# Patient Record
Sex: Male | Born: 2000 | Race: Black or African American | Hispanic: No | Marital: Single | Smoking: Never smoker
Health system: Southern US, Community
[De-identification: ages and names within clinical notes are randomized; demographics above are authoritative.]

## PROBLEM LIST (undated history)

## (undated) DIAGNOSIS — R519 Headache, unspecified: Secondary | ICD-10-CM

## (undated) DIAGNOSIS — T8859XA Other complications of anesthesia, initial encounter: Secondary | ICD-10-CM

## (undated) DIAGNOSIS — S022XXA Fracture of nasal bones, initial encounter for closed fracture: Secondary | ICD-10-CM

## (undated) DIAGNOSIS — Z8489 Family history of other specified conditions: Secondary | ICD-10-CM

## (undated) DIAGNOSIS — T7840XA Allergy, unspecified, initial encounter: Secondary | ICD-10-CM

## (undated) DIAGNOSIS — K200 Eosinophilic esophagitis: Secondary | ICD-10-CM

## (undated) DIAGNOSIS — F329 Major depressive disorder, single episode, unspecified: Secondary | ICD-10-CM

## (undated) DIAGNOSIS — R569 Unspecified convulsions: Secondary | ICD-10-CM

## (undated) DIAGNOSIS — J189 Pneumonia, unspecified organism: Secondary | ICD-10-CM

## (undated) DIAGNOSIS — J45909 Unspecified asthma, uncomplicated: Secondary | ICD-10-CM

## (undated) HISTORY — PX: TONSILLECTOMY: SUR1361

## (undated) HISTORY — PX: ADENOIDECTOMY: SUR15

---
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
## 2019-01-16 ENCOUNTER — Encounter (HOSPITAL_COMMUNITY): Payer: Self-pay | Admitting: Family Medicine

## 2019-01-16 ENCOUNTER — Other Ambulatory Visit: Payer: Self-pay

## 2019-01-16 DIAGNOSIS — Z79899 Other long term (current) drug therapy: Secondary | ICD-10-CM | POA: Insufficient documentation

## 2019-01-16 DIAGNOSIS — X503XXA Overexertion from repetitive movements, initial encounter: Secondary | ICD-10-CM | POA: Diagnosis not present

## 2019-01-16 DIAGNOSIS — S93401A Sprain of unspecified ligament of right ankle, initial encounter: Secondary | ICD-10-CM | POA: Insufficient documentation

## 2019-01-16 DIAGNOSIS — Y999 Unspecified external cause status: Secondary | ICD-10-CM | POA: Insufficient documentation

## 2019-01-16 DIAGNOSIS — J45909 Unspecified asthma, uncomplicated: Secondary | ICD-10-CM | POA: Diagnosis not present

## 2019-01-16 DIAGNOSIS — Y92310 Basketball court as the place of occurrence of the external cause: Secondary | ICD-10-CM | POA: Insufficient documentation

## 2019-01-16 DIAGNOSIS — Y9367 Activity, basketball: Secondary | ICD-10-CM | POA: Insufficient documentation

## 2019-01-16 DIAGNOSIS — S99911A Unspecified injury of right ankle, initial encounter: Secondary | ICD-10-CM | POA: Diagnosis present

## 2019-01-16 NOTE — ED Triage Notes (Signed)
Patient states he was playing basketball when he injured his right ankle. This occurred today. Reports numbness and tingling in the foot. Patient states he is ambulatory but it hurts.

## 2019-01-17 ENCOUNTER — Emergency Department (HOSPITAL_COMMUNITY)
Admission: EM | Admit: 2019-01-17 | Discharge: 2019-01-17 | Disposition: A | Discharge status: Home / Self Care | Payer: Commercial Managed Care - PPO | Attending: Emergency Medicine | Admitting: Emergency Medicine

## 2019-01-17 ENCOUNTER — Emergency Department (HOSPITAL_COMMUNITY): Payer: Commercial Managed Care - PPO

## 2019-01-17 DIAGNOSIS — S93401A Sprain of unspecified ligament of right ankle, initial encounter: Secondary | ICD-10-CM | POA: Diagnosis not present

## 2019-01-17 IMAGING — CR RIGHT ANKLE - COMPLETE 3+ VIEW
3 series · 3 of 3 positions shown · non-contrast
Comparison: None.

CLINICAL DATA: Right ankle pain and swelling after injury playing
basketball.

EXAM:
RIGHT ANKLE - COMPLETE 3+ VIEW

[x ankle ap right]
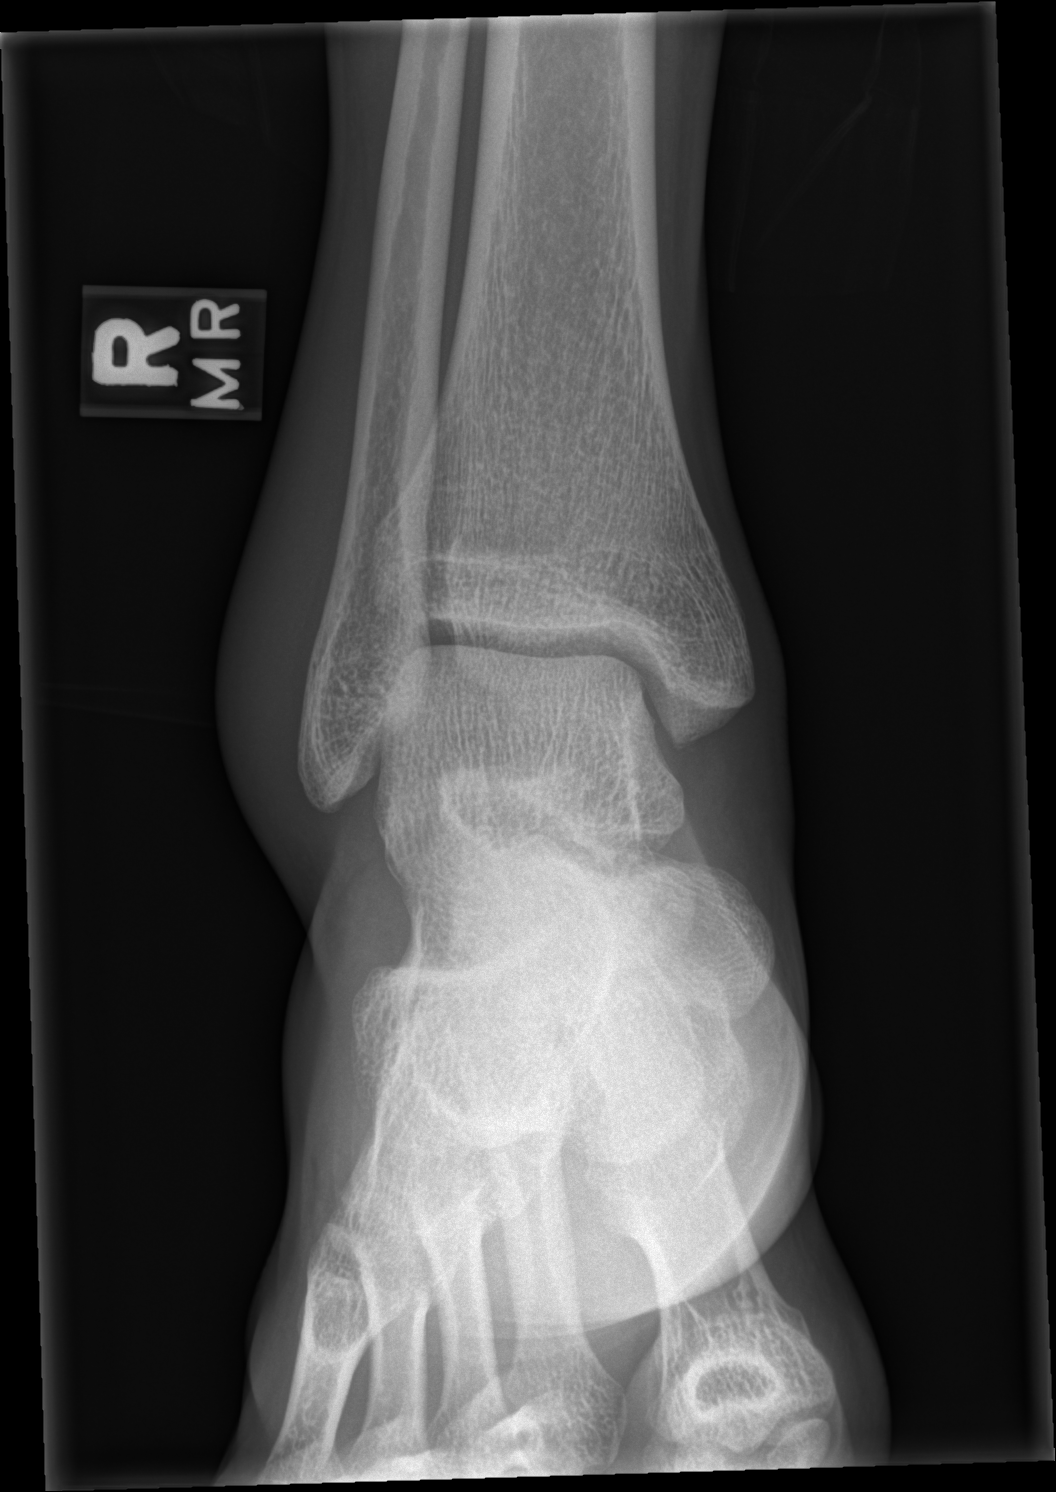

[x ankle obl right]
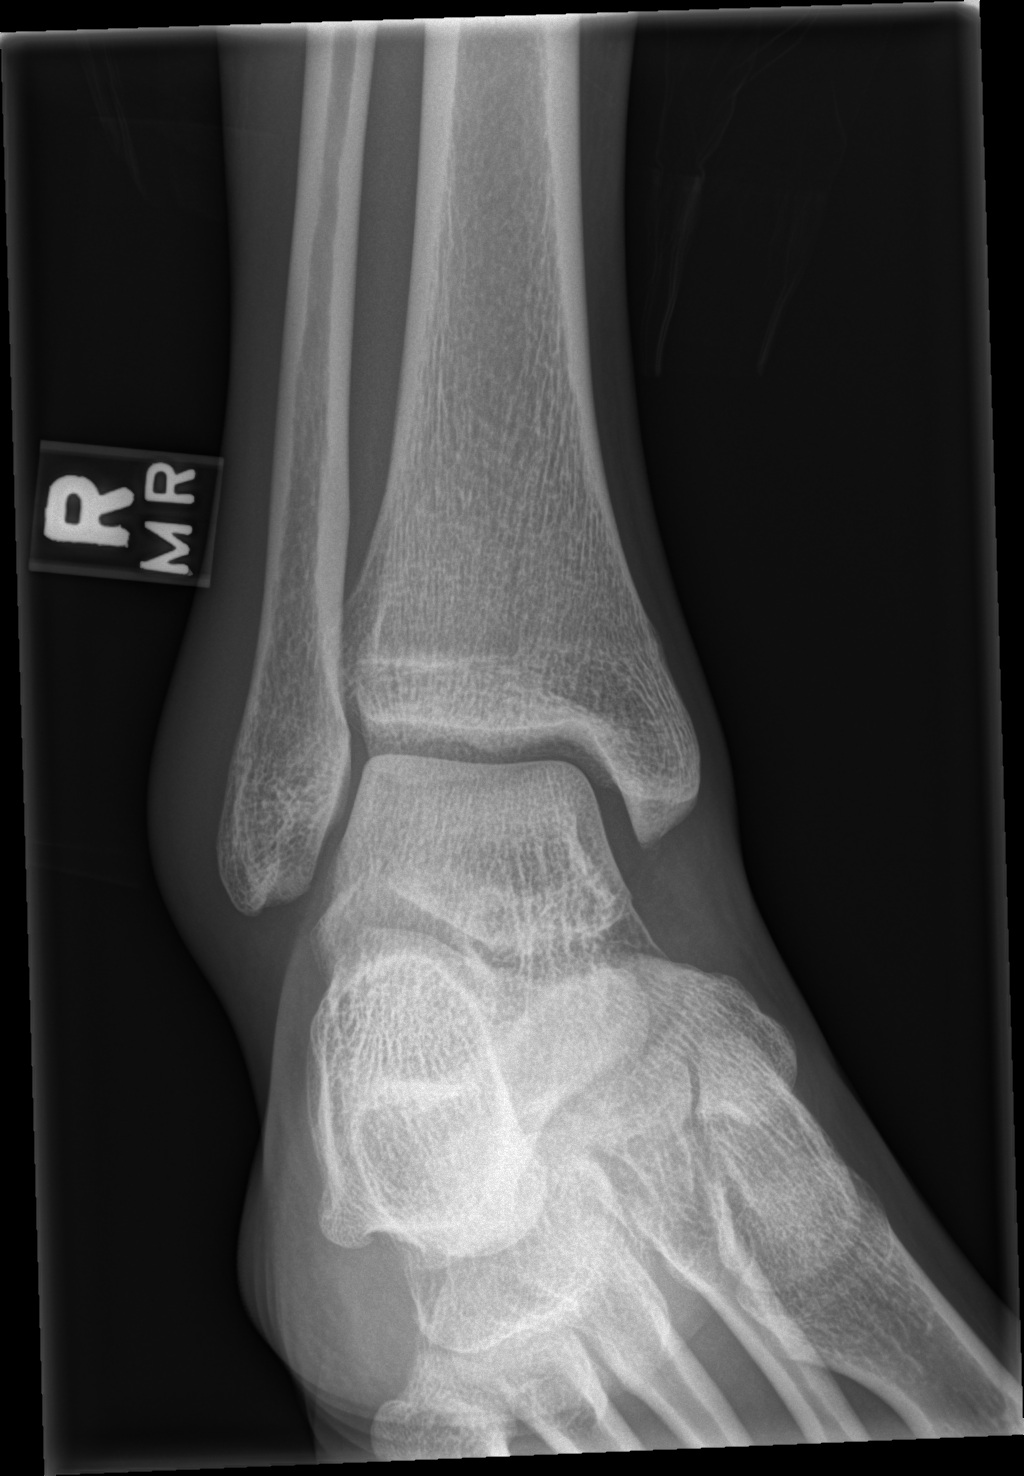

[x ankle lat right]
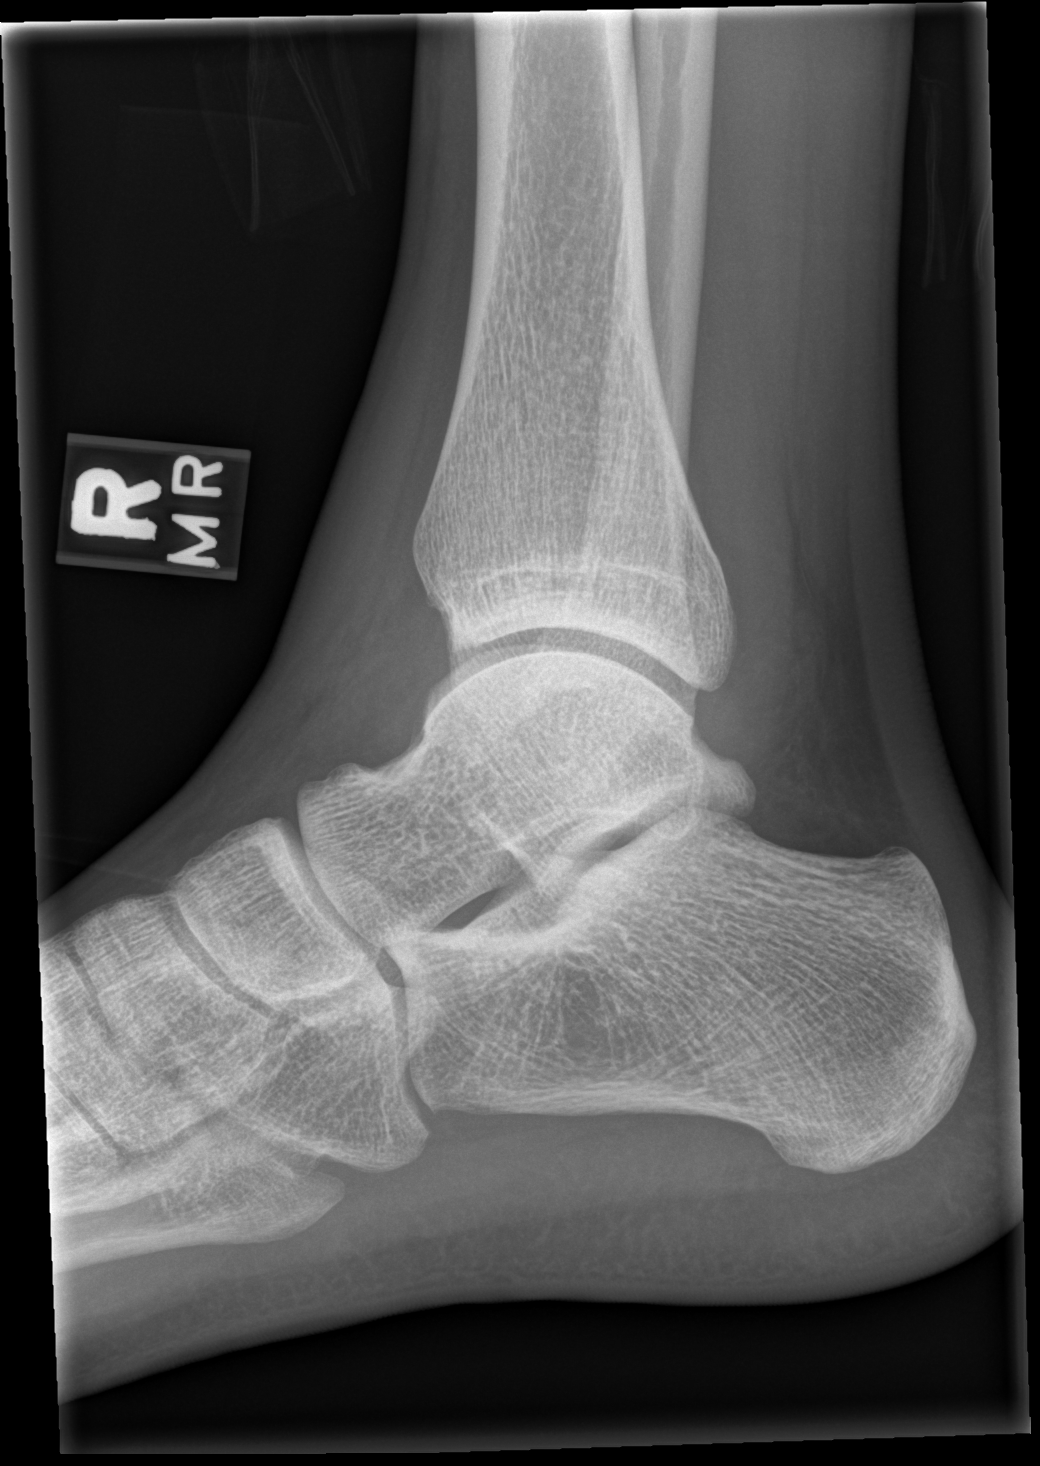

[3 of 3 positions shown; findings below may reference images not displayed]

FINDINGS: There is no evidence of fracture or dislocation. The ankle mortise
is preserved. There is no evidence of arthropathy or other focal
bone abnormality. No definite ankle joint effusion, vague soft
tissue fullness. Lateral soft tissue edema.
IMPRESSION: Lateral soft tissue edema. No fracture or subluxation.

## 2019-01-17 MED ORDER — IBUPROFEN 600 MG PO TABS: 600.0000 mg | ORAL_TABLET | Freq: Four times a day (QID) | ORAL | 0 refills | Status: DC | PRN

## 2019-01-17 NOTE — Discharge Instructions (Signed)
Take ibuprofen every 8 hours for pain and inflammation. Remain weight bearing as tolerated using crutches. Ice and elevate to reduce swelling.   If no better in one week, follow up with orthopedics.

## 2019-01-17 NOTE — ED Provider Notes (Signed)
[facility] DEPT Provider Note   CSN: [id number] Arrival date & time: 01/16/19  2230     History   Chief Complaint Chief Complaint  Patient presents with  . Ankle Injury    HPI Terrence Riley is a 18 y.o. male.     Patient to ED with c/o right ankle injury occurring last evening while playing basketball. He describes his ankle inverting while coming down from a jump. No other injury. Difficulty weight bearing since.   The history is provided by the patient. No language interpreter was used.  Ankle Injury    Past Medical History:  Diagnosis Date  . Allergy    rhinitis  . Asthma    as a child  . Eosinophilic esophagitis    heartburn and reflux  . Family history of adverse reaction to anesthesia    mother respiratory failure  . Nasal fracture    deviated septum  . Premature baby    2 months early  . Seizures ([place])    well controlled on meds/ one 2 months ago when meds were late    There are no active problems to display for this patient.   Past Surgical History:  Procedure Laterality Date  . ADENOIDECTOMY    . CLOSED REDUCTION NASAL FRACTURE N/A 08/31/2017   Procedure: CLOSED REDUCTION NASAL FRACTURE;  Surgeon: Clyde Canterbury, MD;  Location: [facility];  Service: ENT;  Laterality: N/A;  . SEPTOPLASTY N/A 08/31/2017   Procedure: SEPTOPLASTY;  Surgeon: Clyde Canterbury, MD;  Location: [facility];  Service: ENT;  Laterality: N/A;  . TONSILLECTOMY     and addenoids        Home Medications    Prior to Admission medications   Medication Sig Start Date End Date Taking? Authorizing Provider  albuterol (PROVENTIL HFA;VENTOLIN HFA) 108 (90 Base) MCG/ACT inhaler Inhale 2 puffs into the lungs every 4 (four) hours as needed for wheezing.  11/08/17   [provider]  albuterol (PROVENTIL) (2.5 MG/3ML) 0.083% nebulizer solution Take 3 mLs (2.5 mg total) by nebulization every 4 (four) hours as needed for wheezing or  shortness of breath. 54/00/86   Delora Fuel, MD  budesonide (PULMICORT) 0.25 MG/2ML nebulizer solution Take 0.25 mg by nebulization 2 (two) times daily. Am and pm    [provider]  cetirizine (ZYRTEC) 10 MG tablet Take 10 mg by mouth daily. am    [provider]  Cholecalciferol (VITAMIN D-1000 MAX ST) 25 MCG (1000 UT) tablet Take 1,000 Units by mouth daily.  06/13/18   [provider]  clonazePAM (KLONOPIN) 0.5 MG tablet Take 0.5 mg by mouth daily. bedtime    [provider]  diazepam (DIASAT) 20 MG GEL Place 20 mg rectally once as needed. seizures 09/14/16   [provider]  EPINEPHrine 0.3 mg/0.3 mL IJ SOAJ injection Inject 0.3 mg into the muscle once as needed. Allergic reaction    [provider]  levETIRAcetam (KEPPRA) 1000 MG tablet Take 1,000 mg by mouth 2 (two) times daily. Am and pm    [provider]  predniSONE (DELTASONE) 50 MG tablet Take 1 tablet (50 mg total) by mouth daily. 76/19/50   Delora Fuel, MD  topiramate (TOPAMAX) 200 MG tablet Take 200 mg by mouth 2 (two) times daily. Am and pm    [provider]  Vitamin D, Ergocalciferol, (DRISDOL) 50000 units CAPS capsule Take 50,000 Units by mouth every 7 (seven) days.    [provider]  Family History History reviewed. No pertinent family history.  Social History Social History   Tobacco Use  . Smoking status: Never Smoker  . Smokeless tobacco: Never Used  Substance Use Topics  . Alcohol use: No  . Drug use: No     Allergies   Lortab [hydrocodone-acetaminophen], Other, Zofran [ondansetron hcl], Citrus, Dairy aid [lactase], Flu virus vaccine, Soy allergy, Tylenol [acetaminophen], Eggs or egg-derived products, and Hydrocodone-acetaminophen   Review of Systems Review of Systems  Musculoskeletal:       See HPI.  Skin: Negative for wound.  Neurological: Negative for numbness.     Physical Exam Updated Vital Signs BP 113/68 (BP  Location: Left Arm)   Pulse 65   Temp 98.5 F (36.9 C) (Oral)   Resp 16   Ht 5\' 11"  (1.803 m)   Wt 61.2 kg   SpO2 98%   BMI 18.83 kg/m   Physical Exam Constitutional:      Appearance: He is well-developed.  Neck:     Musculoskeletal: Normal range of motion.  Pulmonary:     Effort: Pulmonary effort is normal.  Musculoskeletal: Normal range of motion.     Comments: Right ankle swollen laterally. Joint stable. Achilles intact. Distal pulses intact. FROM all digits of right foot. No calf, shin or knee tenderness.   Skin:    General: Skin is warm and dry.     Findings: No bruising.  Neurological:     Mental Status: He is alert and oriented to person, place, and time.     Sensory: No sensory deficit.      ED Treatments / Results  Labs (all labs ordered are listed, but only abnormal results are displayed) Labs Reviewed - No data to display  EKG    Radiology Dg Ankle Complete Right  Result Date: 01/17/2019 CLINICAL DATA:  Right ankle pain and swelling after injury playing basketball. EXAM: RIGHT ANKLE - COMPLETE 3+ VIEW COMPARISON:  None. FINDINGS: There is no evidence of fracture or dislocation. The ankle mortise is preserved. There is no evidence of arthropathy or other focal bone abnormality. No definite ankle joint effusion, vague soft tissue fullness. Lateral soft tissue edema. IMPRESSION: Lateral soft tissue edema. No fracture or subluxation. Electronically Signed   By: Keith Rake M.D.   On: 01/17/2019 01:14    Procedures Procedures (including critical care time)  Medications Ordered in ED Medications - No data to display   Initial Impression / Assessment and Plan / ED Course  I have reviewed the triage vital signs and the nursing notes.  Pertinent labs & imaging results that were available during my care of the patient were reviewed by me and considered in my medical decision making (see chart for details).        Patient to ED after inversion injury  to right ankle.   Imaging is negative for fracture supporting diagnosis of ankle sprain. ASO and crutches provided.   Final Clinical Impressions(s) / ED Diagnoses   Final diagnoses:  None   1. Right ankle sprain  ED Discharge Orders    None       Charlann Lange, PA-C 01/17/19 [place], Delice Bison, DO 01/17/19 [phone]

## 2019-02-18 ENCOUNTER — Emergency Department (HOSPITAL_COMMUNITY): Payer: Commercial Managed Care - PPO

## 2019-02-18 ENCOUNTER — Other Ambulatory Visit: Payer: Self-pay

## 2019-02-18 ENCOUNTER — Emergency Department (HOSPITAL_COMMUNITY)
Admission: EM | Admit: 2019-02-18 | Discharge: 2019-02-18 | Disposition: A | Discharge status: Home / Self Care | Payer: Commercial Managed Care - PPO | Attending: Emergency Medicine | Admitting: Emergency Medicine

## 2019-02-18 DIAGNOSIS — Y999 Unspecified external cause status: Secondary | ICD-10-CM | POA: Diagnosis not present

## 2019-02-18 DIAGNOSIS — Y92008 Other place in unspecified non-institutional (private) residence as the place of occurrence of the external cause: Secondary | ICD-10-CM | POA: Diagnosis not present

## 2019-02-18 DIAGNOSIS — Y93K9 Activity, other involving animal care: Secondary | ICD-10-CM | POA: Diagnosis not present

## 2019-02-18 DIAGNOSIS — S060X1A Concussion with loss of consciousness of 30 minutes or less, initial encounter: Secondary | ICD-10-CM | POA: Insufficient documentation

## 2019-02-18 DIAGNOSIS — Z79899 Other long term (current) drug therapy: Secondary | ICD-10-CM | POA: Diagnosis not present

## 2019-02-18 DIAGNOSIS — J45909 Unspecified asthma, uncomplicated: Secondary | ICD-10-CM | POA: Diagnosis not present

## 2019-02-18 DIAGNOSIS — S0990XA Unspecified injury of head, initial encounter: Secondary | ICD-10-CM | POA: Diagnosis present

## 2019-02-18 DIAGNOSIS — W01198A Fall on same level from slipping, tripping and stumbling with subsequent striking against other object, initial encounter: Secondary | ICD-10-CM | POA: Diagnosis not present

## 2019-02-18 LAB — CBG MONITORING, ED: Glucose-Capillary: 98 mg/dL (ref 70–99)

## 2019-02-18 IMAGING — CT CT HEAD WITHOUT CONTRAST
3 series · 15 of 47 positions shown, 18 images · non-contrast
Comparison: None.

CLINICAL DATA: Head trauma

EXAM:
CT HEAD WITHOUT CONTRAST
TECHNIQUE: Contiguous axial images were obtained from the base of the skull
through the vertex without intravenous contrast.

[Series 2: head wo · axial · 0.42mm/px · z∈[-138,-13]mm · 9 of 30 slices shown, 12 images]
[im 3/30  brain]
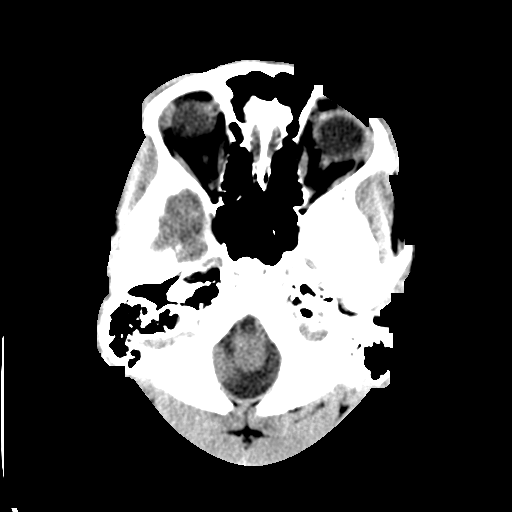
[im 3/30  bone]
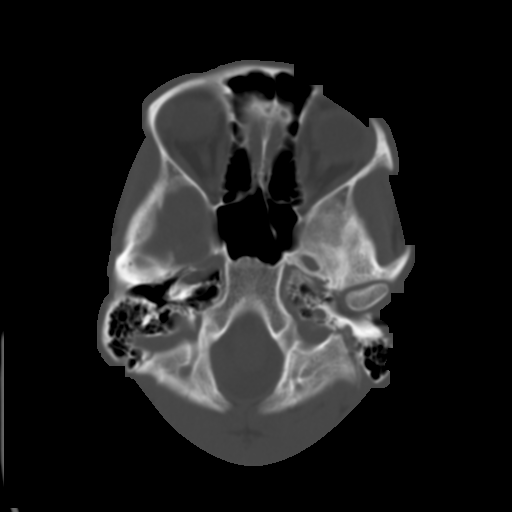
[im 6/30  brain]
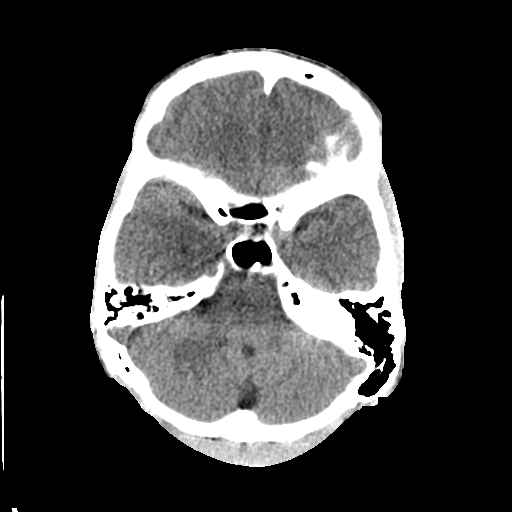
[im 9/30  brain]
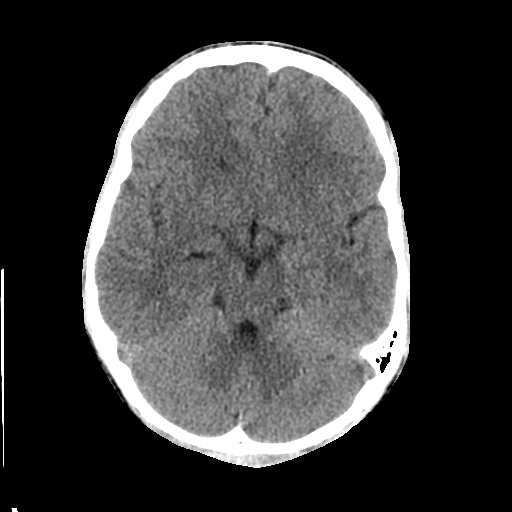
[im 12/30  brain]
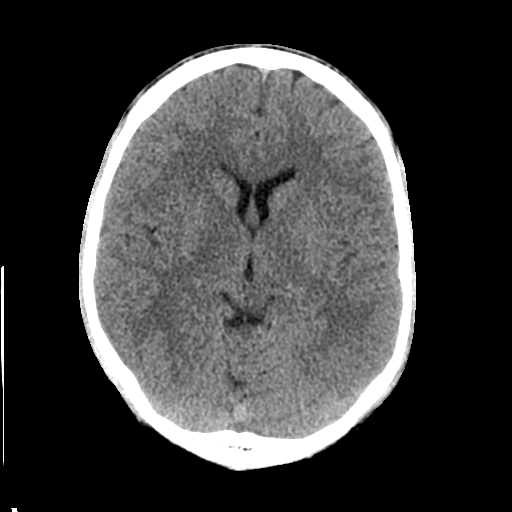
[im 16/30  brain]
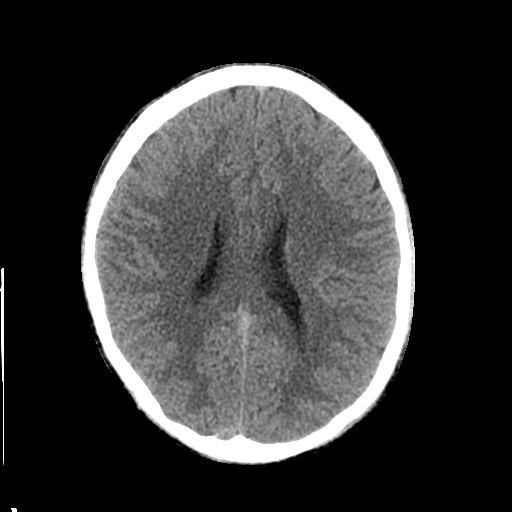
[im 16/30  bone]
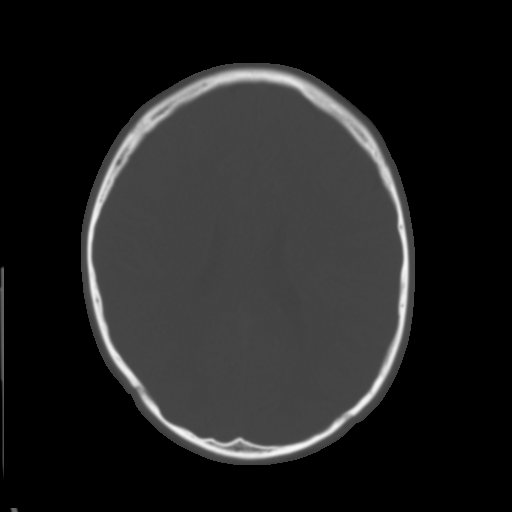
[im 19/30  brain]
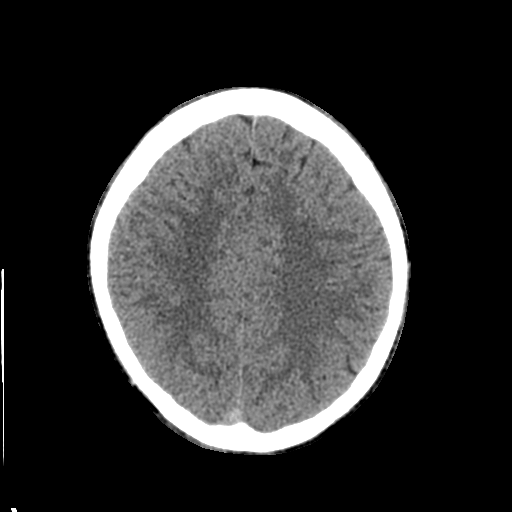
[im 22/30  brain]
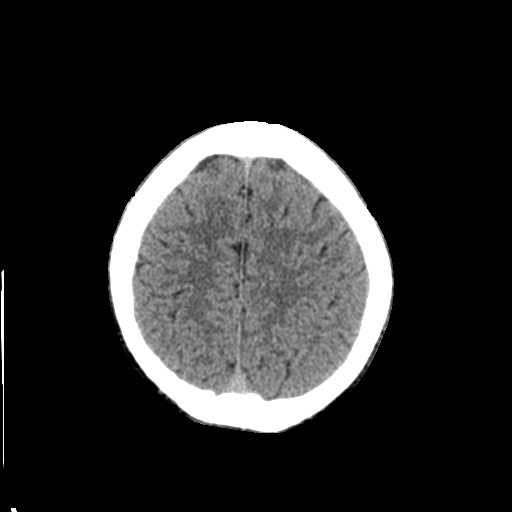
[im 25/30  brain]
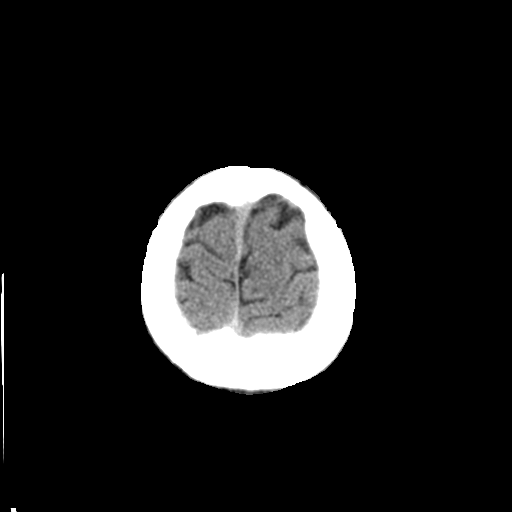
[im 28/30  brain]
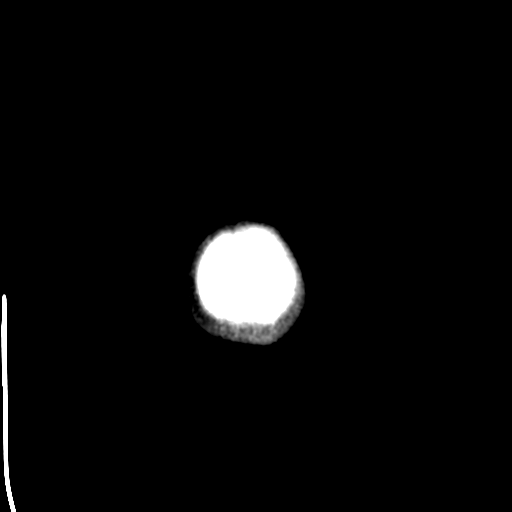
[im 28/30  bone]
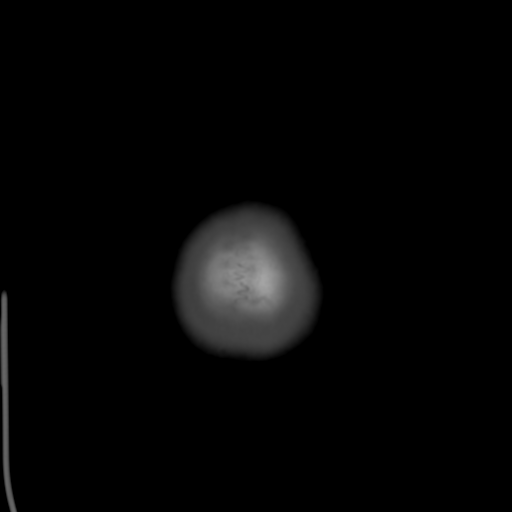

[Series 5: coronal soft tissue · coronal · 0.30mm/px · 3 of 69 slices shown]
[im 23/69  brain]
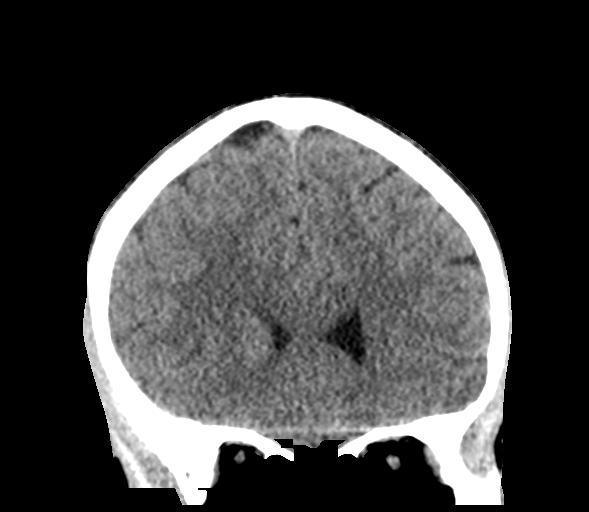
[im 31/69  brain]
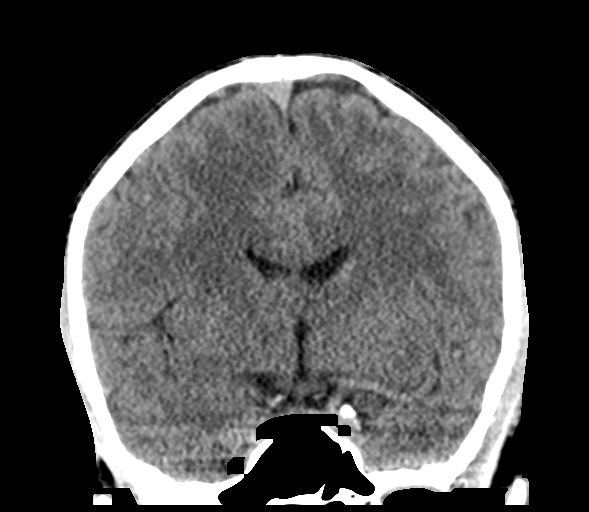
[im 38/69  brain]
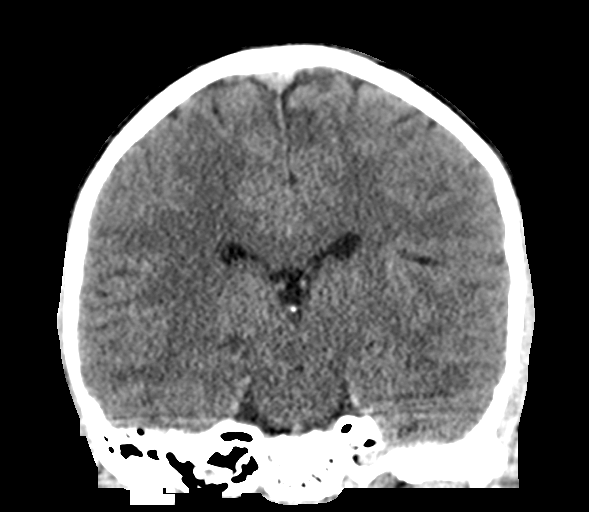

[Series 6: sagittal soft tissue · sagittal · 0.30mm/px · 3 of 59 slices shown]
[im 20/59  brain]
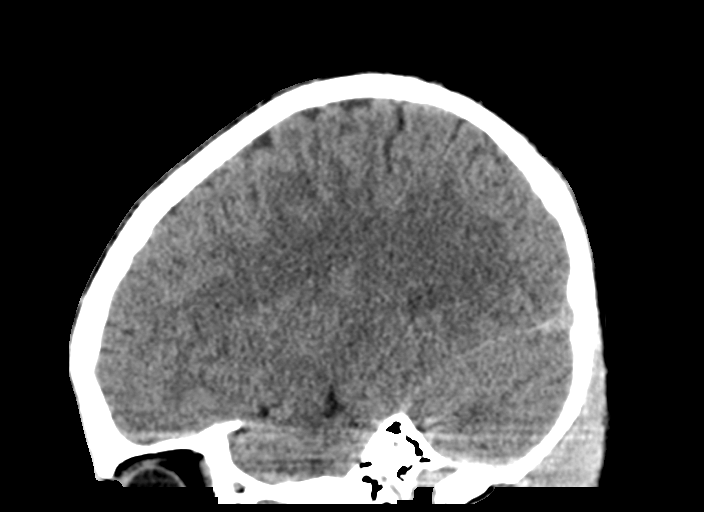
[im 30/59  brain]
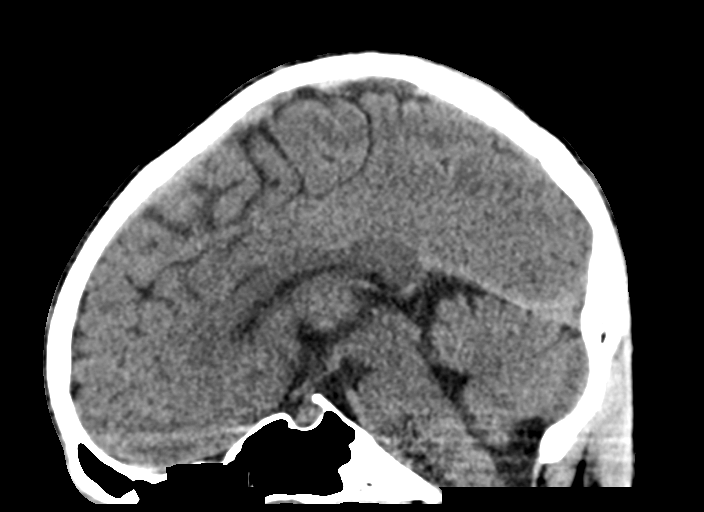
[im 39/59  brain]
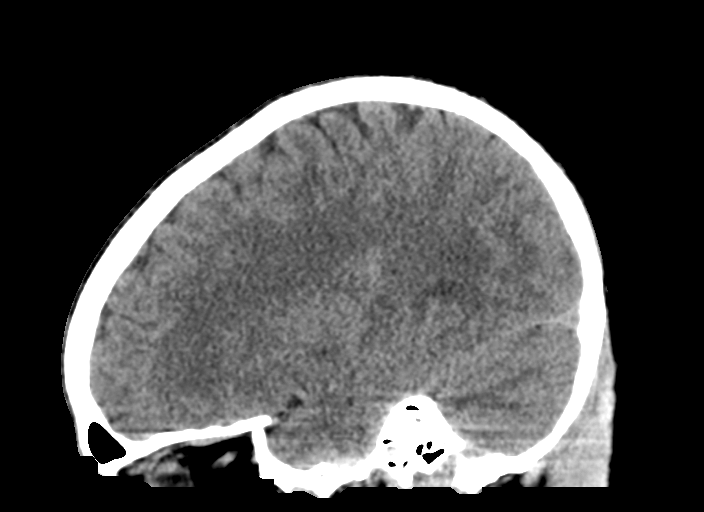

[15 of 47 positions shown; findings below may reference images not displayed]

FINDINGS: Brain: There is no mass, hemorrhage or extra-axial collection. The
size and configuration of the ventricles and extra-axial CSF spaces
are normal. The brain parenchyma is normal, without acute or chronic
infarction.

Vascular: No abnormal hyperdensity of the major intracranial
arteries or dural venous sinuses. No intracranial atherosclerosis.

Skull: The visualized skull base, calvarium and extracranial soft
tissues are normal.

Sinuses/Orbits: No fluid levels or advanced mucosal thickening of
the visualized paranasal sinuses. No mastoid or middle ear effusion.
The orbits are normal.
IMPRESSION: Normal brain.

## 2019-02-18 MED ORDER — ACETAMINOPHEN 500 MG PO TABS: 1000.0000 mg | ORAL_TABLET | Freq: Once | ORAL | Status: DC

## 2019-02-18 MED ORDER — SODIUM CHLORIDE 0.9 % IV BOLUS
1000.0000 mL | Freq: Once | INTRAVENOUS | Status: AC
  Administered 2019-02-18: 1000 mL via INTRAVENOUS

## 2019-02-18 MED ORDER — METOCLOPRAMIDE HCL 5 MG/ML IJ SOLN
10.0000 mg | Freq: Once | INTRAMUSCULAR | Status: AC
  Administered 2019-02-18: 10 mg via INTRAVENOUS
  Filled 2019-02-18: qty 2

## 2019-02-18 MED ORDER — IBUPROFEN 200 MG PO TABS
400.0000 mg | ORAL_TABLET | Freq: Once | ORAL | Status: AC
  Administered 2019-02-18: 400 mg via ORAL
  Filled 2019-02-18: qty 2

## 2019-02-18 NOTE — Discharge Instructions (Signed)
You had a normal neurologic exam while in the emergency department.  This is reassuring.  It is possible that you may have a mild concussion.  A concussion is a diagnosis that is made clinically and does not show any abnormal results on imaging such as a CT scan or MRI.  Your CT today was normal.  A concussion may cause a persistent headache over the next few days. This can be brought on or worsened by loud sounds or bright lights. Try to avoid excessive use of cell phones, television, video games as this may worsening headaches.  Avoid strenuous activity and heavy lifting over the next few days.  If you develop severe worsening of your headache, vision changes or loss, uncontrolled vomiting, numbness or tingling to one side of your body, difficulty walking or lifting your arms or legs, return promptly to the emergency department for repeat evaluation.

## 2019-02-18 NOTE — ED Triage Notes (Signed)
Patient fell in house and hit his head on the door frame. Patient is not at his normal GCS. Mom states she had to arouse him. Mom states that patient has been acting weird. Patient has been vomiting out in the lobby.

## 2019-02-18 NOTE — ED Provider Notes (Signed)
[facility] DEPT Provider Note   CSN: [id number] Arrival date & time: 02/18/19  0032    History   Chief Complaint Chief Complaint  Patient presents with   Fall    HPI Terrence Riley is a 18 y.o. male.     18 year old male presents to the emergency department for evaluation following a fall at home.  Reported to mother that he had been playing with his dogs when he tripped, falling into a door frame.  Mother found the patient unconscious on the ground on his right side.  No reported seizure like activity.  She was able to arouse him after a few minutes.  States that the patient did not appear to be himself.  Patient has now been acting increasingly giddy, which mother finds to be inappropriate for him.  He did not receive any medications prior to arrival.  Did develop nausea with one episode of vomiting in the parking lot.  Patient currently complaining of a headache with some bilateral blurry vision.  No extremity numbness or paresthesias.  The history is provided by the patient and a parent. No language interpreter was used.  Fall    Past Medical History:  Diagnosis Date   Allergy    rhinitis   Asthma    as a child   Eosinophilic esophagitis    heartburn and reflux   Family history of adverse reaction to anesthesia    mother respiratory failure   Nasal fracture    deviated septum   Premature baby    2 months early   Seizures ([place])    well controlled on meds/ one 2 months ago when meds were late    There are no active problems to display for this patient.   Past Surgical History:  Procedure Laterality Date   ADENOIDECTOMY     CLOSED REDUCTION NASAL FRACTURE N/A 08/31/2017   Procedure: CLOSED REDUCTION NASAL FRACTURE;  Surgeon: Clyde Canterbury, MD;  Location: [facility];  Service: ENT;  Laterality: N/A;   SEPTOPLASTY N/A 08/31/2017   Procedure: SEPTOPLASTY;  Surgeon: Clyde Canterbury, MD;  Location: [facility];   Service: ENT;  Laterality: N/A;   TONSILLECTOMY     and addenoids        Home Medications    Prior to Admission medications   Medication Sig Start Date End Date Taking? Authorizing Provider  budesonide (PULMICORT) 0.25 MG/2ML nebulizer solution Take 0.25 mg by nebulization 2 (two) times daily as needed. Wheezing   Yes [provider]  cetirizine (ZYRTEC) 10 MG tablet Take 10 mg by mouth daily.    Yes [provider]  Cholecalciferol (VITAMIN D-1000 MAX ST) 25 MCG (1000 UT) tablet Take 1,000 Units by mouth daily.  06/13/18  Yes [provider]  clonazePAM (KLONOPIN) 0.5 MG tablet Take 0.5 mg by mouth daily. bedtime   Yes [provider]  levETIRAcetam (KEPPRA) 1000 MG tablet Take 1,000 mg by mouth 2 (two) times daily. Am and pm   Yes [provider]  midazolam (VERSED) 5 MG/ML injection Place 2 mLs into the nose every 5 (five) minutes as needed (seizures). Draw up prescribed dose (ml) in syringe, remove blue vial access device, then attach syringe to nasal atomizer for intranasal administration.    Yes [provider]  PRESCRIPTION MEDICATION Apply 1 application topically 3 (three) times daily as needed. Nausea - Phenergan Cream-   Yes [provider]  topiramate (TOPAMAX) 200 MG tablet Take 200 mg by  mouth 2 (two) times daily.    Yes [provider]  albuterol (PROVENTIL HFA;VENTOLIN HFA) 108 (90 Base) MCG/ACT inhaler Inhale 2 puffs into the lungs every 4 (four) hours as needed for wheezing.  11/08/17   [provider]  albuterol (PROVENTIL) (2.5 MG/3ML) 0.083% nebulizer solution Take 3 mLs (2.5 mg total) by nebulization every 4 (four) hours as needed for wheezing or shortness of breath. 51/76/16   Delora Fuel, MD  diazepam (DIASAT) 20 MG GEL Place 20 mg rectally once as needed. seizures 09/14/16   [provider]  EPINEPHrine 0.3 mg/0.3 mL IJ SOAJ injection Inject 0.3 mg into the muscle once as needed  for anaphylaxis. Allergic reaction    [provider]  ibuprofen (ADVIL) 600 MG tablet Take 1 tablet (600 mg total) by mouth every 6 (six) hours as needed. Patient not taking: Reported on 02/18/2019 01/17/19   Charlann Lange, PA-C  predniSONE (DELTASONE) 50 MG tablet Take 1 tablet (50 mg total) by mouth daily. Patient not taking: Reported on 0/73/7106 26/94/85   Delora Fuel, MD    Family History No family history on file.  Social History Social History   Tobacco Use   Smoking status: Never Smoker   Smokeless tobacco: Never Used  Substance Use Topics   Alcohol use: No   Drug use: No     Allergies   Lortab [hydrocodone-acetaminophen], Other, Zofran [ondansetron hcl], Citrus, Dairy aid [lactase], Flu virus vaccine, Soy allergy, Tylenol [acetaminophen], Eggs or egg-derived products, and Hydrocodone-acetaminophen   Review of Systems Review of Systems Ten systems reviewed and are negative for acute change, except as noted in the HPI.    Physical Exam Updated Vital Signs BP 121/71 (BP Location: Right Arm)    Pulse 63    Temp 98.6 F (37 C) (Oral)    Resp 15    Ht 6' (1.829 m)    Wt 59 kg    SpO2 100%    BMI 17.63 kg/m   Physical Exam Vitals signs and nursing note reviewed.  Constitutional:      General: He is not in acute distress.    Appearance: He is well-developed. He is not diaphoretic.     Comments: Nontoxic appearing and in NAD  HENT:     Head: Normocephalic and atraumatic.     Comments: Mild tenderness to the right parietal scalp without frank hematoma or contusion.  No battle sign or raccoon's eyes.    Right Ear: External ear normal.     Left Ear: External ear normal.     Mouth/Throat:     Mouth: Mucous membranes are moist.  Eyes:     General: No scleral icterus.    Extraocular Movements: Extraocular movements intact.     Conjunctiva/sclera: Conjunctivae normal.     Pupils: Pupils are equal, round, and reactive to light.     Comments: No nystagmus    Neck:     Musculoskeletal: Normal range of motion.  Pulmonary:     Effort: Pulmonary effort is normal. No respiratory distress.     Comments: Respirations even and unlabored Musculoskeletal: Normal range of motion.  Skin:    General: Skin is warm and dry.     Coloration: Skin is not pale.     Findings: No erythema or rash.  Neurological:     General: No focal deficit present.     Mental Status: He is alert and oriented to person, place, and time.     Cranial Nerves: No cranial nerve  deficit.     Coordination: Coordination normal.     Comments: GCS 15. Speech is goal oriented. No cranial nerve deficits appreciated; symmetric eyebrow raise, no facial drooping, tongue midline. Patient has equal grip strength bilaterally with 5/5 strength against resistance in all major muscle groups bilaterally. Sensation to light touch intact. Patient moves extremities without ataxia. Patient ambulatory with steady gait.  Psychiatric:        Behavior: Behavior normal.      ED Treatments / Results  Labs (all labs ordered are listed, but only abnormal results are displayed) Labs Reviewed  CBG MONITORING, ED    EKG None  Radiology Ct Head Wo Contrast  Result Date: 02/18/2019 CLINICAL DATA:  Head trauma EXAM: CT HEAD WITHOUT CONTRAST TECHNIQUE: Contiguous axial images were obtained from the base of the skull through the vertex without intravenous contrast. COMPARISON:  None. FINDINGS: Brain: There is no mass, hemorrhage or extra-axial collection. The size and configuration of the ventricles and extra-axial CSF spaces are normal. The brain parenchyma is normal, without acute or chronic infarction. Vascular: No abnormal hyperdensity of the major intracranial arteries or dural venous sinuses. No intracranial atherosclerosis. Skull: The visualized skull base, calvarium and extracranial soft tissues are normal. Sinuses/Orbits: No fluid levels or advanced mucosal thickening of the visualized paranasal  sinuses. No mastoid or middle ear effusion. The orbits are normal. IMPRESSION: Normal brain. Electronically Signed   By: Ulyses Jarred M.D.   On: 02/18/2019 02:16    Procedures Procedures (including critical care time)  Medications Ordered in ED Medications  metoCLOPramide (REGLAN) injection 10 mg (10 mg Intravenous Given 02/18/19 0213)  sodium chloride 0.9 % bolus 1,000 mL (0 mLs Intravenous Stopped 02/18/19 0330)  ibuprofen (ADVIL) tablet 400 mg (400 mg Oral Given 02/18/19 0330)     Initial Impression / Assessment and Plan / ED Course  I have reviewed the triage vital signs and the nursing notes.  Pertinent labs & imaging results that were available during my care of the patient were reviewed by me and considered in my medical decision making (see chart for details).        18 year old male presents to the emergency department for evaluation of head injury.  Was playing with his dogs when he tripped and fell in the doorway.  Had loss of consciousness which lasted a few minutes before he was able to be awoken by his mother.  Presents to the ED with GCS of 15.  Neurologic exam nonfocal.  No evidence of severe head injury on exam.  Given history, he did undergo CT head which is negative for acute intracranial process.  Suspect degree of concussion which can be managed supportively and reassessed by the patient's pediatrician/primary doctor.  Return precautions discussed and provided.  Patient discharged in stable condition.  Patient and mother with no unaddressed concerns.   Final Clinical Impressions(s) / ED Diagnoses   Final diagnoses:  Concussion with loss of consciousness of 30 minutes or less, initial encounter    ED Discharge Orders    None       Antonietta Breach, PA-C 02/18/19 0353    Fatima Blank, MD 02/19/19 2010

## 2019-04-06 ENCOUNTER — Other Ambulatory Visit: Payer: Self-pay

## 2019-04-06 ENCOUNTER — Emergency Department (HOSPITAL_COMMUNITY)
Admission: EM | Admit: 2019-04-06 | Discharge: 2019-04-06 | Disposition: A | Discharge status: Home / Self Care | Payer: Commercial Managed Care - PPO | Attending: Emergency Medicine | Admitting: Emergency Medicine

## 2019-04-06 ENCOUNTER — Encounter (HOSPITAL_COMMUNITY): Payer: Self-pay | Admitting: Emergency Medicine

## 2019-04-06 ENCOUNTER — Emergency Department (HOSPITAL_COMMUNITY): Payer: Commercial Managed Care - PPO

## 2019-04-06 DIAGNOSIS — X58XXXA Exposure to other specified factors, initial encounter: Secondary | ICD-10-CM | POA: Diagnosis not present

## 2019-04-06 DIAGNOSIS — J45909 Unspecified asthma, uncomplicated: Secondary | ICD-10-CM | POA: Insufficient documentation

## 2019-04-06 DIAGNOSIS — Z79899 Other long term (current) drug therapy: Secondary | ICD-10-CM | POA: Diagnosis not present

## 2019-04-06 DIAGNOSIS — M25512 Pain in left shoulder: Secondary | ICD-10-CM | POA: Insufficient documentation

## 2019-04-06 DIAGNOSIS — Y9389 Activity, other specified: Secondary | ICD-10-CM | POA: Insufficient documentation

## 2019-04-06 IMAGING — CR DG SHOULDER 2+V*L*
3 series · 3 of 3 positions shown · non-contrast
Comparison: None.

CLINICAL DATA: Fall, pain

EXAM:
LEFT SHOULDER - 2+ VIEW

[w shoulder external left]
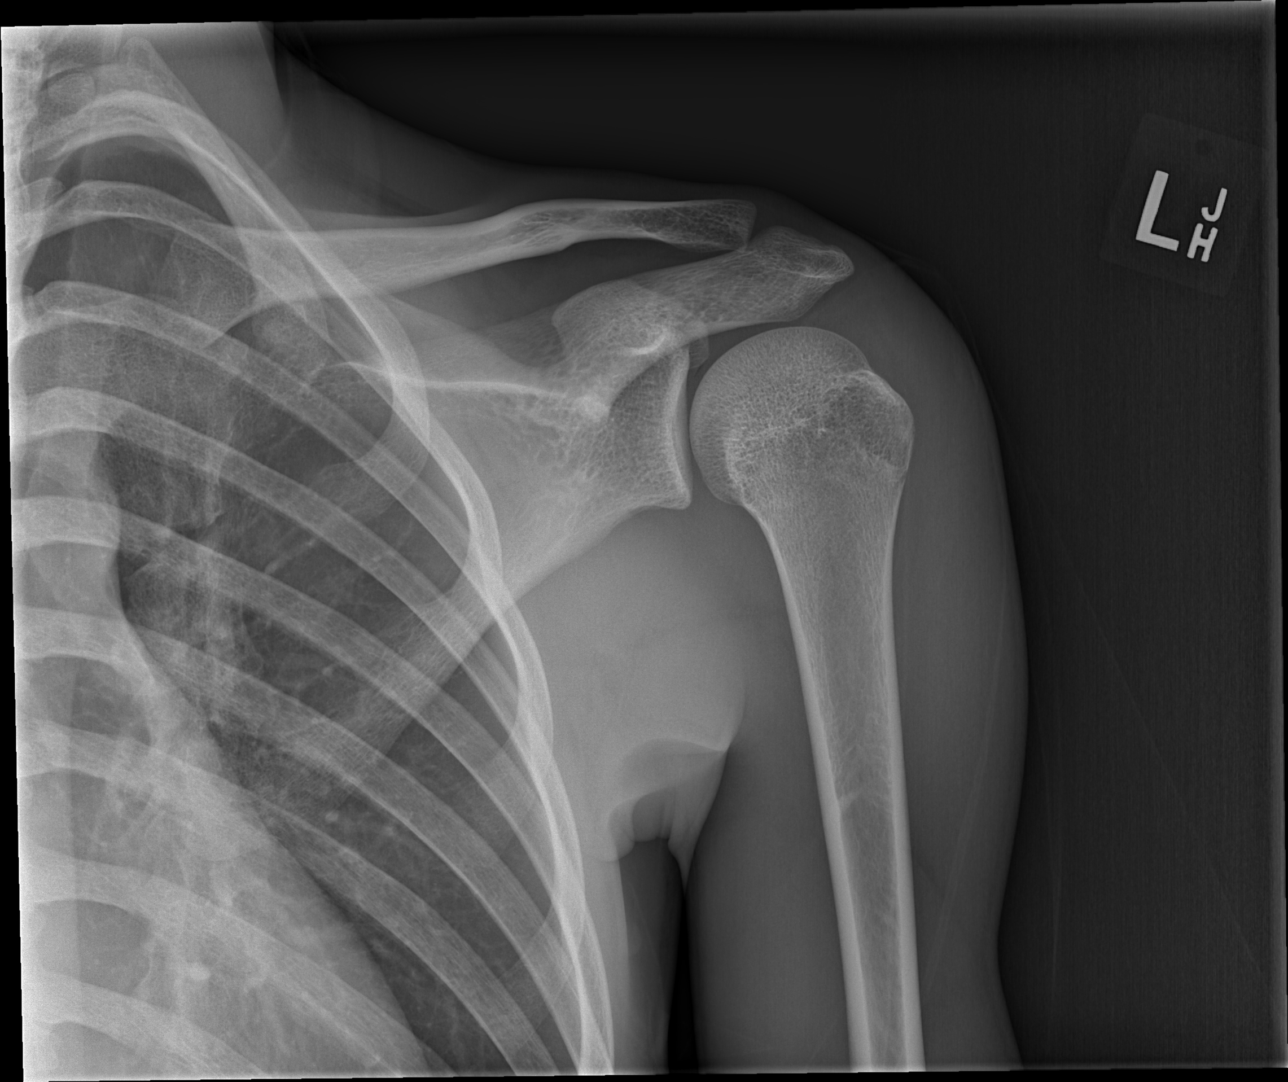

[w shoulder y-view left]
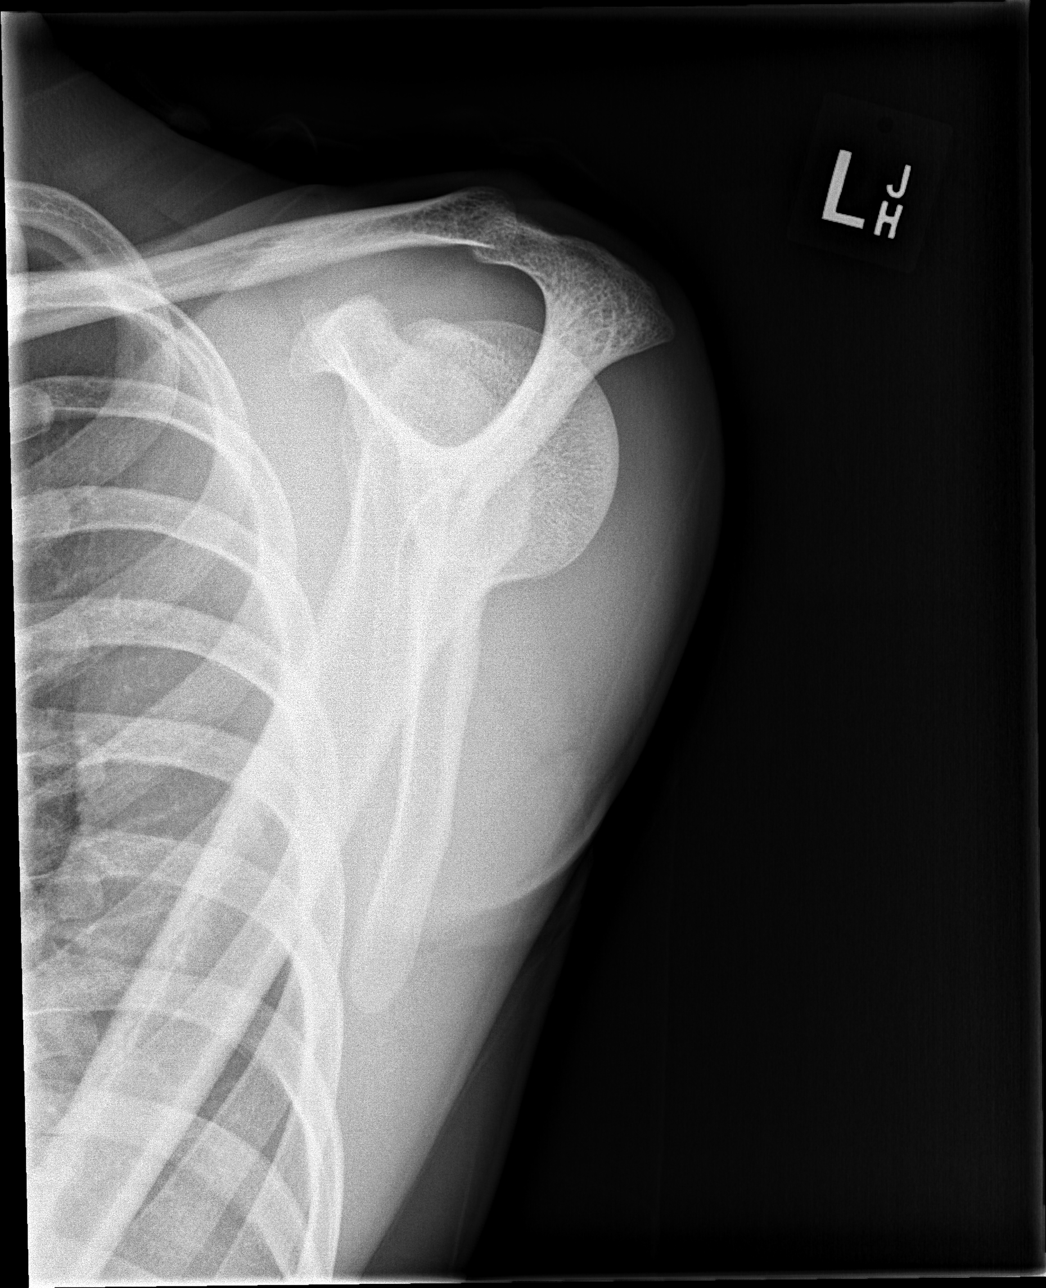

[x shoulder axillary left]
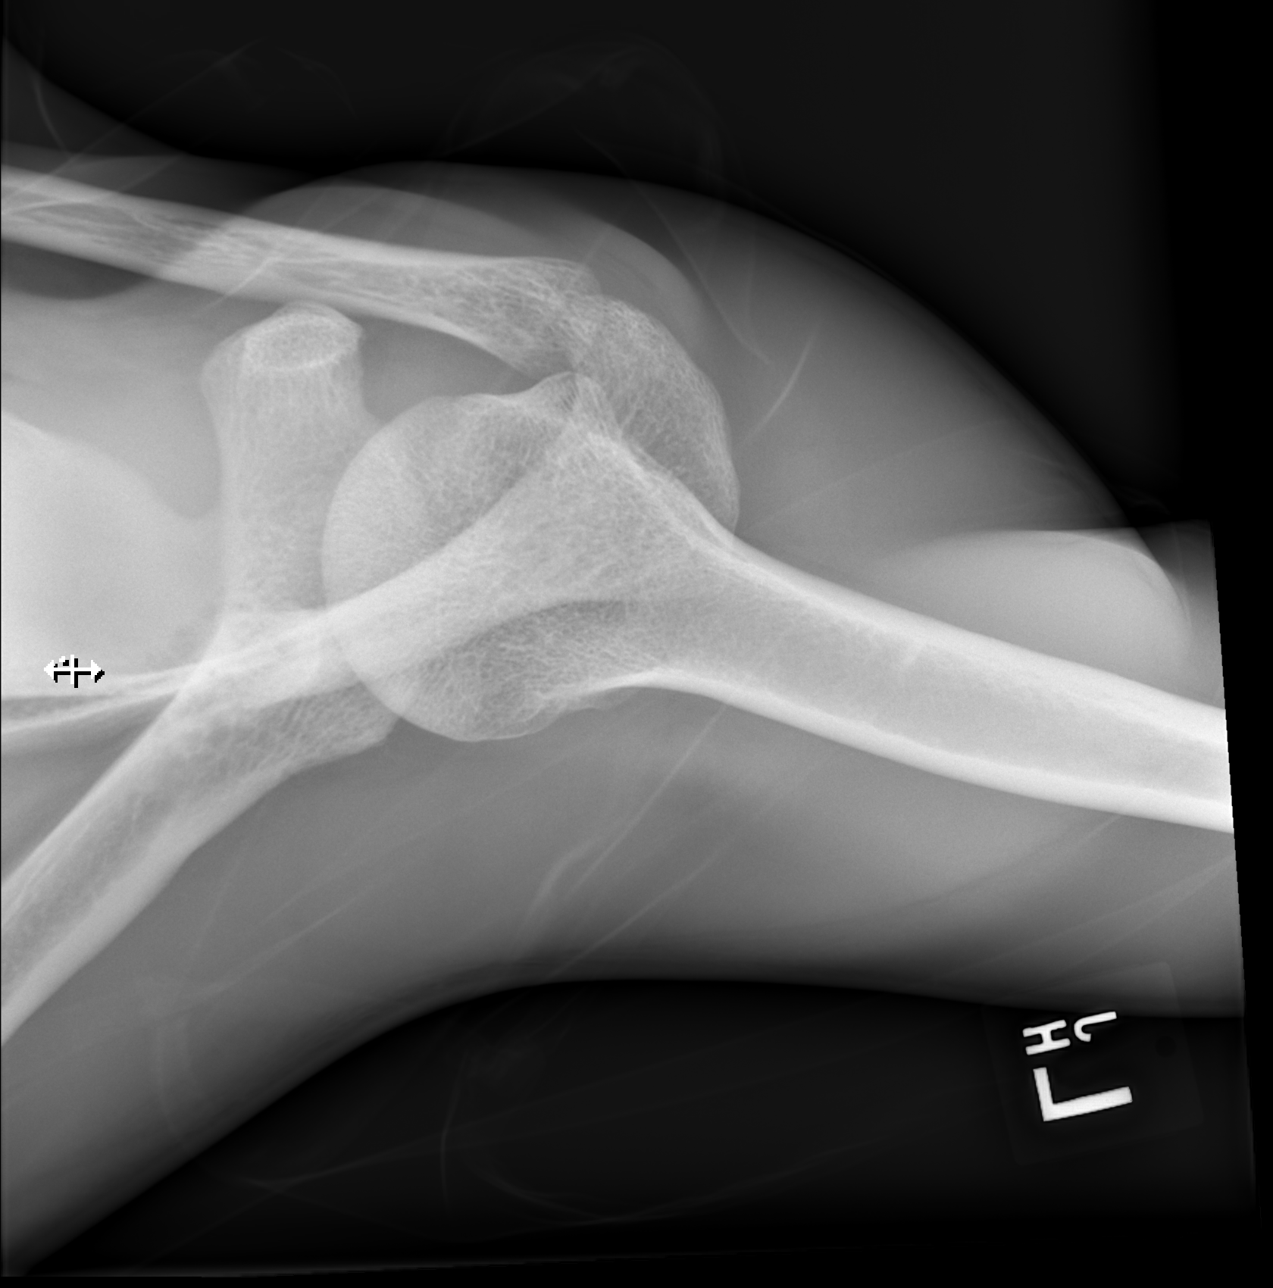

[3 of 3 positions shown; findings below may reference images not displayed]

FINDINGS: No fracture or dislocation of the left shoulder. The joint spaces
are well preserved. There may be a small Hill-Sachs deformity of the
left humeral head. No obvious bony Bankart fracture of the glenoid.
The partially imaged left chest is unremarkable.
IMPRESSION: No fracture or dislocation of the left shoulder. The joint spaces
are well preserved. There may be a small Hill-Sachs deformity of the
left humeral head. No obvious bony Bankart fracture of the glenoid.

## 2019-04-06 MED ORDER — NAPROXEN 500 MG PO TABS: 500.0000 mg | ORAL_TABLET | Freq: Two times a day (BID) | ORAL | 0 refills | Status: DC

## 2019-04-06 NOTE — Discharge Instructions (Signed)
You were seen in the emergency department today for left shoulder pain.  Your x-ray showed a small bony abnormality, but did not appear obviously fractured or dislocated.  We have placed you in a sling immobilizer, please wear this at all times for comfort, please try to move the shoulder somewhat to avoid frozen shoulder.  We are sending you home with naproxen to help with pain.  - Naproxen is a nonsteroidal anti-inflammatory medication that will help with pain and swelling. Be sure to take this medication as prescribed with food, 1 pill every 12 hours,  It should be taken with food, as it can cause stomach upset, and more seriously, stomach bleeding. Do not take other nonsteroidal anti-inflammatory medications with this such as Advil, Motrin, Aleve, Mobic, Goodie Powder, or Motrin.    You make take Tylenol per over the counter dosing with these medications.   We have prescribed you new medication(s) today. Discuss the medications prescribed today with your pharmacist as they can have adverse effects and interactions with your other medicines including over the counter and prescribed medications. Seek medical evaluation if you start to experience new or abnormal symptoms after taking one of these medicines, seek care immediately if you start to experience difficulty breathing, feeling of your throat closing, facial swelling, or rash as these could be indications of a more serious allergic reaction   Please call the orthopedic surgeons office for an appointment within the next 3 to 5 days.  Return to the ER for new or worsening symptoms or any other concerns.

## 2019-04-06 NOTE — ED Provider Notes (Signed)
[facility] DEPT Provider Note   CSN: [id number] Arrival date & time: 04/06/19  1925     History   Chief Complaint Chief Complaint  Patient presents with  . Shoulder Pain    HPI Terrence Riley is a 18 y.o. male with a hx of asthma & seizures who presents to the ED w/ complaints of L shoulder pain s/p seizure 4 weeks ago. Patient has hx of seizures, had one & believes he injured the shoulder 4 weeks ago- he called his neurologist who is aware of most recent seizure.  He states he has had fairly constant pain to the left shoulder, worse with movement, no alleviating factors.  Tried Motrin without relief.  He occasionally gets a popping sensation.  Denies numbness, tingling, weakness, fever, chills, or redness to the joint.  Patient is right-hand dominant.     HPI  Past Medical History:  Diagnosis Date  . Allergy    rhinitis  . Asthma    as a child  . Eosinophilic esophagitis    heartburn and reflux  . Family history of adverse reaction to anesthesia    mother respiratory failure  . Nasal fracture    deviated septum  . Premature baby    2 months early  . Seizures ([place])    well controlled on meds/ one 2 months ago when meds were late    There are no active problems to display for this patient.   Past Surgical History:  Procedure Laterality Date  . ADENOIDECTOMY    . CLOSED REDUCTION NASAL FRACTURE N/A 08/31/2017   Procedure: CLOSED REDUCTION NASAL FRACTURE;  Surgeon: Clyde Canterbury, MD;  Location: [facility];  Service: ENT;  Laterality: N/A;  . SEPTOPLASTY N/A 08/31/2017   Procedure: SEPTOPLASTY;  Surgeon: Clyde Canterbury, MD;  Location: [facility];  Service: ENT;  Laterality: N/A;  . TONSILLECTOMY     and addenoids        Home Medications    Prior to Admission medications   Medication Sig Start Date End Date Taking? Authorizing Provider  albuterol (PROVENTIL HFA;VENTOLIN HFA) 108 (90 Base) MCG/ACT inhaler Inhale 2  puffs into the lungs every 4 (four) hours as needed for wheezing.  11/08/17   [provider]  albuterol (PROVENTIL) (2.5 MG/3ML) 0.083% nebulizer solution Take 3 mLs (2.5 mg total) by nebulization every 4 (four) hours as needed for wheezing or shortness of breath. [id number]   Delora Fuel, MD  budesonide (PULMICORT) 0.25 MG/2ML nebulizer solution Take 0.25 mg by nebulization 2 (two) times daily as needed. Wheezing    [provider]  cetirizine (ZYRTEC) 10 MG tablet Take 10 mg by mouth daily.     [provider]  Cholecalciferol (VITAMIN D-1000 MAX ST) 25 MCG (1000 UT) tablet Take 1,000 Units by mouth daily.  06/13/18   [provider]  clonazePAM (KLONOPIN) 0.5 MG tablet Take 0.5 mg by mouth daily. bedtime    [provider]  diazepam (DIASAT) 20 MG GEL Place 20 mg rectally once as needed. seizures 09/14/16   [provider]  EPINEPHrine 0.3 mg/0.3 mL IJ SOAJ injection Inject 0.3 mg into the muscle once as needed for anaphylaxis. Allergic reaction    [provider]  ibuprofen (ADVIL) 600 MG tablet Take 1 tablet (600 mg total) by mouth every 6 (six) hours as needed. Patient not taking: Reported on 02/18/2019 01/17/19   Charlann Lange, PA-C  levETIRAcetam (KEPPRA) 1000 MG tablet Take 1,000 mg by mouth  2 (two) times daily. Am and pm    [provider]  midazolam (VERSED) 5 MG/ML injection Place 2 mLs into the nose every 5 (five) minutes as needed (seizures). Draw up prescribed dose (ml) in syringe, remove blue vial access device, then attach syringe to nasal atomizer for intranasal administration.     [provider]  predniSONE (DELTASONE) 50 MG tablet Take 1 tablet (50 mg total) by mouth daily. Patient not taking: Reported on 0000000 [id number]   Delora Fuel, MD  PRESCRIPTION MEDICATION Apply 1 application topically 3 (three) times daily as needed. Nausea - Phenergan Cream-    [provider]  topiramate (TOPAMAX)  200 MG tablet Take 200 mg by mouth 2 (two) times daily.     [provider]    Family History No family history on file.  Social History Social History   Tobacco Use  . Smoking status: Never Smoker  . Smokeless tobacco: Never Used  Substance Use Topics  . Alcohol use: No  . Drug use: No     Allergies   Lortab [hydrocodone-acetaminophen], Other, Zofran [ondansetron hcl], Citrus, Dairy aid [lactase], Flu virus vaccine, Soy allergy, Tylenol [acetaminophen], Eggs or egg-derived products, and Hydrocodone-acetaminophen   Review of Systems Review of Systems  Constitutional: Negative for chills and fever.  Respiratory: Negative for shortness of breath.   Cardiovascular: Negative for chest pain.  Gastrointestinal: Negative for nausea and vomiting.  Musculoskeletal: Positive for arthralgias.  Neurological: Negative for weakness and numbness.     Physical Exam Updated Vital Signs BP 110/85 (BP Location: Right Arm)   Pulse 70   Temp 98.2 F (36.8 C) (Oral)   Resp 16   Ht 6' (1.829 m)   Wt 61.2 kg   SpO2 100%   BMI 18.31 kg/m   Physical Exam Vitals signs and nursing note reviewed.  Constitutional:      General: He is not in acute distress.    Appearance: Normal appearance. He is not ill-appearing or toxic-appearing.  HENT:     Head: Normocephalic and atraumatic.  Neck:     Musculoskeletal: Normal range of motion and neck supple.     Comments: No midline tenderness.  Cardiovascular:     Rate and Rhythm: Normal rate and regular rhythm.     Pulses:          Radial pulses are 2+ on the right side and 2+ on the left side.  Pulmonary:     Effort: Pulmonary effort is normal. No respiratory distress.     Breath sounds: Normal breath sounds.  Abdominal:     General: There is no distension.     Palpations: Abdomen is soft.     Tenderness: There is no abdominal tenderness.  Musculoskeletal:     Comments: Upper extremities: No obvious deformity, appreciable  swelling, edema, erythema, ecchymosis, warmth, or open wounds. Patient has intact AROM throughout-with exception of limitation in left shoulder flexion/abduction, able to actively range slightly past 90 degrees with each of these motions.  Tender to palpation to the anterior glenohumeral joint.  Upper extremities are otherwise nontender..   Skin:    General: Skin is warm and dry.     Capillary Refill: Capillary refill takes less than 2 seconds.  Neurological:     Mental Status: He is alert.     Comments: Alert. Clear speech. Sensation grossly intact to bilateral upper extremities. 5/5 symmetric grip strength. Ambulatory.   Psychiatric:        Mood and Affect:  Mood normal.        Behavior: Behavior normal.      ED Treatments / Results  Labs (all labs ordered are listed, but only abnormal results are displayed) Labs Reviewed - No data to display  EKG None  Radiology Dg Shoulder Left  Result Date: 04/06/2019 CLINICAL DATA:  Fall, pain EXAM: LEFT SHOULDER - 2+ VIEW COMPARISON:  None. FINDINGS: No fracture or dislocation of the left shoulder. The joint spaces are well preserved. There may be a small Hill-Sachs deformity of the left humeral head. No obvious bony Bankart fracture of the glenoid. The partially imaged left chest is unremarkable. IMPRESSION: No fracture or dislocation of the left shoulder. The joint spaces are well preserved. There may be a small Hill-Sachs deformity of the left humeral head. No obvious bony Bankart fracture of the glenoid. Electronically Signed   By: Eddie Candle M.D.   On: 04/06/2019 19:57    Procedures Procedures (including critical care time)  Medications Ordered in ED Medications - No data to display   Initial Impression / Assessment and Plan / ED Course  I have reviewed the triage vital signs and the nursing notes.  Pertinent labs & imaging results that were available during my care of the patient were reviewed by me and considered in my medical  decision making (see chart for details).   Patient presents to the emergency department with complaints of left shoulder pain status post seizure 4 weeks ago which his neurologist has been made aware of.  Patient nontoxic-appearing, no apparent distress, vitals WNL.  No fever, erythema, or warmth to suggest septic joint.  No wounds.  No obvious deformity.  Patient has mild limitation in active range of motion and tenderness to the anterior glenohumeral joint.  X-ray reveals no fracture or dislocation of the left shoulder, there may be a small Hill-Sachs deformity of the left humeral head without obvious bony Bankart fracture of the glenoid.  Will place in sling/immobilizer, provide prescription for naproxen, and have him follow-up with orthopedics. I discussed results, treatment plan, need for follow-up, and return precautions with the patient. Provided opportunity for questions, patient confirmed understanding and is in agreement with plan.   Final Clinical Impressions(s) / ED Diagnoses   Final diagnoses:  Acute pain of left shoulder    ED Discharge Orders         Ordered    naproxen (NAPROSYN) 500 MG tablet  2 times daily     04/06/19 2253           Amaryllis Dyke, PA-C 04/06/19 2254    Virgel Manifold, MD 04/09/19 1359

## 2019-04-06 NOTE — ED Triage Notes (Signed)
Pt states hx of epilepsy.  States that 4 weeks ago, he had a seizure and fell onto his left shoulder.  States that ever since then, it has been bothering him and sometimes "popping out of place".

## 2019-04-11 ENCOUNTER — Ambulatory Visit (INDEPENDENT_AMBULATORY_CARE_PROVIDER_SITE_OTHER): Payer: Commercial Managed Care - PPO | Admitting: Orthopaedic Surgery

## 2019-04-11 ENCOUNTER — Encounter: Payer: Self-pay | Admitting: Orthopaedic Surgery

## 2019-04-11 VITALS — Ht 72.0 in | Wt 135.0 lb

## 2019-04-11 DIAGNOSIS — M25512 Pain in left shoulder: Secondary | ICD-10-CM

## 2019-04-11 DIAGNOSIS — G8929 Other chronic pain: Secondary | ICD-10-CM | POA: Diagnosis not present

## 2019-04-11 NOTE — Progress Notes (Signed)
Office Visit Note   Patient: Terrence Riley           Date of Birth: 04/26/2001           MRN: [id number] Visit Date: 04/11/2019              Requested by: Ander Flippen, MD [address] [place],  [place] [address] PCP: Ander Daughtry, MD   Assessment & Plan: Visit Diagnoses:  1. Chronic left shoulder pain     Plan: Impression is chronic left shoulder pain questionable subluxation.  I do recommend physical therapy for strengthening.  Work restriction was given for 6 weeks.  I would like to recheck him at that time.  Patient encouraged and answered.  Follow-Up Instructions: Return in about 6 weeks (around 05/23/2019).   Orders:  No orders of the defined types were placed in this encounter.  No orders of the defined types were placed in this encounter.     Procedures: No procedures performed   Clinical Data: No additional findings.   Subjective: Chief Complaint  Patient presents with  . Left Shoulder - Injury    DOI 03/27/2019    Mr. Terrence Riley is a 18 year old right-hand-dominant gentleman who comes in for evaluation of chronic left shoulder pain that started acutely as a result of the fall back in July.  He had a seizure at that time which caused him to lose consciousness and he was subsequently taken to the ER and evaluated.  That work-up was negative.  X-rays of his left shoulder were unremarkable.  He states that since the injury he is felt his shoulder pop in and out several times.  He states that the shoulder has been able to go back in place on its own without requiring any maneuvers.  He denies any numbness and tingling.  He has been wearing a sling.  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   Review of Systems  Constitutional: Negative.   All other systems reviewed and are negative.    Objective: Vital Signs: Ht 6' (1.829 m)   Wt 135 lb (61.2 kg)   BMI 18.31 kg/m   Physical Exam Vitals signs and nursing note reviewed.   Constitutional:      Appearance: He is well-developed.  HENT:     Head: Normocephalic and atraumatic.  Eyes:     Pupils: Pupils are equal, round, and reactive to light.  Neck:     Musculoskeletal: Neck supple.  Pulmonary:     Effort: Pulmonary effort is normal.  Abdominal:     Palpations: Abdomen is soft.  Musculoskeletal: Normal range of motion.  Skin:    General: Skin is warm.  Neurological:     Mental Status: He is alert and oriented to person, place, and time.  Psychiatric:        Behavior: Behavior normal.        Thought Content: Thought content normal.        Judgment: Judgment normal.     Ortho Exam Left shoulder exam shows full active and passive range of motion without any guarding or obvious signs of pain.  Rotator cuff testing is normal to manual muscle testing.  He mainly localizes pain to the suprascapular and trapezius region.  Negative sulcus sign.  Negative apprehension. Specialty Comments:  No specialty comments available.  Imaging: No results found.   PMFS History: There are no active problems to display for this patient.  Past Medical History:  Diagnosis  Date  . Allergy    rhinitis  . Asthma    as a child  . Eosinophilic esophagitis    heartburn and reflux  . Family history of adverse reaction to anesthesia    mother respiratory failure  . Nasal fracture    deviated septum  . Premature baby    2 months early  . Seizures ([place])    well controlled on meds/ one 2 months ago when meds were late    History reviewed. No pertinent family history.  Past Surgical History:  Procedure Laterality Date  . ADENOIDECTOMY    . CLOSED REDUCTION NASAL FRACTURE N/A 08/31/2017   Procedure: CLOSED REDUCTION NASAL FRACTURE;  Surgeon: Clyde Canterbury, MD;  Location: [place];  Service: ENT;  Laterality: N/A;  . SEPTOPLASTY N/A 08/31/2017   Procedure: SEPTOPLASTY;  Surgeon: Clyde Canterbury, MD;  Location: [facility];  Service: ENT;  Laterality: N/A;   . TONSILLECTOMY     and addenoids   Social History   Occupational History  . Not on file  Tobacco Use  . Smoking status: Never Smoker  . Smokeless tobacco: Never Used  Substance and Sexual Activity  . Alcohol use: No  . Drug use: No  . Sexual activity: Not on file

## 2019-05-09 ENCOUNTER — Encounter: Payer: Self-pay | Admitting: Emergency Medicine

## 2019-05-09 ENCOUNTER — Emergency Department
Admission: EM | Admit: 2019-05-09 | Discharge: 2019-05-09 | Disposition: A | Discharge status: Home / Self Care | Payer: Commercial Managed Care - PPO | Attending: Emergency Medicine | Admitting: Emergency Medicine

## 2019-05-09 ENCOUNTER — Other Ambulatory Visit: Payer: Self-pay

## 2019-05-09 DIAGNOSIS — J45909 Unspecified asthma, uncomplicated: Secondary | ICD-10-CM | POA: Diagnosis not present

## 2019-05-09 DIAGNOSIS — R569 Unspecified convulsions: Secondary | ICD-10-CM | POA: Insufficient documentation

## 2019-05-09 DIAGNOSIS — Z91010 Allergy to peanuts: Secondary | ICD-10-CM | POA: Insufficient documentation

## 2019-05-09 DIAGNOSIS — Z79899 Other long term (current) drug therapy: Secondary | ICD-10-CM | POA: Insufficient documentation

## 2019-05-09 LAB — BASIC METABOLIC PANEL
Anion gap: 10 (ref 5–15)
BUN: 13 mg/dL (ref 6–20)
CO2: 23 mmol/L (ref 22–32)
Calcium: 9.5 mg/dL (ref 8.9–10.3)
Chloride: 104 mmol/L (ref 98–111)
Creatinine, Ser: 1.05 mg/dL (ref 0.61–1.24)
GFR calc Af Amer: >60 mL/min (ref >60)
GFR calc non Af Amer: >60 mL/min (ref >60)
Glucose, Bld: 109 mg/dL — ABNORMAL HIGH (ref 70–99)
Potassium: 4 mmol/L (ref 3.5–5.1)
Sodium: 137 mmol/L (ref 135–145)

## 2019-05-09 LAB — URINALYSIS, COMPLETE (UACMP) WITH MICROSCOPIC
Bacteria, UA: NONE SEEN
Bilirubin Urine: NEGATIVE
Glucose, UA: NEGATIVE mg/dL
Hgb urine dipstick: NEGATIVE
Ketones, ur: NEGATIVE mg/dL
Leukocytes,Ua: NEGATIVE
Nitrite: NEGATIVE
Protein, ur: NEGATIVE mg/dL
Specific Gravity, Urine: 1.019 (ref 1.005–1.030)
pH: 6 (ref 5.0–8.0)

## 2019-05-09 LAB — CBC
HCT: 43.7 % (ref 39.0–52.0)
Hemoglobin: 15.1 g/dL (ref 13.0–17.0)
MCH: 28.9 pg (ref 26.0–34.0)
MCHC: 34.6 g/dL (ref 30.0–36.0)
MCV: 83.7 fL (ref 80.0–100.0)
Platelets: 176 10*3/uL (ref 150–400)
RBC: 5.22 MIL/uL (ref 4.22–5.81)
RDW: 11.9 % (ref 11.5–15.5)
WBC: 4.6 10*3/uL (ref 4.0–10.5)
nRBC: 0 % (ref 0.0–0.2)

## 2019-05-09 LAB — URINE DRUG SCREEN, QUALITATIVE (ARMC ONLY)
Amphetamines, Ur Screen: NOT DETECTED
Barbiturates, Ur Screen: NOT DETECTED
Benzodiazepine, Ur Scrn: NOT DETECTED
Cannabinoid 50 Ng, Ur ~~LOC~~: NOT DETECTED
Cocaine Metabolite,Ur ~~LOC~~: NOT DETECTED
MDMA (Ecstasy)Ur Screen: NOT DETECTED
Methadone Scn, Ur: NOT DETECTED
Opiate, Ur Screen: NOT DETECTED
Phencyclidine (PCP) Ur S: NOT DETECTED
Tricyclic, Ur Screen: NOT DETECTED

## 2019-05-09 NOTE — ED Triage Notes (Signed)
Pt presents to ED via [facility] from friend's house c/o seizure. Friend reportedly told EMS that pt started seizing while asleep and fell out of bed onto floor. Hx of seizure, on keppra and topamax. Reports compliance with meds. C\o L-sided headache, bruising/swelling noted to L side of head. Pt denies incontinence, no tongue injury noted.   EMS report pt initially post-ictal on their arrival to house, confusion has improved but pt still states year is 2001.

## 2019-05-09 NOTE — ED Notes (Signed)
Pt alert and oriented X 4, stable for discharge. RR even and unlabored, color WNL. Discussed discharge instructions and follow up when appropriate. Instructed to follow up with ER for any life threatening symptoms or concerns that patient or family of patient may have  

## 2019-05-09 NOTE — ED Provider Notes (Signed)
[facility] Emergency Department Provider Note  Time seen: 9:41 AM  I have reviewed the triage vital signs and the nursing notes.   HISTORY  Chief Complaint Seizures   HPI Terrence Riley is a 18 y.o. male with a past medical history of epilepsy, asthma, presents to the emergency department after a seizure.  According to EMS report  patient was at a friend's house when he had a seizure.  Fell off the bed onto the floor during the seizure.  Patient states he has a history of seizures takes Keppra and Topamax.  Last seizure prior to today was approximately 1 month ago per patient.  Patient states he has not missed any doses of his medications.  EMS state patient initially postictal upon arrival however mental status has improved considerably.  Past Medical History:  Diagnosis Date  . Allergy    rhinitis  . Asthma    as a child  . Eosinophilic esophagitis    heartburn and reflux  . Family history of adverse reaction to anesthesia    mother respiratory failure  . Nasal fracture    deviated septum  . Premature baby    2 months early  . Seizures ([place])    well controlled on meds/ one 2 months ago when meds were late    There are no active problems to display for this patient.   Past Surgical History:  Procedure Laterality Date  . ADENOIDECTOMY    . CLOSED REDUCTION NASAL FRACTURE N/A 08/31/2017   Procedure: CLOSED REDUCTION NASAL FRACTURE;  Surgeon: Clyde Canterbury, MD;  Location: [place];  Service: ENT;  Laterality: N/A;  . SEPTOPLASTY N/A 08/31/2017   Procedure: SEPTOPLASTY;  Surgeon: Clyde Canterbury, MD;  Location: [place];  Service: ENT;  Laterality: N/A;  . TONSILLECTOMY     and addenoids    Prior to Admission medications   Medication Sig Start Date End Date Taking? Authorizing Provider  albuterol (PROVENTIL HFA;VENTOLIN HFA) 108 (90 Base) MCG/ACT inhaler Inhale 2 puffs into the lungs every 4 (four) hours as needed for wheezing.   11/08/17   [provider]  albuterol (PROVENTIL) (2.5 MG/3ML) 0.083% nebulizer solution Take 3 mLs (2.5 mg total) by nebulization every 4 (four) hours as needed for wheezing or shortness of breath. [id number]   Delora Fuel, MD  budesonide (PULMICORT) 0.25 MG/2ML nebulizer solution Take 0.25 mg by nebulization 2 (two) times daily as needed. Wheezing    [provider]  cetirizine (ZYRTEC) 10 MG tablet Take 10 mg by mouth daily.     [provider]  Cholecalciferol (VITAMIN D-1000 MAX ST) 25 MCG (1000 UT) tablet Take 1,000 Units by mouth daily.  06/13/18   [provider]  clonazePAM (KLONOPIN) 0.5 MG tablet Take 0.5 mg by mouth daily. bedtime    [provider]  diazepam (DIASAT) 20 MG GEL Place 20 mg rectally once as needed. seizures 09/14/16   [provider]  EPINEPHrine 0.3 mg/0.3 mL IJ SOAJ injection Inject 0.3 mg into the muscle once as needed for anaphylaxis. Allergic reaction    [provider]  ibuprofen (ADVIL) 600 MG tablet Take 1 tablet (600 mg total) by mouth every 6 (six) hours as needed. 01/17/19   Charlann Lange, PA-C  levETIRAcetam (KEPPRA) 1000 MG tablet Take 1,000 mg by mouth 2 (two) times daily. Am and pm    [provider]  midazolam (VERSED) 5 MG/ML injection Place 2 mLs into the nose every 5 (five)  minutes as needed (seizures). Draw up prescribed dose (ml) in syringe, remove blue vial access device, then attach syringe to nasal atomizer for intranasal administration.     [provider]  naproxen (NAPROSYN) 500 MG tablet Take 1 tablet (500 mg total) by mouth 2 (two) times daily. 04/06/19   Petrucelli, Samantha R, PA-C  predniSONE (DELTASONE) 50 MG tablet Take 1 tablet (50 mg total) by mouth daily. [id number]   Delora Fuel, MD  PRESCRIPTION MEDICATION Apply 1 application topically 3 (three) times daily as needed. Nausea - Phenergan Cream-    [provider]  topiramate (TOPAMAX) 200 MG tablet Take  200 mg by mouth 2 (two) times daily.     [provider]    Allergies  Allergen Reactions  . Lortab [Hydrocodone-Acetaminophen] Hives  . Other Anaphylaxis    Peanuts  . Zofran [Ondansetron Hcl] Nausea And Vomiting  . Citrus Other (See Comments)    Sores in mouth  . Dairy Aid [Lactase] Nausea And Vomiting  . Flu Virus Vaccine Other (See Comments)  . Soy Allergy Other (See Comments)    Seizures   . Tylenol [Acetaminophen]     Can cause a fever and seizure activity   . Eggs Or Egg-Derived Products Rash    Pt mother reports pt only avoids eggs alone but can tolerate as an ingredient. [facility] 07/27/13 Pt mother reports pt only avoids eggs alone but can tolerate as an ingredient. [facility] 07/27/13 Pt mother reports pt only avoids eggs alone but can tolerate as an ingredient. [facility] 07/27/13   . Hydrocodone-Acetaminophen Rash    History reviewed. No pertinent family history.  Social History Social History   Tobacco Use  . Smoking status: Never Smoker  . Smokeless tobacco: Never Used  Substance Use Topics  . Alcohol use: No  . Drug use: No    Review of Systems Constitutional: Negative for fever. Cardiovascular: Negative for chest pain. Respiratory: Negative for shortness of breath. Gastrointestinal: Negative for abdominal pain Musculoskeletal: Negative for musculoskeletal complaints Skin: Negative for skin complaints  Neurological: Left head/face pain All other ROS negative  ____________________________________________   PHYSICAL EXAM:  VITAL SIGNS: ED Triage Vitals  Enc Vitals Group     BP 05/09/19 0926 135/81     Pulse Rate 05/09/19 0926 85     Resp 05/09/19 0926 18     Temp 05/09/19 0926 98.8 F (37.1 C)     Temp Source 05/09/19 0926 Oral     SpO2 05/09/19 0926 100 %     Weight 05/09/19 0929 135 lb (61.2 kg)     Height 05/09/19 0929 6' (1.829 m)     Head Circumference --      Peak Flow --      Pain Score 05/09/19 0927 5     Pain Loc --      Pain Edu? --       Excl. in [place]? --    Constitutional: Alert and oriented. Well appearing and in no distress. Eyes: Normal exam ENT      Head: Mild abrasions over her left eyebrow/left face      Mouth/Throat: Mucous membranes are moist. Cardiovascular: Normal rate, regular rhythm. Respiratory: Normal respiratory effort without tachypnea nor retractions. Breath sounds are clear Gastrointestinal: Soft and nontender. No distention.   Musculoskeletal: Nontender with normal range of motion in all extremities.  Neurologic:  Normal speech and language. No gross focal neurologic deficits.  Moves all extremities well. Skin:  Skin is warm, dry and  intact.  Psychiatric: Mood and affect are normal. Speech and behavior are normal.   ____________________________________________    EKG  EKG viewed and interpreted by myself shows a normal sinus rhythm 82 bpm with a narrow QRS, normal axis, normal intervals, no concerning ST changes.  ____________________________________________    INITIAL IMPRESSION / ASSESSMENT AND PLAN / ED COURSE  Pertinent labs & imaging results that were available during my care of the patient were reviewed by me and considered in my medical decision making (see chart for details).   Patient presents to the emergency department after a likely seizure.  History of seizure disorder takes Keppra and Topamax denies missing any doses recently.  Patient is currently awake alert and oriented.  No distress.  We will check basic labs as well as a urine sample and continue to closely monitor.  Patient agreeable to plan of care.  Patient's labs are largely within normal limits.  We will discharge the patient home with PCP follow-up.  Terrence Riley was evaluated in Emergency Department on 05/09/2019 for the symptoms described in the history of present illness. He was evaluated in the context of the global COVID-19 pandemic, which necessitated consideration that the patient might be at risk for infection  with the SARS-CoV-2 virus that causes COVID-19. Institutional protocols and algorithms that pertain to the evaluation of patients at risk for COVID-19 are in a state of rapid change based on information released by regulatory bodies including the CDC and federal and state organizations. These policies and algorithms were followed during the patient's care in the ED.  ____________________________________________   FINAL CLINICAL IMPRESSION(S) / ED DIAGNOSES  Seizure   Harvest Dark, MD 05/09/19 1231

## 2019-05-14 ENCOUNTER — Encounter (HOSPITAL_COMMUNITY): Payer: Self-pay | Admitting: Emergency Medicine

## 2019-05-14 ENCOUNTER — Emergency Department (HOSPITAL_COMMUNITY)
Admission: EM | Admit: 2019-05-14 | Discharge: 2019-05-15 | Disposition: A | Discharge status: Home / Self Care | Payer: Commercial Managed Care - PPO | Attending: Emergency Medicine | Admitting: Emergency Medicine

## 2019-05-14 ENCOUNTER — Emergency Department (HOSPITAL_COMMUNITY): Payer: Commercial Managed Care - PPO

## 2019-05-14 ENCOUNTER — Other Ambulatory Visit: Payer: Self-pay

## 2019-05-14 DIAGNOSIS — M25512 Pain in left shoulder: Secondary | ICD-10-CM | POA: Diagnosis not present

## 2019-05-14 DIAGNOSIS — Z79899 Other long term (current) drug therapy: Secondary | ICD-10-CM | POA: Diagnosis not present

## 2019-05-14 DIAGNOSIS — J45909 Unspecified asthma, uncomplicated: Secondary | ICD-10-CM | POA: Diagnosis not present

## 2019-05-14 IMAGING — CR DG SHOULDER 2+V*L*
3 series · 3 of 3 positions shown · non-contrast
Comparison: [DATE]

CLINICAL DATA: Acute LEFT shoulder pain following seizure several
days ago. Initial encounter.

EXAM:
LEFT SHOULDER - 2+ VIEW

[w shoulder external left]
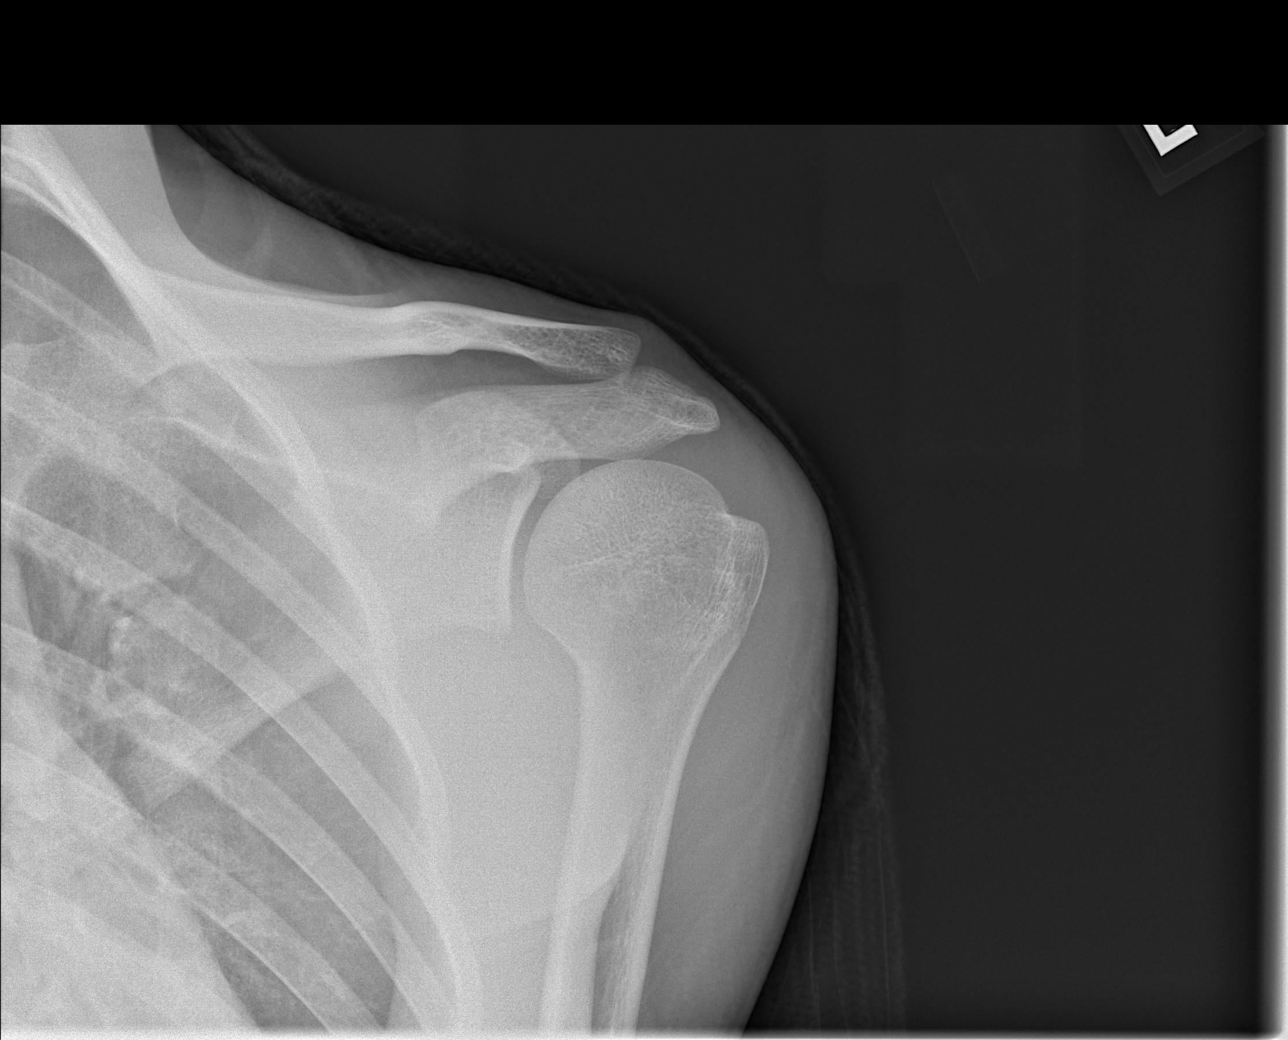

[w shoulder y-view left]
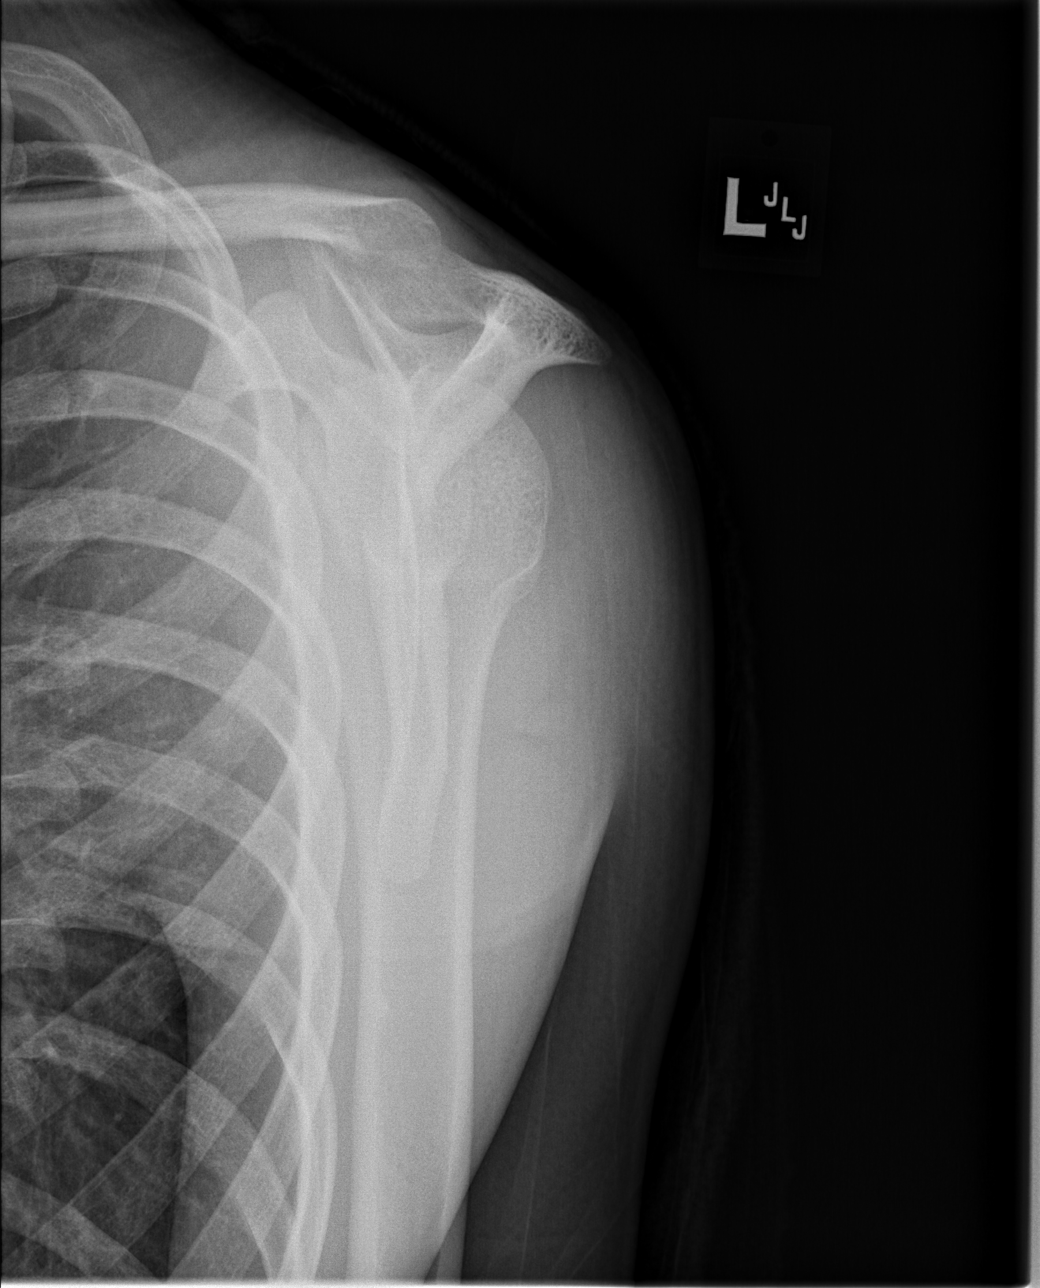

[x shoulder axillary left]
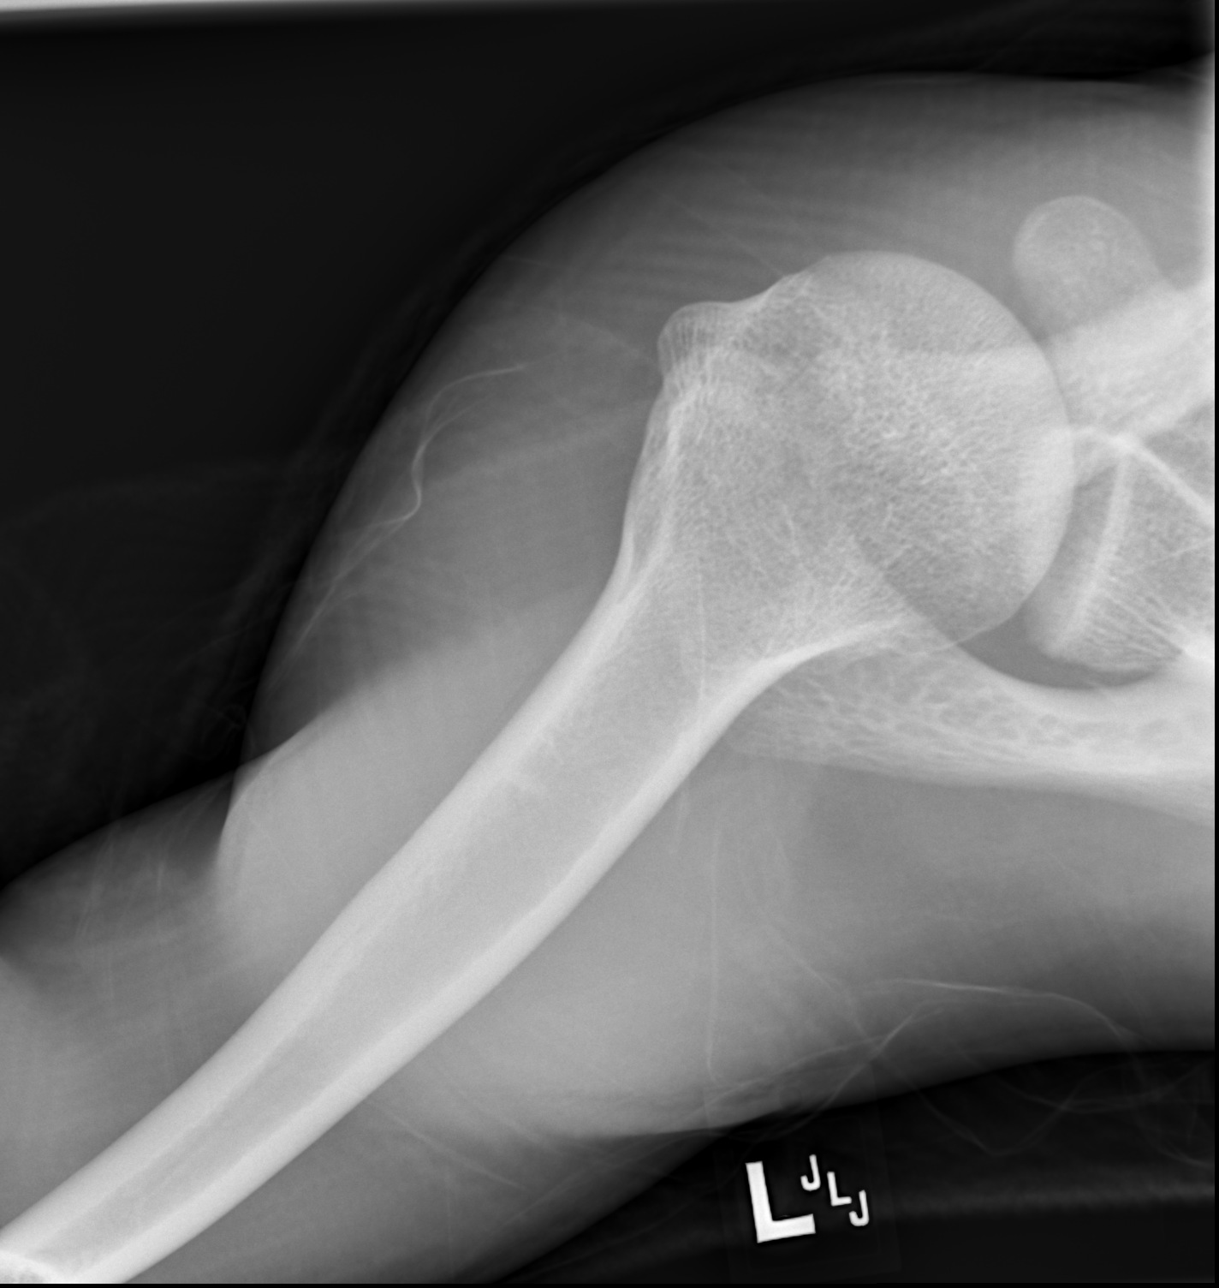

[3 of 3 positions shown; findings below may reference images not displayed]

FINDINGS: There is no evidence of fracture or dislocation. There is no
evidence of arthropathy or other focal bone abnormality. Soft
tissues are unremarkable.
IMPRESSION: Negative.

## 2019-05-14 NOTE — ED Provider Notes (Signed)
[facility] DEPT Provider Note   CSN: [id number] Arrival date & time: 05/14/19  2037     History   Chief Complaint Chief Complaint  Patient presents with   Shoulder Injury    HPI Terrence Riley is a 18 y.o. male.     The history is provided by the patient and medical records.  Shoulder Injury     18 year old male with history of seasonal allergies, asthma, seizure disorder, presenting to the ED with left shoulder pain.  States last week he had a seizure and was seen at outside hospital.  States he has been having ongoing left shoulder pain since that time.  During his seizure he apparently rolled off the bed and landed on the left shoulder.  He denies any numbness or weakness of his left arm.  He is unable to raise his arm above his head he states it feels like it is "pinching".  He is right-hand dominant.  He has tried taking Aleve at home without relief.  Past Medical History:  Diagnosis Date   Allergy    rhinitis   Asthma    as a child   Eosinophilic esophagitis    heartburn and reflux   Family history of adverse reaction to anesthesia    mother respiratory failure   Nasal fracture    deviated septum   Premature baby    2 months early   Seizures ([place])    well controlled on meds/ one 2 months ago when meds were late    There are no active problems to display for this patient.   Past Surgical History:  Procedure Laterality Date   ADENOIDECTOMY     CLOSED REDUCTION NASAL FRACTURE N/A 08/31/2017   Procedure: CLOSED REDUCTION NASAL FRACTURE;  Surgeon: Clyde Canterbury, MD;  Location: [place];  Service: ENT;  Laterality: N/A;   SEPTOPLASTY N/A 08/31/2017   Procedure: SEPTOPLASTY;  Surgeon: Clyde Canterbury, MD;  Location: [place];  Service: ENT;  Laterality: N/A;   TONSILLECTOMY     and addenoids        Home Medications    Prior to Admission medications   Medication Sig Start Date End Date Taking?  Authorizing Provider  albuterol (PROVENTIL HFA;VENTOLIN HFA) 108 (90 Base) MCG/ACT inhaler Inhale 2 puffs into the lungs every 4 (four) hours as needed for wheezing.  11/08/17   [provider]  albuterol (PROVENTIL) (2.5 MG/3ML) 0.083% nebulizer solution Take 3 mLs (2.5 mg total) by nebulization every 4 (four) hours as needed for wheezing or shortness of breath. [id number]   Delora Fuel, MD  budesonide (PULMICORT) 0.25 MG/2ML nebulizer solution Take 0.25 mg by nebulization 2 (two) times daily as needed. Wheezing    [provider]  cetirizine (ZYRTEC) 10 MG tablet Take 10 mg by mouth daily.     [provider]  Cholecalciferol (VITAMIN D-1000 MAX ST) 25 MCG (1000 UT) tablet Take 1,000 Units by mouth daily.  06/13/18   [provider]  clonazePAM (KLONOPIN) 0.5 MG tablet Take 0.5 mg by mouth daily. bedtime    [provider]  diazepam (DIASAT) 20 MG GEL Place 20 mg rectally once as needed. seizures 09/14/16   [provider]  EPINEPHrine 0.3 mg/0.3 mL IJ SOAJ injection Inject 0.3 mg into the muscle once as needed for anaphylaxis. Allergic reaction    [provider]  ibuprofen (ADVIL) 600 MG tablet Take 1 tablet (600 mg total) by mouth every 6 (six) hours as  needed. 01/17/19   Charlann Lange, PA-C  levETIRAcetam (KEPPRA) 1000 MG tablet Take 1,000 mg by mouth 2 (two) times daily. Am and pm    [provider]  midazolam (VERSED) 5 MG/ML injection Place 2 mLs into the nose every 5 (five) minutes as needed (seizures). Draw up prescribed dose (ml) in syringe, remove blue vial access device, then attach syringe to nasal atomizer for intranasal administration.     [provider]  naproxen (NAPROSYN) 500 MG tablet Take 1 tablet (500 mg total) by mouth 2 (two) times daily. 04/06/19   Petrucelli, Samantha R, PA-C  PRESCRIPTION MEDICATION Apply 1 application topically 3 (three) times daily as needed. Nausea - Phenergan Cream-    [provider]  topiramate (TOPAMAX) 200 MG tablet Take 200 mg by mouth 2 (two) times daily.     [provider]    Family History History reviewed. No pertinent family history.  Social History Social History   Tobacco Use   Smoking status: Never Smoker   Smokeless tobacco: Never Used  Substance Use Topics   Alcohol use: No   Drug use: No     Allergies   Lortab [hydrocodone-acetaminophen], Other, Zofran [ondansetron hcl], Citrus, Dairy aid [lactase], Flu virus vaccine, Soy allergy, Tylenol [acetaminophen], Eggs or egg-derived products, and Hydrocodone-acetaminophen   Review of Systems Review of Systems  Musculoskeletal: Positive for arthralgias.  All other systems reviewed and are negative.    Physical Exam Updated Vital Signs BP 127/88 (BP Location: Right Arm)    Pulse (!) 56    Temp 98 F (36.7 C) (Oral)    Resp 16    SpO2 99%   Physical Exam Vitals signs and nursing note reviewed.  Constitutional:      Appearance: He is well-developed.  HENT:     Head: Normocephalic and atraumatic.  Eyes:     Conjunctiva/sclera: Conjunctivae normal.     Pupils: Pupils are equal, round, and reactive to light.  Neck:     Musculoskeletal: Normal range of motion.  Cardiovascular:     Rate and Rhythm: Normal rate and regular rhythm.     Heart sounds: Normal heart sounds.  Pulmonary:     Effort: Pulmonary effort is normal.     Breath sounds: Normal breath sounds.  Abdominal:     General: Bowel sounds are normal.     Palpations: Abdomen is soft.  Musculoskeletal: Normal range of motion.     Comments: Left shoulder normal in appearance without swelling or deformity noted, poor effort with ROM testing, patient unwilling to lift arm above head but full ROM of elbow, wrist, all fingers, etc  Skin:    General: Skin is warm and dry.  Neurological:     Mental Status: He is alert and oriented to person, place, and time.      ED Treatments / Results  Labs (all labs  ordered are listed, but only abnormal results are displayed) Labs Reviewed - No data to display  EKG None  Radiology Dg Shoulder Left  Result Date: 05/14/2019 CLINICAL DATA:  Acute LEFT shoulder pain following seizure several days ago. Initial encounter. EXAM: LEFT SHOULDER - 2+ VIEW COMPARISON:  04/06/2019 FINDINGS: There is no evidence of fracture or dislocation. There is no evidence of arthropathy or other focal bone abnormality. Soft tissues are unremarkable. IMPRESSION: Negative. Electronically Signed   By: Margarette Canada M.D.   On: 05/14/2019 21:59    Procedures Procedures (including critical care time)  Medications Ordered in  ED Medications - No data to display   Initial Impression / Assessment and Plan / ED Course  I have reviewed the triage vital signs and the nursing notes.  Pertinent labs & imaging results that were available during my care of the patient were reviewed by me and considered in my medical decision making (see chart for details).  18 year old male here with several days of left shoulder pain following seizure.  He does not have any acute deformities on exam.  He is somewhat uncooperative with range of motion testing, unwilling to raise his arm.  He does have full range of motion of the elbow, wrist, and all fingers.  His left arm is neurovascularly intact with normal strength.  X-ray is negative.  He was given shoulder sling and will discharge home on anti-inflammatories.  He will need to follow-up with his primary care doctor.  He may return here for any new or acute changes.  Final Clinical Impressions(s) / ED Diagnoses   Final diagnoses:  Acute pain of left shoulder    ED Discharge Orders         Ordered    naproxen (NAPROSYN) 500 MG tablet  2 times daily with meals     05/15/19 0011           Larene Pickett, PA-C 05/15/19 0024    Merrily Pew, MD 05/15/19 0126

## 2019-05-14 NOTE — ED Triage Notes (Signed)
Pt reports having a seizure several days ago and fell landing on face and now having pain in left shoulder. Pt was seen at [facility] for seizure on 05/09/2019.

## 2019-05-15 DIAGNOSIS — M25512 Pain in left shoulder: Secondary | ICD-10-CM | POA: Diagnosis not present

## 2019-05-15 MED ORDER — NAPROXEN 500 MG PO TABS: 500.0000 mg | ORAL_TABLET | Freq: Two times a day (BID) | ORAL | 0 refills | Status: DC

## 2019-05-15 NOTE — Discharge Instructions (Signed)
Shoulder films were normal.  No dislocation or fracture. Take the prescribed medication as directed.  Wear sling for now until more comfortable.  Can use ice if you like as well. Follow-up with your primary care doctor. Return to the ED for new or worsening symptoms.

## 2019-06-29 DIAGNOSIS — G43009 Migraine without aura, not intractable, without status migrainosus: Secondary | ICD-10-CM | POA: Insufficient documentation

## 2019-09-11 ENCOUNTER — Emergency Department (HOSPITAL_COMMUNITY)
Admission: EM | Admit: 2019-09-11 | Discharge: 2019-09-12 | Disposition: A | Discharge status: Home / Self Care | Payer: Commercial Managed Care - PPO | Attending: Emergency Medicine | Admitting: Emergency Medicine

## 2019-09-11 ENCOUNTER — Encounter (HOSPITAL_COMMUNITY): Payer: Self-pay | Admitting: Emergency Medicine

## 2019-09-11 ENCOUNTER — Other Ambulatory Visit: Payer: Self-pay

## 2019-09-11 ENCOUNTER — Emergency Department (HOSPITAL_COMMUNITY): Payer: Commercial Managed Care - PPO

## 2019-09-11 DIAGNOSIS — R0602 Shortness of breath: Secondary | ICD-10-CM | POA: Insufficient documentation

## 2019-09-11 DIAGNOSIS — J45909 Unspecified asthma, uncomplicated: Secondary | ICD-10-CM | POA: Diagnosis not present

## 2019-09-11 DIAGNOSIS — R079 Chest pain, unspecified: Secondary | ICD-10-CM | POA: Diagnosis present

## 2019-09-11 DIAGNOSIS — Z79899 Other long term (current) drug therapy: Secondary | ICD-10-CM | POA: Diagnosis not present

## 2019-09-11 LAB — I-STAT CHEM 8, ED
BUN: 4 mg/dL — ABNORMAL LOW (ref 6–20)
Calcium, Ion: 1.26 mmol/L (ref 1.15–1.40)
Chloride: 107 mmol/L (ref 98–111)
Creatinine, Ser: 1.2 mg/dL (ref 0.61–1.24)
Glucose, Bld: 64 mg/dL — ABNORMAL LOW (ref 70–99)
HCT: 45 % (ref 39.0–52.0)
Hemoglobin: 15.3 g/dL (ref 13.0–17.0)
Potassium: 3.9 mmol/L (ref 3.5–5.1)
Sodium: 142 mmol/L (ref 135–145)
TCO2: 22 mmol/L (ref 22–32)

## 2019-09-11 IMAGING — CR DG CHEST 2V
2 series · 2 of 2 positions shown · non-contrast
Comparison: [DATE].

CLINICAL DATA: Pleuritic chest pain.

EXAM:
CHEST - 2 VIEW

[w chest pa]
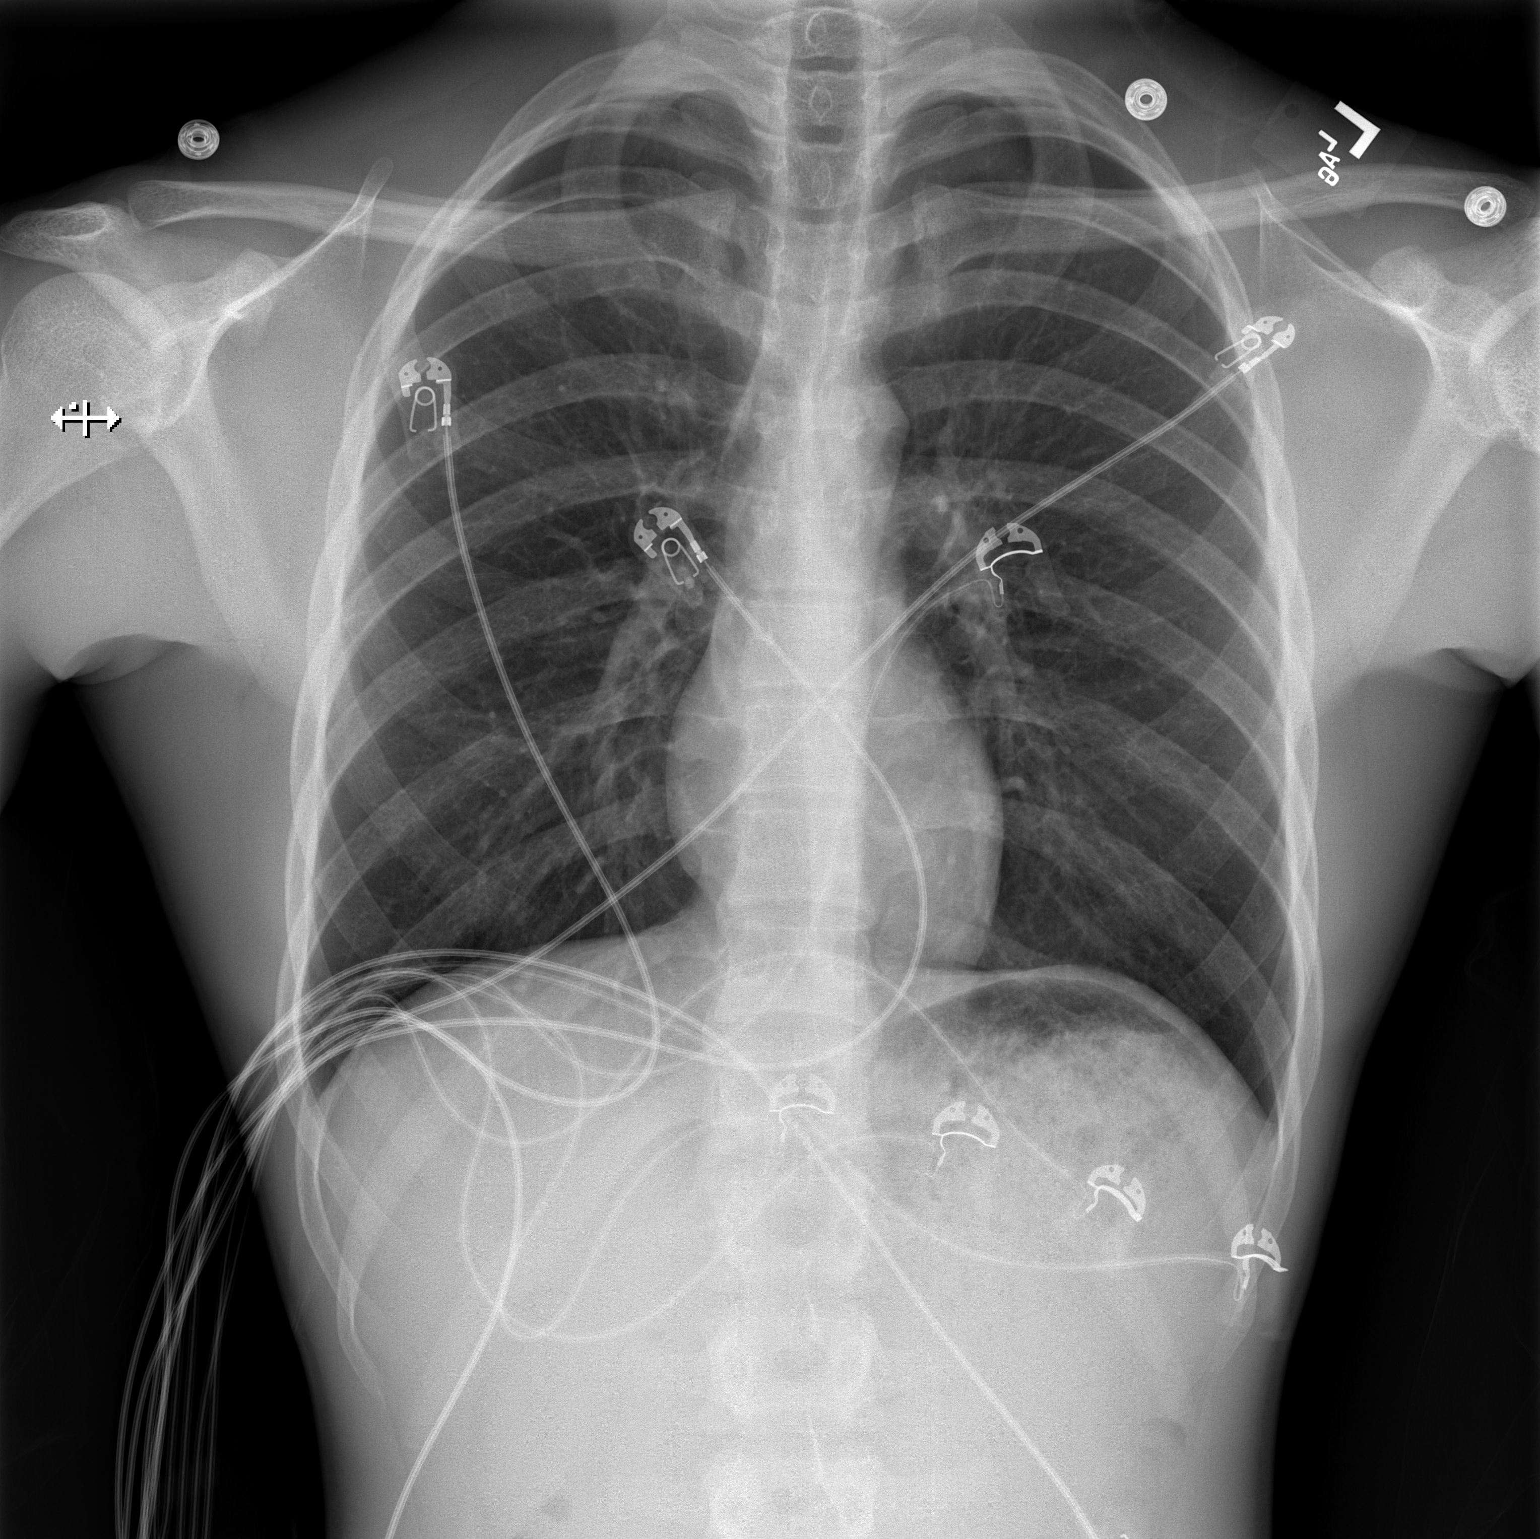

[w chest lat]
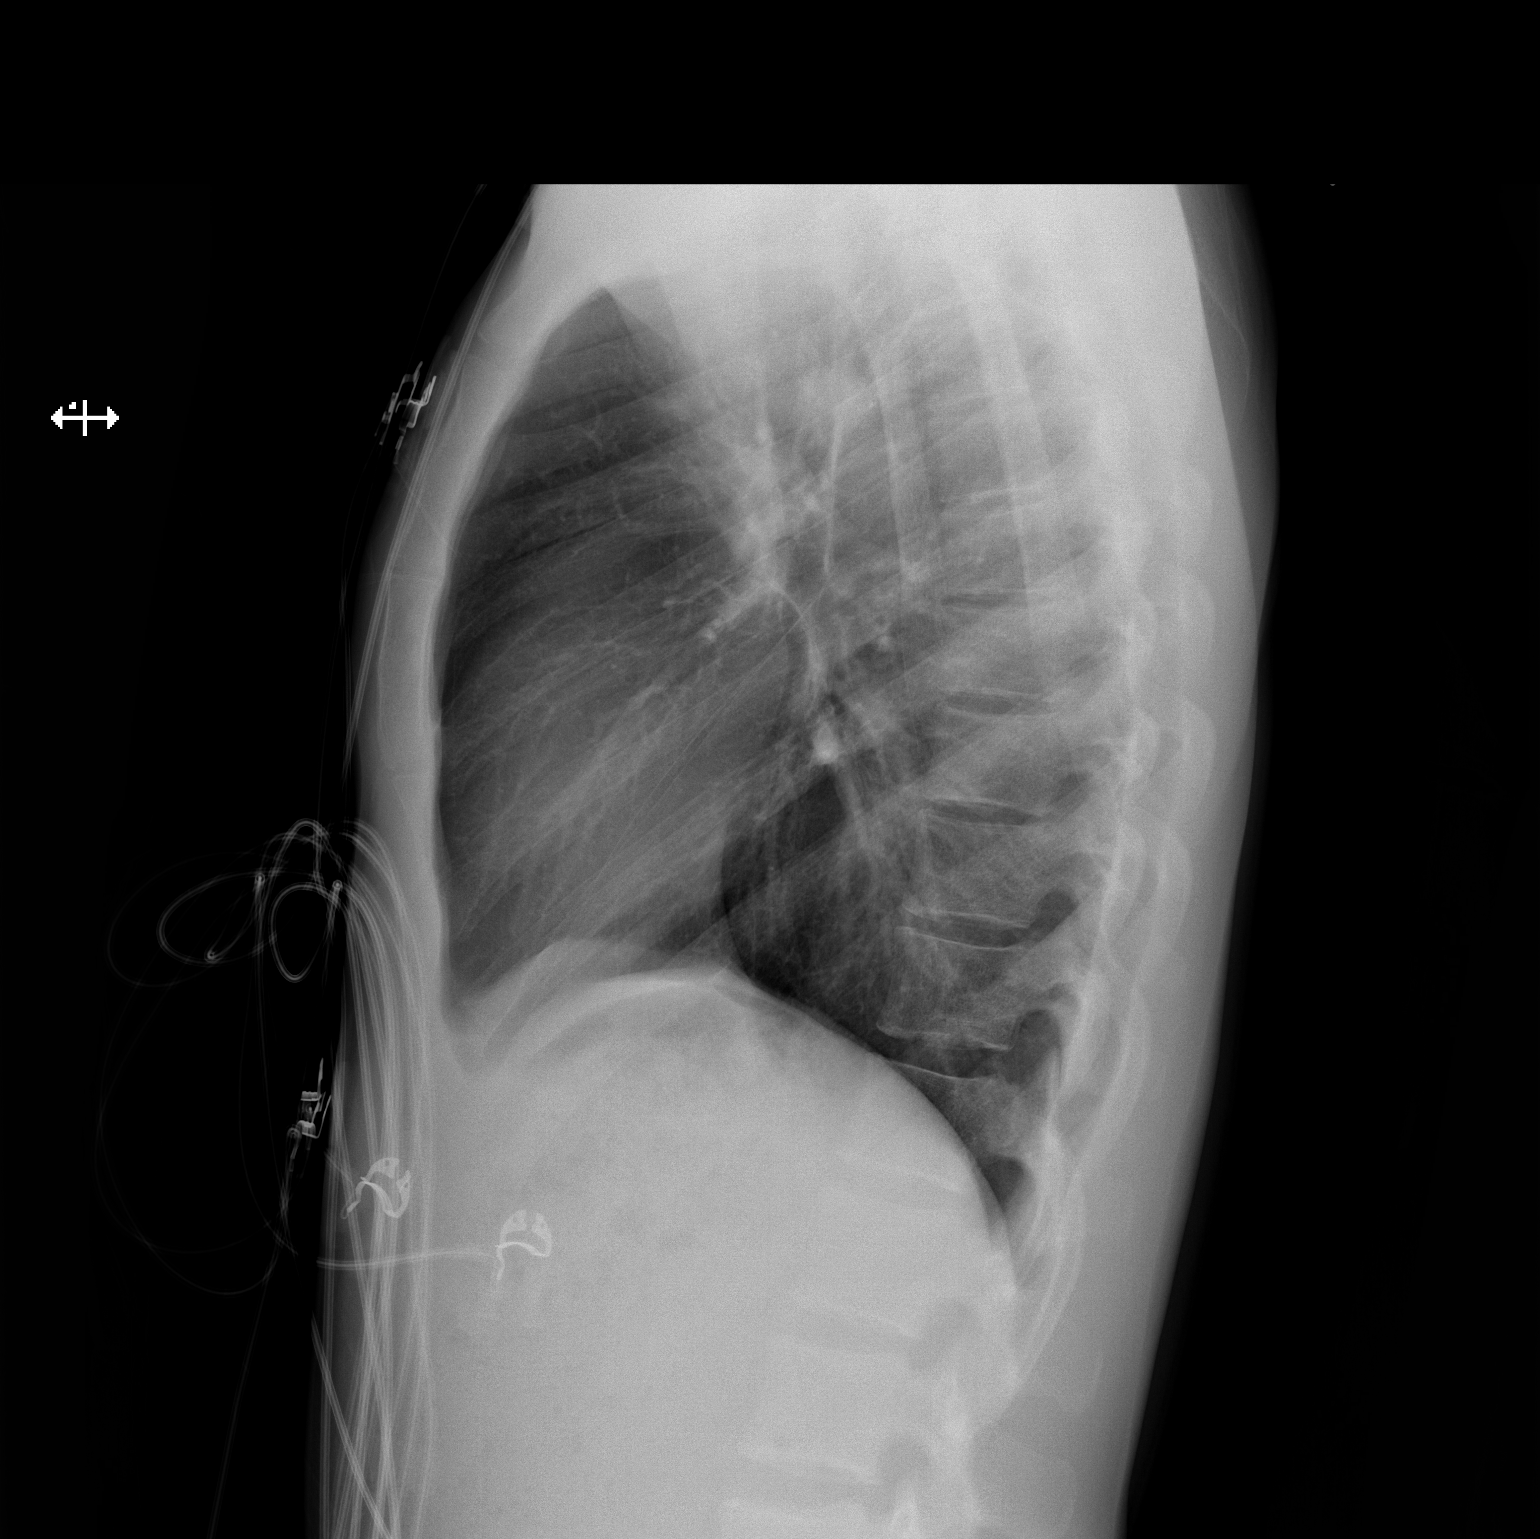

[2 of 2 positions shown; findings below may reference images not displayed]

FINDINGS: The heart size and mediastinal contours are within normal limits.
Both lungs are clear. The visualized skeletal structures are
unremarkable.
IMPRESSION: No active cardiopulmonary disease.

## 2019-09-11 NOTE — ED Triage Notes (Signed)
Patient complains of central chest pain when taking a deep breath. He also reports chest pain radiated to the left while walking to his room.

## 2019-09-11 NOTE — ED Provider Notes (Signed)
[facility] DEPT Provider Note   CSN: [id number] Arrival date & time: 09/11/19  2229     History Chief Complaint  Patient presents with  . Chest Pain  . Shortness of Breath    Terrence Riley is a 19 y.o. male.   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  Patient reports a pressure-like chest pain in his central chest which has been constant x2 days.  It will occasionally feel as though it is radiating towards his diaphragm when he is taking a deep breath.  Breathing aggravates his pain, but he has not had associated shortness of breath.  Also notes some symptomatic worsening when leaning back or lying flat. Has mild improvement when sitting upright. Took ibuprofen and Tums for symptoms without relief. Denies trauma/injury, fevers, jaw or back pain, N/V/D, hemoptysis, cough, leg swelling, recent surgeries or hospitalizations, illicit drug use. No FHx of sudden cardiac death at a young age.   Chest Pain Associated symptoms: shortness of breath   Shortness of Breath Associated symptoms: chest pain        Past Medical History:  Diagnosis Date  . Allergy    rhinitis  . Asthma    as a child  . Eosinophilic esophagitis    heartburn and reflux  . Family history of adverse reaction to anesthesia    mother respiratory failure  . Nasal fracture    deviated septum  . Premature baby    2 months early  . Seizures ([place])    well controlled on meds/ one 2 months ago when meds were late    There are no problems to display for this patient.   Past Surgical History:  Procedure Laterality Date  . ADENOIDECTOMY    . CLOSED REDUCTION NASAL FRACTURE N/A 08/31/2017   Procedure: CLOSED REDUCTION NASAL FRACTURE;  Surgeon: Clyde Canterbury, MD;  Location: [facility];  Service: ENT;  Laterality: N/A;  . SEPTOPLASTY N/A 08/31/2017   Procedure: SEPTOPLASTY;   Surgeon: Clyde Canterbury, MD;  Location: [facility] [place];  Service: ENT;  Laterality: N/A;  . TONSILLECTOMY     and addenoids       History reviewed. No pertinent family history.  Social History   Tobacco Use  . Smoking status: Never Smoker  . Smokeless tobacco: Never Used  Substance Use Topics  . Alcohol use: No  . Drug use: No    Home Medications Prior to Admission medications   Medication Sig Start Date End Date Taking? Authorizing Provider  albuterol (PROVENTIL HFA;VENTOLIN HFA) 108 (90 Base) MCG/ACT inhaler Inhale 2 puffs into the lungs every 4 (four) hours as needed for wheezing.  11/08/17  Yes [provider]  albuterol (PROVENTIL) (2.5 MG/3ML) 0.083% nebulizer solution Take 3 mLs (2.5 mg total) by nebulization every 4 (four) hours as needed for wheezing or shortness of breath. [id number]  Yes Delora Fuel, MD  budesonide (PULMICORT) 0.25 MG/2ML nebulizer solution Take 0.25 mg by nebulization 2 (two) times daily as needed. Wheezing   Yes [provider]  cetirizine (ZYRTEC) 10 MG tablet Take 10 mg by mouth daily.    Yes [provider]  Cholecalciferol (VITAMIN D-1000 MAX ST) 25 MCG (1000 UT) tablet Take 1,000 Units by mouth daily.  06/13/18  Yes [provider]  clonazePAM (KLONOPIN) 0.5 MG tablet Take 0.5 mg by mouth daily. bedtime   Yes [provider]  diazepam (DIASAT) 20 MG GEL Place 20 mg rectally once as needed. seizures 09/14/16  Yes [provider]  EPINEPHrine 0.3 mg/0.3 mL IJ SOAJ injection Inject 0.3 mg into the muscle once as needed for anaphylaxis. Allergic reaction   Yes [provider]  levETIRAcetam (KEPPRA) 1000 MG tablet Take 1,000 mg by mouth 2 (two) times daily. Am and pm   Yes [provider]  midazolam (VERSED) 5 MG/ML injection Place 2 mLs into the nose every 5 (five) minutes as needed (seizures). Draw up prescribed dose (ml) in syringe, remove blue vial access device, then attach  syringe to nasal atomizer for intranasal administration.    Yes [provider]  PRESCRIPTION MEDICATION Apply 1 application topically 3 (three) times daily as needed. Nausea - Phenergan Cream-   Yes [provider]  topiramate (TOPAMAX) 200 MG tablet Take 200 mg by mouth 2 (two) times daily.    Yes [provider]  ibuprofen (ADVIL) 800 MG tablet Take 1 tablet (800 mg total) by mouth 3 (three) times daily for 14 days. 09/12/19 09/26/19  Antonietta Breach, PA-C    Allergies    Lortab [hydrocodone-acetaminophen], Other, Zofran [name] hcl], Citrus, Dairy aid [lactase], Flu virus vaccine, Soy allergy, Tylenol [acetaminophen], Eggs or egg-derived products, and Hydrocodone-acetaminophen  Review of Systems   Review of Systems  Respiratory: Positive for shortness of breath.   Cardiovascular: Positive for chest pain.  Ten systems reviewed and are negative for acute change, except as noted in the HPI.    Physical Exam Updated Vital Signs BP 134/88 (BP Location: Right Arm)   Pulse 65   Temp 97.6 F (36.4 C) (Oral)   Resp 15   Ht 6' (1.829 m)   Wt 61.2 kg   SpO2 100%   BMI 18.31 kg/m   Physical Exam Vitals and nursing note reviewed.  Constitutional:      General: He is not in acute distress.    Appearance: He is well-developed. He is not diaphoretic.     Comments: Nontoxic appearing and in NAD  HENT:     Head: Normocephalic and atraumatic.  Eyes:     General: No scleral icterus.    Conjunctiva/sclera: Conjunctivae normal.  Cardiovascular:     Rate and Rhythm: Normal rate and regular rhythm.     Pulses: Normal pulses.  Pulmonary:     Effort: Pulmonary effort is normal. No respiratory distress.     Breath sounds: No stridor. No wheezing, rhonchi or rales.     Comments: Lungs CTAB. Respirations even and unlabored. Musculoskeletal:        General: Normal range of motion.     Cervical back: Normal range of motion.  Skin:    General: Skin is warm and dry.      Coloration: Skin is not pale.     Findings: No erythema or rash.  Neurological:     General: No focal deficit present.     Mental Status: He is alert and oriented to person, place, and time.     Coordination: Coordination normal.     Comments: GCS 15. Patient moving all extremities spontaneously.  Psychiatric:        Behavior: Behavior normal.     ED Results / Procedures / Treatments   Labs (all labs ordered are listed, but only abnormal results are displayed) Labs Reviewed  I-STAT CHEM 8, ED - Abnormal; Notable for the following components:      Result Value   BUN 4 (*)  Glucose, Bld 64 (*)    All other components within normal limits  TROPONIN I (HIGH SENSITIVITY)    EKG EKG Interpretation  Date/Time:  Monday September 11 2019 22:57:01 EST Ventricular Rate:  66 PR Interval:    QRS Duration: 81 QT Interval:  392 QTC Calculation: 411 R Axis:   58 Text Interpretation: Sinus rhythm Probable left atrial enlargement ST elev, probable normal early repol pattern No significant change since last tracing Confirmed by Deno Etienne [id number]) on 09/11/2019 11:05:48 PM   Radiology DG Chest 2 View  Result Date: 09/11/2019 CLINICAL DATA:  Pleuritic chest pain. EXAM: CHEST - 2 VIEW COMPARISON:  July 13, 2018. FINDINGS: The heart size and mediastinal contours are within normal limits. Both lungs are clear. The visualized skeletal structures are unremarkable. IMPRESSION: No active cardiopulmonary disease. Electronically Signed   By: Constance Holster M.D.   On: 09/11/2019 23:07    Procedures Procedures (including critical care time)  Medications Ordered in ED Medications - No data to display  ED Course  I have reviewed the triage vital signs and the nursing notes.  Pertinent labs & imaging results that were available during my care of the patient were reviewed by me and considered in my medical decision making (see chart for details).    MDM Rules/Calculators/A&P                       Patient presents to the emergency department for evaluation of chest pain.  EKG is nonischemic and troponin negative.  Chest x-ray without evidence of pneumothorax, pneumonia, pleural effusion.  Pulmonary embolus further considered; however, patient without tachycardia, tachypnea, dyspnea, hypoxia.  Patient is PERC negative.  Question whether patient may be experiencing mild pericarditis given that his pain is aggravated when leaning back or lying supine.  Doubt emergent cause of symptoms today.  VSS.  Plan for discharge on course of high-dose NSAIDs with instruction for primary care follow-up.  Return precautions discussed and provided. Patient discharged in stable condition with no unaddressed concerns.  Vitals:   09/11/19 2342 09/12/19 0000 09/12/19 0030 09/12/19 0057  BP: 123/84 (!) 116/98 134/88 134/88  Pulse: 62 74 62 65  Resp: 15 20 17 15   Temp:      TempSrc:      SpO2: 100% 99% 100% 100%  Weight:      Height:        Final Clinical Impression(s) / ED Diagnoses Final diagnoses:  Chest pain, unspecified type    Rx / DC Orders ED Discharge Orders         Ordered    ibuprofen (ADVIL) 800 MG tablet  3 times daily     09/12/19 0051           Antonietta Breach, PA-C 09/12/19 [place], [place], DO 09/12/19 1503

## 2019-09-11 NOTE — ED Notes (Signed)
Patient transported to X-ray 

## 2019-09-12 LAB — TROPONIN I (HIGH SENSITIVITY): Troponin I (High Sensitivity): <2 ng/L (ref <18)

## 2019-09-12 MED ORDER — IBUPROFEN 800 MG PO TABS: 800.0000 mg | ORAL_TABLET | Freq: Three times a day (TID) | ORAL | 0 refills | Status: AC

## 2019-09-12 NOTE — ED Notes (Signed)
Gave pt cranberry juice and [place] sandwich.

## 2019-09-12 NOTE — Discharge Instructions (Signed)
Your work-up in the emergency department this evening has been reassuring.  It is possible that your symptoms may be due to very mild pericarditis which is treated with consistent use of ibuprofen.  Take the ibuprofen prescribed to you and follow-up with a primary care doctor to ensure that symptoms resolve.  You may return to the ED for any new or concerning symptoms.

## 2019-10-02 ENCOUNTER — Encounter (HOSPITAL_COMMUNITY): Payer: Self-pay | Admitting: Obstetrics and Gynecology

## 2019-10-02 ENCOUNTER — Other Ambulatory Visit: Payer: Self-pay

## 2019-10-02 ENCOUNTER — Emergency Department (HOSPITAL_COMMUNITY)
Admission: EM | Admit: 2019-10-02 | Discharge: 2019-10-02 | Disposition: A | Discharge status: Home / Self Care | Payer: Commercial Managed Care - PPO | Attending: Emergency Medicine | Admitting: Emergency Medicine

## 2019-10-02 DIAGNOSIS — Y939 Activity, unspecified: Secondary | ICD-10-CM | POA: Diagnosis not present

## 2019-10-02 DIAGNOSIS — J45909 Unspecified asthma, uncomplicated: Secondary | ICD-10-CM | POA: Insufficient documentation

## 2019-10-02 DIAGNOSIS — Z79899 Other long term (current) drug therapy: Secondary | ICD-10-CM | POA: Insufficient documentation

## 2019-10-02 DIAGNOSIS — Y929 Unspecified place or not applicable: Secondary | ICD-10-CM | POA: Insufficient documentation

## 2019-10-02 DIAGNOSIS — Y999 Unspecified external cause status: Secondary | ICD-10-CM | POA: Insufficient documentation

## 2019-10-02 DIAGNOSIS — G4459 Other complicated headache syndrome: Secondary | ICD-10-CM

## 2019-10-02 DIAGNOSIS — S0990XA Unspecified injury of head, initial encounter: Secondary | ICD-10-CM

## 2019-10-02 NOTE — Discharge Instructions (Addendum)
Return if any problems.
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
## 2019-10-20 ENCOUNTER — Emergency Department (HOSPITAL_COMMUNITY)
Admission: EM | Admit: 2019-10-20 | Discharge: 2019-10-20 | Disposition: A | Discharge status: Home / Self Care | Payer: Commercial Managed Care - PPO | Attending: Emergency Medicine | Admitting: Emergency Medicine

## 2019-10-20 ENCOUNTER — Other Ambulatory Visit: Payer: Self-pay

## 2019-10-20 ENCOUNTER — Encounter (HOSPITAL_COMMUNITY): Payer: Self-pay | Admitting: Emergency Medicine

## 2019-10-20 ENCOUNTER — Emergency Department (HOSPITAL_COMMUNITY): Payer: Commercial Managed Care - PPO

## 2019-10-20 DIAGNOSIS — Y929 Unspecified place or not applicable: Secondary | ICD-10-CM | POA: Insufficient documentation

## 2019-10-20 DIAGNOSIS — Y9367 Activity, basketball: Secondary | ICD-10-CM | POA: Diagnosis not present

## 2019-10-20 DIAGNOSIS — J45909 Unspecified asthma, uncomplicated: Secondary | ICD-10-CM | POA: Diagnosis not present

## 2019-10-20 DIAGNOSIS — Z79899 Other long term (current) drug therapy: Secondary | ICD-10-CM | POA: Diagnosis not present

## 2019-10-20 DIAGNOSIS — Y999 Unspecified external cause status: Secondary | ICD-10-CM | POA: Insufficient documentation

## 2019-10-20 DIAGNOSIS — X58XXXA Exposure to other specified factors, initial encounter: Secondary | ICD-10-CM | POA: Diagnosis not present

## 2019-10-20 DIAGNOSIS — S93402A Sprain of unspecified ligament of left ankle, initial encounter: Secondary | ICD-10-CM | POA: Diagnosis not present

## 2019-10-20 DIAGNOSIS — S99912A Unspecified injury of left ankle, initial encounter: Secondary | ICD-10-CM | POA: Diagnosis present

## 2019-10-20 IMAGING — CR DG ANKLE COMPLETE 3+V*L*
3 series · 3 of 3 positions shown · non-contrast
Comparison: None.

CLINICAL DATA: Ankle injury.

EXAM:
LEFT ANKLE COMPLETE - 3+ VIEW

[x ankle ap left]
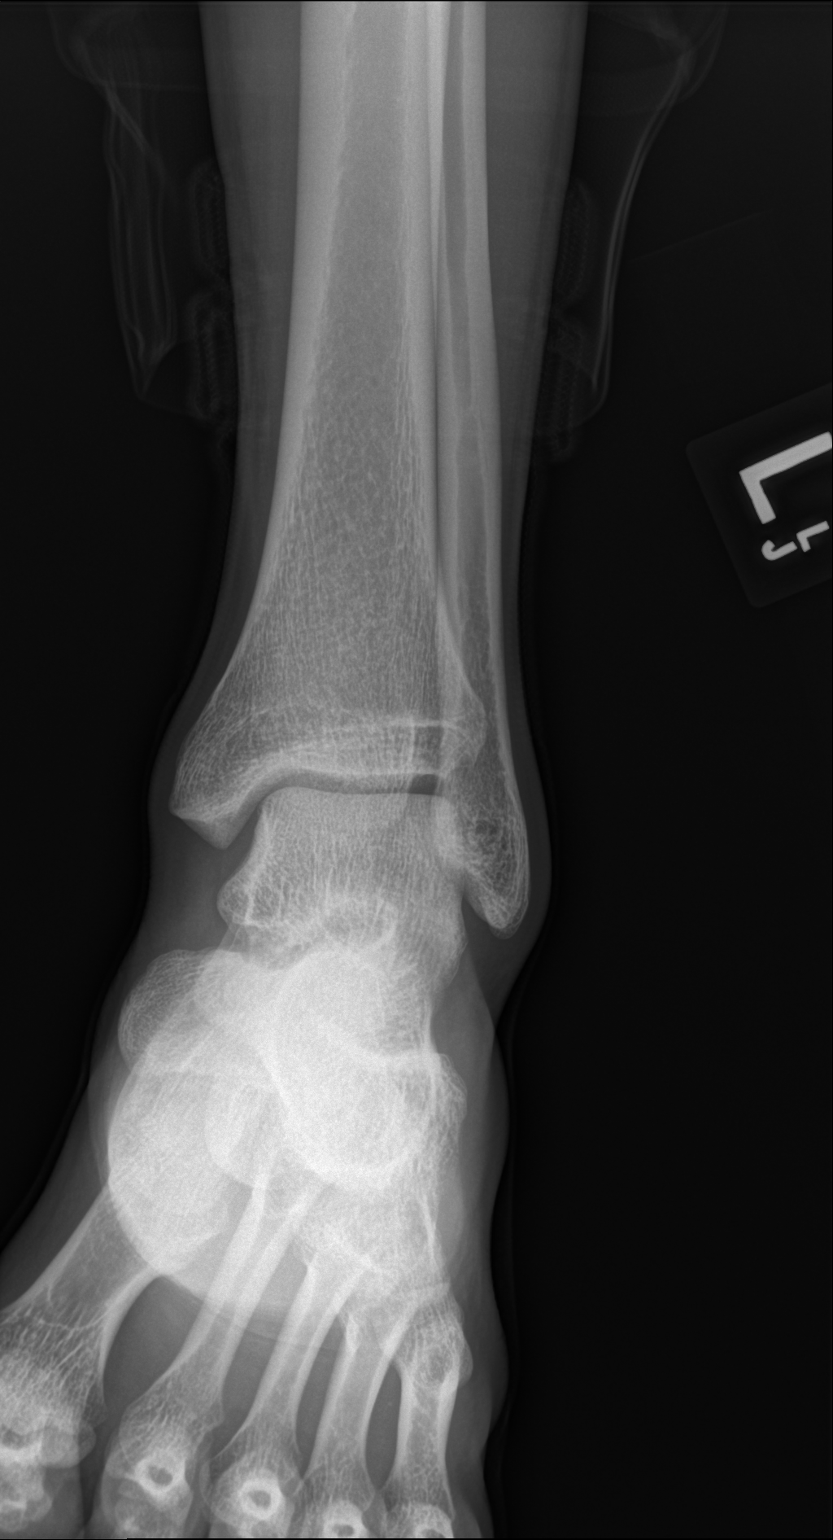

[x ankle obl left]
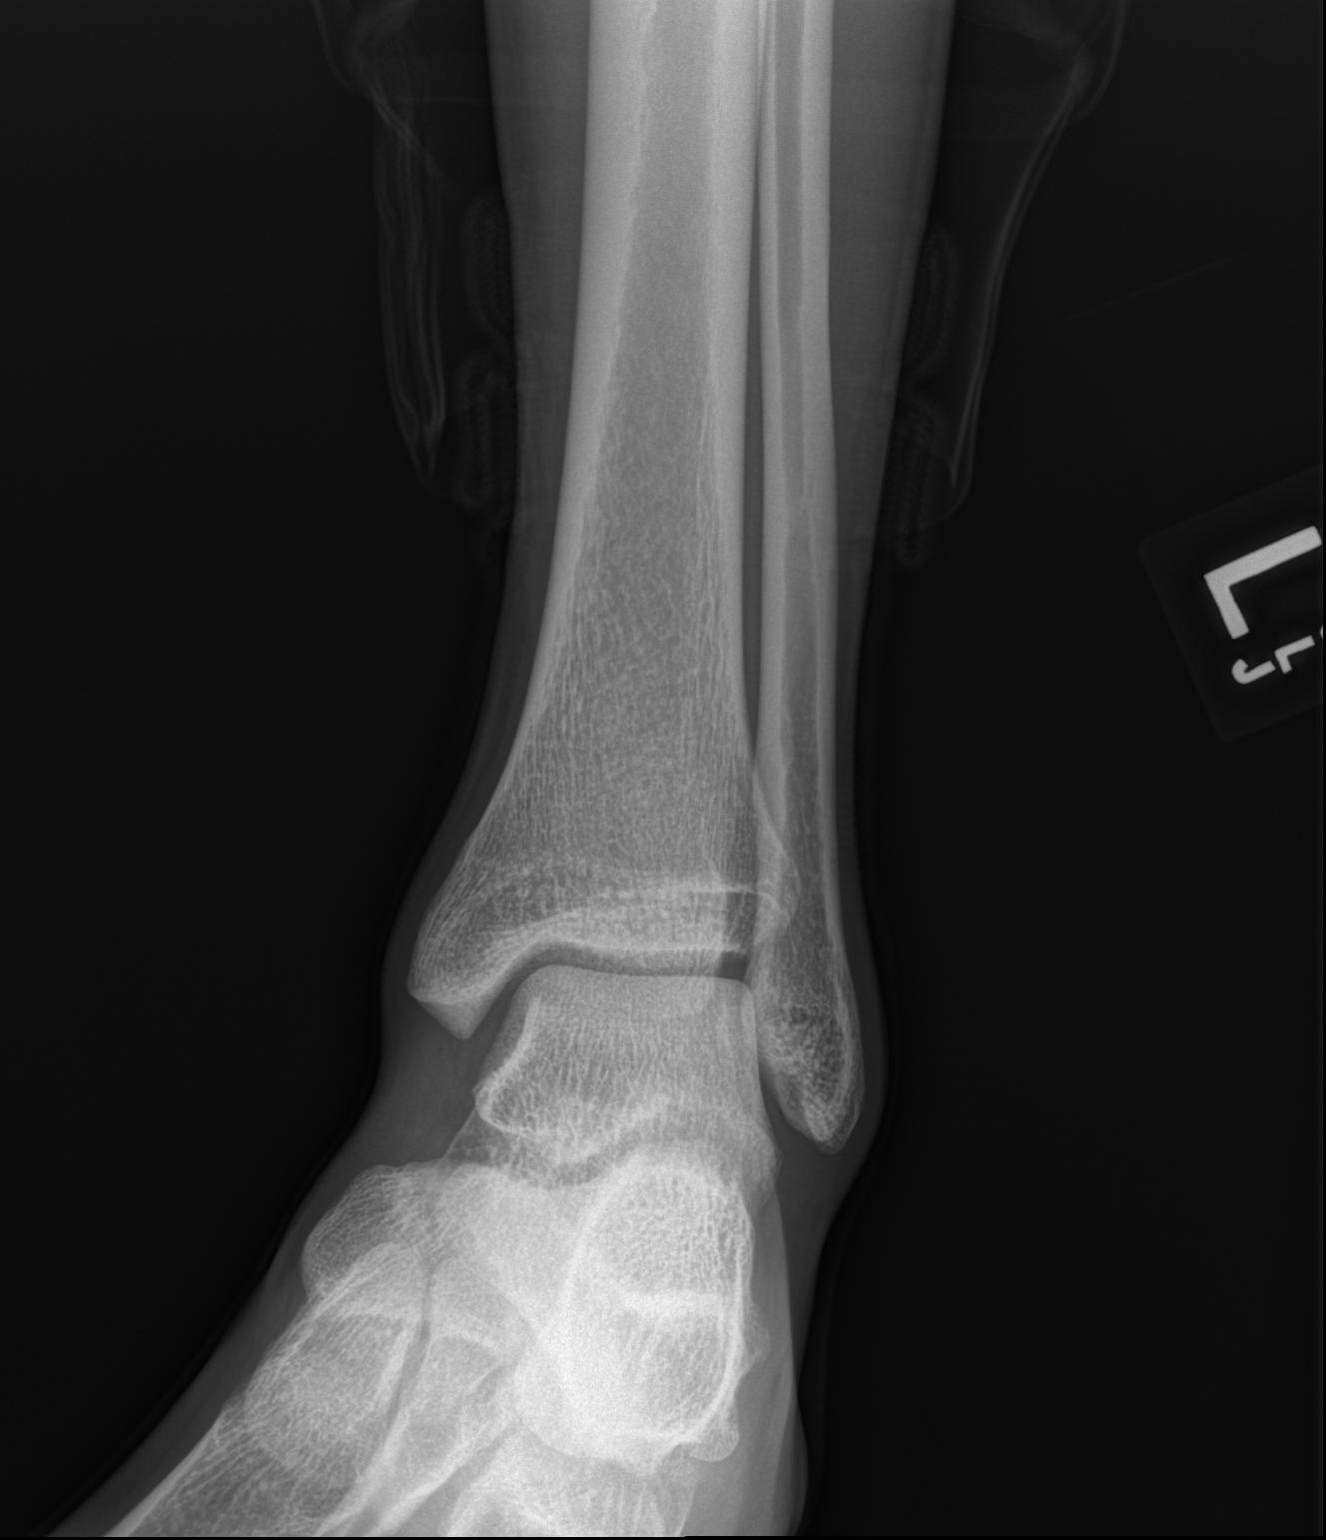

[x ankle lat left]
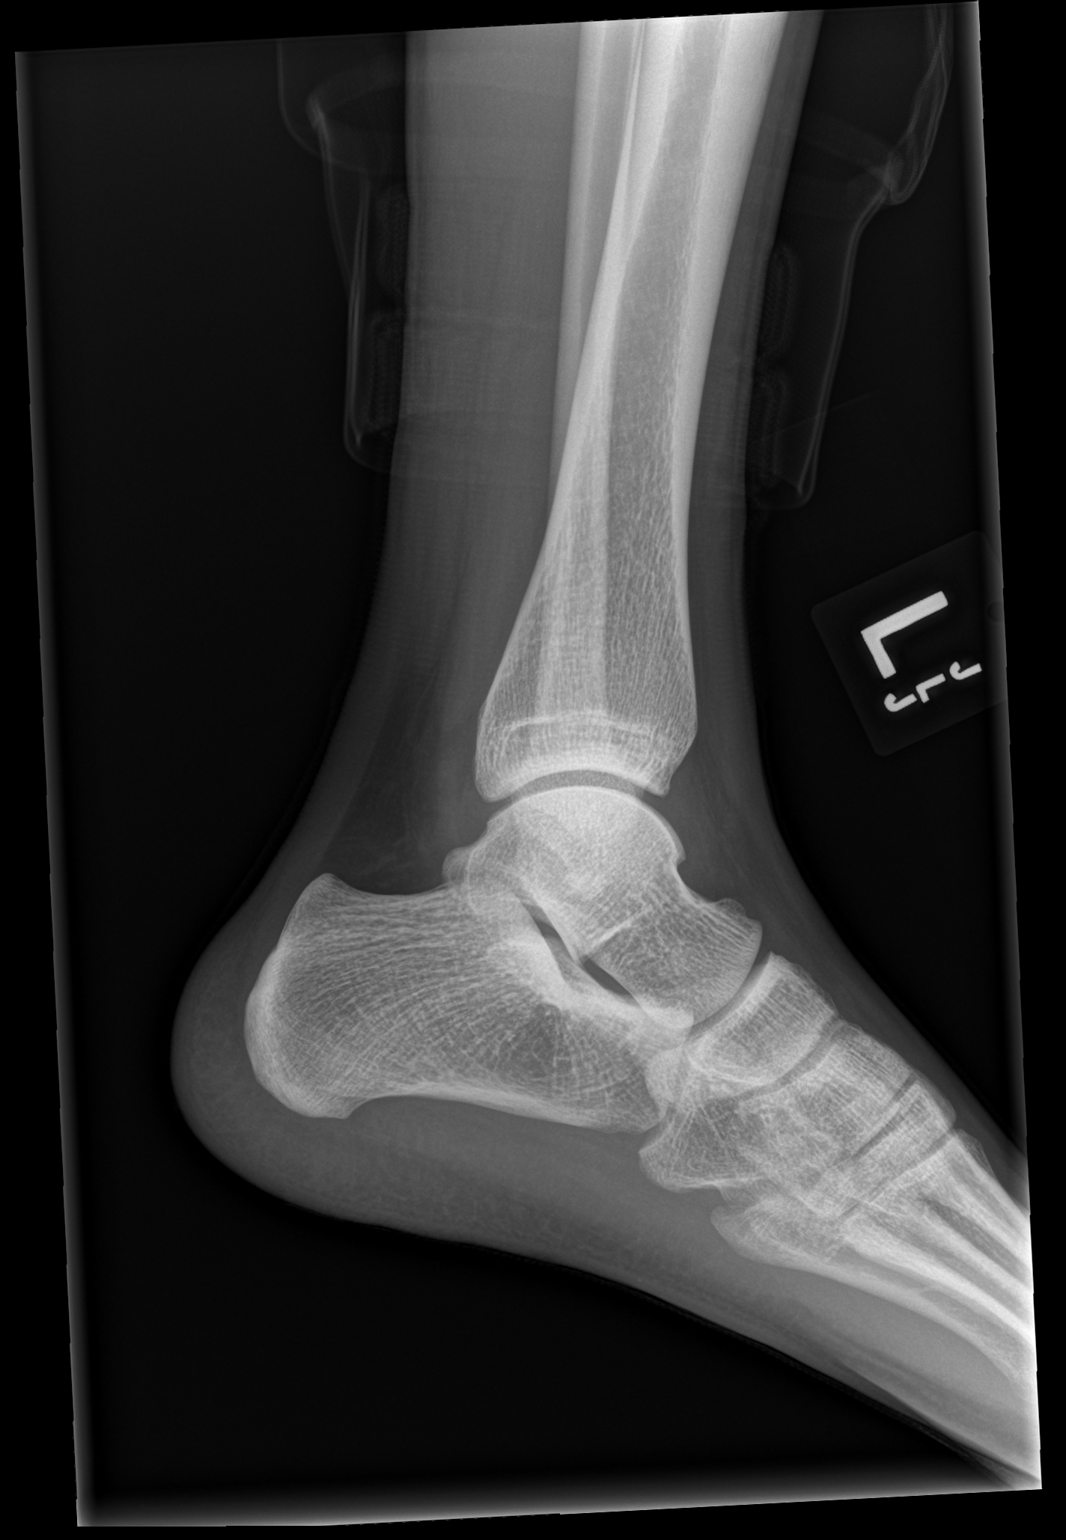

[3 of 3 positions shown; findings below may reference images not displayed]

FINDINGS: There is no evidence of fracture, dislocation, or joint effusion.
There is no evidence of arthropathy or other focal bone abnormality.
Soft tissues are unremarkable.
IMPRESSION: Negative.

## 2019-10-20 NOTE — ED Triage Notes (Addendum)
Patient is complaining of left ankle pain. Patient states it happened playing basketball today. Patient walked to triage room.

## 2019-10-21 NOTE — ED Provider Notes (Signed)
[facility] DEPT Provider Note   CSN: [id number] Arrival date & time: 10/20/19  2248     History Chief Complaint  Patient presents with  . Ankle Injury    Terrence Riley is a 19 y.o. male.   19 year old male presents for left ankle pain which began while playing basketball.  Pain has been constant, unchanged.  No medications taken prior to arrival.  Denies numbness or tingling in the foot.  No known prior L ankle injury or fx.  The history is provided by the patient. No language interpreter was used.  Ankle Injury This is a new problem. The current episode started 3 to 5 hours ago. The problem has not changed since onset.The symptoms are aggravated by walking. Nothing relieves the symptoms. He has tried rest for the symptoms. The treatment provided no relief.       Past Medical History:  Diagnosis Date  . Allergy    rhinitis  . Asthma    as a child  . Eosinophilic esophagitis    heartburn and reflux  . Family history of adverse reaction to anesthesia    mother respiratory failure  . Nasal fracture    deviated septum  . Premature baby    2 months early  . Seizures ([place])    well controlled on meds/ one 2 months ago when meds were late    There are no problems to display for this patient.   Past Surgical History:  Procedure Laterality Date  . ADENOIDECTOMY    . CLOSED REDUCTION NASAL FRACTURE N/A 08/31/2017   Procedure: CLOSED REDUCTION NASAL FRACTURE;  Surgeon: Clyde Canterbury, MD;  Location: [facility];  Service: ENT;  Laterality: N/A;  . SEPTOPLASTY N/A 08/31/2017   Procedure: SEPTOPLASTY;  Surgeon: Clyde Canterbury, MD;  Location: [facility];  Service: ENT;  Laterality: N/A;  . TONSILLECTOMY     and addenoids       History reviewed. No pertinent family history.  Social History   Tobacco Use  . Smoking status: Never Smoker  . Smokeless tobacco: Never Used  Substance Use Topics  . Alcohol use: No  . Drug use:  No    Home Medications Prior to Admission medications   Medication Sig Start Date End Date Taking? Authorizing Provider  albuterol (PROVENTIL HFA;VENTOLIN HFA) 108 (90 Base) MCG/ACT inhaler Inhale 2 puffs into the lungs every 4 (four) hours as needed for wheezing.  11/08/17   [provider]  albuterol (PROVENTIL) (2.5 MG/3ML) 0.083% nebulizer solution Take 3 mLs (2.5 mg total) by nebulization every 4 (four) hours as needed for wheezing or shortness of breath. [id number]   Delora Fuel, MD  budesonide (PULMICORT) 0.25 MG/2ML nebulizer solution Take 0.25 mg by nebulization 2 (two) times daily as needed. Wheezing    [provider]  cetirizine (ZYRTEC) 10 MG tablet Take 10 mg by mouth daily.     [provider]  Cholecalciferol (VITAMIN D-1000 MAX ST) 25 MCG (1000 UT) tablet Take 1,000 Units by mouth daily.  06/13/18   [provider]  clonazePAM (KLONOPIN) 0.5 MG tablet Take 0.5 mg by mouth daily. bedtime    [provider]  diazepam (DIASAT) 20 MG GEL Place 20 mg rectally once as needed. seizures 09/14/16   [provider]  EPINEPHrine 0.3 mg/0.3 mL IJ SOAJ injection Inject 0.3 mg into the muscle once as needed for anaphylaxis. Allergic reaction    [provider]  levETIRAcetam (KEPPRA) 1000 MG tablet Take  1,000 mg by mouth 2 (two) times daily. Am and pm    [provider]  midazolam (VERSED) 5 MG/ML injection Place 2 mLs into the nose every 5 (five) minutes as needed (seizures). Draw up prescribed dose (ml) in syringe, remove blue vial access device, then attach syringe to nasal atomizer for intranasal administration.     [provider]  PRESCRIPTION MEDICATION Apply 1 application topically 3 (three) times daily as needed. Nausea - Phenergan Cream-    [provider]  topiramate (TOPAMAX) 200 MG tablet Take 200 mg by mouth 2 (two) times daily.     [provider]    Allergies    Lortab  [hydrocodone-acetaminophen], Other, Zofran [ondansetron hcl], Citrus, Dairy aid [lactase], Flu virus vaccine, Soy allergy, Tylenol [acetaminophen], Eggs or egg-derived products, and Hydrocodone-acetaminophen  Review of Systems   Review of Systems  Ten systems reviewed and are negative for acute change, except as noted in the HPI.    Physical Exam Updated Vital Signs BP 103/74 (BP Location: Left Arm)   Pulse 90   Temp 98.1 F (36.7 C) (Oral)   Resp 18   Ht 6' (1.829 m)   Wt 61.2 kg   SpO2 96%   BMI 18.31 kg/m   Physical Exam Vitals and nursing note reviewed.  Constitutional:      General: He is not in acute distress.    Appearance: He is well-developed. He is not diaphoretic.     Comments: Nontoxic appearing  HENT:     Head: Normocephalic and atraumatic.  Eyes:     General: No scleral icterus.    Conjunctiva/sclera: Conjunctivae normal.  Cardiovascular:     Rate and Rhythm: Regular rhythm. Bradycardia present.     Comments: DP pulse 2+ in the LLE Pulmonary:     Effort: Pulmonary effort is normal. No respiratory distress.  Musculoskeletal:        General: Normal range of motion.     Cervical back: Normal range of motion.     Left ankle: No swelling or deformity. Tenderness present over the lateral malleolus (mild). Normal pulse.     Left Achilles Tendon: Normal. Thompson's test negative.  Skin:    General: Skin is warm and dry.     Coloration: Skin is not pale.     Findings: No erythema or rash.  Neurological:     Mental Status: He is alert and oriented to person, place, and time.     Comments: Patient able to wiggle all toes of L foot. Ambulatory without difficulty.  Psychiatric:        Behavior: Behavior normal.     ED Results / Procedures / Treatments   Labs (all labs ordered are listed, but only abnormal results are displayed) Labs Reviewed - No data to display  EKG None  Radiology DG Ankle Complete Left  Result Date: 10/20/2019 CLINICAL DATA:  Ankle  injury. EXAM: LEFT ANKLE COMPLETE - 3+ VIEW COMPARISON:  None. FINDINGS: There is no evidence of fracture, dislocation, or joint effusion. There is no evidence of arthropathy or other focal bone abnormality. Soft tissues are unremarkable. IMPRESSION: Negative. Electronically Signed   By: Constance Holster M.D.   On: 10/20/2019 23:29    Procedures Procedures (including critical care time)  Medications Ordered in ED Medications - No data to display  ED Course  I have reviewed the triage vital signs and the nursing notes.  Pertinent labs & imaging results that were available during my care of the  patient were reviewed by me and considered in my medical decision making (see chart for details).    MDM Rules/Calculators/A&P                      Patient presents to the emergency department for evaluation of L ankle pain. Patient neurovascularly intact on exam. Imaging negative for fracture, dislocation, bony deformity. No swelling, erythema, heat to touch to the affected area; no concern for septic joint. Compartments in the affected extremity are soft. Plan for supportive management including RICE and NSAIDs; primary care follow up as needed. Return precautions discussed and provided. Patient discharged in stable condition with no unaddressed concerns.   Final Clinical Impression(s) / ED Diagnoses Final diagnoses:  Sprain of left ankle, unspecified ligament, initial encounter    Rx / DC Orders ED Discharge Orders    None       Antonietta Breach, PA-C 10/21/19 0007    Ripley Fraise, MD 10/21/19 [phone]

## 2019-12-13 ENCOUNTER — Encounter (HOSPITAL_COMMUNITY): Payer: Self-pay

## 2019-12-13 ENCOUNTER — Emergency Department (HOSPITAL_COMMUNITY): Payer: Commercial Managed Care - PPO

## 2019-12-13 ENCOUNTER — Emergency Department (HOSPITAL_COMMUNITY)
Admission: EM | Admit: 2019-12-13 | Discharge: 2019-12-13 | Disposition: A | Discharge status: Home / Self Care | Payer: Commercial Managed Care - PPO | Attending: Emergency Medicine | Admitting: Emergency Medicine

## 2019-12-13 ENCOUNTER — Other Ambulatory Visit: Payer: Self-pay

## 2019-12-13 DIAGNOSIS — J45909 Unspecified asthma, uncomplicated: Secondary | ICD-10-CM | POA: Diagnosis not present

## 2019-12-13 DIAGNOSIS — Y9389 Activity, other specified: Secondary | ICD-10-CM | POA: Diagnosis not present

## 2019-12-13 DIAGNOSIS — Y999 Unspecified external cause status: Secondary | ICD-10-CM | POA: Insufficient documentation

## 2019-12-13 DIAGNOSIS — Y92003 Bedroom of unspecified non-institutional (private) residence as the place of occurrence of the external cause: Secondary | ICD-10-CM | POA: Diagnosis not present

## 2019-12-13 DIAGNOSIS — S4992XA Unspecified injury of left shoulder and upper arm, initial encounter: Secondary | ICD-10-CM | POA: Diagnosis present

## 2019-12-13 DIAGNOSIS — Z79899 Other long term (current) drug therapy: Secondary | ICD-10-CM | POA: Insufficient documentation

## 2019-12-13 DIAGNOSIS — X509XXA Other and unspecified overexertion or strenuous movements or postures, initial encounter: Secondary | ICD-10-CM | POA: Insufficient documentation

## 2019-12-13 DIAGNOSIS — S43005A Unspecified dislocation of left shoulder joint, initial encounter: Secondary | ICD-10-CM | POA: Diagnosis not present

## 2019-12-13 IMAGING — CR DG SHOULDER 2+V*L*
3 series · 3 of 3 positions shown · non-contrast
Comparison: None.

CLINICAL DATA: Left shoulder pain

EXAM:
LEFT SHOULDER - 2+ VIEW

[w shoulder external left]
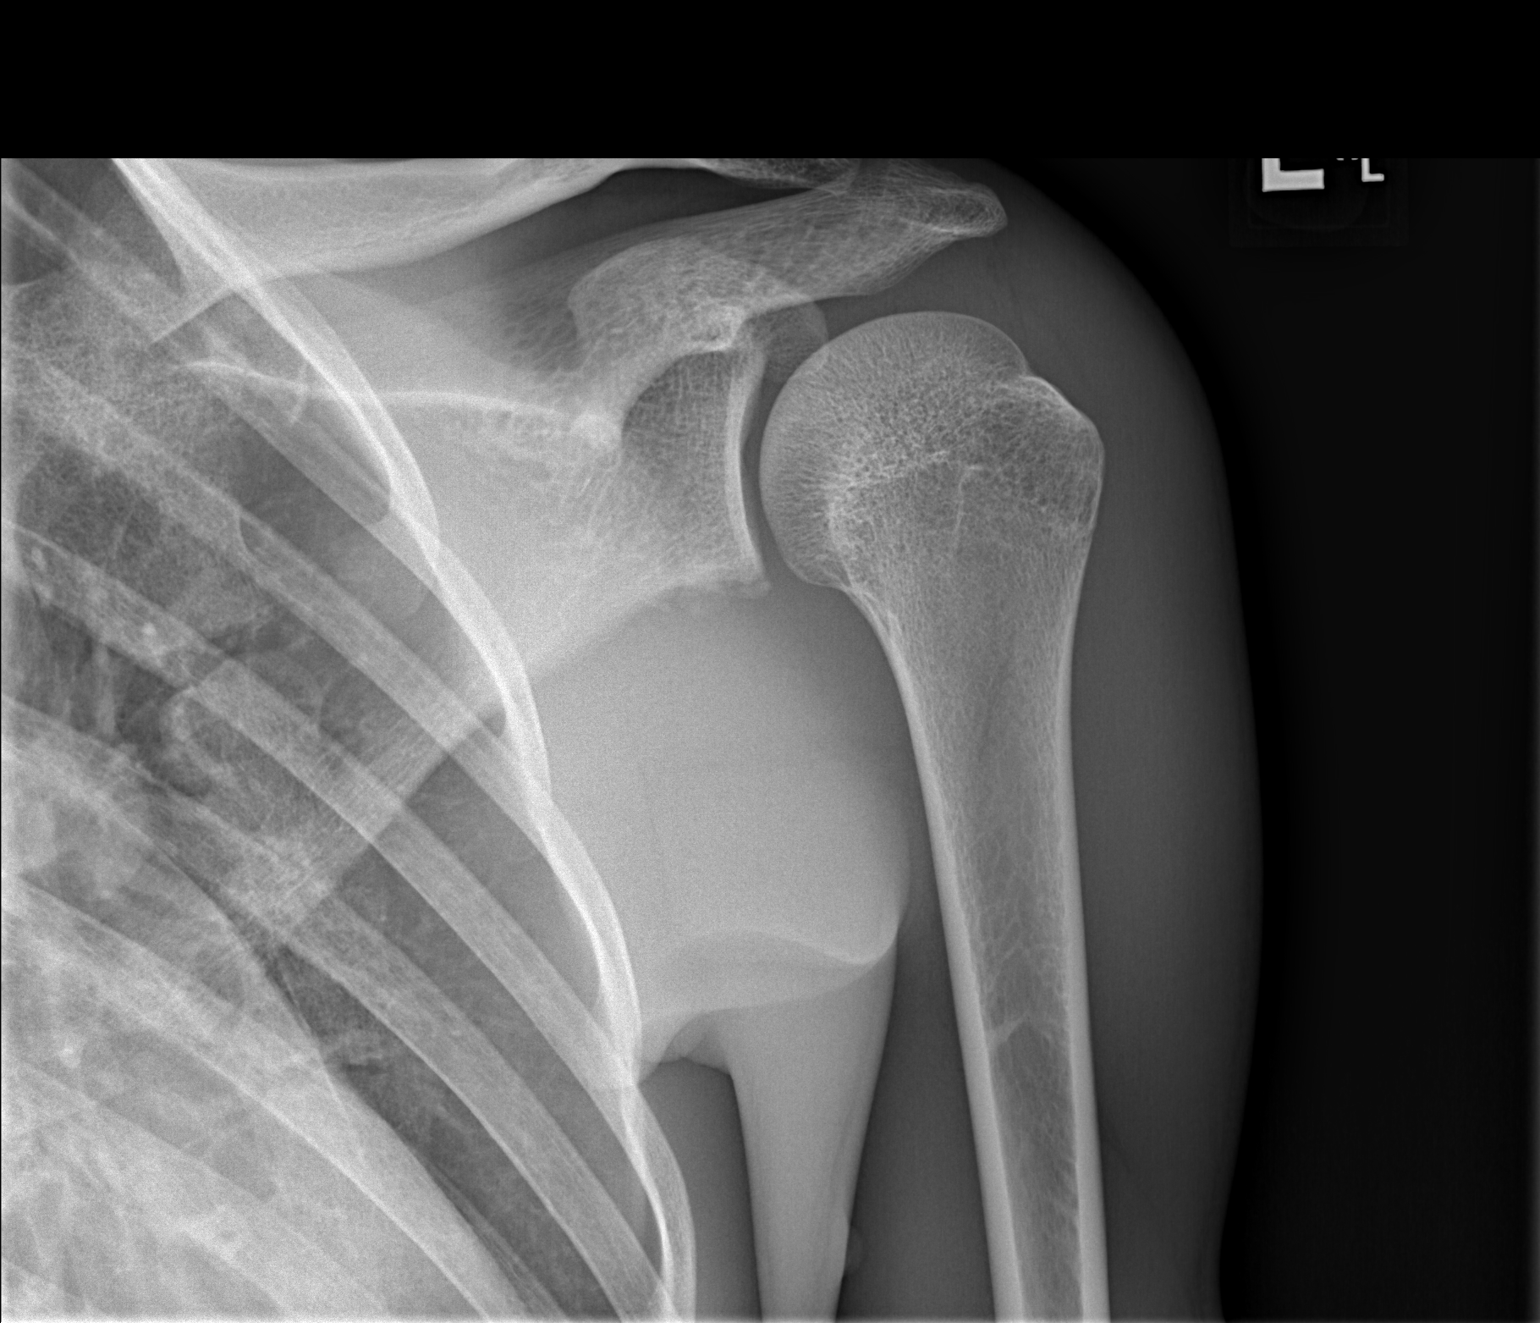

[w shoulder y-view left]
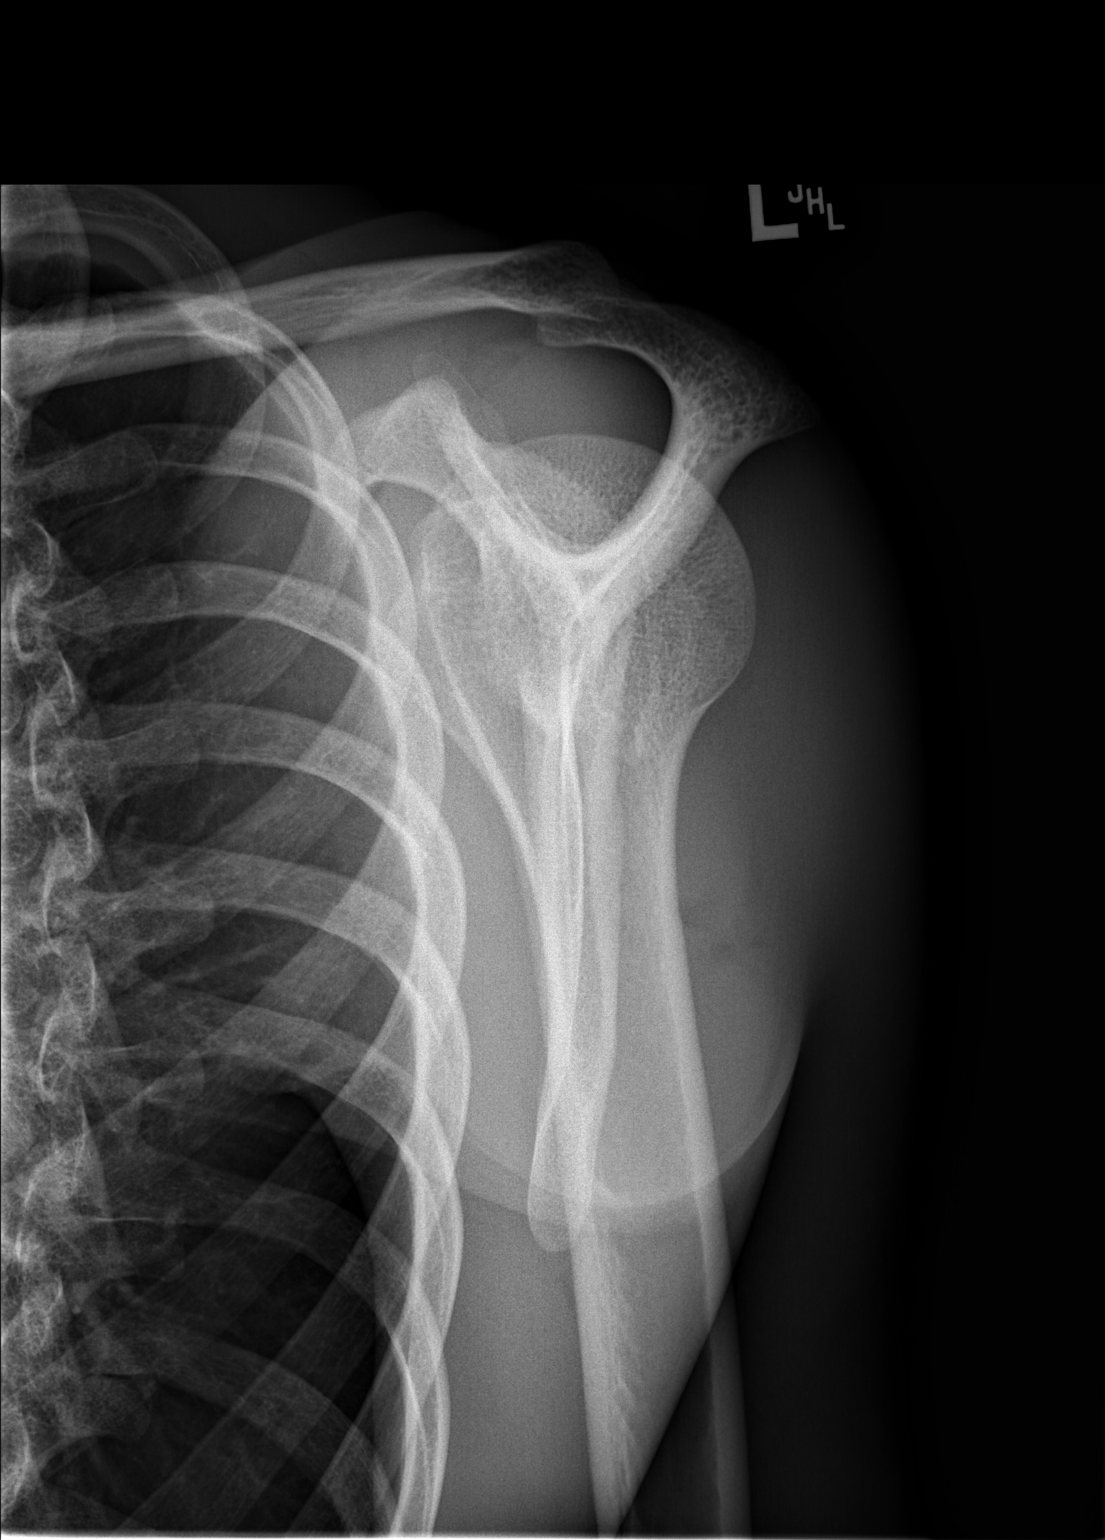

[x shoulder axillary left]
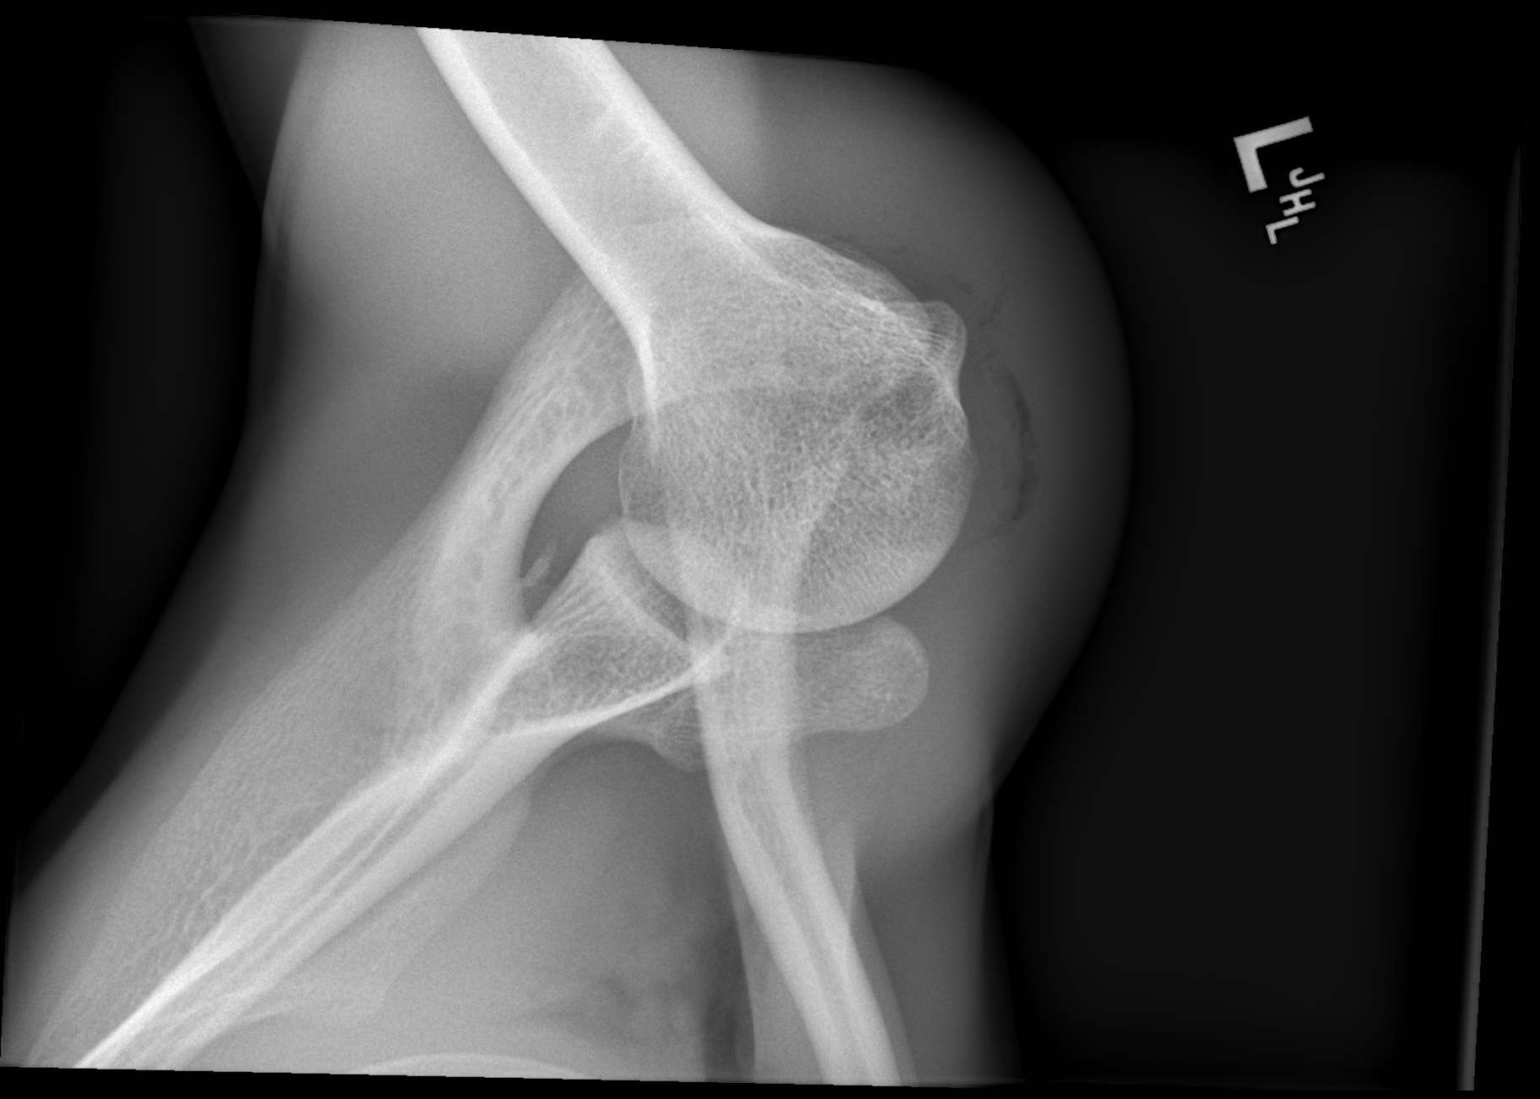

[3 of 3 positions shown; findings below may reference images not displayed]

FINDINGS: No active dislocation. Small bony fragment along the medial anterior
inferior aspect of the glenoid likely reflecting a bony Bankart
lesion. No other acute fracture.
IMPRESSION: Small bony fragment along the medial anterior inferior aspect of the
glenoid likely reflecting a bony Bankart lesion.

## 2019-12-13 IMAGING — DX DG SHOULDER 1V*L*
3 series · 3 of 3 positions shown · non-contrast
Comparison: [DATE]

CLINICAL DATA: Shoulder dislocation

EXAM:
LEFT SHOULDER

[shoulder ap (1 of 3)]
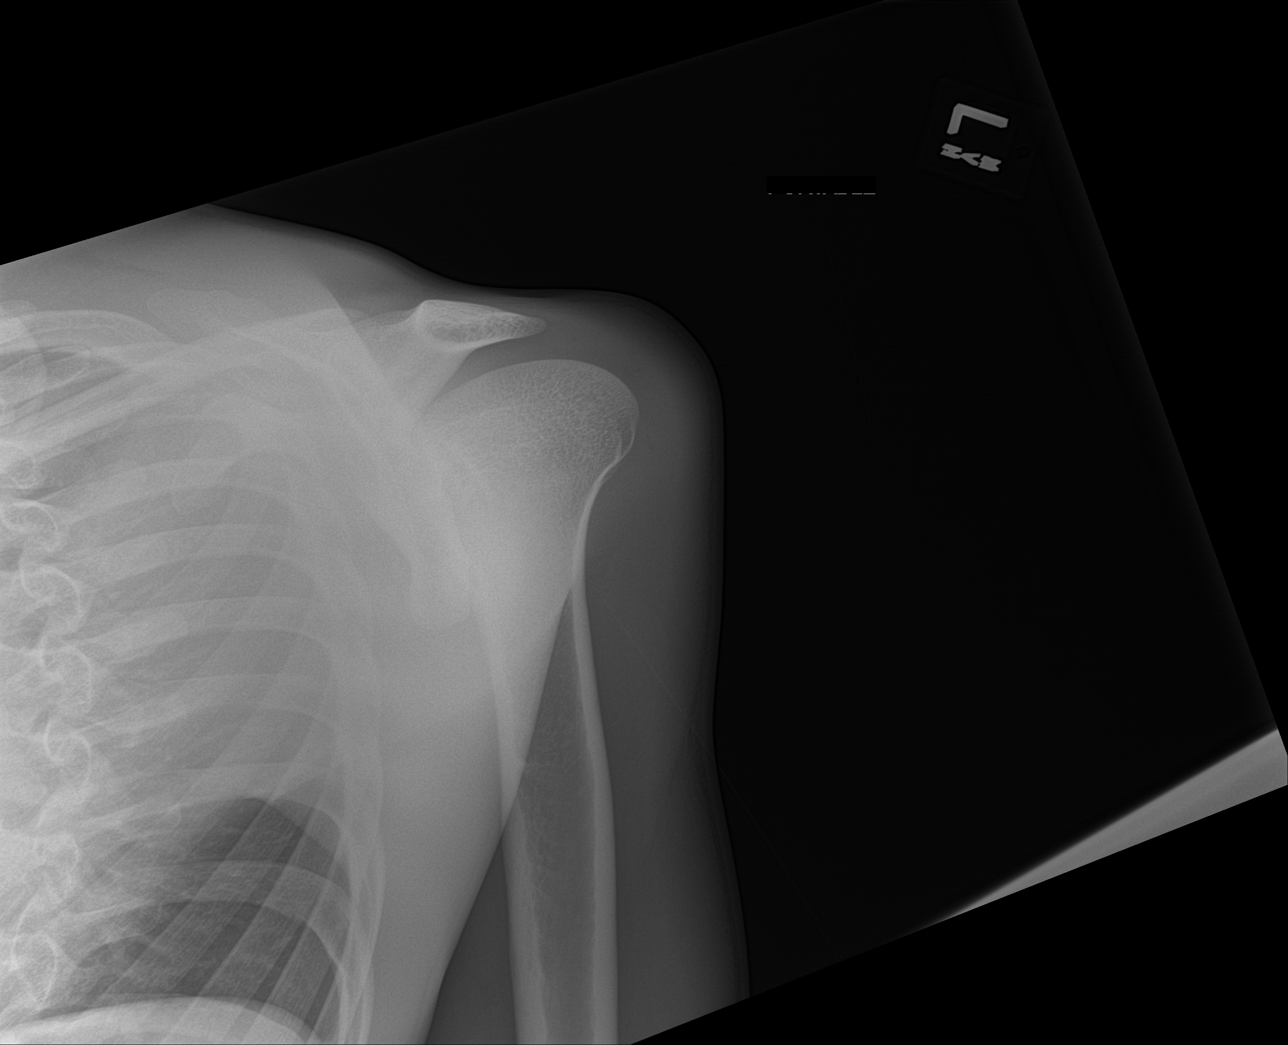

[shoulder ap (2 of 3)]
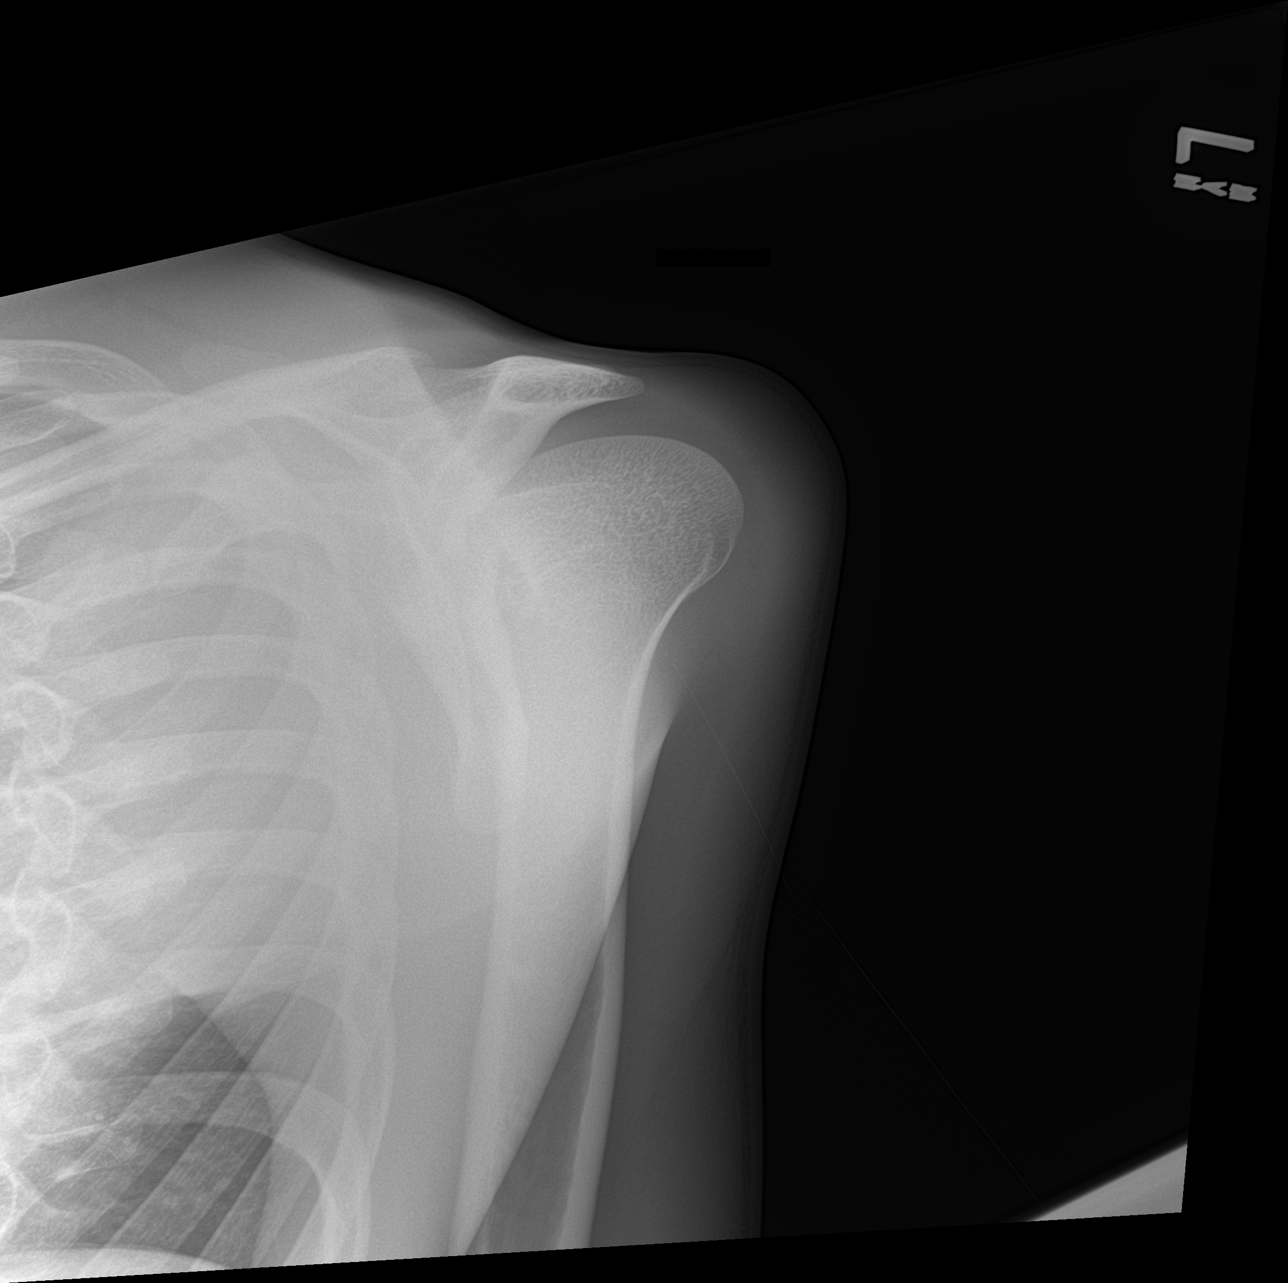

[shoulder ap (3 of 3)]
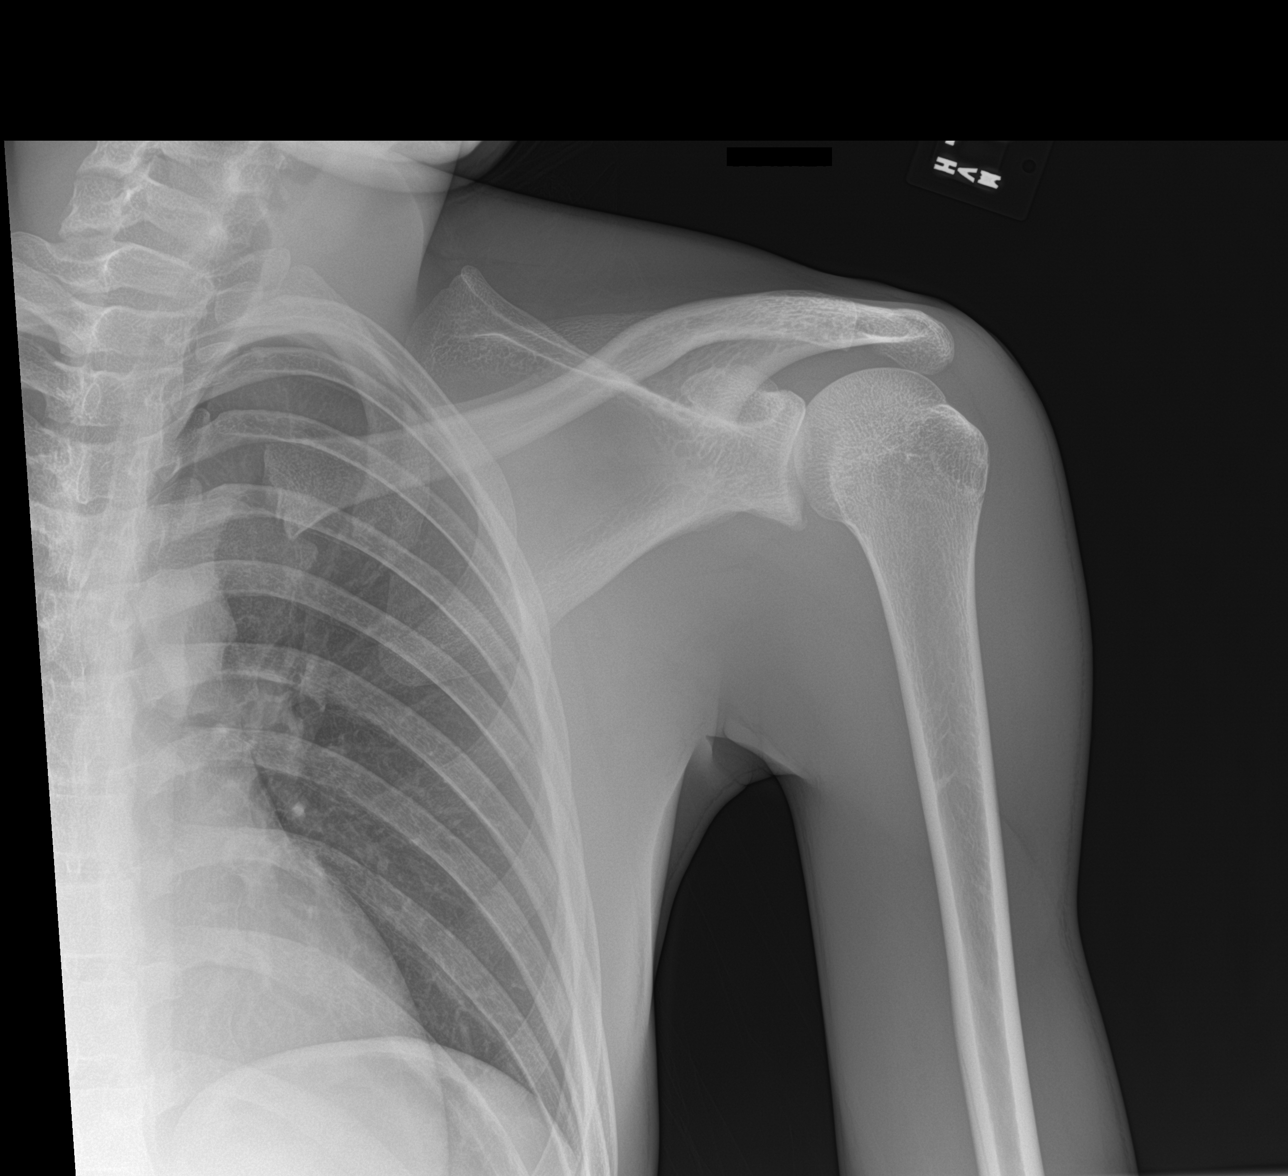

[3 of 3 positions shown; findings below may reference images not displayed]

FINDINGS: Frontal and transscapular views of the left shoulder are obtained.
The transscapular view is suboptimal due to positioning and portable
technique. There is either posterior dislocation or severe posterior
subluxation of the glenohumeral joint. No acute displaced fracture.
Left chest is clear.
IMPRESSION: 1. Either severe posterior subluxation or posterior dislocation of
the glenohumeral joint based on transscapular view.
2. No acute fracture.

## 2019-12-13 MED ORDER — LORAZEPAM 2 MG/ML IJ SOLN
1.0000 mg | Freq: Once | INTRAMUSCULAR | Status: AC
  Administered 2019-12-13: 1 mg via INTRAVENOUS
  Filled 2019-12-13: qty 1

## 2019-12-13 MED ORDER — LIDOCAINE HCL 2 % IJ SOLN
10.0000 mL | Freq: Once | INTRAMUSCULAR | Status: AC
  Administered 2019-12-13: 200 mg
  Filled 2019-12-13: qty 20

## 2019-12-13 MED ORDER — FENTANYL CITRATE (PF) 100 MCG/2ML IJ SOLN
25.0000 ug | Freq: Once | INTRAMUSCULAR | Status: AC
  Administered 2019-12-13: 25 ug via INTRAVENOUS
  Filled 2019-12-13: qty 2

## 2019-12-13 NOTE — ED Triage Notes (Signed)
Pt arrived via [facility] from [facility]  Left shoulder pain r/t "dislocation". Pt denies fall or serious injury., " he was making his bed and it popped out".  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   Hx Epilepsy

## 2019-12-13 NOTE — ED Provider Notes (Signed)
[facility] DEPT Provider Note   CSN: [id number] Arrival date & time: 12/13/19  1445     History Chief Complaint  Patient presents with  . Shoulder Pain    Left     Terrence Riley is a 19 y.o. male with a past medical history of seizures presenting to the ED with a chief complaint of possible shoulder dislocation.  This afternoon approximately 1 hour ago was trying to fix his bed when he felt like his left shoulder popped out of place.  States that this is happened to him in the past several times this year and he is usually able to reduce it on his own.  This initially happened several months ago when he was playing basketball and someone pulled on his arm.  He denies any injuries or falls, numbness in arms or legs, prior surgeries.  He has not seen orthopedist regarding this recurrent dislocation in the past.  HPI     Past Medical History:  Diagnosis Date  . Allergy    rhinitis  . Asthma    as a child  . Eosinophilic esophagitis    heartburn and reflux  . Family history of adverse reaction to anesthesia    mother respiratory failure  . Nasal fracture    deviated septum  . Premature baby    2 months early  . Seizures ([place])    well controlled on meds/ one 2 months ago when meds were late    There are no problems to display for this patient.   Past Surgical History:  Procedure Laterality Date  . ADENOIDECTOMY    . CLOSED REDUCTION NASAL FRACTURE N/A 08/31/2017   Procedure: CLOSED REDUCTION NASAL FRACTURE;  Surgeon: Clyde Canterbury, MD;  Location: [place];  Service: ENT;  Laterality: N/A;  . SEPTOPLASTY N/A 08/31/2017   Procedure: SEPTOPLASTY;  Surgeon: Clyde Canterbury, MD;  Location: [place];  Service: ENT;  Laterality: N/A;  . TONSILLECTOMY     and addenoids       History reviewed. No pertinent family history.  Social History   Tobacco Use  . Smoking status: Never Smoker  . Smokeless tobacco: Never Used    Substance Use Topics  . Alcohol use: No  . Drug use: No    Home Medications Prior to Admission medications   Medication Sig Start Date End Date Taking? Authorizing Provider  albuterol (PROVENTIL HFA;VENTOLIN HFA) 108 (90 Base) MCG/ACT inhaler Inhale 2 puffs into the lungs every 4 (four) hours as needed for wheezing.  11/08/17  Yes [provider]  albuterol (PROVENTIL) (2.5 MG/3ML) 0.083% nebulizer solution Take 3 mLs (2.5 mg total) by nebulization every 4 (four) hours as needed for wheezing or shortness of breath. [id number]  Yes Delora Fuel, MD  budesonide (PULMICORT) 0.25 MG/2ML nebulizer solution Take 0.25 mg by nebulization 2 (two) times daily as needed (sob/wheezing). Wheezing   Yes [provider]  cetirizine (ZYRTEC) 10 MG tablet Take 10 mg by mouth daily as needed for allergies.    Yes [provider]  Cholecalciferol (VITAMIN D-1000 MAX ST) 25 MCG (1000 UT) tablet Take 1,000 Units by mouth daily.  06/13/18  Yes [provider]  clonazePAM (KLONOPIN) 0.5 MG tablet Take 0.5 mg by mouth daily. bedtime   Yes [provider]  EPINEPHrine 0.3 mg/0.3 mL IJ SOAJ injection Inject 0.3 mg into the muscle once as needed for anaphylaxis. Allergic reaction   Yes [provider]  levETIRAcetam (KEPPRA) 1000  MG tablet Take 1,000 mg by mouth 2 (two) times daily. Am and pm   Yes [provider]  midazolam (VERSED) 5 MG/ML injection Place 2 mLs into the nose every 5 (five) minutes as needed (seizures). Draw up prescribed dose (ml) in syringe, remove blue vial access device, then attach syringe to nasal atomizer for intranasal administration.    Yes [provider]  PRESCRIPTION MEDICATION Apply 1 application topically 3 (three) times daily as needed. Nausea - Phenergan Cream-   Yes [provider]  topiramate (TOPAMAX) 200 MG tablet Take 200 mg by mouth 2 (two) times daily.    Yes [provider]    Allergies     Lortab [hydrocodone-acetaminophen], Other, Zofran [ondansetron hcl], Citrus, Dairy aid [lactase], Flu virus vaccine, Soy allergy, Tylenol [acetaminophen], Eggs or egg-derived products, and Hydrocodone-acetaminophen  Review of Systems   Review of Systems  Constitutional: Negative for appetite change, chills and fever.  HENT: Negative for ear pain, rhinorrhea, sneezing and sore throat.   Eyes: Negative for photophobia and visual disturbance.  Respiratory: Negative for cough, chest tightness, shortness of breath and wheezing.   Cardiovascular: Negative for chest pain and palpitations.  Gastrointestinal: Negative for abdominal pain, blood in stool, constipation, diarrhea, nausea and vomiting.  Genitourinary: Negative for dysuria, hematuria and urgency.  Musculoskeletal: Positive for arthralgias. Negative for myalgias.  Skin: Negative for rash.  Neurological: Negative for dizziness, weakness and light-headedness.    Physical Exam Updated Vital Signs BP (!) 138/98   Pulse 67   Temp 98 F (36.7 C) (Oral)   Resp 19   SpO2 100%   Physical Exam Vitals and nursing note reviewed.  Constitutional:      General: He is not in acute distress.    Appearance: He is well-developed.  HENT:     Head: Normocephalic and atraumatic.     Nose: Nose normal.  Eyes:     General: No scleral icterus.       Left eye: No discharge.     Conjunctiva/sclera: Conjunctivae normal.  Cardiovascular:     Rate and Rhythm: Normal rate and regular rhythm.     Heart sounds: Normal heart sounds. No murmur. No friction rub. No gallop.   Pulmonary:     Effort: Pulmonary effort is normal. No respiratory distress.     Breath sounds: Normal breath sounds.  Abdominal:     General: Bowel sounds are normal. There is no distension.     Palpations: Abdomen is soft.     Tenderness: There is no abdominal tenderness. There is no guarding.  Musculoskeletal:        General: Deformity (Left shoulder) present. Normal range of  motion.     Cervical back: Normal range of motion and neck supple.     Comments: 2+ radial pulse noted bilaterally. Sling in place.  Skin:    General: Skin is warm and dry.     Findings: No rash.  Neurological:     Mental Status: He is alert.     Motor: No abnormal muscle tone.     Coordination: Coordination normal.     ED Results / Procedures / Treatments   Labs (all labs ordered are listed, but only abnormal results are displayed) Labs Reviewed - No data to display  EKG None  Radiology DG Shoulder Left  Result Date: 12/13/2019 CLINICAL DATA:  Left shoulder pain EXAM: LEFT SHOULDER - 2+ VIEW COMPARISON:  None. FINDINGS: No active dislocation. 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 No other acute fracture. IMPRESSION: Small bony fragment along the medial anterior inferior aspect of the glenoid likely reflecting a bony Bankart lesion. Electronically Signed   By: Kathreen Devoid   On: 12/13/2019 17:50   DG Shoulder Left Portable  Result Date: 12/13/2019 CLINICAL DATA:  Shoulder dislocation EXAM: LEFT SHOULDER COMPARISON:  05/14/2019 FINDINGS: Frontal and transscapular views of the left shoulder are obtained. The transscapular view is suboptimal due to positioning and portable technique. There is either posterior dislocation or severe posterior subluxation of the glenohumeral joint. No acute displaced fracture. Left chest is clear. IMPRESSION: 1. Either severe posterior subluxation or posterior dislocation of the glenohumeral joint based on transscapular view. 2. No acute fracture. Electronically Signed   By: Randa Ngo M.D.   On: 12/13/2019 16:23    Procedures Procedures (including critical care time)  Medications Ordered in ED Medications  lidocaine (XYLOCAINE) 2 % (with pres) injection 200 mg (has no administration in time range)  fentaNYL (SUBLIMAZE) injection 25 mcg (25 mcg Intravenous Given 12/13/19  1638)  LORazepam (ATIVAN) injection 1 mg (1 mg Intravenous Given 12/13/19 1638)    ED Course  I have reviewed the triage vital signs and the nursing notes.  Pertinent labs & imaging results that were available during my care of the patient were reviewed by me and considered in my medical decision making (see chart for details).    MDM Rules/Calculators/A&P                      19 year old male presenting to the ED with a chief complaint of left shoulder dislocation.  History of dislocations in the past which she is able to self reduce.  He has not seen orthopedist for this.  This happened today while he was trying to make his bed with moving the sheets over.  Deformity noted on exam.  Limited range of motion.  X-ray showing possible posterior dislocation versus subluxation.  Hematoma block and reduction was performed by Dr. Darl Householder.  Post reduction film showing no dislocation.  Stressed importance of following up with orthopedist.  Patient placed in shoulder immobilizer.  Advised to return for any worsening symptoms.  All imaging, if done today, including plain films, CT scans, and ultrasounds, independently reviewed by me, and interpretations confirmed via formal radiology reads.  Patient is hemodynamically stable, in NAD, and able to ambulate in the ED. Evaluation does not show pathology that would require ongoing emergent intervention or inpatient treatment. I explained the diagnosis to the patient. Pain has been managed and has no complaints prior to discharge. Patient is comfortable with above plan and is stable for discharge at this time. All questions were answered prior to disposition. Strict return precautions for returning to the ED were discussed. Encouraged follow up with PCP.   An After Visit Summary was printed and given to the patient.   Portions of this note were generated with [employer]. Dictation errors may occur despite best attempts at proofreading.  Final  Clinical Impression(s) / ED Diagnoses Final diagnoses:  Dislocation of left shoulder joint, initial encounter    Rx / DC Orders ED Discharge Orders    None       Delia Heady, PA-C 12/13/19 1809    Drenda Freeze, MD 12/15/19 [phone]

## 2019-12-13 NOTE — Discharge Instructions (Addendum)
You will need to follow-up with the orthopedist listed below for further evaluation and management. Return to the ER for any additional dislocations, injuries or falls, numbness.

## 2019-12-14 ENCOUNTER — Other Ambulatory Visit: Payer: Self-pay

## 2019-12-15 ENCOUNTER — Ambulatory Visit: Payer: Commercial Managed Care - PPO | Admitting: Family Medicine

## 2019-12-15 NOTE — ED Provider Notes (Signed)
  Physical Exam  BP 133/89   Pulse 72   Temp 98 F (36.7 C) (Oral)   Resp 16   SpO2 100%   Physical Exam  ED Course/Procedures     Reduction of dislocation  Date/Time: 12/15/2019 3:19 PM Performed by: Drenda Freeze, MD Authorized by: Drenda Freeze, MD  Consent: Verbal consent obtained. Risks and benefits: risks, benefits and alternatives were discussed Consent given by: patient Patient understanding: patient states understanding of the procedure being performed Patient consent: the patient's understanding of the procedure matches consent given Procedure consent: procedure consent matches procedure scheduled Relevant documents: relevant documents present and verified Required items: required blood products, implants, devices, and special equipment available Patient identity confirmed: verbally with patient Preparation: Patient was prepped and draped in the usual sterile fashion. Local anesthesia used: yes Anesthesia: hematoma block  Anesthesia: Local anesthesia used: yes Local Anesthetic: lidocaine 2% without epinephrine Anesthetic total: 10 mL  Sedation: Patient sedated: no  Patient tolerance: patient tolerated the procedure well with no immediate complications     MDM  Attestation: Medical screening examination/treatment/procedure(s) were conducted as a shared visit with non-physician practitioner(s) and myself.  I personally evaluated the patient during the encounter.  EKG:    Terrence Riley is a 19 y.o. male hx of seizure, here with shoulder dislocation. Patient has hx of shoulder dislocation and was playing basketball and someone pulled on his arm and it dislocated. On exam, it appears to be posteriorly dislocated, confirmed on xray. I performed hematoma block and able to reduce the shoulder. Post reduction xray showed bankart lesion. Placed shoulder immobilizer. Will have him follow up with ortho.       Drenda Freeze, MD 12/15/19 1520

## 2019-12-18 ENCOUNTER — Other Ambulatory Visit: Payer: Self-pay

## 2019-12-18 ENCOUNTER — Ambulatory Visit (INDEPENDENT_AMBULATORY_CARE_PROVIDER_SITE_OTHER): Payer: Commercial Managed Care - PPO | Admitting: Physician Assistant

## 2019-12-18 ENCOUNTER — Encounter: Payer: Self-pay | Admitting: Physician Assistant

## 2019-12-18 DIAGNOSIS — S43005D Unspecified dislocation of left shoulder joint, subsequent encounter: Secondary | ICD-10-CM

## 2019-12-18 NOTE — Progress Notes (Signed)
Office Visit Note   Patient: Terrence Riley           Date of Birth: Feb 27, 2001           MRN: [id number] Visit Date: 12/18/2019              Requested by: Langley Gauss, MD [address] [place],  [place] [address] PCP: Langley Gauss, MD   Assessment & Plan: Visit Diagnoses:  1. Shoulder dislocation, left, subsequent encounter     Plan: We will set him up for an MRI arthrogram of the left shoulder rule out labral tear.  Have him follow-up after the MRI to go over the results and discuss further treatment.  Questions were encouraged and answered.  Follow-Up Instructions: Return after MRI.   Orders:  No orders of the defined types were placed in this encounter.  No orders of the defined types were placed in this encounter.     Procedures: No procedures performed   Clinical Data: No additional findings.   Subjective: Chief Complaint  Patient presents with  . Left Shoulder - Dislocation    HPI Terrence Riley is a 19 year old male who comes in today as a new patient for left shoulder pain.  He is seen in the [facility], ER on 12/13/2019 due to his shoulder dislocation subluxation.  He reports that approximately 3 months ago he was playing basketball and somehow hit him from underneath his shoulder.  He points to the axillary region of the shoulder.  He had had no prior subluxation/dislocations of the shoulder before this event.  He reports now his shoulder can come out with a simple time such as pulling the covers up whenever he is lying in bed.  He comes in today without any type of sling on.  He states he just stopped wearing it.  He has been taking ibuprofen for the pain.  He does reduce port slight numbness tingling around the left shoulder and feeling of something is not right with his pinky finger. Radiographs dated 12/13/2019 3 views of the left shoulder are reviewed showed posterior dislocation of the humeral head.   Postreduction films show the shoulder to be well located.  Inferior aspect of the glenoid small bony fragment consistent with a Bankart lesion.  No other fractures identified. Review of Systems See HPI  Objective: Vital Signs: There were no vitals taken for this visit.  Physical Exam Constitutional:      Appearance: He is not ill-appearing or diaphoretic.  Pulmonary:     Effort: Pulmonary effort is normal.  Neurological:     Mental Status: He is alert and oriented to person, place, and time.  Psychiatric:        Mood and Affect: Mood normal.     Ortho Exam Bilateral shoulders good range of motion.  Negative apprehension test on the left.  5 out of 5 strength with external and internal rotation against resistance.  Full sensation bilateral hands.  Full motor bilateral hands.  He is  hypermobile and able to touch Riley thumbs to his forearms. Specialty Comments:  No specialty comments available.  Imaging: No results found.   PMFS History: There are no problems to display for this patient.  Past Medical History:  Diagnosis Date  . Allergy    rhinitis  . Asthma    as a child  . Eosinophilic esophagitis    heartburn and reflux  . Family history of adverse reaction to  anesthesia    mother respiratory failure  . Nasal fracture    deviated septum  . Premature baby    2 months early  . Seizures ([place])    well controlled on meds/ one 2 months ago when meds were late    History reviewed. No pertinent family history.  Past Surgical History:  Procedure Laterality Date  . ADENOIDECTOMY    . CLOSED REDUCTION NASAL FRACTURE N/A 08/31/2017   Procedure: CLOSED REDUCTION NASAL FRACTURE;  Surgeon: Clyde Canterbury, MD;  Location: [facility];  Service: ENT;  Laterality: N/A;  . SEPTOPLASTY N/A 08/31/2017   Procedure: SEPTOPLASTY;  Surgeon: Clyde Canterbury, MD;  Location: [place];  Service: ENT;  Laterality: N/A;  . TONSILLECTOMY     and addenoids   Social History    Occupational History  . Not on file  Tobacco Use  . Smoking status: Never Smoker  . Smokeless tobacco: Never Used  Substance and Sexual Activity  . Alcohol use: No  . Drug use: No  . Sexual activity: Not on file

## 2019-12-18 NOTE — Addendum Note (Signed)
Addended by: Michae Kava B on: 12/18/2019 04:04 PM   Modules accepted: Orders

## 2019-12-22 ENCOUNTER — Encounter: Payer: Self-pay | Admitting: Family Medicine

## 2019-12-22 ENCOUNTER — Other Ambulatory Visit: Payer: Self-pay

## 2019-12-22 ENCOUNTER — Ambulatory Visit (INDEPENDENT_AMBULATORY_CARE_PROVIDER_SITE_OTHER): Payer: Commercial Managed Care - PPO | Admitting: Family Medicine

## 2019-12-22 VITALS — BP 90/62 | HR 63 | Temp 98.0°F | Ht 70.0 in | Wt 128.0 lb

## 2019-12-22 DIAGNOSIS — Z91010 Allergy to peanuts: Secondary | ICD-10-CM | POA: Diagnosis not present

## 2019-12-22 DIAGNOSIS — G40909 Epilepsy, unspecified, not intractable, without status epilepticus: Secondary | ICD-10-CM

## 2019-12-22 DIAGNOSIS — G43909 Migraine, unspecified, not intractable, without status migrainosus: Secondary | ICD-10-CM | POA: Diagnosis not present

## 2019-12-22 DIAGNOSIS — J4520 Mild intermittent asthma, uncomplicated: Secondary | ICD-10-CM | POA: Diagnosis not present

## 2019-12-22 NOTE — Progress Notes (Signed)
Subjective:     Patient ID: Terrence Riley, male   DOB: 07-Mar-2001, 19 y.o.   MRN: [id number]  HPI Seen to establish care.  He has history of seizure disorder, mild intermittent asthma, seasonal allergies, and reported history of migraine headaches.  He states he has not had a migraine headache in several years.  He is on Keppra and Topamax as well as Klonopin and followed by [facility] neurology.  He states he is not had a seizure in several years.  No wheezing in at least a few years.  No prior surgeries.  Medications as above.  Lives with mom.  He is looking at trying to get into school at either [employer] this fall or possibly [employer].  No alcohol use.  Non-smoker.  Mother's had breast cancer.  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  His biologic father's history is unknown.  He has history of allergy to peanuts, dairy, and citrus.  Past Medical History:  Diagnosis Date  . Allergy    rhinitis  . Asthma    as a child  . Depression   . Eosinophilic esophagitis    heartburn and reflux  . Family history of adverse reaction to anesthesia    mother respiratory failure  . Nasal fracture    deviated septum  . Premature baby    2 months early  . Seizures ([place])    well controlled on meds/ one 2 months ago when meds were late   Past Surgical History:  Procedure Laterality Date  . ADENOIDECTOMY    . CLOSED REDUCTION NASAL FRACTURE N/A 08/31/2017   Procedure: CLOSED REDUCTION NASAL FRACTURE;  Surgeon: Clyde Canterbury, MD;  Location: [place];  Service: ENT;  Laterality: N/A;  . SEPTOPLASTY N/A 08/31/2017   Procedure: SEPTOPLASTY;  Surgeon: Clyde Canterbury, MD;  Location: [facility];  Service: ENT;  Laterality: N/A;  . TONSILLECTOMY     and addenoids    reports that he has never smoked. He has never used smokeless tobacco. He reports that he does not drink alcohol or use drugs. family history includes Cancer in his mother; Protein S deficiency in his  mother. Allergies  Allergen Reactions  . Lortab [Hydrocodone-Acetaminophen] Hives  . Other Anaphylaxis    Peanuts  . Zofran [Ondansetron Hcl] Nausea And Vomiting  . Citrus Other (See Comments)    Sores in mouth  . Dairy Aid [Lactase] Nausea And Vomiting  . Flu Virus Vaccine Other (See Comments)  . Soy Allergy Other (See Comments)    Seizures   . Tylenol [Acetaminophen]     Can cause a fever and seizure activity   . Eggs Or Egg-Derived Products Rash    Pt mother reports pt only avoids eggs alone but can tolerate as an ingredient. [facility] 07/27/13 Pt mother reports pt only avoids eggs alone but can tolerate as an ingredient. [facility] 07/27/13 Pt mother reports pt only avoids eggs alone but can tolerate as an ingredient. [facility] 07/27/13   . Hydrocodone-Acetaminophen Rash     Review of Systems  Constitutional: Negative for chills, fatigue, fever and unexpected weight change.  Eyes: Negative for visual disturbance.  Respiratory: Negative for cough, chest tightness and shortness of breath.   Cardiovascular: Negative for chest pain, palpitations and leg swelling.  Genitourinary: Negative for dysuria.  Neurological: Negative for dizziness, syncope, weakness, light-headedness and headaches.       Objective:   Physical Exam Vitals reviewed.  Constitutional:      Appearance: Normal appearance.  Cardiovascular:     Rate and Rhythm: Normal rate and regular rhythm.  Pulmonary:     Effort: Pulmonary effort is normal.     Breath sounds: Normal breath sounds.  Abdominal:     Palpations: Abdomen is soft.     Tenderness: There is no abdominal tenderness.  Musculoskeletal:     Cervical back: Neck supple.     Right lower leg: No edema.     Left lower leg: No edema.  Lymphadenopathy:     Cervical: No cervical adenopathy.  Neurological:     Mental Status: He is alert.        Assessment:     #1 history of seizure disorder.  Patient followed at [facility] neurology.  Maintained on Keppra and  Topamax with no recent seizures  #2+ family history of protein S deficiency.  He is not sure but thinks his mom may have had a DVT previously  #3 history of migraine headaches which are very infrequent  #4 history of mild intermittent asthma    Plan:     -Patient had questions regarding Covid vaccine and we tried to answer those as best we could -we asked he confirm he has epi-pen that is not out of date -continue albuterol prn for infrequent wheezing. -we spent 30 minutes reviewing his hx and education regarding his medical conditions.    Eulas Post MD [facility]

## 2019-12-31 ENCOUNTER — Emergency Department (HOSPITAL_COMMUNITY)
Admission: EM | Admit: 2019-12-31 | Discharge: 2019-12-31 | Disposition: A | Discharge status: Home / Self Care | Payer: Commercial Managed Care - PPO | Attending: Emergency Medicine | Admitting: Emergency Medicine

## 2019-12-31 ENCOUNTER — Encounter (HOSPITAL_COMMUNITY): Payer: Self-pay

## 2019-12-31 ENCOUNTER — Other Ambulatory Visit: Payer: Self-pay

## 2019-12-31 DIAGNOSIS — Y9367 Activity, basketball: Secondary | ICD-10-CM | POA: Diagnosis not present

## 2019-12-31 DIAGNOSIS — S0181XA Laceration without foreign body of other part of head, initial encounter: Secondary | ICD-10-CM

## 2019-12-31 DIAGNOSIS — J45909 Unspecified asthma, uncomplicated: Secondary | ICD-10-CM | POA: Diagnosis not present

## 2019-12-31 DIAGNOSIS — Z79899 Other long term (current) drug therapy: Secondary | ICD-10-CM | POA: Insufficient documentation

## 2019-12-31 DIAGNOSIS — W500XXA Accidental hit or strike by another person, initial encounter: Secondary | ICD-10-CM | POA: Diagnosis not present

## 2019-12-31 DIAGNOSIS — Y999 Unspecified external cause status: Secondary | ICD-10-CM | POA: Diagnosis not present

## 2019-12-31 DIAGNOSIS — Y929 Unspecified place or not applicable: Secondary | ICD-10-CM | POA: Insufficient documentation

## 2019-12-31 NOTE — ED Triage Notes (Signed)
Pt presents with a laceration above his L eye from playing basketball. Denies LOC or vision changes. States that it happened about an hour and half ago. Bleeding controlled.

## 2019-12-31 NOTE — ED Provider Notes (Signed)
[facility] DEPT Provider Note   CSN: [id number] Arrival date & time: 12/31/19  1910     History Chief Complaint  Patient presents with  . Laceration    Terrence Riley is a 19 y.o. male.  He is here for evaluation of a laceration to his forehead after being struck with an elbow playing basketball.  Occurred about an hour and a half ago.  No real pain.  No visual symptoms.  No loss of consciousness.  The history is provided by the patient.  Laceration Location:  Face Facial laceration location:  Forehead Length:  2 Depth:  Cutaneous Quality: straight   Bleeding: controlled   Time since incident:  90 minutes Laceration mechanism:  Blunt object Pain details:    Quality:  Dull   Severity:  No pain Foreign body present:  No foreign bodies Relieved by:  None tried Worsened by:  Nothing Ineffective treatments:  None tried Tetanus status:  Up to date Associated symptoms: no fever        Past Medical History:  Diagnosis Date  . Allergy    rhinitis  . Asthma    as a child  . Depression   . Eosinophilic esophagitis    heartburn and reflux  . Family history of adverse reaction to anesthesia    mother respiratory failure  . Nasal fracture    deviated septum  . Premature baby    2 months early  . Seizures ([place])    well controlled on meds/ one 2 months ago when meds were late    Patient Active Problem List   Diagnosis Date Noted  . Seizure disorder ([place]) 12/22/2019  . Migraines 12/22/2019  . Mild intermittent asthma 12/22/2019    Past Surgical History:  Procedure Laterality Date  . ADENOIDECTOMY    . CLOSED REDUCTION NASAL FRACTURE N/A 08/31/2017   Procedure: CLOSED REDUCTION NASAL FRACTURE;  Surgeon: Clyde Canterbury, MD;  Location: [facility];  Service: ENT;  Laterality: N/A;  . SEPTOPLASTY N/A 08/31/2017   Procedure: SEPTOPLASTY;  Surgeon: Clyde Canterbury, MD;  Location: [facility];  Service: ENT;  Laterality:  N/A;  . TONSILLECTOMY     and addenoids       Family History  Problem Relation Age of Onset  . Cancer Mother        breat  . Protein S deficiency Mother     Social History   Tobacco Use  . Smoking status: Never Smoker  . Smokeless tobacco: Never Used  Substance Use Topics  . Alcohol use: No  . Drug use: No    Home Medications Prior to Admission medications   Medication Sig Start Date End Date Taking? Authorizing Provider  albuterol (PROVENTIL HFA;VENTOLIN HFA) 108 (90 Base) MCG/ACT inhaler Inhale 2 puffs into the lungs every 4 (four) hours as needed for wheezing.  11/08/17   [provider]  albuterol (PROVENTIL) (2.5 MG/3ML) 0.083% nebulizer solution Take 3 mLs (2.5 mg total) by nebulization every 4 (four) hours as needed for wheezing or shortness of breath. Patient not taking: Reported on 12/31/3014 08/04/30   Delora Fuel, MD  cetirizine (ZYRTEC) 10 MG tablet Take 10 mg by mouth daily as needed for allergies.     [provider]  Cholecalciferol (VITAMIN D-1000 MAX ST) 25 MCG (1000 UT) tablet Take 1,000 Units by mouth daily.  06/13/18   [provider]  clonazePAM (KLONOPIN) 0.5 MG tablet Take 0.5 mg by mouth daily. bedtime  [provider]  EPINEPHrine 0.3 mg/0.3 mL IJ SOAJ injection Inject 0.3 mg into the muscle once as needed for anaphylaxis. Allergic reaction    [provider]  levETIRAcetam (KEPPRA) 1000 MG tablet Take 1,000 mg by mouth 2 (two) times daily. Am and pm    [provider]  midazolam (VERSED) 5 MG/ML injection Place 2 mLs into the nose every 5 (five) minutes as needed (seizures). Draw up prescribed dose (ml) in syringe, remove blue vial access device, then attach syringe to nasal atomizer for intranasal administration.     [provider]  PRESCRIPTION MEDICATION Apply 1 application topically 3 (three) times daily as needed. Nausea - Phenergan Cream-    [provider]  topiramate  (TOPAMAX) 200 MG tablet Take 200 mg by mouth 2 (two) times daily.     [provider]    Allergies    Lortab [hydrocodone-acetaminophen], Other, Zofran [ondansetron hcl], Citrus, Dairy aid [lactase], Flu virus vaccine, Soy allergy, Tylenol [acetaminophen], Eggs or egg-derived products, and Hydrocodone-acetaminophen  Review of Systems   Review of Systems  Constitutional: Negative for fever.  HENT: Positive for facial swelling.   Eyes: Negative for visual disturbance.  Skin: Positive for wound.  Neurological: Negative for headaches.    Physical Exam Updated Vital Signs BP 126/74 (BP Location: Left Arm)   Pulse 67   Temp 98.3 F (36.8 C) (Oral)   Resp 16   SpO2 100%   Physical Exam Vitals and nursing note reviewed.  Constitutional:      Appearance: He is well-developed.  HENT:     Head: Normocephalic.     Comments:  2 cm laceration above left eyebrow. Eyes:     Conjunctiva/sclera: Conjunctivae normal.  Pulmonary:     Effort: Pulmonary effort is normal.  Musculoskeletal:     Cervical back: Neck supple.  Skin:    General: Skin is warm and dry.  Neurological:     General: No focal deficit present.     Mental Status: He is alert.     GCS: GCS eye subscore is 4. GCS verbal subscore is 5. GCS motor subscore is 6.     ED Results / Procedures / Treatments   Labs (all labs ordered are listed, but only abnormal results are displayed) Labs Reviewed - No data to display  EKG None  Radiology No results found.  Procedures .Marland KitchenLaceration Repair  Date/Time: 12/31/2019 8:04 PM Performed by: [name], MD Authorized by: [name], MD   Consent:    Consent obtained:  Verbal   Consent given by:  Patient   Risks discussed:  Infection, pain, poor cosmetic result, poor wound healing and retained foreign body   Alternatives discussed:  No treatment and delayed treatment Anesthesia (see MAR for exact dosages):    Anesthesia method:  None Laceration  details:    Location:  Face   Face location:  Forehead   Length (cm):  2 Repair type:    Repair type:  Simple Skin repair:    Repair method:  Tissue adhesive Approximation:    Approximation:  Close Post-procedure details:    Dressing:  Open (no dressing)   Patient tolerance of procedure:  Tolerated well, no immediate complications   (including critical care time)  Medications Ordered in ED Medications - No data to display  ED Course  I have reviewed the triage vital signs and the nursing notes.  Pertinent labs & imaging results that were available during my care of the  patient were reviewed by me and considered in my medical decision making (see chart for details).    MDM Rules/Calculators/A&P                       Final Clinical Impression(s) / ED Diagnoses Final diagnoses:  Laceration of forehead, initial encounter    Rx / DC Orders ED Discharge Orders    None       [name], MD 01/01/20 1103

## 2020-01-12 ENCOUNTER — Ambulatory Visit
Admission: RE | Admit: 2020-01-12 | Discharge: 2020-01-12 | Disposition: A | Discharge status: Home / Self Care | Payer: Commercial Managed Care - PPO | Source: Ambulatory Visit | Attending: Physician Assistant | Admitting: Physician Assistant

## 2020-01-12 ENCOUNTER — Other Ambulatory Visit: Payer: Self-pay

## 2020-01-12 DIAGNOSIS — S43005D Unspecified dislocation of left shoulder joint, subsequent encounter: Secondary | ICD-10-CM

## 2020-01-12 IMAGING — XA DG FLUORO GUIDE NDL PLC/BX
1 series · 1 of 1 positions shown · non-contrast
Comparison: none

CLINICAL DATA: Basketball injury. Pain with motion. Question labral
tear.

[Series 1: ortho adipose · 1 of 1 slices shown]
[im 1/1]
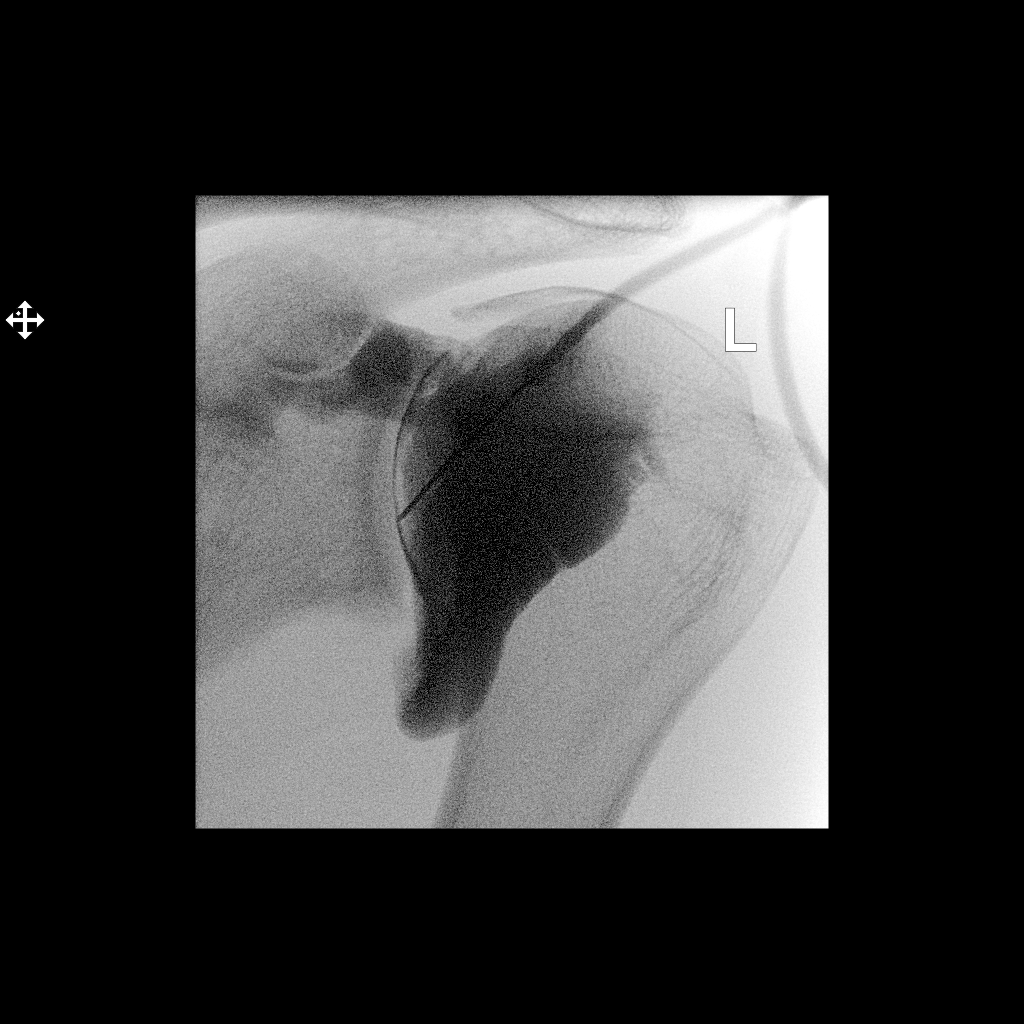

[1 of 1 positions shown; findings below may reference images not displayed]

FLUOROSCOPY TIME:  0 minutes 14 seconds. 1.74 micro gray meter
squared

PROCEDURE:
Left SHOULDER INJECTION UNDER FLUOROSCOPY

The skin overlying the shoulder was scrubbed with Betadine and
draped in sterile fashion. Skin and subcutaneous anesthesia was
carried out using a 25 gauge needle and 1% lidocaine. A 22 gauge
spinal needle was directed under fluoroscopic guidance on one pass
into the glenohumeral joint. 20 cc of a mixture of 0.1 cc MultiHance
and dilute Isovue 200 was then used to fill the glenohumeral joint.
IMPRESSION: Technically successful left shoulder injection for MRI.

## 2020-01-12 IMAGING — MR MR SHOULDER*L* W/ CM
5 series · 40 of 40 positions shown · IV contrast (agent unspecified)
Comparison: Radiographs dated [DATE]

CLINICAL DATA: Left shoulder pain. Frequent shoulder dislocations
over the last 3 months. History of seizure.

EXAM:
MR ARTHROGRAM OF THE LEFT SHOULDER
TECHNIQUE: Multiplanar, multisequence MR imaging of the left shoulder was
performed following the administration of intra-articular contrast.
CONTRAST:  See injection procedure dictation.

[Series 3: T1 fat-sat · axial · 4.0mm · 0.27mm/px · z∈[-65,+30]mm · 10 of 21 slices shown (1 of 3)]
[im 1/21]
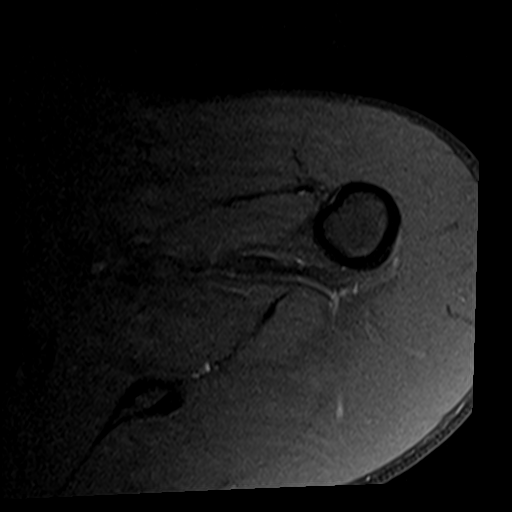
[im 3/21]
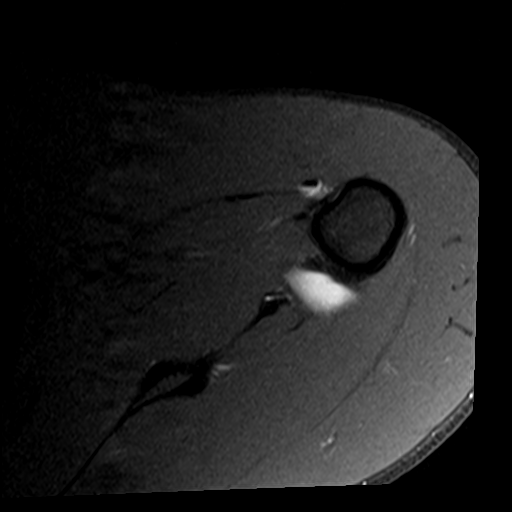
[im 5/21]
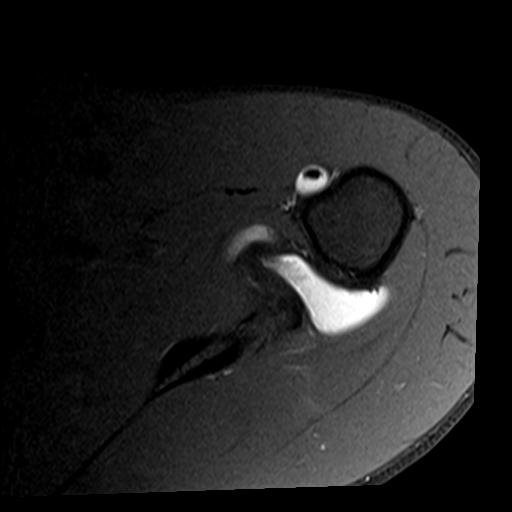
[im 7/21]
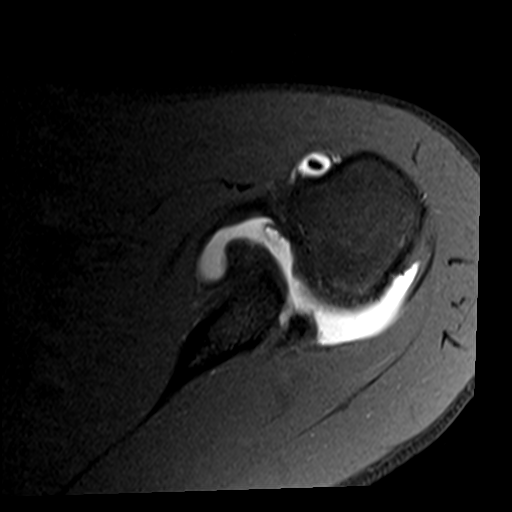
[im 9/21]
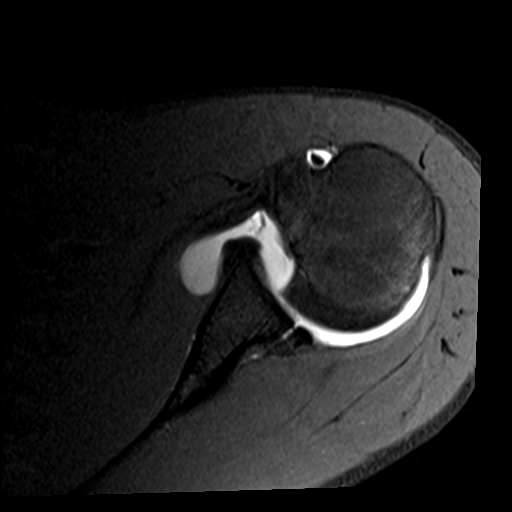
[im 12/21]
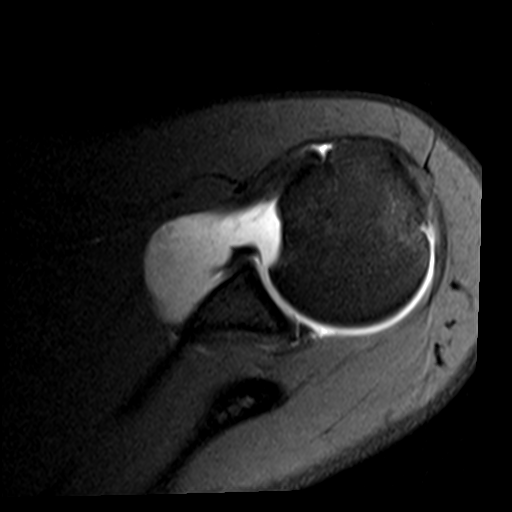
[im 14/21]
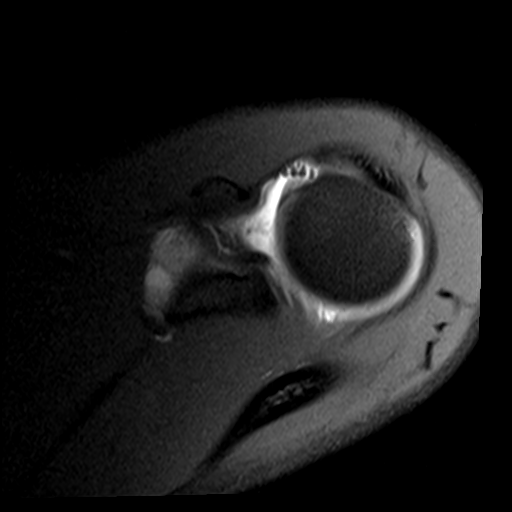
[im 16/21]
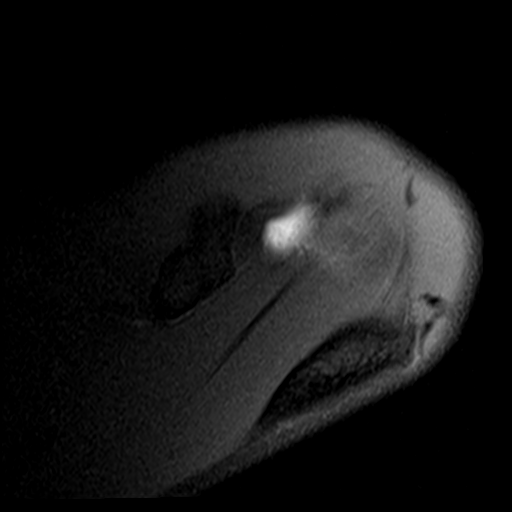
[im 18/21]
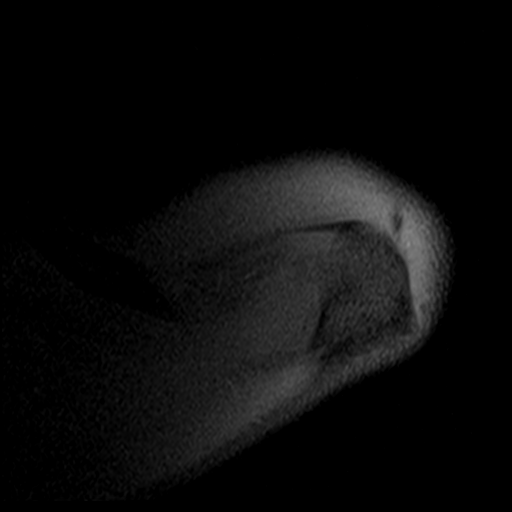
[im 21/21]
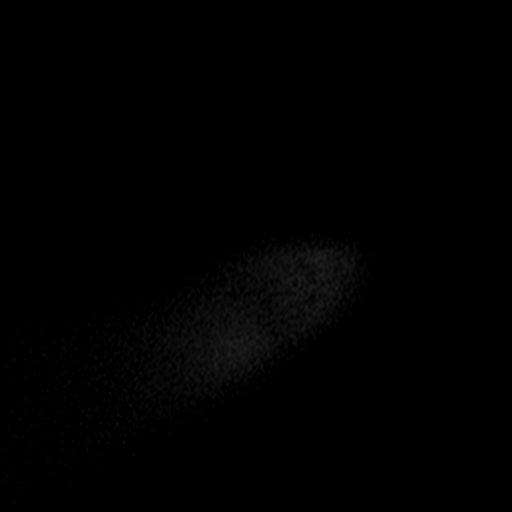

[Series 4: T2 fat-sat · oblique · 4.0mm · 0.55mm/px · 9 of 21 slices shown (1 of 2)]
[im 1/21]
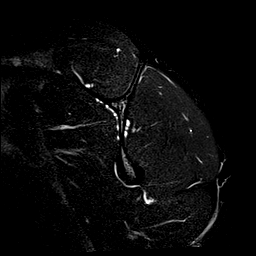
[im 3/21]
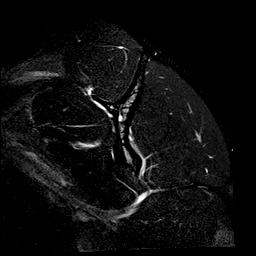
[im 6/21]
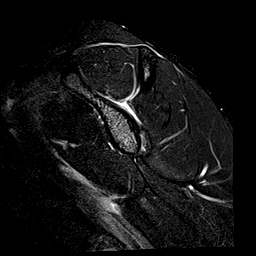
[im 8/21]
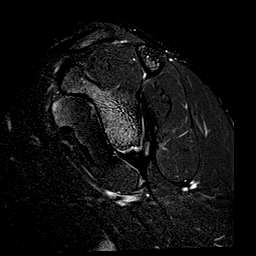
[im 11/21]
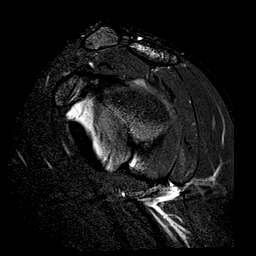
[im 13/21]
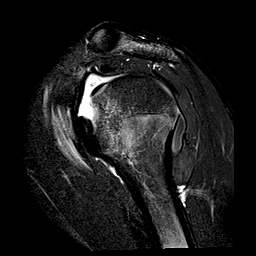
[im 16/21]
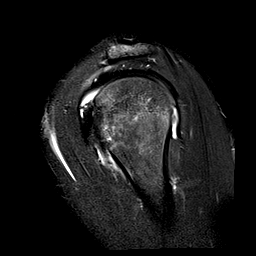
[im 18/21]
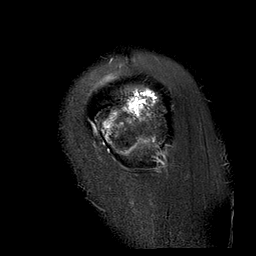
[im 21/21]
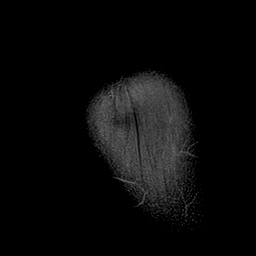

[Series 5: T1 fat-sat · oblique · 4.0mm · 0.55mm/px · 7 of 16 slices shown (2 of 3)]
[im 1/16]
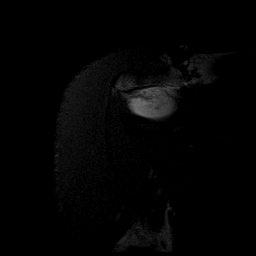
[im 3/16]
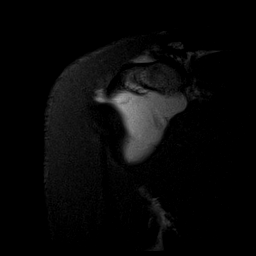
[im 6/16]
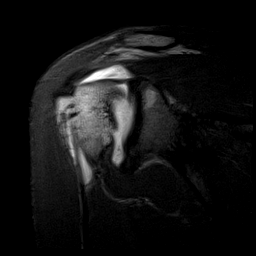
[im 8/16]
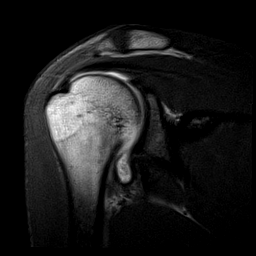
[im 11/16]
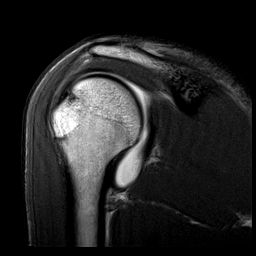
[im 13/16]
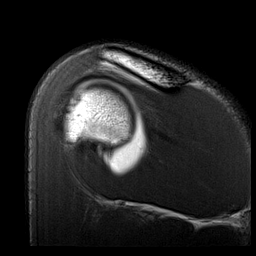
[im 16/16]
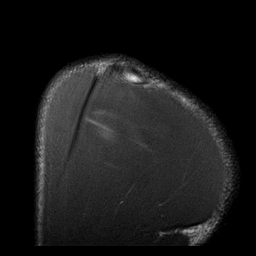

[Series 6: T1 fat-sat · oblique · 4.0mm · 0.55mm/px · 7 of 16 slices shown (3 of 3)]
[im 1/16]
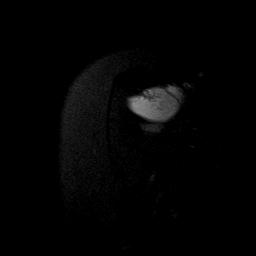
[im 3/16]
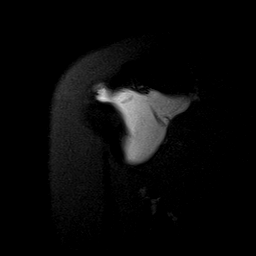
[im 6/16]
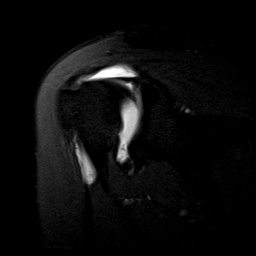
[im 8/16]
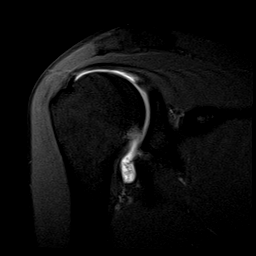
[im 11/16]
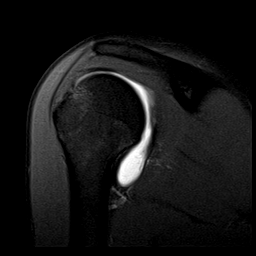
[im 13/16]
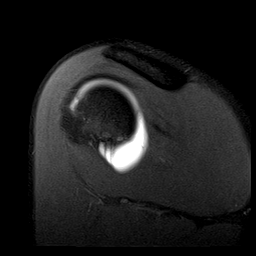
[im 16/16]
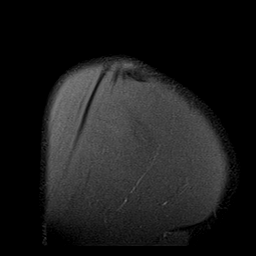

[Series 7: T2 fat-sat · oblique · 4.0mm · 0.55mm/px · 7 of 16 slices shown (2 of 2)]
[im 1/16]
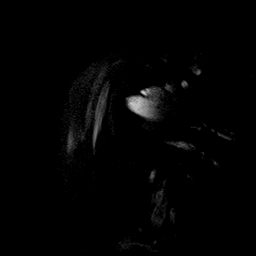
[im 3/16]
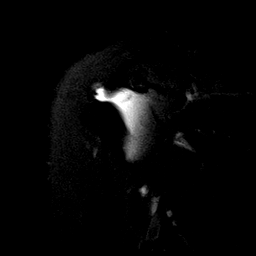
[im 6/16]
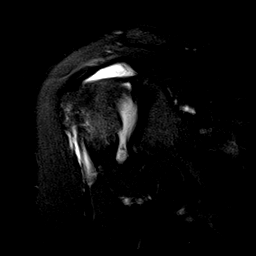
[im 8/16]
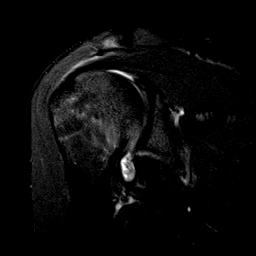
[im 11/16]
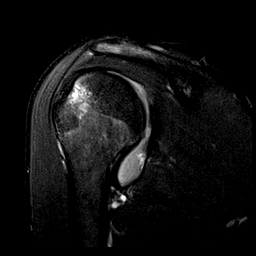
[im 13/16]
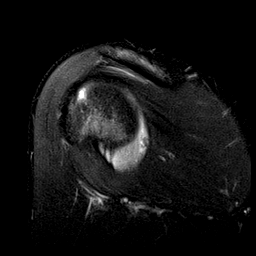
[im 16/16]
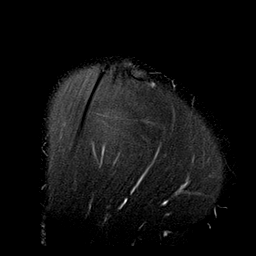

[40 of 40 positions shown; findings below may reference images not displayed]

FINDINGS: Rotator cuff: Intact.

Muscles: No atrophy or edema.

Biceps long head: Properly located and intact.

Acromioclavicular Joint: Normal.  Type 2 acromion.  No bursitis.

Glenohumeral Joint: Prominent reverse Hill-Sachs impaction fracture
of the anteromedial aspect of the humeral head.

Labrum: There is extensive posterior labral tear that extends from
the 6 o'clock position to the 12 o'clock position. There is a
reverse bony Bankart lesion in the 5 o'clock position.

Bones: Reverse Hill-Sachs lesion. Reverse bony Bankart lesion.
Nonspecific edema in the posterior aspect of the greater tuberosity.
IMPRESSION: 1. Prominent reverse Hill-Sachs impaction fracture of the
anteromedial aspect of the humeral head.
2. Extensive posterior labral tear from the 6 o'clock position to
the 12 o'clock position.
3. Reverse bony Bankart lesion in the 5 o'clock position.

## 2020-01-12 MED ORDER — IOPAMIDOL (ISOVUE-M 200) INJECTION 41%
13.0000 mL | Freq: Once | INTRAMUSCULAR | Status: AC
  Administered 2020-01-12: 13 mL via INTRA_ARTICULAR

## 2020-01-31 ENCOUNTER — Ambulatory Visit: Payer: Commercial Managed Care - PPO | Admitting: Orthopaedic Surgery

## 2020-02-13 ENCOUNTER — Ambulatory Visit (INDEPENDENT_AMBULATORY_CARE_PROVIDER_SITE_OTHER): Payer: Commercial Managed Care - PPO | Admitting: Orthopaedic Surgery

## 2020-02-13 ENCOUNTER — Other Ambulatory Visit: Payer: Self-pay

## 2020-02-13 ENCOUNTER — Encounter: Payer: Self-pay | Admitting: Orthopaedic Surgery

## 2020-02-13 VITALS — Ht 70.0 in | Wt 128.0 lb

## 2020-02-13 DIAGNOSIS — S43005D Unspecified dislocation of left shoulder joint, subsequent encounter: Secondary | ICD-10-CM | POA: Diagnosis not present

## 2020-02-13 DIAGNOSIS — M25512 Pain in left shoulder: Secondary | ICD-10-CM | POA: Diagnosis not present

## 2020-02-13 DIAGNOSIS — G8929 Other chronic pain: Secondary | ICD-10-CM | POA: Diagnosis not present

## 2020-02-13 DIAGNOSIS — M24111 Other articular cartilage disorders, right shoulder: Secondary | ICD-10-CM | POA: Insufficient documentation

## 2020-02-13 DIAGNOSIS — S43432D Superior glenoid labrum lesion of left shoulder, subsequent encounter: Secondary | ICD-10-CM

## 2020-02-13 NOTE — Progress Notes (Signed)
The patient is seen for follow-up after having an MRI arthrogram of his left shoulder. He was sent to [place] from the emergency room. He is only 19 years old very thin individual. He has a history of at least 15 dislocations of that left shoulder per his account. He does have a history of seizure disorder. We sent him for this MRI to better evaluate the labrum and the shoulder in general. Currently he says his shoulder is feeling okay and its in place and he tries to modify his activities to keep it from sliding out.  On exam the shoulder is lax on the left side. It is clinically well located.  The MRI of his left shoulder does show a large posterior labral tear with a reverse Bankart lesion and a reverse Hill-Sachs lesion.  I explained to him the complicated nature of the shoulder. I would like to send him to my partner Dr. Alphonzo Severance for further ration and treatment of this shoulder due to the complex labral tear that he has. The patient is agreeable to this referral. All question concerns were answered and addressed

## 2020-02-21 ENCOUNTER — Ambulatory Visit (INDEPENDENT_AMBULATORY_CARE_PROVIDER_SITE_OTHER): Payer: Commercial Managed Care - PPO | Admitting: Orthopedic Surgery

## 2020-02-21 ENCOUNTER — Other Ambulatory Visit: Payer: Self-pay

## 2020-02-21 DIAGNOSIS — M25512 Pain in left shoulder: Secondary | ICD-10-CM

## 2020-02-21 DIAGNOSIS — G8929 Other chronic pain: Secondary | ICD-10-CM | POA: Diagnosis not present

## 2020-02-21 DIAGNOSIS — S43005D Unspecified dislocation of left shoulder joint, subsequent encounter: Secondary | ICD-10-CM

## 2020-02-22 ENCOUNTER — Encounter: Payer: Self-pay | Admitting: Orthopedic Surgery

## 2020-02-22 NOTE — Progress Notes (Signed)
Office Visit Note   Patient: Terrence Riley           Date of Birth: 2001/01/15           MRN: [id number] Visit Date: 02/21/2020 Requested by: Langley Gauss, MD [address],  [place] [id number] PCP: Langley Gauss, MD  Subjective: Chief Complaint  Patient presents with  . discuss surgery    HPI: Patient presents for evaluation of left shoulder.  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  He is right-hand dominant.  The dislocation episodes are becoming more frequent.  He has never had a particularly violent dislocation episode.  He has had an MRI scan which is reviewed which shows reverse Hill-Sachs lesion as well as bony Bankart posteriorly.  There is also posterior labral tear from 6-12 o'clock.  He is planning to start school in the near future.              ROS: All systems reviewed are negative as they relate to the chief complaint within the history of present illness.  Patient denies  fevers or chills.   Assessment & Plan: Visit Diagnoses:  1. Shoulder dislocation, left, subsequent encounter   2. Chronic left shoulder pain     Plan: Impression is left shoulder posterior instability with engaging reverse Hill-Sachs lesion and reverse bony Bankart lesion present.  This is a difficult situation.  Plan at this time is for open humeral head allograft placement for reverse Hill-Sachs lesion with headless screw fixation.  Patient will also need to have his posterior reverse bony Bankart lesion addressed.  We will do the initial bone grafting through open approach in the front.  Regarding the back we may be able to achieve screw fixation front to back with subsequent labral repair done either open or arthroscopically.  Risk and benefits of surgery are discussed include not limited to shoulder stiffness loss of motion potential nerve vessel  damage.  Need for NCAT CT scan with reconstruction to really get a better handle on the bone loss as well as the exact dimensions of the glenoid in case we do screws back to front as opposed to front to back.  Follow-Up Instructions: No follow-ups on file.   Orders:  Orders Placed This Encounter  Procedures  . CT SHOULDER LEFT WO CONTRAST   No orders of the defined types were placed in this encounter.     Procedures: No procedures performed   Clinical Data: No additional findings.  Objective: Vital Signs: There were no vitals taken for this visit.  Physical Exam:   Constitutional: Patient appears well-developed HEENT:  Head: Normocephalic Eyes:EOM are normal Neck: Normal range of motion Cardiovascular: Normal rate Pulmonary/chest: Effort normal Neurologic: Patient is alert Skin: Skin is warm Psychiatric: Patient has normal mood and affect    Ortho Exam: Ortho exam demonstrates functional deltoid with no scapular dyskinesia on the left-hand side.  Motor sensory function hand is intact.  Rotator cuff strength good infraspinatus extremity subscap muscle testing.  Does have more posterior instability than anterior.  Less than a centimeter sulcus sign.  Specialty Comments:  No specialty comments available.  Imaging: No results found.   PMFS History: Patient Active Problem List   Diagnosis Date Noted  . Labral tear of shoulder, left, subsequent encounter 02/13/2020  . Shoulder dislocation, left, subsequent encounter 02/13/2020  . Seizure disorder (  [place]) 12/22/2019  . Migraines 12/22/2019  . Mild intermittent asthma 12/22/2019   Past Medical History:  Diagnosis Date  . Allergy    rhinitis  . Asthma    as a child  . Depression   . Eosinophilic esophagitis    heartburn and reflux  . Family history of adverse reaction to anesthesia    mother respiratory failure  . Nasal fracture    deviated septum  . Premature baby    2 months early  . Seizures ([place])     well controlled on meds/ one 2 months ago when meds were late    Family History  Problem Relation Age of Onset  . Cancer Mother        breat  . Protein S deficiency Mother     Past Surgical History:  Procedure Laterality Date  . ADENOIDECTOMY    . CLOSED REDUCTION NASAL FRACTURE N/A 08/31/2017   Procedure: CLOSED REDUCTION NASAL FRACTURE;  Surgeon: Clyde Canterbury, MD;  Location: [facility];  Service: ENT;  Laterality: N/A;  . SEPTOPLASTY N/A 08/31/2017   Procedure: SEPTOPLASTY;  Surgeon: Clyde Canterbury, MD;  Location: [facility];  Service: ENT;  Laterality: N/A;  . TONSILLECTOMY     and addenoids   Social History   Occupational History  . Not on file  Tobacco Use  . Smoking status: Never Smoker  . Smokeless tobacco: Never Used  Vaping Use  . Vaping Use: Never used  Substance and Sexual Activity  . Alcohol use: No  . Drug use: No  . Sexual activity: Not on file

## 2020-02-25 ENCOUNTER — Other Ambulatory Visit: Payer: Self-pay

## 2020-02-25 ENCOUNTER — Emergency Department (HOSPITAL_COMMUNITY)
Admission: EM | Admit: 2020-02-25 | Discharge: 2020-02-25 | Disposition: A | Discharge status: Home / Self Care | Payer: Commercial Managed Care - PPO | Attending: Emergency Medicine | Admitting: Emergency Medicine

## 2020-02-25 ENCOUNTER — Encounter (HOSPITAL_COMMUNITY): Payer: Self-pay | Admitting: Emergency Medicine

## 2020-02-25 DIAGNOSIS — M545 Low back pain, unspecified: Secondary | ICD-10-CM

## 2020-02-25 DIAGNOSIS — M62838 Other muscle spasm: Secondary | ICD-10-CM | POA: Diagnosis not present

## 2020-02-25 DIAGNOSIS — J4520 Mild intermittent asthma, uncomplicated: Secondary | ICD-10-CM | POA: Diagnosis not present

## 2020-02-25 DIAGNOSIS — E876 Hypokalemia: Secondary | ICD-10-CM

## 2020-02-25 DIAGNOSIS — Z79899 Other long term (current) drug therapy: Secondary | ICD-10-CM | POA: Diagnosis not present

## 2020-02-25 LAB — CBC
HCT: 38.4 % — ABNORMAL LOW (ref 39.0–52.0)
Hemoglobin: 13.3 g/dL (ref 13.0–17.0)
MCH: 28.9 pg (ref 26.0–34.0)
MCHC: 34.6 g/dL (ref 30.0–36.0)
MCV: 83.3 fL (ref 80.0–100.0)
Platelets: 155 10*3/uL (ref 150–400)
RBC: 4.61 MIL/uL (ref 4.22–5.81)
RDW: 12.3 % (ref 11.5–15.5)
WBC: 4 10*3/uL (ref 4.0–10.5)
nRBC: 0 % (ref 0.0–0.2)

## 2020-02-25 LAB — URINALYSIS, ROUTINE W REFLEX MICROSCOPIC
Bilirubin Urine: NEGATIVE
Glucose, UA: NEGATIVE mg/dL
Hgb urine dipstick: NEGATIVE
Ketones, ur: NEGATIVE mg/dL
Leukocytes,Ua: NEGATIVE
Nitrite: NEGATIVE
Protein, ur: NEGATIVE mg/dL
Specific Gravity, Urine: 1.009 (ref 1.005–1.030)
pH: 8 (ref 5.0–8.0)

## 2020-02-25 LAB — BASIC METABOLIC PANEL
Anion gap: 9 (ref 5–15)
BUN: 12 mg/dL (ref 6–20)
CO2: 22 mmol/L (ref 22–32)
Calcium: 9 mg/dL (ref 8.9–10.3)
Chloride: 107 mmol/L (ref 98–111)
Creatinine, Ser: 1.01 mg/dL (ref 0.61–1.24)
GFR calc Af Amer: >60 mL/min (ref >60)
GFR calc non Af Amer: >60 mL/min (ref >60)
Glucose, Bld: 101 mg/dL — ABNORMAL HIGH (ref 70–99)
Potassium: 3.1 mmol/L — ABNORMAL LOW (ref 3.5–5.1)
Sodium: 138 mmol/L (ref 135–145)

## 2020-02-25 MED ORDER — LIDOCAINE 5 % EX PTCH
1.0000 | MEDICATED_PATCH | CUTANEOUS | Status: DC
  Administered 2020-02-25: 1 via TRANSDERMAL
  Filled 2020-02-25: qty 1

## 2020-02-25 MED ORDER — POTASSIUM CHLORIDE CRYS ER 20 MEQ PO TBCR
40.0000 meq | EXTENDED_RELEASE_TABLET | Freq: Once | ORAL | Status: AC
  Administered 2020-02-25: 40 meq via ORAL

## 2020-02-25 MED ORDER — LIDOCAINE 5 % EX PTCH: 1.0000 | MEDICATED_PATCH | CUTANEOUS | 0 refills | Status: DC

## 2020-02-25 MED ORDER — NAPROXEN 500 MG PO TABS
500.0000 mg | ORAL_TABLET | Freq: Two times a day (BID) | ORAL | 0 refills | Status: DC
Start: 2020-02-25 — End: 2020-09-11

## 2020-02-25 MED ORDER — IBUPROFEN 800 MG PO TABS
800.0000 mg | ORAL_TABLET | Freq: Once | ORAL | Status: AC
  Administered 2020-02-25: 800 mg via ORAL
  Filled 2020-02-25: qty 1

## 2020-02-25 MED ORDER — METHOCARBAMOL 500 MG PO TABS
500.0000 mg | ORAL_TABLET | Freq: Two times a day (BID) | ORAL | 0 refills | Status: DC
Start: 2020-02-25 — End: 2020-09-11

## 2020-02-25 NOTE — ED Provider Notes (Signed)
[facility] DEPT Provider Note   CSN: [id number] Arrival date & time: 02/25/20  0247     History Chief Complaint  Patient presents with  . Neck Pain  . Back Pain    Terrence Riley is a 19 y.o. male with past medical history significant for recurrent shoulder dislocations, seizures who presents for evaluation of back and neck pain.  Patient states he has recurrent lower back pain x1 year.  Normally occurs after he plays basketball.  Typically self resolves after the last 2 days.  Patient feels like his muscles will "follow-up" and then relax.  He has not taken anything for his symptoms.  No recent trauma, MVC's or injuries.  He does not have any pain that extends into his legs, no history of IV drug use, bowel or bladder incontinence, saddle paresthesia or malignancy.  No numbness or tingling, unilateral leg swelling, redness, warmth, midline back pain, fevers, chills.  Rates his pain an 8/10.  Worse with movement.  Resolved when he lays flat.  Patient also with left-sided trapezius neck pain.  Patient states his left shoulder dislocates with little movement.  He feels like he has to continually walk with his left shoulder up to make sure this does not dislocate.  His last dislocation was less than 1 week ago.  Patient feels like he has spasms to his posterior left shoulder and into his upper back and neck which he feels is worse after his shoulder dislocations.  He has no midline neck pain.  He has full range of motion without difficulty.  No headache, lightness, dizziness, sore throat, congestion, rhinorrhea, chest pain, shortness of breath, hemoptysis, abdominal pain.  No urinary complaints or diarrhea.  He is not take anything for his neck pain.  This has been occurring x3 months.  He is followed by Dr. Marlou Sa and Dr. Ninfa Linden with orthopedics.  States he supposed to have shoulder surgery as his last MRI "showed on my tendons and ligaments were gone."  States he  had tried using a sling for his left shoulder which helps his neck pain is is relaxed it however states "I cannot do anything when I have the sling on."  Denies additional aggravating or relieving factors. Ambulatory without difficulty. Patient states he did recently move yesterday and had been picking up multiple boxes. Lower back pain to bilateral paraspinal muscles, no midline pain.  History obtained from patient and past medical history. No interpretor was used.  HPI     Past Medical History:  Diagnosis Date  . Allergy    rhinitis  . Asthma    as a child  . Depression   . Eosinophilic esophagitis    heartburn and reflux  . Family history of adverse reaction to anesthesia    mother respiratory failure  . Nasal fracture    deviated septum  . Premature baby    2 months early  . Seizures ([place])    well controlled on meds/ one 2 months ago when meds were late    Patient Active Problem List   Diagnosis Date Noted  . Labral tear of shoulder, left, subsequent encounter 02/13/2020  . Shoulder dislocation, left, subsequent encounter 02/13/2020  . Seizure disorder ([place]) 12/22/2019  . Migraines 12/22/2019  . Mild intermittent asthma 12/22/2019    Past Surgical History:  Procedure Laterality Date  . ADENOIDECTOMY    . CLOSED REDUCTION NASAL FRACTURE N/A 08/31/2017   Procedure: CLOSED REDUCTION NASAL FRACTURE;  Surgeon: Clyde Canterbury, MD;  Location: [facility];  Service: ENT;  Laterality: N/A;  . SEPTOPLASTY N/A 08/31/2017   Procedure: SEPTOPLASTY;  Surgeon: Clyde Canterbury, MD;  Location: [facility];  Service: ENT;  Laterality: N/A;  . TONSILLECTOMY     and addenoids       Family History  Problem Relation Age of Onset  . Cancer Mother        breat  . Protein S deficiency Mother     Social History   Tobacco Use  . Smoking status: Never Smoker  . Smokeless tobacco: Never Used  Vaping Use  . Vaping Use: Never used  Substance Use Topics  . Alcohol use: No   . Drug use: No    Home Medications Prior to Admission medications   Medication Sig Start Date End Date Taking? Authorizing Provider  albuterol (PROVENTIL HFA;VENTOLIN HFA) 108 (90 Base) MCG/ACT inhaler Inhale 2 puffs into the lungs every 4 (four) hours as needed for wheezing.  11/08/17   [provider]  albuterol (PROVENTIL) (2.5 MG/3ML) 0.083% nebulizer solution Take 3 mLs (2.5 mg total) by nebulization every 4 (four) hours as needed for wheezing or shortness of breath. 16/96/78   Delora Fuel, MD  cetirizine (ZYRTEC) 10 MG tablet Take 10 mg by mouth daily as needed for allergies.     [provider]  Cholecalciferol (VITAMIN D-1000 MAX ST) 25 MCG (1000 UT) tablet Take 1,000 Units by mouth daily.  06/13/18   [provider]  clonazePAM (KLONOPIN) 0.5 MG tablet Take 0.5 mg by mouth daily. bedtime    [provider]  EPINEPHrine 0.3 mg/0.3 mL IJ SOAJ injection Inject 0.3 mg into the muscle once as needed for anaphylaxis. Allergic reaction    [provider]  levETIRAcetam (KEPPRA) 1000 MG tablet Take 1,000 mg by mouth 2 (two) times daily. Am and pm    [provider]  lidocaine (LIDODERM) 5 % Place 1 patch onto the skin daily. Remove & Discard patch within 12 hours or as directed by MD 02/25/20   Jericho Cieslik A, PA-C  methocarbamol (ROBAXIN) 500 MG tablet Take 1 tablet (500 mg total) by mouth 2 (two) times daily. 02/25/20   Taelar Gronewold A, PA-C  midazolam (VERSED) 5 MG/ML injection Place 2 mLs into the nose every 5 (five) minutes as needed (seizures). Draw up prescribed dose (ml) in syringe, remove blue vial access device, then attach syringe to nasal atomizer for intranasal administration.     [provider]  naproxen (NAPROSYN) 500 MG tablet Take 1 tablet (500 mg total) by mouth 2 (two) times daily. 02/25/20   Demarea Lorey A, PA-C  PRESCRIPTION MEDICATION Apply 1 application topically 3 (three) times daily as needed. Nausea -  Phenergan Cream-    [provider]  topiramate (TOPAMAX) 200 MG tablet Take 200 mg by mouth 2 (two) times daily.     [provider]    Allergies    Lortab [hydrocodone-acetaminophen], Other, Zofran [ondansetron hcl], Citrus, Dairy aid [lactase], Flu virus vaccine, Soy allergy, Tylenol [acetaminophen], Eggs or egg-derived products, and Hydrocodone-acetaminophen  Review of Systems   Review of Systems  Constitutional: Negative.   HENT: Negative.   Respiratory: Negative.   Cardiovascular: Negative.   Gastrointestinal: Negative.   Genitourinary: Negative.   Musculoskeletal: Positive for back pain and neck pain. Negative for arthralgias, gait problem, joint swelling, myalgias and neck stiffness.  Skin: Negative.   Neurological: Negative.   All other systems reviewed and are negative.   Physical  Exam Updated Vital Signs BP (!) 137/70 (BP Location: Left Arm)   Pulse 66   Temp 99.3 F (37.4 C) (Oral)   Resp 15   Ht 6' (1.829 m)   Wt 61.2 kg   SpO2 100%   BMI 18.31 kg/m   Physical Exam Neck:   Musculoskeletal:       Back:    Physical Exam  Constitutional: Pt appears well-developed and well-nourished. No distress.  HENT:  Head: Normocephalic and atraumatic.  Mouth/Throat: Oropharynx is clear and moist. No oropharyngeal exudate.  Eyes: Conjunctivae are normal.  Neck: Normal range of motion. Neck supple.  Tenderness with palpable spasm to left trapezius. Full ROM without pain  Cardiovascular: Normal rate, regular rhythm and intact distal pulses.   Pulmonary/Chest: Effort normal and breath sounds normal. No respiratory distress. Pt has no wheezes.  Abdominal: Soft. Pt exhibits no distension. There is no tenderness, rebound or guarding. No abd bruit or pulsatile mass Musculoskeletal:  Full range of motion of the T-spine and L-spine with flexion, hyperextension, and lateral flexion. No midline tenderness or stepoffs. No tenderness to palpation of the spinous  processes of the T-spine or L-spine. Mild tenderness to palpation of the paraspinous muscles of the L-spine bilaterally. No midline spinal tenderness. Negative straight leg raise. Full range of motion to bilateral shoulders however tenderness extending from his posterior left shoulder into his trapezius.  He has negative Hawkins, empty can.  No squared off shoulder.  Low suspicion for dislocation Lymphadenopathy:    Pt has no cervical adenopathy.  Neurological: Pt is alert. Pt has normal reflexes.  Reflex Scores:      Bicep reflexes are 2+ on the right side and 2+ on the left side.      Brachioradialis reflexes are 2+ on the right side and 2+ on the left side.      Patellar reflexes are 2+ on the right side and 2+ on the left side.      Achilles reflexes are 2+ on the right side and 2+ on the left side. Speech is clear and goal oriented, follows commands Normal 5/5 strength in upper and lower extremities bilaterally including dorsiflexion and plantar flexion, strong and equal grip strength Sensation normal to light and sharp touch Moves extremities without ataxia, coordination intact Normal gait Normal balance No Clonus Skin: Skin is warm and dry. No rash noted or lesions noted. Pt is not diaphoretic. No erythema, ecchymosis,edema or warmth.  Psychiatric: Pt has a normal mood and affect. Behavior is normal.  Nursing note and vitals reviewed. ED Results / Procedures / Treatments   Labs (all labs ordered are listed, but only abnormal results are displayed) Labs Reviewed  URINALYSIS, ROUTINE W REFLEX MICROSCOPIC - Abnormal; Notable for the following components:      Result Value   Color, Urine STRAW (*)    All other components within normal limits  BASIC METABOLIC PANEL - Abnormal; Notable for the following components:   Potassium 3.1 (*)    Glucose, Bld 101 (*)    All other components within normal limits  CBC - Abnormal; Notable for the following components:   HCT 38.4 (*)    All  other components within normal limits    EKG None  Radiology No results found.  Procedures Procedures (including critical care time)  Medications Ordered in ED Medications  lidocaine (LIDODERM) 5 % 1 patch (1 patch Transdermal Patch Applied 02/25/20 0935)  ibuprofen (ADVIL) tablet 800 mg (800 mg Oral Given 02/25/20 0936)  potassium chloride SA (KLOR-CON) CR tablet 40 mEq (40 mEq Oral Given 02/25/20 0936)   ED Course  I have reviewed the triage vital signs and the nursing notes.  Pertinent labs & imaging results that were available during my care of the patient were reviewed by me and considered in my medical decision making (see chart for details).  19 year old male presents for evaluation of left trapezius and lower back pain.  Afebrile, nonseptic, non-ill-appearing.  Both issues seem chronic in nature as lower back pain has been present x1 year, left trapezius pain x3 months.  He did recently move yesterday and was picking up boxes.  He has no radicular symptoms.  I am able to reproduce his pain to palpation.  He has no midline spinal tenderness, crepitus or step-offs.  No history IV drug use, bowel or bladder incontinence, saddle paresthesias.  No urinary complaints.  Low suspicion for acute neurosurgical emergency such as cauda equina, discitis, osteomyelitis, transverse myelitis, psoas abscess, course fracture, bacterial infectious process.   Patient without any midline cervical tenderness, crepitus or step-offs.  He has full range of motion without difficulty.  He has no meningismus.  Have low suspicion for meningitis, dissection.  His tenderness is actually more to his trapezius area which is likely due to his frequent dislocations to his left shoulder.  He is followed by orthopedics and is supposed to have surgical correction of this.  Patient states his trapezius pain is typically worsened after he dislocates his shoulder.  States he normally is able to reduce this himself.  This loss  occurred proximately 1 week ago.  He has full range of motion to his shoulder today.  He is neurovascularly intact.  His shoulder does not appear dislocated at this time.  Able to reproduce his trapezius pain palpable spasm to his left trapezius.  Symptoms seem likely musculoskeletal in nature.  Discussed anti-inflammatories, muscle relaxers and lidocaine patches.  He is allergy to Tylenol.  Do not feel he needs opiates at this time.  I did thorough discussion with patient follow-up with orthopedic surgery.  I do not feel he needs additional imaging at this time as patient states his symptoms are chronic in nature and have not changed.  He has no recent traumatic injuries.  He did have labs and UA obtained from triage which I personally reviewed and interpreted CBC without leukocytosis Metabolic panel with hypokalemia at 3.1, I discussed potassium rich foods with him and he will get this really checked at his PCP office. Received supplementation here in ED. Does not want medications for potassium replacement currently. He will have recheck and discuss with PCP for medical supplementation id still low after trial of diet changes. UA negative for infection, he does not have any urinary complaints.  Patient appears overall well, ambulating in ED without difficulty.  He is neurovascularly intact with a normal musculoskeletal exam aside for some palpable spasms.  Low suspicion for acute surgical emergency, septic joint, hemarthrosis, myositis, VTE, bacterial infectious process.  He does not have any upper respiratory complaints, he is PERC negative and low suspicion for atypical ACS, PE, dissection, pneumothorax as cause of his trapezius pain.  The patient has been appropriately medically screened and/or stabilized in the ED. I have low suspicion for any other emergent medical condition which would require further screening, evaluation or treatment in the ED or require inpatient management.  Patient is  hemodynamically stable and in no acute distress.  Patient able to ambulate in department prior to  ED.  Evaluation does not show acute pathology that would require ongoing or additional emergent interventions while in the emergency department or further inpatient treatment.  I have discussed the diagnosis with the patient and answered all questions.  Pain is been managed while in the emergency department and patient has no further complaints prior to discharge.  Patient is comfortable with plan discussed in room and is stable for discharge at this time.  I have discussed strict return precautions for returning to the emergency department.  Patient was encouraged to follow-up with PCP/specialist refer to at discharge.    MDM Rules/Calculators/A&P                           Final Clinical Impression(s) / ED Diagnoses Final diagnoses:  Acute bilateral low back pain without sciatica  Trapezius muscle spasm  Hypokalemia    Rx / DC Orders ED Discharge Orders         Ordered    naproxen (NAPROSYN) 500 MG tablet  2 times daily     Discontinue  Reprint     02/25/20 0939    methocarbamol (ROBAXIN) 500 MG tablet  2 times daily     Discontinue  Reprint     02/25/20 0939    lidocaine (LIDODERM) 5 %  Every 24 hours     Discontinue  Reprint     02/25/20 0939           Shardea Cwynar A, PA-C 02/25/20 0940    Maudie Flakes, MD 02/25/20 [phone]

## 2020-02-25 NOTE — ED Triage Notes (Signed)
Patient complaining of neck and back pain. Back has been hurting for a couple or years and neck has been hurting for three months.

## 2020-02-25 NOTE — Discharge Instructions (Addendum)
Your potassium level was a little low here.  You received supplementation.  I suggest increasing your foods at home that have increased potassium.  There is a handout attached to discharge paperwork with food recommendations.  I suggest you have follow-up with your primary care provider for recheck of your labs.  I have started you on anti-inflammatory, muscle relaxers and lidocaine patches.  With the lidocaine patches you placed for 12 hours and then you remove for 12 hours.  He mostly Patri for 12 hours before you can place additional patch.  Please continue to take the naproxen over the next few days consistently.  Take with food as this may upset your stomach.  The muscle relaxers may make you sleepy.  I recommend trialing them at night to see how they affect you.  Follow-up with orthopedics for your shoulder pain.  Return for any worsening symptoms.

## 2020-03-08 ENCOUNTER — Other Ambulatory Visit: Payer: Self-pay | Admitting: Orthopedic Surgery

## 2020-03-08 DIAGNOSIS — S43005D Unspecified dislocation of left shoulder joint, subsequent encounter: Secondary | ICD-10-CM

## 2020-03-08 DIAGNOSIS — G8929 Other chronic pain: Secondary | ICD-10-CM

## 2020-03-11 ENCOUNTER — Ambulatory Visit
Admission: RE | Admit: 2020-03-11 | Discharge: 2020-03-11 | Disposition: A | Discharge status: Home / Self Care | Payer: Commercial Managed Care - PPO | Source: Ambulatory Visit | Attending: Orthopedic Surgery | Admitting: Orthopedic Surgery

## 2020-03-11 ENCOUNTER — Encounter: Payer: Commercial Managed Care - PPO | Admitting: Family Medicine

## 2020-03-11 DIAGNOSIS — M25512 Pain in left shoulder: Secondary | ICD-10-CM

## 2020-03-11 DIAGNOSIS — G8929 Other chronic pain: Secondary | ICD-10-CM

## 2020-03-11 DIAGNOSIS — S43005D Unspecified dislocation of left shoulder joint, subsequent encounter: Secondary | ICD-10-CM

## 2020-03-11 IMAGING — CT CT SHOULDER*L* W/O CM
2 of 3 series · 12 of 20 positions shown, 15 images · non-contrast
Comparison: MRI left shoulder arthrogram dated [DATE]. Left
shoulder x-rays dated [DATE].

CLINICAL DATA: Left shoulder instability with recurrent
dislocations. No prior surgery.

EXAM:
CT OF THE UPPER LEFT EXTREMITY WITHOUT CONTRAST
TECHNIQUE: Multidetector CT imaging of the left shoulder was performed
according to the standard protocol. 3-dimensional CT images were
rendered by post-processing of the original CT data on an
acquisition workstation. The 3-dimensional CT images were
interpreted and findings were reported in the accompanying complete
CT report for this study.

[Series 3: shoulder 0.60 br40 s3 thins soft · axial · 0.46mm/px · z∈[-1094,-937]mm · 9 of 328 slices shown, 12 images]
[im 33/328  soft-tissue]
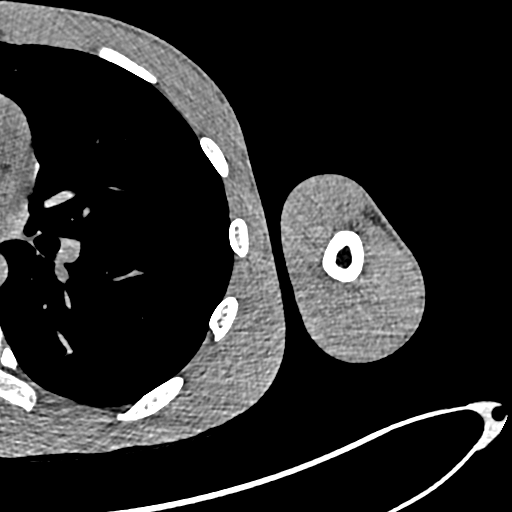
[im 33/328  bone]
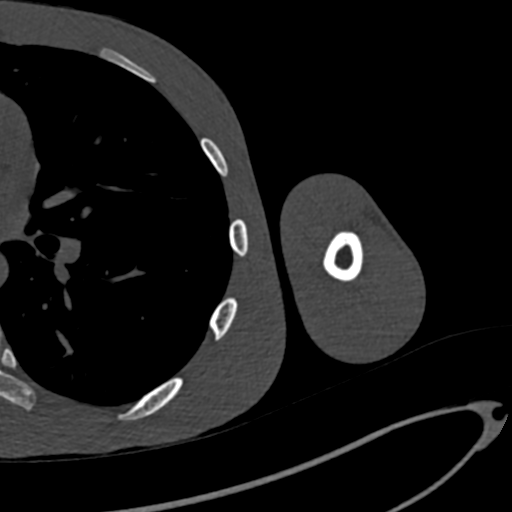
[im 66/328  bone]
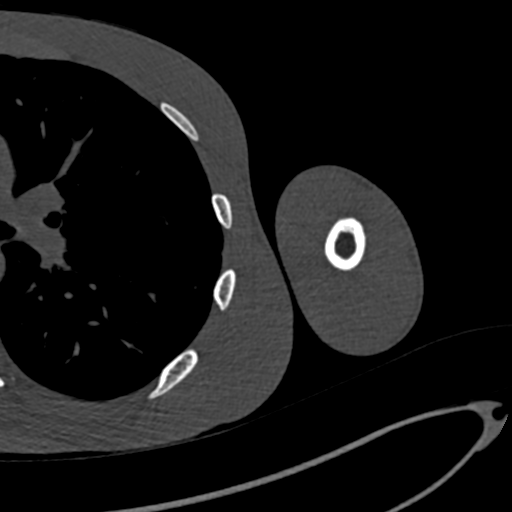
[im 99/328  bone]
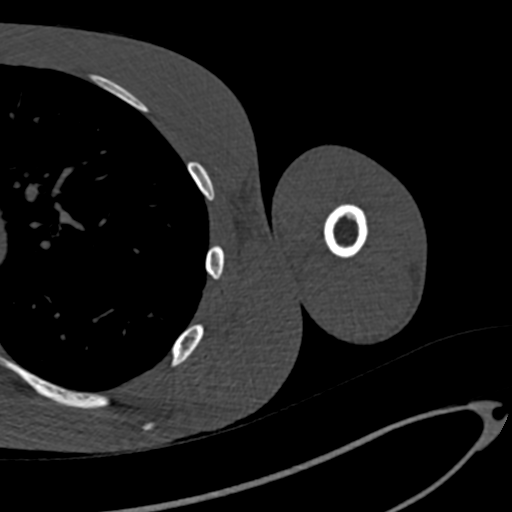
[im 131/328  bone]
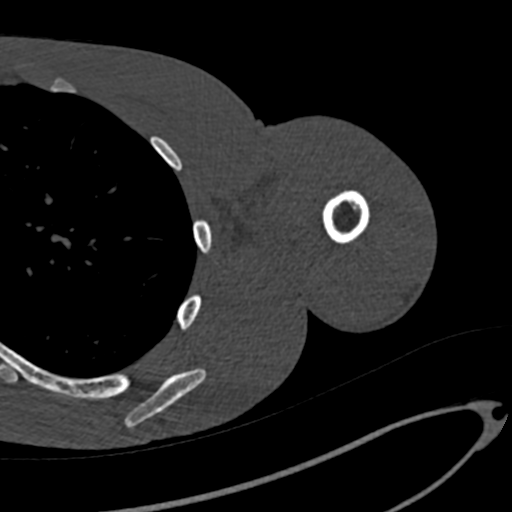
[im 164/328  soft-tissue]
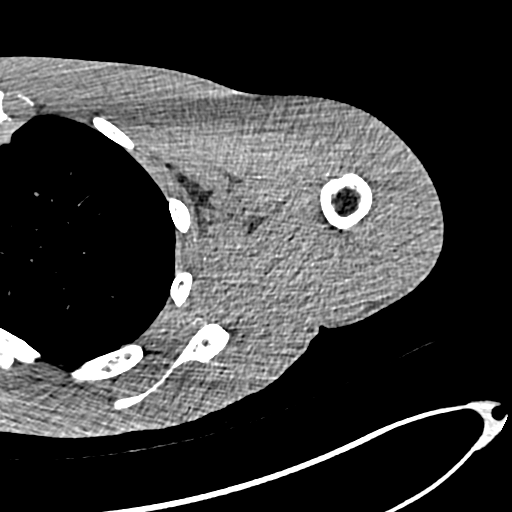
[im 164/328  bone]
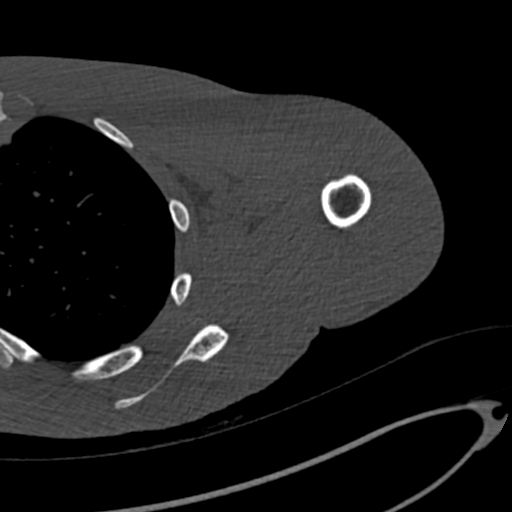
[im 197/328  bone]
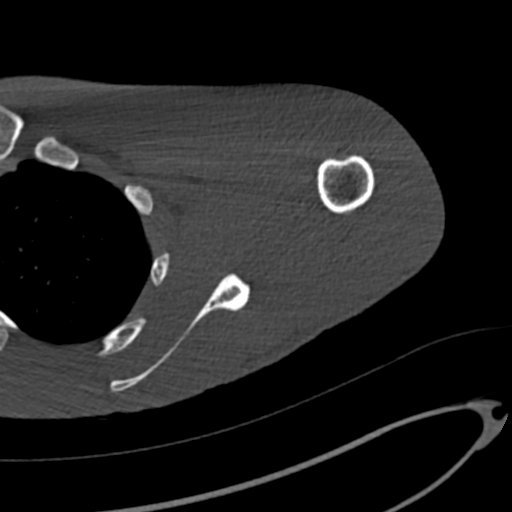
[im 229/328  bone]
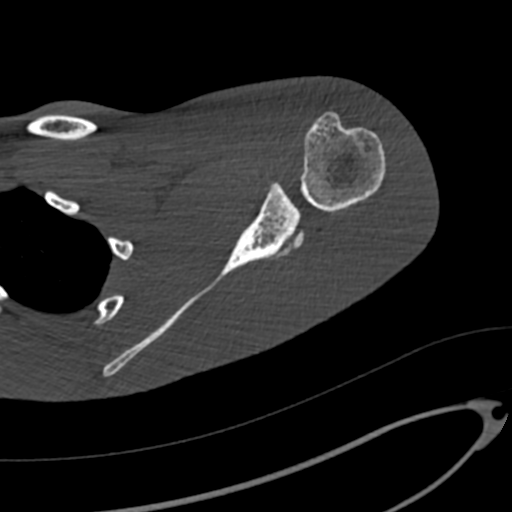
[im 262/328  bone]
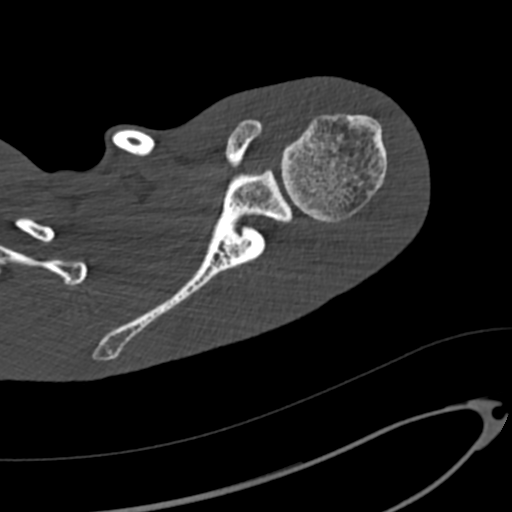
[im 295/328  soft-tissue]
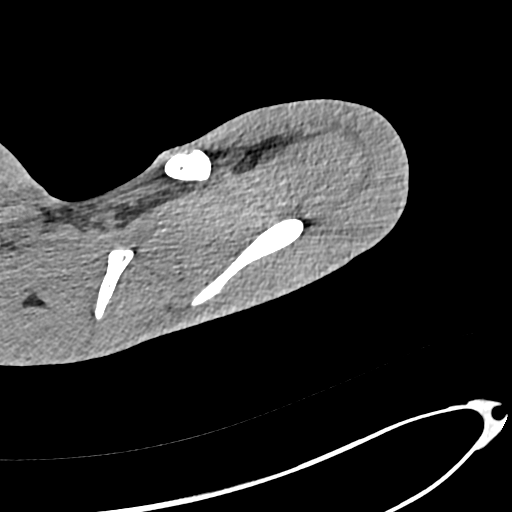
[im 295/328  bone]
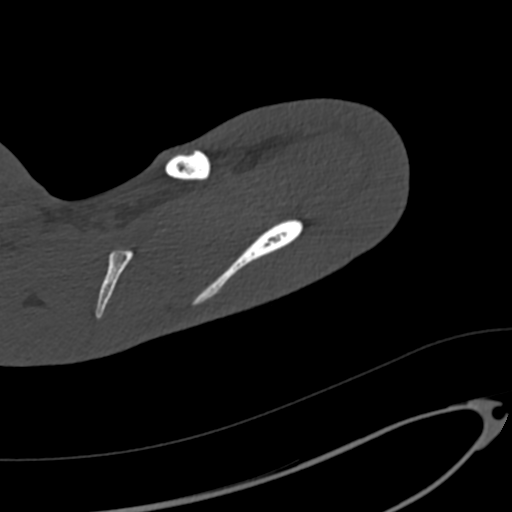

[Series 9: shoulder 2.00 br40 s3 cor soft · coronal · 0.39mm/px · 3 of 107 slices shown]
[im 22/107  bone]
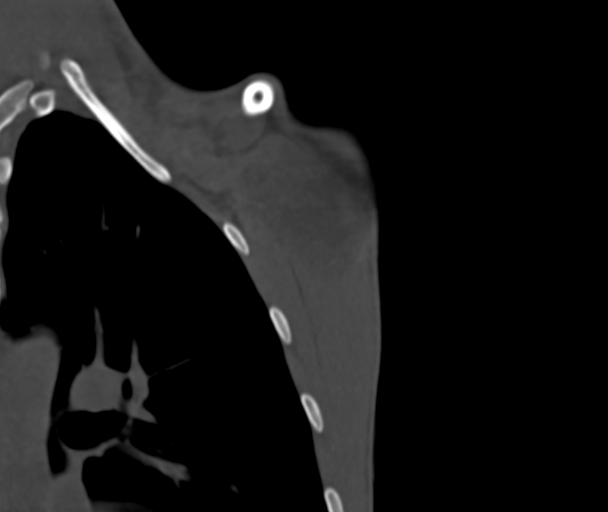
[im 43/107  bone]
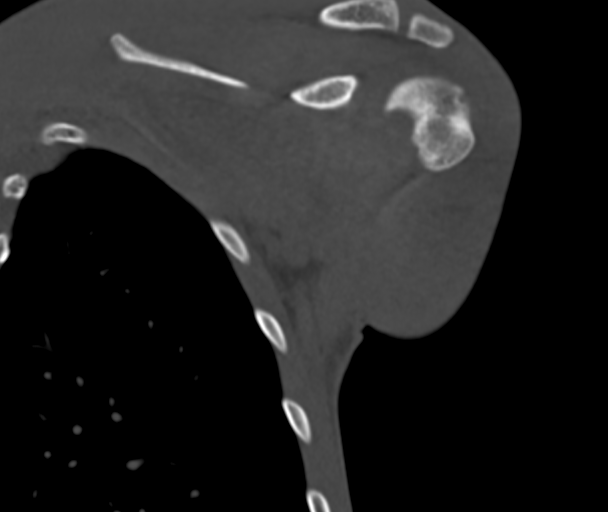
[im 64/107  bone]
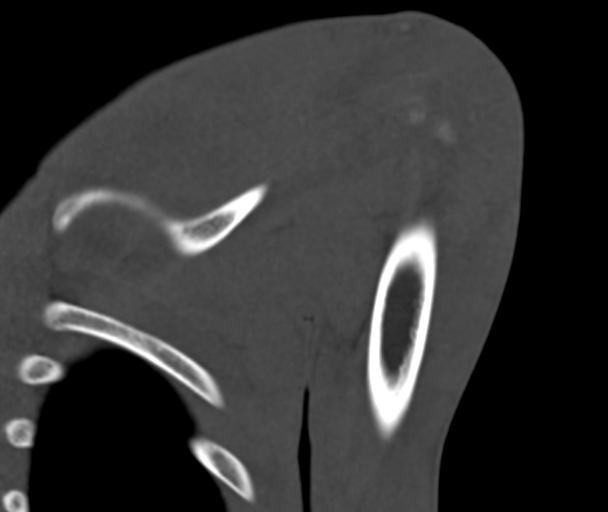

[12 of 20 positions shown; findings below may reference images not displayed]

FINDINGS: Bones/Joint/Cartilage

Chronic impaction fracture of the anteromedial humeral head. Chronic
mildly displaced and fragmented curvilinear posterior glenoid
fracture involving approximately 15% of the glenoid surface (series
15, image 80). No acute fracture or dislocation. Normal
acromioclavicular joint. No joint effusion.

Ligaments

Ligaments are suboptimally evaluated by CT.

Muscles and Tendons
Grossly intact.  No muscle atrophy.

Soft tissue
No fluid collection or hematoma.  No soft tissue mass.
IMPRESSION: 1. Sequelae of prior posterior shoulder dislocation with chronic
reverse Hill-Sachs deformity of the humeral head and chronic reverse
bony Bankart lesion of the posterior glenoid involving approximately
15% of the glenoid surface.

## 2020-03-12 ENCOUNTER — Encounter: Payer: Self-pay | Admitting: Family Medicine

## 2020-03-12 ENCOUNTER — Other Ambulatory Visit (HOSPITAL_COMMUNITY)
Admission: RE | Admit: 2020-03-12 | Discharge: 2020-03-12 | Disposition: A | Discharge status: Home / Self Care | Payer: Commercial Managed Care - PPO | Source: Ambulatory Visit | Attending: Family Medicine | Admitting: Family Medicine

## 2020-03-12 ENCOUNTER — Ambulatory Visit (INDEPENDENT_AMBULATORY_CARE_PROVIDER_SITE_OTHER): Payer: Commercial Managed Care - PPO | Admitting: Family Medicine

## 2020-03-12 ENCOUNTER — Other Ambulatory Visit: Payer: Self-pay

## 2020-03-12 ENCOUNTER — Other Ambulatory Visit: Payer: Commercial Managed Care - PPO

## 2020-03-12 VITALS — BP 94/64 | HR 63 | Temp 98.3°F | Ht 72.0 in | Wt 121.0 lb

## 2020-03-12 DIAGNOSIS — Z0000 Encounter for general adult medical examination without abnormal findings: Secondary | ICD-10-CM | POA: Insufficient documentation

## 2020-03-12 MED ORDER — ALBUTEROL SULFATE HFA 108 (90 BASE) MCG/ACT IN AERS: 2.0000 | INHALATION_SPRAY | RESPIRATORY_TRACT | 1 refills | Status: DC | PRN

## 2020-03-12 NOTE — Progress Notes (Signed)
Established Patient Office Visit  Subjective:  Patient ID: Terrence Riley, male    DOB: [birth year]  Age: 19 y.o. MRN: [id number]  CC: No chief complaint on file.   HPI Terrence Riley presents for physical exam.  He is getting ready to start college at [employer] college in [place].  He hopes to play basketball there.  He has history of seizure disorder which is treated with Topamax and Keppra.  No recent seizures.  No reported seizure in several years.    He has had some problems with recurrent left shoulder dislocation and is followed by orthopedics.  He reportedly has labrum tear and they are looking at possible surgery at some point.  He was seen in the recently in the ER with some low back pain.  Potassium came back 3.1.  Back pain improved at this time.  He has been playing quite a bit of basketball recently and staying very active with moving from home to school.  Of note, his weight is down from 128 pounds 121 currently but he states he has excellent appetite and "is eating all the time".  He states he had tendency toward loose stools in the past but not consistently.  No abdominal pain.  No headaches.  No adenopathy.  Immunizations reviewed and he has basically completely up-to-date.  Will need Tdap by next year.  He has had both Menimune B an Menactra vaccines  He has been sexually active and usually uses barrier protection but not consistently.  No recent genitourinary symptoms.  No recent STD screening.  Past Medical History:  Diagnosis Date  . Allergy    rhinitis  . Asthma    as a child  . Depression   . Eosinophilic esophagitis    heartburn and reflux  . Family history of adverse reaction to anesthesia    mother respiratory failure  . Nasal fracture    deviated septum  . Premature baby    2 months early  . Seizures ([place])    well controlled on meds/ one 2 months ago when meds were late    Past Surgical History:  Procedure Laterality Date  .  ADENOIDECTOMY    . CLOSED REDUCTION NASAL FRACTURE N/A 08/31/2017   Procedure: CLOSED REDUCTION NASAL FRACTURE;  Surgeon: Clyde Canterbury, MD;  Location: [place];  Service: ENT;  Laterality: N/A;  . SEPTOPLASTY N/A 08/31/2017   Procedure: SEPTOPLASTY;  Surgeon: Clyde Canterbury, MD;  Location: [place];  Service: ENT;  Laterality: N/A;  . TONSILLECTOMY     and addenoids    Family History  Problem Relation Age of Onset  . Cancer Mother        breat  . Protein S deficiency Mother     Social History   Socioeconomic History  . Marital status: Single    Spouse name: Not on file  . Number of children: Not on file  . Years of education: Not on file  . Highest education level: Not on file  Occupational History  . Not on file  Tobacco Use  . Smoking status: Never Smoker  . Smokeless tobacco: Never Used  Vaping Use  . Vaping Use: Never used  Substance and Sexual Activity  . Alcohol use: No  . Drug use: No  . Sexual activity: Not on file  Other Topics Concern  . Not on file  Social History Narrative  . Not on file   Social Determinants of Health   Financial Resource Strain:   .  Difficulty of Paying Living Expenses:   Food Insecurity:   . Worried About Charity fundraiser in the Last Year:   . Arboriculturist in the Last Year:   Transportation Needs:   . Film/video editor (Medical):   Marland Kitchen Lack of Transportation (Non-Medical):   Physical Activity:   . Days of Exercise per Week:   . Minutes of Exercise per Session:   Stress:   . Feeling of Stress :   Social Connections:   . Frequency of Communication with Friends and Family:   . Frequency of Social Gatherings with Friends and Family:   . Attends Religious Services:   . Active Member of Clubs or Organizations:   . Attends Archivist Meetings:   Marland Kitchen Marital Status:   Intimate Partner Violence:   . Fear of Current or Ex-Partner:   . Emotionally Abused:   Marland Kitchen Physically Abused:   . Sexually  Abused:     Outpatient Medications Prior to Visit  Medication Sig Dispense Refill  . albuterol (PROVENTIL) (2.5 MG/3ML) 0.083% nebulizer solution Take 3 mLs (2.5 mg total) by nebulization every 4 (four) hours as needed for wheezing or shortness of breath. 30 vial 0  . cetirizine (ZYRTEC) 10 MG tablet Take 10 mg by mouth daily as needed for allergies.     . Cholecalciferol (VITAMIN D-1000 MAX ST) 25 MCG (1000 UT) tablet Take 1,000 Units by mouth daily.     . clonazePAM (KLONOPIN) 0.5 MG tablet Take 0.5 mg by mouth daily. bedtime    . EPINEPHrine 0.3 mg/0.3 mL IJ SOAJ injection Inject 0.3 mg into the muscle once as needed for anaphylaxis. Allergic reaction    . levETIRAcetam (KEPPRA) 1000 MG tablet Take 1,000 mg by mouth 2 (two) times daily. Am and pm    . lidocaine (LIDODERM) 5 % Place 1 patch onto the skin daily. Remove & Discard patch within 12 hours or as directed by MD 30 patch 0  . methocarbamol (ROBAXIN) 500 MG tablet Take 1 tablet (500 mg total) by mouth 2 (two) times daily. 20 tablet 0  . midazolam (VERSED) 5 MG/ML injection Place 2 mLs into the nose every 5 (five) minutes as needed (seizures). Draw up prescribed dose (ml) in syringe, remove blue vial access device, then attach syringe to nasal atomizer for intranasal administration.     . naproxen (NAPROSYN) 500 MG tablet Take 1 tablet (500 mg total) by mouth 2 (two) times daily. 30 tablet 0  . PRESCRIPTION MEDICATION Apply 1 application topically 3 (three) times daily as needed. Nausea - Phenergan Cream-    . topiramate (TOPAMAX) 200 MG tablet Take 200 mg by mouth 2 (two) times daily.     Marland Kitchen albuterol (PROVENTIL HFA;VENTOLIN HFA) 108 (90 Base) MCG/ACT inhaler Inhale 2 puffs into the lungs every 4 (four) hours as needed for wheezing.      No facility-administered medications prior to visit.    Allergies  Allergen Reactions  . Lortab [Hydrocodone-Acetaminophen] Hives  . Other Anaphylaxis    Peanuts  . Zofran [Ondansetron Hcl] Nausea  And Vomiting  . Citrus Other (See Comments)    Sores in mouth  . Dairy Aid [Lactase] Nausea And Vomiting  . Flu Virus Vaccine Other (See Comments)  . Soy Allergy Other (See Comments)    Seizures   . Tylenol [Acetaminophen]     Can cause a fever and seizure activity   . Eggs Or Egg-Derived Products Rash    Pt mother reports  pt only avoids eggs alone but can tolerate as an ingredient. [facility] 07/27/13 Pt mother reports pt only avoids eggs alone but can tolerate as an ingredient. [facility] 07/27/13 Pt mother reports pt only avoids eggs alone but can tolerate as an ingredient. [facility] 07/27/13   . Hydrocodone-Acetaminophen Rash    ROS Review of Systems  Constitutional: Negative for activity change, appetite change, fatigue and fever.  HENT: Negative for congestion, ear pain and trouble swallowing.   Eyes: Negative for pain and visual disturbance.  Respiratory: Negative for cough, shortness of breath and wheezing.   Cardiovascular: Negative for chest pain and palpitations.  Gastrointestinal: Negative for abdominal distention, abdominal pain, blood in stool, constipation, diarrhea, nausea, rectal pain and vomiting.  Endocrine: Negative for polydipsia and polyuria.  Genitourinary: Negative for dysuria, hematuria and testicular pain.  Musculoskeletal: Negative for arthralgias and joint swelling.  Skin: Negative for rash.  Neurological: Negative for dizziness, syncope, weakness and headaches.  Hematological: Negative for adenopathy. Does not bruise/bleed easily.  Psychiatric/Behavioral: Negative for confusion and dysphoric mood.      Objective:    Physical Exam Vitals reviewed.  Constitutional:      General: He is not in acute distress.    Appearance: He is not ill-appearing or toxic-appearing.     Comments: Alert thin 19 year old male  HENT:     Head: Normocephalic and atraumatic.  Eyes:     Pupils: Pupils are equal, round, and reactive to light.  Cardiovascular:     Rate and Rhythm: Normal  rate and regular rhythm.     Heart sounds: No murmur heard.   Pulmonary:     Effort: Pulmonary effort is normal.     Breath sounds: Normal breath sounds.  Abdominal:     Palpations: Abdomen is soft. There is no mass.     Tenderness: There is no abdominal tenderness. There is no guarding or rebound.  Musculoskeletal:     Cervical back: Neck supple.     Right lower leg: No edema.     Left lower leg: No edema.  Lymphadenopathy:     Cervical: No cervical adenopathy.  Skin:    Findings: No rash.  Neurological:     General: No focal deficit present.     Mental Status: He is alert.     Cranial Nerves: No cranial nerve deficit.  Psychiatric:        Mood and Affect: Mood normal.        Thought Content: Thought content normal.     BP 94/64 (BP Location: Left Arm, Patient Position: Sitting, Cuff Size: Normal)   Pulse 63   Temp 98.3 F (36.8 C) (Oral)   Ht 6' (1.829 m)   Wt 121 lb (54.9 kg)   SpO2 99%   BMI 16.41 kg/m  Wt Readings from Last 3 Encounters:  03/12/20 121 lb (54.9 kg) (4 %, Z= -1.73)*  02/25/20 135 lb (61.2 kg) (18 %, Z= -0.91)*  02/13/20 128 lb (58.1 kg) (10 %, Z= -1.29)*   * Growth percentiles are based on CDC (Boys, 2-20 Years) data.     Health Maintenance Due  Topic Date Due  . Hepatitis C Screening  Never done  . HIV Screening  Never done  . INFLUENZA VACCINE  02/25/2020    There are no preventive care reminders to display for this patient.  No results found for: TSH Lab Results  Component Value Date   WBC 4.0 02/25/2020   HGB 13.3 02/25/2020   HCT 38.4 (L)  02/25/2020   MCV 83.3 02/25/2020   PLT 155 02/25/2020   Lab Results  Component Value Date   NA 138 02/25/2020   K 3.1 (L) 02/25/2020   CO2 22 02/25/2020   GLUCOSE 101 (H) 02/25/2020   BUN 12 02/25/2020   CREATININE 1.01 02/25/2020   BILITOT 1.0 12/26/2016   ALKPHOS 120 12/26/2016   AST 34 12/26/2016   ALT 32 12/26/2016   PROT 7.6 12/26/2016   ALBUMIN 4.0 12/26/2016   CALCIUM 9.0  02/25/2020   ANIONGAP 9 02/25/2020   No results found for: CHOL No results found for: HDL No results found for: LDLCALC No results found for: TRIG No results found for: CHOLHDL No results found for: HGBA1C    Assessment & Plan:   Problem List Items Addressed This Visit    None    Visit Diagnoses    Physical exam    -  Primary   Relevant Orders   TSH   CMP   Lipid panel   HIV antibody (with reflex)   Hep C Antibody   RPR   Urine cytology ancillary only   Sickle Cell Screen    Generally healthy 19 year old male.  He is underweight for height with BMI less than 20.  He has had some recent mild weight loss but attributes this to increased activity.  He states he has excellent appetite and is eating several times per day.  He did have recent mild hyperkalemia by recent labs in the ER.  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  We also discussed STD screening since he has had some occasional unprotected intercourse  -Obtain basic follow-up labs including comprehensive metabolic panel, TSH, hepatitis C antibody, HIV, RPR, GC, chlamydia, sickle cell screen.  Did not obtain CBC as he had this very recently in the ER and unremarkable  -Discussed importance of potassium replacement especially with his frequent sports activity with either sports drink or foods high in potassium such as banana, white beans, other fruit, avocado, potatoes, etc.  -Immunizations are up-to-date  -Will need approval from his orthopedist for sports participation from the perspective of his recurrent left shoulder dislocations  -Discussed STD prevention and recommend barrier protection at all times  -He has history of mild intermittent asthma we refilled his albuterol to have at school.  He also reports history of anaphylaxis type symptoms with peanuts and he states he currently has EpiPen which is up-to-date.  He is encouraged to make sure this  is not out of date and to keep with him at all times  Meds ordered this encounter  Medications  . albuterol (VENTOLIN HFA) 108 (90 Base) MCG/ACT inhaler    Sig: Inhale 2 puffs into the lungs every 4 (four) hours as needed for wheezing.    Dispense:  18 g    Refill:  1    Follow-up: No follow-ups on file.    Carolann Littler, MD

## 2020-03-12 NOTE — Patient Instructions (Signed)
Preventive Care 23-20 Years Old, Male Preventive care refers to lifestyle choices and visits with your health care provider that can promote health and wellness. At this stage in your life, you may start seeing a primary care physician instead of a pediatrician. Your health care is now your responsibility. Preventive care for young adults includes:  A yearly physical exam. This is also called an annual wellness visit.  Regular dental and eye exams.  Immunizations.  Screening for certain conditions.  Healthy lifestyle choices, such as diet and exercise. What can I expect for my preventive care visit? Physical exam Your health care provider may check:  Height and weight. These may be used to calculate body mass index (BMI), which is a measurement that tells if you are at a healthy weight.  Heart rate and blood pressure.  Body temperature. Counseling Your health care provider may ask you questions about:  Past medical problems and family medical history.  Alcohol, tobacco, and drug use.  Home and relationship well-being.  Access to firearms.  Emotional well-being.  Diet, exercise, and sleep habits.  Sexual activity and sexual health. What immunizations do I need?  Influenza (flu) vaccine  This is recommended every year. Tetanus, diphtheria, and pertussis (Tdap) vaccine  You may need a Td booster every 10 years. Varicella (chickenpox) vaccine  You may need this vaccine if you have not already been vaccinated. Human papillomavirus (HPV) vaccine  If recommended by your health care provider, you may need three doses over 6 months. Measles, mumps, and rubella (MMR) vaccine  You may need at least one dose of MMR. You may also need a second dose. Meningococcal conjugate (MenACWY) vaccine  One dose is recommended if you are 68-54 years old and a Market researcher living in a residence hall, or if you have one of several medical conditions. You may also need  additional booster doses. Pneumococcal conjugate (PCV13) vaccine  You may need this if you have certain conditions and were not previously vaccinated. Pneumococcal polysaccharide (PPSV23) vaccine  You may need one or two doses if you smoke cigarettes or if you have certain conditions. Hepatitis A vaccine  You may need this if you have certain conditions or if you travel or work in places where you may be exposed to hepatitis A. Hepatitis B vaccine  You may need this if you have certain conditions or if you travel or work in places where you may be exposed to hepatitis B. Haemophilus influenzae type b (Hib) vaccine  You may need this if you have certain risk factors. You may receive vaccines as individual doses or as more than one vaccine together in one shot (combination vaccines). Talk with your health care provider about the risks and benefits of combination vaccines. What tests do I need? Blood tests  Lipid and cholesterol levels. These may be checked every 5 years starting at age 24.  Hepatitis C test.  Hepatitis B test. Screening  Genital exam to check for testicular cancer or hernias.  Sexually transmitted disease (STD) testing, if you are at risk. Other tests  Tuberculosis skin test.  Vision and hearing tests.  Skin exam. Follow these instructions at home: Eating and drinking   Eat a diet that includes fresh fruits and vegetables, whole grains, lean protein, and low-fat dairy products.  Drink enough fluid to keep your urine pale yellow.  Do not drink alcohol if: ? Your health care provider tells you not to drink. ? You are under the legal drinking  age. In the [place] the legal drinking age is 7.  If you drink alcohol: ? Limit how much you have to 0-2 drinks a day. ? Be aware of how much alcohol is in your drink. In the [place] one drink equals one 12 oz bottle of beer (355 mL), one 5 oz glass of wine (148 mL), or one 1 oz glass of hard liquor (44  mL). Lifestyle  Take daily care of your teeth and gums.  Stay active. Exercise at least 30 minutes 5 or more days of the week.  Do not use any products that contain nicotine or tobacco, such as cigarettes, e-cigarettes, and chewing tobacco. If you need help quitting, ask your health care provider.  Do not use drugs.  If you are sexually active, practice safe sex. Use a condom or other form of protection to prevent STIs (sexually transmitted infections).  Find healthy ways to cope with stress, such as: ? Meditation, yoga, or listening to music. ? Journaling. ? Talking to a trusted person. ? Spending time with friends and family. Safety  Always wear your seat belt while driving or riding in a vehicle.  Do not drive if you have been drinking alcohol.  Do not ride with someone who has been drinking.  Do not drive when you are tired or distracted.  Do not text while driving.  Wear a helmet and other protective equipment during sports activities.  If you have firearms in your house, make sure you follow all gun safety procedures.  Seek help if you have been bullied, physically abused, or sexually abused.  Use the Internet responsibly to avoid dangers such as online bullying and online sex predators. What's next?  Go to your health care provider once a year for a well check visit.  Ask your health care provider how often you should have your eyes and teeth checked.  Stay up to date on all vaccines. This information is not intended to replace advice given to you by your health care provider. Make sure you discuss any questions you have with your health care provider. Document Revised: 07/07/2018 Document Reviewed: 07/07/2018 Elsevier Patient Education  2020 [employer].

## 2020-03-13 LAB — COMPREHENSIVE METABOLIC PANEL
AG Ratio: 2.1 (calc) (ref 1.0–2.5)
ALT: 22 U/L (ref 8–46)
AST: 21 U/L (ref 12–32)
Albumin: 4.5 g/dL (ref 3.6–5.1)
Alkaline phosphatase (APISO): 80 U/L (ref 46–169)
BUN: 9 mg/dL (ref 7–20)
CO2: 21 mmol/L (ref 20–32)
Calcium: 9.6 mg/dL (ref 8.9–10.4)
Chloride: 108 mmol/L (ref 98–110)
Creat: 1.08 mg/dL (ref 0.60–1.26)
Globulin: 2.1 g/dL (calc) (ref 2.1–3.5)
Glucose, Bld: 81 mg/dL (ref 65–99)
Potassium: 3.8 mmol/L (ref 3.8–5.1)
Sodium: 141 mmol/L (ref 135–146)
Total Bilirubin: 0.9 mg/dL (ref 0.2–1.1)
Total Protein: 6.6 g/dL (ref 6.3–8.2)

## 2020-03-13 LAB — URINE CYTOLOGY ANCILLARY ONLY
Chlamydia: NEGATIVE
Comment: NEGATIVE
Comment: NORMAL
Neisseria Gonorrhea: NEGATIVE

## 2020-03-13 LAB — RPR: RPR Ser Ql: NONREACTIVE

## 2020-03-13 LAB — LIPID PANEL
Cholesterol: 125 mg/dL (ref <170)
HDL: 49 mg/dL (ref >45)
LDL Cholesterol (Calc): 63 mg/dL (calc) (ref <110)
Non-HDL Cholesterol (Calc): 76 mg/dL (calc) (ref <120)
Total CHOL/HDL Ratio: 2.6 (calc) (ref <5.0)
Triglycerides: 51 mg/dL (ref <90)

## 2020-03-13 LAB — TSH: TSH: 1.08 mIU/L (ref 0.50–4.30)

## 2020-03-13 LAB — HEPATITIS C ANTIBODY
Hepatitis C Ab: NONREACTIVE
SIGNAL TO CUT-OFF: 0 (ref <1.00)

## 2020-03-13 LAB — HIV ANTIBODY (ROUTINE TESTING W REFLEX): HIV 1&2 Ab, 4th Generation: NONREACTIVE

## 2020-03-13 LAB — SICKLE CELL SCREEN: Sickle Solubility Test - HGBRFX: NEGATIVE

## 2020-03-14 ENCOUNTER — Telehealth: Payer: Self-pay

## 2020-03-14 NOTE — Telephone Encounter (Signed)
Per Dr Marlou Sa, tried calling patients mom-no answer. Unable to LM.  Dr Marlou Sa wanted me  to advise that he had reviewed scan and he has a surgical plan for patient--the surgical plan is a very involved procedure that would require him to have incisions anterior and posterior with bone graft. He will see patient and mother at his scheduled post op appt.

## 2020-03-19 ENCOUNTER — Other Ambulatory Visit: Payer: Self-pay

## 2020-03-20 ENCOUNTER — Ambulatory Visit (INDEPENDENT_AMBULATORY_CARE_PROVIDER_SITE_OTHER): Payer: Commercial Managed Care - PPO | Admitting: Family Medicine

## 2020-03-20 ENCOUNTER — Encounter: Payer: Self-pay | Admitting: Family Medicine

## 2020-03-20 VITALS — BP 102/64 | HR 74 | Temp 98.1°F | Wt 123.8 lb

## 2020-03-20 DIAGNOSIS — H1012 Acute atopic conjunctivitis, left eye: Secondary | ICD-10-CM

## 2020-03-20 NOTE — Progress Notes (Signed)
Established Patient Office Visit  Subjective:  Patient ID: Terrence Riley, male    DOB: 02/08/2001  Age: 19 y.o. MRN: [id number]  CC:  Chief Complaint  Patient presents with  . Eye Problem    left - red and itching     HPI Terrence Riley presents for pruritic left eye.  Started couple days ago.  Denies any injury or foreign body.  He used some type of over-the-counter drops which seem to help.  He has had no itching or discharge today.  He had some mild matting yesterday morning but this has resolved.  No blurred vision.  No eye pain.  No contact use.  He had requested refill of Klonopin.  He has longstanding seizure disorder and is actually followed through [facility] for his seizure medications.  We have requested that he get this through neurology since this is a controlled medication and should come consistently through one prescriber.  Past Medical History:  Diagnosis Date  . Allergy    rhinitis  . Asthma    as a child  . Depression   . Eosinophilic esophagitis    heartburn and reflux  . Family history of adverse reaction to anesthesia    mother respiratory failure  . Nasal fracture    deviated septum  . Premature baby    2 months early  . Seizures ([place])    well controlled on meds/ one 2 months ago when meds were late    Past Surgical History:  Procedure Laterality Date  . ADENOIDECTOMY    . CLOSED REDUCTION NASAL FRACTURE N/A 08/31/2017   Procedure: CLOSED REDUCTION NASAL FRACTURE;  Surgeon: Clyde Canterbury, MD;  Location: [place];  Service: ENT;  Laterality: N/A;  . SEPTOPLASTY N/A 08/31/2017   Procedure: SEPTOPLASTY;  Surgeon: Clyde Canterbury, MD;  Location: [place];  Service: ENT;  Laterality: N/A;  . TONSILLECTOMY     and addenoids    Family History  Problem Relation Age of Onset  . Cancer Mother        breat  . Protein S deficiency Mother     Social History   Socioeconomic History  . Marital status: Single     Spouse name: Not on file  . Number of children: Not on file  . Years of education: Not on file  . Highest education level: Not on file  Occupational History  . Not on file  Tobacco Use  . Smoking status: Never Smoker  . Smokeless tobacco: Never Used  Vaping Use  . Vaping Use: Never used  Substance and Sexual Activity  . Alcohol use: No  . Drug use: No  . Sexual activity: Not on file  Other Topics Concern  . Not on file  Social History Narrative  . Not on file   Social Determinants of Health   Financial Resource Strain:   . Difficulty of Paying Living Expenses: Not on file  Food Insecurity:   . Worried About Charity fundraiser in the Last Year: Not on file  . Ran Out of Food in the Last Year: Not on file  Transportation Needs:   . Lack of Transportation (Medical): Not on file  . Lack of Transportation (Non-Medical): Not on file  Physical Activity:   . Days of Exercise per Week: Not on file  . Minutes of Exercise per Session: Not on file  Stress:   . Feeling of Stress : Not on file  Social Connections:   .  Frequency of Communication with Friends and Family: Not on file  . Frequency of Social Gatherings with Friends and Family: Not on file  . Attends Religious Services: Not on file  . Active Member of Clubs or Organizations: Not on file  . Attends Archivist Meetings: Not on file  . Marital Status: Not on file  Intimate Partner Violence:   . Fear of Current or Ex-Partner: Not on file  . Emotionally Abused: Not on file  . Physically Abused: Not on file  . Sexually Abused: Not on file    Outpatient Medications Prior to Visit  Medication Sig Dispense Refill  . albuterol (PROVENTIL) (2.5 MG/3ML) 0.083% nebulizer solution Take 3 mLs (2.5 mg total) by nebulization every 4 (four) hours as needed for wheezing or shortness of breath. 30 vial 0  . albuterol (VENTOLIN HFA) 108 (90 Base) MCG/ACT inhaler Inhale 2 puffs into the lungs every 4 (four) hours as needed for  wheezing. 18 g 1  . cetirizine (ZYRTEC) 10 MG tablet Take 10 mg by mouth daily as needed for allergies.     . Cholecalciferol (VITAMIN D-1000 MAX ST) 25 MCG (1000 UT) tablet Take 1,000 Units by mouth daily.     . clonazePAM (KLONOPIN) 0.5 MG tablet Take 0.5 mg by mouth daily. bedtime    . EPINEPHrine 0.3 mg/0.3 mL IJ SOAJ injection Inject 0.3 mg into the muscle once as needed for anaphylaxis. Allergic reaction    . levETIRAcetam (KEPPRA) 1000 MG tablet Take 1,000 mg by mouth 2 (two) times daily. Am and pm    . lidocaine (LIDODERM) 5 % Place 1 patch onto the skin daily. Remove & Discard patch within 12 hours or as directed by MD 30 patch 0  . methocarbamol (ROBAXIN) 500 MG tablet Take 1 tablet (500 mg total) by mouth 2 (two) times daily. 20 tablet 0  . midazolam (VERSED) 5 MG/ML injection Place 2 mLs into the nose every 5 (five) minutes as needed (seizures). Draw up prescribed dose (ml) in syringe, remove blue vial access device, then attach syringe to nasal atomizer for intranasal administration.     . naproxen (NAPROSYN) 500 MG tablet Take 1 tablet (500 mg total) by mouth 2 (two) times daily. 30 tablet 0  . PRESCRIPTION MEDICATION Apply 1 application topically 3 (three) times daily as needed. Nausea - Phenergan Cream-    . topiramate (TOPAMAX) 200 MG tablet Take 200 mg by mouth 2 (two) times daily.      No facility-administered medications prior to visit.    Allergies  Allergen Reactions  . Lortab [Hydrocodone-Acetaminophen] Hives  . Other Anaphylaxis    Peanuts  . Zofran [Ondansetron Hcl] Nausea And Vomiting  . Citrus Other (See Comments)    Sores in mouth  . Dairy Aid [Lactase] Nausea And Vomiting  . Flu Virus Vaccine Other (See Comments)  . Soy Allergy Other (See Comments)    Seizures   . Tylenol [Acetaminophen]     Can cause a fever and seizure activity   . Eggs Or Egg-Derived Products Rash    Pt mother reports pt only avoids eggs alone but can tolerate as an ingredient. [facility]  07/27/13 Pt mother reports pt only avoids eggs alone but can tolerate as an ingredient. [facility] 07/27/13 Pt mother reports pt only avoids eggs alone but can tolerate as an ingredient. [facility] 07/27/13   . Hydrocodone-Acetaminophen Rash    ROS Review of Systems  Constitutional: Negative for chills and fever.  Eyes: Positive for itching.  Negative for photophobia, pain, redness and visual disturbance.  Neurological: Negative for headaches.      Objective:    Physical Exam Vitals reviewed.  Constitutional:      Appearance: Normal appearance.  Eyes:     General:        Right eye: No discharge.        Left eye: No discharge.     Extraocular Movements: Extraocular movements intact.     Conjunctiva/sclera: Conjunctivae normal.     Pupils: Pupils are equal, round, and reactive to light.  Cardiovascular:     Rate and Rhythm: Normal rate and regular rhythm.  Pulmonary:     Effort: Pulmonary effort is normal.     Breath sounds: Normal breath sounds.  Neurological:     Mental Status: He is alert.     BP 102/64 (BP Location: Right Arm, Patient Position: Sitting, Cuff Size: Normal)   Pulse 74   Temp 98.1 F (36.7 C) (Oral)   Wt 123 lb 12.8 oz (56.2 kg)   SpO2 97%   BMI 16.79 kg/m  Wt Readings from Last 3 Encounters:  03/20/20 123 lb 12.8 oz (56.2 kg) (6 %, Z= -1.56)*  03/12/20 121 lb (54.9 kg) (4 %, Z= -1.73)*  02/25/20 135 lb (61.2 kg) (18 %, Z= -0.91)*   * Growth percentiles are based on CDC (Boys, 2-20 Years) data.     There are no preventive care reminders to display for this patient.  There are no preventive care reminders to display for this patient.  Lab Results  Component Value Date   TSH 1.08 03/12/2020   Lab Results  Component Value Date   WBC 4.0 02/25/2020   HGB 13.3 02/25/2020   HCT 38.4 (L) 02/25/2020   MCV 83.3 02/25/2020   PLT 155 02/25/2020   Lab Results  Component Value Date   NA 141 03/12/2020   K 3.8 03/12/2020   CO2 21 03/12/2020   GLUCOSE 81  03/12/2020   BUN 9 03/12/2020   CREATININE 1.08 03/12/2020   BILITOT 0.9 03/12/2020   ALKPHOS 120 12/26/2016   AST 21 03/12/2020   ALT 22 03/12/2020   PROT 6.6 03/12/2020   ALBUMIN 4.0 12/26/2016   CALCIUM 9.6 03/12/2020   ANIONGAP 9 02/25/2020   Lab Results  Component Value Date   CHOL 125 03/12/2020   Lab Results  Component Value Date   HDL 49 03/12/2020   Lab Results  Component Value Date   LDLCALC 63 03/12/2020   Lab Results  Component Value Date   TRIG 51 03/12/2020   Lab Results  Component Value Date   CHOLHDL 2.6 03/12/2020   No results found for: HGBA1C    Assessment & Plan:   #1 allergic conjunctivitis left eye.  Resolved with over-the-counter drops.  He has normal eye exam at this time.  Doubt recent bacterial conjunctivitis.  -Reassurance and observation for now.  Follow-up for any recurrent symptoms  -We have recommended he get his Klonopin through [facility] neurology since they have prescribed in the past  No orders of the defined types were placed in this encounter.   Follow-up: No follow-ups on file.    Carolann Littler, MD

## 2020-03-20 NOTE — Patient Instructions (Signed)
Allergic Conjunctivitis, Adult  Allergic conjunctivitis is inflammation of the clear membrane that covers the white part of your eye and the inner surface of your eyelid (conjunctiva). The inflammation is caused by allergies. The blood vessels in the conjunctiva become inflamed and this causes the eyes to become red or pink. The eyes often feel itchy. Allergic conjunctivitis cannot be spread from one person to another person (is not contagious). What are the causes? This condition is caused by an allergic reaction. Common causes of an allergic reaction (allergens) include:  Outdoor allergens, such as: ? Pollen. ? Grass and weeds. ? Mold spores.  Indoor allergens, such as: ? Dust. ? Smoke. ? Mold. ? Pet dander. ? Animal hair. What increases the risk? You may be more likely to develop this condition if you have a family history of allergies, such as:  Allergic rhinitis.  Bronchial asthma.  Atopic dermatitis. What are the signs or symptoms? Symptoms of this condition include eyes that are:  Itchy.  Red.  Watery.  Puffy. Your eyes may also:  Sting or burn.  Have clear drainage coming from them. How is this diagnosed? This condition may be diagnosed by medical history and physical exam. If you have drainage from your eyes, it may be tested to rule out other causes of conjunctivitis. You may also need to see a health care provider who specializes in treating allergies (allergist) or eye conditions (ophthalmologist) for tests to confirm the diagnosis. You may have:  Skin tests to see which allergens are causing your symptoms. These tests involve pricking the skin with a tiny needle and exposing the skin to small amounts of potential allergens to see if your skin reacts.  Blood tests.  Tissue scrapings from your eyelid. These will be examined under a microscope. How is this treated? Treatments for this condition may include:  Cold cloths (compresses) to soothe itching and  swelling.  Washing the face to remove allergens.  Eye drops. These may be prescription or over-the-counter. There are several different types. You may need to try different types to see which one works best for you. Your may need: ? Eye drops that block the allergic reaction (antihistamine). ? Eye drops that reduce swelling and irritation (anti-inflammatory). ? Steroid eye drops to lessen a severe reaction (vernal conjunctivitis).  Oral antihistamine medicines to reduce your allergic reaction. You may need these if eye drops do not help or are difficult to use. Follow these instructions at home:  Avoid known allergens whenever possible.  Take or apply over-the-counter and prescription medicines only as told by your health care provider. These include any eye drops.  Apply a cool, clean washcloth to your eye for 10-20 minutes, 3-4 times a day.  Do not touch or rub your eyes.  Do not wear contact lenses until the inflammation is gone. Wear glasses instead.  Do not wear eye makeup until the inflammation is gone.  Keep all follow-up visits as told by your health care provider. This is important. Contact a health care provider if:  Your symptoms get worse or do not improve with treatment.  You have mild eye pain.  You have sensitivity to light.  You have spots or blisters on your eyes.  You have pus draining from your eye.  You have a fever. Get help right away if:  You have redness, swelling, or other symptoms in only one eye.  Your vision is blurred or you have vision changes.  You have severe eye pain. This information   is not intended to replace advice given to you by your health care provider. Make sure you discuss any questions you have with your health care provider. Document Revised: 06/25/2017 Document Reviewed: 01/24/2016 Elsevier Patient Education  2020 Elsevier Inc.  

## 2020-03-29 ENCOUNTER — Ambulatory Visit: Payer: Commercial Managed Care - PPO | Admitting: Orthopedic Surgery

## 2020-04-15 ENCOUNTER — Ambulatory Visit (INDEPENDENT_AMBULATORY_CARE_PROVIDER_SITE_OTHER): Payer: Commercial Managed Care - PPO | Admitting: Orthopedic Surgery

## 2020-04-15 DIAGNOSIS — S43005D Unspecified dislocation of left shoulder joint, subsequent encounter: Secondary | ICD-10-CM | POA: Diagnosis not present

## 2020-04-18 ENCOUNTER — Encounter: Payer: Self-pay | Admitting: Orthopedic Surgery

## 2020-04-18 NOTE — Progress Notes (Signed)
Office Visit Note   Patient: Terrence Riley           Date of Birth: 2001-03-14           MRN: [id number] Visit Date: 04/15/2020 Requested by: Eulas Post, MD [place],  [place] [address] PCP: Eulas Post, MD  Subjective: Chief Complaint  Patient presents with  . scan review    HPI: Patient presents for evaluation of CT scan of the left shoulder.  Patient reports continued dislocation episodes from the shoulder.  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  CT scan also shows this glenoid deficiency as well as significant reverse Hill-Sachs lesion on the anterior aspect of the humeral head.  Patient reports daily instability which interferes with any activity as well as any type of sporting endeavors.              ROS: All systems reviewed are negative as they relate to the chief complaint within the history of present illness.  Patient denies  fevers or chills.   Assessment & Plan: Visit Diagnoses:  1. 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  Plan is arthroscopic reverse Bankart repair in the lateral position followed by allograft placement into the humeral head defect with headless compression screws with biceps tenodesis and subscap takedown.  The risk and benefits are discussed with the patient include not limited to infection nerve vessel damage stiffness avascular necrosis and potential need for further surgery.  Extensive nature of the rehabilitative process also discussed.  Patient will be in a gunslinger brace after surgery.  All questions answered mother is here with him today for explanation.  Follow-Up Instructions: Return for Return 1  week after surgery.   Orders:  No orders of the defined types were placed in this encounter.  No orders of the defined types were placed in this encounter.     Procedures: No procedures performed   Clinical Data: No additional findings.  Objective: Vital Signs: There were no vitals taken for this visit.  Physical Exam:   Constitutional: Patient appears well-developed HEENT:  Head: Normocephalic Eyes:EOM are normal Neck: Normal range of motion Cardiovascular: Normal rate Pulmonary/chest: Effort normal Neurologic: Patient is alert Skin: Skin is warm Psychiatric: Patient has normal mood and affect    Ortho Exam: Ortho exam demonstrates functional rotator cuff strength with infraspinatus of stress and subscap muscle testing on the left shoulder.  Deltoid is functional.  Motor or sensory function of the hand is intact.  Does have some posterior apprehension.  Less than a centimeter sulcus sign and no anterior apprehension.  Specialty Comments:  No specialty comments available.  Imaging: No results found.   PMFS History: Patient Active Problem List   Diagnosis Date Noted  . Labral tear of shoulder, left, subsequent encounter 02/13/2020  . Shoulder dislocation, left, subsequent encounter 02/13/2020  . Seizure disorder ([place]) 12/22/2019  . Migraines 12/22/2019  . Mild intermittent asthma 12/22/2019   Past Medical History:  Diagnosis Date  . Allergy    rhinitis  . Asthma    as a child  . Depression   . Eosinophilic esophagitis    heartburn and reflux  . Family  history of adverse reaction to anesthesia    mother respiratory failure  . Nasal fracture    deviated septum  . Premature baby    2 months early  . Seizures ([place])    well controlled on meds/ one 2 months ago when meds were late    Family History  Problem Relation Age of Onset  . Cancer Mother        breat  . Protein S deficiency Mother     Past Surgical History:  Procedure Laterality Date  .  ADENOIDECTOMY    . CLOSED REDUCTION NASAL FRACTURE N/A 08/31/2017   Procedure: CLOSED REDUCTION NASAL FRACTURE;  Surgeon: Clyde Canterbury, MD;  Location: [place];  Service: ENT;  Laterality: N/A;  . SEPTOPLASTY N/A 08/31/2017   Procedure: SEPTOPLASTY;  Surgeon: Clyde Canterbury, MD;  Location: [place];  Service: ENT;  Laterality: N/A;  . TONSILLECTOMY     and addenoids   Social History   Occupational History  . Not on file  Tobacco Use  . Smoking status: Never Smoker  . Smokeless tobacco: Never Used  Vaping Use  . Vaping Use: Never used  Substance and Sexual Activity  . Alcohol use: No  . Drug use: No  . Sexual activity: Not on file

## 2020-04-23 ENCOUNTER — Other Ambulatory Visit: Payer: Self-pay

## 2020-04-25 NOTE — Pre-Procedure Instructions (Signed)
Your procedure is scheduled on Tuesday, October 5th at 7:30a.m.  Report to [facility] Main Entrance "A" at 5:30 A.M., and check in at the Admitting office.  Call this number if you have problems the morning of surgery:  [phone]  Call [phone] if you have any questions prior to your surgery date Monday-Friday 8am-4pm    Remember:  Do not eat after midnight the night before your surgery  You may drink clear liquids until 4:30a.m. the morning of your surgery.   Clear liquids allowed are: Water, Non-Citrus Juices (without pulp), Carbonated Beverages, Clear Tea, Black Coffee Only, and Gatorade,   Enhanced Recovery after Surgery for Orthopedics Enhanced Recovery after Surgery is a protocol used to improve the stress on your body and your recovery after surgery.  Patient Instructions  . The night before surgery:  o No food after midnight. ONLY clear liquids after midnight  .  Marland Kitchen The day of surgery (if you do NOT have diabetes):  o Drink ONE (1) Pre-Surgery Clear Ensure by 4:30 am the morning of surgery   o This drink was given to you during your hospital  pre-op appointment visit. o Nothing else to drink after completing the  Pre-Surgery Clear Ensure.         If you have questions, please contact your surgeon's office.     Take these medicines the morning of surgery with A SIP OF WATER   levETIRAcetam (KEPPRA)  methocarbamol (ROBAXIN)  topiramate (TOPAMAX)   AS needed: Albuterol Inhaler-please bring with you on the day of surgery Albuterol Nebulizer cetirizine (ZYRTEC)  clonazePAM (KLONOPIN)  As of today, STOP taking any Aspirin (unless otherwise instructed by your surgeon) Aleve, Naproxen, Ibuprofen, Motrin, Advil, Goody's, BC's, all herbal medications, fish oil, and all vitamins.             Do not wear jewelry            Do not wear lotions, powders, colognes, or deodorant.            Men may shave face and neck.            Do not bring valuables to the  hospital.            [facility] is not responsible for any belongings or valuables.  Do NOT Smoke (Tobacco/Vaping) or drink Alcohol 24 hours prior to your procedure If you use a CPAP at night, you may bring all equipment for your overnight stay.   Contacts, glasses, dentures or bridgework may not be worn into surgery.      For patients admitted to the hospital, discharge time will be determined by your treatment team.   Patients discharged the day of surgery will not be allowed to drive home, and someone needs to stay with them for 24 hours.    Special instructions:   [facility]- Preparing For Surgery  Before surgery, you can play an important role. Because skin is not sterile, your skin needs to be as free of germs as possible. You can reduce the number of germs on your skin by washing with CHG (chlorahexidine gluconate) Soap before surgery.  CHG is an antiseptic cleaner which kills germs and bonds with the skin to continue killing germs even after washing.    Oral Hygiene is also important to reduce your risk of infection.  Remember - BRUSH YOUR TEETH THE MORNING OF SURGERY WITH YOUR REGULAR TOOTHPASTE  Please do not use if you have an allergy to CHG  or antibacterial soaps. If your skin becomes reddened/irritated stop using the CHG.  Do not shave (including legs and underarms) for at least 48 hours prior to first CHG shower. It is OK to shave your face.  Please follow these instructions carefully.   1. Shower the NIGHT BEFORE SURGERY and the MORNING OF SURGERY with CHG Soap.   2. If you chose to wash your hair, wash your hair first as usual with your normal shampoo.  3. After you shampoo, rinse your hair and body thoroughly to remove the shampoo.  4. Use CHG as you would any other liquid soap. You can apply CHG directly to the skin and wash gently with a scrungie or a clean washcloth.   5. Apply the CHG Soap to your body ONLY FROM THE NECK DOWN.  Do not use on open wounds or  open sores. Avoid contact with your eyes, ears, mouth and genitals (private parts). Wash Face and genitals (private parts)  with your normal soap.   6. Wash thoroughly, paying special attention to the area where your surgery will be performed.  7. Thoroughly rinse your body with warm water from the neck down.  8. DO NOT shower/wash with your normal soap after using and rinsing off the CHG Soap.  9. Pat yourself dry with a CLEAN TOWEL.  10. Wear CLEAN PAJAMAS to bed the night before surgery  11. Place CLEAN SHEETS on your bed the night of your first shower and DO NOT SLEEP WITH PETS.   Day of Surgery: Wear Clean/Comfortable clothing the morning of surgery Do not apply any deodorants/lotions.   Remember to brush your teeth WITH YOUR REGULAR TOOTHPASTE.   Please read over the following fact sheets that you were given.

## 2020-04-26 ENCOUNTER — Inpatient Hospital Stay (HOSPITAL_COMMUNITY)
Admission: RE | Admit: 2020-04-26 | Discharge: 2020-04-26 | Disposition: A | Discharge status: Home / Self Care | Payer: Commercial Managed Care - PPO | Source: Ambulatory Visit

## 2020-04-26 ENCOUNTER — Other Ambulatory Visit (HOSPITAL_COMMUNITY): Payer: Commercial Managed Care - PPO

## 2020-04-29 ENCOUNTER — Other Ambulatory Visit: Payer: Self-pay

## 2020-04-29 ENCOUNTER — Encounter (HOSPITAL_COMMUNITY)
Admission: RE | Admit: 2020-04-29 | Discharge: 2020-04-29 | Disposition: A | Discharge status: Home / Self Care | Payer: Commercial Managed Care - PPO | Source: Ambulatory Visit | Attending: Orthopedic Surgery | Admitting: Orthopedic Surgery

## 2020-04-29 ENCOUNTER — Other Ambulatory Visit (HOSPITAL_COMMUNITY)
Admission: RE | Admit: 2020-04-29 | Discharge: 2020-04-29 | Disposition: A | Discharge status: Home / Self Care | Payer: Commercial Managed Care - PPO | Source: Ambulatory Visit | Attending: Orthopedic Surgery | Admitting: Orthopedic Surgery

## 2020-04-29 ENCOUNTER — Encounter (HOSPITAL_COMMUNITY): Payer: Self-pay

## 2020-04-29 DIAGNOSIS — Z01812 Encounter for preprocedural laboratory examination: Secondary | ICD-10-CM | POA: Insufficient documentation

## 2020-04-29 DIAGNOSIS — Z20822 Contact with and (suspected) exposure to covid-19: Secondary | ICD-10-CM | POA: Insufficient documentation

## 2020-04-29 HISTORY — DX: Pneumonia, unspecified organism: J18.9

## 2020-04-29 HISTORY — DX: Other complications of anesthesia, initial encounter: T88.59XA

## 2020-04-29 LAB — BASIC METABOLIC PANEL
Anion gap: 9 (ref 5–15)
BUN: 9 mg/dL (ref 6–20)
CO2: 22 mmol/L (ref 22–32)
Calcium: 9.4 mg/dL (ref 8.9–10.3)
Chloride: 106 mmol/L (ref 98–111)
Creatinine, Ser: 1.11 mg/dL (ref 0.61–1.24)
GFR calc Af Amer: >60 mL/min (ref >60)
GFR calc non Af Amer: >60 mL/min (ref >60)
Glucose, Bld: 94 mg/dL (ref 70–99)
Potassium: 3.7 mmol/L (ref 3.5–5.1)
Sodium: 137 mmol/L (ref 135–145)

## 2020-04-29 LAB — CBC
HCT: 43.2 % (ref 39.0–52.0)
Hemoglobin: 14.1 g/dL (ref 13.0–17.0)
MCH: 28.4 pg (ref 26.0–34.0)
MCHC: 32.6 g/dL (ref 30.0–36.0)
MCV: 86.9 fL (ref 80.0–100.0)
Platelets: 158 10*3/uL (ref 150–400)
RBC: 4.97 MIL/uL (ref 4.22–5.81)
RDW: 12.4 % (ref 11.5–15.5)
WBC: 4.3 10*3/uL (ref 4.0–10.5)
nRBC: 0 % (ref 0.0–0.2)

## 2020-04-29 LAB — SARS CORONAVIRUS 2 (TAT 6-24 HRS): SARS Coronavirus 2: NEGATIVE

## 2020-04-29 NOTE — Progress Notes (Addendum)
PCP - [place] Cardiologist - na Neurologist: alexander @ [facility]   Chest x-ray - na EKG - 09/11/19 Stress Test - na ECHO - na Cardiac Cath - na  Sleep Study - na  Last seizure > 1 yr. ago   Fasting Blood Sugar - na   Blood Thinner Instructions: na Aspirin Instructions: na  ERAS Protcol -yes PRE-SURGERY water given, pt. Stated he's lactose intolerant. Call pharmacy , they stated not to give ensure to pt.   COVID TEST- 04/29/20   Anesthesia review:   Patient denies shortness of breath, fever, cough and chest pain at PAT appointment   All instructions explained to the patient, with a verbal understanding of the material. Patient agrees to go over the instructions while at home for a better understanding. Patient also instructed to self quarantine after being tested for COVID-19. The opportunity to ask questions was provided.

## 2020-04-29 NOTE — Anesthesia Preprocedure Evaluation (Addendum)
Anesthesia Evaluation  Patient identified by MRN, date of birth, ID band Patient awake    Reviewed: Allergy & Precautions, NPO status , Patient's Chart, lab work & pertinent test results  Airway Mallampati: III  TM Distance: >3 FB Neck ROM: Full    Dental  (+) Chipped,    Pulmonary asthma ,    Pulmonary exam normal breath sounds clear to auscultation       Cardiovascular negative cardio ROS Normal cardiovascular exam Rhythm:Regular Rate:Normal     Neuro/Psych  Headaches, Seizures -, Well Controlled,  negative psych ROS   GI/Hepatic negative GI ROS, Neg liver ROS,   Endo/Other  negative endocrine ROS  Renal/GU negative Renal ROS     Musculoskeletal negative musculoskeletal ROS (+)   Abdominal   Peds  (+) premature delivery Hematology negative hematology ROS (+)   Anesthesia Other Findings left shoulder posterior instability  Reproductive/Obstetrics                            Anesthesia Physical Anesthesia Plan  ASA: II  Anesthesia Plan: General and Regional   Post-op Pain Management: GA combined w/ Regional for post-op pain   Induction: Intravenous  PONV Risk Score and Plan: 2 and Ondansetron, Dexamethasone, Midazolam and Treatment may vary due to age or medical condition  Airway Management Planned: Oral ETT  Additional Equipment:   Intra-op Plan:   Post-operative Plan: Extubation in OR  Informed Consent: I have reviewed the patients History and Physical, chart, labs and discussed the procedure including the risks, benefits and alternatives for the proposed anesthesia with the patient or authorized representative who has indicated his/her understanding and acceptance.     Dental advisory given  Plan Discussed with: CRNA  Anesthesia Plan Comments:        Anesthesia Quick Evaluation

## 2020-04-30 ENCOUNTER — Encounter (HOSPITAL_COMMUNITY)
Admission: RE | Disposition: A | Discharge status: Home / Self Care | Payer: Self-pay | Source: Home / Self Care | Attending: Orthopedic Surgery

## 2020-04-30 ENCOUNTER — Encounter (HOSPITAL_COMMUNITY): Payer: Self-pay | Admitting: Orthopedic Surgery

## 2020-04-30 ENCOUNTER — Ambulatory Visit (HOSPITAL_COMMUNITY): Payer: Commercial Managed Care - PPO | Admitting: Certified Registered"

## 2020-04-30 ENCOUNTER — Ambulatory Visit (HOSPITAL_COMMUNITY)
Admission: RE | Admit: 2020-04-30 | Discharge: 2020-04-30 | Disposition: A | Discharge status: Home / Self Care | Payer: Commercial Managed Care - PPO | Attending: Orthopedic Surgery | Admitting: Orthopedic Surgery

## 2020-04-30 DIAGNOSIS — Z885 Allergy status to narcotic agent status: Secondary | ICD-10-CM | POA: Diagnosis not present

## 2020-04-30 DIAGNOSIS — Z9109 Other allergy status, other than to drugs and biological substances: Secondary | ICD-10-CM | POA: Diagnosis not present

## 2020-04-30 DIAGNOSIS — Z886 Allergy status to analgesic agent status: Secondary | ICD-10-CM | POA: Insufficient documentation

## 2020-04-30 DIAGNOSIS — Z91018 Allergy to other foods: Secondary | ICD-10-CM | POA: Insufficient documentation

## 2020-04-30 DIAGNOSIS — J45909 Unspecified asthma, uncomplicated: Secondary | ICD-10-CM | POA: Insufficient documentation

## 2020-04-30 DIAGNOSIS — Z888 Allergy status to other drugs, medicaments and biological substances status: Secondary | ICD-10-CM | POA: Insufficient documentation

## 2020-04-30 DIAGNOSIS — S43022S Posterior subluxation of left humerus, sequela: Secondary | ICD-10-CM | POA: Diagnosis not present

## 2020-04-30 DIAGNOSIS — S43432A Superior glenoid labrum lesion of left shoulder, initial encounter: Secondary | ICD-10-CM | POA: Diagnosis not present

## 2020-04-30 DIAGNOSIS — M25312 Other instability, left shoulder: Secondary | ICD-10-CM | POA: Insufficient documentation

## 2020-04-30 DIAGNOSIS — Z91012 Allergy to eggs: Secondary | ICD-10-CM | POA: Diagnosis not present

## 2020-04-30 DIAGNOSIS — S43022A Posterior subluxation of left humerus, initial encounter: Secondary | ICD-10-CM

## 2020-04-30 DIAGNOSIS — R519 Headache, unspecified: Secondary | ICD-10-CM | POA: Diagnosis not present

## 2020-04-30 DIAGNOSIS — Z791 Long term (current) use of non-steroidal anti-inflammatories (NSAID): Secondary | ICD-10-CM | POA: Insufficient documentation

## 2020-04-30 DIAGNOSIS — K219 Gastro-esophageal reflux disease without esophagitis: Secondary | ICD-10-CM | POA: Insufficient documentation

## 2020-04-30 DIAGNOSIS — R569 Unspecified convulsions: Secondary | ICD-10-CM | POA: Insufficient documentation

## 2020-04-30 DIAGNOSIS — S42252A Displaced fracture of greater tuberosity of left humerus, initial encounter for closed fracture: Secondary | ICD-10-CM | POA: Diagnosis not present

## 2020-04-30 DIAGNOSIS — Z887 Allergy status to serum and vaccine status: Secondary | ICD-10-CM | POA: Diagnosis not present

## 2020-04-30 DIAGNOSIS — Z79899 Other long term (current) drug therapy: Secondary | ICD-10-CM | POA: Insufficient documentation

## 2020-04-30 HISTORY — PX: SHOULDER ARTHROSCOPY WITH LABRAL REPAIR: SHX5691

## 2020-04-30 SURGERY — ARTHROSCOPY, SHOULDER, WITH GLENOID LABRUM REPAIR
Anesthesia: Regional | Site: Shoulder | Laterality: Left

## 2020-04-30 MED ORDER — EPHEDRINE 5 MG/ML INJ
INTRAVENOUS | Status: AC
  Filled 2020-04-30: qty 10

## 2020-04-30 MED ORDER — MIDAZOLAM HCL 2 MG/2ML IJ SOLN
INTRAMUSCULAR | Status: AC
  Filled 2020-04-30: qty 2

## 2020-04-30 MED ORDER — POVIDONE-IODINE 7.5 % EX SOLN
Freq: Once | CUTANEOUS | Status: DC
  Filled 2020-04-30: qty 118

## 2020-04-30 MED ORDER — ROCURONIUM BROMIDE 100 MG/10ML IV SOLN
INTRAVENOUS | Status: DC | PRN
  Administered 2020-04-30: 60 mg via INTRAVENOUS
  Administered 2020-04-30 (×2): 20 mg via INTRAVENOUS

## 2020-04-30 MED ORDER — CLONIDINE HCL (ANALGESIA) 100 MCG/ML EP SOLN
EPIDURAL | Status: AC
  Filled 2020-04-30: qty 10

## 2020-04-30 MED ORDER — SODIUM CHLORIDE 0.9 % IR SOLN
Status: DC | PRN
  Administered 2020-04-30 (×5): 3000 mL

## 2020-04-30 MED ORDER — EPINEPHRINE PF 1 MG/ML IJ SOLN
INTRAMUSCULAR | Status: AC
  Filled 2020-04-30: qty 5

## 2020-04-30 MED ORDER — PROPOFOL 10 MG/ML IV BOLUS
INTRAVENOUS | Status: AC
  Filled 2020-04-30: qty 20

## 2020-04-30 MED ORDER — SUGAMMADEX SODIUM 200 MG/2ML IV SOLN
INTRAVENOUS | Status: DC | PRN
  Administered 2020-04-30: 125 mg via INTRAVENOUS

## 2020-04-30 MED ORDER — PHENYLEPHRINE 40 MCG/ML (10ML) SYRINGE FOR IV PUSH (FOR BLOOD PRESSURE SUPPORT)
PREFILLED_SYRINGE | INTRAVENOUS | Status: AC
  Filled 2020-04-30: qty 10

## 2020-04-30 MED ORDER — ONDANSETRON HCL 4 MG/2ML IJ SOLN
INTRAMUSCULAR | Status: AC
  Filled 2020-04-30: qty 2

## 2020-04-30 MED ORDER — FENTANYL CITRATE (PF) 100 MCG/2ML IJ SOLN: 25.0000 ug | INTRAMUSCULAR | Status: DC | PRN

## 2020-04-30 MED ORDER — EPHEDRINE SULFATE-NACL 50-0.9 MG/10ML-% IV SOSY
PREFILLED_SYRINGE | INTRAVENOUS | Status: DC | PRN
  Administered 2020-04-30: 5 mg via INTRAVENOUS
  Administered 2020-04-30: 10 mg via INTRAVENOUS
  Administered 2020-04-30 (×3): 5 mg via INTRAVENOUS

## 2020-04-30 MED ORDER — CEFAZOLIN SODIUM-DEXTROSE 2-4 GM/100ML-% IV SOLN
2.0000 g | INTRAVENOUS | Status: AC
  Administered 2020-04-30 (×2): 2 g via INTRAVENOUS
  Filled 2020-04-30: qty 100

## 2020-04-30 MED ORDER — MIDAZOLAM HCL 5 MG/5ML IJ SOLN
INTRAMUSCULAR | Status: DC | PRN
  Administered 2020-04-30: 2 mg via INTRAVENOUS

## 2020-04-30 MED ORDER — ORAL CARE MOUTH RINSE: 15.0000 mL | Freq: Once | OROMUCOSAL | Status: AC

## 2020-04-30 MED ORDER — KETOROLAC TROMETHAMINE 30 MG/ML IJ SOLN: 30.0000 mg | Freq: Once | INTRAMUSCULAR | Status: DC

## 2020-04-30 MED ORDER — PROPOFOL 10 MG/ML IV BOLUS
INTRAVENOUS | Status: DC | PRN
  Administered 2020-04-30 (×2): 100 mg via INTRAVENOUS

## 2020-04-30 MED ORDER — POVIDONE-IODINE 10 % EX SWAB
2.0000 "application " | Freq: Once | CUTANEOUS | Status: AC
  Administered 2020-04-30: 2 via TOPICAL

## 2020-04-30 MED ORDER — CHLORHEXIDINE GLUCONATE 0.12 % MT SOLN
15.0000 mL | Freq: Once | OROMUCOSAL | Status: AC
  Administered 2020-04-30: 15 mL via OROMUCOSAL
  Filled 2020-04-30: qty 15

## 2020-04-30 MED ORDER — CEFAZOLIN SODIUM 1 G IJ SOLR
INTRAMUSCULAR | Status: AC
  Filled 2020-04-30: qty 20

## 2020-04-30 MED ORDER — BUPIVACAINE LIPOSOME 1.3 % IJ SUSP
INTRAMUSCULAR | Status: DC | PRN
  Administered 2020-04-30: 10 mL via PERINEURAL

## 2020-04-30 MED ORDER — VANCOMYCIN HCL 1000 MG IV SOLR
INTRAVENOUS | Status: DC | PRN
  Administered 2020-04-30: 1000 mg

## 2020-04-30 MED ORDER — OXYCODONE HCL 5 MG PO TABS: 5.0000 mg | ORAL_TABLET | Freq: Four times a day (QID) | ORAL | 0 refills | Status: DC | PRN

## 2020-04-30 MED ORDER — DEXAMETHASONE SODIUM PHOSPHATE 10 MG/ML IJ SOLN
INTRAMUSCULAR | Status: AC
  Filled 2020-04-30: qty 1

## 2020-04-30 MED ORDER — MORPHINE SULFATE (PF) 4 MG/ML IV SOLN
INTRAVENOUS | Status: AC
  Filled 2020-04-30: qty 2

## 2020-04-30 MED ORDER — DIPHENHYDRAMINE HCL 50 MG/ML IJ SOLN
INTRAMUSCULAR | Status: AC
  Filled 2020-04-30: qty 1

## 2020-04-30 MED ORDER — PHENYLEPHRINE 40 MCG/ML (10ML) SYRINGE FOR IV PUSH (FOR BLOOD PRESSURE SUPPORT)
PREFILLED_SYRINGE | INTRAVENOUS | Status: DC | PRN
  Administered 2020-04-30 (×6): 40 ug via INTRAVENOUS
  Administered 2020-04-30: 80 ug via INTRAVENOUS
  Administered 2020-04-30 (×4): 40 ug via INTRAVENOUS

## 2020-04-30 MED ORDER — BUPIVACAINE HCL (PF) 0.25 % IJ SOLN
INTRAMUSCULAR | Status: AC
  Filled 2020-04-30: qty 30

## 2020-04-30 MED ORDER — VANCOMYCIN HCL 1000 MG IV SOLR
INTRAVENOUS | Status: AC
  Filled 2020-04-30: qty 1000

## 2020-04-30 MED ORDER — ACETAMINOPHEN 10 MG/ML IV SOLN: 1000.0000 mg | Freq: Once | INTRAVENOUS | Status: DC | PRN

## 2020-04-30 MED ORDER — BUPIVACAINE HCL (PF) 0.5 % IJ SOLN
INTRAMUSCULAR | Status: DC | PRN
  Administered 2020-04-30: 15 mL via PERINEURAL

## 2020-04-30 MED ORDER — EPINEPHRINE PF 1 MG/ML IJ SOLN
INTRAMUSCULAR | Status: DC | PRN
  Administered 2020-04-30 (×3): 1 mg via SUBCUTANEOUS

## 2020-04-30 MED ORDER — FENTANYL CITRATE (PF) 250 MCG/5ML IJ SOLN
INTRAMUSCULAR | Status: AC
  Filled 2020-04-30: qty 5

## 2020-04-30 MED ORDER — LIDOCAINE 2% (20 MG/ML) 5 ML SYRINGE
INTRAMUSCULAR | Status: DC | PRN
  Administered 2020-04-30: 40 mg via INTRAVENOUS

## 2020-04-30 MED ORDER — FENTANYL CITRATE (PF) 100 MCG/2ML IJ SOLN
INTRAMUSCULAR | Status: DC | PRN
Start: 2020-04-30 — End: 2020-04-30
  Administered 2020-04-30 (×2): 50 ug via INTRAVENOUS
  Administered 2020-04-30: 25 ug via INTRAVENOUS

## 2020-04-30 MED ORDER — DEXAMETHASONE SODIUM PHOSPHATE 10 MG/ML IJ SOLN
INTRAMUSCULAR | Status: DC | PRN
  Administered 2020-04-30: 10 mg via INTRAVENOUS

## 2020-04-30 MED ORDER — LACTATED RINGERS IV SOLN: INTRAVENOUS | Status: DC

## 2020-04-30 MED ORDER — DIPHENHYDRAMINE HCL 50 MG/ML IJ SOLN
INTRAMUSCULAR | Status: DC | PRN
  Administered 2020-04-30: 12.5 mg via INTRAVENOUS

## 2020-04-30 MED ORDER — PROMETHAZINE HCL 25 MG/ML IJ SOLN: 6.2500 mg | INTRAMUSCULAR | Status: DC | PRN

## 2020-04-30 MED ORDER — PROPOFOL 500 MG/50ML IV EMUL
INTRAVENOUS | Status: DC | PRN
  Administered 2020-04-30: 25 ug/kg/min via INTRAVENOUS

## 2020-04-30 SURGICAL SUPPLY — 94 items
ANCH SUT 2 SWLK 19.1 CLS EYLT (Anchor) ×2 IMPLANT
ANCH SUT FBRTK 1.3 2 TPE (Anchor) ×2 IMPLANT
ANCHOR FBRTK 2.6 SUTURETAP 1.3 (Anchor) ×2 IMPLANT
ANCHOR SUT 1.8 FBRTK KNTLS 2SU (Anchor) ×7 IMPLANT
ANCHOR SWIVELOCK BIO 4.75X19.1 (Anchor) ×2 IMPLANT
BIT DRILL 3 CANN STRGHT (BIT) ×2
BIT DRILL 3.2 CANN STRGHT (BIT) ×2 IMPLANT
BIT DRILL CANN STRT 4.0 (BIT) ×1 IMPLANT
BLADE CUTTER GATOR 3.5 (BLADE) IMPLANT
BLADE EXCALIBUR 4.0X13 (MISCELLANEOUS) IMPLANT
BLADE LONG MED 31X9 (MISCELLANEOUS) ×1 IMPLANT
BLADE SHAVER TORPEDO 4X13 (MISCELLANEOUS) ×1 IMPLANT
BLADE SURG 11 STRL SS (BLADE) IMPLANT
BUR SABER RD CUTTING 3.0 (BURR) ×1 IMPLANT
CANNULA 5.75X71 LONG (CANNULA) ×2 IMPLANT
CANNULA TWIST IN 8.25X7CM (CANNULA) ×3 IMPLANT
CONNECTOR 5 IN 1 STRAIGHT STRL (MISCELLANEOUS) ×1 IMPLANT
COVER SURGICAL LIGHT HANDLE (MISCELLANEOUS) ×2 IMPLANT
DRAPE HALF SHEET 40X57 (DRAPES) ×1 IMPLANT
DRAPE INCISE IOBAN 66X45 STRL (DRAPES) ×2 IMPLANT
DRAPE STERI 35X30 U-POUCH (DRAPES) ×4 IMPLANT
DRILL COUNTERSINK CANN 3 (BIT) IMPLANT
DRIVER SHAFT CANN MICRO (MISCELLANEOUS) ×1 IMPLANT
DRSG AQUACEL AG ADV 3.5X 6 (GAUZE/BANDAGES/DRESSINGS) ×2 IMPLANT
DRSG AQUACEL AG ADV 3.5X10 (GAUZE/BANDAGES/DRESSINGS) ×1 IMPLANT
DRSG PAD ABDOMINAL 8X10 ST (GAUZE/BANDAGES/DRESSINGS) ×1 IMPLANT
DRSG TEGADERM 4X4.75 (GAUZE/BANDAGES/DRESSINGS) IMPLANT
DRSG XEROFORM 1X8 (GAUZE/BANDAGES/DRESSINGS) ×1 IMPLANT
DURAPREP 26ML APPLICATOR (WOUND CARE) ×2 IMPLANT
DW OUTFLOW CASSETTE/TUBE SET (MISCELLANEOUS) ×1 IMPLANT
ELECT REM PT RETURN 9FT ADLT (ELECTROSURGICAL) ×2
ELECTRODE REM PT RTRN 9FT ADLT (ELECTROSURGICAL) ×1 IMPLANT
FIBERSTICK 2 (SUTURE) IMPLANT
GAUZE SPONGE 4X4 12PLY STRL (GAUZE/BANDAGES/DRESSINGS) ×2 IMPLANT
GAUZE XEROFORM 1X8 LF (GAUZE/BANDAGES/DRESSINGS) ×2 IMPLANT
GLOVE BIOGEL PI IND STRL 8 (GLOVE) ×1 IMPLANT
GLOVE BIOGEL PI INDICATOR 8 (GLOVE) ×1
GLOVE ECLIPSE 8.0 STRL XLNG CF (GLOVE) ×2 IMPLANT
GOWN STRL REUS W/ TWL LRG LVL3 (GOWN DISPOSABLE) ×3 IMPLANT
GOWN STRL REUS W/TWL LRG LVL3 (GOWN DISPOSABLE) ×6
GRAFT BNE HEAD HUM >43 (Tissue) IMPLANT
GUIDEWIRE .045XTROC TIP LSR LN (WIRE) IMPLANT
GUIDEWIRE 0.86MM (WIRE) ×1 IMPLANT
K-WIRE 1.1 (WIRE) ×6
KIT BASIN OR (CUSTOM PROCEDURE TRAY) ×2 IMPLANT
KIT PERC INSERT 3.0 KNTLS (KITS) ×1 IMPLANT
KIT STR SPEAR 1.8 FBRTK DISP (KITS) ×2 IMPLANT
KIT TURNOVER KIT B (KITS) ×2 IMPLANT
LASSO 90 CVE QUICKPAS (DISPOSABLE) ×2 IMPLANT
LASSO CRESCENT QUICKPASS (SUTURE) ×1 IMPLANT
MANIFOLD NEPTUNE II (INSTRUMENTS) ×2 IMPLANT
MARKER SKIN DUAL TIP RULER LAB (MISCELLANEOUS) ×1 IMPLANT
NDL 18GX1X1/2 (RX/OR ONLY) (NEEDLE) IMPLANT
NDL SPNL 18GX3.5 QUINCKE PK (NEEDLE) ×1 IMPLANT
NDL TAPERED W/ NITINOL LOOP (MISCELLANEOUS) IMPLANT
NEEDLE 18GX1X1/2 (RX/OR ONLY) (NEEDLE) ×2 IMPLANT
NEEDLE FILTER BLUNT 18X 1/2SAF (NEEDLE) ×1
NEEDLE FILTER BLUNT 18X1 1/2 (NEEDLE) ×1 IMPLANT
NEEDLE SPNL 18GX3.5 QUINCKE PK (NEEDLE) ×4 IMPLANT
NEEDLE SUT 2-0 SCORPION KNEE (NEEDLE) ×2 IMPLANT
NEEDLE TAPERED W/ NITINOL LOOP (MISCELLANEOUS) ×2 IMPLANT
NS IRRIG 1000ML POUR BTL (IV SOLUTION) ×8 IMPLANT
PACK SHOULDER (CUSTOM PROCEDURE TRAY) ×3 IMPLANT
PAD ARMBOARD 7.5X6 YLW CONV (MISCELLANEOUS) ×4 IMPLANT
PORT APPOLLO RF 90DEGREE MULTI (SURGICAL WAND) ×2 IMPLANT
SCREW COMP 4.0X30 (Screw) ×1 IMPLANT
SCREW COMPRESSION 2.5X28 (Screw) ×1 IMPLANT
SLEEVE ARM SUSPENSION SYSTEM (MISCELLANEOUS) ×1 IMPLANT
SLING S3 LATERAL DISP (MISCELLANEOUS) ×1 IMPLANT
SPONGE LAP 4X18 RFD (DISPOSABLE) ×2 IMPLANT
STRIP CLOSURE SKIN 1/2X4 (GAUZE/BANDAGES/DRESSINGS) ×1 IMPLANT
SUT ETHILON 3 0 PS 1 (SUTURE) ×4 IMPLANT
SUT FIBERWIRE 2-0 18 17.9 3/8 (SUTURE)
SUT MNCRL+ AB 3-0 CT1 36 (SUTURE) IMPLANT
SUT MONOCRYL AB 3-0 CT1 36IN (SUTURE) ×2
SUT VIC AB 0 CT1 27 (SUTURE) ×4
SUT VIC AB 0 CT1 27XBRD ANBCTR (SUTURE) IMPLANT
SUT VIC AB 2-0 CT1 27 (SUTURE) ×4
SUT VIC AB 2-0 CT1 TAPERPNT 27 (SUTURE) ×2 IMPLANT
SUT VIC AB 2-0 UR6 27 (SUTURE) ×3 IMPLANT
SUT VIC AB 3-0 X1 27 (SUTURE) ×2 IMPLANT
SUT VICRYL 0 UR6 27IN ABS (SUTURE) ×2 IMPLANT
SUTURE FIBERWR 2-0 18 17.9 3/8 (SUTURE) IMPLANT
SYR 20ML LL LF (SYRINGE) ×2 IMPLANT
SYR 30ML LL (SYRINGE) ×2 IMPLANT
SYR BULB IRRIG 60ML STRL (SYRINGE) ×1 IMPLANT
TAPE LABRALWHITE 1.5X36 (TAPE) IMPLANT
TAPE SUT LABRALTAP WHT/BLK (SUTURE) IMPLANT
TISSUE GRFT HUMERAL HD TO 43MM (Tissue) ×2 IMPLANT
TOWEL GREEN STERILE (TOWEL DISPOSABLE) ×2 IMPLANT
TOWEL GREEN STERILE FF (TOWEL DISPOSABLE) ×2 IMPLANT
TUBING ARTHROSCOPY IRRIG 16FT (MISCELLANEOUS) ×2 IMPLANT
WAND STAR VAC 90 (SURGICAL WAND) ×1 IMPLANT
WATER STERILE IRR 1000ML POUR (IV SOLUTION) ×2 IMPLANT

## 2020-04-30 NOTE — Anesthesia Procedure Notes (Signed)
Procedure Name: Intubation Date/Time: 04/30/2020 7:53 AM Performed by: Gwyndolyn Saxon, CRNA Pre-anesthesia Checklist: Patient identified, Emergency Drugs available, Suction available and Patient being monitored Patient Re-evaluated:Patient Re-evaluated prior to induction Oxygen Delivery Method: Circle system utilized Preoxygenation: Pre-oxygenation with 100% oxygen Induction Type: IV induction Ventilation: Mask ventilation without difficulty Laryngoscope Size: Miller and 2 Grade View: Grade I Tube type: Oral Tube size: 7.5 mm Number of attempts: 1 Airway Equipment and Method: Stylet Placement Confirmation: ETT inserted through vocal cords under direct vision,  positive ETCO2 and breath sounds checked- equal and bilateral Secured at: 22 cm Tube secured with: Tape Dental Injury: Teeth and Oropharynx as per pre-operative assessment

## 2020-04-30 NOTE — H&P (Signed)
Terrence Riley is an 19 y.o. male.   Chief Complaint: Left shoulder instability HPI: Patient presents with several month history of left posterior shoulder instability.  Had initial injury several months ago.  Has had daily symptomatic instability since that time.  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  He also has reverse Hill-Sachs lesion which is likely creating symptomatic posterior instability.  Presents now for operative management.  Patient does report multiple dislocation episodes on a daily basis.  Past Medical History:  Diagnosis Date  . Allergy    rhinitis  . Asthma    as a child  . Complication of anesthesia    pt. reports he need more anesthesia with the septoplasty surgery  . Eosinophilic esophagitis    heartburn and reflux  . Family history of adverse reaction to anesthesia    mother respiratory failure  . Nasal fracture    deviated septum  . Pneumonia    1x history of  . Premature baby    2 months early  . Seizures ([place])    well controlled on meds/ one 2 months ago when meds were late    Past Surgical History:  Procedure Laterality Date  . ADENOIDECTOMY    . CLOSED REDUCTION NASAL FRACTURE N/A 08/31/2017   Procedure: CLOSED REDUCTION NASAL FRACTURE;  Surgeon: Clyde Canterbury, MD;  Location: [place];  Service: ENT;  Laterality: N/A;  . SEPTOPLASTY N/A 08/31/2017   Procedure: SEPTOPLASTY;  Surgeon: Clyde Canterbury, MD;  Location: [place];  Service: ENT;  Laterality: N/A;  . TONSILLECTOMY     and addenoids    Family History  Problem Relation Age of Onset  . Cancer Mother        breat  . Protein S deficiency Mother    Social History:  reports that he has never smoked. He has never used smokeless tobacco. He reports that he does not drink alcohol and does not use drugs.  Allergies:  Allergies  Allergen Reactions  . Lortab [Hydrocodone-Acetaminophen] Hives   . Other Anaphylaxis    Peanuts  . Zofran [Ondansetron Hcl] Nausea And Vomiting  . Citrus Other (See Comments)    Sores in mouth  . Dairy Aid [Lactase] Nausea And Vomiting  . Flu Virus Vaccine Other (See Comments)  . Soy Allergy Other (See Comments)    Seizures   . Tylenol [Acetaminophen]     Can cause a fever and seizure activity   . Eggs Or Egg-Derived Products Rash    Pt mother reports pt only avoids eggs alone but can tolerate as an ingredient. [facility] 07/27/13  . Hydrocodone-Acetaminophen Rash    Medications Prior to Admission  Medication Sig Dispense Refill  . cetirizine (ZYRTEC) 10 MG tablet Take 10 mg by mouth daily as needed for allergies.     . clonazePAM (KLONOPIN) 1 MG tablet Take 0.5 mg by mouth 2 (two) times daily as needed for anxiety.     Marland Kitchen EPINEPHrine 0.3 mg/0.3 mL IJ SOAJ injection Inject 0.3 mg into the muscle once as needed for anaphylaxis.     Marland Kitchen levETIRAcetam (KEPPRA) 1000 MG tablet Take 2,000 mg by mouth 2 (two) times daily.     Marland Kitchen lidocaine (LIDODERM) 5 % Place 1 patch onto the skin daily. Remove & Discard patch within 12 hours or as directed by MD (Patient taking differently: Place 1 patch onto the skin daily as needed (pain). Remove &  Discard patch within 12 hours or as directed by MD) 30 patch 0  . topiramate (TOPAMAX) 200 MG tablet Take 200 mg by mouth 2 (two) times daily.     Marland Kitchen albuterol (PROVENTIL) (2.5 MG/3ML) 0.083% nebulizer solution Take 3 mLs (2.5 mg total) by nebulization every 4 (four) hours as needed for wheezing or shortness of breath. 30 vial 0  . albuterol (VENTOLIN HFA) 108 (90 Base) MCG/ACT inhaler Inhale 2 puffs into the lungs every 4 (four) hours as needed for wheezing. 18 g 1  . Cholecalciferol (VITAMIN D-1000 MAX ST) 25 MCG (1000 UT) tablet Take 1,000 Units by mouth daily.     . methocarbamol (ROBAXIN) 500 MG tablet Take 1 tablet (500 mg total) by mouth 2 (two) times daily. 20 tablet 0  . midazolam (VERSED) 5 MG/ML injection Place 2 mLs into the  nose every 5 (five) minutes as needed (seizures). Draw up prescribed dose (ml) in syringe, remove blue vial access device, then attach syringe to nasal atomizer for intranasal administration.     . naproxen (NAPROSYN) 500 MG tablet Take 1 tablet (500 mg total) by mouth 2 (two) times daily. 30 tablet 0  . PRESCRIPTION MEDICATION Apply 1 application topically 3 (three) times daily as needed. Nausea - Phenergan Cream- (Patient not taking: Reported on 04/22/2020)      Results for orders placed or performed during the hospital encounter of 04/29/20 (from the past 48 hour(s))  SARS CORONAVIRUS 2 (TAT 6-24 HRS) Nasopharyngeal Nasopharyngeal Swab     Status: None   Collection Time: 04/29/20 10:38 AM   Specimen: Nasopharyngeal Swab  Result Value Ref Range   SARS Coronavirus 2 NEGATIVE NEGATIVE    Comment: (NOTE) SARS-CoV-2 target nucleic acids are NOT DETECTED.  The SARS-CoV-2 RNA is generally detectable in upper and lower respiratory specimens during the acute phase of infection. Negative results do not preclude SARS-CoV-2 infection, do not rule out co-infections with other pathogens, and should not be used as the sole basis for treatment or other patient management decisions. Negative results must be combined with clinical observations, patient history, and epidemiological information. The expected result is Negative.  Fact Sheet for Patients: SugarRoll.be  Fact Sheet for Healthcare Providers: https://www.woods-mathews.com/  This test is not yet approved or cleared by the [place] FDA and  has been authorized for detection and/or diagnosis of SARS-CoV-2 by FDA under an Emergency Use Authorization (EUA). This EUA will remain  in effect (meaning this test can be used) for the duration of the COVID-19 declaration under Se ction 564(b)(1) of the Act, 21 U.S.C. section 360bbb-3(b)(1), unless the authorization is terminated or revoked  sooner.  Performed at [facility], [place] [address], [place], [place] [address]    No results found.  Review of Systems  Musculoskeletal: Positive for arthralgias.  All other systems reviewed and are negative.   Blood pressure 130/83, pulse (!) 56, temperature 97.8 F (36.6 C), temperature source Oral, resp. rate 17, SpO2 100 %. Physical Exam Vitals reviewed.  HENT:     Head: Normocephalic.     Nose: Nose normal.     Mouth/Throat:     Mouth: Mucous membranes are moist.  Cardiovascular:     Rate and Rhythm: Normal rate.     Pulses: Normal pulses.  Pulmonary:     Effort: Pulmonary effort is normal.  Abdominal:     General: Abdomen is flat.  Musculoskeletal:     Cervical back: Normal range of motion.  Skin:  General: Skin is warm.     Capillary Refill: Capillary refill takes less than 2 seconds.  Neurological:     General: No focal deficit present.     Mental Status: He is alert.  Psychiatric:        Mood and Affect: Mood normal.     Examination of the left shoulder demonstrates functional deltoid with good rotator cuff strength.  Does have posterior laxity but no anterior laxity.  Less than a centimeter sulcus sign. 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  Plan at this time is arthroscopy in the lateral decubitus position for capsular repair followed by open humeral head allografting for the reverse Hill-Sachs lesion in the beachchair position.  Risk benefits are discussed include not limited to infection nerve vessel damage shoulder stiffness as well as potential for continued instability.  Patient understands risk benefits.  All questions answered.  Anderson Malta, MD 04/30/2020, 7:38 AM

## 2020-04-30 NOTE — Brief Op Note (Signed)
   04/30/2020  2:01 PM  PATIENT:  Terrence Riley  19 y.o. male  PRE-OPERATIVE DIAGNOSIS:  left shoulder posterior instability  POST-OPERATIVE DIAGNOSIS:  left shoulder posterior instability  PROCEDURE:  Procedure(s): LEFT SHOULDER POSTERIOR LABRAL REPAIR WITH ARTHROSCOPY, BICEPS TENDON RELEASE AND TENODESIS, debridement  OPEN ALLOGRAFT FOR REVERSE hill sachs LESION  SURGEON:  Surgeon(s): [name], MD  ASSISTANT: magnant pa  ANESTHESIA:   general  EBL: 25 ml    Total I/O In: 1800 [I.V.:1700; IV Piggyback:100] Out: 880 [Urine:805; Blood:75]  BLOOD ADMINISTERED: none  DRAINS: none   LOCAL MEDICATIONS USED:  vanco  SPECIMEN:  No Specimen  COUNTS:  YES  TOURNIQUET:  * No tourniquets in log *  DICTATION: .Other Dictation: Dictation Number [id number]  PLAN OF CARE: Discharge to home after PACU  PATIENT DISPOSITION:  PACU - hemodynamically stable

## 2020-04-30 NOTE — Op Note (Signed)
NAME: Terrence Riley, Terrence Riley. MEDICAL RECORD PX:[id number] ACCOUNT [id number] DATE OF BIRTH:August 30, 2000 FACILITY: [facility] LOCATION: [facility] PHYSICIAN:Lilliona Blakeney Randel Pigg, MD  OPERATIVE REPORT  DATE OF PROCEDURE:  04/30/2020  PREOPERATIVE DIAGNOSIS:  Left shoulder instability with posterior Bankart lesion capsular disruption and reverse Hill-Sachs lesion engaging.  POSTOPERATIVE DIAGNOSIS:  Left shoulder instability with posterior Bankart lesion capsular disruption and reverse Hill-Sachs lesion engaging.  PROCEDURE:   1.  Left shoulder arthroscopy with posterior labral repair.  2.  Limited debridement, biceps tendon release.  3.  Open biceps tenodesis. 4.  Open allograft for reverse Hill-Sachs humeral head fracture.  SURGEON:  [name], MD  ASSISTANT:  Annie Main, PA.  INDICATIONS:  The patient is a 19 year old patient with recurrent left shoulder instability who presents for operative management after explanation of risks and benefits.  DESCRIPTION OF PROCEDURE:  The patient was brought to the operating room where general endotracheal anesthesia was induced.  Preoperative IV antibiotics were administered.  The patient's left shoulder was examined under anesthesia was found to have 3+  posterior instability with engaging Hill-Sachs lesion with 2+ anterior instability with less than a centimeter sulcus sign.  The patient was then placed in lateral decubitus position with the right axilla and right peroneal nerve well padded.  The left  arm was suspended under 10 pounds of traction at 10 degrees of forward flexion and 45 degrees of abduction.  The left arm was then pre-scrubbed with alcohol and Betadine, allowed to air dry, prepped with DuraPrep solution and draped in a sterile manner.   Ioban used to seal the operative field and cover the axilla.  Timeout was called.  Posterior portal was created to create about a centimeter medial and inferior to the posterolateral margin of the  acromion.  Diagnostic arthroscopy was performed.   Anterior portal created under direct visualization.  The patient did have a labral detachment from the 12 o'clock to 6 o'clock position.  Anterior labrum was intact.  Next, portal reversal was performed.  A larger cannula was then placed in the posterior  portal.  Percutaneous portal was then placed.  Next, the periosteal elevator was used to help to elevate the labral tissue off of the glenoid.  Using a rasp, the glenoid was prepared.  After good tissue mobilization was performed, the first bite was  performed around the 5 o'clock position.  It did not take more than 5 mm of tissue from the glenoid rim.  This was then placed at this 5:30 position, was then advanced up to the 5 o'clock position and kept in place with a knotless SutureTak from [employer].   In a similar manner, 5 more sutures were placed at the 4:30, 3:30, 2:30, 1:30 and 12:30 positions.  A total of 6 anchors were utilized equidistant on that posterior half of the glenoid in order to restore the capsule labral tissue to the glenoid.  All  in all, a very stable repair was achieved.  Next, the biceps tendon was released and the superior labrum debrided.  Thorough irrigation was performed of the joint.  Instruments were removed and the portals were closed using 2-0 Vicryl, 3-0 nylon.  Next,  the patient was noted to have improved posterior instability with about 1+ posterior instability with firm endpoint.  The patient was placed from the lateral position into the beach chair position.  The head was in neutral position.  Left arm was then  reprepped, scrubbed and draped.  Charlie Pitter was then used to cover the  entire operative field.  The anterior portal was utilized and extended distally.  Skin and subcutaneous tissue were sharply divided.  Cephalic vein mobilized laterally.  Deltopectoral  interval was entered and a self-retaining retractor was placed.  The biceps tendon was retrieved and a tagging  suture was placed.  Rotator interval was then opened.  Next, the  subscapularis was taken down about three-quarters of the way off the lesser  tuberosity using a knife.  The subscapularis was tagged with 4 Vicryl sutures.  A reverse retractor was placed.  The axillary nerve was palpated and protected at all times during the case.  The reverse retractor placed in the reverse Hill-Sachs lesion  was visualized.  The reverse Hill-Sachs lesion was curetted with an osteotome in order to create a bleeding bony bed surface.  The humeral head allograft was then utilized and placed into position with a seamless transition between articular surface and  the humeral head allograft surface.  Two screws were then placed, each measuring about 30 mm.  This gave very good fixation of the allograft into the reverse Hill-Sachs lesion.  Next, thorough irrigation was performed.  The subscapularis was then  repaired using 2 FiberTak suture tapes which had 4 limbs per suture.  The subscap plus capsulotomy was then reapproximated to that articular margin edge of the medial aspect of the lesser tuberosity.  These 8 suture limbs were tied into 4 knots and then  crossed and then placed into 2 SwiveLocks within the bicipital groove.  The 4 traction sutures of 0 Vicryl were also incorporated.  The biceps tenodesis was then performed using the knotless SutureTak on the inferior SwiveLock, followed by four 0 Vicryl  sutures to reapproximate the tendon in the groove.  Rotator interval was then closed with the arm in external rotation 30 degrees.  Thorough irrigation of the joint was performed and vancomycin powder was placed both inside and outside of the joint.   Good stability was achieved and no over-tightening of the subscapularis was present.  Next, thorough irrigation was performed.  The deltopectoral interval was closed using #1 Vicryl suture, followed by interrupted inverted 0 Vicryl suture, 2-0 Vicryl  suture and a 3-0 Monocryl.   Aquacel dressing placed anteriorly and Tegaderm was placed posteriorly.  The patient was then placed in a gunslinger type splint with the arm facing forward.  The patient tolerated the procedure well without immediate  complications, transferred to the recovery room in stable condition.  Luke's assistance was required at all times during the case for retraction, opening and closing, mobilization of tissues.  His assistance was a medical necessity.  VN/NUANCE  D:04/30/2020 Riley:04/30/2020 JOB:[id number]

## 2020-04-30 NOTE — Transfer of Care (Signed)
Immediate Anesthesia Transfer of Care Note  Patient: Terrence Riley  Procedure(s) Performed: LEFT SHOULDER POSTERIOR LABRAL REPAIR WITH ARTHROSCOPY, BICEPS TENDON RELEASE AND TENODESIS, OPEN ALLOGRAFT FOR REVERSE BANKART LESION (Left Shoulder)  Patient Location: PACU  Anesthesia Type:General and Regional  Level of Consciousness: drowsy and patient cooperative  Airway & Oxygen Therapy: Patient Spontanous Breathing and Patient connected to face mask oxygen  Post-op Assessment: Report given to RN and Post -op Vital signs reviewed and stable  Post vital signs: Reviewed and stable  Last Vitals:  Vitals Value Taken Time  BP 127/78 04/30/20 1414  Temp    Pulse 75 04/30/20 1418  Resp 17 04/30/20 1418  SpO2 100 % 04/30/20 1418  Vitals shown include unvalidated device data.  Last Pain:  Vitals:   04/30/20 0621  TempSrc:   PainSc: 0-No pain         Complications: No complications documented.

## 2020-04-30 NOTE — Anesthesia Procedure Notes (Signed)
Anesthesia Regional Block: Interscalene brachial plexus block   Pre-Anesthetic Checklist: ,, timeout performed, Correct Patient, Correct Site, Correct Laterality, Correct Procedure, Correct Position, site marked, Risks and benefits discussed,  Surgical consent,  Pre-op evaluation,  At surgeon's request and post-op pain management  Laterality: Left  Prep: chloraprep       Needles:  Injection technique: Single-shot  Needle Type: Echogenic Stimulator Needle     Needle Length: 10cm  Needle Gauge: 20     Additional Needles:   Procedures:,,,, ultrasound used (permanent image in chart),,,,  Narrative:  Start time: 04/30/2020 7:00 AM End time: 04/30/2020 7:15 AM Injection made incrementally with aspirations every 5 mL.  Performed by: Personally  Anesthesiologist: Murvin Natal, MD  Additional Notes: Functioning IV was confirmed and monitors were applied.  A timeout was performed. Sterile prep, hand hygiene and sterile gloves were used. A 156mm 20ga BBraun echogenic stimulator needle was used. Negative aspiration and negative test dose prior to incremental administration of local anesthetic. The patient tolerated the procedure well.  Ultrasound guidance: relevent anatomy identified, needle position confirmed, local anesthetic spread visualized around nerve(s), vascular puncture avoided.  Image printed for medical record.

## 2020-05-01 ENCOUNTER — Encounter (HOSPITAL_COMMUNITY): Payer: Self-pay | Admitting: Orthopedic Surgery

## 2020-05-02 NOTE — Anesthesia Postprocedure Evaluation (Signed)
Anesthesia Post Note  Patient: Terrence Riley  Procedure(s) Performed: LEFT SHOULDER POSTERIOR LABRAL REPAIR WITH ARTHROSCOPY, BICEPS TENDON RELEASE AND TENODESIS, OPEN ALLOGRAFT FOR REVERSE BANKART LESION (Left Shoulder)     Patient location during evaluation: PACU Anesthesia Type: Regional Level of consciousness: awake and alert Pain management: pain level controlled Vital Signs Assessment: post-procedure vital signs reviewed and stable Respiratory status: spontaneous breathing, nonlabored ventilation and respiratory function stable Cardiovascular status: blood pressure returned to baseline and stable Postop Assessment: no apparent nausea or vomiting Anesthetic complications: no   No complications documented.  Last Vitals:  Vitals:   04/30/20 1515 04/30/20 1530  BP: (!) 107/59 (!) 109/56  Pulse: (!) 59 63  Resp: 11 16  Temp:  36.5 C  SpO2: 99% 99%    Last Pain:  Vitals:   04/30/20 1530  TempSrc:   PainSc: Asleep                 Lynda Rainwater

## 2020-05-08 ENCOUNTER — Ambulatory Visit: Payer: Self-pay

## 2020-05-08 ENCOUNTER — Ambulatory Visit (INDEPENDENT_AMBULATORY_CARE_PROVIDER_SITE_OTHER): Payer: Commercial Managed Care - PPO | Admitting: Orthopedic Surgery

## 2020-05-08 DIAGNOSIS — S43005D Unspecified dislocation of left shoulder joint, subsequent encounter: Secondary | ICD-10-CM | POA: Diagnosis not present

## 2020-05-08 DIAGNOSIS — M79671 Pain in right foot: Secondary | ICD-10-CM

## 2020-05-08 DIAGNOSIS — M25512 Pain in left shoulder: Secondary | ICD-10-CM | POA: Diagnosis not present

## 2020-05-11 ENCOUNTER — Encounter: Payer: Self-pay | Admitting: Orthopedic Surgery

## 2020-05-11 NOTE — Progress Notes (Signed)
Post-Op Visit Note   Patient: Terrence Riley           Date of Birth: Dec 12, 2000           MRN: [id number] Visit Date: 05/08/2020 PCP: Eulas Post, MD   Assessment & Plan:  Chief Complaint:  Chief Complaint  Patient presents with  . Left Shoulder - Routine Post Op   Visit Diagnoses:  1. Pain in right foot   2. Acute pain of left shoulder   3. Shoulder dislocation, left, subsequent encounter     Plan: Patient is a 19 year old male who presents s/p left shoulder posterior labral repair with biceps tendon release and tenodesis and humeral head allograft on 04/30/2020.  Patient notes that he is doing well.  His pain is well controlled.  He has been sling and is only taking pain medication as he needs to a couple times per day.  Sutures are intact and incisions are healing well.  Sutures were removed and replaced with Steri-Strips.  On exam he is 20 degrees external rotation, 50 degrees abduction, 80 degrees forward flexion.  No significant laxity with posterior force applied to the shoulder.  Axillary nerve is functioning.  Plan for patient to remain in sling over the next 2 weeks.  Follow-up in 2 weeks for clinical recheck and discontinuation of the sling.  No overhead lifting or lifting more than a couple pounds with the left arm.  Patient and mother agree with plan.  Radiographs of the left shoulder show screws in good position with no backing out.  Additionally, patient raises concern about heel pain that he has had since an injury on 03/04/2020.  He dropped onto his right heel from a 10 to 15 foot drop.  He notes that he was able to walk after this incident but was not able to walk the very next day and had significant amount of swelling and tenderness.  Denies any pain in the knee or elsewhere in the foot/ankle.  He has continued right heel pain that seems to have plateaued.  He is able to weight-bear now and does not walk with a limp.  Only positive physical exam finding is some  tenderness with compression of the calcaneus.  Radiographs of the right foot are negative for any obvious displaced calcaneus fracture.  Impression is bone bruise of the right calcaneus versus healed fracture of the calcaneus.  Regardless, with how much time there has been out from the injury, advanced imaging would not provide any additional options as far as treatment goes.  Pain should improve over the next several months to a year and we will continue to monitor this.  Follow-Up Instructions: No follow-ups on file.   Orders:  Orders Placed This Encounter  Procedures  . XR Foot Complete Right  . XR Shoulder Left   No orders of the defined types were placed in this encounter.   Imaging: No results found.  PMFS History: Patient Active Problem List   Diagnosis Date Noted  . Labral tear of shoulder, left, subsequent encounter 02/13/2020  . Shoulder dislocation, left, subsequent encounter 02/13/2020  . Seizure disorder ([place]) 12/22/2019  . Migraines 12/22/2019  . Mild intermittent asthma 12/22/2019   Past Medical History:  Diagnosis Date  . Allergy    rhinitis  . Asthma    as a child  . Complication of anesthesia    pt. reports he need more anesthesia with the septoplasty surgery  . Eosinophilic esophagitis  heartburn and reflux  . Family history of adverse reaction to anesthesia    mother respiratory failure  . Nasal fracture    deviated septum  . Pneumonia    1x history of  . Premature baby    2 months early  . Seizures ([place])    well controlled on meds/ one 2 months ago when meds were late    Family History  Problem Relation Age of Onset  . Cancer Mother        breat  . Protein S deficiency Mother     Past Surgical History:  Procedure Laterality Date  . ADENOIDECTOMY    . CLOSED REDUCTION NASAL FRACTURE N/A 08/31/2017   Procedure: CLOSED REDUCTION NASAL FRACTURE;  Surgeon: Clyde Canterbury, MD;  Location: [place];  Service: ENT;  Laterality: N/A;  .  SEPTOPLASTY N/A 08/31/2017   Procedure: SEPTOPLASTY;  Surgeon: Clyde Canterbury, MD;  Location: [place];  Service: ENT;  Laterality: N/A;  . SHOULDER ARTHROSCOPY WITH LABRAL REPAIR Left 04/30/2020   Procedure: LEFT SHOULDER POSTERIOR LABRAL REPAIR WITH ARTHROSCOPY, BICEPS TENDON RELEASE AND TENODESIS, OPEN ALLOGRAFT FOR REVERSE BANKART LESION;  Surgeon: [name], MD;  Location: [place] [facility];  Service: Orthopedics;  Laterality: Left;  . TONSILLECTOMY     and addenoids   Social History   Occupational History  . Not on file  Tobacco Use  . Smoking status: Never Smoker  . Smokeless tobacco: Never Used  Vaping Use  . Vaping Use: Never used  Substance and Sexual Activity  . Alcohol use: No  . Drug use: No  . Sexual activity: Not on file

## 2020-05-22 ENCOUNTER — Other Ambulatory Visit: Payer: Self-pay

## 2020-05-22 ENCOUNTER — Ambulatory Visit (INDEPENDENT_AMBULATORY_CARE_PROVIDER_SITE_OTHER): Payer: Commercial Managed Care - PPO | Admitting: Orthopedic Surgery

## 2020-05-22 DIAGNOSIS — S43432D Superior glenoid labrum lesion of left shoulder, subsequent encounter: Secondary | ICD-10-CM

## 2020-05-25 ENCOUNTER — Encounter: Payer: Self-pay | Admitting: Orthopedic Surgery

## 2020-05-25 NOTE — Progress Notes (Signed)
Post-Op Visit Note   Patient: Terrence Riley           Date of Birth: 2001/05/11           MRN: [id number] Visit Date: 05/22/2020 PCP: Eulas Post, MD   Assessment & Plan:  Chief Complaint:  Chief Complaint  Patient presents with  . Left Shoulder - Routine Post Op   Visit Diagnoses:  1. Labral tear of shoulder, left, subsequent encounter     Plan: Patient is a 19 year old male presents s/p left shoulder posterior labral repair with humeral head allograft placement.  He notes that his pain is well controlled.  He has been compliant with restrictions and remaining in the sling.  Incisions are healing well.  On exam he is 30 degrees external rotation, 80 degrees abduction, 95 degrees forward flexion.  Excellent stability posteriorly.  Plan to start physical therapy upstairs.  Physical therapy will work on passive range of motion with external rotation to 30 degrees maximum and with no overhead range of motion or cross body adduction of the arm.  Okay for isolated deltoid strengthening exercises and isolated rotator cuff strengthening exercises.  He does have some weakness of subscapularis on exam but it is expected at this point in his recovery, should continue to improve.  Strongly encourage patient to avoid crossing his arm across his body.  Patient understands.  Plan to follow-up in 3 weeks for clinical recheck.  We will discontinue the sling entirely in 3 weeks and allow him to return to work in a limited capacity, in order to help support his family.  Patient agreed with this plan.  Additionally, he notes that his heel is improving.  Follow-Up Instructions: No follow-ups on file.   Orders:  No orders of the defined types were placed in this encounter.  No orders of the defined types were placed in this encounter.   Imaging: No results found.  PMFS History: Patient Active Problem List   Diagnosis Date Noted  . Labral tear of shoulder, left, subsequent encounter  02/13/2020  . Shoulder dislocation, left, subsequent encounter 02/13/2020  . Seizure disorder ([place]) 12/22/2019  . Migraines 12/22/2019  . Mild intermittent asthma 12/22/2019   Past Medical History:  Diagnosis Date  . Allergy    rhinitis  . Asthma    as a child  . Complication of anesthesia    pt. reports he need more anesthesia with the septoplasty surgery  . Eosinophilic esophagitis    heartburn and reflux  . Family history of adverse reaction to anesthesia    mother respiratory failure  . Nasal fracture    deviated septum  . Pneumonia    1x history of  . Premature baby    2 months early  . Seizures ([place])    well controlled on meds/ one 2 months ago when meds were late    Family History  Problem Relation Age of Onset  . Cancer Mother        breat  . Protein S deficiency Mother     Past Surgical History:  Procedure Laterality Date  . ADENOIDECTOMY    . CLOSED REDUCTION NASAL FRACTURE N/A 08/31/2017   Procedure: CLOSED REDUCTION NASAL FRACTURE;  Surgeon: Clyde Canterbury, MD;  Location: [place];  Service: ENT;  Laterality: N/A;  . SEPTOPLASTY N/A 08/31/2017   Procedure: SEPTOPLASTY;  Surgeon: Clyde Canterbury, MD;  Location: [place];  Service: ENT;  Laterality: N/A;  . SHOULDER ARTHROSCOPY WITH LABRAL  REPAIR Left 04/30/2020   Procedure: LEFT SHOULDER POSTERIOR LABRAL REPAIR WITH ARTHROSCOPY, BICEPS TENDON RELEASE AND TENODESIS, OPEN ALLOGRAFT FOR REVERSE BANKART LESION;  Surgeon: [name], MD;  Location: [place];  Service: Orthopedics;  Laterality: Left;  . TONSILLECTOMY     and addenoids   Social History   Occupational History  . Not on file  Tobacco Use  . Smoking status: Never Smoker  . Smokeless tobacco: Never Used  Vaping Use  . Vaping Use: Never used  Substance and Sexual Activity  . Alcohol use: No  . Drug use: No  . Sexual activity: Not on file

## 2020-05-28 ENCOUNTER — Telehealth: Payer: Self-pay | Admitting: Orthopedic Surgery

## 2020-05-28 DIAGNOSIS — S43432D Superior glenoid labrum lesion of left shoulder, subsequent encounter: Secondary | ICD-10-CM

## 2020-05-28 NOTE — Telephone Encounter (Signed)
Patient called. Says he was suppose to start PT. I informed him that there is not a referral for PT in the system. He would like a call back letting him know if he need to have PT. His call back number is [phone]

## 2020-05-29 NOTE — Telephone Encounter (Signed)
I spoke with Dr Marlou Sa. An order has now been put in for this.

## 2020-05-29 NOTE — Addendum Note (Signed)
Addended byLaurann Montana on: 05/29/2020 02:02 PM   Modules accepted: Orders

## 2020-05-30 ENCOUNTER — Other Ambulatory Visit: Payer: Self-pay

## 2020-05-30 ENCOUNTER — Telehealth: Payer: Self-pay | Admitting: Orthopedic Surgery

## 2020-05-30 DIAGNOSIS — S43432D Superior glenoid labrum lesion of left shoulder, subsequent encounter: Secondary | ICD-10-CM

## 2020-05-31 ENCOUNTER — Other Ambulatory Visit: Payer: Self-pay

## 2020-05-31 ENCOUNTER — Encounter: Payer: Self-pay | Admitting: Surgical

## 2020-05-31 ENCOUNTER — Ambulatory Visit (INDEPENDENT_AMBULATORY_CARE_PROVIDER_SITE_OTHER): Payer: Commercial Managed Care - PPO | Admitting: Surgical

## 2020-05-31 ENCOUNTER — Ambulatory Visit (INDEPENDENT_AMBULATORY_CARE_PROVIDER_SITE_OTHER): Payer: Commercial Managed Care - PPO

## 2020-05-31 DIAGNOSIS — S43432D Superior glenoid labrum lesion of left shoulder, subsequent encounter: Secondary | ICD-10-CM

## 2020-05-31 NOTE — Progress Notes (Signed)
Post-Op Visit Note   Patient: Terrence Riley           Date of Birth: 2001-04-21           MRN: [id number] Visit Date: 05/31/2020 PCP: Eulas Post, MD   Assessment & Plan:  Chief Complaint:  Chief Complaint  Patient presents with   Left Shoulder - Pain, Routine Post Op   Visit Diagnoses:  1. Labral tear of shoulder, left, subsequent encounter     Plan: Patient is a 19 year old male who presents following left shoulder surgery on 04/30/2020.  He notes that he was running up his stairs at home to clean his room when he felt his shoulder sublux posteriorly.  This is the first instability events since his procedure.  He states that he has had no significant increase in pain or any sort of disability since the event yesterday.  He does note that he tried bowling on Wednesday and has been at basketball practice dribbling using his right arm and not using his left arm for any of these activities.  On exam he has 45 degrees external rotation, 85 degrees abduction, 110 degrees forward flexion.  Incisions of healed well.  Axillary nerve functioning well.  No significant crepitus felt with passive range of motion of the shoulder.  Radiographs are negative for any shoulder dislocation or significant change in position of the hardware since previous radiographs.  Plan for patient to continue using his sling full-time until the next appointment.  Avoid any significant physical activities.  Postpone physical therapy until the next appointment.  He may do pendulums multiple times per day but must return to the sling upon finishing his pendulum exercises.  Not necessary to sleep in the sling.  Plan for patient to follow-up as scheduled on 06/12/2020.  Will likely discontinue sling fully at that time and transition to physical therapy at that point.  Follow-Up Instructions: No follow-ups on file.   Orders:  Orders Placed This Encounter  Procedures   XR Shoulder Left   No orders of the defined  types were placed in this encounter.   Imaging: No results found.  PMFS History: Patient Active Problem List   Diagnosis Date Noted   Labral tear of shoulder, left, subsequent encounter 02/13/2020   Shoulder dislocation, left, subsequent encounter 02/13/2020   Seizure disorder ([place]) 12/22/2019   Migraines 12/22/2019   Mild intermittent asthma 12/22/2019   Past Medical History:  Diagnosis Date   Allergy    rhinitis   Asthma    as a child   Complication of anesthesia    pt. reports he need more anesthesia with the septoplasty surgery   Eosinophilic esophagitis    heartburn and reflux   Family history of adverse reaction to anesthesia    mother respiratory failure   Nasal fracture    deviated septum   Pneumonia    1x history of   Premature baby    2 months early   Seizures ([place])    well controlled on meds/ one 2 months ago when meds were late    Family History  Problem Relation Age of Onset   Cancer Mother        breat   Protein S deficiency Mother     Past Surgical History:  Procedure Laterality Date   ADENOIDECTOMY     CLOSED REDUCTION NASAL FRACTURE N/A 08/31/2017   Procedure: CLOSED REDUCTION NASAL FRACTURE;  Surgeon: Clyde Canterbury, MD;  Location: [place];  Service: ENT;  Laterality: N/A;   SEPTOPLASTY N/A 08/31/2017   Procedure: SEPTOPLASTY;  Surgeon: Clyde Canterbury, MD;  Location: [facility];  Service: ENT;  Laterality: N/A;   SHOULDER ARTHROSCOPY WITH LABRAL REPAIR Left 04/30/2020   Procedure: LEFT SHOULDER POSTERIOR LABRAL REPAIR WITH ARTHROSCOPY, BICEPS TENDON RELEASE AND TENODESIS, OPEN ALLOGRAFT FOR REVERSE BANKART LESION;  Surgeon: [name], MD;  Location: [facility];  Service: Orthopedics;  Laterality: Left;   TONSILLECTOMY     and addenoids   Social History   Occupational History   Not on file  Tobacco Use   Smoking status: Never Smoker   Smokeless tobacco: Never Used  Vaping Use   Vaping Use: Never  used  Substance and Sexual Activity   Alcohol use: No   Drug use: No   Sexual activity: Not on file

## 2020-06-03 ENCOUNTER — Telehealth: Payer: Self-pay | Admitting: Orthopedic Surgery

## 2020-06-03 ENCOUNTER — Ambulatory Visit: Payer: Commercial Managed Care - PPO | Admitting: Physical Therapy

## 2020-06-03 NOTE — Telephone Encounter (Signed)
I tried calling patient to discuss. He stated he would like to speak with you or Dr Marlou Sa directly. He said that he was certain the concerns he had were nothing that I could help him with. Please call patient. Thanks.

## 2020-06-03 NOTE — Telephone Encounter (Signed)
Patient called asked for a call back concerning a personal issue that has developed after surgery. Patient would not give any other information. Patient wanted to talk with Dr. Marlou Sa. The number to contact patient is [phone]

## 2020-06-03 NOTE — Telephone Encounter (Signed)
Called to discuss with patient, Doing okay, struggling with surgical recovery from a mental standpoint but doing well to remain immobilized as directed

## 2020-06-12 ENCOUNTER — Ambulatory Visit (INDEPENDENT_AMBULATORY_CARE_PROVIDER_SITE_OTHER): Payer: Commercial Managed Care - PPO | Admitting: Orthopedic Surgery

## 2020-06-12 DIAGNOSIS — S43432D Superior glenoid labrum lesion of left shoulder, subsequent encounter: Secondary | ICD-10-CM

## 2020-06-15 ENCOUNTER — Encounter: Payer: Self-pay | Admitting: Orthopedic Surgery

## 2020-06-15 NOTE — Progress Notes (Signed)
   Post-Op Visit Note   Patient: Terrence Riley           Date of Birth: [birth year]           MRN: [id number] Visit Date: 06/12/2020 PCP: Eulas Post, MD   Assessment & Plan:  Chief Complaint:  Chief Complaint  Patient presents with  . Left Shoulder - Routine Post Op   Visit Diagnoses:  1. Labral tear of shoulder, left, subsequent encounter     Plan: Patient presents for follow-up of left shoulder surgery.  He is 3 weeks out.  On exam there is excellent posterior stability.  No coarse grinding with passive range of motion.  Plan is to continue the sling through the weekend.  No physical therapy to start until 1129.  4-week return.  I would favor no crossing the midline sagittal plane with the left arm until return clinic visit.  No overhead motion.  Follow-up at that time.  Follow-Up Instructions: Return in about 4 weeks (around 07/10/2020).   Orders:  No orders of the defined types were placed in this encounter.  No orders of the defined types were placed in this encounter.   Imaging: No results found.  PMFS History: Patient Active Problem List   Diagnosis Date Noted  . Labral tear of shoulder, left, subsequent encounter 02/13/2020  . Shoulder dislocation, left, subsequent encounter 02/13/2020  . Seizure disorder ([place]) 12/22/2019  . Migraines 12/22/2019  . Mild intermittent asthma 12/22/2019   Past Medical History:  Diagnosis Date  . Allergy    rhinitis  . Asthma    as a child  . Complication of anesthesia    pt. reports he need more anesthesia with the septoplasty surgery  . Eosinophilic esophagitis    heartburn and reflux  . Family history of adverse reaction to anesthesia    mother respiratory failure  . Nasal fracture    deviated septum  . Pneumonia    1x history of  . Premature baby    2 months early  . Seizures ([place])    well controlled on meds/ one 2 months ago when meds were late    Family History  Problem Relation Age of Onset  .  Cancer Mother        breat  . Protein S deficiency Mother     Past Surgical History:  Procedure Laterality Date  . ADENOIDECTOMY    . CLOSED REDUCTION NASAL FRACTURE N/A 08/31/2017   Procedure: CLOSED REDUCTION NASAL FRACTURE;  Surgeon: Clyde Canterbury, MD;  Location: [place] [facility];  Service: ENT;  Laterality: N/A;  . SEPTOPLASTY N/A 08/31/2017   Procedure: SEPTOPLASTY;  Surgeon: Clyde Canterbury, MD;  Location: [facility];  Service: ENT;  Laterality: N/A;  . SHOULDER ARTHROSCOPY WITH LABRAL REPAIR Left 04/30/2020   Procedure: LEFT SHOULDER POSTERIOR LABRAL REPAIR WITH ARTHROSCOPY, BICEPS TENDON RELEASE AND TENODESIS, OPEN ALLOGRAFT FOR REVERSE BANKART LESION;  Surgeon: [name], MD;  Location: [facility];  Service: Orthopedics;  Laterality: Left;  . TONSILLECTOMY     and addenoids   Social History   Occupational History  . Not on file  Tobacco Use  . Smoking status: Never Smoker  . Smokeless tobacco: Never Used  Vaping Use  . Vaping Use: Never used  Substance and Sexual Activity  . Alcohol use: No  . Drug use: No  . Sexual activity: Not on file

## 2020-06-24 ENCOUNTER — Encounter: Payer: Self-pay | Admitting: Physical Therapy

## 2020-06-24 ENCOUNTER — Ambulatory Visit (INDEPENDENT_AMBULATORY_CARE_PROVIDER_SITE_OTHER): Payer: Commercial Managed Care - PPO | Admitting: Physical Therapy

## 2020-06-24 ENCOUNTER — Other Ambulatory Visit: Payer: Self-pay

## 2020-06-24 DIAGNOSIS — R293 Abnormal posture: Secondary | ICD-10-CM | POA: Diagnosis not present

## 2020-06-24 DIAGNOSIS — M6281 Muscle weakness (generalized): Secondary | ICD-10-CM

## 2020-06-24 DIAGNOSIS — M25612 Stiffness of left shoulder, not elsewhere classified: Secondary | ICD-10-CM | POA: Diagnosis not present

## 2020-06-24 NOTE — Therapy (Signed)
[facility] Physical Therapy [address] [place], [place], [address] Phone: [phone]   Fax:  [phone]  Physical Therapy Evaluation  Patient Details  Name: Terrence Riley MRN: [id number] Date of Birth: [birth year] Referring Provider (PT): [name], MD   Encounter Date: 06/24/2020   PT End of Session - 06/24/20 1327    Visit Number 1    Number of Visits 10    Date for PT Re-Evaluation 08/19/20    PT Start Time 1300    PT Stop Time 1325    PT Time Calculation (min) 25 min    Activity Tolerance Patient tolerated treatment well    Behavior During Therapy [facility] for tasks assessed/performed           Past Medical History:  Diagnosis Date  . Allergy    rhinitis  . Asthma    as a child  . Complication of anesthesia    pt. reports he need more anesthesia with the septoplasty surgery  . Eosinophilic esophagitis    heartburn and reflux  . Family history of adverse reaction to anesthesia    mother respiratory failure  . Nasal fracture    deviated septum  . Pneumonia    1x history of  . Premature baby    2 months early  . Seizures ([place])    well controlled on meds/ one 2 months ago when meds were late    Past Surgical History:  Procedure Laterality Date  . ADENOIDECTOMY    . CLOSED REDUCTION NASAL FRACTURE N/A 08/31/2017   Procedure: CLOSED REDUCTION NASAL FRACTURE;  Surgeon: Clyde Canterbury, MD;  Location: [place];  Service: ENT;  Laterality: N/A;  . SEPTOPLASTY N/A 08/31/2017   Procedure: SEPTOPLASTY;  Surgeon: Clyde Canterbury, MD;  Location: [place];  Service: ENT;  Laterality: N/A;  . SHOULDER ARTHROSCOPY WITH LABRAL REPAIR Left 04/30/2020   Procedure: LEFT SHOULDER POSTERIOR LABRAL REPAIR WITH ARTHROSCOPY, BICEPS TENDON RELEASE AND TENODESIS, OPEN ALLOGRAFT FOR REVERSE BANKART LESION;  Surgeon: [name], MD;  Location: [place];  Service: Orthopedics;  Laterality: Left;  . TONSILLECTOMY     and addenoids     There were no vitals filed for this visit.    Subjective Assessment - 06/24/20 1302    Subjective Pt is a 19 y/o male who presents to [facility] s/p Lt shoulder labral repair on 04/30/20.  Pt presents today with resolved pain, and overall feels shoulder is doing well.    Limitations Lifting    Patient Stated Goals basketball showcase on 12/4 - wants to play    Currently in Pain? No/denies              [facility] PT Assessment - 06/24/20 1305      Assessment   Medical Diagnosis S43.432D (ICD-10-CM) - Labral tear of shoulder, left, subsequent encounter    Referring Provider (PT) [name], MD    Onset Date/Surgical Date 04/30/20    Hand Dominance Right    Next MD Visit 07/12/20    Prior Therapy none      Precautions   Precaution Comments no overhead motion, no horizontal adduction, external rotation 30 deg max      Restrictions   Weight Bearing Restrictions No      Balance Screen   Has the patient fallen in the past 6 months No    Has the patient had a decrease in activity level because of a fear of falling?  No  Is the patient reluctant to leave their home because of a fear of falling?  No      Home Ecologist residence    Living Arrangements Parent   mother   Type of Home Other(Comment)    Home Access --   condo   Additional Comments mother currently needing some assistance with home       Prior Function   Level of Independence Independent    Vocation Unemployed;Student    Forensic scientist at [employer]; was going to work at [employer] basketball, bowling, playing pool; was going to gym 5x/wk      Observation/Other Assessments   Focus on Therapeutic Outcomes (FOTO)  74 (predicted 82)      Posture/Postural Control   Posture/Postural Control Postural limitations    Postural Limitations Rounded Shoulders;Forward head      ROM / Strength   AROM / PROM / Strength PROM;Strength;AROM       AROM   Overall AROM Comments not formally measured due to precautions - pt actively moving arm throughout eval and AROM at least 90 for flexion/abduction      PROM   Overall PROM Comments Lt shoulder likely could go further-PROM kept within restrictions    PROM Assessment Site Shoulder    Right/Left Shoulder Left    Left Shoulder Flexion 90 Degrees    Left Shoulder ABduction 90 Degrees    Left Shoulder Internal Rotation 30 Degrees      Strength   Overall Strength Comments deferred due to post-op precautions                      Objective measurements completed on examination: See above findings.       [facility] Adult PT Treatment/Exercise - 06/24/20 1305      Exercises   Exercises Other Exercises    Other Exercises  shoulder isometrics - see pt instructions, performed 3-5 reps each direction                  PT Education - 06/24/20 1325    Education Details HEP, MD precautions, no basketball until cleared by MD    Person(s) Educated Patient    Methods Explanation;Demonstration;Handout    Comprehension Verbalized understanding;Returned demonstration;Need further instruction            PT Short Term Goals - 06/24/20 1331      PT SHORT TERM GOAL #1   Title independent with initial HEP    Status New    Target Date 07/22/20             PT Long Term Goals - 06/24/20 1331      PT LONG TERM GOAL #1   Title Independent with HEP    Status New    Target Date 08/19/20      PT LONG TERM GOAL #2   Title Improve Lt shoulder AROM to WNL for improved function    Status New    Target Date 08/19/20      PT LONG TERM GOAL #3   Title FOTO score improved to 82    Status New    Target Date 08/19/20      PT LONG TERM GOAL #4   Title Demonstrate 5/5 Lt shoulder strength for improved function    Status New    Target Date 08/19/20  Plan - 06/24/20 1328    Clinical Impression Statement Pt is a 19 y/o male who presents to [facility]  s/p Lt shoulder labral repair on 04/30/20.  Pt demonstrates decreased ROM and strength, as well as postural abnormalities affecting functional mobility.  Pt at times not following MD precautions, and reports he wants to play basketball this Saturday 12/4.  Advised no basketball until cleared by MD. Pt will benefit from PT to address deficits listed.    Personal Factors and Comorbidities Age;Behavior Pattern;Comorbidity 1    Comorbidities seizures, reports not following MD precautions/recommendations    Examination-Activity Limitations Lift;Reach Overhead    Examination-Participation Restrictions Community Activity;School;Other   sports   Stability/Clinical Decision Making Evolving/Moderate complexity    Clinical Decision Making Moderate    Rehab Potential Good    PT Frequency 2x / week   1-2x/wk x 8 weeks; no follow up until after MD appt and can progress activity   PT Duration 8 weeks    PT Treatment/Interventions ADLs/Self Care Home Management;Cryotherapy;Electrical Stimulation;Moist Heat;Therapeutic exercise;Therapeutic activities;Ultrasound;Patient/family education;Manual techniques;Taping;Dry needling;Passive range of motion    PT Next Visit Plan see what MD says for progression of PT, begin AROM/strengthening as able    PT Home Exercise Plan Access Code: [email or web address]    Consulted and Agree with Plan of Care Patient           Patient will benefit from skilled therapeutic intervention in order to improve the following deficits and impairments:  Decreased strength, Postural dysfunction, Decreased range of motion, Decreased knowledge of precautions  Visit Diagnosis: Stiffness of left shoulder, not elsewhere classified - Plan: PT plan of care cert/re-cert  Muscle weakness (generalized) - Plan: PT plan of care cert/re-cert  Abnormal posture - Plan: PT plan of care cert/re-cert     Problem List Patient Active Problem List   Diagnosis Date Noted  . Labral tear of shoulder, left, subsequent  encounter 02/13/2020  . Shoulder dislocation, left, subsequent encounter 02/13/2020  . Seizure disorder ([place]) 12/22/2019  . Migraines 12/22/2019  . Mild intermittent asthma 12/22/2019      Laureen Abrahams, PT, DPT 06/24/20 1:35 PM    [facility] Physical Therapy [address] [place], [place], [address] Phone: [phone]   Fax:  [phone]  Name: Jayd Cadieux MRN: [id number] Date of Birth: [birth year]

## 2020-06-24 NOTE — Patient Instructions (Signed)
Access Code: [email or web address] URL: [email or web address] Date: 06/24/2020 Prepared by: Faustino Congress  Exercises Standing Isometric Shoulder External Rotation with Doorway - 1 x daily - 7 x weekly - 3 sets - 10 reps Standing Isometric Shoulder Internal Rotation with Towel Roll at Doorway - 1 x daily - 7 x weekly - 3 sets - 10 reps Isometric Shoulder Extension at Wall - 1 x daily - 7 x weekly - 3 sets - 10 reps Standing Isometric Shoulder Flexion with Doorway - Arm Bent - 1 x daily - 7 x weekly - 3 sets - 10 reps Standing Isometric Shoulder Abduction with Doorway - Arm Bent - 1 x daily - 7 x weekly - 3 sets - 10 reps

## 2020-07-01 ENCOUNTER — Telehealth: Payer: Self-pay

## 2020-07-01 NOTE — Telephone Encounter (Signed)
Patient called he stated he went to pt and got sent home he would like a call back to discuss next steps. CB: [phone]

## 2020-07-01 NOTE — Telephone Encounter (Signed)
I called, no answer.  ?

## 2020-07-02 NOTE — Telephone Encounter (Signed)
I called, no answer.  ?

## 2020-07-02 NOTE — Telephone Encounter (Signed)
IC no answer

## 2020-07-03 NOTE — Telephone Encounter (Signed)
IC again. No answer.

## 2020-07-09 ENCOUNTER — Encounter (HOSPITAL_COMMUNITY): Payer: Self-pay

## 2020-07-09 ENCOUNTER — Emergency Department (HOSPITAL_COMMUNITY)
Admission: EM | Admit: 2020-07-09 | Discharge: 2020-07-10 | Disposition: A | Discharge status: Home / Self Care | Payer: Commercial Managed Care - PPO | Attending: Emergency Medicine | Admitting: Emergency Medicine

## 2020-07-09 ENCOUNTER — Other Ambulatory Visit: Payer: Self-pay

## 2020-07-09 DIAGNOSIS — Z91010 Allergy to peanuts: Secondary | ICD-10-CM | POA: Insufficient documentation

## 2020-07-09 DIAGNOSIS — Z79899 Other long term (current) drug therapy: Secondary | ICD-10-CM | POA: Insufficient documentation

## 2020-07-09 DIAGNOSIS — J4520 Mild intermittent asthma, uncomplicated: Secondary | ICD-10-CM | POA: Insufficient documentation

## 2020-07-09 DIAGNOSIS — T782XXA Anaphylactic shock, unspecified, initial encounter: Secondary | ICD-10-CM

## 2020-07-09 DIAGNOSIS — T7801XA Anaphylactic reaction due to peanuts, initial encounter: Secondary | ICD-10-CM | POA: Insufficient documentation

## 2020-07-09 NOTE — ED Notes (Signed)
EMS also reports 4mg  zofran for nausea

## 2020-07-09 NOTE — ED Triage Notes (Signed)
Pt is allergic to peanuts and ate banana pudding that had nuts in it Pt was given solumedrol, 2 doses of epinephrine, 2 duo-neb treatments and 50 mg benedryl po prior to EMS arrival

## 2020-07-09 NOTE — ED Triage Notes (Signed)
Pt denies any signs of hives or any skin reaction, just respiratory

## 2020-07-10 ENCOUNTER — Ambulatory Visit (INDEPENDENT_AMBULATORY_CARE_PROVIDER_SITE_OTHER): Payer: Commercial Managed Care - PPO | Admitting: Orthopedic Surgery

## 2020-07-10 ENCOUNTER — Emergency Department (HOSPITAL_COMMUNITY): Payer: Commercial Managed Care - PPO

## 2020-07-10 DIAGNOSIS — S43432D Superior glenoid labrum lesion of left shoulder, subsequent encounter: Secondary | ICD-10-CM

## 2020-07-10 IMAGING — CR DG NECK SOFT TISSUE
3 series · 3 of 3 positions shown · non-contrast
Comparison: None.

CLINICAL DATA: Globus sensation

EXAM:
NECK SOFT TISSUES - 1+ VIEW

[w soft tissue neck lat (1 of 3)]
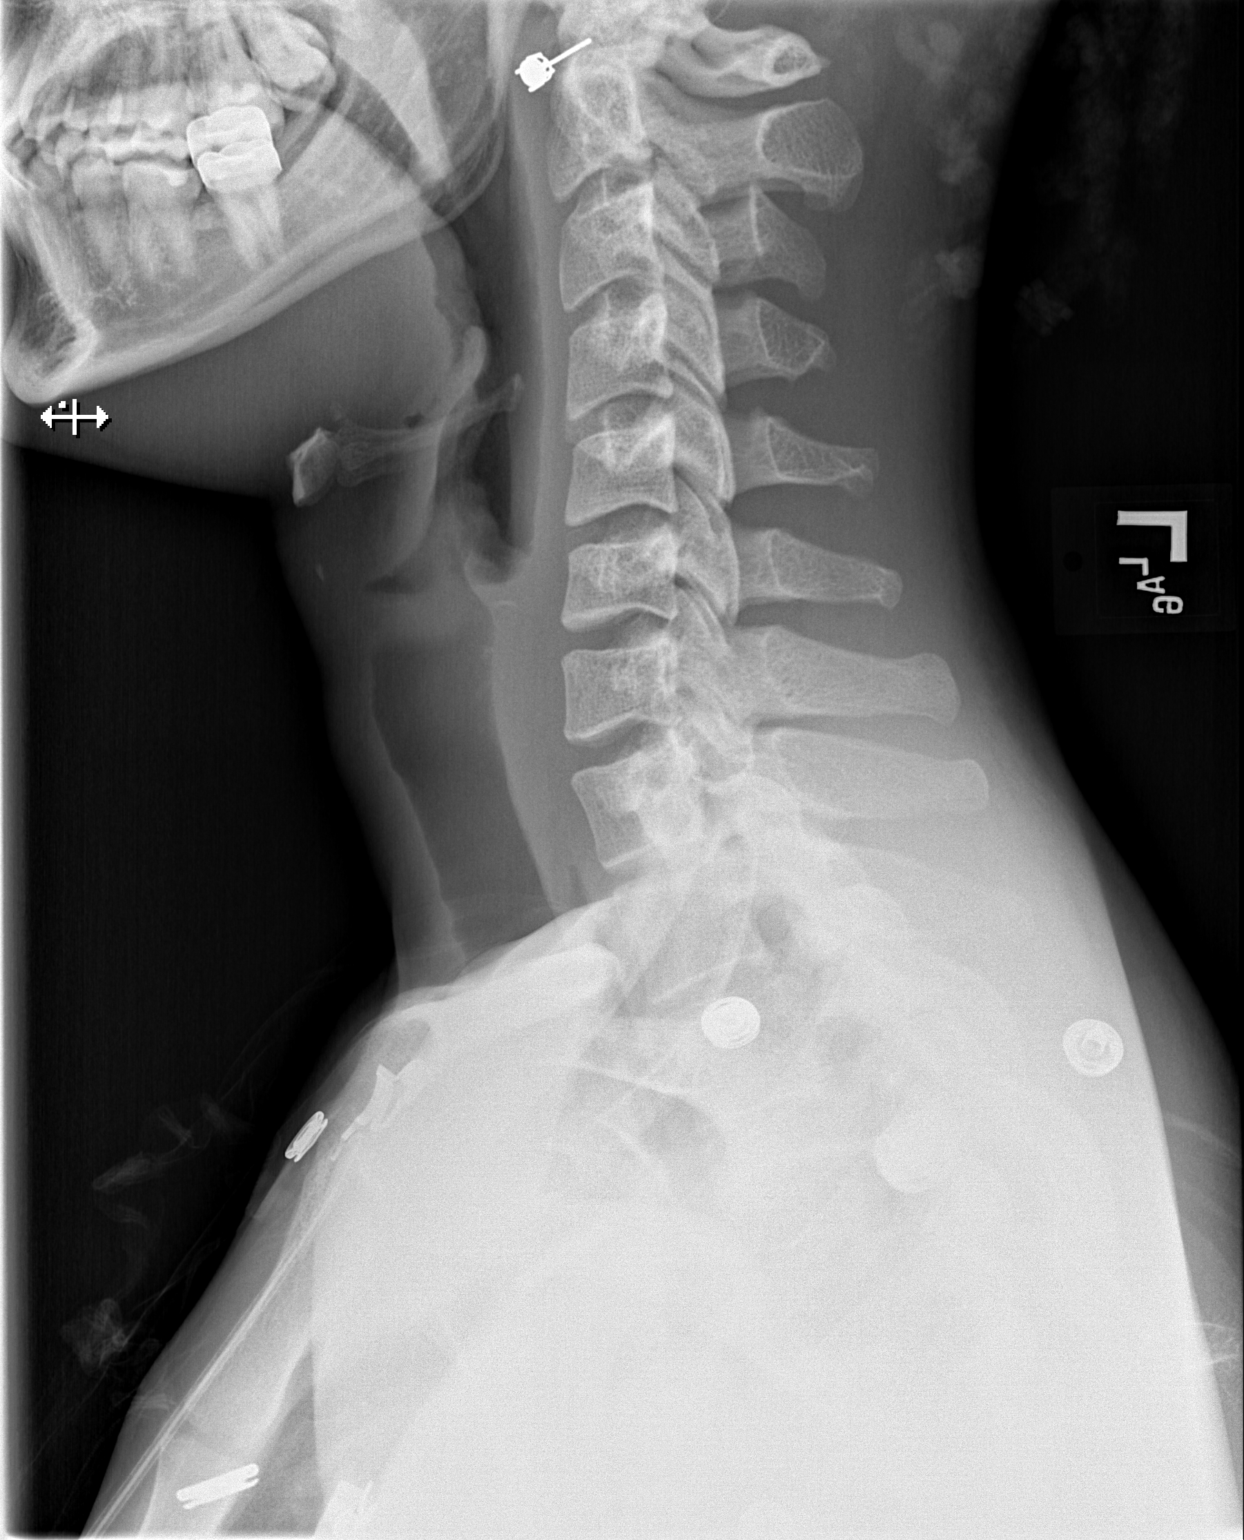

[w soft tissue neck lat (2 of 3)]
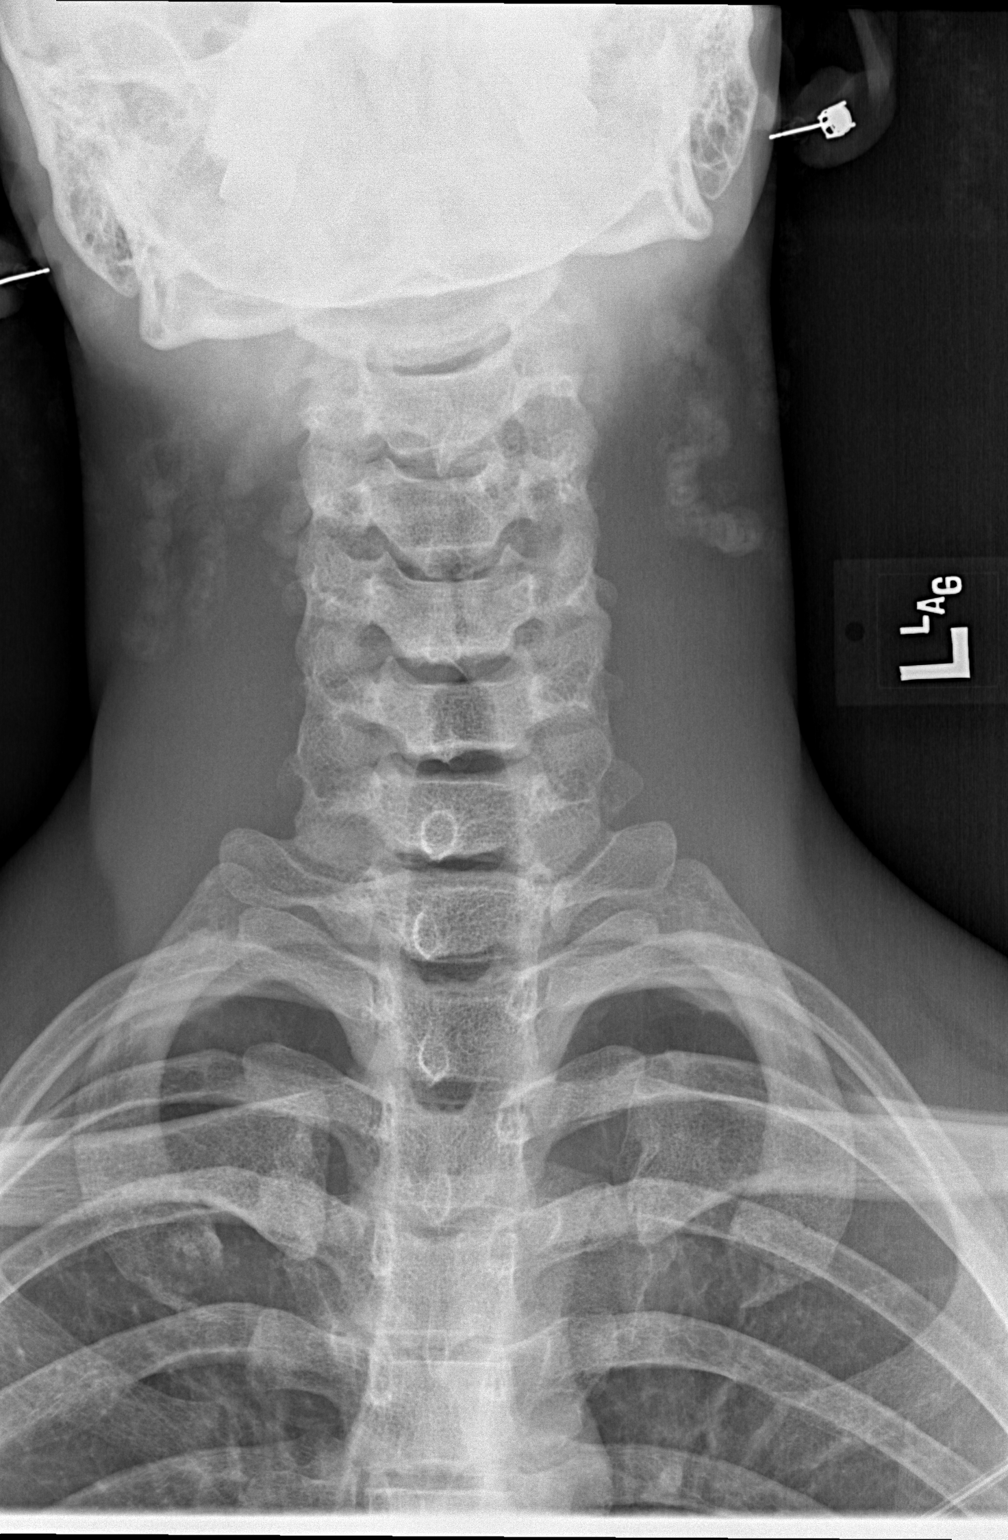

[w soft tissue neck lat (3 of 3)]
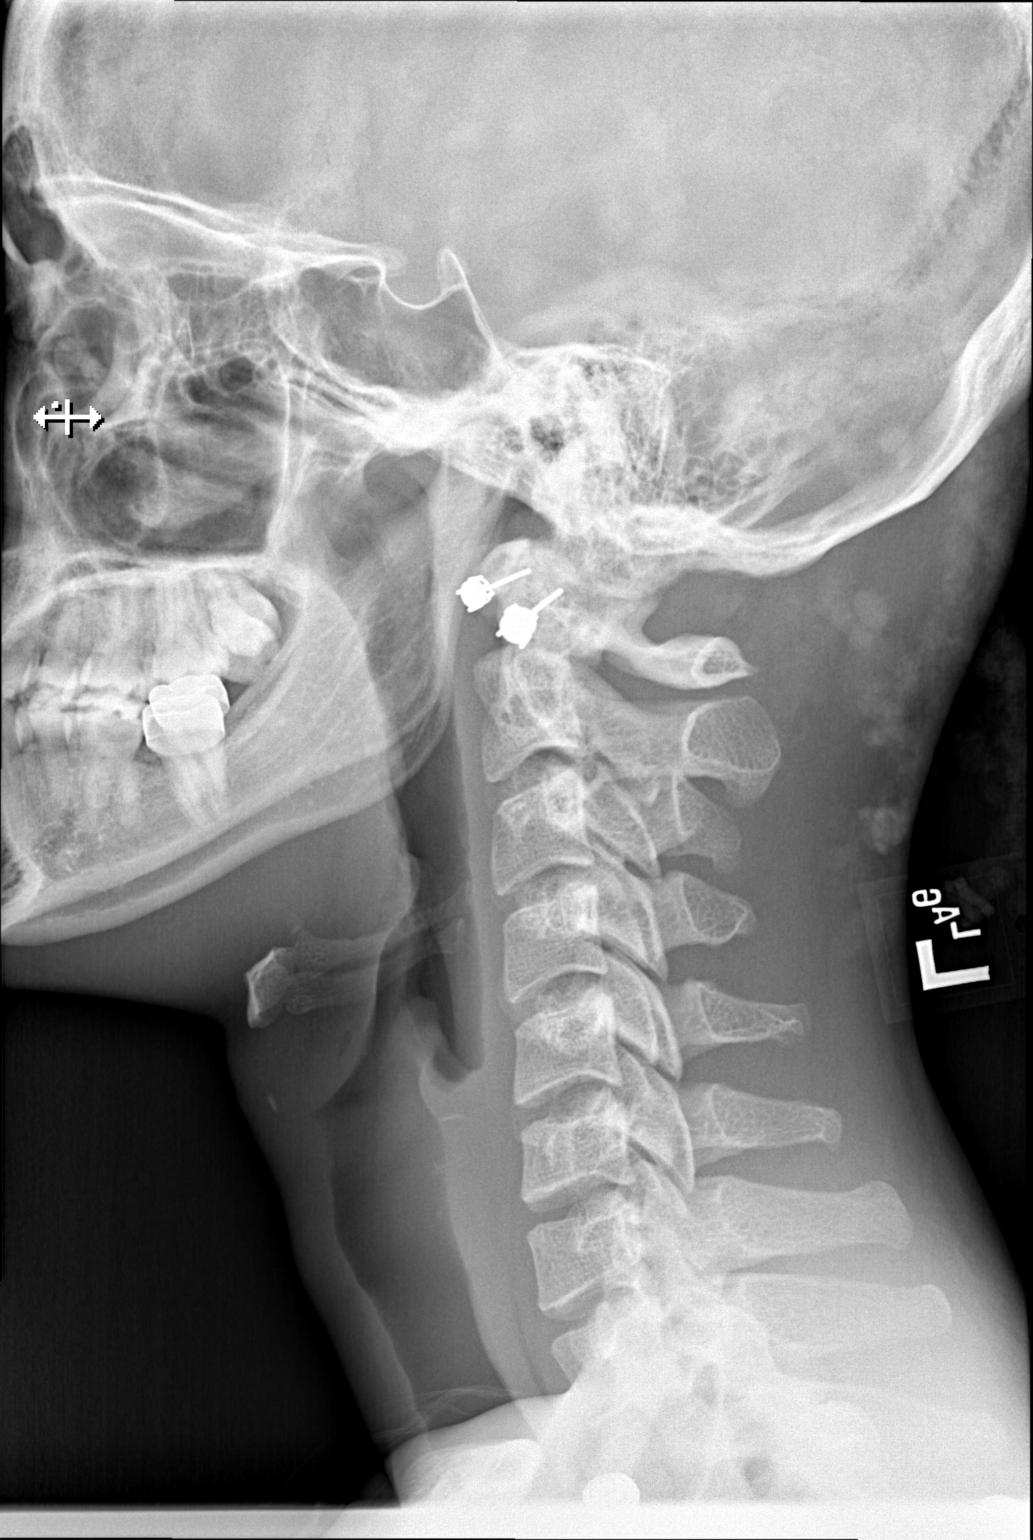

[3 of 3 positions shown; findings below may reference images not displayed]

FINDINGS: There is no evidence of retropharyngeal soft tissue swelling or
epiglottic enlargement. The cervical airway is unremarkable and no
radio-opaque foreign body identified.
IMPRESSION: Negative.

## 2020-07-10 IMAGING — CR DG CHEST 2V
2 series · 2 of 2 positions shown · non-contrast
Comparison: [DATE]

CLINICAL DATA: Shortness of breath.  Concern for aspiration

EXAM:
CHEST - 2 VIEW

[w chest pa]
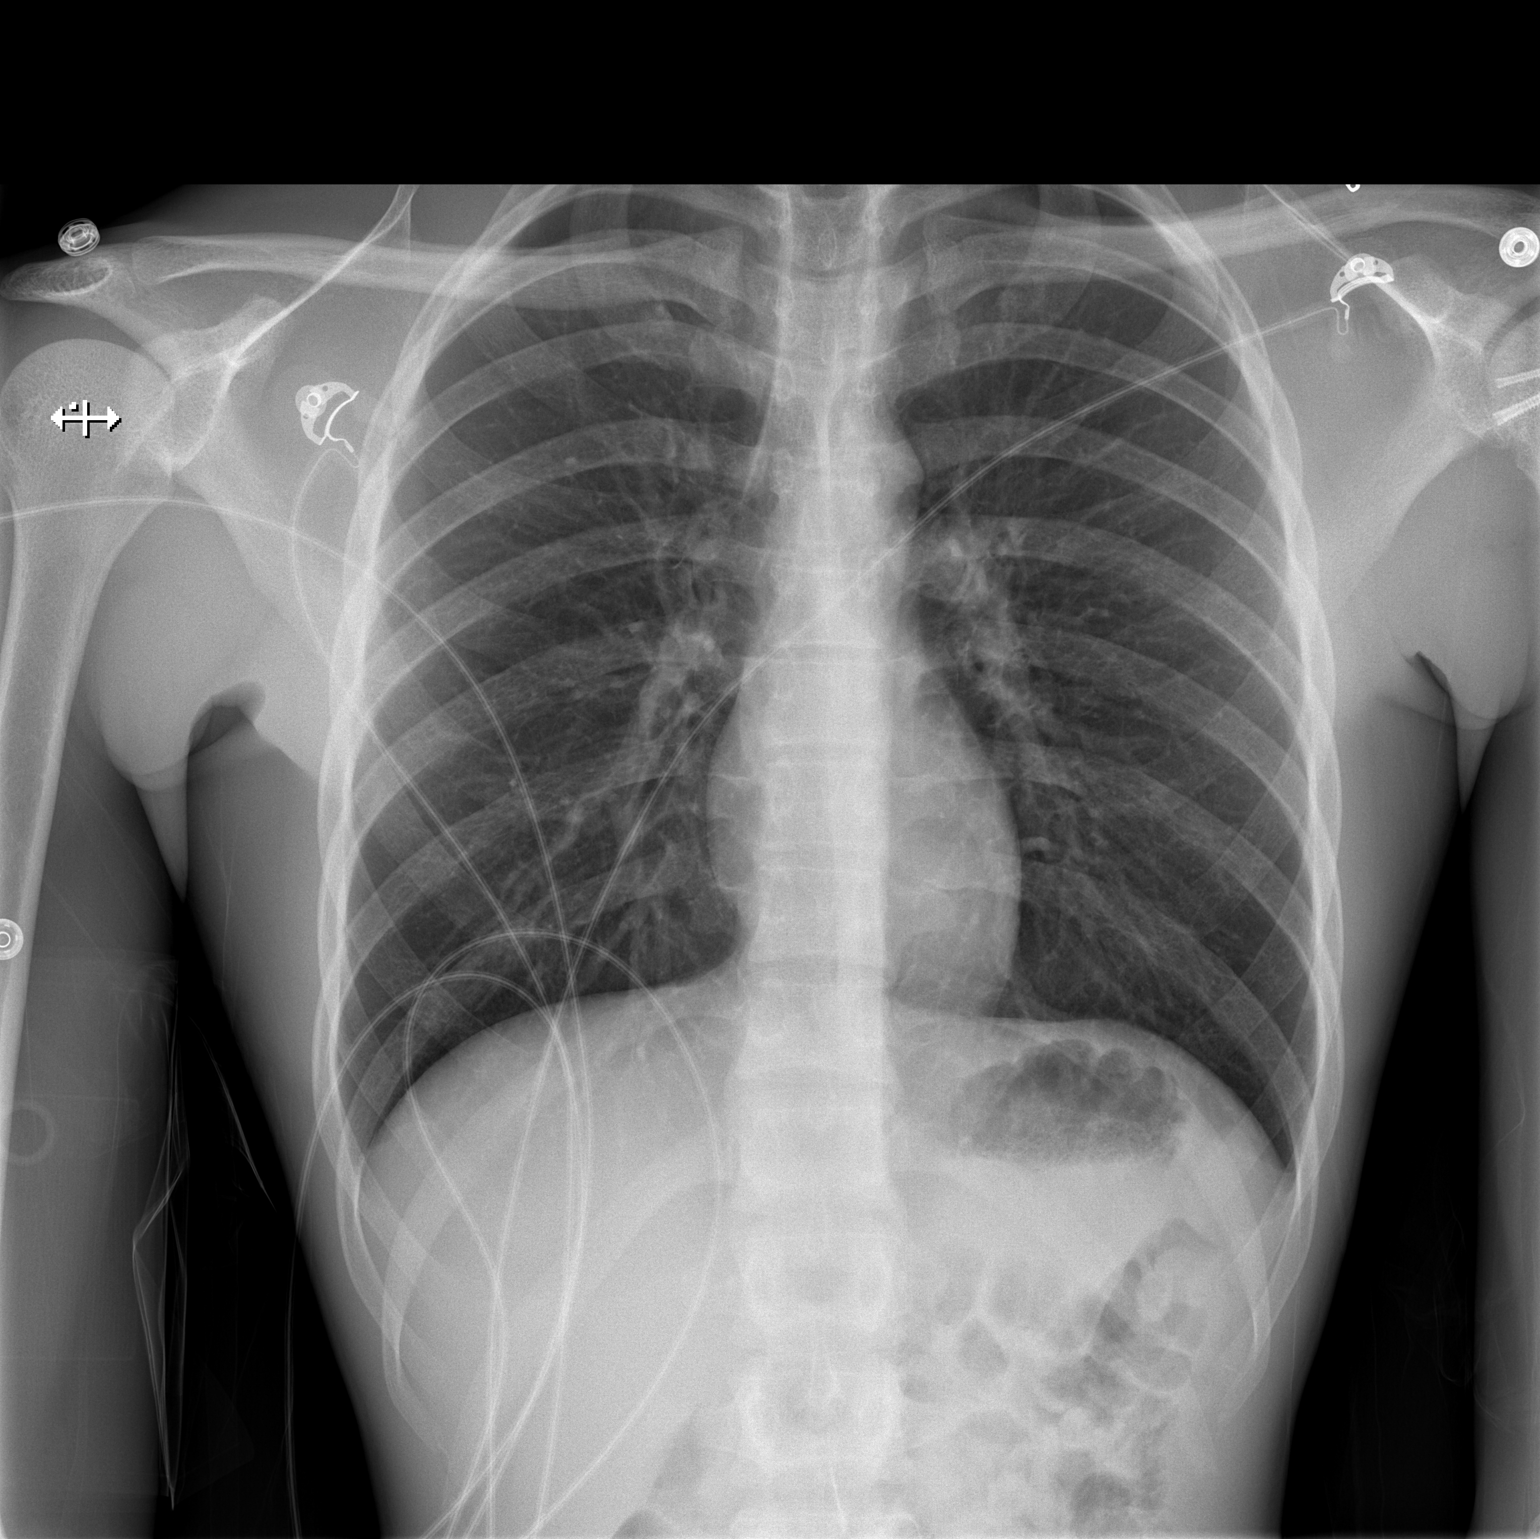

[w chest lat]
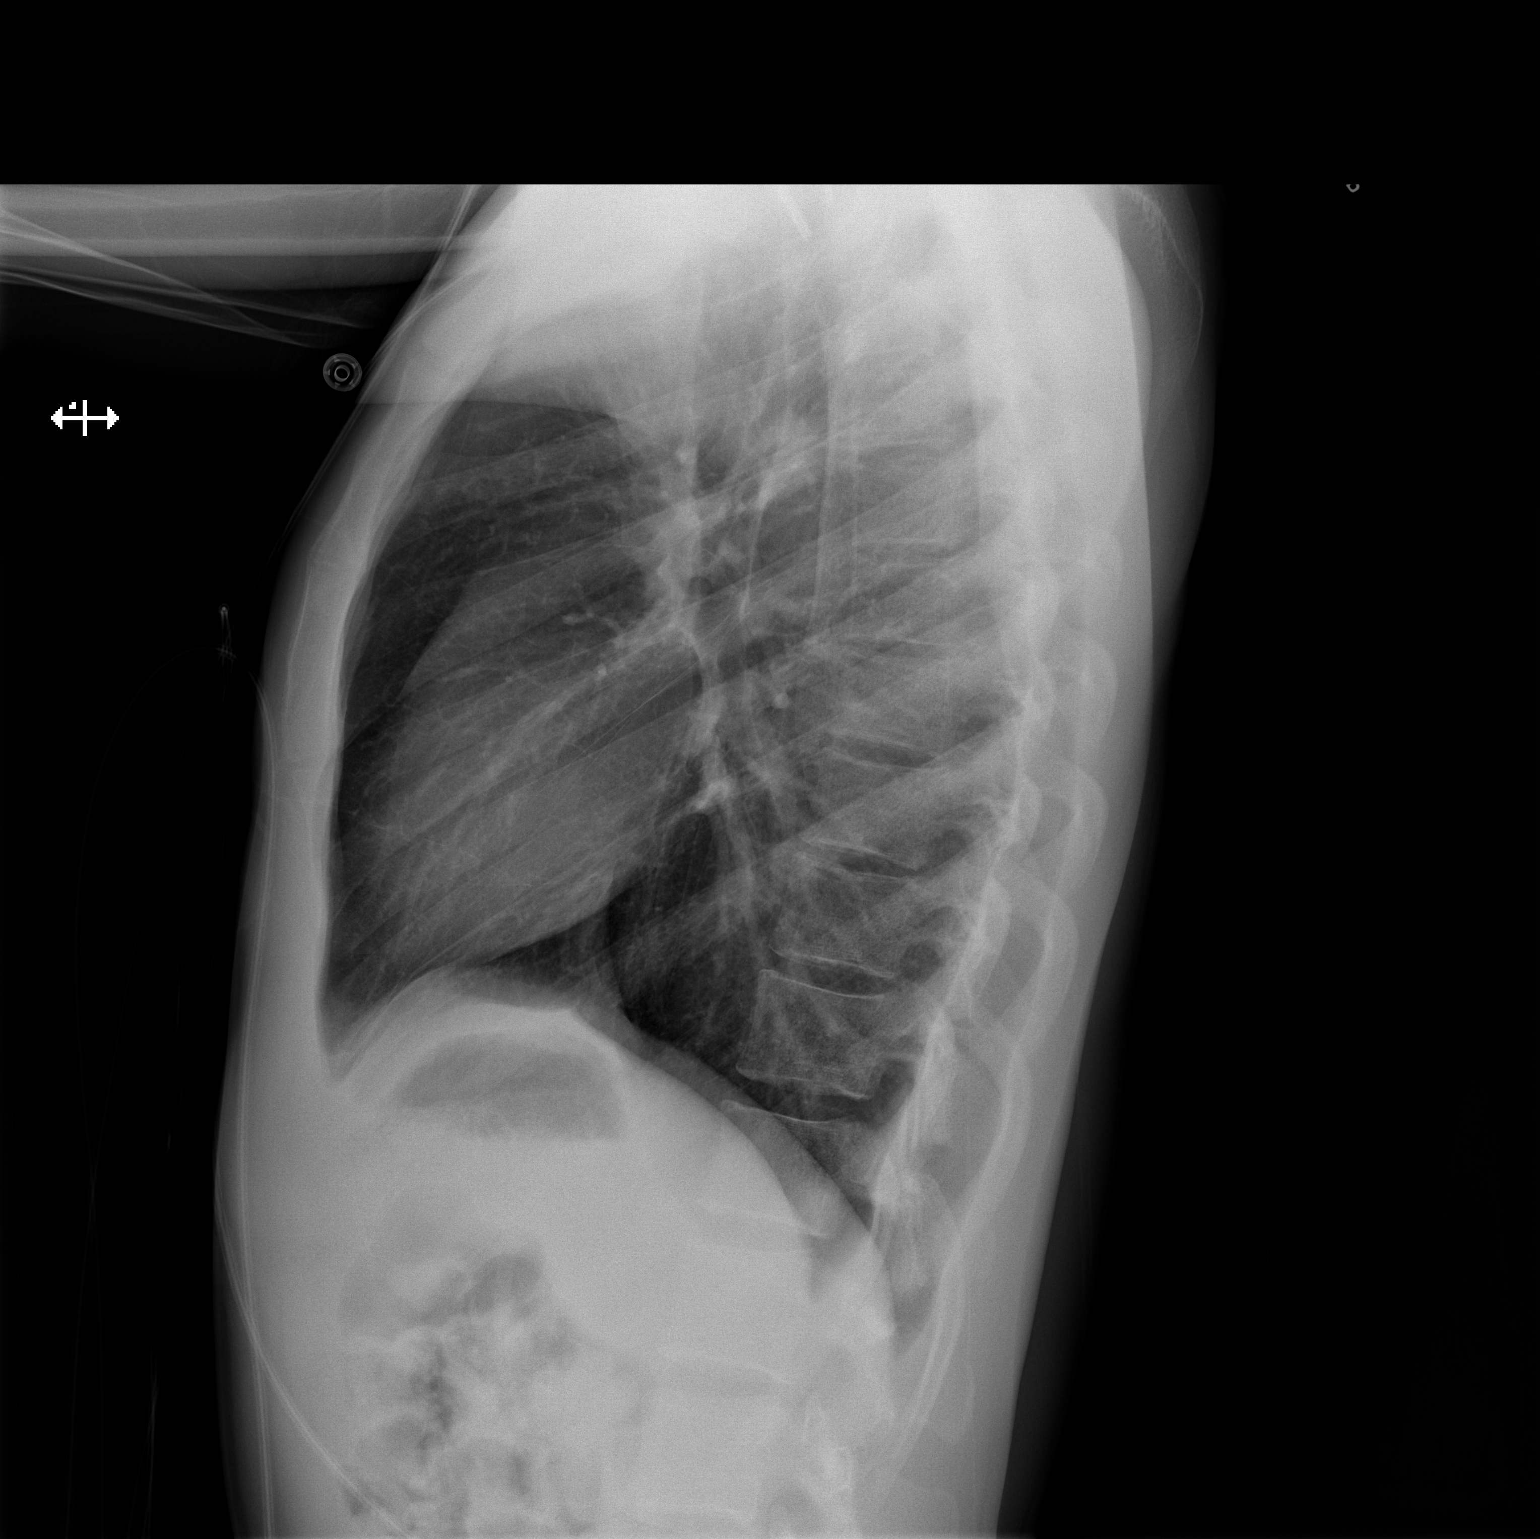

[2 of 2 positions shown; findings below may reference images not displayed]

FINDINGS: The heart size and mediastinal contours are within normal limits.
Both lungs are clear. The visualized skeletal structures are
unremarkable.
IMPRESSION: Negative

## 2020-07-10 MED ORDER — EPINEPHRINE 0.3 MG/0.3ML IJ SOAJ: 0.3000 mg | INTRAMUSCULAR | 0 refills | Status: DC | PRN

## 2020-07-10 MED ORDER — FAMOTIDINE IN NACL 20-0.9 MG/50ML-% IV SOLN
20.0000 mg | Freq: Once | INTRAVENOUS | Status: AC
  Administered 2020-07-10: 20 mg via INTRAVENOUS
  Filled 2020-07-10: qty 50

## 2020-07-10 NOTE — ED Provider Notes (Signed)
[facility] DEPT Provider Note   CSN: [id number] Arrival date & time: 07/09/20  2341     History No chief complaint on file.   Terrence Riley is a 19 y.o. male with history of asthma, seizures, eosinophilic esophagitis, and allergic rhinitis who presents to the emergency department with a chief complaint of anaphylaxis.  The patient reports that he ordered a banana putting milkshake from cookout tonight and began eating it while he was driving home. States that he then felt that his throat was starting to close and he was having difficulty breathing. He was able to drive himself back to his house, but his symptoms continue to worsen and he started crying and became unable to talk due to worsening swelling. He reports associated cough, shortness of breath, and wheezing then developed lightheadedness, nausea, and vomiting x2.  He denies rash, itching, abdominal pain, chest pain, fever, chills, lip or tongue swelling, drooling, confusion, headache, or neck pain.  He reports that he took 50 mg of Benadryl at home with no improvement in his symptoms. He was given Solu-Medrol, 2 doses of epinephrine, 2 DuoNeb treatments by EMS and was transported on a nonrebreather.  He states that he was still unable to talk when he initially arrived to the ER due to feeling as if his throat was closing. He reports that all of his symptoms have now resolved except endorsing globus sensation and is concerned that something from the milkshake is stuck in his throat.  The history is provided by the patient, medical records and the EMS personnel. No language interpreter was used.       Past Medical History:  Diagnosis Date  . Allergy    rhinitis  . Asthma    as a child  . Complication of anesthesia    pt. reports he need more anesthesia with the septoplasty surgery  . Eosinophilic esophagitis    heartburn and reflux  . Family history of adverse reaction to anesthesia     mother respiratory failure  . Nasal fracture    deviated septum  . Pneumonia    1x history of  . Premature baby    2 months early  . Seizures ([place])    well controlled on meds/ one 2 months ago when meds were late    Patient Active Problem List   Diagnosis Date Noted  . Labral tear of shoulder, left, subsequent encounter 02/13/2020  . Shoulder dislocation, left, subsequent encounter 02/13/2020  . Seizure disorder ([place]) 12/22/2019  . Migraines 12/22/2019  . Mild intermittent asthma 12/22/2019    Past Surgical History:  Procedure Laterality Date  . ADENOIDECTOMY    . CLOSED REDUCTION NASAL FRACTURE N/A 08/31/2017   Procedure: CLOSED REDUCTION NASAL FRACTURE;  Surgeon: Clyde Canterbury, MD;  Location: [facility];  Service: ENT;  Laterality: N/A;  . SEPTOPLASTY N/A 08/31/2017   Procedure: SEPTOPLASTY;  Surgeon: Clyde Canterbury, MD;  Location: [facility];  Service: ENT;  Laterality: N/A;  . SHOULDER ARTHROSCOPY WITH LABRAL REPAIR Left 04/30/2020   Procedure: LEFT SHOULDER POSTERIOR LABRAL REPAIR WITH ARTHROSCOPY, BICEPS TENDON RELEASE AND TENODESIS, OPEN ALLOGRAFT FOR REVERSE BANKART LESION;  Surgeon: [name], MD;  Location: [facility];  Service: Orthopedics;  Laterality: Left;  . TONSILLECTOMY     and addenoids       Family History  Problem Relation Age of Onset  . Cancer Mother        breat  . Protein S deficiency Mother  Social History   Tobacco Use  . Smoking status: Never Smoker  . Smokeless tobacco: Never Used  Vaping Use  . Vaping Use: Never used  Substance Use Topics  . Alcohol use: No  . Drug use: No    Home Medications Prior to Admission medications   Medication Sig Start Date End Date Taking? Authorizing Provider  albuterol (PROVENTIL) (2.5 MG/3ML) 0.083% nebulizer solution Take 3 mLs (2.5 mg total) by nebulization every 4 (four) hours as needed for wheezing or shortness of breath. 18/29/93   Delora Fuel, MD  albuterol (VENTOLIN HFA)  108 (90 Base) MCG/ACT inhaler Inhale 2 puffs into the lungs every 4 (four) hours as needed for wheezing. 03/12/20   Burchette, Alinda Sierras, MD  cetirizine (ZYRTEC) 10 MG tablet Take 10 mg by mouth daily as needed for allergies.     [provider]  Cholecalciferol (VITAMIN D-1000 MAX ST) 25 MCG (1000 UT) tablet Take 1,000 Units by mouth daily.  06/13/18   [provider]  clonazePAM (KLONOPIN) 1 MG tablet Take 0.5 mg by mouth 2 (two) times daily as needed for anxiety.     [provider]  EPINEPHrine 0.3 mg/0.3 mL IJ SOAJ injection Inject 0.3 mg into the muscle as needed for anaphylaxis. 07/10/20   Tripp Goins A, PA-C  levETIRAcetam (KEPPRA) 1000 MG tablet Take 2,000 mg by mouth 2 (two) times daily.     [provider]  lidocaine (LIDODERM) 5 % Place 1 patch onto the skin daily. Remove & Discard patch within 12 hours or as directed by MD Patient taking differently: Place 1 patch onto the skin daily as needed (pain). Remove & Discard patch within 12 hours or as directed by MD 02/25/20   Henderly, Britni A, PA-C  methocarbamol (ROBAXIN) 500 MG tablet Take 1 tablet (500 mg total) by mouth 2 (two) times daily. 02/25/20   Henderly, Britni A, PA-C  midazolam (VERSED) 5 MG/ML injection Place 2 mLs into the nose every 5 (five) minutes as needed (seizures). Draw up prescribed dose (ml) in syringe, remove blue vial access device, then attach syringe to nasal atomizer for intranasal administration.     [provider]  naproxen (NAPROSYN) 500 MG tablet Take 1 tablet (500 mg total) by mouth 2 (two) times daily. 02/25/20   Henderly, Britni A, PA-C  oxyCODONE (ROXICODONE) 5 MG immediate release tablet Take 1 tablet (5 mg total) by mouth every 6 (six) hours as needed. Patient not taking: Reported on 06/24/2020 04/30/20 04/30/21  Magnant, Gerrianne Scale, PA-C  PRESCRIPTION MEDICATION Apply 1 application topically 3 (three) times daily as needed. Nausea - Phenergan Cream- Patient not  taking: Reported on 04/22/2020    [provider]  topiramate (TOPAMAX) 200 MG tablet Take 200 mg by mouth 2 (two) times daily.     [provider]    Allergies    Lortab [hydrocodone-acetaminophen], Other, Zofran [ondansetron hcl], Citrus, Dairy aid [lactase], Flu virus vaccine, Soy allergy, Tylenol [acetaminophen], Eggs or egg-derived products, and Hydrocodone-acetaminophen  Review of Systems   Review of Systems  Constitutional: Negative for appetite change, chills, diaphoresis and fever.  HENT: Positive for sore throat and trouble swallowing. Negative for congestion, dental problem, drooling, mouth sores, postnasal drip, sinus pressure and sinus pain.   Eyes: Negative for visual disturbance.  Respiratory: Positive for cough, shortness of breath and wheezing.   Cardiovascular: Negative for chest pain, palpitations and leg swelling.  Gastrointestinal: Positive for nausea and vomiting. Negative for abdominal pain.  Genitourinary:  Negative for dysuria.  Musculoskeletal: Negative for back pain, myalgias, neck pain and neck stiffness.  Skin: Negative for rash and wound.  Allergic/Immunologic: Negative for immunocompromised state.  Neurological: Positive for light-headedness. Negative for weakness and headaches.  Psychiatric/Behavioral: Negative for confusion.    Physical Exam Updated Vital Signs BP 133/77 (BP Location: Right Arm)   Pulse 70   Temp 98.2 F (36.8 C) (Oral)   Resp 18   Ht 6' (1.829 m)   Wt 59 kg   SpO2 100%   BMI 17.63 kg/m   Physical Exam Vitals and nursing note reviewed.  Constitutional:      General: He is not in acute distress.    Appearance: He is well-developed. He is not ill-appearing, toxic-appearing or diaphoretic.  HENT:     Head: Normocephalic.     Nose: Nose normal. No congestion or rhinorrhea.     Mouth/Throat:     Mouth: Mucous membranes are moist.     Pharynx: No oropharyngeal exudate or posterior oropharyngeal erythema.      Comments: Phonation is normal. No sublingual edema. No edema of the lips. Uvula is midline. Posterior oropharynx is patent. Eyes:     General: No scleral icterus.    Extraocular Movements: Extraocular movements intact.     Conjunctiva/sclera: Conjunctivae normal.     Pupils: Pupils are equal, round, and reactive to light.  Cardiovascular:     Rate and Rhythm: Normal rate and regular rhythm.     Pulses: Normal pulses.     Heart sounds: Normal heart sounds. No murmur heard. No friction rub. No gallop.   Pulmonary:     Effort: Pulmonary effort is normal. No respiratory distress.     Breath sounds: No stridor. No wheezing, rhonchi or rales.     Comments: Lungs are clear to auscultation bilaterally. Patient is able to speak in complete, fluent sentences without increased work of breathing. Chest:     Chest wall: No tenderness.  Abdominal:     General: There is no distension.     Palpations: Abdomen is soft. There is no mass.     Tenderness: There is no abdominal tenderness. There is no right CVA tenderness, left CVA tenderness, guarding or rebound.     Hernia: No hernia is present.  Musculoskeletal:     Cervical back: Normal range of motion and neck supple.  Skin:    General: Skin is warm and dry.     Coloration: Skin is not pale.     Findings: No rash.     Comments: No rashes  Neurological:     Mental Status: He is alert.  Psychiatric:        Behavior: Behavior normal.     ED Results / Procedures / Treatments   Labs (all labs ordered are listed, but only abnormal results are displayed) Labs Reviewed - No data to display  EKG None  Radiology DG Neck Soft Tissue  Result Date: 07/10/2020 CLINICAL DATA:  Globus sensation EXAM: NECK SOFT TISSUES - 1+ VIEW COMPARISON:  None. FINDINGS: There is no evidence of retropharyngeal soft tissue swelling or epiglottic enlargement. The cervical airway is unremarkable and no radio-opaque foreign body identified. IMPRESSION: Negative.  Electronically Signed   By: Rolm Baptise M.D.   On: 07/10/2020 01:07   DG Chest 2 View  Result Date: 07/10/2020 CLINICAL DATA:  Shortness of breath.  Concern for aspiration EXAM: CHEST - 2 VIEW COMPARISON:  09/11/2019 FINDINGS: The heart size and mediastinal contours are within normal  limits. Both lungs are clear. The visualized skeletal structures are unremarkable. IMPRESSION: Negative Electronically Signed   By: Rolm Baptise M.D.   On: 07/10/2020 01:07    Procedures .Critical Care Performed by: Joanne Gavel, PA-C Authorized by: Joanne Gavel, PA-C   Critical care provider statement:    Critical care time (minutes):  40   Critical care time was exclusive of:  Separately billable procedures and treating other patients and teaching time   Critical care was necessary to treat or prevent imminent or life-threatening deterioration of the following conditions: Anaphylaxis.   Critical care was time spent personally by me on the following activities:  Review of old charts, re-evaluation of patient's condition, pulse oximetry, ordering and review of radiographic studies, ordering and performing treatments and interventions, obtaining history from patient or surrogate, examination of patient, evaluation of patient's response to treatment and development of treatment plan with patient or surrogate   I assumed direction of critical care for this patient from another provider in my specialty: no     (including critical care time)  Medications Ordered in ED Medications  famotidine (PEPCID) IVPB 20 mg premix (0 mg Intravenous Stopped 07/10/20 0200)    ED Course  I have reviewed the triage vital signs and the nursing notes.  Pertinent labs & imaging results that were available during my care of the patient were reviewed by me and considered in my medical decision making (see chart for details).    MDM Rules/Calculators/A&P                          19 year old male with history of asthma,  seizures, eosinophilic esophagitis, and allergic rhinitis who presents the emergency department by EMS with shortness of breath, wheezing, lightheadedness, throat closing, nausea, and vomiting after eating a milkshake that contained peanuts.  He was given Solu-Medrol, epinephrine x2, DuoNeb x2 with EMS intact Benadryl p.o. prior to arrival. Differential diagnosis includes allergic reaction, esophageal impaction, asthma exacerbation, PE, intra-abdominal infection, or ACS.  On my evaluation, he is able to speak in complete, fluent sentences without increased work of breathing. Lungs are clear to auscultation bilaterally. He has no urticaria. He was transported on a nonrebreather, but now is on room air. He has no drooling. Phonation is normal.  Strongly suspect anaphylaxis. He did express concern for globus sensation and given his symptoms with vomiting, there is concern for aspiration from vomiting versus esophageal impaction. However, the symptoms could be residual from anaphylaxis.  Images were reviewed and independently interpreted by me. X-rays are unremarkable.  The patient was observed for 4 hours and has had no refractory symptoms. He reports that he is feeling much improved and would like to be discharged home. We will provide the patient with a new prescription for an EpiPen as his home prescription is expired. He was counseled on EpiPen use. He was strongly advised to avoid products containing peanuts. He can follow-up with primary care for reevaluation and may need a referral to an immunologist. ER return precautions given. He is hemodynamically stable no acute distress. Safe for discharge home with outpatient follow-up as indicated.  Final Clinical Impression(s) / ED Diagnoses Final diagnoses:  Anaphylaxis, initial encounter    Rx / DC Orders ED Discharge Orders         Ordered    EPINEPHrine 0.3 mg/0.3 mL IJ SOAJ injection  As needed        07/10/20 0334  Joanne Gavel, PA-C [birth year] 6701    Delora Fuel, MD 41/03/01 [phone]

## 2020-07-10 NOTE — Discharge Instructions (Addendum)
Thank you for allowing me to care for you today in the Emergency Department.   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  Please be diligent about avoiding food products that may contain peanuts.  I have refilled your EpiPen and sent it to your pharmacy.  If you have to administer your EpiPen at home, you need to come to the ER for further evaluation.  If you develop itching or rash at home, you can take Benadryl or Pepcid, both of which are available over-the-counter.  Return to the emergency department if you develop difficulty breathing, throat closing, lip or tongue swelling, if you pass out, or develop other new, concerning symptoms.

## 2020-07-11 ENCOUNTER — Other Ambulatory Visit: Payer: Self-pay

## 2020-07-11 ENCOUNTER — Encounter: Payer: Self-pay | Admitting: Physical Therapy

## 2020-07-11 ENCOUNTER — Ambulatory Visit (INDEPENDENT_AMBULATORY_CARE_PROVIDER_SITE_OTHER): Payer: Commercial Managed Care - PPO | Admitting: Physical Therapy

## 2020-07-11 DIAGNOSIS — M25612 Stiffness of left shoulder, not elsewhere classified: Secondary | ICD-10-CM

## 2020-07-11 DIAGNOSIS — R293 Abnormal posture: Secondary | ICD-10-CM

## 2020-07-11 DIAGNOSIS — M6281 Muscle weakness (generalized): Secondary | ICD-10-CM | POA: Diagnosis not present

## 2020-07-11 NOTE — Patient Instructions (Signed)
Access Code: [email or web address] URL: [email or web address] Date: 07/11/2020 Prepared by: Faustino Congress  Exercises Supine Shoulder Flexion with Dowel - 2-3 x daily - 7 x weekly - 1 sets - 20 reps - 5-10 hold Supine Chest Stretch with Elbows Bent - 2-3 x daily - 7 x weekly - 1 sets - 10 reps - 10 sec hold Standing Shoulder Abduction ROM with Dowel - 2-3 x daily - 7 x weekly - 1 sets - 20 reps - 5-10 sec hold Standing Shoulder Internal Rotation Stretch with Dowel - 2-3 x daily - 7 x weekly - 1 sets - 5 reps - 20 sec hold Standing Shoulder Flexion to 90 Degrees with Dumbbells - 1 x daily - 7 x weekly - 3 sets - 10 reps Standing Single Arm Shoulder Abduction with Dumbbell - Thumb Up - 1 x daily - 7 x weekly - 3 sets - 10 reps Shoulder External Rotation with Anchored Resistance - 1 x daily - 7 x weekly - 3 sets - 10 reps Shoulder Internal Rotation with Resistance - 1 x daily - 7 x weekly - 10 reps - 1 sets

## 2020-07-11 NOTE — Therapy (Signed)
[facility] [address] [place], [place], [address] Phone: [phone]   Fax:  [phone]  Physical Therapy Treatment/Recertification  Patient Details  Name: Terrence Riley MRN: [id number] Date of Birth: [birth year] Referring Provider (PT): [name], MD   Encounter Date: 07/11/2020   PT End of Session - 07/11/20 1426    Visit Number 2    Number of Visits 18    Date for PT Re-Evaluation 09/05/20    PT Start Time 2297    PT Stop Time 1425    PT Time Calculation (min) 40 min    Activity Tolerance Patient tolerated treatment well    Behavior During Therapy [facility] for tasks assessed/performed           Past Medical History:  Diagnosis Date   Allergy    rhinitis   Asthma    as a child   Complication of anesthesia    pt. reports he need more anesthesia with the septoplasty surgery   Eosinophilic esophagitis    heartburn and reflux   Family history of adverse reaction to anesthesia    mother respiratory failure   Nasal fracture    deviated septum   Pneumonia    1x history of   Premature baby    2 months early   Seizures ([place])    well controlled on meds/ one 2 months ago when meds were late    Past Surgical History:  Procedure Laterality Date   ADENOIDECTOMY     CLOSED REDUCTION NASAL FRACTURE N/A 08/31/2017   Procedure: CLOSED REDUCTION NASAL FRACTURE;  Surgeon: Clyde Canterbury, MD;  Location: [place];  Service: ENT;  Laterality: N/A;   SEPTOPLASTY N/A 08/31/2017   Procedure: SEPTOPLASTY;  Surgeon: Clyde Canterbury, MD;  Location: [place];  Service: ENT;  Laterality: N/A;   SHOULDER ARTHROSCOPY WITH LABRAL REPAIR Left 04/30/2020   Procedure: LEFT SHOULDER POSTERIOR LABRAL REPAIR WITH ARTHROSCOPY, BICEPS TENDON RELEASE AND TENODESIS, OPEN ALLOGRAFT FOR REVERSE BANKART LESION;  Surgeon: [name], MD;  Location: [place];  Service: Orthopedics;  Laterality: Left;   TONSILLECTOMY      and addenoids    There were no vitals filed for this visit.   Subjective Assessment - 07/11/20 1347    Subjective MD pleased with progress, able to begin strengthening now    Patient Stated Goals basketball showcase on 12/4 - wants to play    Currently in Pain? No/denies              [facility] PT Assessment - 07/11/20 1348      Assessment   Medical Diagnosis S43.432D (ICD-10-CM) - Labral tear of shoulder, left, subsequent encounter    Referring Provider (PT) [name], MD    Onset Date/Surgical Date 04/30/20    Next MD Visit 08/22/19    Prior Therapy none      AROM   AROM Assessment Site Shoulder    Right/Left Shoulder Left    Left Shoulder Flexion 122 Degrees    Left Shoulder ABduction 99 Degrees    Left Shoulder Internal Rotation --   T12/L1                        [facility] Adult PT Treatment/Exercise - 07/11/20 1359      Exercises   Exercises Shoulder      Shoulder Exercises: Supine   External Rotation PROM;10 reps;Both   10 sec hold   Flexion AAROM;20 reps  3# bar; 10 sec hold     Shoulder Exercises: Standing   External Rotation Left;Theraband   3x10   Theraband Level (Shoulder External Rotation) Level 3 (Green)    Internal Rotation Left;Theraband   3x10   Theraband Level (Shoulder Internal Rotation) Level 3 (Green)    ABduction AAROM;Left;20 reps   3# bar; 10 sec hold   Other Standing Exercises standing flexion (2#, 3#)/abduction (2#) on Lt 3x10      Shoulder Exercises: Stretch   Internal Rotation Stretch Limitations 5x20 sec; Left                  PT Education - 07/11/20 1426    Education Details HEP    Person(s) Educated Patient    Methods Explanation;Demonstration;Handout    Comprehension Verbalized understanding;Returned demonstration;Need further instruction            PT Short Term Goals - 07/11/20 1426      PT SHORT TERM GOAL #1   Title independent with initial HEP    Baseline 12/16: updated today to progress  AROM/strengthening    Status On-going    Target Date 07/22/20             PT Long Term Goals - 07/11/20 1427      PT LONG TERM GOAL #1   Title Independent with HEP    Status On-going    Target Date 09/06/19      PT LONG TERM GOAL #2   Title Improve Lt shoulder AROM to WNL for improved function    Status On-going      PT LONG TERM GOAL #3   Title FOTO score improved to 82    Status On-going      PT LONG TERM GOAL #4   Title Demonstrate 5/5 Lt shoulder strength for improved function    Status On-going                 Plan - 07/11/20 1427    Clinical Impression Statement Pt returns to PT now able to progress AROM and strengthening.  AROM limited all planes and demonstrating decreased strength in all Lt shoulder motions.  Will continue to benefit from PT to maximize function.    Personal Factors and Comorbidities Age;Behavior Pattern;Comorbidity 1    Comorbidities seizures, reports not following MD precautions/recommendations    Examination-Activity Limitations Lift;Reach Overhead    Examination-Participation Restrictions Community Activity;School;Other   sports   Stability/Clinical Decision Making Evolving/Moderate complexity    Rehab Potential Good    PT Frequency 2x / week   1-2x/wk x 8 weeks; no follow up until after MD appt and can progress activity   PT Duration 8 weeks    PT Treatment/Interventions ADLs/Self Care Home Management;Cryotherapy;Electrical Stimulation;Moist Heat;Therapeutic exercise;Therapeutic activities;Ultrasound;Patient/family education;Manual techniques;Taping;Dry needling;Passive range of motion    PT Next Visit Plan review new HEP and progress strength, focus on end range, BFR    PT Home Exercise Plan Access Code: [email or web address]    Consulted and Agree with Plan of Care Patient           Patient will benefit from skilled therapeutic intervention in order to improve the following deficits and impairments:  Decreased strength,Postural  dysfunction,Decreased range of motion,Decreased knowledge of precautions  Visit Diagnosis: Stiffness of left shoulder, not elsewhere classified - Plan: PT plan of care cert/re-cert  Muscle weakness (generalized) - Plan: PT plan of care cert/re-cert  Abnormal posture - Plan: PT plan of care cert/re-cert     Problem List  Patient Active Problem List   Diagnosis Date Noted   Labral tear of shoulder, left, subsequent encounter 02/13/2020   Shoulder dislocation, left, subsequent encounter 02/13/2020   Seizure disorder ([place]) 12/22/2019   Migraines 12/22/2019   Mild intermittent asthma 12/22/2019      Laureen Abrahams, PT, DPT 07/11/20 2:31 PM    [facility] [address] [place], [place], [address] Phone: [phone]   Fax:  [phone]  Name: Terrence Riley MRN: [id number] Date of Birth: Jan 01, 2001

## 2020-07-13 ENCOUNTER — Encounter: Payer: Self-pay | Admitting: Orthopedic Surgery

## 2020-07-13 NOTE — Progress Notes (Signed)
Post-Op Visit Note   Patient: Terrence Riley           Date of Birth: March 02, 2001           MRN: [id number] Visit Date: 07/10/2020 PCP: Eulas Post, MD   Assessment & Plan:  Chief Complaint: No chief complaint on file.  Visit Diagnoses:  1. Labral tear of shoulder, left, subsequent encounter     Plan: Patient is a 19 year old male who presents s/p left shoulder posterior labral repair on 04/30/2020.  He continues to progress well.  He is going to physical therapy upstairs and return to physical therapy tomorrow.  He is doing a home exercise program on days when he does not go to therapy.  On exam he is 40 degrees external rotation, 70 degrees abduction, 120 degrees forward flexion.  Incisions are healing well.  Excellent stability anterior/posterior of the left shoulder.  Provided note for physical therapy stating patient is okay to return for active range of motion and rotator cuff strengthening with home exercise program.  Work note provided so that patient may return to work at [employer] and [employer].  Cautioned patient against returning to any significant heavy lifting or any sport exercises.  Patient is disappointed that basketball season starts in 3 weeks and he will have to miss this but definitely better in the long run for him and his ability to play basketball long-term if he takes it slow and gives the shoulder a full chance at healing.  He understands.  Follow-up in 6 weeks.  Follow-Up Instructions: No follow-ups on file.   Orders:  Orders Placed This Encounter  Procedures  . Ambulatory referral to Physical Therapy   No orders of the defined types were placed in this encounter.   Imaging: No results found.  PMFS History: Patient Active Problem List   Diagnosis Date Noted  . Labral tear of shoulder, left, subsequent encounter 02/13/2020  . Shoulder dislocation, left, subsequent encounter 02/13/2020  . Seizure disorder ([place]) 12/22/2019  . Migraines  12/22/2019  . Mild intermittent asthma 12/22/2019   Past Medical History:  Diagnosis Date  . Allergy    rhinitis  . Asthma    as a child  . Complication of anesthesia    pt. reports he need more anesthesia with the septoplasty surgery  . Eosinophilic esophagitis    heartburn and reflux  . Family history of adverse reaction to anesthesia    mother respiratory failure  . Nasal fracture    deviated septum  . Pneumonia    1x history of  . Premature baby    2 months early  . Seizures ([place])    well controlled on meds/ one 2 months ago when meds were late    Family History  Problem Relation Age of Onset  . Cancer Mother        breat  . Protein S deficiency Mother     Past Surgical History:  Procedure Laterality Date  . ADENOIDECTOMY    . CLOSED REDUCTION NASAL FRACTURE N/A 08/31/2017   Procedure: CLOSED REDUCTION NASAL FRACTURE;  Surgeon: Clyde Canterbury, MD;  Location: [place];  Service: ENT;  Laterality: N/A;  . SEPTOPLASTY N/A 08/31/2017   Procedure: SEPTOPLASTY;  Surgeon: Clyde Canterbury, MD;  Location: [place];  Service: ENT;  Laterality: N/A;  . SHOULDER ARTHROSCOPY WITH LABRAL REPAIR Left 04/30/2020   Procedure: LEFT SHOULDER POSTERIOR LABRAL REPAIR WITH ARTHROSCOPY, BICEPS TENDON RELEASE AND TENODESIS, OPEN ALLOGRAFT FOR REVERSE  BANKART LESION;  Surgeon: [name], MD;  Location: [place];  Service: Orthopedics;  Laterality: Left;  . TONSILLECTOMY     and addenoids   Social History   Occupational History  . Not on file  Tobacco Use  . Smoking status: Never Smoker  . Smokeless tobacco: Never Used  Vaping Use  . Vaping Use: Never used  Substance and Sexual Activity  . Alcohol use: No  . Drug use: No  . Sexual activity: Not on file

## 2020-07-18 ENCOUNTER — Other Ambulatory Visit: Payer: Self-pay

## 2020-07-18 ENCOUNTER — Encounter: Payer: Self-pay | Admitting: Rehabilitative and Restorative Service Providers"

## 2020-07-18 ENCOUNTER — Ambulatory Visit (INDEPENDENT_AMBULATORY_CARE_PROVIDER_SITE_OTHER): Payer: Commercial Managed Care - PPO | Admitting: Rehabilitative and Restorative Service Providers"

## 2020-07-18 DIAGNOSIS — R293 Abnormal posture: Secondary | ICD-10-CM

## 2020-07-18 DIAGNOSIS — M6281 Muscle weakness (generalized): Secondary | ICD-10-CM | POA: Diagnosis not present

## 2020-07-18 DIAGNOSIS — M25612 Stiffness of left shoulder, not elsewhere classified: Secondary | ICD-10-CM

## 2020-07-18 NOTE — Therapy (Signed)
[facility] [place], [place], [address] Phone: [phone]   Fax:  [phone]  Physical Therapy Treatment  Patient Details  Name: Terrence Riley MRN: [id number] Date of Birth: 2001/07/14 Referring Provider (PT): [name], MD   Encounter Date: 07/18/2020   PT End of Session - 07/18/20 1340    Visit Number 3    Number of Visits 18    Date for PT Re-Evaluation 09/05/20    Progress Note Due on Visit 10    PT Start Time 1340    PT Stop Time 1420    PT Time Calculation (min) 40 min    Activity Tolerance Patient tolerated treatment well    Behavior During Therapy [facility] for tasks assessed/performed           Past Medical History:  Diagnosis Date  . Allergy    rhinitis  . Asthma    as a child  . Complication of anesthesia    pt. reports he need more anesthesia with the septoplasty surgery  . Eosinophilic esophagitis    heartburn and reflux  . Family history of adverse reaction to anesthesia    mother respiratory failure  . Nasal fracture    deviated septum  . Pneumonia    1x history of  . Premature baby    2 months early  . Seizures ([place])    well controlled on meds/ one 2 months ago when meds were late    Past Surgical History:  Procedure Laterality Date  . ADENOIDECTOMY    . CLOSED REDUCTION NASAL FRACTURE N/A 08/31/2017   Procedure: CLOSED REDUCTION NASAL FRACTURE;  Surgeon: Clyde Canterbury, MD;  Location: [place];  Service: ENT;  Laterality: N/A;  . SEPTOPLASTY N/A 08/31/2017   Procedure: SEPTOPLASTY;  Surgeon: Clyde Canterbury, MD;  Location: [place];  Service: ENT;  Laterality: N/A;  . SHOULDER ARTHROSCOPY WITH LABRAL REPAIR Left 04/30/2020   Procedure: LEFT SHOULDER POSTERIOR LABRAL REPAIR WITH ARTHROSCOPY, BICEPS TENDON RELEASE AND TENODESIS, OPEN ALLOGRAFT FOR REVERSE BANKART LESION;  Surgeon: [name], MD;  Location: [place];  Service: Orthopedics;  Laterality: Left;  .  TONSILLECTOMY     and addenoids    There were no vitals filed for this visit.   Subjective Assessment - 07/18/20 1344    Subjective Pt. stated no pain complaints to report or sorneess increase after last visit.    Patient Stated Goals basketball showcase on 12/4 - wants to play    Currently in Pain? No/denies                             [facility] Adult PT Treatment/Exercise - 07/18/20 0001      Shoulder Exercises: Prone   Other Prone Exercises prone scaption over ball, horizontal abd over ball bilateral 2 x 15 each, 2 lbs    Other Prone Exercises qruped SA press Single arm 5 sec hold x 15 bilateral      Shoulder Exercises: Sidelying   Other Sidelying Exercises ER reactive ball catches 2 lbs 30 sec x 5      Shoulder Exercises: Standing   External Rotation Left   3 x 10   Theraband Level (Shoulder External Rotation) Level 4 (Blue)    Internal Rotation Left   3 x 10   Theraband Level (Shoulder Internal Rotation) Level 4 (Blue)    Other Standing Exercises standing wall slide scaption c lift off  x 15      Shoulder Exercises: ROM/Strengthening   UBE (Upper Arm Bike) Lvl 3.5 3 mins fwd/back each way c 10 intervals each min at quicker pace                    PT Short Term Goals - 07/11/20 1426      PT SHORT TERM GOAL #1   Title independent with initial HEP    Baseline 12/16: updated today to progress AROM/strengthening    Status On-going    Target Date 07/22/20             PT Long Term Goals - 07/11/20 1427      PT LONG TERM GOAL #1   Title Independent with HEP    Status On-going    Target Date 09/06/19      PT LONG TERM GOAL #2   Title Improve Lt shoulder AROM to WNL for improved function    Status On-going      PT LONG TERM GOAL #3   Title FOTO score improved to 82    Status On-going      PT LONG TERM GOAL #4   Title Demonstrate 5/5 Lt shoulder strength for improved function    Status On-going                 Plan - 07/18/20  1410    Clinical Impression Statement Scapular musculature control and endurance lacking on Lt compared to Rt.  May continue to benefit from skilled PT services for improved strength, control and overall mobility to facilitate return to full PLOF.    Personal Factors and Comorbidities Age;Behavior Pattern;Comorbidity 1    Comorbidities seizures, reports not following MD precautions/recommendations    Examination-Activity Limitations Lift;Reach Overhead    Examination-Participation Restrictions Community Activity;School;Other   sports   Stability/Clinical Decision Making Evolving/Moderate complexity    Rehab Potential Good    PT Frequency 2x / week   1-2x/wk x 8 weeks; no follow up until after MD appt and can progress activity   PT Duration 8 weeks    PT Treatment/Interventions ADLs/Self Care Home Management;Cryotherapy;Electrical Stimulation;Moist Heat;Therapeutic exercise;Therapeutic activities;Ultrasound;Patient/family education;Manual techniques;Taping;Dry needling;Passive range of motion    PT Next Visit Plan progress strength, focus on end range, BFR intro next visit    PT Home Exercise Plan Access Code: [email or web address]    Consulted and Agree with Plan of Care Patient           Patient will benefit from skilled therapeutic intervention in order to improve the following deficits and impairments:  Decreased strength,Postural dysfunction,Decreased range of motion,Decreased knowledge of precautions  Visit Diagnosis: Stiffness of left shoulder, not elsewhere classified  Muscle weakness (generalized)  Abnormal posture     Problem List Patient Active Problem List   Diagnosis Date Noted  . Labral tear of shoulder, left, subsequent encounter 02/13/2020  . Shoulder dislocation, left, subsequent encounter 02/13/2020  . Seizure disorder ([place]) 12/22/2019  . Migraines 12/22/2019  . Mild intermittent asthma 12/22/2019    Scot Jun, PT, DPT, OCS, ATC 07/18/20  2:12 PM    [facility] [address] [place], [place], [address] Phone: [phone]   Fax:  [phone]  Name: Laroy Pinta MRN: [id number] Date of Birth: Mar 31, 2001

## 2020-07-19 ENCOUNTER — Encounter (HOSPITAL_COMMUNITY): Payer: Self-pay | Admitting: Emergency Medicine

## 2020-07-19 ENCOUNTER — Emergency Department (HOSPITAL_COMMUNITY)
Admission: EM | Admit: 2020-07-19 | Discharge: 2020-07-19 | Disposition: A | Discharge status: Home / Self Care | Payer: Commercial Managed Care - PPO | Attending: Emergency Medicine | Admitting: Emergency Medicine

## 2020-07-19 ENCOUNTER — Other Ambulatory Visit: Payer: Self-pay

## 2020-07-19 DIAGNOSIS — Z20822 Contact with and (suspected) exposure to covid-19: Secondary | ICD-10-CM | POA: Insufficient documentation

## 2020-07-19 DIAGNOSIS — K602 Anal fissure, unspecified: Secondary | ICD-10-CM | POA: Diagnosis not present

## 2020-07-19 DIAGNOSIS — J4520 Mild intermittent asthma, uncomplicated: Secondary | ICD-10-CM | POA: Diagnosis not present

## 2020-07-19 DIAGNOSIS — K5900 Constipation, unspecified: Secondary | ICD-10-CM | POA: Diagnosis present

## 2020-07-19 DIAGNOSIS — R519 Headache, unspecified: Secondary | ICD-10-CM | POA: Insufficient documentation

## 2020-07-19 LAB — RESP PANEL BY RT-PCR (FLU A&B, COVID) ARPGX2
Influenza A by PCR: NEGATIVE
Influenza B by PCR: NEGATIVE
SARS Coronavirus 2 by RT PCR: POSITIVE — AB

## 2020-07-19 MED ORDER — POLYETHYLENE GLYCOL 3350 17 GM/SCOOP PO POWD: 1.0000 | Freq: Once | ORAL | 0 refills | Status: AC

## 2020-07-19 NOTE — ED Triage Notes (Signed)
Patient presents complaining of constipation for a week. Patient states last bowel movement last week and he states he was passing blood with each bowel movement. Patient denies nausea and abdominal pain, endorses back pain.

## 2020-07-19 NOTE — ED Provider Notes (Signed)
[facility] DEPT Provider Note   CSN: [id number] Arrival date & time: 07/19/20  1902     History Chief Complaint  Patient presents with  . Constipation    W/ blood in stool last week  . Headache    [name] is a 19 y.o. male.  Patient is a 19 year old male with a history of seizure disorder, eosinophilic esophagitis, asthma and chronic constipation where he reports he usually has a bowel movement 2-3 times per week who is presenting today with blood in his stool.  He reports that he has had intermittent bright red blood in his stool for months and it is painful when he has a bowel movement.  Patient reports his last bowel movement was approximately 1 week ago.  When he strains to have a bowel movement he notices bright red blood in its on the toilet paper when he wipes.  He denies any abdominal pain, nausea or vomiting.  He is still taking all of his seizure medication but denies taking any stool softeners or laxatives in the past.  Also he reports today he has had a headache and low-grade temperature of 99 wishes to have a Covid test.  He denies any cough or shortness of breath.  Unclear if he has had positive Covid contacts.  The history is provided by the patient.  Constipation Headache      Past Medical History:  Diagnosis Date  . Allergy    rhinitis  . Asthma    as a child  . Complication of anesthesia    pt. reports he need more anesthesia with the septoplasty surgery  . Eosinophilic esophagitis    heartburn and reflux  . Family history of adverse reaction to anesthesia    mother respiratory failure  . Nasal fracture    deviated septum  . Pneumonia    1x history of  . Premature baby    2 months early  . Seizures ([place])    well controlled on meds/ one 2 months ago when meds were late    Patient Active Problem List   Diagnosis Date Noted  . Labral tear of shoulder, left, subsequent encounter 02/13/2020  . Shoulder  dislocation, left, subsequent encounter 02/13/2020  . Seizure disorder ([place]) 12/22/2019  . Migraines 12/22/2019  . Mild intermittent asthma 12/22/2019    Past Surgical History:  Procedure Laterality Date  . ADENOIDECTOMY    . CLOSED REDUCTION NASAL FRACTURE N/A 08/31/2017   Procedure: CLOSED REDUCTION NASAL FRACTURE;  Surgeon: Clyde Canterbury, MD;  Location: [facility];  Service: ENT;  Laterality: N/A;  . SEPTOPLASTY N/A 08/31/2017   Procedure: SEPTOPLASTY;  Surgeon: Clyde Canterbury, MD;  Location: [facility];  Service: ENT;  Laterality: N/A;  . SHOULDER ARTHROSCOPY WITH LABRAL REPAIR Left 04/30/2020   Procedure: LEFT SHOULDER POSTERIOR LABRAL REPAIR WITH ARTHROSCOPY, BICEPS TENDON RELEASE AND TENODESIS, OPEN ALLOGRAFT FOR REVERSE BANKART LESION;  Surgeon: [name], MD;  Location: [facility];  Service: Orthopedics;  Laterality: Left;  . TONSILLECTOMY     and addenoids       Family History  Problem Relation Age of Onset  . Cancer Mother        breat  . Protein S deficiency Mother     Social History   Tobacco Use  . Smoking status: Never Smoker  . Smokeless tobacco: Never Used  Vaping Use  . Vaping Use: Never used  Substance Use Topics  . Alcohol use: No  .  Drug use: No    Home Medications Prior to Admission medications   Medication Sig Start Date End Date Taking? Authorizing Provider  albuterol (PROVENTIL) (2.5 MG/3ML) 0.083% nebulizer solution Take 3 mLs (2.5 mg total) by nebulization every 4 (four) hours as needed for wheezing or shortness of breath. [id number]   Delora Fuel, MD  albuterol (VENTOLIN HFA) 108 (90 Base) MCG/ACT inhaler Inhale 2 puffs into the lungs every 4 (four) hours as needed for wheezing. 03/12/20   Burchette, Alinda Sierras, MD  cetirizine (ZYRTEC) 10 MG tablet Take 10 mg by mouth daily as needed for allergies.     [provider]  Cholecalciferol (VITAMIN D-1000 MAX ST) 25 MCG (1000 UT) tablet Take 1,000 Units by mouth daily.   06/13/18   [provider]  clonazePAM (KLONOPIN) 1 MG tablet Take 0.5 mg by mouth 2 (two) times daily as needed for anxiety.     [provider]  EPINEPHrine 0.3 mg/0.3 mL IJ SOAJ injection Inject 0.3 mg into the muscle as needed for anaphylaxis. 07/10/20   McDonald, Mia A, PA-C  levETIRAcetam (KEPPRA) 1000 MG tablet Take 2,000 mg by mouth 2 (two) times daily.     [provider]  lidocaine (LIDODERM) 5 % Place 1 patch onto the skin daily. Remove & Discard patch within 12 hours or as directed by MD Patient taking differently: Place 1 patch onto the skin daily as needed (pain). Remove & Discard patch within 12 hours or as directed by MD 02/25/20   Henderly, Britni A, PA-C  methocarbamol (ROBAXIN) 500 MG tablet Take 1 tablet (500 mg total) by mouth 2 (two) times daily. 02/25/20   Henderly, Britni A, PA-C  midazolam (VERSED) 5 MG/ML injection Place 2 mLs into the nose every 5 (five) minutes as needed (seizures). Draw up prescribed dose (ml) in syringe, remove blue vial access device, then attach syringe to nasal atomizer for intranasal administration.     [provider]  naproxen (NAPROSYN) 500 MG tablet Take 1 tablet (500 mg total) by mouth 2 (two) times daily. 02/25/20   Henderly, Britni A, PA-C  oxyCODONE (ROXICODONE) 5 MG immediate release tablet Take 1 tablet (5 mg total) by mouth every 6 (six) hours as needed. Patient not taking: Reported on 06/24/2020 04/30/20 04/30/21  Magnant, Gerrianne Scale, PA-C  polyethylene glycol powder (MIRALAX) 17 GM/SCOOP powder Take 255 g by mouth once for 1 dose. Take 1-2 scoops in 8oz of liquid daily until you start having regular soft bowel movements 07/19/20 07/19/20  Blanchie Dessert, MD  PRESCRIPTION MEDICATION Apply 1 application topically 3 (three) times daily as needed. Nausea - Phenergan Cream- Patient not taking: Reported on 04/22/2020    [provider]  topiramate (TOPAMAX) 200 MG tablet Take 200 mg by mouth 2 (two) times  daily.     [provider]    Allergies    Lortab [hydrocodone-acetaminophen], Other, Zofran [ondansetron hcl], Citrus, Dairy aid [lactase], Influenza virus vaccine, Soy allergy, Tylenol [acetaminophen], Eggs or egg-derived products, and Hydrocodone-acetaminophen  Review of Systems   Review of Systems  Gastrointestinal: Positive for constipation.  Neurological: Positive for headaches.  All other systems reviewed and are negative.   Physical Exam Updated Vital Signs BP 121/80 (BP Location: Left Arm)   Pulse 82   Temp 99 F (37.2 C) (Oral)   Resp 16   SpO2 100%   Physical Exam Vitals and nursing note reviewed. Exam conducted with a chaperone present.  Constitutional:      General:  He is not in acute distress.    Appearance: He is well-developed and well-nourished.  HENT:     Head: Normocephalic and atraumatic.     Mouth/Throat:     Mouth: Oropharynx is clear and moist.  Eyes:     Extraocular Movements: EOM normal.     Conjunctiva/sclera: Conjunctivae normal.     Pupils: Pupils are equal, round, and reactive to light.  Cardiovascular:     Rate and Rhythm: Normal rate and regular rhythm.     Pulses: Intact distal pulses.     Heart sounds: No murmur heard.   Pulmonary:     Effort: Pulmonary effort is normal. No respiratory distress.     Breath sounds: Normal breath sounds. No wheezing or rales.  Abdominal:     General: There is no distension.     Palpations: Abdomen is soft.     Tenderness: There is no abdominal tenderness. There is no guarding or rebound.  Genitourinary:    Rectum: Anal fissure present. No external hemorrhoid.    Musculoskeletal:        General: No tenderness or edema. Normal range of motion.     Cervical back: Normal range of motion and neck supple.  Skin:    General: Skin is warm and dry.     Findings: No erythema or rash.  Neurological:     Mental Status: He is alert and oriented to person, place, and time.  Psychiatric:         Mood and Affect: Mood and affect normal.        Behavior: Behavior normal.      ED Results / Procedures / Treatments   Labs (all labs ordered are listed, but only abnormal results are displayed) Labs Reviewed  RESP PANEL BY RT-PCR (FLU A&B, COVID) ARPGX2    EKG None  Radiology No results found.  Procedures Procedures (including critical care time)  Medications Ordered in ED Medications - No data to display  ED Course  I have reviewed the triage vital signs and the nursing notes.  Pertinent labs & imaging results that were available during my care of the patient were reviewed by me and considered in my medical decision making (see chart for details).    MDM Rules/Calculators/A&P                          Patient is a 19 year old male presenting today with symptoms of constipation and bright red blood per rectum.  Patient has evidence of anal fissure but no other acute findings.  This is most likely due to chronic constipation.  He was started on MiraLAX and told to do sitz bath's.  He has no abdominal pain at this time or concern for other underlying etiology.  Secondly patient is complaining of a mild headache and low-grade temperature of 99.  He wishes to have a Covid test.  He is well-appearing here.  Vital signs are normal and satting 100% on room air.  Patient was swabbed prior to discharge and will follow up on his testing results.  Mi Ginger Carne was evaluated in Emergency Department on 07/19/2020 for the symptoms described in the history of present illness. He was evaluated in the context of the global COVID-19 pandemic, which necessitated consideration that the patient might be at risk for infection with the SARS-CoV-2 virus that causes COVID-19. Institutional protocols and algorithms that pertain to the evaluation of patients at risk for COVID-19 are in  a state of rapid change based on information released by regulatory bodies including the CDC and federal and state  organizations. These policies and algorithms were followed during the patient's care in the ED.  Final Clinical Impression(s) / ED Diagnoses Final diagnoses:  Anal fissure  Constipation, unspecified constipation type  COVID-19 ruled out    Rx / DC Orders ED Discharge Orders         Ordered    polyethylene glycol powder (MIRALAX) 17 GM/SCOOP powder   Once        07/19/20 2044           Blanchie Dessert, MD 07/19/20 2110

## 2020-07-19 NOTE — Discharge Instructions (Signed)
Try doing soaks in warm water and taking the laxative to help make your stool softer.  Also Covid test should return in the next few hours.

## 2020-07-20 ENCOUNTER — Encounter: Payer: Self-pay | Admitting: Family Medicine

## 2020-07-22 ENCOUNTER — Encounter (HOSPITAL_COMMUNITY): Payer: Self-pay

## 2020-07-22 ENCOUNTER — Telehealth (HOSPITAL_COMMUNITY): Payer: Self-pay

## 2020-07-22 ENCOUNTER — Other Ambulatory Visit: Payer: Self-pay

## 2020-07-22 ENCOUNTER — Emergency Department (HOSPITAL_COMMUNITY)
Admission: EM | Admit: 2020-07-22 | Discharge: 2020-07-22 | Disposition: A | Discharge status: Home / Self Care | Payer: Commercial Managed Care - PPO | Attending: Emergency Medicine | Admitting: Emergency Medicine

## 2020-07-22 DIAGNOSIS — U071 COVID-19: Secondary | ICD-10-CM | POA: Diagnosis not present

## 2020-07-22 DIAGNOSIS — R519 Headache, unspecified: Secondary | ICD-10-CM | POA: Diagnosis present

## 2020-07-22 DIAGNOSIS — J4520 Mild intermittent asthma, uncomplicated: Secondary | ICD-10-CM | POA: Insufficient documentation

## 2020-07-22 DIAGNOSIS — Z20822 Contact with and (suspected) exposure to covid-19: Secondary | ICD-10-CM

## 2020-07-22 LAB — RESP PANEL BY RT-PCR (FLU A&B, COVID) ARPGX2
Influenza A by PCR: NEGATIVE
Influenza B by PCR: NEGATIVE
SARS Coronavirus 2 by RT PCR: POSITIVE — AB

## 2020-07-22 NOTE — ED Provider Notes (Signed)
[facility] DEPT Provider Note   CSN: [id number] Arrival date & time: 07/22/20  1229     History Chief Complaint  Patient presents with  . Headache  . Nasal Congestion    [name] is a 19 y.o. male.  HPI Patient presents to the ED for evaluation of difficulty with headache and nasal congestion. Patient states symptoms started last day or 2. He started developing mild headache and nasal congestion. He is not having a cough. No fevers. No shortness of breath. Patient has been vaccinated for Covid. He was worried however with his symptoms and wanted to make sure he did not have covid before he visited his grandmother.    Past Medical History:  Diagnosis Date  . Allergy    rhinitis  . Asthma    as a child  . Complication of anesthesia    pt. reports he need more anesthesia with the septoplasty surgery  . Eosinophilic esophagitis    heartburn and reflux  . Family history of adverse reaction to anesthesia    mother respiratory failure  . Nasal fracture    deviated septum  . Pneumonia    1x history of  . Premature baby    2 months early  . Seizures ([place])    well controlled on meds/ one 2 months ago when meds were late    Patient Active Problem List   Diagnosis Date Noted  . Labral tear of shoulder, left, subsequent encounter 02/13/2020  . Shoulder dislocation, left, subsequent encounter 02/13/2020  . Seizure disorder ([place]) 12/22/2019  . Migraines 12/22/2019  . Mild intermittent asthma 12/22/2019    Past Surgical History:  Procedure Laterality Date  . ADENOIDECTOMY    . CLOSED REDUCTION NASAL FRACTURE N/A 08/31/2017   Procedure: CLOSED REDUCTION NASAL FRACTURE;  Surgeon: Clyde Canterbury, MD;  Location: [facility];  Service: ENT;  Laterality: N/A;  . SEPTOPLASTY N/A 08/31/2017   Procedure: SEPTOPLASTY;  Surgeon: Clyde Canterbury, MD;  Location: [facility];  Service: ENT;  Laterality: N/A;  . SHOULDER ARTHROSCOPY WITH  LABRAL REPAIR Left 04/30/2020   Procedure: LEFT SHOULDER POSTERIOR LABRAL REPAIR WITH ARTHROSCOPY, BICEPS TENDON RELEASE AND TENODESIS, OPEN ALLOGRAFT FOR REVERSE BANKART LESION;  Surgeon: [name], MD;  Location: [facility];  Service: Orthopedics;  Laterality: Left;  . TONSILLECTOMY     and addenoids       Family History  Problem Relation Age of Onset  . Cancer Mother        breat  . Protein S deficiency Mother     Social History   Tobacco Use  . Smoking status: Never Smoker  . Smokeless tobacco: Never Used  Vaping Use  . Vaping Use: Never used  Substance Use Topics  . Alcohol use: No  . Drug use: No    Home Medications Prior to Admission medications   Medication Sig Start Date End Date Taking? Authorizing Provider  albuterol (PROVENTIL) (2.5 MG/3ML) 0.083% nebulizer solution Take 3 mLs (2.5 mg total) by nebulization every 4 (four) hours as needed for wheezing or shortness of breath. 37/85/88   Delora Fuel, MD  albuterol (VENTOLIN HFA) 108 (90 Base) MCG/ACT inhaler Inhale 2 puffs into the lungs every 4 (four) hours as needed for wheezing. 03/12/20   Burchette, Alinda Sierras, MD  cetirizine (ZYRTEC) 10 MG tablet Take 10 mg by mouth daily as needed for allergies.     [provider]  Cholecalciferol (VITAMIN D-1000 MAX ST) 25 MCG (  1000 UT) tablet Take 1,000 Units by mouth daily.  06/13/18   [provider]  clonazePAM (KLONOPIN) 1 MG tablet Take 0.5 mg by mouth 2 (two) times daily as needed for anxiety.     [provider]  EPINEPHrine 0.3 mg/0.3 mL IJ SOAJ injection Inject 0.3 mg into the muscle as needed for anaphylaxis. 07/10/20   McDonald, Mia A, PA-C  levETIRAcetam (KEPPRA) 1000 MG tablet Take 2,000 mg by mouth 2 (two) times daily.     [provider]  lidocaine (LIDODERM) 5 % Place 1 patch onto the skin daily. Remove & Discard patch within 12 hours or as directed by MD Patient taking differently: Place 1 patch onto the skin daily as needed  (pain). Remove & Discard patch within 12 hours or as directed by MD 02/25/20   Henderly, Britni A, PA-C  methocarbamol (ROBAXIN) 500 MG tablet Take 1 tablet (500 mg total) by mouth 2 (two) times daily. 02/25/20   Henderly, Britni A, PA-C  midazolam (VERSED) 5 MG/ML injection Place 2 mLs into the nose every 5 (five) minutes as needed (seizures). Draw up prescribed dose (ml) in syringe, remove blue vial access device, then attach syringe to nasal atomizer for intranasal administration.     [provider]  naproxen (NAPROSYN) 500 MG tablet Take 1 tablet (500 mg total) by mouth 2 (two) times daily. 02/25/20   Henderly, Britni A, PA-C  oxyCODONE (ROXICODONE) 5 MG immediate release tablet Take 1 tablet (5 mg total) by mouth every 6 (six) hours as needed. Patient not taking: Reported on 06/24/2020 04/30/20 04/30/21  Magnant, Joycie Peek, PA-C  PRESCRIPTION MEDICATION Apply 1 application topically 3 (three) times daily as needed. Nausea - Phenergan Cream- Patient not taking: Reported on 04/22/2020    [provider]  topiramate (TOPAMAX) 200 MG tablet Take 200 mg by mouth 2 (two) times daily.     [provider]    Allergies    Lortab [hydrocodone-acetaminophen], Other, Zofran [ondansetron hcl], Citrus, Dairy aid [lactase], Influenza virus vaccine, Soy allergy, Tylenol [acetaminophen], Eggs or egg-derived products, and Hydrocodone-acetaminophen  Review of Systems   Review of Systems  All other systems reviewed and are negative.   Physical Exam Updated Vital Signs BP (!) 133/92 (BP Location: Right Arm)   Pulse 70   Temp 98 F (36.7 C) (Oral)   Resp 18   SpO2 100%   Physical Exam Vitals and nursing note reviewed.  Constitutional:      General: He is not in acute distress.    Appearance: He is well-developed and well-nourished.  HENT:     Head: Normocephalic and atraumatic.     Right Ear: External ear normal.     Left Ear: External ear normal.  Eyes:     General: No  scleral icterus.       Right eye: No discharge.        Left eye: No discharge.     Conjunctiva/sclera: Conjunctivae normal.  Neck:     Trachea: No tracheal deviation.  Cardiovascular:     Rate and Rhythm: Normal rate and regular rhythm.     Pulses: Intact distal pulses.  Pulmonary:     Effort: Pulmonary effort is normal. No respiratory distress.     Breath sounds: Normal breath sounds. No stridor. No wheezing or rales.  Abdominal:     General: Bowel sounds are normal. There is no distension.     Palpations: Abdomen is soft.     Tenderness: There is  no abdominal tenderness. There is no guarding or rebound.  Musculoskeletal:        General: No tenderness or edema.     Cervical back: Neck supple.  Skin:    General: Skin is warm and dry.     Findings: No rash.  Neurological:     Mental Status: He is alert.     Cranial Nerves: No cranial nerve deficit (no facial droop, extraocular movements intact, no slurred speech).     Sensory: No sensory deficit.     Motor: No abnormal muscle tone or seizure activity.     Coordination: Coordination normal.     Deep Tendon Reflexes: Strength normal.  Psychiatric:        Mood and Affect: Mood and affect normal.     ED Results / Procedures / Treatments   Labs (all labs ordered are listed, but only abnormal results are displayed) Labs Reviewed  RESP PANEL BY RT-PCR (FLU A&B, COVID) ARPGX2     Procedures Procedures (including critical care time)  Medications Ordered in ED Medications - No data to display  ED Course  I have reviewed the triage vital signs and the nursing notes.  Pertinent labs & imaging results that were available during my care of the patient were reviewed by me and considered in my medical decision making (see chart for details).    MDM Rules/Calculators/A&P                         Mi Ginger Carne was evaluated in Emergency Department on 07/22/2020 for the symptoms described in the history of present illness. He  was evaluated in the context of the global COVID-19 pandemic, which necessitated consideration that the patient might be at risk for infection with the SARS-CoV-2 virus that causes COVID-19. Institutional protocols and algorithms that pertain to the evaluation of patients at risk for COVID-19 are in a state of rapid change based on information released by regulatory bodies including the CDC and federal and state organizations. These policies and algorithms were followed during the patient's care in the ED.  Patient is nontoxic and well-appearing. Vital signs are normal. No oxygen requirement. No signs of severe Covid infection. We will send off covid and influenza test as requested. Explained to patient that he can check on his results in [place]. Final Clinical Impression(s) / ED Diagnoses Final diagnoses:  Person under investigation for COVID-19    Rx / DC Orders ED Discharge Orders    None       Dorie Rank, MD 07/22/20 1550

## 2020-07-22 NOTE — ED Triage Notes (Signed)
Pt presents with c/o headache and nasal congestion for 2 days. Pt reports he just wanted to make sure that he did not have Covid.

## 2020-07-22 NOTE — ED Notes (Signed)
Covid + results received. Pt is to check  MyChart this evening for results as directed on AVS

## 2020-07-22 NOTE — Discharge Instructions (Addendum)
Check your my chart for your Covid results. They should be available by this evening.

## 2020-07-23 ENCOUNTER — Other Ambulatory Visit: Payer: Self-pay

## 2020-07-24 ENCOUNTER — Telehealth: Payer: Commercial Managed Care - PPO | Admitting: Family Medicine

## 2020-07-24 ENCOUNTER — Encounter: Payer: Self-pay | Admitting: Family Medicine

## 2020-07-24 DIAGNOSIS — K209 Esophagitis, unspecified without bleeding: Secondary | ICD-10-CM | POA: Insufficient documentation

## 2020-07-25 ENCOUNTER — Emergency Department (HOSPITAL_COMMUNITY)
Admission: EM | Admit: 2020-07-25 | Discharge: 2020-07-25 | Disposition: A | Discharge status: Home / Self Care | Payer: Commercial Managed Care - PPO | Attending: Emergency Medicine | Admitting: Emergency Medicine

## 2020-07-25 ENCOUNTER — Other Ambulatory Visit: Payer: Self-pay

## 2020-07-25 ENCOUNTER — Telehealth: Payer: Self-pay | Admitting: Rehabilitative and Restorative Service Providers"

## 2020-07-25 ENCOUNTER — Encounter (HOSPITAL_COMMUNITY): Payer: Self-pay

## 2020-07-25 ENCOUNTER — Encounter: Payer: Commercial Managed Care - PPO | Admitting: Rehabilitative and Restorative Service Providers"

## 2020-07-25 DIAGNOSIS — J45909 Unspecified asthma, uncomplicated: Secondary | ICD-10-CM | POA: Diagnosis not present

## 2020-07-25 DIAGNOSIS — Z1152 Encounter for screening for COVID-19: Secondary | ICD-10-CM | POA: Diagnosis present

## 2020-07-25 DIAGNOSIS — U071 COVID-19: Secondary | ICD-10-CM | POA: Diagnosis not present

## 2020-07-25 NOTE — Discharge Instructions (Addendum)
You have been seen here for URI like symptoms.  I recommend taking Tylenol for fever control and ibuprofen for pain control please follow dosing on the back of bottle.  I recommend staying hydrated and if you do not an appetite, I recommend soups as this will provide you with fluids and calories. you are Covid positive you must self quarantine for 10 days starting on symptom onset.  I would like you to contact "post Covid care" as they will provide you with information how to manage your Covid symptoms.  Your Covid test will stay positive for the next few weeks, I recommend following up at [facility], [facility], student health, urgent care for a retest.  Come back to the emergency department if you develop chest pain, shortness of breath, severe abdominal pain, uncontrolled nausea, vomiting, diarrhea.

## 2020-07-25 NOTE — ED Triage Notes (Signed)
Pt reports he is here to get another Covid test. 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

## 2020-07-25 NOTE — ED Provider Notes (Signed)
[facility] DEPT Provider Note   CSN: [id number] Arrival date & time: 07/25/20  1657     History Chief Complaint  Patient presents with  . Covid Positive    Terrence Riley is a 19 y.o. male.  HPI   Patient with significant medical history of asthma, presents to the emergency department with chief complaint of repeat COVID-19 test.  Patient states he had tested positive for Covid over the last few days and needs to be repeated so that he can play basketball.  He has no other complaints at this time, he is not immunocompromise, has received vaccine for COVID-19.  After reviewing patient's chart he had a positive Covid test on the 24th and on the 27th, discharged home as he had no new oxygen requirements.  Patient has no complaints at this time.  He denies headaches, fevers, chills, shortness breath, chest pain, abdominal pain, nausea, vomiting, diarrhea, pedal edema.   Past Medical History:  Diagnosis Date  . Allergy    rhinitis  . Asthma    as a child  . Complication of anesthesia    pt. reports he need more anesthesia with the septoplasty surgery  . Eosinophilic esophagitis    heartburn and reflux  . Family history of adverse reaction to anesthesia    mother respiratory failure  . Nasal fracture    deviated septum  . Pneumonia    1x history of  . Premature baby    2 months early  . Seizures ([place])    well controlled on meds/ one 2 months ago when meds were late    Patient Active Problem List   Diagnosis Date Noted  . Esophagitis determined by endoscopy 07/24/2020  . Labral tear of shoulder, left, subsequent encounter 02/13/2020  . Shoulder dislocation, left, subsequent encounter 02/13/2020  . Seizure disorder ([place]) 12/22/2019  . Migraines 12/22/2019  . Mild intermittent asthma 12/22/2019  . Migraine without aura and without status migrainosus, not intractable 06/29/2019  . 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 Altered mental status  03/12/2014  . Lethargic 03/09/2014  . Environmental allergies 03/01/2014  . Gastroesophageal reflux disease with esophagitis 10/06/2013  . Localization-related (focal) (partial) symptomatic epilepsy and epileptic syndromes with complex partial seizures, not intractable, without status epilepticus ([place]) 10/06/2013  . Spells 07/26/2013  . Allergy to other foods 07/13/2013  . Prophylactic immunotherapy 07/13/2013  . Unspecified asthma, uncomplicated 123456  . Eosinophilic esophagitis 123456  . Gastroesophageal reflux 01/23/2013  . Vomiting 01/23/2013  . Leukopenia 10/19/2012  . Muscle jerks during sleep 10/19/2012  . Constipation 08/20/2012  . Neutropenia ([place]) 08/20/2012  . Allergic rhinitis due to pollen 02/19/2011    Past Surgical History:  Procedure Laterality Date  . ADENOIDECTOMY    . CLOSED REDUCTION NASAL FRACTURE N/A 08/31/2017   Procedure: CLOSED REDUCTION NASAL FRACTURE;  Surgeon: Clyde Canterbury, MD;  Location: [place];  Service: ENT;  Laterality: N/A;  . SEPTOPLASTY N/A 08/31/2017   Procedure: SEPTOPLASTY;  Surgeon: Clyde Canterbury, MD;  Location: [place];  Service: ENT;  Laterality: N/A;  . SHOULDER ARTHROSCOPY WITH LABRAL REPAIR Left 04/30/2020   Procedure: LEFT SHOULDER POSTERIOR LABRAL REPAIR WITH ARTHROSCOPY, BICEPS TENDON RELEASE AND TENODESIS, OPEN ALLOGRAFT FOR REVERSE BANKART LESION;  Surgeon: [name], MD;  Location: [place];  Service: Orthopedics;  Laterality: Left;  . TONSILLECTOMY     and addenoids       Family History  Problem Relation Age of Onset  . Cancer Mother  breat  . Protein S deficiency Mother     Social History   Tobacco Use  . Smoking status: Never Smoker  . Smokeless tobacco: Never Used  Vaping Use  . Vaping Use: Never used  Substance Use Topics  . Alcohol use: No  . Drug use: No    Home Medications Prior to Admission medications   Medication Sig Start Date End Date Taking? Authorizing  Provider  albuterol (PROVENTIL) (2.5 MG/3ML) 0.083% nebulizer solution Take 3 mLs (2.5 mg total) by nebulization every 4 (four) hours as needed for wheezing or shortness of breath. [id number]   Delora Fuel, MD  albuterol (VENTOLIN HFA) 108 (90 Base) MCG/ACT inhaler Inhale 2 puffs into the lungs every 4 (four) hours as needed for wheezing. 03/12/20   Burchette, Alinda Sierras, MD  cetirizine (ZYRTEC) 10 MG tablet Take 10 mg by mouth daily as needed for allergies.     [provider]  Cholecalciferol 25 MCG (1000 UT) tablet Take 1,000 Units by mouth daily.  06/13/18   [provider]  clonazePAM (KLONOPIN) 1 MG tablet Take 0.5 mg by mouth 2 (two) times daily as needed for anxiety.     [provider]  EPINEPHrine 0.3 mg/0.3 mL IJ SOAJ injection Inject 0.3 mg into the muscle as needed for anaphylaxis. 07/10/20   McDonald, Mia A, PA-C  levETIRAcetam (KEPPRA) 1000 MG tablet Take 2,000 mg by mouth 2 (two) times daily.     [provider]  lidocaine (LIDODERM) 5 % Place 1 patch onto the skin daily. Remove & Discard patch within 12 hours or as directed by MD Patient taking differently: Place 1 patch onto the skin daily as needed (pain). Remove & Discard patch within 12 hours or as directed by MD 02/25/20   Henderly, Britni A, PA-C  methocarbamol (ROBAXIN) 500 MG tablet Take 1 tablet (500 mg total) by mouth 2 (two) times daily. 02/25/20   Henderly, Britni A, PA-C  midazolam (VERSED) 5 MG/ML injection Place 2 mLs into the nose every 5 (five) minutes as needed (seizures). Draw up prescribed dose (ml) in syringe, remove blue vial access device, then attach syringe to nasal atomizer for intranasal administration.    [provider]  naproxen (NAPROSYN) 500 MG tablet Take 1 tablet (500 mg total) by mouth 2 (two) times daily. 02/25/20   Henderly, Britni A, PA-C  oxyCODONE (ROXICODONE) 5 MG immediate release tablet Take 1 tablet (5 mg total) by mouth every 6 (six) hours as needed. 04/30/20  04/30/21  Magnant, Gerrianne Scale, PA-C  PRESCRIPTION MEDICATION Apply 1 application topically 3 (three) times daily as needed. Nausea - Phenergan Cream-    [provider]  topiramate (TOPAMAX) 200 MG tablet Take 200 mg by mouth 2 (two) times daily.     [provider]    Allergies    Lortab [hydrocodone-acetaminophen], Other, Zofran [ondansetron hcl], Citrus, Dairy aid [lactase], Influenza virus vaccine, Soy allergy, Tylenol [acetaminophen], Eggs or egg-derived products, and Hydrocodone-acetaminophen  Review of Systems   Review of Systems  Constitutional: Negative for chills and fever.  HENT: Negative for congestion.   Respiratory: Negative for shortness of breath.   Cardiovascular: Negative for chest pain.  Gastrointestinal: Negative for abdominal pain.  Genitourinary: Negative for enuresis.  Musculoskeletal: Negative for back pain.  Skin: Negative for rash.  Neurological: Negative for dizziness.  Hematological: Does not bruise/bleed easily.    Physical Exam Updated Vital Signs BP 129/81 (BP Location: Left Arm)   Pulse 90   Temp  99 F (37.2 C) (Oral)   Resp 16   SpO2 95%   Physical Exam Vitals and nursing note reviewed.  Constitutional:      General: He is not in acute distress.    Appearance: He is not ill-appearing.  HENT:     Head: Normocephalic and atraumatic.     Nose: No congestion.  Eyes:     Conjunctiva/sclera: Conjunctivae normal.  Cardiovascular:     Rate and Rhythm: Normal rate.     Pulses: Normal pulses.  Pulmonary:     Effort: Pulmonary effort is normal.  Musculoskeletal:     Comments: Patient is moving all 4 extremities at difficulty.  Skin:    General: Skin is warm and dry.  Neurological:     Mental Status: He is alert.     Comments: Patient having no difficulty with word finding.  Psychiatric:        Mood and Affect: Mood normal.     ED Results / Procedures / Treatments   Labs (all labs ordered are listed, but only abnormal  results are displayed) Labs Reviewed - No data to display  EKG None  Radiology No results found.  Procedures Procedures (including critical care time)  Medications Ordered in ED Medications - No data to display  ED Course  I have reviewed the triage vital signs and the nursing notes.  Pertinent labs & imaging results that were available during my care of the patient were reviewed by me and considered in my medical decision making (see chart for details).    MDM Rules/Calculators/A&P                          Patient presents for repeat Covid test.  He was alert, does not appear in acute distress, vital signs reassuring.  Due to well-appearing patient, benign physical exam further lab and imaging not warranted at this time.  I explained to the patient that repeat Covid is not necessary at this time as he had a positive Covid 3 days ago and it unlikely that the test would not be positive today, I recommend in the next 2 to 3 weeks he follows up at [facility], [facility], urgent care, student health for a repeat Covid test so that he can be cleared to play basketball.  Low suspicion for systemic infection as patient is nontoxic-appearing, vital signs reassuring, no obvious source infection noted on exam.  I have low suspicion for PE as patient denies pleuritic chest pain, shortness of breath,patient is PERC.  Low suspicion patient would need  hospitalized due to  Covid as vital signs reassuring, patient is not in respiratory distress.  Recommend patient follows up with post Covid care, obtain repeat Covid test in the next 2 to 3 weeks that [facility], [facility], urgent care or student health.  Vital signs have remained stable, no indication for hospital admission.  Patient discussed with attending and they agreed with assessment and plan.  Patient given at home care as well strict return precautions.  Patient verbalized that they understood agreed to said plan.   Final Clinical Impression(s) / ED  Diagnoses Final diagnoses:  COVID    Rx / DC Orders ED Discharge Orders    None       Carroll Sage, PA-C 07/25/20 1948    Geoffery Lyons, MD 07/26/20 2302

## 2020-07-25 NOTE — Telephone Encounter (Signed)
Called about missed visit, can't leave message due to mailbox full.  Chyrel Masson, PT, DPT, OCS, ATC 07/25/20  2:06 PM

## 2020-07-25 NOTE — Progress Notes (Signed)
Pt was a no show

## 2020-08-01 ENCOUNTER — Ambulatory Visit (INDEPENDENT_AMBULATORY_CARE_PROVIDER_SITE_OTHER): Payer: Commercial Managed Care - PPO | Admitting: Physical Therapy

## 2020-08-01 ENCOUNTER — Other Ambulatory Visit: Payer: Self-pay

## 2020-08-01 ENCOUNTER — Encounter: Payer: Self-pay | Admitting: Physical Therapy

## 2020-08-01 DIAGNOSIS — M6281 Muscle weakness (generalized): Secondary | ICD-10-CM

## 2020-08-01 DIAGNOSIS — M25612 Stiffness of left shoulder, not elsewhere classified: Secondary | ICD-10-CM | POA: Diagnosis not present

## 2020-08-01 DIAGNOSIS — R293 Abnormal posture: Secondary | ICD-10-CM

## 2020-08-01 NOTE — Therapy (Addendum)
[place], [place], [address] Phone: [phone]   Fax:  [phone]  Physical Therapy Treatment  Patient Details  Name: Terrence Riley MRN: [id number] Date of Birth: Jan 30, 2001 Referring Provider (PT): [name], MD   Encounter Date: 08/01/2020   PT End of Session - 08/01/20 1421    Visit Number 4    Number of Visits 18    Date for PT Re-Evaluation 09/05/20    Progress Note Due on Visit 10    PT Start Time 1335    PT Stop Time 1420    PT Time Calculation (min) 45 min    Activity Tolerance Patient tolerated treatment well    Behavior During Therapy [facility] for tasks assessed/performed           Past Medical History:  Diagnosis Date  . Allergy    rhinitis  . Asthma    as a child  . Complication of anesthesia    pt. reports he need more anesthesia with the septoplasty surgery  . Eosinophilic esophagitis    heartburn and reflux  . Family history of adverse reaction to anesthesia    mother respiratory failure  . Nasal fracture    deviated septum  . Pneumonia    1x history of  . Premature baby    2 months early  . Seizures ([place])    well controlled on meds/ one 2 months ago when meds were late    Past Surgical History:  Procedure Laterality Date  . ADENOIDECTOMY    . CLOSED REDUCTION NASAL FRACTURE N/A 08/31/2017   Procedure: CLOSED REDUCTION NASAL FRACTURE;  Surgeon: Clyde Canterbury, MD;  Location: [facility];  Service: ENT;  Laterality: N/A;  . SEPTOPLASTY N/A 08/31/2017   Procedure: SEPTOPLASTY;  Surgeon: Clyde Canterbury, MD;  Location: [facility];  Service: ENT;  Laterality: N/A;  . SHOULDER ARTHROSCOPY WITH LABRAL REPAIR Left 04/30/2020   Procedure: LEFT SHOULDER POSTERIOR LABRAL REPAIR WITH ARTHROSCOPY, BICEPS TENDON RELEASE AND TENODESIS, OPEN ALLOGRAFT FOR REVERSE BANKART LESION;  Surgeon: [name], MD;  Location: [facility];  Service: Orthopedics;  Laterality: Left;  .  TONSILLECTOMY     and addenoids    There were no vitals filed for this visit.   Subjective Assessment - 08/01/20 1337    Subjective doing well, had COVID over Christmas but minimal symptoms    Patient Stated Goals play basketball    Currently in Pain? No/denies                             [facility] Adult PT Treatment/Exercise - 08/01/20 1338      Blood Flow Restriction   Blood Flow Restriction Yes      Blood Flow Restriction-Positions    Blood Flow Restriction Position Standing      BFR Standing   Standing Limb Occulsion Pressure (mmHg) 140    Standing Exercise Pressure (mmHg) 70    Standing Exercise Prescription 30,15,15,15, reps w/ 30-60 sec rest    Standing Exercise Prescription Comment cuff size 1      Shoulder Exercises: Standing   Flexion Left   with BFR   Shoulder Flexion Weight (lbs) 1    ABduction Left   BFR   Shoulder ABduction Weight (lbs) 1    Other Standing Exercises bicep curls with BFR; Lt 5#      Shoulder Exercises: ROM/Strengthening   UBE (Upper Arm Bike) L3.5  x 8 min; 15 sec fast/45 sec rest; alt fwd/bwd every 2 min    Lat Pull Limitations 20# 3x10    Cybex Row Limitations 35# x10; 45# 2x10                    PT Short Term Goals - 07/11/20 1426      PT SHORT TERM GOAL #1   Title independent with initial HEP    Baseline 12/16: updated today to progress AROM/strengthening    Status On-going    Target Date 07/22/20             PT Long Term Goals - 07/11/20 1427      PT LONG TERM GOAL #1   Title Independent with HEP  Baseline: no HEP   Status On-going    Target Date 09/06/19      PT LONG TERM GOAL #2   Title Improve Lt shoulder AROM to WNL for improved function  Baseline: flexion 122; abduction 99   Status On-going      PT LONG TERM GOAL #3   Title FOTO score improved to 82  Baseline 74   Status On-going      PT LONG TERM GOAL #4   Title Demonstrate 5/5 Lt shoulder strength for improved function  Baseline 3-/5    Status On-going                 Plan - 08/01/20 1422    Clinical Impression Statement Initiated BFR today as pt with significant disuse atrophy in LUE.  Will continue to benefit from PT to maximize function.    Personal Factors and Comorbidities Age;Behavior Pattern;Comorbidity 1    Comorbidities seizures, reports not following MD precautions/recommendations    Examination-Activity Limitations Lift;Reach Overhead    Examination-Participation Restrictions Community Activity;School;Other   sports   Stability/Clinical Decision Making Evolving/Moderate complexity    Rehab Potential Good    PT Frequency 2x / week   1-2x/wk x 8 weeks; no follow up until after MD appt and can progress activity   PT Duration 8 weeks    PT Treatment/Interventions ADLs/Self Care Home Management;Cryotherapy;Electrical Stimulation;Moist Heat;Therapeutic exercise;Therapeutic activities;Ultrasound;Patient/family education;Manual techniques;Taping;Dry needling;Passive range of motion    PT Next Visit Plan progress strength, focus on end range, contiue BFR    PT Home Exercise Plan Access Code: [email or web address]    Consulted and Agree with Plan of Care Patient           Patient will benefit from skilled therapeutic intervention in order to improve the following deficits and impairments:  Decreased strength,Postural dysfunction,Decreased range of motion,Decreased knowledge of precautions  Visit Diagnosis: Stiffness of left shoulder, not elsewhere classified  Muscle weakness (generalized)  Abnormal posture     Problem List Patient Active Problem List   Diagnosis Date Noted  . Esophagitis determined by endoscopy 07/24/2020  . Labral tear of shoulder, left, subsequent encounter 02/13/2020  . Shoulder dislocation, left, subsequent encounter 02/13/2020  . Seizure disorder ([place]) 12/22/2019  . Migraines 12/22/2019  . Mild intermittent asthma 12/22/2019  . Migraine without aura and without status migrainosus, not  intractable 06/29/2019  . 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 Altered mental status 03/12/2014  . Lethargic 03/09/2014  . Environmental allergies 03/01/2014  . Gastroesophageal reflux disease with esophagitis 10/06/2013  . Localization-related (focal) (partial) symptomatic epilepsy and epileptic syndromes with complex partial seizures, not intractable, without status epilepticus ([place]) 10/06/2013  . Spells 07/26/2013  . Allergy to other foods 07/13/2013  . Prophylactic immunotherapy 07/13/2013  .  Unspecified asthma, uncomplicated 07/13/2013  . Eosinophilic esophagitis 01/23/2013  . Gastroesophageal reflux 01/23/2013  . Vomiting 01/23/2013  . Leukopenia 10/19/2012  . Muscle jerks during sleep 10/19/2012  . Constipation 08/20/2012  . Neutropenia ([facility]) 08/20/2012  . Allergic rhinitis due to pollen 02/19/2011      Clarita Crane, PT, DPT 08/01/20 2:23 PM   [facility] Physical Therapy [address] [place], [place], [address] Phone: [phone]   Fax:  [phone]  Name: Terrence Riley MRN: [id number] Date of Birth: 2001-04-04     Check all possible CPT codes: 97110- Therapeutic Exercise, [phone]- Neuro Re-education, [phone] - Gait Training, [phone] - Manual Therapy, [phone] - Therapeutic Activities, [phone] - Self Care, [id number] - Electrical stimulation (unattended), [id number] - Ultrasound and [id number] - Physical performance training

## 2020-08-02 ENCOUNTER — Encounter: Payer: Self-pay | Admitting: Rehabilitative and Restorative Service Providers"

## 2020-08-02 ENCOUNTER — Ambulatory Visit (INDEPENDENT_AMBULATORY_CARE_PROVIDER_SITE_OTHER): Payer: Self-pay | Admitting: Rehabilitative and Restorative Service Providers"

## 2020-08-02 DIAGNOSIS — M25612 Stiffness of left shoulder, not elsewhere classified: Secondary | ICD-10-CM

## 2020-08-02 DIAGNOSIS — M6281 Muscle weakness (generalized): Secondary | ICD-10-CM

## 2020-08-02 DIAGNOSIS — R293 Abnormal posture: Secondary | ICD-10-CM

## 2020-08-02 NOTE — Therapy (Signed)
[place], [place], [address] Phone: [phone]   Fax:  [phone]  Physical Therapy Treatment  Patient Details  Name: Terrence Riley MRN: [id number] Date of Birth: 2000/12/28 Referring Provider (PT): [name], MD   Encounter Date: 08/02/2020   PT End of Session - 08/02/20 1308    Visit Number 5    Number of Visits 18    Date for PT Re-Evaluation 09/05/20    Progress Note Due on Visit 10    PT Start Time 1300    PT Stop Time 1340    PT Time Calculation (min) 40 min    Activity Tolerance Patient tolerated treatment well    Behavior During Therapy [facility] for tasks assessed/performed           Past Medical History:  Diagnosis Date  . Allergy    rhinitis  . Asthma    as a child  . Complication of anesthesia    pt. reports he need more anesthesia with the septoplasty surgery  . Eosinophilic esophagitis    heartburn and reflux  . Family history of adverse reaction to anesthesia    mother respiratory failure  . Nasal fracture    deviated septum  . Pneumonia    1x history of  . Premature baby    2 months early  . Seizures ([place])    well controlled on meds/ one 2 months ago when meds were late    Past Surgical History:  Procedure Laterality Date  . ADENOIDECTOMY    . CLOSED REDUCTION NASAL FRACTURE N/A 08/31/2017   Procedure: CLOSED REDUCTION NASAL FRACTURE;  Surgeon: Clyde Canterbury, MD;  Location: [place];  Service: ENT;  Laterality: N/A;  . SEPTOPLASTY N/A 08/31/2017   Procedure: SEPTOPLASTY;  Surgeon: Clyde Canterbury, MD;  Location: [place];  Service: ENT;  Laterality: N/A;  . SHOULDER ARTHROSCOPY WITH LABRAL REPAIR Left 04/30/2020   Procedure: LEFT SHOULDER POSTERIOR LABRAL REPAIR WITH ARTHROSCOPY, BICEPS TENDON RELEASE AND TENODESIS, OPEN ALLOGRAFT FOR REVERSE BANKART LESION;  Surgeon: [name], MD;  Location: [place];  Service: Orthopedics;  Laterality: Left;  .  TONSILLECTOMY     and addenoids    There were no vitals filed for this visit.   Subjective Assessment - 08/02/20 1307    Subjective Pt. indicated no pain or complaints from last visit.    Patient Stated Goals play basketball    Currently in Pain? No/denies                             [facility] Adult PT Treatment/Exercise - 08/02/20 0001      BFR Standing   Standing Limb Occulsion Pressure (mmHg) 140    Standing Exercise Pressure (mmHg) 70    Standing Exercise Prescription Comment cuff size 1      Shoulder Exercises: Prone   Other Prone Exercises prone y, t over ball 2 lbs   BFR 70 mmHG 30x, 3 x 15 each     Shoulder Exercises: Standing   Other Standing Exercises blue band ER c flexion punch 3 x 15   FBR 70 mmHg     Shoulder Exercises: ROM/Strengthening   UBE (Upper Arm Bike) Lvl 3.0 BFR 70 mmHg 5 mins x 2 c 60 second rest between                    PT Short Term Goals -  07/11/20 1426      PT SHORT TERM GOAL #1   Title independent with initial HEP    Baseline 12/16: updated today to progress AROM/strengthening    Status On-going    Target Date 07/22/20             PT Long Term Goals - 07/11/20 1427      PT LONG TERM GOAL #1   Title Independent with HEP    Status On-going    Target Date 09/06/19      PT LONG TERM GOAL #2   Title Improve Lt shoulder AROM to WNL for improved function    Status On-going      PT LONG TERM GOAL #3   Title FOTO score improved to 82    Status On-going      PT LONG TERM GOAL #4   Title Demonstrate 5/5 Lt shoulder strength for improved function    Status On-going                 Plan - 08/02/20 1334    Clinical Impression Statement Generalized fatigue noted in strengthening intervention but overall good tolerance to intervention c occassional compensatory movements noted and addressed c corrective cues.    Personal Factors and Comorbidities Age;Behavior Pattern;Comorbidity 1    Comorbidities  seizures, reports not following MD precautions/recommendations    Examination-Activity Limitations Lift;Reach Overhead    Examination-Participation Restrictions Community Activity;School;Other   sports   Stability/Clinical Decision Making Evolving/Moderate complexity    Rehab Potential Good    PT Frequency 2x / week   1-2x/wk x 8 weeks; no follow up until after MD appt and can progress activity   PT Duration 8 weeks    PT Treatment/Interventions ADLs/Self Care Home Management;Cryotherapy;Electrical Stimulation;Moist Heat;Therapeutic exercise;Therapeutic activities;Ultrasound;Patient/family education;Manual techniques;Taping;Dry needling;Passive range of motion    PT Next Visit Plan progress strength, focus on end range, contiue BFR    PT Home Exercise Plan Access Code: [email or web address]    Consulted and Agree with Plan of Care Patient           Patient will benefit from skilled therapeutic intervention in order to improve the following deficits and impairments:  Decreased strength,Postural dysfunction,Decreased range of motion,Decreased knowledge of precautions  Visit Diagnosis: Stiffness of left shoulder, not elsewhere classified  Muscle weakness (generalized)  Abnormal posture     Problem List Patient Active Problem List   Diagnosis Date Noted  . Esophagitis determined by endoscopy 07/24/2020  . Labral tear of shoulder, left, subsequent encounter 02/13/2020  . Shoulder dislocation, left, subsequent encounter 02/13/2020  . Seizure disorder ([place]) 12/22/2019  . Migraines 12/22/2019  . Mild intermittent asthma 12/22/2019  . Migraine without aura and without status migrainosus, not intractable 06/29/2019  . 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 Altered mental status 03/12/2014  . Lethargic 03/09/2014  . Environmental allergies 03/01/2014  . Gastroesophageal reflux disease with esophagitis 10/06/2013  . Localization-related (focal) (partial) symptomatic epilepsy and epileptic syndromes with  complex partial seizures, not intractable, without status epilepticus ([place]) 10/06/2013  . Spells 07/26/2013  . Allergy to other foods 07/13/2013  . Prophylactic immunotherapy 07/13/2013  . Unspecified asthma, uncomplicated 123456  . Eosinophilic esophagitis 123456  . Gastroesophageal reflux 01/23/2013  . Vomiting 01/23/2013  . Leukopenia 10/19/2012  . Muscle jerks during sleep 10/19/2012  . Constipation 08/20/2012  . Neutropenia ([place]) 08/20/2012  . Allergic rhinitis due to pollen 02/19/2011    Scot Jun, PT, DPT, OCS, ATC 08/02/20  1:35 PM    [facility] [address] [place], [place], [address] Phone: [phone]   Fax:  [phone]  Name: Terrence Riley MRN: [id number] Date of Birth: 2001/01/05

## 2020-08-06 ENCOUNTER — Other Ambulatory Visit: Payer: Self-pay

## 2020-08-06 ENCOUNTER — Ambulatory Visit (INDEPENDENT_AMBULATORY_CARE_PROVIDER_SITE_OTHER): Payer: Self-pay | Admitting: Rehabilitative and Restorative Service Providers"

## 2020-08-06 ENCOUNTER — Encounter: Payer: Self-pay | Admitting: Rehabilitative and Restorative Service Providers"

## 2020-08-06 DIAGNOSIS — M6281 Muscle weakness (generalized): Secondary | ICD-10-CM

## 2020-08-06 DIAGNOSIS — R293 Abnormal posture: Secondary | ICD-10-CM

## 2020-08-06 DIAGNOSIS — M25612 Stiffness of left shoulder, not elsewhere classified: Secondary | ICD-10-CM

## 2020-08-06 NOTE — Therapy (Signed)
[facility] [place], [place], [address] Phone: [phone]   Fax:  [phone]  Physical Therapy Treatment  Patient Details  Name: Terrence Riley MRN: [id number] Date of Birth: 01/16/2001 Referring Provider (PT): [name], MD   Encounter Date: 08/06/2020   PT End of Session - 08/06/20 1439    Visit Number 6    Number of Visits 18    Date for PT Re-Evaluation 09/05/20    Progress Note Due on Visit 10    PT Start Time 1430    PT Stop Time 1510    PT Time Calculation (min) 40 min    Activity Tolerance Patient tolerated treatment well    Behavior During Therapy [facility] for tasks assessed/performed           Past Medical History:  Diagnosis Date  . Allergy    rhinitis  . Asthma    as a child  . Complication of anesthesia    pt. reports he need more anesthesia with the septoplasty surgery  . Eosinophilic esophagitis    heartburn and reflux  . Family history of adverse reaction to anesthesia    mother respiratory failure  . Nasal fracture    deviated septum  . Pneumonia    1x history of  . Premature baby    2 months early  . Seizures ([place])    well controlled on meds/ one 2 months ago when meds were late    Past Surgical History:  Procedure Laterality Date  . ADENOIDECTOMY    . CLOSED REDUCTION NASAL FRACTURE N/A 08/31/2017   Procedure: CLOSED REDUCTION NASAL FRACTURE;  Surgeon: Clyde Canterbury, MD;  Location: [place];  Service: ENT;  Laterality: N/A;  . SEPTOPLASTY N/A 08/31/2017   Procedure: SEPTOPLASTY;  Surgeon: Clyde Canterbury, MD;  Location: [place];  Service: ENT;  Laterality: N/A;  . SHOULDER ARTHROSCOPY WITH LABRAL REPAIR Left 04/30/2020   Procedure: LEFT SHOULDER POSTERIOR LABRAL REPAIR WITH ARTHROSCOPY, BICEPS TENDON RELEASE AND TENODESIS, OPEN ALLOGRAFT FOR REVERSE BANKART LESION;  Surgeon: [name], MD;  Location: [place];  Service: Orthopedics;  Laterality: Left;  .  TONSILLECTOMY     and addenoids    There were no vitals filed for this visit.   Subjective Assessment - 08/06/20 1438    Subjective No specific pain complaints indicated.  Some mild soreness was reported after last visit.    Patient Stated Goals play basketball    Currently in Pain? No/denies    Pain Score 0-No pain                             [facility] Adult PT Treatment/Exercise - 08/06/20 0001      BFR Standing   Standing Limb Occulsion Pressure (mmHg) 145    Standing Exercise Prescription Comment cuff size 1      Shoulder Exercises: Standing   Other Standing Exercises wall push up plus BFR 80 mmHg 30x, 3 x 15    Other Standing Exercises 3 lb statue of liberty 90/90 trunk rotations 3 x 10 Lt UE      Shoulder Exercises: ROM/Strengthening   UBE (Upper Arm Bike) Lvl 3. 0 5 mins fwd 80 mmHg    Other ROM/Strengthening Exercises single arm triceps 10 lbs BFR 80 mmHg 25x, 3 x 15      Shoulder Exercises: Stretch   Other Shoulder Stretches doorway Er stretch 90/90 30 sec x  5                    PT Short Term Goals - 07/11/20 1426      PT SHORT TERM GOAL #1   Title independent with initial HEP    Baseline 12/16: updated today to progress AROM/strengthening    Status On-going    Target Date 07/22/20             PT Long Term Goals - 07/11/20 1427      PT LONG TERM GOAL #1   Title Independent with HEP    Status On-going    Target Date 09/06/19      PT LONG TERM GOAL #2   Title Improve Lt shoulder AROM to WNL for improved function    Status On-going      PT LONG TERM GOAL #3   Title FOTO score improved to 82    Status On-going      PT LONG TERM GOAL #4   Title Demonstrate 5/5 Lt shoulder strength for improved function    Status On-going                 Plan - 08/06/20 1501    Clinical Impression Statement Fatigue c overhead stabilizations activity.  ER mobility limited and addressed c stretching for home use to promote mobility in  ER, specifically in abduction.    Personal Factors and Comorbidities Age;Behavior Pattern;Comorbidity 1    Comorbidities seizures, reports not following MD precautions/recommendations    Examination-Activity Limitations Lift;Reach Overhead    Examination-Participation Restrictions Community Activity;School;Other   sports   Stability/Clinical Decision Making Evolving/Moderate complexity    Rehab Potential Good    PT Frequency 2x / week   1-2x/wk x 8 weeks; no follow up until after MD appt and can progress activity   PT Duration 8 weeks    PT Treatment/Interventions ADLs/Self Care Home Management;Cryotherapy;Electrical Stimulation;Moist Heat;Therapeutic exercise;Therapeutic activities;Ultrasound;Patient/family education;Manual techniques;Taping;Dry needling;Passive range of motion    PT Next Visit Plan progress strength, focus on end range, contiue BFR    PT Home Exercise Plan Access Code: [email or web address]    Consulted and Agree with Plan of Care Patient           Patient will benefit from skilled therapeutic intervention in order to improve the following deficits and impairments:  Decreased strength,Postural dysfunction,Decreased range of motion,Decreased knowledge of precautions  Visit Diagnosis: Stiffness of left shoulder, not elsewhere classified  Muscle weakness (generalized)  Abnormal posture     Problem List Patient Active Problem List   Diagnosis Date Noted  . Esophagitis determined by endoscopy 07/24/2020  . Labral tear of shoulder, left, subsequent encounter 02/13/2020  . Shoulder dislocation, left, subsequent encounter 02/13/2020  . Seizure disorder ([place]) 12/22/2019  . Migraines 12/22/2019  . Mild intermittent asthma 12/22/2019  . Migraine without aura and without status migrainosus, not intractable 06/29/2019  . 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 Altered mental status 03/12/2014  . Lethargic 03/09/2014  . Environmental allergies 03/01/2014  . Gastroesophageal reflux  disease with esophagitis 10/06/2013  . Localization-related (focal) (partial) symptomatic epilepsy and epileptic syndromes with complex partial seizures, not intractable, without status epilepticus ([place]) 10/06/2013  . Spells 07/26/2013  . Allergy to other foods 07/13/2013  . Prophylactic immunotherapy 07/13/2013  . Unspecified asthma, uncomplicated 87/56/4332  . Eosinophilic esophagitis 95/18/8416  . Gastroesophageal reflux 01/23/2013  . Vomiting 01/23/2013  . Leukopenia 10/19/2012  . Muscle jerks during sleep 10/19/2012  . Constipation 08/20/2012  . Neutropenia ([place])  08/20/2012  . Allergic rhinitis due to pollen 02/19/2011   Scot Jun, PT, DPT, OCS, ATC 08/06/20  3:03 PM    [facility] [address] [place], [place], [address] Phone: [phone]   Fax:  [phone]  Name: Zahir Mominee MRN: [id number] Date of Birth: 2001/06/28

## 2020-08-08 ENCOUNTER — Ambulatory Visit (INDEPENDENT_AMBULATORY_CARE_PROVIDER_SITE_OTHER): Payer: Medicaid Other | Admitting: Rehabilitative and Restorative Service Providers"

## 2020-08-08 ENCOUNTER — Other Ambulatory Visit: Payer: Self-pay

## 2020-08-08 ENCOUNTER — Encounter: Payer: Self-pay | Admitting: Rehabilitative and Restorative Service Providers"

## 2020-08-08 DIAGNOSIS — M6281 Muscle weakness (generalized): Secondary | ICD-10-CM | POA: Diagnosis not present

## 2020-08-08 DIAGNOSIS — M25612 Stiffness of left shoulder, not elsewhere classified: Secondary | ICD-10-CM

## 2020-08-08 DIAGNOSIS — R293 Abnormal posture: Secondary | ICD-10-CM | POA: Diagnosis not present

## 2020-08-08 NOTE — Therapy (Signed)
[facility] [place] [place] [place], [place], [address] Phone: [phone]   Fax:  [phone]  Physical Therapy Treatment  Patient Details  Name: Terrence Riley MRN: [id number] Date of Birth: 02/15/2001 Referring Provider (PT): [name], MD   Encounter Date: 08/08/2020   PT End of Session - 08/08/20 1435    Visit Number 7    Number of Visits 18    Date for PT Re-Evaluation 09/05/20    Progress Note Due on Visit 10    PT Start Time 1429    PT Stop Time 1507    PT Time Calculation (min) 38 min    Activity Tolerance Patient tolerated treatment well    Behavior During Therapy [facility] for tasks assessed/performed           Past Medical History:  Diagnosis Date  . Allergy    rhinitis  . Asthma    as a child  . Complication of anesthesia    pt. reports he need more anesthesia with the septoplasty surgery  . Eosinophilic esophagitis    heartburn and reflux  . Family history of adverse reaction to anesthesia    mother respiratory failure  . Nasal fracture    deviated septum  . Pneumonia    1x history of  . Premature baby    2 months early  . Seizures ([place])    well controlled on meds/ one 2 months ago when meds were late    Past Surgical History:  Procedure Laterality Date  . ADENOIDECTOMY    . CLOSED REDUCTION NASAL FRACTURE N/A 08/31/2017   Procedure: CLOSED REDUCTION NASAL FRACTURE;  Surgeon: Clyde Canterbury, MD;  Location: [place];  Service: ENT;  Laterality: N/A;  . SEPTOPLASTY N/A 08/31/2017   Procedure: SEPTOPLASTY;  Surgeon: Clyde Canterbury, MD;  Location: [place];  Service: ENT;  Laterality: N/A;  . SHOULDER ARTHROSCOPY WITH LABRAL REPAIR Left 04/30/2020   Procedure: LEFT SHOULDER POSTERIOR LABRAL REPAIR WITH ARTHROSCOPY, BICEPS TENDON RELEASE AND TENODESIS, OPEN ALLOGRAFT FOR REVERSE BANKART LESION;  Surgeon: [name], MD;  Location: [place];  Service: Orthopedics;  Laterality: Left;  .  TONSILLECTOMY     and addenoids    There were no vitals filed for this visit.   Subjective Assessment - 08/08/20 1508    Subjective No complaints indicated    Limitations Lifting    Patient Stated Goals play basketball    Currently in Pain? No/denies                             [facility] Adult PT Treatment/Exercise - 08/08/20 0001      Shoulder Exercises: Standing   Other Standing Exercises abduction c anterior green band resistance 2 x 15 bilateral, standing W c scap retraction green band 3 x 10    Other Standing Exercises doorway 2 lb ball toss at wall in 90/90 3 x 15, 3 lb statue of liberty 90/90 trunk rotation 3 x 10      Shoulder Exercises: Therapy Ball   Other Therapy Ball Exercises 90/90 rebounder (by clinician help) Lt UE reactive ball toss 40x      Shoulder Exercises: ROM/Strengthening   UBE (Upper Arm Bike) Lvl 4 3 mins fwd/back each way      Shoulder Exercises: Stretch   Other Shoulder Stretches doorway Er stretch 90/90 30 sec x 5  PT Short Term Goals - 07/11/20 1426      PT SHORT TERM GOAL #1   Title independent with initial HEP    Baseline 12/16: updated today to progress AROM/strengthening    Status On-going    Target Date 07/22/20             PT Long Term Goals - 07/11/20 1427      PT LONG TERM GOAL #1   Title Independent with HEP    Status On-going    Target Date 09/06/19      PT LONG TERM GOAL #2   Title Improve Lt shoulder AROM to WNL for improved function    Status On-going      PT LONG TERM GOAL #3   Title FOTO score improved to 82    Status On-going      PT LONG TERM GOAL #4   Title Demonstrate 5/5 Lt shoulder strength for improved function    Status On-going                 Plan - 08/08/20 1452    Clinical Impression Statement Adherence to new HEP stretching not as expected.  May continue to benefit from progression of scapular control and stabilization intervention in mid and  overhead range movement.    Personal Factors and Comorbidities Age;Behavior Pattern;Comorbidity 1    Comorbidities seizures, reports not following MD precautions/recommendations    Examination-Activity Limitations Lift;Reach Overhead    Examination-Participation Restrictions Community Activity;School;Other   sports   Stability/Clinical Decision Making Evolving/Moderate complexity    Rehab Potential Good    PT Frequency 2x / week   1-2x/wk x 8 weeks; no follow up until after MD appt and can progress activity   PT Duration 8 weeks    PT Treatment/Interventions ADLs/Self Care Home Management;Cryotherapy;Electrical Stimulation;Moist Heat;Therapeutic exercise;Therapeutic activities;Ultrasound;Patient/family education;Manual techniques;Taping;Dry needling;Passive range of motion    PT Next Visit Plan progress strength, focus on end range, contiue BFR    PT Home Exercise Plan Access Code: [email or web address]    Consulted and Agree with Plan of Care Patient           Patient will benefit from skilled therapeutic intervention in order to improve the following deficits and impairments:  Decreased strength,Postural dysfunction,Decreased range of motion,Decreased knowledge of precautions  Visit Diagnosis: Stiffness of left shoulder, not elsewhere classified  Muscle weakness (generalized)  Abnormal posture     Problem List Patient Active Problem List   Diagnosis Date Noted  . Esophagitis determined by endoscopy 07/24/2020  . Labral tear of shoulder, left, subsequent encounter 02/13/2020  . Shoulder dislocation, left, subsequent encounter 02/13/2020  . Seizure disorder ([place]) 12/22/2019  . Migraines 12/22/2019  . Mild intermittent asthma 12/22/2019  . Migraine without aura and without status migrainosus, not intractable 06/29/2019  . 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 Altered mental status 03/12/2014  . Lethargic 03/09/2014  . Environmental allergies 03/01/2014  . Gastroesophageal reflux disease with  esophagitis 10/06/2013  . Localization-related (focal) (partial) symptomatic epilepsy and epileptic syndromes with complex partial seizures, not intractable, without status epilepticus ([place]) 10/06/2013  . Spells 07/26/2013  . Allergy to other foods 07/13/2013  . Prophylactic immunotherapy 07/13/2013  . Unspecified asthma, uncomplicated 74/25/9563  . Eosinophilic esophagitis 87/56/4332  . Gastroesophageal reflux 01/23/2013  . Vomiting 01/23/2013  . Leukopenia 10/19/2012  . Muscle jerks during sleep 10/19/2012  . Constipation 08/20/2012  . Neutropenia ([place]) 08/20/2012  . Allergic rhinitis due to pollen 02/19/2011    Scot Jun, PT, DPT, OCS,  ATC 08/08/20  3:08 PM    [facility] [address] [place], [place], [address] Phone: [phone]   Fax:  [phone]  Name: Drury Gamarra MRN: [id number] Date of Birth: 19-Dec-2000

## 2020-08-09 ENCOUNTER — Emergency Department (HOSPITAL_COMMUNITY)
Admission: EM | Admit: 2020-08-09 | Discharge: 2020-08-10 | Disposition: A | Discharge status: Home / Self Care | Payer: Commercial Managed Care - PPO | Attending: Emergency Medicine | Admitting: Emergency Medicine

## 2020-08-09 ENCOUNTER — Other Ambulatory Visit: Payer: Self-pay

## 2020-08-09 DIAGNOSIS — J4520 Mild intermittent asthma, uncomplicated: Secondary | ICD-10-CM | POA: Diagnosis not present

## 2020-08-09 DIAGNOSIS — Z09 Encounter for follow-up examination after completed treatment for conditions other than malignant neoplasm: Secondary | ICD-10-CM | POA: Diagnosis present

## 2020-08-09 NOTE — ED Triage Notes (Signed)
Patient here from home reporting recent surgery with outpatient rehab. States that he is here to see if he will be able to play in a basketball game tomorrow.

## 2020-08-10 ENCOUNTER — Encounter (HOSPITAL_COMMUNITY): Payer: Self-pay | Admitting: Emergency Medicine

## 2020-08-10 NOTE — ED Provider Notes (Signed)
[facility] DEPT Provider Note   CSN: [id number] Arrival date & time: 08/09/20  2348   Time seen 12:10 AM  History Chief Complaint  Patient presents with  . Follow-up    Terrence Riley is a 20 y.o. male.  HPI   Patient states he had left shoulder surgery done on October 5 by Dr. Marlou Sa, orthopedist. Patient is right-handed. He states he has been going to physical therapy and he is scheduled to go through February 10. He states it has been going "excellent". 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 He states his coach told him if he got a release he could play in the game tomorrow. He states he called Dr. Randel Pigg office and they told him the ER could release him to play basketball tomorrow.  PCP Burchette, Alinda Sierras, MD Orthopedics Dr. Alphonzo Severance  Past Medical History:  Diagnosis Date  . Allergy    rhinitis  . Asthma    as a child  . Complication of anesthesia    pt. reports he need more anesthesia with the septoplasty surgery  . Eosinophilic esophagitis    heartburn and reflux  . Family history of adverse reaction to anesthesia    mother respiratory failure  . Nasal fracture    deviated septum  . Pneumonia    1x history of  . Premature baby    2 months early  . Seizures ([place])    well controlled on meds/ one 2 months ago when meds were late    Patient Active Problem List   Diagnosis Date Noted  . Esophagitis determined by endoscopy 07/24/2020  . Labral tear of shoulder, left, subsequent encounter 02/13/2020  . Shoulder dislocation, left, subsequent encounter 02/13/2020  . Seizure disorder ([place]) 12/22/2019  . Migraines 12/22/2019  . Mild intermittent asthma 12/22/2019  . Migraine without aura and without status migrainosus, not intractable 06/29/2019  . 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 Altered mental status 03/12/2014  . Lethargic 03/09/2014  . Environmental allergies  03/01/2014  . Gastroesophageal reflux disease with esophagitis 10/06/2013  . Localization-related (focal) (partial) symptomatic epilepsy and epileptic syndromes with complex partial seizures, not intractable, without status epilepticus ([place]) 10/06/2013  . Spells 07/26/2013  . Allergy to other foods 07/13/2013  . Prophylactic immunotherapy 07/13/2013  . Unspecified asthma, uncomplicated 46/27/0350  . Eosinophilic esophagitis 09/38/1829  . Gastroesophageal reflux 01/23/2013  . Vomiting 01/23/2013  . Leukopenia 10/19/2012  . Muscle jerks during sleep 10/19/2012  . Constipation 08/20/2012  . Neutropenia ([place]) 08/20/2012  . Allergic rhinitis due to pollen 02/19/2011    Past Surgical History:  Procedure Laterality Date  . ADENOIDECTOMY    . CLOSED REDUCTION NASAL FRACTURE N/A 08/31/2017   Procedure: CLOSED REDUCTION NASAL FRACTURE;  Surgeon: Clyde Canterbury, MD;  Location: [place];  Service: ENT;  Laterality: N/A;  . SEPTOPLASTY N/A 08/31/2017   Procedure: SEPTOPLASTY;  Surgeon: Clyde Canterbury, MD;  Location: [place];  Service: ENT;  Laterality: N/A;  . SHOULDER ARTHROSCOPY WITH LABRAL REPAIR Left 04/30/2020   Procedure: LEFT SHOULDER POSTERIOR LABRAL REPAIR WITH ARTHROSCOPY, BICEPS TENDON RELEASE AND TENODESIS, OPEN ALLOGRAFT FOR REVERSE BANKART LESION;  Surgeon: [name], MD;  Location: [place];  Service: Orthopedics;  Laterality: Left;  . TONSILLECTOMY     and addenoids       Family History  Problem Relation Age of Onset  . Cancer Mother  breat  . Protein S deficiency Mother     Social History   Tobacco Use  . Smoking status: Never Smoker  . Smokeless tobacco: Never Used  Vaping Use  . Vaping Use: Never used  Substance Use Topics  . Alcohol use: No  . Drug use: No  College student  Home Medications Prior to Admission medications   Medication Sig Start Date End Date Taking? Authorizing Provider  albuterol (PROVENTIL) (2.5 MG/3ML) 0.083%  nebulizer solution Take 3 mLs (2.5 mg total) by nebulization every 4 (four) hours as needed for wheezing or shortness of breath. 71/69/67   Delora Fuel, MD  albuterol (VENTOLIN HFA) 108 (90 Base) MCG/ACT inhaler Inhale 2 puffs into the lungs every 4 (four) hours as needed for wheezing. 03/12/20   Burchette, Alinda Sierras, MD  cetirizine (ZYRTEC) 10 MG tablet Take 10 mg by mouth daily as needed for allergies.     [provider]  Cholecalciferol 25 MCG (1000 UT) tablet Take 1,000 Units by mouth daily.  06/13/18   [provider]  clonazePAM (KLONOPIN) 1 MG tablet Take 0.5 mg by mouth 2 (two) times daily as needed for anxiety.     [provider]  EPINEPHrine 0.3 mg/0.3 mL IJ SOAJ injection Inject 0.3 mg into the muscle as needed for anaphylaxis. 07/10/20   McDonald, Mia A, PA-C  levETIRAcetam (KEPPRA) 1000 MG tablet Take 2,000 mg by mouth 2 (two) times daily.     [provider]  lidocaine (LIDODERM) 5 % Place 1 patch onto the skin daily. Remove & Discard patch within 12 hours or as directed by MD Patient taking differently: Place 1 patch onto the skin daily as needed (pain). Remove & Discard patch within 12 hours or as directed by MD 02/25/20   Henderly, Britni A, PA-C  methocarbamol (ROBAXIN) 500 MG tablet Take 1 tablet (500 mg total) by mouth 2 (two) times daily. 02/25/20   Henderly, Britni A, PA-C  midazolam (VERSED) 5 MG/ML injection Place 2 mLs into the nose every 5 (five) minutes as needed (seizures). Draw up prescribed dose (ml) in syringe, remove blue vial access device, then attach syringe to nasal atomizer for intranasal administration.    [provider]  naproxen (NAPROSYN) 500 MG tablet Take 1 tablet (500 mg total) by mouth 2 (two) times daily. 02/25/20   Henderly, Britni A, PA-C  oxyCODONE (ROXICODONE) 5 MG immediate release tablet Take 1 tablet (5 mg total) by mouth every 6 (six) hours as needed. 04/30/20 04/30/21  Magnant, Gerrianne Scale, PA-C  PRESCRIPTION  MEDICATION Apply 1 application topically 3 (three) times daily as needed. Nausea - Phenergan Cream-    [provider]  topiramate (TOPAMAX) 200 MG tablet Take 200 mg by mouth 2 (two) times daily.     [provider]    Allergies    Lortab [hydrocodone-acetaminophen], Other, Zofran [ondansetron hcl], Citrus, Dairy aid [lactase], Influenza virus vaccine, Soy allergy, Tylenol [acetaminophen], Eggs or egg-derived products, and Hydrocodone-acetaminophen  Review of Systems   Review of Systems  All other systems reviewed and are negative.   Physical Exam Updated Vital Signs BP 115/80 (BP Location: Right Arm)   Pulse 80   Temp 98 F (36.7 C) (Oral)   Resp 18   SpO2 100%   Physical Exam Vitals and nursing note reviewed.  Constitutional:      General: He is not in acute distress.    Appearance: Normal appearance. He is normal weight. He is not ill-appearing or toxic-appearing.  Eyes:     Extraocular Movements: Extraocular movements intact.     Conjunctiva/sclera: Conjunctivae normal.  Cardiovascular:     Rate and Rhythm: Normal rate.     Pulses: Normal pulses.  Pulmonary:     Effort: Pulmonary effort is normal. No respiratory distress.  Musculoskeletal:        General: Normal range of motion.     Cervical back: Normal range of motion.     Comments: Patient appears nontender in his left shoulder. He appears to have normal range of motion including abduction. He has good distal pulses.  Skin:    General: Skin is warm and dry.  Neurological:     General: No focal deficit present.     Mental Status: He is alert and oriented to person, place, and time.     Cranial Nerves: No cranial nerve deficit.  Psychiatric:        Mood and Affect: Mood normal.        Behavior: Behavior normal.        Thought Content: Thought content normal.     ED Results / Procedures / Treatments   Labs (all labs ordered are listed, but only abnormal results are displayed) Labs Reviewed  - No data to display  EKG None  Radiology No results found.  Procedures Procedures (including critical care time)  Medications Ordered in ED Medications - No data to display  ED Course  I have reviewed the triage vital signs and the nursing notes.  Pertinent labs & imaging results that were available during my care of the patient were reviewed by me and considered in my medical decision making (see chart for details).    MDM Rules/Calculators/A&P                         Patient presents after left shoulder surgery to get released to play basketball tomorrow in a  college game. He is projected to finish his physical therapy on February 10. I have explained to the patient I do not have access to his physical therapy notes, I did not do his surgery and his surgeon needs to release him back to full activity. He seems to understand.   Final Clinical Impression(s) / ED Diagnoses Final diagnoses:  Follow-up exam    Rx / DC Orders ED Discharge Orders    None     Plan discharge  Rolland Porter, MD, Barbette Or, MD 08/10/20 0020

## 2020-08-10 NOTE — Discharge Instructions (Addendum)
You will need to be evaluated by Dr. Marlou Sa to determine if he can go back to playing basketball, or you can have your physical therapist call him while you are at therapy to discuss if you are able to go back to full activity.

## 2020-08-13 ENCOUNTER — Encounter: Payer: Self-pay | Admitting: Rehabilitative and Restorative Service Providers"

## 2020-08-13 ENCOUNTER — Ambulatory Visit (INDEPENDENT_AMBULATORY_CARE_PROVIDER_SITE_OTHER): Payer: Self-pay | Admitting: Rehabilitative and Restorative Service Providers"

## 2020-08-13 DIAGNOSIS — M6281 Muscle weakness (generalized): Secondary | ICD-10-CM

## 2020-08-13 DIAGNOSIS — R293 Abnormal posture: Secondary | ICD-10-CM

## 2020-08-13 DIAGNOSIS — M25612 Stiffness of left shoulder, not elsewhere classified: Secondary | ICD-10-CM

## 2020-08-13 NOTE — Therapy (Signed)
[place], [place], [address] Phone: [phone]   Fax:  [phone]  Physical Therapy Treatment  Patient Details  Name: Terrence Riley MRN: [id number] Date of Birth: 19-May-2001 Referring Provider (PT): [name], MD   Encounter Date: 08/13/2020   PT End of Session - 08/13/20 1439    Visit Number 8    Number of Visits 18    Date for PT Re-Evaluation 09/05/20    Progress Note Due on Visit 10    PT Start Time 6967    PT Stop Time 1507    PT Time Calculation (min) 39 min    Activity Tolerance Patient tolerated treatment well    Behavior During Therapy [facility] for tasks assessed/performed           Past Medical History:  Diagnosis Date  . Allergy    rhinitis  . Asthma    as a child  . Complication of anesthesia    pt. reports he need more anesthesia with the septoplasty surgery  . Eosinophilic esophagitis    heartburn and reflux  . Family history of adverse reaction to anesthesia    mother respiratory failure  . Nasal fracture    deviated septum  . Pneumonia    1x history of  . Premature baby    2 months early  . Seizures ([place])    well controlled on meds/ one 2 months ago when meds were late    Past Surgical History:  Procedure Laterality Date  . ADENOIDECTOMY    . CLOSED REDUCTION NASAL FRACTURE N/A 08/31/2017   Procedure: CLOSED REDUCTION NASAL FRACTURE;  Surgeon: Clyde Canterbury, MD;  Location: [place];  Service: ENT;  Laterality: N/A;  . SEPTOPLASTY N/A 08/31/2017   Procedure: SEPTOPLASTY;  Surgeon: Clyde Canterbury, MD;  Location: [place];  Service: ENT;  Laterality: N/A;  . SHOULDER ARTHROSCOPY WITH LABRAL REPAIR Left 04/30/2020   Procedure: LEFT SHOULDER POSTERIOR LABRAL REPAIR WITH ARTHROSCOPY, BICEPS TENDON RELEASE AND TENODESIS, OPEN ALLOGRAFT FOR REVERSE BANKART LESION;  Surgeon: [name], MD;  Location: [place];  Service: Orthopedics;  Laterality: Left;  .  TONSILLECTOMY     and addenoids    There were no vitals filed for this visit.   Subjective Assessment - 08/13/20 1437    Subjective Pt. stated no pain today.  Went to ER to seek clearance for playing basketball (wasn't given it).  Pt. indicated he called an office and a person told him that he could see the ER for that practice clearance.    Currently in Pain? No/denies    Pain Score 0-No pain                             [facility] Adult PT Treatment/Exercise - 08/13/20 0001      Neuro Re-ed    Neuro Re-ed Details  shuttle drill fwd/lateral dribbling 5 mins, shuttle drill passing c directional changes verbally 30 sec x 5, push up positioning colored cone touches by verbal cues x 3 bilateral c knees on foam on floor      Shoulder Exercises: Standing   Other Standing Exercises Lt UE 90/90 basketball bounces on wall 30 sec x 5    Other Standing Exercises 20x statue of liberty 90/90 4 lbs                    PT Short Term Goals -  07/11/20 1426      PT SHORT TERM GOAL #1   Title independent with initial HEP    Baseline 12/16: updated today to progress AROM/strengthening    Status On-going    Target Date 07/22/20             PT Long Term Goals - 07/11/20 1427      PT LONG TERM GOAL #1   Title Independent with HEP    Status On-going    Target Date 09/06/19      PT LONG TERM GOAL #2   Title Improve Lt shoulder AROM to WNL for improved function    Status On-going      PT LONG TERM GOAL #3   Title FOTO score improved to 82    Status On-going      PT LONG TERM GOAL #4   Title Demonstrate 5/5 Lt shoulder strength for improved function    Status On-going                 Plan - 08/13/20 1508    Clinical Impression Statement Transitioning to neuro re-ed and early basketball specific tasks today met c fatigue but no pain or obvious maladaptive compenstatory movements.  Pt. to continue to benefit from build up of strength and motor control in  space for improved function in daily and recreational tasks.    Personal Factors and Comorbidities Age;Behavior Pattern;Comorbidity 1    Comorbidities seizures, reports not following MD precautions/recommendations    Examination-Activity Limitations Lift;Reach Overhead    Examination-Participation Restrictions Community Activity;School;Other   sports   Stability/Clinical Decision Making Evolving/Moderate complexity    Rehab Potential Good    PT Frequency 2x / week   1-2x/wk x 8 weeks; no follow up until after MD appt and can progress activity   PT Duration 8 weeks    PT Treatment/Interventions ADLs/Self Care Home Management;Cryotherapy;Electrical Stimulation;Moist Heat;Therapeutic exercise;Therapeutic activities;Ultrasound;Patient/family education;Manual techniques;Taping;Dry needling;Passive range of motion    PT Next Visit Plan neuro re-ed control intervention, progress note next visit    PT Home Exercise Plan Access Code: [email or web address]    Consulted and Agree with Plan of Care Patient           Patient will benefit from skilled therapeutic intervention in order to improve the following deficits and impairments:  Decreased strength,Postural dysfunction,Decreased range of motion,Decreased knowledge of precautions  Visit Diagnosis: Stiffness of left shoulder, not elsewhere classified  Muscle weakness (generalized)  Abnormal posture     Problem List Patient Active Problem List   Diagnosis Date Noted  . Esophagitis determined by endoscopy 07/24/2020  . Labral tear of shoulder, left, subsequent encounter 02/13/2020  . Shoulder dislocation, left, subsequent encounter 02/13/2020  . Seizure disorder ([place]) 12/22/2019  . Migraines 12/22/2019  . Mild intermittent asthma 12/22/2019  . Migraine without aura and without status migrainosus, not intractable 06/29/2019  . 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 Altered mental status 03/12/2014  . Lethargic 03/09/2014  . Environmental allergies  03/01/2014  . Gastroesophageal reflux disease with esophagitis 10/06/2013  . Localization-related (focal) (partial) symptomatic epilepsy and epileptic syndromes with complex partial seizures, not intractable, without status epilepticus ([place]) 10/06/2013  . Spells 07/26/2013  . Allergy to other foods 07/13/2013  . Prophylactic immunotherapy 07/13/2013  . Unspecified asthma, uncomplicated 54/00/8676  . Eosinophilic esophagitis 19/50/9326  . Gastroesophageal reflux 01/23/2013  . Vomiting 01/23/2013  . Leukopenia 10/19/2012  . Muscle jerks during sleep 10/19/2012  . Constipation 08/20/2012  . Neutropenia ([place]) 08/20/2012  . Allergic  rhinitis due to pollen 02/19/2011    Scot Jun, PT, DPT, OCS, ATC 08/13/20  3:12 PM    [facility] [address] [place], [place], [address] Phone: [phone]   Fax:  [phone]  Name: Latrel Szymczak MRN: [id number] Date of Birth: 11-May-2001

## 2020-08-15 ENCOUNTER — Encounter: Payer: Self-pay | Admitting: Rehabilitative and Restorative Service Providers"

## 2020-08-15 ENCOUNTER — Other Ambulatory Visit: Payer: Self-pay

## 2020-08-15 ENCOUNTER — Ambulatory Visit (INDEPENDENT_AMBULATORY_CARE_PROVIDER_SITE_OTHER): Payer: Self-pay | Admitting: Rehabilitative and Restorative Service Providers"

## 2020-08-15 DIAGNOSIS — M6281 Muscle weakness (generalized): Secondary | ICD-10-CM

## 2020-08-15 DIAGNOSIS — M25612 Stiffness of left shoulder, not elsewhere classified: Secondary | ICD-10-CM

## 2020-08-15 DIAGNOSIS — R293 Abnormal posture: Secondary | ICD-10-CM

## 2020-08-15 NOTE — Therapy (Signed)
[place], [place], [address] Phone: [phone]   Fax:  [phone]  Physical Therapy Treatment/Discharge  Patient Details  Name: Terrence Riley MRN: [id number] Date of Birth: 2000-11-27 Referring Provider (PT): [name], MD   Encounter Date: 08/15/2020   PT End of Session - 08/15/20 1436    Visit Number 9    Number of Visits 18    Date for PT Re-Evaluation 09/05/20    Progress Note Due on Visit 19    PT Start Time 1431    PT Stop Time 1510    PT Time Calculation (min) 39 min    Activity Tolerance Patient tolerated treatment well    Behavior During Therapy [facility] for tasks assessed/performed           Past Medical History:  Diagnosis Date  . Allergy    rhinitis  . Asthma    as a child  . Complication of anesthesia    pt. reports he need more anesthesia with the septoplasty surgery  . Eosinophilic esophagitis    heartburn and reflux  . Family history of adverse reaction to anesthesia    mother respiratory failure  . Nasal fracture    deviated septum  . Pneumonia    1x history of  . Premature baby    2 months early  . Seizures ([place])    well controlled on meds/ one 2 months ago when meds were late    Past Surgical History:  Procedure Laterality Date  . ADENOIDECTOMY    . CLOSED REDUCTION NASAL FRACTURE N/A 08/31/2017   Procedure: CLOSED REDUCTION NASAL FRACTURE;  Surgeon: Clyde Canterbury, MD;  Location: [place];  Service: ENT;  Laterality: N/A;  . SEPTOPLASTY N/A 08/31/2017   Procedure: SEPTOPLASTY;  Surgeon: Clyde Canterbury, MD;  Location: [place];  Service: ENT;  Laterality: N/A;  . SHOULDER ARTHROSCOPY WITH LABRAL REPAIR Left 04/30/2020   Procedure: LEFT SHOULDER POSTERIOR LABRAL REPAIR WITH ARTHROSCOPY, BICEPS TENDON RELEASE AND TENODESIS, OPEN ALLOGRAFT FOR REVERSE BANKART LESION;  Surgeon: [name], MD;  Location: [place];  Service: Orthopedics;  Laterality:  Left;  . TONSILLECTOMY     and addenoids    There were no vitals filed for this visit.   Subjective Assessment - 08/15/20 1435    Subjective No complaints of pain indicated.  Reported overall improvement to              [facility] PT Assessment - 08/15/20 0001      Assessment   Medical Diagnosis S43.432D (ICD-10-CM) - Labral tear of shoulder, left, subsequent encounter    Referring Provider (PT) [name], MD    Onset Date/Surgical Date 04/30/20    Hand Dominance Right    Next MD Visit 08/22/19    Prior Therapy none      Observation/Other Assessments   Focus on Therapeutic Outcomes (FOTO)  86      AROM   Overall AROM Comments Elevation WFL, ER in 90/90 80 degrees      Strength   Overall Strength Comments Lt lower trap 4+/5, middle trap 4/5, SA 5/5    Strength Assessment Site Shoulder    Right/Left Shoulder Left    Left Shoulder Flexion 5/5    Left Shoulder ABduction 4+/5    Left Shoulder Internal Rotation 5/5    Left Shoulder External Rotation 5/5  [facility] Adult PT Treatment/Exercise - 08/15/20 0001      Shoulder Exercises: Prone   Other Prone Exercises prone y, t 3 x 15 each 2 lbs Lt UE      Shoulder Exercises: Standing   Other Standing Exercises Lt UE 90/90 2 lb ball bounces on wall 30 sec x 5    Other Standing Exercises standing push up at wall hold c 3 point lateral movement blue band x 15 each bilateral                    PT Short Term Goals - 07/11/20 1426      PT SHORT TERM GOAL #1   Title independent with initial HEP    Baseline 12/16: updated today to progress AROM/strengthening    Status On-going    Target Date 07/22/20             PT Long Term Goals - 08/15/20 1459      PT LONG TERM GOAL #1   Title Independent with HEP    Baseline 08/15/2020: cues still required to ensure complete performance    Status Partially Met      PT LONG TERM GOAL #2   Title Improve Lt shoulder AROM to WNL for  improved function    Baseline 08/15/2020: ER limitation, horizontal abd limitation    Status Partially Met      PT LONG TERM GOAL #3   Title FOTO score improved to 82    Status Achieved      PT LONG TERM GOAL #4   Title Demonstrate 5/5 Lt shoulder strength for improved function    Status Partially Met                 Plan - 08/15/20 1436    Clinical Impression Statement Pt. has attended 9 visits overall during course of treatment.  See objective data for updated data.  Pt. may continue to benefit from further strengthening and neuromuscular coordination improvements to facilitate full return to basketball upon MD approval.  Pt. can achieve this c use of HEP.  Pt. may be apprporiate for continued skilled PT services to continue improvement as well but clinic is not approved for Pt.'s medicaid related insurance.    Personal Factors and Comorbidities Age;Behavior Pattern;Comorbidity 1    Comorbidities seizures, reports not following MD precautions/recommendations    Examination-Activity Limitations Lift;Reach Overhead    Examination-Participation Restrictions Community Activity;School;Other   sports   Stability/Clinical Decision Making Evolving/Moderate complexity    Rehab Potential Good    PT Frequency 2x / week   1-2x/wk x 8 weeks; no follow up until after MD appt and can progress activity   PT Duration 8 weeks    PT Treatment/Interventions ADLs/Self Care Home Management;Cryotherapy;Electrical Stimulation;Moist Heat;Therapeutic exercise;Therapeutic activities;Ultrasound;Patient/family education;Manual techniques;Taping;Dry needling;Passive range of motion    PT Next Visit Plan Return to MD next week.    PT Home Exercise Plan Access Code: [email or web address]    Consulted and Agree with Plan of Care Patient           Patient will benefit from skilled therapeutic intervention in order to improve the following deficits and impairments:  Decreased strength,Postural dysfunction,Decreased range  of motion,Decreased knowledge of precautions  Visit Diagnosis: Stiffness of left shoulder, not elsewhere classified  Muscle weakness (generalized)  Abnormal posture     Problem List Patient Active Problem List   Diagnosis Date Noted  . Esophagitis determined by endoscopy 07/24/2020  . Labral tear of shoulder,  left, subsequent encounter 02/13/2020  . Shoulder dislocation, left, subsequent encounter 02/13/2020  . Seizure disorder ([place]) 12/22/2019  . Migraines 12/22/2019  . Mild intermittent asthma 12/22/2019  . Migraine without aura and without status migrainosus, not intractable 06/29/2019  . 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 Altered mental status 03/12/2014  . Lethargic 03/09/2014  . Environmental allergies 03/01/2014  . Gastroesophageal reflux disease with esophagitis 10/06/2013  . Localization-related (focal) (partial) symptomatic epilepsy and epileptic syndromes with complex partial seizures, not intractable, without status epilepticus ([place]) 10/06/2013  . Spells 07/26/2013  . Allergy to other foods 07/13/2013  . Prophylactic immunotherapy 07/13/2013  . Unspecified asthma, uncomplicated 12/39/3594  . Eosinophilic esophagitis 04/01/255  . Gastroesophageal reflux 01/23/2013  . Vomiting 01/23/2013  . Leukopenia 10/19/2012  . Muscle jerks during sleep 10/19/2012  . Constipation 08/20/2012  . Neutropenia ([place]) 08/20/2012  . Allergic rhinitis due to pollen 02/19/2011   PHYSICAL THERAPY DISCHARGE SUMMARY  Visits from Start of Care: 9  Current functional level related to goals / functional outcomes: See note   Remaining deficits: See note   Education / Equipment: HEP Plan: Patient agrees to discharge.  Patient goals were partially met. Patient is being discharged due to                                                     ?????   D/C due to an insurance not accepted by clinic   Scot Jun, PT, DPT, OCS, ATC 08/15/20  3:04 PM     [facility] [address] [place], [place], [address] Phone: [phone]   Fax:  [phone]  Name: Terrence Riley MRN: [id number] Date of Birth: May 09, 2001

## 2020-08-21 ENCOUNTER — Ambulatory Visit: Payer: Commercial Managed Care - PPO | Admitting: Orthopedic Surgery

## 2020-08-21 ENCOUNTER — Ambulatory Visit (INDEPENDENT_AMBULATORY_CARE_PROVIDER_SITE_OTHER): Payer: Commercial Managed Care - PPO | Admitting: Orthopedic Surgery

## 2020-08-21 ENCOUNTER — Ambulatory Visit: Payer: Medicaid Other | Admitting: Orthopedic Surgery

## 2020-08-21 DIAGNOSIS — S43432D Superior glenoid labrum lesion of left shoulder, subsequent encounter: Secondary | ICD-10-CM

## 2020-08-22 ENCOUNTER — Encounter: Payer: Self-pay | Admitting: Orthopedic Surgery

## 2020-08-22 ENCOUNTER — Ambulatory Visit: Payer: Commercial Managed Care - PPO | Admitting: Orthopedic Surgery

## 2020-08-22 NOTE — Progress Notes (Signed)
Post-Op Visit Note   Patient: Terrence Riley           Date of Birth: 10/25/2000           MRN: [id number] Visit Date: 08/21/2020 PCP: Eulas Post, MD   Assessment & Plan:  Chief Complaint:  Chief Complaint  Patient presents with  . Left Shoulder - Follow-up   Visit Diagnoses:  1. Labral tear of shoulder, left, subsequent encounter     Plan: Patient presents now 4 months out left shoulder labral repair and bone grafting for reverse Hill-Sachs lesion.  Patient states he feels good.  He has run out of physical therapy.  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  He has played pickup basketball and he states that his shoulder feels great.  He has been doing band exercises and other movement exercises but no bench press with that left arm.  On examination the shoulder feels stable.  Is actually more stable posteriorly on the left than the right.  Subscap strength 5+ out of 5 bilaterally.  No coarse grinding or crepitus.  Plan at this time is okay for him to return to basketball.  4 months out with nearly full range of motion lacking only about 20 degrees of forward flexion.  Also has about 50 degrees of external rotation of 15 degrees of abduction which is good with solid endpoint.  I think the subscap is healed as well.  Cautioned him against activities that put posterior stress on the shoulder.  Follow-up with [place] as needed.  Follow-Up Instructions: No follow-ups on file.   Orders:  No orders of the defined types were placed in this encounter.  No orders of the defined types were placed in this encounter.   Imaging: No results found.  PMFS History: Patient Active Problem List   Diagnosis Date Noted  . Esophagitis determined by endoscopy 07/24/2020  . Labral tear of shoulder, left, subsequent encounter 02/13/2020  . Shoulder dislocation, left, subsequent encounter 02/13/2020  . Seizure disorder ([place]) 12/22/2019  . Migraines  12/22/2019  . Mild intermittent asthma 12/22/2019  . Migraine without aura and without status migrainosus, not intractable 06/29/2019  . 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 Altered mental status 03/12/2014  . Lethargic 03/09/2014  . Environmental allergies 03/01/2014  . Gastroesophageal reflux disease with esophagitis 10/06/2013  . Localization-related (focal) (partial) symptomatic epilepsy and epileptic syndromes with complex partial seizures, not intractable, without status epilepticus ([place]) 10/06/2013  . Spells 07/26/2013  . Allergy to other foods 07/13/2013  . Prophylactic immunotherapy 07/13/2013  . Unspecified asthma, uncomplicated 78/24/2353  . Eosinophilic esophagitis 61/44/3154  . Gastroesophageal reflux 01/23/2013  . Vomiting 01/23/2013  . Leukopenia 10/19/2012  . Muscle jerks during sleep 10/19/2012  . Constipation 08/20/2012  . Neutropenia ([place]) 08/20/2012  . Allergic rhinitis due to pollen 02/19/2011   Past Medical History:  Diagnosis Date  . Allergy    rhinitis  . Asthma    as a child  . Complication of anesthesia    pt. reports he need more anesthesia with the septoplasty surgery  . Eosinophilic esophagitis    heartburn and reflux  . Family history of adverse reaction to anesthesia    mother respiratory failure  . Nasal fracture    deviated septum  . Pneumonia    1x history of  . Premature baby    2 months early  . Seizures ([place])    well controlled on meds/ one 2  months ago when meds were late    Family History  Problem Relation Age of Onset  . Cancer Mother        breat  . Protein S deficiency Mother     Past Surgical History:  Procedure Laterality Date  . ADENOIDECTOMY    . CLOSED REDUCTION NASAL FRACTURE N/A 08/31/2017   Procedure: CLOSED REDUCTION NASAL FRACTURE;  Surgeon: Clyde Canterbury, MD;  Location: [place];  Service: ENT;  Laterality: N/A;  . SEPTOPLASTY N/A 08/31/2017   Procedure: SEPTOPLASTY;  Surgeon: Clyde Canterbury, MD;  Location:  [place];  Service: ENT;  Laterality: N/A;  . SHOULDER ARTHROSCOPY WITH LABRAL REPAIR Left 04/30/2020   Procedure: LEFT SHOULDER POSTERIOR LABRAL REPAIR WITH ARTHROSCOPY, BICEPS TENDON RELEASE AND TENODESIS, OPEN ALLOGRAFT FOR REVERSE BANKART LESION;  Surgeon: [name], MD;  Location: [place];  Service: Orthopedics;  Laterality: Left;  . TONSILLECTOMY     and addenoids   Social History   Occupational History  . Not on file  Tobacco Use  . Smoking status: Never Smoker  . Smokeless tobacco: Never Used  Vaping Use  . Vaping Use: Never used  Substance and Sexual Activity  . Alcohol use: No  . Drug use: No  . Sexual activity: Not on file

## 2020-08-28 ENCOUNTER — Ambulatory Visit: Payer: Medicaid Other | Admitting: Orthopedic Surgery

## 2020-09-11 ENCOUNTER — Emergency Department (HOSPITAL_COMMUNITY)
Admission: EM | Admit: 2020-09-11 | Discharge: 2020-09-11 | Disposition: A | Discharge status: Home / Self Care | Payer: Medicaid Other | Attending: Emergency Medicine | Admitting: Emergency Medicine

## 2020-09-11 DIAGNOSIS — X58XXXA Exposure to other specified factors, initial encounter: Secondary | ICD-10-CM | POA: Insufficient documentation

## 2020-09-11 DIAGNOSIS — S01112A Laceration without foreign body of left eyelid and periocular area, initial encounter: Secondary | ICD-10-CM | POA: Insufficient documentation

## 2020-09-11 DIAGNOSIS — J4520 Mild intermittent asthma, uncomplicated: Secondary | ICD-10-CM | POA: Insufficient documentation

## 2020-09-11 DIAGNOSIS — R569 Unspecified convulsions: Secondary | ICD-10-CM | POA: Diagnosis not present

## 2020-09-11 DIAGNOSIS — Z23 Encounter for immunization: Secondary | ICD-10-CM | POA: Diagnosis not present

## 2020-09-11 DIAGNOSIS — Y92009 Unspecified place in unspecified non-institutional (private) residence as the place of occurrence of the external cause: Secondary | ICD-10-CM | POA: Insufficient documentation

## 2020-09-11 DIAGNOSIS — S00202A Unspecified superficial injury of left eyelid and periocular area, initial encounter: Secondary | ICD-10-CM | POA: Diagnosis present

## 2020-09-11 LAB — CBC WITH DIFFERENTIAL/PLATELET
Abs Immature Granulocytes: 0.01 10*3/uL (ref 0.00–0.07)
Basophils Absolute: 0 10*3/uL (ref 0.0–0.1)
Basophils Relative: 0 %
Eosinophils Absolute: 0 10*3/uL (ref 0.0–0.5)
Eosinophils Relative: 0 %
HCT: 41.6 % (ref 39.0–52.0)
Hemoglobin: 13.6 g/dL (ref 13.0–17.0)
Immature Granulocytes: 0 %
Lymphocytes Relative: 10 %
Lymphs Abs: 0.8 10*3/uL (ref 0.7–4.0)
MCH: 28.8 pg (ref 26.0–34.0)
MCHC: 32.7 g/dL (ref 30.0–36.0)
MCV: 87.9 fL (ref 80.0–100.0)
Monocytes Absolute: 0.5 10*3/uL (ref 0.1–1.0)
Monocytes Relative: 7 %
Neutro Abs: 6.7 10*3/uL (ref 1.7–7.7)
Neutrophils Relative %: 83 %
Platelets: 174 10*3/uL (ref 150–400)
RBC: 4.73 MIL/uL (ref 4.22–5.81)
RDW: 12.6 % (ref 11.5–15.5)
WBC: 8.2 10*3/uL (ref 4.0–10.5)
nRBC: 0 % (ref 0.0–0.2)

## 2020-09-11 LAB — BASIC METABOLIC PANEL
Anion gap: 8 (ref 5–15)
BUN: 13 mg/dL (ref 6–20)
CO2: 23 mmol/L (ref 22–32)
Calcium: 9.2 mg/dL (ref 8.9–10.3)
Chloride: 107 mmol/L (ref 98–111)
Creatinine, Ser: 1.1 mg/dL (ref 0.61–1.24)
GFR, Estimated: >60 mL/min (ref >60)
Glucose, Bld: 92 mg/dL (ref 70–99)
Potassium: 3.8 mmol/L (ref 3.5–5.1)
Sodium: 138 mmol/L (ref 135–145)

## 2020-09-11 LAB — RAPID URINE DRUG SCREEN, HOSP PERFORMED
Amphetamines: NOT DETECTED
Barbiturates: NOT DETECTED
Benzodiazepines: NOT DETECTED
Cocaine: NOT DETECTED
Opiates: NOT DETECTED
Tetrahydrocannabinol: NOT DETECTED

## 2020-09-11 LAB — ETHANOL: Alcohol, Ethyl (B): <10 mg/dL (ref <10)

## 2020-09-11 MED ORDER — BACITRACIN ZINC 500 UNIT/GM EX OINT: TOPICAL_OINTMENT | Freq: Every day | CUTANEOUS | Status: DC

## 2020-09-11 MED ORDER — IBUPROFEN 200 MG PO TABS
600.0000 mg | ORAL_TABLET | Freq: Once | ORAL | Status: AC
  Administered 2020-09-11: 600 mg via ORAL
  Filled 2020-09-11: qty 3

## 2020-09-11 MED ORDER — BACITRACIN ZINC 500 UNIT/GM EX OINT
TOPICAL_OINTMENT | Freq: Two times a day (BID) | CUTANEOUS | Status: DC
  Filled 2020-09-11: qty 0.9

## 2020-09-11 MED ORDER — LACTATED RINGERS IV BOLUS
1000.0000 mL | Freq: Once | INTRAVENOUS | Status: AC
  Administered 2020-09-11: 1000 mL via INTRAVENOUS

## 2020-09-11 MED ORDER — SODIUM CHLORIDE 0.9 % IV SOLN
2000.0000 mg | Freq: Once | INTRAVENOUS | Status: AC
  Administered 2020-09-11: 2000 mg via INTRAVENOUS
  Filled 2020-09-11: qty 20

## 2020-09-11 MED ORDER — TETANUS-DIPHTH-ACELL PERTUSSIS 5-2.5-18.5 LF-MCG/0.5 IM SUSY
0.5000 mL | PREFILLED_SYRINGE | Freq: Once | INTRAMUSCULAR | Status: AC
  Administered 2020-09-11: 0.5 mL via INTRAMUSCULAR
  Filled 2020-09-11: qty 0.5

## 2020-09-11 NOTE — ED Notes (Signed)
Seizure pads placed on bed

## 2020-09-11 NOTE — Discharge Instructions (Signed)
Please be sure to follow-up with your neurologist on this matter.  Wound Care - Dermabond  Your wound has been closed with a medical-grade glue called Dermabond. Please adhere to the following wound care instructions:  You may shower, but avoid submerging the wound. Do not scrub the wound, as this may cause the glue to wear off prematurely.    The glue will wear off on its own, usually within 5-10 days. During this time period DO NOT apply antibiotic ointments or any other ointments or lotions to the area as this can cause the glue to wear off prematurely.  After 10 days, you may apply ointments, such as Neosporin, to the area to help the remaining glue to wear off.   After the wound has healed and the glue is gone, you may apply ointments such as Aquaphor to the wound to reduce scarring.  May use ibuprofen, naproxen, or Tylenol for pain.  Return to the ED should the wound edges come apart or signs of infection arise, such as spreading redness, puffiness/swelling, pus draining from the wound, severe increase in pain, or any other major issues.  For prescription assistance, may try using prescription discount sites or apps, such as goodrx.com

## 2020-09-11 NOTE — ED Provider Notes (Signed)
[facility] DEPT Provider Note   CSN: [id number] Arrival date & time: 09/11/20  1036     History Chief Complaint  Patient presents with  . Seizures    Mi Aadam Cuahutemoc Attar is a 20 y.o. male.  The history is provided by the patient and the EMS personnel.        Mi Jabes Primo is a 20 y.o. male, with a history of seizures, presenting to the ED with seizure that occurred shortly prior to arrival.  Patient states he was sleeping when the seizure occurred.  EMS was called by his brother. His last seizure was several months ago.  States he is usually compliant with his medications.  Patient endorses his last dose of Keppra was the night before last, which means patient has missed 3 doses. EMS reports patient postictal upon their arrival.  No seizure activity during transport.  Denies alcohol or illicit drug use. Denies recent head injury or abnormal headaches prior to his seizure. Denies fever/chills, recent illness, N/V/D, chest pain, shortness of breath, cough, neck/back pain, abdominal pain, incontinence, neurologic deficit, or any other complaints.  Past Medical History:  Diagnosis Date  . Allergy    rhinitis  . Asthma    as a child  . Complication of anesthesia    pt. reports he need more anesthesia with the septoplasty surgery  . Eosinophilic esophagitis    heartburn and reflux  . Family history of adverse reaction to anesthesia    mother respiratory failure  . Nasal fracture    deviated septum  . Pneumonia    1x history of  . Premature baby    2 months early  . Seizures ([place])    well controlled on meds/ one 2 months ago when meds were late    Patient Active Problem List   Diagnosis Date Noted  . Esophagitis determined by endoscopy 07/24/2020  . Labral tear of shoulder, left, subsequent encounter 02/13/2020  . Shoulder dislocation, left, subsequent encounter 02/13/2020  . Seizure disorder ([place]) 12/22/2019  . Migraines 12/22/2019   . Mild intermittent asthma 12/22/2019  . Migraine without aura and without status migrainosus, not intractable 06/29/2019  . 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 Altered mental status 03/12/2014  . Lethargic 03/09/2014  . Environmental allergies 03/01/2014  . Gastroesophageal reflux disease with esophagitis 10/06/2013  . Localization-related (focal) (partial) symptomatic epilepsy and epileptic syndromes with complex partial seizures, not intractable, without status epilepticus ([place]) 10/06/2013  . Spells 07/26/2013  . Allergy to other foods 07/13/2013  . Prophylactic immunotherapy 07/13/2013  . Unspecified asthma, uncomplicated 57/84/6962  . Eosinophilic esophagitis 95/28/4132  . Gastroesophageal reflux 01/23/2013  . Vomiting 01/23/2013  . Leukopenia 10/19/2012  . Muscle jerks during sleep 10/19/2012  . Constipation 08/20/2012  . Neutropenia ([place]) 08/20/2012  . Allergic rhinitis due to pollen 02/19/2011    Past Surgical History:  Procedure Laterality Date  . ADENOIDECTOMY    . CLOSED REDUCTION NASAL FRACTURE N/A 08/31/2017   Procedure: CLOSED REDUCTION NASAL FRACTURE;  Surgeon: Clyde Canterbury, MD;  Location: [facility];  Service: ENT;  Laterality: N/A;  . SEPTOPLASTY N/A 08/31/2017   Procedure: SEPTOPLASTY;  Surgeon: Clyde Canterbury, MD;  Location: [facility];  Service: ENT;  Laterality: N/A;  . SHOULDER ARTHROSCOPY WITH LABRAL REPAIR Left 04/30/2020   Procedure: LEFT SHOULDER POSTERIOR LABRAL REPAIR WITH ARTHROSCOPY, BICEPS TENDON RELEASE AND TENODESIS, OPEN ALLOGRAFT FOR REVERSE BANKART LESION;  Surgeon: [name], MD;  Location: [facility];  Service:  Orthopedics;  Laterality: Left;  . TONSILLECTOMY     and addenoids       Family History  Problem Relation Age of Onset  . Cancer Mother        breat  . Protein S deficiency Mother     Social History   Tobacco Use  . Smoking status: Never Smoker  . Smokeless tobacco: Never Used  Vaping Use  . Vaping Use:  Never used  Substance Use Topics  . Alcohol use: No  . Drug use: No    Home Medications Prior to Admission medications   Medication Sig Start Date End Date Taking? Authorizing Provider  EPINEPHrine 0.3 mg/0.3 mL IJ SOAJ injection Inject 0.3 mg into the muscle as needed for anaphylaxis. 07/10/20  Yes McDonald, Mia A, PA-C  lidocaine (LIDODERM) 5 % Place 1 patch onto the skin daily. Remove & Discard patch within 12 hours or as directed by MD Patient taking differently: Place 1 patch onto the skin daily as needed (pain). Remove & Discard patch within 12 hours or as directed by MD 02/25/20  Yes Henderly, Britni A, PA-C  oxyCODONE (ROXICODONE) 5 MG immediate release tablet Take 1 tablet (5 mg total) by mouth every 6 (six) hours as needed. Patient not taking: Reported on 09/11/2020 04/30/20 04/30/21  Magnant, Gerrianne Scale, PA-C    Allergies    Lortab [hydrocodone-acetaminophen], Other, Zofran [ondansetron hcl], Citrus, Dairy aid [lactase], Influenza virus vaccine, Soy allergy, Tylenol [acetaminophen], Eggs or egg-derived products, and Hydrocodone-acetaminophen  Review of Systems   Review of Systems  Constitutional: Negative for chills, diaphoresis and fever.  Respiratory: Negative for cough and shortness of breath.   Cardiovascular: Negative for chest pain.  Gastrointestinal: Negative for abdominal pain, diarrhea, nausea and vomiting.  Musculoskeletal: Negative for back pain and neck pain.  Neurological: Positive for seizures and headaches. Negative for weakness.  All other systems reviewed and are negative.   Physical Exam Updated Vital Signs BP 136/89 (BP Location: Right Arm)   Pulse 65   Temp 98.5 F (36.9 C) (Oral)   Resp 14   SpO2 100%   Physical Exam Vitals and nursing note reviewed.  Constitutional:      General: He is not in acute distress.    Appearance: He is well-developed. He is not diaphoretic.  HENT:     Head: Normocephalic.     Comments: Superficial appearing laceration  to the left eyelid, approximately 1 cm in length.  Full opening and closing of the eyelid.  No pain or difficulty with EOMs. No periorbital tenderness. A few other abrasions are also noted to the face without underlying tenderness in these areas. No deformity or instability.    Nose: Nose normal.     Comments: No tenderness, swelling, or deformity to the nose.  No septal hematomas noted.    Mouth/Throat:     Mouth: Mucous membranes are moist.     Pharynx: Oropharynx is clear.     Comments: Dentition appears to be intact and stable.  No noted area of intraoral swelling.  No trismus or noted abnormal phonation.  Mouth opening to at least 3 finger widths.  Handles oral secretions without difficulty.  No sublingual swelling or tongue elevation.  No swelling or tenderness to the submental or submandibular regions.  No swelling or tenderness into the soft tissues of the neck. Eyes:     Extraocular Movements: Extraocular movements intact.     Conjunctiva/sclera: Conjunctivae normal.     Pupils: Pupils are equal, round,  and reactive to light.  Cardiovascular:     Rate and Rhythm: Normal rate and regular rhythm.     Pulses: Normal pulses.          Radial pulses are 2+ on the right side and 2+ on the left side.       Posterior tibial pulses are 2+ on the right side and 2+ on the left side.     Heart sounds: Normal heart sounds.     Comments: Tactile temperature in the extremities appropriate and equal bilaterally. Pulmonary:     Effort: Pulmonary effort is normal. No respiratory distress.     Breath sounds: Normal breath sounds.  Abdominal:     Palpations: Abdomen is soft.     Tenderness: There is no abdominal tenderness. There is no guarding.  Musculoskeletal:     Cervical back: Normal range of motion and neck supple. No tenderness.     Right lower leg: No edema.     Left lower leg: No edema.     Comments: Normal motor function intact in all extremities. No midline spinal tenderness.   Skin:     General: Skin is warm and dry.  Neurological:     Mental Status: He is alert and oriented to person, place, and time.     Comments: No noted acute cognitive deficit. Sensation grossly intact to light touch in the extremities.   Grip strengths equal bilaterally.   Strength 5/5 in all extremities.  No gait disturbance.  Coordination intact.  Cranial nerves III-XII grossly intact.  Handles oral secretions without noted difficulty.  No noted phonation or speech deficit. No facial droop.   Psychiatric:        Mood and Affect: Mood and affect normal.        Speech: Speech normal.        Behavior: Behavior normal.           ED Results / Procedures / Treatments   Labs (all labs ordered are listed, but only abnormal results are displayed) Labs Reviewed  BASIC METABOLIC PANEL  CBC WITH DIFFERENTIAL/PLATELET  ETHANOL  RAPID URINE DRUG SCREEN, HOSP PERFORMED    EKG None  Radiology No results found.  Procedures .Marland KitchenLaceration Repair  Date/Time: 09/11/2020 1:17 PM Performed by: Lorayne Bender, PA-C Authorized by: Lorayne Bender, PA-C   Consent:    Consent obtained:  Verbal   Consent given by:  Patient   Risks, benefits, and alternatives were discussed: yes     Risks discussed:  Infection, need for additional repair, pain, poor cosmetic result and poor wound healing Universal protocol:    Patient identity confirmed:  Verbally with patient and provided demographic data Laceration details:    Location:  Face   Face location:  L upper eyelid   Extent:  Superficial   Length (cm):  1 Pre-procedure details:    Preparation:  Patient was prepped and draped in usual sterile fashion Treatment:    Area cleansed with:  Saline   Amount of cleaning:  Standard   Irrigation solution:  Sterile saline   Irrigation method:  Syringe Skin repair:    Repair method:  Tissue adhesive Approximation:    Approximation:  Close Repair type:    Repair type:  Simple Post-procedure details:     Dressing:  Open (no dressing)   Procedure completion:  Tolerated well, no immediate complications     Medications Ordered in ED Medications  bacitracin ointment ( Topical Given 09/11/20 1407)  ibuprofen (ADVIL)  tablet 600 mg (600 mg Oral Given 09/11/20 1120)  Tdap (BOOSTRIX) injection 0.5 mL (0.5 mLs Intramuscular Given 09/11/20 1120)  lactated ringers bolus 1,000 mL (1,000 mLs Intravenous New Bag/Given 09/11/20 1131)  levETIRAcetam (KEPPRA) 2,000 mg in sodium chloride 0.9 % 250 mL IVPB (0 mg Intravenous Stopped 09/11/20 1404)    ED Course  I have reviewed the triage vital signs and the nursing notes.  Pertinent labs & imaging results that were available during my care of the patient were reviewed by me and considered in my medical decision making (see chart for details).    MDM Rules/Calculators/A&P                          Patient presents following a seizure.  Patient had a gap of several doses of his Keppra, which was thought to be the reason for his seizure. Patient observed here in the ED without recurrence of seizure activity. Lab work unremarkable. The patient was given instructions for home care as well as return precautions. Patient voices understanding of these instructions, accepts the plan, and is comfortable with discharge.  I reviewed and interpreted the patient's labs.  Findings and plan of care discussed with attending physician, Bjorn Pippin, MD.    Vitals:   09/11/20 1230 09/11/20 1300 09/11/20 1505 09/11/20 1530  BP: 133/79 118/78 133/72 128/87  Pulse: 66 74 87 74  Resp: 15 11 (!) 23 20  Temp:      TempSrc:      SpO2: 100% 99% 100% 99%     Final Clinical Impression(s) / ED Diagnoses Final diagnoses:  Seizure [facility])    Rx / DC Orders ED Discharge Orders    None       Layla Maw 09/11/20 1541    Varney Biles, MD 09/11/20 1625

## 2020-09-11 NOTE — ED Triage Notes (Signed)
Transported by [facility] from home-- hx of epilepsy and had 2 witnessed seizures today that lasted approximately 1 minute each. +post ictal upon arrival; takes [place] for seizures and pt reports compliance.

## 2020-09-11 NOTE — ED Notes (Signed)
ED Provider at bedside. 

## 2020-09-12 ENCOUNTER — Telehealth: Payer: Self-pay

## 2020-09-12 NOTE — Telephone Encounter (Signed)
Transition Care Management Unsuccessful Follow-up Telephone Call  Date of discharge and from where:  09/11/2020 from [facility]  Attempts:  1st Attempt  Reason for unsuccessful TCM follow-up call:  Left voice message

## 2020-09-13 NOTE — Telephone Encounter (Signed)
Transition Care Management Unsuccessful Follow-up Telephone Call  Date of discharge and from where:  09/11/2020 from [facility]  Attempts:  2nd Attempt  Reason for unsuccessful TCM follow-up call:  Left voice message

## 2020-10-02 ENCOUNTER — Other Ambulatory Visit: Payer: Self-pay

## 2020-10-02 ENCOUNTER — Ambulatory Visit (INDEPENDENT_AMBULATORY_CARE_PROVIDER_SITE_OTHER): Payer: Medicaid Other | Admitting: Orthopedic Surgery

## 2020-10-02 ENCOUNTER — Encounter: Payer: Self-pay | Admitting: Orthopedic Surgery

## 2020-10-02 DIAGNOSIS — S43432D Superior glenoid labrum lesion of left shoulder, subsequent encounter: Secondary | ICD-10-CM

## 2020-10-02 NOTE — Progress Notes (Signed)
Office Visit Note   Patient: Terrence Riley           Date of Birth: 2001/06/20           MRN: [id number] Visit Date: 10/02/2020 Requested by: Eulas Post, MD [place],  [place] [address] PCP: Eulas Post, MD  Subjective: Chief Complaint  Patient presents with  . Left Shoulder - Follow-up    HPI: Patient presents now 5 months out left shoulder posterior labral repair with biceps tendon release and tenodesis and open allograft for reverse Bankart lesion.  He was released to play basketball last clinic visit.  Has been doing that for several weeks and has been doing well.  He does report one episode of right shoulder subluxation when he was playing but has had no further issues on the right-hand side.              ROS: All systems reviewed are negative as they relate to the chief complaint within the history of present illness.  Patient denies  fevers or chills.   Assessment & Plan: Visit Diagnoses:  1. Labral tear of shoulder, left, subsequent encounter     Plan: Impression is left shoulder labral tear with posterior instability which is doing well following surgery.  He has excellent range of motion and strength today with no real apprehension and less than 1+ posterior instability on that side.  Has had 1 pseudosubluxation episode on the right and his shoulder stability feels very good on that side.  I think if it happens again we can work it up with radiographs and MRI scan.  He does not have physical exam evidence of multidirectional instability on the right-hand side.  Follow-up as needed.  Cautioned him against bench press and dumbbell flies and instead coached him on how to work out his chest muscles without putting undue posterior force on the glenohumeral joint.  Follow-Up Instructions: Return if symptoms worsen or fail to improve.   Orders:  No orders of the defined types were placed in this encounter.  No orders of the defined types were  placed in this encounter.     Procedures: No procedures performed   Clinical Data: No additional findings.  Objective: Vital Signs: There were no vitals taken for this visit.  Physical Exam:   Constitutional: Patient appears well-developed HEENT:  Head: Normocephalic Eyes:EOM are normal Neck: Normal range of motion Cardiovascular: Normal rate Pulmonary/chest: Effort normal Neurologic: Patient is alert Skin: Skin is warm Psychiatric: Patient has normal mood and affect    Ortho Exam: Ortho exam demonstrates excellent range of motion of both shoulders.  Rotator cuff strength is good infraspinatus supraspinatus subscap muscle testing.  He has less than 1+ posterior instability and less than a centimeter sulcus sign on the left and right-hand side.  Subscap strength 5+ out of 5 bilaterally.  Lacks about 15 degrees of forward flexion on the left compared to the right.  Isolated glenohumeral abduction is also about slightly less than 10 degrees side to side difference.  Specialty Comments:  No specialty comments available.  Imaging: No results found.   PMFS History: Patient Active Problem List   Diagnosis Date Noted  . Esophagitis determined by endoscopy 07/24/2020  . Labral tear of shoulder, left, subsequent encounter 02/13/2020  . Shoulder dislocation, left, subsequent encounter 02/13/2020  . Seizure disorder ([place]) 12/22/2019  . Migraines 12/22/2019  . Mild intermittent asthma 12/22/2019  . Migraine without aura and without status  migrainosus, not intractable 06/29/2019  . 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 Altered mental status 03/12/2014  . Lethargic 03/09/2014  . Environmental allergies 03/01/2014  . Gastroesophageal reflux disease with esophagitis 10/06/2013  . Localization-related (focal) (partial) symptomatic epilepsy and epileptic syndromes with complex partial seizures, not intractable, without status epilepticus ([place]) 10/06/2013  . Spells 07/26/2013  . Allergy  to other foods 07/13/2013  . Prophylactic immunotherapy 07/13/2013  . Unspecified asthma, uncomplicated 63/89/3734  . Eosinophilic esophagitis 28/76/8115  . Gastroesophageal reflux 01/23/2013  . Vomiting 01/23/2013  . Leukopenia 10/19/2012  . Muscle jerks during sleep 10/19/2012  . Constipation 08/20/2012  . Neutropenia ([place]) 08/20/2012  . Allergic rhinitis due to pollen 02/19/2011   Past Medical History:  Diagnosis Date  . Allergy    rhinitis  . Asthma    as a child  . Complication of anesthesia    pt. reports he need more anesthesia with the septoplasty surgery  . Eosinophilic esophagitis    heartburn and reflux  . Family history of adverse reaction to anesthesia    mother respiratory failure  . Nasal fracture    deviated septum  . Pneumonia    1x history of  . Premature baby    2 months early  . Seizures ([place])    well controlled on meds/ one 2 months ago when meds were late    Family History  Problem Relation Age of Onset  . Cancer Mother        breat  . Protein S deficiency Mother     Past Surgical History:  Procedure Laterality Date  . ADENOIDECTOMY    . CLOSED REDUCTION NASAL FRACTURE N/A 08/31/2017   Procedure: CLOSED REDUCTION NASAL FRACTURE;  Surgeon: Clyde Canterbury, MD;  Location: [facility];  Service: ENT;  Laterality: N/A;  . SEPTOPLASTY N/A 08/31/2017   Procedure: SEPTOPLASTY;  Surgeon: Clyde Canterbury, MD;  Location: [facility];  Service: ENT;  Laterality: N/A;  . SHOULDER ARTHROSCOPY WITH LABRAL REPAIR Left 04/30/2020   Procedure: LEFT SHOULDER POSTERIOR LABRAL REPAIR WITH ARTHROSCOPY, BICEPS TENDON RELEASE AND TENODESIS, OPEN ALLOGRAFT FOR REVERSE BANKART LESION;  Surgeon: [name], MD;  Location: [facility];  Service: Orthopedics;  Laterality: Left;  . TONSILLECTOMY     and addenoids   Social History   Occupational History  . Not on file  Tobacco Use  . Smoking status: Never Smoker  . Smokeless tobacco: Never Used  Vaping Use   . Vaping Use: Never used  Substance and Sexual Activity  . Alcohol use: No  . Drug use: No  . Sexual activity: Not on file

## 2020-10-17 ENCOUNTER — Encounter (HOSPITAL_COMMUNITY): Payer: Self-pay

## 2020-10-17 ENCOUNTER — Emergency Department (HOSPITAL_COMMUNITY)
Admission: EM | Admit: 2020-10-17 | Discharge: 2020-10-17 | Disposition: A | Discharge status: Home / Self Care | Payer: Medicaid Other | Attending: Emergency Medicine | Admitting: Emergency Medicine

## 2020-10-17 DIAGNOSIS — Z5321 Procedure and treatment not carried out due to patient leaving prior to being seen by health care provider: Secondary | ICD-10-CM | POA: Diagnosis not present

## 2020-10-17 DIAGNOSIS — R519 Headache, unspecified: Secondary | ICD-10-CM | POA: Insufficient documentation

## 2020-10-17 DIAGNOSIS — R079 Chest pain, unspecified: Secondary | ICD-10-CM | POA: Diagnosis not present

## 2020-10-17 NOTE — ED Triage Notes (Addendum)
Pt c/o L sided CP x 2 days and HA x 1 day. States it's worse with movement, denies SOB. States he wanted to make sure he wasn't having a heart attack while driving home.

## 2021-01-14 ENCOUNTER — Other Ambulatory Visit: Payer: Self-pay

## 2021-01-14 ENCOUNTER — Emergency Department (HOSPITAL_COMMUNITY)
Admission: EM | Admit: 2021-01-14 | Discharge: 2021-01-15 | Disposition: A | Discharge status: Home / Self Care | Payer: No Typology Code available for payment source | Attending: Emergency Medicine | Admitting: Emergency Medicine

## 2021-01-14 ENCOUNTER — Encounter (HOSPITAL_COMMUNITY): Payer: Self-pay | Admitting: Emergency Medicine

## 2021-01-14 DIAGNOSIS — Z20822 Contact with and (suspected) exposure to covid-19: Secondary | ICD-10-CM | POA: Insufficient documentation

## 2021-01-14 DIAGNOSIS — G40909 Epilepsy, unspecified, not intractable, without status epilepticus: Secondary | ICD-10-CM | POA: Insufficient documentation

## 2021-01-14 DIAGNOSIS — R569 Unspecified convulsions: Secondary | ICD-10-CM

## 2021-01-14 DIAGNOSIS — J4520 Mild intermittent asthma, uncomplicated: Secondary | ICD-10-CM | POA: Insufficient documentation

## 2021-01-14 DIAGNOSIS — R0789 Other chest pain: Secondary | ICD-10-CM | POA: Diagnosis not present

## 2021-01-14 DIAGNOSIS — R42 Dizziness and giddiness: Secondary | ICD-10-CM | POA: Diagnosis present

## 2021-01-14 LAB — CBG MONITORING, ED: Glucose-Capillary: 91 mg/dL (ref 70–99)

## 2021-01-14 MED ORDER — LORAZEPAM 2 MG/ML IJ SOLN
INTRAMUSCULAR | Status: AC
  Administered 2021-01-14: 2 mg via INTRAMUSCULAR
  Filled 2021-01-14: qty 1

## 2021-01-14 MED ORDER — LORAZEPAM 2 MG/ML IJ SOLN: 1.0000 mg | Freq: Once | INTRAMUSCULAR | Status: DC

## 2021-01-14 MED ORDER — LEVETIRACETAM IN NACL 1000 MG/100ML IV SOLN
1000.0000 mg | Freq: Once | INTRAVENOUS | Status: DC
  Filled 2021-01-14: qty 100

## 2021-01-14 MED ORDER — LORAZEPAM 2 MG/ML IJ SOLN
1.0000 mg | Freq: Once | INTRAMUSCULAR | Status: AC
  Administered 2021-01-15: 1 mg via INTRAVENOUS
  Filled 2021-01-14: qty 1

## 2021-01-14 MED ORDER — LEVETIRACETAM IN NACL 1000 MG/100ML IV SOLN
1000.0000 mg | Freq: Once | INTRAVENOUS | Status: AC
  Administered 2021-01-15: 1000 mg via INTRAVENOUS

## 2021-01-14 MED ORDER — LORAZEPAM 2 MG/ML IJ SOLN: 2.0000 mg | Freq: Once | INTRAMUSCULAR | Status: AC

## 2021-01-14 NOTE — ED Triage Notes (Signed)
Patient reports dizziness x3 days that worsens with movement and chest "burning" x2 days. Denies cough and fevers. Denies SOB.

## 2021-01-15 ENCOUNTER — Encounter (HOSPITAL_COMMUNITY): Payer: Self-pay

## 2021-01-15 ENCOUNTER — Emergency Department (HOSPITAL_COMMUNITY): Payer: No Typology Code available for payment source

## 2021-01-15 LAB — BASIC METABOLIC PANEL
Anion gap: 9 (ref 5–15)
BUN: 10 mg/dL (ref 6–20)
CO2: 22 mmol/L (ref 22–32)
Calcium: 9.6 mg/dL (ref 8.9–10.3)
Chloride: 107 mmol/L (ref 98–111)
Creatinine, Ser: 1.27 mg/dL — ABNORMAL HIGH (ref 0.61–1.24)
GFR, Estimated: >60 mL/min (ref >60)
Glucose, Bld: 85 mg/dL (ref 70–99)
Potassium: 3.6 mmol/L (ref 3.5–5.1)
Sodium: 138 mmol/L (ref 135–145)

## 2021-01-15 LAB — URINALYSIS, ROUTINE W REFLEX MICROSCOPIC
Bacteria, UA: NONE SEEN
Bilirubin Urine: NEGATIVE
Glucose, UA: NEGATIVE mg/dL
Ketones, ur: NEGATIVE mg/dL
Leukocytes,Ua: NEGATIVE
Nitrite: NEGATIVE
Protein, ur: NEGATIVE mg/dL
Specific Gravity, Urine: 1.005 (ref 1.005–1.030)
pH: 7 (ref 5.0–8.0)

## 2021-01-15 LAB — ETHANOL: Alcohol, Ethyl (B): <10 mg/dL (ref <10)

## 2021-01-15 LAB — CBC WITH DIFFERENTIAL/PLATELET
Abs Immature Granulocytes: 0.01 10*3/uL (ref 0.00–0.07)
Basophils Absolute: 0.1 10*3/uL (ref 0.0–0.1)
Basophils Relative: 1 %
Eosinophils Absolute: 0.1 10*3/uL (ref 0.0–0.5)
Eosinophils Relative: 2 %
HCT: 44.4 % (ref 39.0–52.0)
Hemoglobin: 15 g/dL (ref 13.0–17.0)
Immature Granulocytes: 0 %
Lymphocytes Relative: 39 %
Lymphs Abs: 2.3 10*3/uL (ref 0.7–4.0)
MCH: 29.2 pg (ref 26.0–34.0)
MCHC: 33.8 g/dL (ref 30.0–36.0)
MCV: 86.4 fL (ref 80.0–100.0)
Monocytes Absolute: 0.4 10*3/uL (ref 0.1–1.0)
Monocytes Relative: 8 %
Neutro Abs: 2.9 10*3/uL (ref 1.7–7.7)
Neutrophils Relative %: 50 %
Platelets: 178 10*3/uL (ref 150–400)
RBC: 5.14 MIL/uL (ref 4.22–5.81)
RDW: 12.6 % (ref 11.5–15.5)
WBC: 5.9 10*3/uL (ref 4.0–10.5)
nRBC: 0 % (ref 0.0–0.2)

## 2021-01-15 LAB — RESP PANEL BY RT-PCR (FLU A&B, COVID) ARPGX2
Influenza A by PCR: NEGATIVE
Influenza B by PCR: NEGATIVE
SARS Coronavirus 2 by RT PCR: NEGATIVE

## 2021-01-15 IMAGING — DX DG CHEST 1V PORT
1 series · 1 of 1 positions shown · non-contrast
Comparison: [DATE]

CLINICAL DATA: Chest pain and dizziness.

EXAM:
PORTABLE CHEST 1 VIEW

[chest ap]
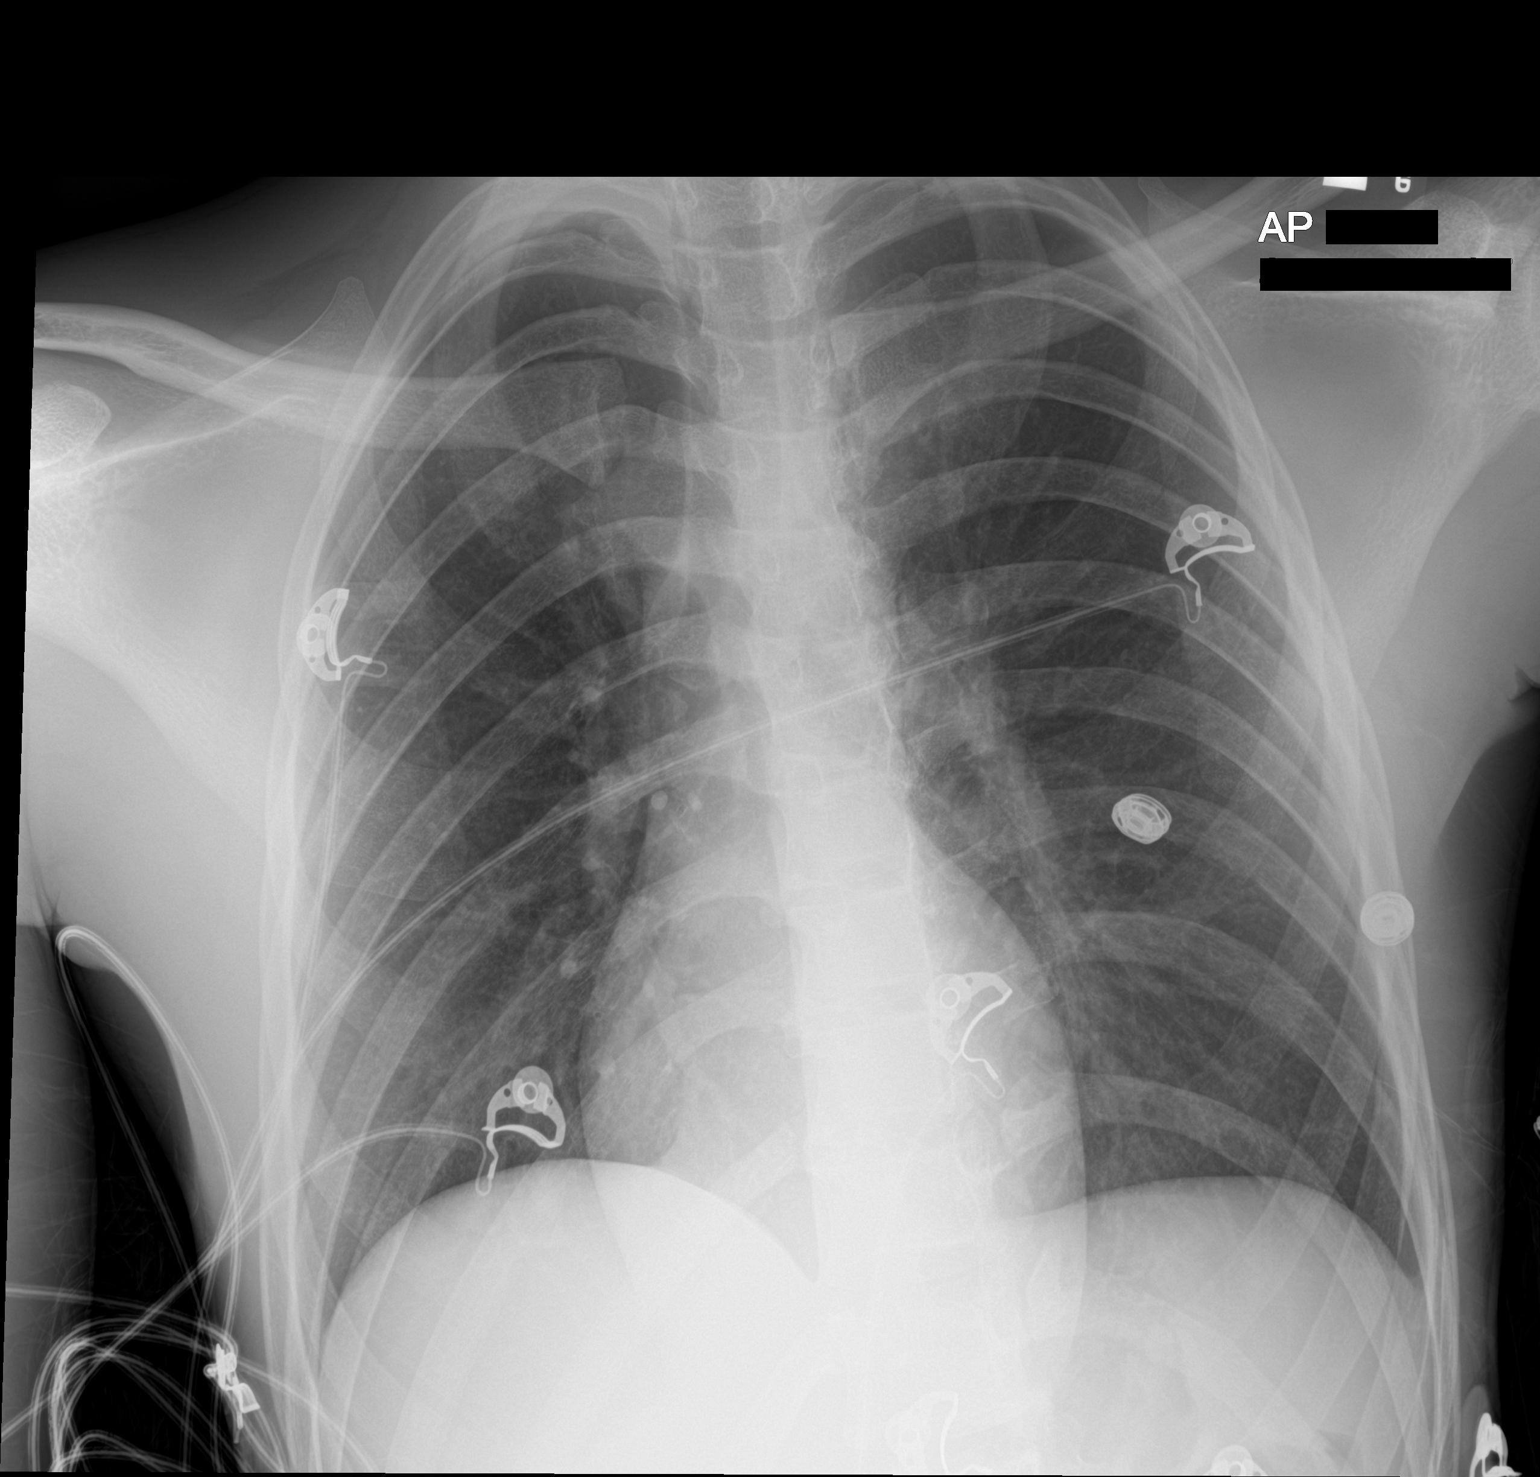

[1 of 1 positions shown; findings below may reference images not displayed]

FINDINGS: The heart size and mediastinal contours are within normal limits.
Both lungs are clear. The visualized skeletal structures are
unremarkable.
IMPRESSION: No active disease.

## 2021-01-15 NOTE — ED Provider Notes (Addendum)
[facility] DEPT Provider Note   CSN: [id number] Arrival date & time: 01/14/21  2215     History Chief Complaint  Patient presents with   Chest Pain   Dizziness    [name] is a 20 y.o. male.  Per RN patient with past medical history notable for seizures presents to the emergency department with chief complaint of dizziness and burning in chest.  When I came to evaluate the patient, patient was having an active seizure.  Uncertain when the seizure started.  Level 5 caveat applies secondary to acuity of condition.  The history is provided by the patient. No language interpreter was used.      Past Medical History:  Diagnosis Date   Allergy    rhinitis   Asthma    as a child   Complication of anesthesia    pt. reports he need more anesthesia with the septoplasty surgery   Eosinophilic esophagitis    heartburn and reflux   Family history of adverse reaction to anesthesia    mother respiratory failure   Nasal fracture    deviated septum   Pneumonia    1x history of   Premature baby    2 months early   Seizures ([place])    well controlled on meds/ one 2 months ago when meds were late    Patient Active Problem List   Diagnosis Date Noted   Esophagitis determined by endoscopy 07/24/2020   Labral tear of shoulder, left, subsequent encounter 02/13/2020   Shoulder dislocation, left, subsequent encounter 02/13/2020   Seizure disorder ([place]) 12/22/2019   Migraines 12/22/2019   Mild intermittent asthma 12/22/2019   Migraine without aura and without status migrainosus, not intractable 06/29/2019   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   Altered mental status 03/12/2014   Lethargic 03/09/2014   Environmental allergies 03/01/2014   Gastroesophageal reflux disease with esophagitis 10/06/2013   Localization-related (focal) (partial) symptomatic epilepsy and epileptic syndromes with complex partial seizures, not intractable, without status  epilepticus ([place]) 10/06/2013   Spells 07/26/2013   Allergy to other foods 07/13/2013   Prophylactic immunotherapy 07/13/2013   Unspecified asthma, uncomplicated 28/31/5176   Eosinophilic esophagitis 16/01/3709   Gastroesophageal reflux 01/23/2013   Vomiting 01/23/2013   Leukopenia 10/19/2012   Muscle jerks during sleep 10/19/2012   Constipation 08/20/2012   Neutropenia ([place]) 08/20/2012   Allergic rhinitis due to pollen 02/19/2011    Past Surgical History:  Procedure Laterality Date   ADENOIDECTOMY     CLOSED REDUCTION NASAL FRACTURE N/A 08/31/2017   Procedure: CLOSED REDUCTION NASAL FRACTURE;  Surgeon: Clyde Canterbury, MD;  Location: [facility];  Service: ENT;  Laterality: N/A;   SEPTOPLASTY N/A 08/31/2017   Procedure: SEPTOPLASTY;  Surgeon: Clyde Canterbury, MD;  Location: [facility];  Service: ENT;  Laterality: N/A;   SHOULDER ARTHROSCOPY WITH LABRAL REPAIR Left 04/30/2020   Procedure: LEFT SHOULDER POSTERIOR LABRAL REPAIR WITH ARTHROSCOPY, BICEPS TENDON RELEASE AND TENODESIS, OPEN ALLOGRAFT FOR REVERSE BANKART LESION;  Surgeon: [name], MD;  Location: [facility];  Service: Orthopedics;  Laterality: Left;   TONSILLECTOMY     and addenoids       Family History  Problem Relation Age of Onset   Cancer Mother        breat   Protein S deficiency Mother     Social History   Tobacco Use   Smoking status: Never   Smokeless tobacco: Never  Vaping Use   Vaping Use: Never used  Substance Use Topics   Alcohol use: No   Drug use: No    Home Medications Prior to Admission medications   Medication Sig Start Date End Date Taking? Authorizing Provider  EPINEPHrine 0.3 mg/0.3 mL IJ SOAJ injection Inject 0.3 mg into the muscle as needed for anaphylaxis. 07/10/20   McDonald, Mia A, PA-C  lidocaine (LIDODERM) 5 % Place 1 patch onto the skin daily. Remove & Discard patch within 12 hours or as directed by MD Patient taking differently: Place 1 patch onto the skin daily  as needed (pain). Remove & Discard patch within 12 hours or as directed by MD 02/25/20   Henderly, Britni A, PA-C  oxyCODONE (ROXICODONE) 5 MG immediate release tablet Take 1 tablet (5 mg total) by mouth every 6 (six) hours as needed. Patient not taking: Reported on 09/11/2020 04/30/20 04/30/21  Magnant, Gerrianne Scale, PA-C    Allergies    Lortab [hydrocodone-acetaminophen], Other, Zofran [name] hcl], Citrus, Dairy aid [lactase], Influenza virus vaccine, Soy allergy, Tylenol [acetaminophen], Eggs or egg-derived products, and Hydrocodone-acetaminophen  Review of Systems   Review of Systems  Unable to perform ROS: Acuity of condition   Physical Exam Updated Vital Signs BP 119/73   Pulse 70   Temp 98.4 F (36.9 C) (Oral)   Resp 14   SpO2 100%   Physical Exam Vitals and nursing note reviewed.  Constitutional:      Appearance: He is well-developed.     Comments: Actively seizing, eye twitching, slight bilateral upper extremity twitching (barely perceptible)  HENT:     Head: Normocephalic and atraumatic.  Eyes:     Conjunctiva/sclera: Conjunctivae normal.     Comments: Twitching eyes, consistent with seizure  Cardiovascular:     Rate and Rhythm: Normal rate and regular rhythm.     Heart sounds: No murmur heard. Pulmonary:     Effort: Pulmonary effort is normal. No respiratory distress.     Breath sounds: Normal breath sounds.  Abdominal:     Palpations: Abdomen is soft.     Tenderness: There is no abdominal tenderness.  Musculoskeletal:     Cervical back: Neck supple.  Skin:    General: Skin is warm and dry.  Neurological:     Comments: Seizing  Psychiatric:     Comments: Unable to assess    ED Results / Procedures / Treatments   Labs (all labs ordered are listed, but only abnormal results are displayed) Labs Reviewed  BASIC METABOLIC PANEL - Abnormal; Notable for the following components:      Result Value   Creatinine, Ser 1.27 (*)    All other components within  normal limits  CBC WITH DIFFERENTIAL/PLATELET  ETHANOL  CBG MONITORING, ED  CBG MONITORING, ED    EKG None  Radiology No results found.  Procedures .Critical Care  Date/Time: 01/15/2021 12:27 AM Performed by: Montine Circle, PA-C Authorized by: Montine Circle, PA-C   Critical care provider statement:    Critical care time (minutes):  66   Critical care was necessary to treat or prevent imminent or life-threatening deterioration of the following conditions:  CNS failure or compromise   Critical care was time spent personally by me on the following activities:  Discussions with consultants, evaluation of patient's response to treatment, examination of patient, ordering and performing treatments and interventions, ordering and review of laboratory studies, ordering and review of radiographic studies, pulse oximetry, re-evaluation of patient's condition, obtaining history from patient or surrogate and review of old charts   Medications  Ordered in ED Medications  LORazepam (ATIVAN) injection 1 mg (has no administration in time range)  LORazepam (ATIVAN) injection 2 mg (2 mg Intramuscular Given 01/14/21 2342)  LORazepam (ATIVAN) injection 1 mg (1 mg Intravenous Given 01/15/21 0002)  levETIRAcetam (KEPPRA) IVPB 1000 mg/100 mL premix (1,000 mg Intravenous New Bag/Given 01/15/21 0000)    ED Course  I have reviewed the triage vital signs and the nursing notes.  Pertinent labs & imaging results that were available during my care of the patient were reviewed by me and considered in my medical decision making (see chart for details).    MDM Rules/Calculators/A&P                          When I went to examine the patient, I found that he was having a seizure.  I notified the charge nurse and the patient was brought back to room 11.  He was placed on the monitors and given 2 mg IV Ativan.  There was no improvement of his seizure.  Dr. Roxanne Mins was called to the bedside, and advised giving  another milligram of Ativan and a loading dose of Keppra.  Chart review shows history of noncompliance with seizure meds.  Patient seizure lasted at least 20 minutes, it is unclear how long he was seizing before I came to evaluate the patient in the triage room.    Differential Dx Seizure, noncompliance  Pertinent Labs I ordered, reviewed, and interpreted labs, which were notable mildly elevated creatinine. Otherwise reassuring.  Imaging Interpretation I ordered imaging studies which included CXR, which was negativ.  Medications I ordered medication ativan and keppra for seizure.    Reassessments After the interventions stated above, I reevaluated the patient and found to be no longer seizing.  His seizure lasted for at least 20 minutes.  Seizure ceased after 3 mg of Ativan and initiation of Keppra.  2:34 AM Patient now alert and oriented.  He does not recall what happened.  He states that he came in tonight because he is having some chest and abdominal pain.  He also reports having subjective fevers at home.  We will add on COVID, chest x-ray, and urinalysis.  4:49 AM Tolerating PO.  Ambulating without difficulty   Plan Discharge with outpatient follow-up.  Patient advised that he cannot drive or operate vehicles until he has been seizure-free for 6 months.  He acknowledges this, but states that he might drive himself home anyway.  [employer] to escort patient home with his vehicle.  Appreciate their service to this patient.   Final Clinical Impression(s) / ED Diagnoses Final diagnoses:  Seizure [facility])    Rx / [facility] Orders ED Discharge Orders     None        Montine Circle, PA-C 01/15/21 0452    Montine Circle, PA-C 77/93/90 3009    Delora Fuel, MD 23/30/07 [id number]    Delora Fuel, MD 33/35/45 [phone]

## 2021-01-15 NOTE — ED Notes (Signed)
Pt was given a cup of water. Pt tolerated water well.

## 2021-01-15 NOTE — Discharge Instructions (Addendum)
Per [place] DMV statutes, patients with seizures are not allowed to drive until  they have been seizure-free for six months. Use caution when using heavy equipment or power tools. Avoid working on ladders or at heights. Take showers instead of baths. Ensure the water temperature is not too high on the home water heater. Do not go swimming alone. When caring for infants or small children, sit down when holding, feeding, or changing them to minimize risk of injury to the child in the event you have a seizure.   Also, Maintain good sleep hygiene. Avoid alcohol.  

## 2021-01-15 NOTE — ED Notes (Signed)
Patient discharged.[employer] is escorting patient to his home.

## 2021-01-15 NOTE — ED Notes (Signed)
Pt ambulated room, pt ambulated fine.

## 2021-01-16 ENCOUNTER — Emergency Department (HOSPITAL_COMMUNITY)
Admission: EM | Admit: 2021-01-16 | Discharge: 2021-01-16 | Disposition: A | Discharge status: Home / Self Care | Payer: No Typology Code available for payment source | Attending: Emergency Medicine | Admitting: Emergency Medicine

## 2021-01-16 ENCOUNTER — Other Ambulatory Visit: Payer: Self-pay

## 2021-01-16 ENCOUNTER — Encounter (HOSPITAL_COMMUNITY): Payer: Self-pay

## 2021-01-16 DIAGNOSIS — J4520 Mild intermittent asthma, uncomplicated: Secondary | ICD-10-CM | POA: Insufficient documentation

## 2021-01-16 DIAGNOSIS — G40909 Epilepsy, unspecified, not intractable, without status epilepticus: Secondary | ICD-10-CM | POA: Diagnosis not present

## 2021-01-16 DIAGNOSIS — R569 Unspecified convulsions: Secondary | ICD-10-CM | POA: Diagnosis present

## 2021-01-16 DIAGNOSIS — Z79899 Other long term (current) drug therapy: Secondary | ICD-10-CM | POA: Diagnosis not present

## 2021-01-16 LAB — CBC WITH DIFFERENTIAL/PLATELET
Abs Immature Granulocytes: 0 10*3/uL (ref 0.00–0.07)
Basophils Absolute: 0.1 10*3/uL (ref 0.0–0.1)
Basophils Relative: 2 %
Eosinophils Absolute: 0 10*3/uL (ref 0.0–0.5)
Eosinophils Relative: 1 %
HCT: 41.9 % (ref 39.0–52.0)
Hemoglobin: 14.3 g/dL (ref 13.0–17.0)
Lymphocytes Relative: 68 %
Lymphs Abs: 1.8 10*3/uL (ref 0.7–4.0)
MCH: 28.9 pg (ref 26.0–34.0)
MCHC: 34.1 g/dL (ref 30.0–36.0)
MCV: 84.8 fL (ref 80.0–100.0)
Monocytes Absolute: 0.1 10*3/uL (ref 0.1–1.0)
Monocytes Relative: 3 %
Neutro Abs: 0.7 10*3/uL — ABNORMAL LOW (ref 1.7–7.7)
Neutrophils Relative %: 26 %
Platelets: 172 10*3/uL (ref 150–400)
RBC: 4.94 MIL/uL (ref 4.22–5.81)
RDW: 12.5 % (ref 11.5–15.5)
WBC: 2.6 10*3/uL — ABNORMAL LOW (ref 4.0–10.5)
nRBC: 0 % (ref 0.0–0.2)
nRBC: 0 /100 WBC

## 2021-01-16 LAB — COMPREHENSIVE METABOLIC PANEL
ALT: 21 U/L (ref 0–44)
AST: 25 U/L (ref 15–41)
Albumin: 4 g/dL (ref 3.5–5.0)
Alkaline Phosphatase: 73 U/L (ref 38–126)
Anion gap: 3 — ABNORMAL LOW (ref 5–15)
BUN: 8 mg/dL (ref 6–20)
CO2: 23 mmol/L (ref 22–32)
Calcium: 9.1 mg/dL (ref 8.9–10.3)
Chloride: 111 mmol/L (ref 98–111)
Creatinine, Ser: 1.21 mg/dL (ref 0.61–1.24)
GFR, Estimated: >60 mL/min (ref >60)
Glucose, Bld: 84 mg/dL (ref 70–99)
Potassium: 3.5 mmol/L (ref 3.5–5.1)
Sodium: 137 mmol/L (ref 135–145)
Total Bilirubin: 1 mg/dL (ref 0.3–1.2)
Total Protein: 6.4 g/dL — ABNORMAL LOW (ref 6.5–8.1)

## 2021-01-16 LAB — MAGNESIUM: Magnesium: 1.9 mg/dL (ref 1.7–2.4)

## 2021-01-16 MED ORDER — LEVETIRACETAM IN NACL 1000 MG/100ML IV SOLN
1000.0000 mg | Freq: Once | INTRAVENOUS | Status: AC
  Administered 2021-01-16: 1000 mg via INTRAVENOUS
  Filled 2021-01-16: qty 100

## 2021-01-16 MED ORDER — LEVETIRACETAM 1000 MG PO TABS: 1000.0000 mg | ORAL_TABLET | Freq: Two times a day (BID) | ORAL | 0 refills | Status: DC

## 2021-01-16 MED ORDER — TOPIRAMATE 200 MG PO TABS: 200.0000 mg | ORAL_TABLET | Freq: Two times a day (BID) | ORAL | 0 refills | Status: DC

## 2021-01-16 NOTE — Discharge Instructions (Addendum)
You were seen today for seizures in the emergency department.  Your work-up was reassuring, and I have discharged you with 30 days of outpatient medicine   Topamax and Keppra. Unfortunately, I can not prescribe the Klonapin. Please call your neurologist as soon as possible to establish care. If that does not work out, I've also listed an alternative practice you can follow up with above. Please return back to the ED if things worsen or fail to improve.  Marland Kitchen

## 2021-01-16 NOTE — ED Provider Notes (Addendum)
[facility] EMERGENCY DEPARTMENT Provider Note   CSN: [id number] Arrival date & time: 01/16/21  1525     History Chief Complaint  Patient presents with   Seizures    [facility] is a 20 y.o. male.  HPI  Patient presents with witnessed seizure.  States he has been taking his medicine, but he was seen at [facility]/14/2021 for seizures.  Did not hit his head or bite his tongue.  He denies any alcohol or drug use.  States he thinks this is due to it being too hot out, states he used to have seizures due to increased temperature outside.   Patient's mother states may switch from brand medicine to generic medicine earlier this year.  She suspects that the change in medicine is the reason for the increase in seizures over the past year.  She now has better insurance, and states she is trying to establish with an adult neurologist for her son.    Past Medical History:  Diagnosis Date   Allergy    rhinitis   Asthma    as a child   Complication of anesthesia    pt. reports he need more anesthesia with the septoplasty surgery   Eosinophilic esophagitis    heartburn and reflux   Family history of adverse reaction to anesthesia    mother respiratory failure   Nasal fracture    deviated septum   Pneumonia    1x history of   Premature baby    2 months early   Seizures ([place])    well controlled on meds/ one 2 months ago when meds were late    Patient Active Problem List   Diagnosis Date Noted   Esophagitis determined by endoscopy 07/24/2020   Labral tear of shoulder, left, subsequent encounter 02/13/2020   Shoulder dislocation, left, subsequent encounter 02/13/2020   Seizure disorder ([place]) 12/22/2019   Migraines 12/22/2019   Mild intermittent asthma 12/22/2019   Migraine without aura and without status migrainosus, not intractable 06/29/2019   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   Altered mental status 03/12/2014   Lethargic 03/09/2014   Environmental  allergies 03/01/2014   Gastroesophageal reflux disease with esophagitis 10/06/2013   Localization-related (focal) (partial) symptomatic epilepsy and epileptic syndromes with complex partial seizures, not intractable, without status epilepticus ([place]) 10/06/2013   Spells 07/26/2013   Allergy to other foods 07/13/2013   Prophylactic immunotherapy 07/13/2013   Unspecified asthma, uncomplicated 95/28/4132   Eosinophilic esophagitis 44/07/270   Gastroesophageal reflux 01/23/2013   Vomiting 01/23/2013   Leukopenia 10/19/2012   Muscle jerks during sleep 10/19/2012   Constipation 08/20/2012   Neutropenia ([place]) 08/20/2012   Allergic rhinitis due to pollen 02/19/2011    Past Surgical History:  Procedure Laterality Date   ADENOIDECTOMY     CLOSED REDUCTION NASAL FRACTURE N/A 08/31/2017   Procedure: CLOSED REDUCTION NASAL FRACTURE;  Surgeon: Clyde Canterbury, MD;  Location: [facility];  Service: ENT;  Laterality: N/A;   SEPTOPLASTY N/A 08/31/2017   Procedure: SEPTOPLASTY;  Surgeon: Clyde Canterbury, MD;  Location: [facility];  Service: ENT;  Laterality: N/A;   SHOULDER ARTHROSCOPY WITH LABRAL REPAIR Left 04/30/2020   Procedure: LEFT SHOULDER POSTERIOR LABRAL REPAIR WITH ARTHROSCOPY, BICEPS TENDON RELEASE AND TENODESIS, OPEN ALLOGRAFT FOR REVERSE BANKART LESION;  Surgeon: [name], MD;  Location: [facility];  Service: Orthopedics;  Laterality: Left;   TONSILLECTOMY     and addenoids       Family History  Problem Relation Age  of Onset   Cancer Mother        breat   Protein S deficiency Mother     Social History   Tobacco Use   Smoking status: Never   Smokeless tobacco: Never  Vaping Use   Vaping Use: Never used  Substance Use Topics   Alcohol use: No   Drug use: No    Home Medications Prior to Admission medications   Medication Sig Start Date End Date Taking? Authorizing Provider  clonazePAM (KLONOPIN) 1 MG tablet Take 1 tablet by mouth 2 (two) times daily.  09/17/20   [provider]  EPINEPHrine 0.3 mg/0.3 mL IJ SOAJ injection Inject 0.3 mg into the muscle as needed for anaphylaxis. 07/10/20   McDonald, Mia A, PA-C  levETIRAcetam (KEPPRA) 1000 MG tablet Take 1,000 mg by mouth 2 (two) times daily. 12/04/20   [provider]  lidocaine (LIDODERM) 5 % Place 1 patch onto the skin daily. Remove & Discard patch within 12 hours or as directed by MD Patient taking differently: Place 1 patch onto the skin daily as needed (pain). Remove & Discard patch within 12 hours or as directed by MD 02/25/20   Henderly, Britni A, PA-C  oxyCODONE (ROXICODONE) 5 MG immediate release tablet Take 1 tablet (5 mg total) by mouth every 6 (six) hours as needed. 04/30/20 04/30/21  Magnant, Charles L, PA-C  topiramate (TOPAMAX) 200 MG tablet Take 200 mg by mouth 2 (two) times daily. 12/04/20   [provider]    Allergies    Lortab [hydrocodone-acetaminophen], Other, Zofran [ondansetron hcl], Citrus, Dairy aid [lactase], Influenza virus vaccine, Soy allergy, Tylenol [acetaminophen], Eggs or egg-derived products, and Hydrocodone-acetaminophen  Review of Systems   Review of Systems  Constitutional:  Negative for fatigue and fever.  Neurological:  Positive for seizures. Negative for weakness and headaches.   Physical Exam Updated Vital Signs BP 100/62 (BP Location: Right Arm)   Pulse 62   Temp 98.6 F (37 C) (Oral)   Resp 15   Ht 6' (1.829 m)   Wt 59 kg   SpO2 98%   BMI 17.64 kg/m   Physical Exam Vitals and nursing note reviewed. Exam conducted with a chaperone present.  Constitutional:      General: He is not in acute distress.    Appearance: Normal appearance.  HENT:     Head: Normocephalic and atraumatic.  Eyes:     General: No scleral icterus.    Extraocular Movements: Extraocular movements intact.     Pupils: Pupils are equal, round, and reactive to light.  Skin:    Coloration: Skin is not jaundiced.  Neurological:     Mental Status:  He is alert. Mental status is at baseline.     Coordination: Coordination normal.    ED Results / Procedures / Treatments   Labs (all labs ordered are listed, but only abnormal results are displayed) Labs Reviewed  COMPREHENSIVE METABOLIC PANEL  CBC WITH DIFFERENTIAL/PLATELET  RAPID URINE DRUG SCREEN, HOSP PERFORMED  MAGNESIUM    EKG None  Radiology DG Chest Port 1 View  Result Date: 01/15/2021 CLINICAL DATA:  Chest pain and dizziness. EXAM: PORTABLE CHEST 1 VIEW COMPARISON:  07/10/2020 FINDINGS: The heart size and mediastinal contours are within normal limits. Both lungs are clear. The visualized skeletal structures are unremarkable. IMPRESSION: No active disease. Electronically Signed   By: Kerby Moors M.D.   On: 01/15/2021 03:54    Procedures Procedures   Medications Ordered in ED Medications - No  data to display  ED Course  I have reviewed the triage vital signs and the nursing notes.  Pertinent labs & imaging results that were available during my care of the patient were reviewed by me and considered in my medical decision making (see chart for details).    MDM Rules/Calculators/A&P                          Patient presents for seizure.  Has history of seizures, currently on Keppra. Vitals are stable, patient appears confused and tired.   Patient was seen 01/14/21 and had seizure while in the department. They suspected medical noncompliance vs. seizures. He was seen 10/17/20 and 09/11/20 and 08/09/20 for similar reasons.   Some questionable noncompliance versus generic medicine versus alternative cause.  I suspect this is most likely due to medical noncompliance.  Am ordering Keppra for the patient while in the ED.  Continue to monitor him.  Basic lab work ordered.  Re-eval: Patient is resting comfortable. He reports feeling better, but states he is still tired. His mother is with him.   Lab work is reassuring.  I suspect his seizure today was due to his history of  epilepsy and running out of his medications.  I've written him a month worth of Topamax and keppra.  I also have given him a referral for neurology.  Patient assures me he will call tomorrow. Appropriate for discharge.    Final Clinical Impression(s) / ED Diagnoses Final diagnoses:  None    Rx / DC Orders ED Discharge Orders     None        Sherrill Raring, PA-C 01/16/21 [place], Karli Wickizer, PA-C 01/16/21 1751    Quintella Reichert, MD 01/17/21 [phone]

## 2021-01-16 NOTE — ED Triage Notes (Addendum)
Pt arrived via [facility]. Pt was at mall w/friends and had a full body seizure that last 2 min. Pt's friends lowered him to ground, so he didn't fall. Per EMS pt was post tictal on scene. Pt is now A&)x4.

## 2021-01-22 ENCOUNTER — Ambulatory Visit (INDEPENDENT_AMBULATORY_CARE_PROVIDER_SITE_OTHER): Payer: No Typology Code available for payment source | Admitting: Family Medicine

## 2021-01-22 ENCOUNTER — Encounter: Payer: Self-pay | Admitting: Neurology

## 2021-01-22 ENCOUNTER — Other Ambulatory Visit: Payer: Self-pay

## 2021-01-22 VITALS — BP 120/70 | HR 53 | Temp 98.1°F | Wt 126.3 lb

## 2021-01-22 DIAGNOSIS — G40909 Epilepsy, unspecified, not intractable, without status epilepticus: Secondary | ICD-10-CM

## 2021-01-22 NOTE — Progress Notes (Signed)
Established Patient Office Visit  Subjective:  Patient ID: Terrence Riley, male    DOB: May 23, 2001  Age: 20 y.o. MRN: [id number]  CC:  Chief Complaint  Patient presents with   Follow-up    HPI [name] presents for ER follow-up.  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  He takes Topamax and Keppra.  He states he has been compliant with medications generally.  He had grand mal seizures June 21 and then again on the 23rd.  Not sure regarding any triggers.  He had extensive labs which showed no major abnormalities.  He states he has had about 3-4 seizures over the past year.  Has previously seen neurologist in [place] but would like to establish with neurologist in [place].Marland Kitchen  He has not seen his neurologist recently.  No recent cerebral imaging.  No headaches.  No recent head injury.  Denies any illicit drug use.  No regular alcohol use.  Past Medical History:  Diagnosis Date   Allergy    rhinitis   Asthma    as a child   Complication of anesthesia    pt. reports he need more anesthesia with the septoplasty surgery   Eosinophilic esophagitis    heartburn and reflux   Family history of adverse reaction to anesthesia    mother respiratory failure   Nasal fracture    deviated septum   Pneumonia    1x history of   Premature baby    2 months early   Seizures ([place])    well controlled on meds/ one 2 months ago when meds were late    Past Surgical History:  Procedure Laterality Date   ADENOIDECTOMY     CLOSED REDUCTION NASAL FRACTURE N/A 08/31/2017   Procedure: CLOSED REDUCTION NASAL FRACTURE;  Surgeon: Clyde Canterbury, MD;  Location: [facility];  Service: ENT;  Laterality: N/A;   SEPTOPLASTY N/A 08/31/2017   Procedure: SEPTOPLASTY;  Surgeon: Clyde Canterbury, MD;  Location: [facility];  Service: ENT;  Laterality: N/A;   SHOULDER ARTHROSCOPY WITH LABRAL REPAIR Left 04/30/2020   Procedure: LEFT SHOULDER POSTERIOR LABRAL  REPAIR WITH ARTHROSCOPY, BICEPS TENDON RELEASE AND TENODESIS, OPEN ALLOGRAFT FOR REVERSE BANKART LESION;  Surgeon: [name], MD;  Location: [facility];  Service: Orthopedics;  Laterality: Left;   TONSILLECTOMY     and addenoids    Family History  Problem Relation Age of Onset   Cancer Mother        breat   Protein S deficiency Mother     Social History   Socioeconomic History   Marital status: Single    Spouse name: Not on file   Number of children: Not on file   Years of education: Not on file   Highest education level: Not on file  Occupational History   Not on file  Tobacco Use   Smoking status: Never   Smokeless tobacco: Never  Vaping Use   Vaping Use: Never used  Substance and Sexual Activity   Alcohol use: No   Drug use: No   Sexual activity: Not on file  Other Topics Concern   Not on file  Social History Narrative   Not on file   Social Determinants of Health   Financial Resource Strain: Not on file  Food Insecurity: Not on file  Transportation Needs: Not on file  Physical Activity: Not on file  Stress: Not on file  Social Connections: Not on file  Intimate Partner Violence: Not on file    Outpatient Medications Prior to Visit  Medication Sig Dispense Refill   clonazePAM (KLONOPIN) 1 MG tablet Take 1 tablet by mouth 2 (two) times daily.     EPINEPHrine 0.3 mg/0.3 mL IJ SOAJ injection Inject 0.3 mg into the muscle as needed for anaphylaxis. 1 each 0   levETIRAcetam (KEPPRA) 1000 MG tablet Take 1 tablet (1,000 mg total) by mouth 2 (two) times daily. 60 tablet 0   lidocaine (LIDODERM) 5 % Place 1 patch onto the skin daily. Remove & Discard patch within 12 hours or as directed by MD (Patient taking differently: Place 1 patch onto the skin daily as needed (pain). Remove & Discard patch within 12 hours or as directed by MD) 30 patch 0   topiramate (TOPAMAX) 200 MG tablet Take 1 tablet (200 mg total) by mouth 2 (two) times daily. 60 tablet 0   oxyCODONE  (ROXICODONE) 5 MG immediate release tablet Take 1 tablet (5 mg total) by mouth every 6 (six) hours as needed. 30 tablet 0   No facility-administered medications prior to visit.    Allergies  Allergen Reactions   Lortab [Hydrocodone-Acetaminophen] Hives   Other Anaphylaxis    Peanuts   Zofran [Ondansetron Hcl] Nausea And Vomiting   Citrus Other (See Comments)    Sores in mouth   Dairy Aid [Lactase] Nausea And Vomiting   Influenza Virus Vaccine Other (See Comments)   Soy Allergy Other (See Comments)    Seizures    Tylenol [Acetaminophen]     Can cause a fever and seizure activity    Eggs Or Egg-Derived Products Rash    Pt mother reports pt only avoids eggs alone but can tolerate as an ingredient. [facility] 07/27/13   Hydrocodone-Acetaminophen Rash    ROS Review of Systems  Constitutional:  Negative for fatigue, fever and unexpected weight change.  Eyes:  Negative for visual disturbance.  Respiratory:  Negative for cough, chest tightness and shortness of breath.   Cardiovascular:  Negative for chest pain, palpitations and leg swelling.  Neurological:  Negative for dizziness, syncope, weakness, light-headedness and headaches.     Objective:    Physical Exam Constitutional:      Appearance: He is well-developed.  HENT:     Right Ear: External ear normal.     Left Ear: External ear normal.  Eyes:     Pupils: Pupils are equal, round, and reactive to light.  Neck:     Thyroid: No thyromegaly.  Cardiovascular:     Rate and Rhythm: Normal rate and regular rhythm.  Pulmonary:     Effort: Pulmonary effort is normal. No respiratory distress.     Breath sounds: Normal breath sounds. No wheezing or rales.  Musculoskeletal:     Cervical back: Neck supple.  Neurological:     General: No focal deficit present.     Mental Status: He is alert and oriented to person, place, and time.     Cranial Nerves: No cranial nerve deficit.     Motor: No weakness.     Gait: Gait normal.    BP  120/70 (BP Location: Left Arm, Patient Position: Sitting, Cuff Size: Normal)   Pulse (!) 53   Temp 98.1 F (36.7 C) (Oral)   Wt 126 lb 4.8 oz (57.3 kg)   SpO2 98%   BMI 17.13 kg/m  Wt Readings from Last 3 Encounters:  01/22/21 126 lb 4.8 oz (57.3 kg)  01/16/21 130 lb 1.1 oz (59  kg)  10/17/20 130 lb (59 kg)     Health Maintenance Due  Topic Date Due   COVID-19 Vaccine (2 - Booster for Janssen series) 02/25/2020    There are no preventive care reminders to display for this patient.  Lab Results  Component Value Date   TSH 1.08 03/12/2020   Lab Results  Component Value Date   WBC 2.6 (L) 01/16/2021   HGB 14.3 01/16/2021   HCT 41.9 01/16/2021   MCV 84.8 01/16/2021   PLT 172 01/16/2021   Lab Results  Component Value Date   NA 137 01/16/2021   K 3.5 01/16/2021   CO2 23 01/16/2021   GLUCOSE 84 01/16/2021   BUN 8 01/16/2021   CREATININE 1.21 01/16/2021   BILITOT 1.0 01/16/2021   ALKPHOS 73 01/16/2021   AST 25 01/16/2021   ALT 21 01/16/2021   PROT 6.4 (L) 01/16/2021   ALBUMIN 4.0 01/16/2021   CALCIUM 9.1 01/16/2021   ANIONGAP 3 (L) 01/16/2021   Lab Results  Component Value Date   CHOL 125 03/12/2020   Lab Results  Component Value Date   HDL 49 03/12/2020   Lab Results  Component Value Date   LDLCALC 63 03/12/2020   Lab Results  Component Value Date   TRIG 51 03/12/2020   Lab Results  Component Value Date   CHOLHDL 2.6 03/12/2020   No results found for: HGBA1C    Assessment & Plan:   Problem List Items Addressed This Visit       Unprioritized   Seizure disorder [facility]) - Primary   Relevant Orders   Ambulatory referral to Neurology   History of grand mal seizures- reportedly since < 55 years of age..  Patient has had a couple of recent seizures as above.  He is on Topamax and Keppra and states he is compliant with therapy.  Also takes Klonopin 1 mg twice daily.  Patient requesting referral to local neurologist and we will set up. -We have advised  no driving until cleared by neurology -Multiple recent labs from ER reviewed.    Follow-up: No follow-ups on file.    Carolann Littler, MD

## 2021-01-28 ENCOUNTER — Other Ambulatory Visit: Payer: Self-pay

## 2021-01-28 ENCOUNTER — Ambulatory Visit (HOSPITAL_COMMUNITY)
Admission: EM | Admit: 2021-01-28 | Discharge: 2021-01-28 | Disposition: A | Discharge status: Home / Self Care | Payer: No Typology Code available for payment source | Attending: Psychiatry | Admitting: Psychiatry

## 2021-01-28 DIAGNOSIS — F430 Acute stress reaction: Secondary | ICD-10-CM

## 2021-01-28 DIAGNOSIS — F4321 Adjustment disorder with depressed mood: Secondary | ICD-10-CM | POA: Diagnosis not present

## 2021-01-28 DIAGNOSIS — F410 Panic disorder [episodic paroxysmal anxiety] without agoraphobia: Secondary | ICD-10-CM | POA: Diagnosis not present

## 2021-01-28 MED ORDER — HYDROXYZINE HCL 25 MG PO TABS: 25.0000 mg | ORAL_TABLET | Freq: Three times a day (TID) | ORAL | 0 refills | Status: AC | PRN

## 2021-01-28 MED ORDER — HYDROXYZINE HCL 25 MG PO TABS
25.0000 mg | ORAL_TABLET | Freq: Three times a day (TID) | ORAL | Status: DC | PRN
  Filled 2021-01-28: qty 20

## 2021-01-28 NOTE — BH Assessment (Addendum)
Comprehensive Clinical Assessment (CCA) Note  01/28/2021 Terrence Riley [id number]  Disposition: Per Rosezetta Schlatter, MD patient does not meet inpatient criteria. Outpatient treatment is recommended. Patient, per mother, will likely f/u with [place] mental health providers, as he is already established with their clinic for primary care.  The patient demonstrates the following risk factors for suicide: Chronic risk factors for suicide include: psychiatric disorder of untreaed anxiety and depression and demographic factors (male, >49 y/o). Acute risk factors for suicide include: loss (financial, interpersonal, professional). Protective factors for this patient include: positive social support, responsibility to others (children, family), hope for the future, and life satisfaction. Considering these factors, the overall suicide risk at this point appears to be low. Patient is appropriate for outpatient follow up.  Patient is a 20 year old male with a history of untreated anxiety and depression who presents voluntarily via [facility] to [facility] for assessment.  Patient's mother reminded patient that today is the anniversary of his grandfather's death. Patient began to experience acute anxiety, panting, tremors, and shortness of breath.  He was able to drive his mother to work at [employer] and she called to have [employer] bring patient here for assessment. Patient struggled to answer triage questions.  Upon assessment, he continues to present with significant anxiety, is tearful and holding head in his hands.  This was his presentation throughout assessment with [facility] and provider.  He was able to answer most questions.  He denies SI, HI and SA hx.  He admits to [facility], stating he has heard a male voice for years that speaks to him.  He denies that the voices are command type and denies any distress related to [facility].  He denies any psychiatric treatment history, stating tries to "be strong and push through  on my own."  He is open to speaking to a therapist at this point.   Patient gives verbal consent for LPC to contact his mother, Olivia Mackie.  She shares that she was worried about patient this morning.  She confirms his presentation was in response to learning that today is the anniversary of his grandfather's death. She shares that patient works and takes classes at [employer] and "he works hard."  She denies safety concerns, stating she just wanted him evaluated due the level of anxiety he was experiencing this morning.  Upon learning he has improved, she expressed appreciation for staff working with her son. Patient's mother states patient will likely follow up with [facility], as they have therapists within this primary care clinic.    Chief Complaint:  Chief Complaint  Patient presents with   urgent emergent eval   Flowsheet Row ED from 01/28/2021 in [facility]  Thoughts that you would be better off dead, or of hurting yourself in some way Several days  PHQ-9 Total Score 10      [facility] from [facility]/28/2021 in [facility] from 01/16/2021 in [facility] ED from 01/14/2021 in [facility] DEPT  C-SSRS RISK CATEGORY No Risk No Risk No Risk       Visit Diagnosis: Untreated anxiety/depression    CCA Screening, Triage and Referral (STR)  Patient Reported Information How did you hear about [place]? Legal System  What Is the Reason for Your Visit/Call Today? UTA- GPD states anniversary of father's death  How Long Has This Been Causing You Problems? <Week  What Do You Feel Would Help You the  Most Today? Treatment for Depression or other mood problem (UTA)   Have You Recently Had Any Thoughts About Hurting Yourself? Yes  Are You Planning to Commit Suicide/Harm Yourself At This time? No (UTA)   Have you Recently Had Thoughts About [place]? No  Are You Planning to  Harm Someone at This Time? No  Explanation: No data recorded  Have You Used Any Alcohol or Drugs in the Past 24 Hours? No  How Long Ago Did You Use Drugs or Alcohol? No data recorded What Did You Use and How Much? No data recorded  Do You Currently Have a Therapist/Psychiatrist? No  Name of Therapist/Psychiatrist: No data recorded  Have You Been Recently Discharged From Any Office Practice or Programs? No  Explanation of Discharge From Practice/Program: No data recorded    CCA Screening Triage Referral Assessment Type of Contact: Face-to-Face  Telemedicine Service Delivery:   Is this Initial or Reassessment? No data recorded Date Telepsych consult ordered in CHL:  No data recorded Time Telepsych consult ordered in CHL:  No data recorded Location of Assessment: [facility] Assessment Services  Provider Location: [facility] Assessment Services   Collateral Involvement: Collateral provided by mother.   Does Patient Have a Stage manager Guardian? No data recorded Name and Contact of Legal Guardian: No data recorded If Minor and Not Living with Parent(s), Who has Custody? No data recorded Is CPS involved or ever been involved? Never  Is APS involved or ever been involved? Never   Patient Determined To Be At Risk for Harm To Self or Others Based on Review of Patient Reported Information or Presenting Complaint? No  Method: No data recorded Availability of Means: No data recorded Intent: No data recorded Notification Required: No data recorded Additional Information for Danger to Others Potential: No data recorded Additional Comments for Danger to Others Potential: No data recorded Are There Guns or Other Weapons in Your Home? No data recorded Types of Guns/Weapons: No data recorded Are These Weapons Safely Secured?                            No data recorded Who Could Verify You Are Able To Have These Secured: No data recorded Do You Have any Outstanding Charges, Pending  Court Dates, Parole/Probation? No data recorded Contacted To Inform of Risk of Harm To Self or Others: Family/Significant Other:   Does Patient Present under Involuntary Commitment? No  IVC Papers Initial File Date: No data recorded  [place] of Residence: [place]   Patient Currently Receiving the Following Services: Not Receiving Services   Determination of Need: Emergent (2 hours)   Options For Referral: Outpatient Therapy; Medication Management   CCA Biopsychosocial Patient Reported Schizophrenia/Schizoaffective Diagnosis in Past: No   Strengths: Some insight, has support   Mental Health Symptoms Depression:   Hopelessness; Tearfulness   Duration of Depressive symptoms:  Duration of Depressive Symptoms: Less than two weeks   Mania:   None   Anxiety:    Worrying; Tension; Sleep; Restlessness; Difficulty concentrating   Psychosis:   Hallucinations   Duration of Psychotic symptoms:  Duration of Psychotic Symptoms: Greater than six months   Trauma:   None   Obsessions:   None   Compulsions:   None   Inattention:   None   Hyperactivity/Impulsivity:   N/A   Oppositional/Defiant Behaviors:   N/A   Emotional Irregularity:  No data recorded  Other Mood/Personality Symptoms:  occasional acute anxiety episodes    Mental Status Exam Appearance and self-care  Stature:   Average   Weight:   Thin   Clothing:   Neat/clean   Grooming:   Normal   Cosmetic use:   None   Posture/gait:   Tense   Motor activity:   Restless; Tremor   Sensorium  Attention:   Normal   Concentration:   Anxiety interferes   Orientation:   Object; Person; Place; Situation; Time; X5   Recall/memory:   Normal   Affect and Mood  Affect:   Restricted; Tearful   Mood:   Anxious   Relating  Eye contact:   Avoided   Facial expression:   Anxious; Constricted; Sad   Attitude toward examiner:   Cooperative   Thought and Language  Speech flow:   Soft; Paucity   Thought content:   Appropriate to Mood and Circumstances   Preoccupation:   None   Hallucinations:   Auditory (reports AH - male voice - non-command type and denies distress)   Organization:  No data recorded  [employer] of Knowledge:   Fair   Intelligence:   Average   Abstraction:   Normal   Judgement:   Fair   Art therapist:   Adequate   Insight:   Fair   Decision Making:   Normal   Social Functioning  Social Maturity:   Responsible   Social Judgement:   Normal   Stress  Stressors:   Grief/losses   Coping Ability:   Programme researcher, broadcasting/film/video Deficits:   Self-care; Self-control   Supports:   Family; Friends/Service system     Religion: Religion/Spirituality Are You A Religious Person?: No  Leisure/Recreation: Leisure / Recreation Do You Have Hobbies?: Yes Leisure and Hobbies: basketball  Exercise/Diet: Exercise/Diet Do You Exercise?: No Have You Gained or Lost A Significant Amount of Weight in the Past Six Months?: No Do You Follow a Special Diet?: No Do You Have Any Trouble Sleeping?: Yes Explanation of Sleeping Difficulties: Patient reports he hasn't sleptin 3 days   CCA Employment/Education Employment/Work Situation: Employment / Work Situation Employment Situation: Employed Work Stressors: Missing work due to struggling to manage anxiety. Patient's Job has Been Impacted by Current Illness: Yes Describe how Patient's Job has Been Impacted: Unable to go to work recently - missed several days Has Patient ever Been in the [employer]?: No  Education: Education Is Patient Currently Attending School?: Yes School Currently Attending: [employer] Last Grade Completed: 12 Did You Attend College?: Yes What Type of College Degree Do you Have?: Currently taking classes at [employer] Did You Have An Individualized Education Program (IIEP): No Did You Have Any Difficulty At School?: No Patient's Education Has Been  Impacted by Current Illness: No   CCA Family/Childhood History Family and Relationship History: Family history Marital status: Single Does patient have children?: No  Childhood History:  Childhood History By whom was/is the patient raised?: Mother Did patient suffer any verbal/emotional/physical/sexual abuse as a child?: No Did patient suffer from severe childhood neglect?: No Has patient ever been sexually abused/assaulted/raped as an adolescent or adult?: No Was the patient ever a victim of a crime or a disaster?: No Witnessed domestic violence?: No Has patient been affected by domestic violence as an adult?: No  Child/Adolescent Assessment:    CCA Substance Use Alcohol/Drug Use: Alcohol / Drug Use Pain Medications: See MAR Prescriptions: See MAR Over the Counter: See MAR History of alcohol / drug use?: No history of  alcohol / drug abuse    ASAM's:  Six Dimensions of Multidimensional Assessment  Dimension 1:  Acute Intoxication and/or Withdrawal Potential:      Dimension 2:  Biomedical Conditions and Complications:      Dimension 3:  Emotional, Behavioral, or Cognitive Conditions and Complications:     Dimension 4:  Readiness to Change:     Dimension 5:  Relapse, Continued use, or Continued Problem Potential:     Dimension 6:  Recovery/Living Environment:     ASAM Severity Score:    ASAM Recommended Level of Treatment:     Substance use Disorder (SUD)   Recommendations for Services/Supports/Treatments:   Discharge Disposition:   DSM5 Diagnoses: Patient Active Problem List   Diagnosis Date Noted   Esophagitis determined by endoscopy 07/24/2020   Labral tear of shoulder, left, subsequent encounter 02/13/2020   Shoulder dislocation, left, subsequent encounter 02/13/2020   Seizure disorder ([place]) 12/22/2019   Migraines 12/22/2019   Mild intermittent asthma 12/22/2019   Migraine without aura and without status migrainosus, not intractable 06/29/2019    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) 10/06/2013   Spells 07/26/2013   Allergy to other foods 07/13/2013   Prophylactic immunotherapy 07/13/2013   Unspecified asthma, uncomplicated 44/96/7591   Eosinophilic esophagitis 63/84/6659   Gastroesophageal reflux 01/23/2013   Vomiting 01/23/2013   Leukopenia 10/19/2012   Muscle jerks during sleep 10/19/2012   Constipation 08/20/2012   Neutropenia ([place]) 08/20/2012   Allergic rhinitis due to pollen 02/19/2011     Referrals to Alternative Service(s):  [facility], [facility]

## 2021-01-28 NOTE — ED Provider Notes (Signed)
[facility] Medical Screening Exam  Patient Name: Terrence Riley MRN: [id number] Date of Evaluation: 01/28/21 Chief Complaint:   Diagnosis:  Final diagnoses:  Grief reaction  Panic attack as reaction to stress    History of Present illness: Terrence Riley is a 20 y.o. male with no psychiatric history brought in by [employer] due to mom's concerns about his "panicky" behavior since 6 AM. Today is the anniversary of his grandpa's death, so Terrence has had difficulty dealing with the loss. He endorses that he did have SI this morning but had no plan nor the desire to follow through, as he could not leave his family without him. During the interview, patient was tearful and tremulous, taking shallow breaths. He repeatedly stated that he can't be there for his family like he wants to, and he feels worthless and hopeless because of this. He stated that over the past three days, he has been feeling down, endorsing anhedonia, poor sleep, and guilt. He states that he typically fights through these issues on his own. After the episode this morning, he no longer endorses SI, nor has HI. He says that for years, he has experienced AH, hearing a man's voice say, "I'm a failure," but then he'll hear his grandpa say, "everything will be ok." He has also had VH of people in black and white suits. The white suits are people in his family who have passed on who he knows is looking out for him. The black suits are "bad people." He sees them walk past him but denies interactions. His VH and AH are non-distressing to him.  Psychiatric Specialty Exam  Presentation  General Appearance:Appropriate for Environment  Eye Contact:Absent (Patient was tearful with head in hands throughout interview)  Speech:Garbled (D/t crying)  Speech Volume:Normal  Handedness:No data recorded  Mood and Affect  Mood:Depressed; Hopeless; Worthless  Affect:Congruent; Depressed; Tearful   Thought Process  Thought  Processes:Coherent; Linear  Descriptions of Associations:No data recorded Orientation:Full (Time, Place and Person)  Thought Content:Logical  Diagnosis of Schizophrenia or Schizoaffective disorder in past: No  Duration of Psychotic Symptoms: Greater than six months  Hallucinations:Auditory; Visual Male voice saying "I'm a failure," ongoing for years, non-distressful Sees people in white and black suits;  Ideas of Reference:No data recorded Suicidal Thoughts:Yes, Passive (Earlier, had the thought but did not have the desire to complete) Without Intent; Without Plan; Without Means to Carry Out; Without Access to Means  Homicidal Thoughts:No   Sensorium  Memory:Immediate Good; Recent Good  Judgment:Fair  Insight:Good   Executive Functions  Concentration:Good  Attention Span:Good  [place]  Language:Good   Psychomotor Activity  Psychomotor Activity:Restlessness; Other (comment) (Tremulousness of shoulders and legs while crying)   Assets  Assets:Desire for Improvement; Housing; Physical Health; Resilience; Social Support   Sleep  Sleep:Poor  Number of hours:  No data recorded  No data recorded  Physical Exam: Physical Exam Constitutional:      General: He is in acute distress.     Comments: Emotional distress  HENT:     Head: Normocephalic and atraumatic.  Pulmonary:     Comments: Shallow breaths while crying Musculoskeletal:        General: Normal range of motion.  Neurological:     General: No focal deficit present.     Mental Status: He is alert and oriented to person, place, and time.   ROS Blood pressure 116/81, pulse 78, temperature 98.5 F (36.9 C), temperature source Oral, resp.  rate 18, SpO2 100 %. There is no height or weight on file to calculate BMI.  Musculoskeletal: Strength & Muscle Tone: within normal limits Gait & Station: normal Patient leans: N/A   [place] [facility] Discharge Disposition for Follow up and  Recommendations: Based on my evaluation the patient does not appear to have an emergency medical condition and can be discharged with resources and follow up care in outpatient services for Individual Therapy and potentially medication management. Resources provided for outpatient therapy and psychiatry. -Prescribed Atarax 25 mg TID PRN anxiety   Rosezetta Schlatter, MD 01/28/2021, 10:29 AM

## 2021-01-28 NOTE — BH Assessment (Signed)
TTS triage: Patient presents to [facility] voluntarily via [employer]. He is shaking, panting, and has difficulty answering this assessors questions. [employer] states patient's mother told them it was the anniversary of his father's death and he has had this presentation since around 6:00 AM. When asked if he is suicidal he states "earlier" but is unable to describe any plan. He denies HI. Does not answer questions regarding AVH. Denies any substance use.  Patient is emergent.

## 2021-01-28 NOTE — ED Notes (Signed)
AVS reviewed with patient and all questions answered.  Medications given per pharmacy.  All belongings returned to patient.  Patient discharged ambulatory in stable condition; no acute distress noted.

## 2021-01-28 NOTE — Discharge Instructions (Addendum)
Patient is instructed prior to discharge to: Take all medications as prescribed by his/her mental healthcare provider (Atarax 25 mg by mouth three times daily as needed for anxiety).  Report any adverse effects and or reactions from the medicines to his/her outpatient provider promptly.  Patient has been instructed & cautioned: To not engage in alcohol and or illegal drug use while on prescription medicines.  In the event of worsening symptoms, patient is instructed to call the crisis hotline, 911 and or go to the nearest ED for appropriate evaluation and treatment of symptoms.  To follow-up outpatient for therapy and/or medication management, please schedule an appointment with one of the clinics provided.  To follow-up with his primary care provider for your other medical issues, concerns and or health care needs.

## 2021-01-29 ENCOUNTER — Other Ambulatory Visit: Payer: Self-pay

## 2021-01-29 ENCOUNTER — Ambulatory Visit (INDEPENDENT_AMBULATORY_CARE_PROVIDER_SITE_OTHER): Payer: No Typology Code available for payment source | Admitting: Family Medicine

## 2021-01-29 VITALS — BP 110/64 | HR 61 | Temp 98.0°F | Ht 72.0 in | Wt 124.7 lb

## 2021-01-29 DIAGNOSIS — Z0000 Encounter for general adult medical examination without abnormal findings: Secondary | ICD-10-CM

## 2021-01-29 NOTE — Progress Notes (Signed)
Established Patient Office Visit  Subjective:  Patient ID: Terrence Riley, male    DOB: [birth year]  Age: 20 y.o. MRN: [id number]  CC:  Chief Complaint  Patient presents with   Annual Exam    No new concerns     HPI Terrence Riley presents for physical exam.  He has history of migraine headaches, seizure disorder, mild intermittent asthma, history of eosinophilic esophagitis, GERD.  Recent seizure.  He is on combination therapy for seizure has not had any seizure since last visit.  He had multiple recent labs and these were reviewed.  He has pending follow up with new neurologist in August.  He had severe anxiety symptoms yesterday and ended up being seen in the ER.  No active suicidal ideation.  His grandfather passed away a year ago at this time this created some increased grief symptoms.  Tetanus up-to-date.  Received J&J COVID-vaccine.  Social history-currently lives at home with his mother.  Also helps to take care of his grandmother.  He is hoping to enroll at [employer] this fall.  No regular alcohol use.  Non-smoker.  Denies illicit drug use.  Family history-mother with history of breast cancer.  Mother reportedly has history of protein S deficiency and he has a brother and sister who are alive and well  Past Medical History:  Diagnosis Date   Allergy    rhinitis   Asthma    as a child   Complication of anesthesia    pt. reports he need more anesthesia with the septoplasty surgery   Eosinophilic esophagitis    heartburn and reflux   Family history of adverse reaction to anesthesia    mother respiratory failure   Nasal fracture    deviated septum   Pneumonia    1x history of   Premature baby    2 months early   Seizures ([place])    well controlled on meds/ one 2 months ago when meds were late    Past Surgical History:  Procedure Laterality Date   ADENOIDECTOMY     CLOSED REDUCTION NASAL FRACTURE N/A 08/31/2017   Procedure: CLOSED  REDUCTION NASAL FRACTURE;  Surgeon: Clyde Canterbury, MD;  Location: [place];  Service: ENT;  Laterality: N/A;   SEPTOPLASTY N/A 08/31/2017   Procedure: SEPTOPLASTY;  Surgeon: Clyde Canterbury, MD;  Location: [place];  Service: ENT;  Laterality: N/A;   SHOULDER ARTHROSCOPY WITH LABRAL REPAIR Left 04/30/2020   Procedure: LEFT SHOULDER POSTERIOR LABRAL REPAIR WITH ARTHROSCOPY, BICEPS TENDON RELEASE AND TENODESIS, OPEN ALLOGRAFT FOR REVERSE BANKART LESION;  Surgeon: [name], MD;  Location: [place];  Service: Orthopedics;  Laterality: Left;   TONSILLECTOMY     and addenoids    Family History  Problem Relation Age of Onset   Cancer Mother        breat   Protein S deficiency Mother     Social History   Socioeconomic History   Marital status: Single    Spouse name: Not on file   Number of children: Not on file   Years of education: Not on file   Highest education level: Not on file  Occupational History   Not on file  Tobacco Use   Smoking status: Never   Smokeless tobacco: Never  Vaping Use   Vaping Use: Never used  Substance and Sexual Activity   Alcohol use: No   Drug use: No   Sexual activity: Not on file  Other Topics Concern   Not on file  Social History Narrative   Not on file   Social Determinants of Health   Financial Resource Strain: Not on file  Food Insecurity: Not on file  Transportation Needs: Not on file  Physical Activity: Not on file  Stress: Not on file  Social Connections: Not on file  Intimate Partner Violence: Not on file    Outpatient Medications Prior to Visit  Medication Sig Dispense Refill   clonazePAM (KLONOPIN) 1 MG tablet Take 1 tablet by mouth 2 (two) times daily.     EPINEPHrine 0.3 mg/0.3 mL IJ SOAJ injection Inject 0.3 mg into the muscle as needed for anaphylaxis. 1 each 0   hydrOXYzine (ATARAX/VISTARIL) 25 MG tablet Take 1 tablet (25 mg total) by mouth 3 (three) times daily as needed for up to 7 days for  anxiety. 21 tablet 0   levETIRAcetam (KEPPRA) 1000 MG tablet Take 1 tablet (1,000 mg total) by mouth 2 (two) times daily. 60 tablet 0   lidocaine (LIDODERM) 5 % Place 1 patch onto the skin daily. Remove & Discard patch within 12 hours or as directed by MD (Patient taking differently: Place 1 patch onto the skin daily as needed (pain). Remove & Discard patch within 12 hours or as directed by MD) 30 patch 0   topiramate (TOPAMAX) 200 MG tablet Take 1 tablet (200 mg total) by mouth 2 (two) times daily. 60 tablet 0   No facility-administered medications prior to visit.    Allergies  Allergen Reactions   Lortab [Hydrocodone-Acetaminophen] Hives   Other Anaphylaxis    Peanuts   Zofran [Ondansetron Hcl] Nausea And Vomiting   Citrus Other (See Comments)    Sores in mouth   Dairy Aid [Lactase] Nausea And Vomiting   Influenza Virus Vaccine Other (See Comments)   Soy Allergy Other (See Comments)    Seizures    Tylenol [Acetaminophen]     Can cause a fever and seizure activity    Eggs Or Egg-Derived Products Rash    Pt mother reports pt only avoids eggs alone but can tolerate as an ingredient. [facility] 07/27/13   Hydrocodone-Acetaminophen Rash    ROS Review of Systems  Constitutional:  Negative for activity change, appetite change, fatigue and fever.  HENT:  Negative for congestion, ear pain and trouble swallowing.   Eyes:  Negative for pain and visual disturbance.  Respiratory:  Negative for cough, shortness of breath and wheezing.   Cardiovascular:  Negative for chest pain and palpitations.  Gastrointestinal:  Negative for abdominal distention, abdominal pain, blood in stool, constipation, diarrhea, nausea, rectal pain and vomiting.  Genitourinary:  Negative for dysuria, hematuria and testicular pain.  Musculoskeletal:  Negative for arthralgias and joint swelling.  Skin:  Negative for rash.  Neurological:  Negative for dizziness, syncope and headaches.  Hematological:  Negative for  adenopathy.  Psychiatric/Behavioral:  Negative for confusion and suicidal ideas.      Objective:    Physical Exam Constitutional:      General: He is not in acute distress.    Appearance: He is well-developed.  HENT:     Head: Normocephalic and atraumatic.     Right Ear: Tympanic membrane and external ear normal.     Left Ear: Tympanic membrane and external ear normal.  Eyes:     Conjunctiva/sclera: Conjunctivae normal.     Pupils: Pupils are equal, round, and reactive to light.  Neck:     Thyroid: No thyromegaly.  Cardiovascular:  Rate and Rhythm: Normal rate and regular rhythm.     Heart sounds: Normal heart sounds. No murmur heard. Pulmonary:     Effort: No respiratory distress.     Breath sounds: No wheezing or rales.  Abdominal:     General: Bowel sounds are normal. There is no distension.     Palpations: Abdomen is soft. There is no mass.     Tenderness: There is no abdominal tenderness. There is no guarding or rebound.  Musculoskeletal:     Cervical back: Normal range of motion and neck supple.     Right lower leg: No edema.     Left lower leg: No edema.  Lymphadenopathy:     Cervical: No cervical adenopathy.  Skin:    Findings: No rash.  Neurological:     Mental Status: He is alert and oriented to person, place, and time.     Cranial Nerves: No cranial nerve deficit.     Deep Tendon Reflexes: Reflexes normal.  Psychiatric:        Mood and Affect: Mood normal.    BP 110/64 (BP Location: Left Arm, Patient Position: Sitting, Cuff Size: Normal)   Pulse 61   Temp 98 F (36.7 C) (Oral)   Ht 6' (1.829 m)   Wt 124 lb 11.2 oz (56.6 kg)   SpO2 98%   BMI 16.91 kg/m  Wt Readings from Last 3 Encounters:  01/29/21 124 lb 11.2 oz (56.6 kg)  01/22/21 126 lb 4.8 oz (57.3 kg)  01/16/21 130 lb 1.1 oz (59 kg)     Health Maintenance Due  Topic Date Due   COVID-19 Vaccine (2 - Booster for Janssen series) 02/25/2020    There are no preventive care reminders to  display for this patient.  Lab Results  Component Value Date   TSH 1.08 03/12/2020   Lab Results  Component Value Date   WBC 2.6 (L) 01/16/2021   HGB 14.3 01/16/2021   HCT 41.9 01/16/2021   MCV 84.8 01/16/2021   PLT 172 01/16/2021   Lab Results  Component Value Date   NA 137 01/16/2021   K 3.5 01/16/2021   CO2 23 01/16/2021   GLUCOSE 84 01/16/2021   BUN 8 01/16/2021   CREATININE 1.21 01/16/2021   BILITOT 1.0 01/16/2021   ALKPHOS 73 01/16/2021   AST 25 01/16/2021   ALT 21 01/16/2021   PROT 6.4 (L) 01/16/2021   ALBUMIN 4.0 01/16/2021   CALCIUM 9.1 01/16/2021   ANIONGAP 3 (L) 01/16/2021   Lab Results  Component Value Date   CHOL 125 03/12/2020   Lab Results  Component Value Date   HDL 49 03/12/2020   Lab Results  Component Value Date   LDLCALC 63 03/12/2020   Lab Results  Component Value Date   TRIG 51 03/12/2020   Lab Results  Component Value Date   CHOLHDL 2.6 03/12/2020   No results found for: HGBA1C    Assessment & Plan:   Physical exam.  History of seizure disorder and he has pending follow-up with neurology in August.  -We elected not to get labs today since he had multiple recent labs done through the ER.  He had lipid panel last August with favorable lipids. -Currently not sexually active.  We discussed STD prevention. -immunizations up to date.   No orders of the defined types were placed in this encounter.   Follow-up: No follow-ups on file.    Carolann Littler, MD

## 2021-02-05 ENCOUNTER — Other Ambulatory Visit: Payer: Self-pay | Admitting: Neurology

## 2021-02-05 NOTE — Telephone Encounter (Signed)
She has not been seen yet in our office, cannot Rx medication. She will need to ask her PCP or neurologist at [facility] until then. Thanks

## 2021-02-17 ENCOUNTER — Other Ambulatory Visit: Payer: Self-pay

## 2021-02-17 ENCOUNTER — Ambulatory Visit (INDEPENDENT_AMBULATORY_CARE_PROVIDER_SITE_OTHER): Payer: No Typology Code available for payment source | Admitting: Orthopedic Surgery

## 2021-02-17 ENCOUNTER — Encounter: Payer: Self-pay | Admitting: Orthopedic Surgery

## 2021-02-17 ENCOUNTER — Ambulatory Visit (INDEPENDENT_AMBULATORY_CARE_PROVIDER_SITE_OTHER): Payer: No Typology Code available for payment source

## 2021-02-17 DIAGNOSIS — S43004A Unspecified dislocation of right shoulder joint, initial encounter: Secondary | ICD-10-CM

## 2021-02-17 NOTE — Progress Notes (Signed)
Office Visit Note   Patient: Terrence Riley           Date of Birth: 2001/05/13           MRN: [id number] Visit Date: 02/17/2021 Requested by: Eulas Post, MD [place],  [place] [address] PCP: Eulas Post, MD  Subjective: Chief Complaint  Patient presents with   Right Shoulder - Pain    HPI: [name] is a 20 y.o. male who presents to the office complaining of right shoulder pain.  Patient states that he has had multiple dislocations of his right shoulder over the last several months.  He describes lateral shoulder pain as well as right trapezius pain and shoulder blade pain.  He states his right shoulder feels like his other shoulder did prior to surgery.  He has a history of left shoulder dislocation but since his procedure, his left shoulder feels much better and he has had no instability events in that left shoulder.  He states he has had maybe 10 dislocation events in the last several months.  He feels instability daily and felt it dislocate 3 times last night.  Cannot tell whether or not it is dislocating anteriorly or posteriorly.  He does have history of seizure disorder and he had multiple seizures in June 2022 after he went off of his medication to switch to a different seizure medication but after these episodes, he has now resumed his normal medication regimen and is seizure-free since then.  He had never dislocated the right shoulder up until 2022 but he cannot exactly quantify when his first dislocation event was or what it was.  He has never had to go to the emergency department for reduction and he is always been able to reduce the arm by himself.  He is afraid to use his arm due to the feelings of instability and it prevents him from enjoying his main hobby which is basketball.  He has not returned to playing pickle ball.  He states that his shoulder does not come out when he is shooting around unless he attempts a half [place] and then it  will come out..                ROS: All systems reviewed are negative as they relate to the chief complaint within the history of present illness.  Patient denies fevers or chills.  Assessment & Plan: Visit Diagnoses:  1. Dislocation of right shoulder joint, initial encounter     Plan: Patient is a 20 year old male who presents complaining of right shoulder instability.  He has had recent dislocation events of the right shoulder without any single right shoulder injury that led to the onset of all of these instability episodes.  He has never had a shoulder dislocation up until this year.  Cannot quantify whether it is dislocated anteriorly or posteriorly.  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  Left shoulder doing well.  Radiographs of the right shoulder today show shoulder is reduced but there appears to be a posterior glenoid fracture on the axillary view as well as suggestion of Hill-Sachs lesion on AP view.  Plan for MRI arthrogram of the right shoulder for further evaluation of right shoulder instability and bony Bankart lesion.  Follow-Up Instructions: No follow-ups on file.   Orders:  Orders Placed This Encounter  Procedures   XR Shoulder Right  MR SHOULDER RIGHT W CONTRAST   Arthrogram   No orders of the defined types were placed in this encounter.     Procedures: No procedures performed   Clinical Data: No additional findings.  Objective: Vital Signs: There were no vitals taken for this visit.  Physical Exam:  Constitutional: Patient appears well-developed HEENT:  Head: Normocephalic Eyes:EOM are normal Neck: Normal range of motion Cardiovascular: Normal rate Pulmonary/chest: Effort normal Neurologic: Patient is alert Skin: Skin is warm Psychiatric: Patient has normal mood and affect  Ortho Exam: On exam patient has a degrees external rotation, 100 degrees abduction, 180  degrees forward flexion of the right shoulder.  No sulcus sign.  Axillary nerve intact with deltoid firing.  Excellent rotator cuff strength of supra, infra, subscap rated 5/5.  2+ radial pulse of the right upper extremity.  Radial nerve intact with intact EPL and wrist extension.  Intact finger abduction, finger adduction.  Positive apprehension sign with arm placed in abduction/external rotation.  Patient feels subjective feelings of instability with his arm internally rotated across his body as well as posterior directed force.  Increased laxity to anterior and posterior directed forces.  Specialty Comments:  No specialty comments available.  Imaging: No results found.   PMFS History: Patient Active Problem List   Diagnosis Date Noted   Esophagitis determined by endoscopy 07/24/2020   Labral tear of shoulder, left, subsequent encounter 02/13/2020   Shoulder dislocation, left, subsequent encounter 02/13/2020   Seizure disorder ([place]) 12/22/2019   Migraines 12/22/2019   Mild intermittent asthma 12/22/2019   Migraine without aura and without status migrainosus, not intractable 06/29/2019   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   Altered mental status 03/12/2014   Lethargic 03/09/2014   Environmental allergies 03/01/2014   Gastroesophageal reflux disease with esophagitis 10/06/2013   Localization-related (focal) (partial) symptomatic epilepsy and epileptic syndromes with complex partial seizures, not intractable, without status epilepticus ([place]) 10/06/2013   Spells 07/26/2013   Allergy to other foods 07/13/2013   Prophylactic immunotherapy 07/13/2013   Unspecified asthma, uncomplicated 123456   Eosinophilic esophagitis 123456   Gastroesophageal reflux 01/23/2013   Vomiting 01/23/2013   Leukopenia 10/19/2012   Muscle jerks during sleep 10/19/2012   Constipation 08/20/2012   Neutropenia ([place]) 08/20/2012   Allergic rhinitis due to pollen 02/19/2011   Past Medical History:  Diagnosis  Date   Allergy    rhinitis   Asthma    as a child   Complication of anesthesia    pt. reports he need more anesthesia with the septoplasty surgery   Eosinophilic esophagitis    heartburn and reflux   Family history of adverse reaction to anesthesia    mother respiratory failure   Nasal fracture    deviated septum   Pneumonia    1x history of   Premature baby    2 months early   Seizures ([place])    well controlled on meds/ one 2 months ago when meds were late    Family History  Problem Relation Age of Onset   Cancer Mother        breat   Protein S deficiency Mother     Past Surgical History:  Procedure Laterality Date   ADENOIDECTOMY     CLOSED REDUCTION NASAL FRACTURE N/A 08/31/2017   Procedure: CLOSED REDUCTION NASAL FRACTURE;  Surgeon: Clyde Canterbury, MD;  Location: [place];  Service: ENT;  Laterality: N/A;   SEPTOPLASTY N/A 08/31/2017   Procedure: Valarie Merino;  Surgeon: Clyde Canterbury, MD;  Location: [place]  [facility];  Service: ENT;  Laterality: N/A;   SHOULDER ARTHROSCOPY WITH LABRAL REPAIR Left 04/30/2020   Procedure: LEFT SHOULDER POSTERIOR LABRAL REPAIR WITH ARTHROSCOPY, BICEPS TENDON RELEASE AND TENODESIS, OPEN ALLOGRAFT FOR REVERSE BANKART LESION;  Surgeon: [name], MD;  Location: [place];  Service: Orthopedics;  Laterality: Left;   TONSILLECTOMY     and addenoids   Social History   Occupational History   Not on file  Tobacco Use   Smoking status: Never   Smokeless tobacco: Never  Vaping Use   Vaping Use: Never used  Substance and Sexual Activity   Alcohol use: No   Drug use: No   Sexual activity: Not on file

## 2021-02-20 ENCOUNTER — Encounter: Payer: Self-pay | Admitting: Orthopedic Surgery

## 2021-03-05 ENCOUNTER — Ambulatory Visit
Admission: RE | Admit: 2021-03-05 | Discharge: 2021-03-05 | Disposition: A | Discharge status: Home / Self Care | Payer: No Typology Code available for payment source | Source: Ambulatory Visit | Attending: Orthopedic Surgery | Admitting: Orthopedic Surgery

## 2021-03-05 DIAGNOSIS — S43004A Unspecified dislocation of right shoulder joint, initial encounter: Secondary | ICD-10-CM

## 2021-03-05 IMAGING — MR MR SHOULDER*R* W/CM
6 series · 40 of 40 positions shown · IV contrast (agent unspecified)
Comparison: None.

CLINICAL DATA: Right shoulder dislocation 4 months ago.

EXAM:
MR ARTHROGRAM OF THE RIGHT SHOULDER
TECHNIQUE: Multiplanar, multisequence MR imaging of the right shoulder was
performed following the administration of intra-articular contrast.
CONTRAST:  See Injection Documentation.

[Series 3: T1 fat-sat · axial · 4.0mm · 0.27mm/px · z∈[-40,+44]mm · 6 of 18 slices shown (1 of 4)]
[im 1/18]
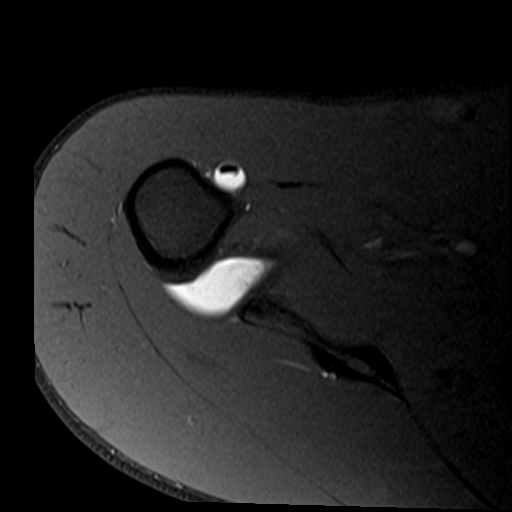
[im 4/18]
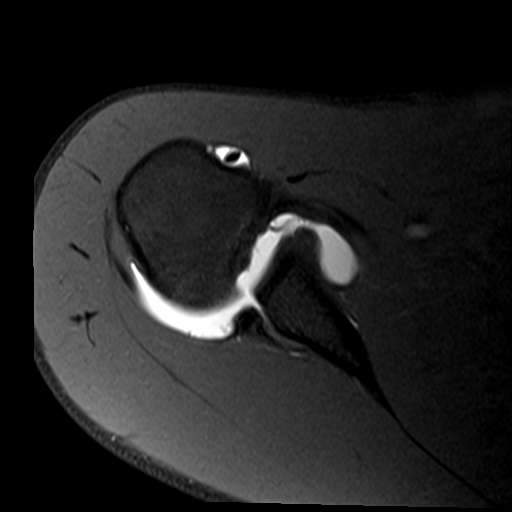
[im 7/18]
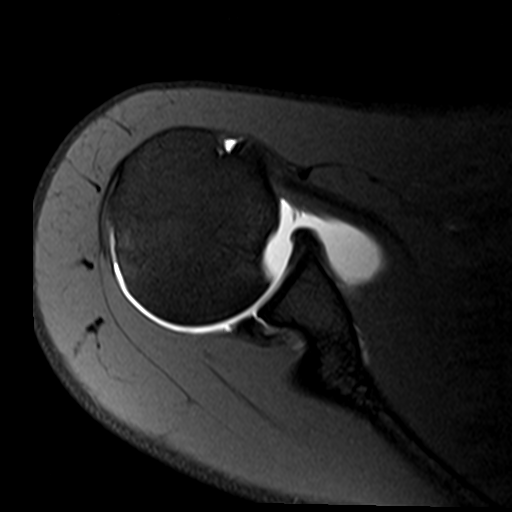
[im 11/18]
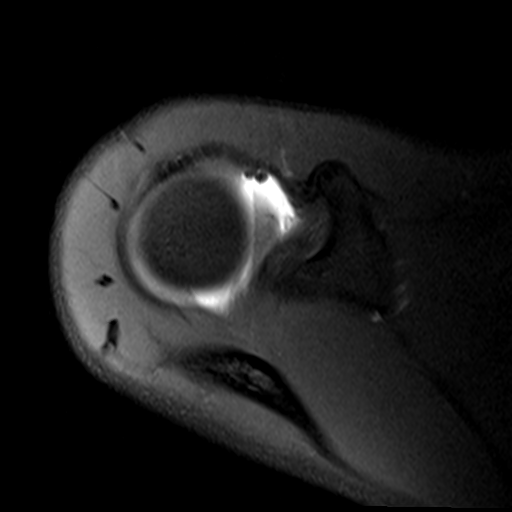
[im 14/18]
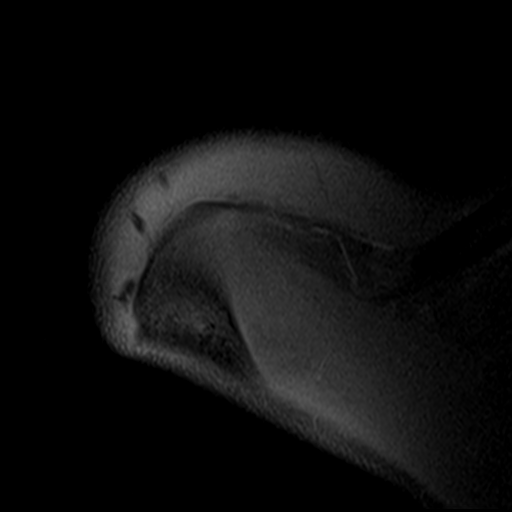
[im 18/18]
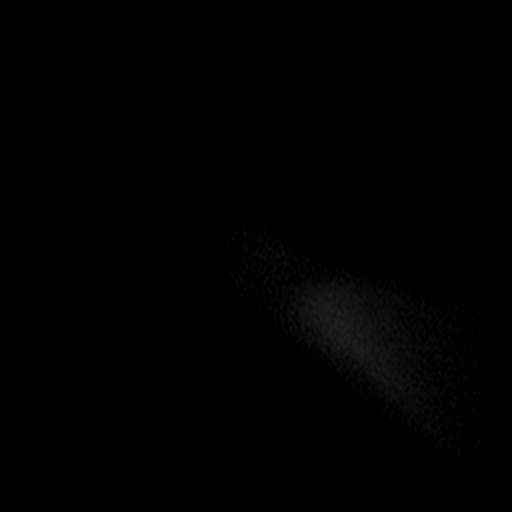

[Series 4: T2 fat-sat · oblique · 4.0mm · 0.55mm/px · 7 of 22 slices shown (1 of 2)]
[im 1/22]
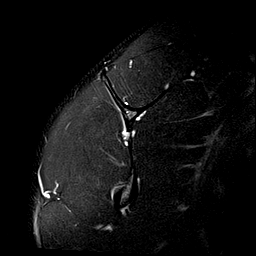
[im 4/22]
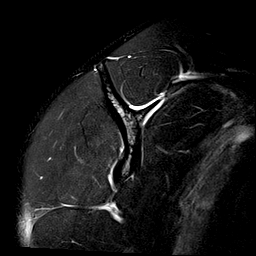
[im 8/22]
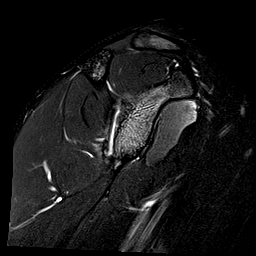
[im 11/22]
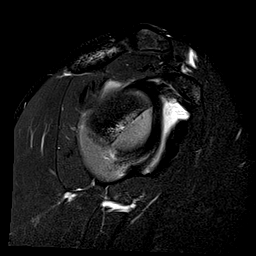
[im 15/22]
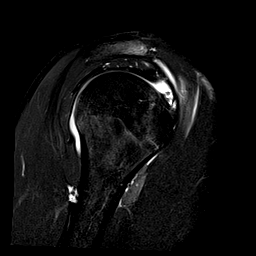
[im 18/22]
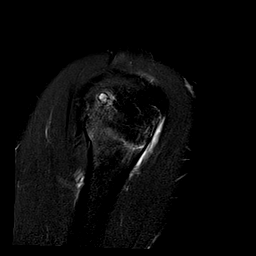
[im 22/22]
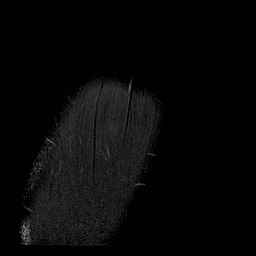

[Series 5: T1 fat-sat · oblique · 4.0mm · 0.55mm/px · 7 of 18 slices shown (2 of 4)]
[im 1/18]
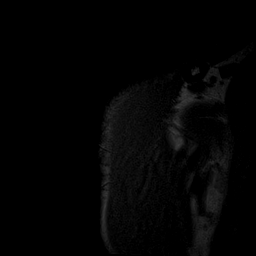
[im 3/18]
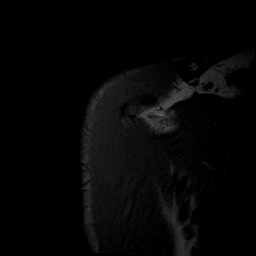
[im 6/18]
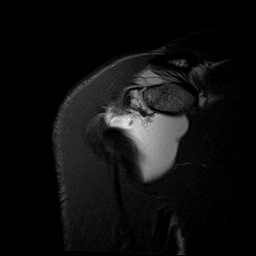
[im 9/18]
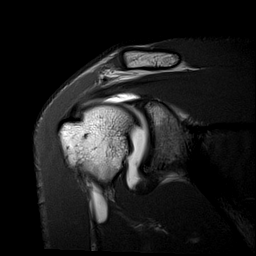
[im 12/18]
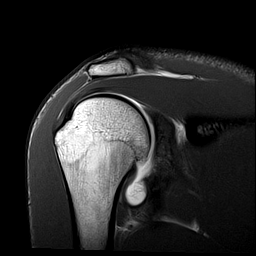
[im 15/18]
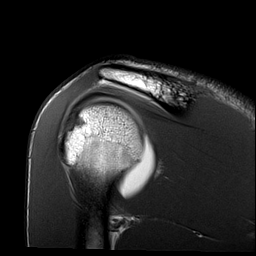
[im 18/18]
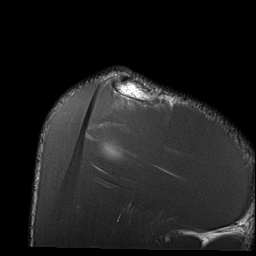

[Series 6: T1 fat-sat · oblique · 4.0mm · 0.55mm/px · 7 of 18 slices shown (3 of 4)]
[im 1/18]
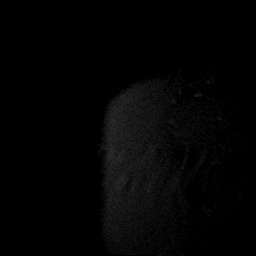
[im 3/18]
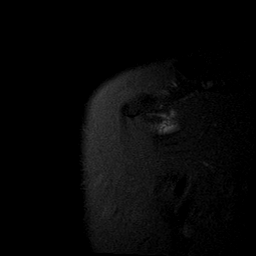
[im 6/18]
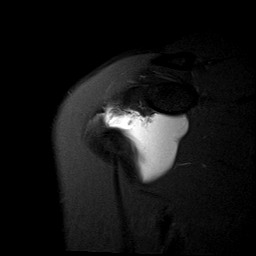
[im 9/18]
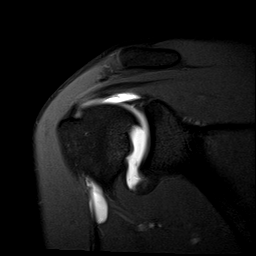
[im 12/18]
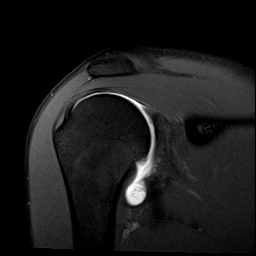
[im 15/18]
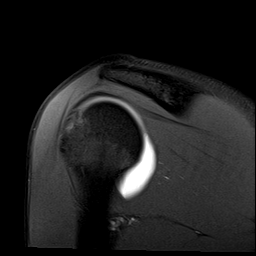
[im 18/18]
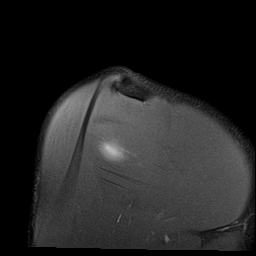

[Series 7: T2 fat-sat · oblique · 4.0mm · 0.55mm/px · 7 of 18 slices shown (2 of 2)]
[im 1/18]
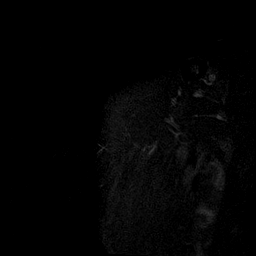
[im 3/18]
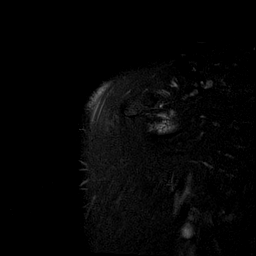
[im 6/18]
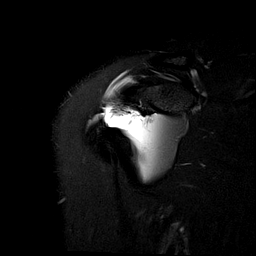
[im 9/18]
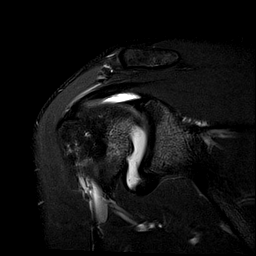
[im 12/18]
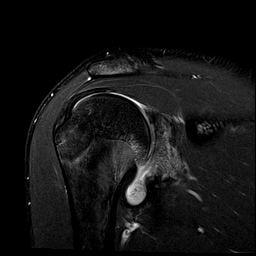
[im 15/18]
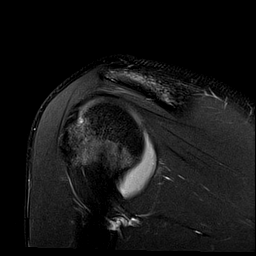
[im 18/18]
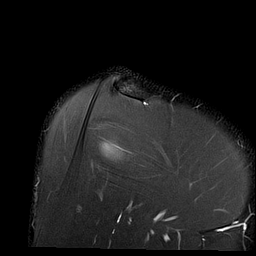

[Series 10: T1 fat-sat · oblique · 4.0mm · 0.59mm/px · 6 of 16 slices shown (4 of 4)]
[im 1/16]
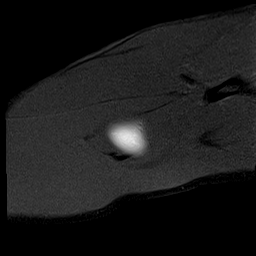
[im 4/16]
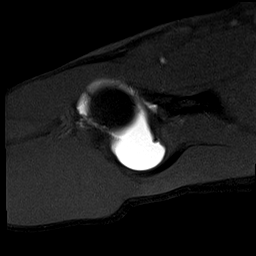
[im 7/16]
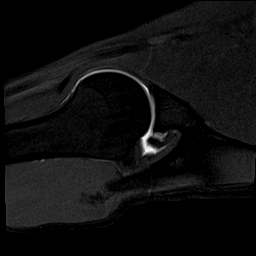
[im 10/16]
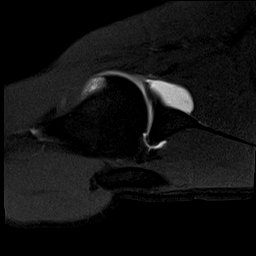
[im 13/16]
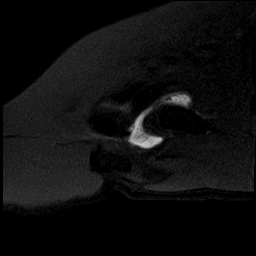
[im 16/16]
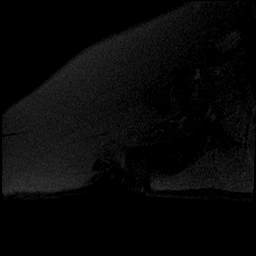

[40 of 40 positions shown; findings below may reference images not displayed]

FINDINGS: Rotator cuff: Supraspinatus tendon is intact. Mild tendinosis of the
infraspinatus tendon. Teres minor tendon is intact. Subscapularis
tendon is intact.

Muscles: No muscle atrophy or edema. No intramuscular fluid
collection or hematoma.

Biceps Long Head: Intraarticular and extraarticular portions of the
biceps tendon are intact.

Acromioclavicular Joint: Normal acromioclavicular joint. No
subacromial/subdeltoid bursal fluid.

Glenohumeral Joint: Intraarticular contrast distending the
glenohumeral joint. Normal glenohumeral ligaments. No chondral
defect.

Labrum: Posterior labral tear and fracture of the posterior glenoid.

Bones: No acute fracture or dislocation. Abnormal concavity
involving the anteromedial aspect of the humeral head and bone
marrow edema consistent with a reverse Hill Sachs lesion.
Subcortical reactive marrow changes at the infraspinatus insertion.

Other: No fluid collection or hematoma.
IMPRESSION: 1. Posterior labral tear and fracture of the posterior glenoid.
Abnormal concavity involving the anteromedial aspect of the humeral
head and bone marrow edema consistent with a reverse Hill Sachs
lesion. Overall findings are consistent with sequela posterior
shoulder dislocation.

## 2021-03-05 IMAGING — XA DG FLUORO GUIDE NDL PLC/BX
2 series · 2 of 2 positions shown · non-contrast
Comparison: none

CLINICAL DATA: Right shoulder pain.  Dislocations.

[Series 1: ortho standard · 1 of 1 slices shown (1 of 2)]
[im 1/1]
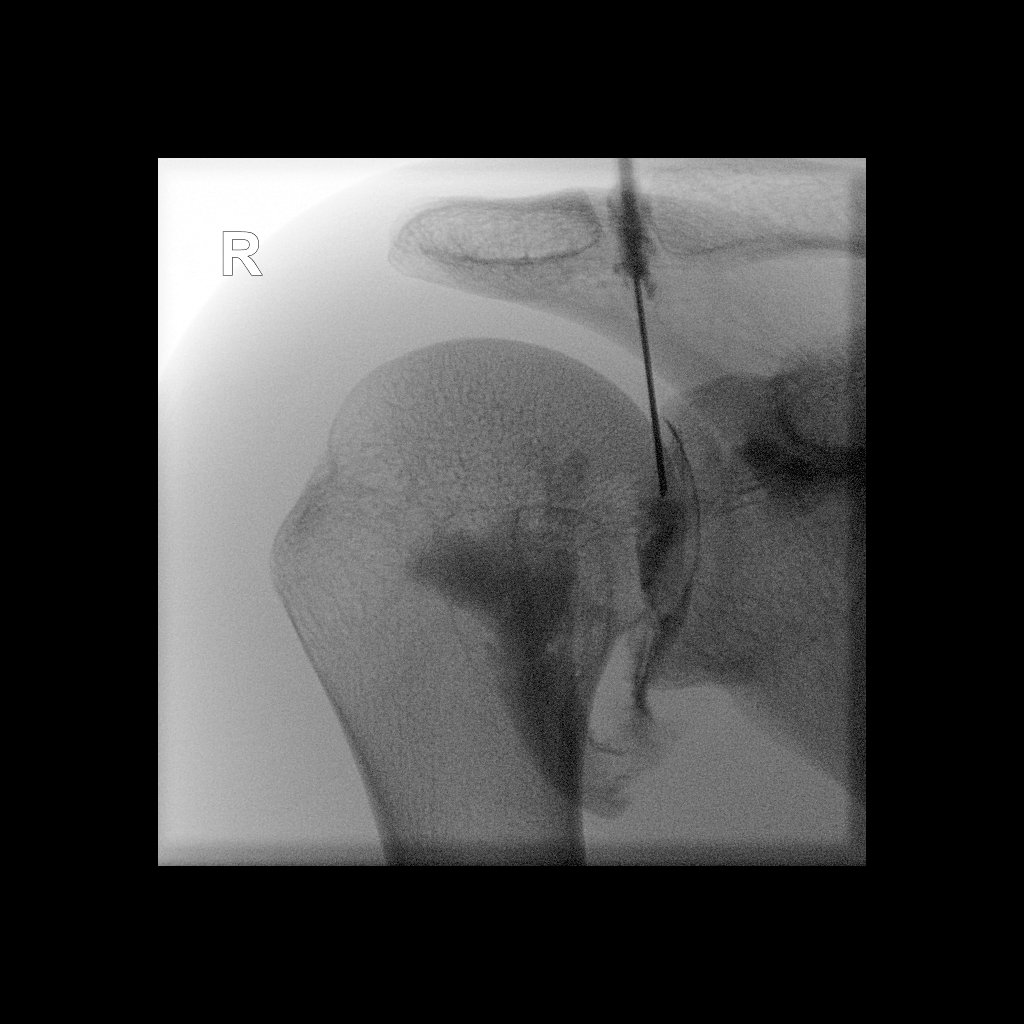

[Series 2: ortho standard · 1 of 1 slices shown (2 of 2)]
[im 1/1]
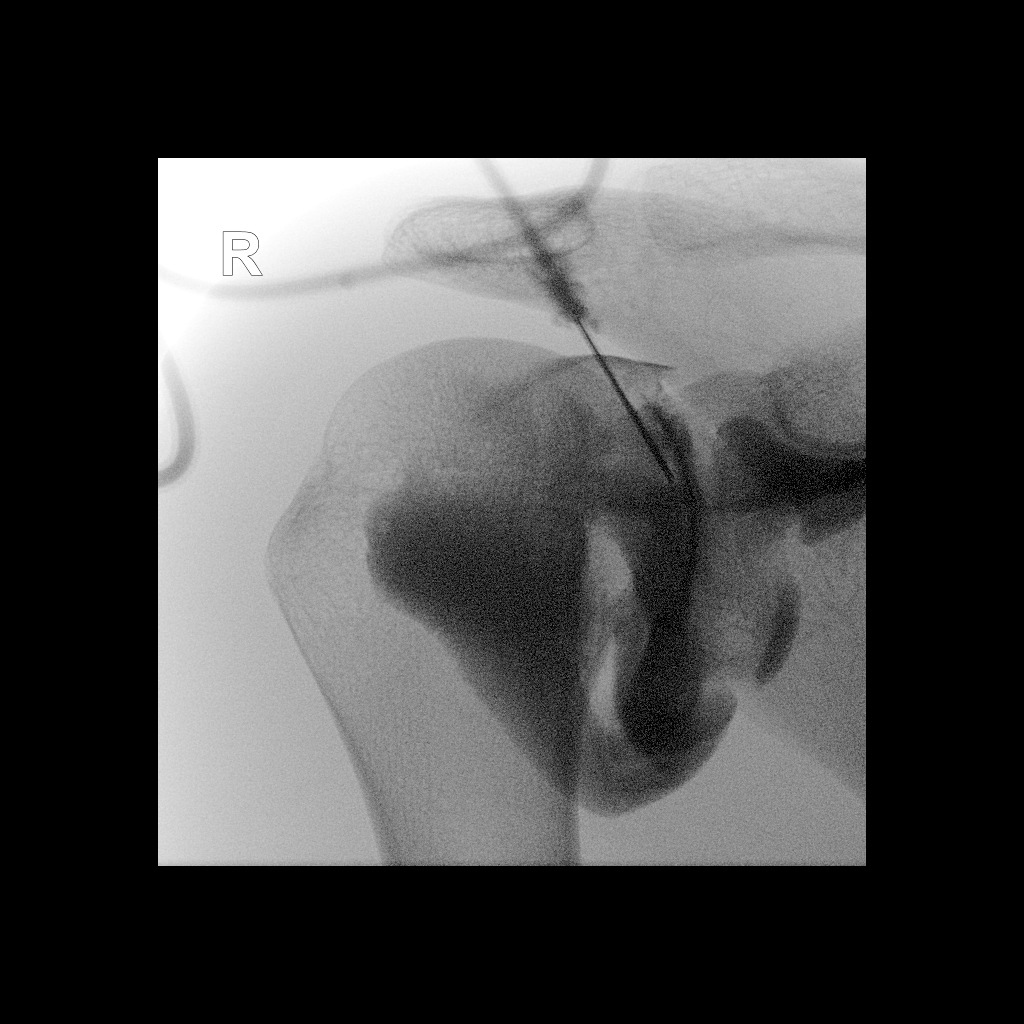

[2 of 2 positions shown; findings below may reference images not displayed]

FLUOROSCOPY TIME:  Radiation Exposure Index (as provided by the
fluoroscopic device): 5.11 uGy*m2

PROCEDURE:
Right SHOULDER INJECTION UNDER FLUOROSCOPY

Injection was performed by ROY, PA. Was present and in the
room providing direct supervision for the entire procedure.

An appropriate skin entrance site was determined. The site was
marked, prepped with Betadine, draped in the usual sterile fashion,
and infiltrated locally with buffered Lidocaine. Twenty gauge spinal
needle was advanced to the superomedial margin of the humeral head
under intermittent fluoroscopy. 1 ml of Lidocaine injected easily. A
mixture of 0.1 ml Multihance and 20 ml of dilute Isovue M 200 was
then used to opacify the right shoulder capsule. No immediate
complication.
IMPRESSION: Technically successful right shoulder injection for MRI.

## 2021-03-05 MED ORDER — IOPAMIDOL (ISOVUE-M 200) INJECTION 41%
20.0000 mL | Freq: Once | INTRAMUSCULAR | Status: AC
  Administered 2021-03-05: 20 mL via INTRA_ARTICULAR

## 2021-03-10 ENCOUNTER — Other Ambulatory Visit: Payer: Self-pay

## 2021-03-10 ENCOUNTER — Ambulatory Visit (INDEPENDENT_AMBULATORY_CARE_PROVIDER_SITE_OTHER): Payer: No Typology Code available for payment source | Admitting: Orthopedic Surgery

## 2021-03-10 DIAGNOSIS — S43004A Unspecified dislocation of right shoulder joint, initial encounter: Secondary | ICD-10-CM

## 2021-03-14 ENCOUNTER — Encounter: Payer: Self-pay | Admitting: Orthopedic Surgery

## 2021-03-14 NOTE — Progress Notes (Signed)
Office Visit Note   Patient: Terrence Riley           Date of Birth: 21-Dec-2000           MRN: [id number] Visit Date: 03/10/2021 Requested by: Eulas Post, MD [place],  [place] [address] PCP: Eulas Post, MD  Subjective: Chief Complaint  Patient presents with   Other     MRI review    HPI: Patient presents for follow-up of right shoulder MRI scan.  He has had multiple dislocation events since January affecting the right shoulder.  Not quite as bad as his left shoulder.  Patient underwent left shoulder posterior labral repair and osteochondral allografting of a humeral head defect on the right-hand side.  He has had an MRI scan on the right-hand side since his last visit which shows intact rotator cuff with posterior labral tear and fracture of the posterior glenoid.  That fracture of the glenoid is not particularly displaced with no step-off.  There is also concavity in the anterior humeral head consistent with reverse Hill-Sachs lesion.  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              ROS: All systems reviewed are negative as they relate to the chief complaint within the history of present illness.  Patient denies  fevers or chills.   Assessment & Plan: Visit Diagnoses:  1. Dislocation of right shoulder joint, initial encounter     Plan: Impression is right shoulder posterior labral tear glenoid fracture which looks to be healed in a reasonable position without displacement and anterior concavity consistent with reverse Hill-Sachs lesion.  Essentially we have to repeat the same procedure on the right shoulder that we did on the left.  That would be labral repair and/or screw fixation of that glenoid with subsequent humeral head allograft placement into that concavity.  Risk and benefits of the procedure discussed with the patient including not limited to infection nerve vessel damage shoulder stiffness as well as potential  need for further surgery.  We will also do a biceps tenodesis at the same time as the surgery.  Patient understands risk and benefits and wishes to proceed.  All questions answered.  Follow-Up Instructions: No follow-ups on file.   Orders:  No orders of the defined types were placed in this encounter.  No orders of the defined types were placed in this encounter.     Procedures: No procedures performed   Clinical Data: No additional findings.  Objective: Vital Signs: There were no vitals taken for this visit.  Physical Exam:   Constitutional: Patient appears well-developed HEENT:  Head: Normocephalic Eyes:EOM are normal Neck: Normal range of motion Cardiovascular: Normal rate Pulmonary/chest: Effort normal Neurologic: Patient is alert Skin: Skin is warm Psychiatric: Patient has normal mood and affect   Ortho Exam: Ortho exam demonstrates functional deltoid.  Rotator cuff strength on the right is intact infraspinatus supraspinatus and subscap muscle testing.  Patient does have a posterior apprehension but no anterior apprehension.  Less than a centimeter sulcus sign.  Not much in the way of coarse grinding or crepitus with active or passive range of motion of that right shoulder particularly with posterior directed axial loading.  Specialty Comments:  No specialty comments available.  Imaging: No results found.   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   Altered mental status 03/12/2014   Lethargic 03/09/2014   Environmental allergies 03/01/2014    Gastroesophageal reflux disease with esophagitis 10/06/2013   Localization-related (focal) (partial) symptomatic epilepsy and epileptic syndromes with complex partial seizures, not intractable, without status epilepticus ([place]) 10/06/2013   Spells 07/26/2013   Allergy to other foods 07/13/2013   Prophylactic immunotherapy 07/13/2013   Unspecified asthma, uncomplicated 123456   Eosinophilic esophagitis 123456   Gastroesophageal reflux 01/23/2013   Vomiting 01/23/2013   Leukopenia 10/19/2012   Muscle jerks during sleep 10/19/2012   Constipation 08/20/2012   Neutropenia ([place]) 08/20/2012   Allergic rhinitis due to pollen 02/19/2011   Past Medical History:  Diagnosis Date   Allergy    rhinitis   Asthma    as a child   Complication of anesthesia    pt. reports he need more anesthesia with the septoplasty surgery   Eosinophilic esophagitis    heartburn and reflux   Family history of adverse reaction to anesthesia    mother respiratory failure   Nasal fracture    deviated septum   Pneumonia    1x history of   Premature baby    2 months early   Seizures ([place])    well controlled on meds/ one 2 months ago when meds were late    Family History  Problem Relation Age of Onset   Cancer Mother        breat   Protein S deficiency Mother     Past Surgical History:  Procedure Laterality Date   ADENOIDECTOMY     CLOSED REDUCTION NASAL FRACTURE N/A 08/31/2017   Procedure: CLOSED REDUCTION NASAL FRACTURE;  Surgeon: Clyde Canterbury, MD;  Location: [facility];  Service: ENT;  Laterality: N/A;   SEPTOPLASTY N/A 08/31/2017   Procedure: SEPTOPLASTY;  Surgeon: Clyde Canterbury, MD;  Location: [facility];  Service: ENT;  Laterality: N/A;   SHOULDER ARTHROSCOPY WITH LABRAL REPAIR Left 04/30/2020   Procedure: LEFT SHOULDER POSTERIOR LABRAL REPAIR WITH ARTHROSCOPY, BICEPS TENDON RELEASE AND TENODESIS, OPEN ALLOGRAFT FOR REVERSE BANKART LESION;  Surgeon: [name], MD;   Location: [facility];  Service: Orthopedics;  Laterality: Left;   TONSILLECTOMY     and addenoids   Social History   Occupational History   Not on file  Tobacco Use   Smoking status: Never   Smokeless tobacco: Never  Vaping Use   Vaping Use: Never used  Substance and Sexual Activity   Alcohol use: No   Drug use: No   Sexual activity: Not on file

## 2021-03-24 ENCOUNTER — Ambulatory Visit: Payer: No Typology Code available for payment source | Admitting: Neurology

## 2021-03-27 ENCOUNTER — Ambulatory Visit (HOSPITAL_COMMUNITY)
Admission: EM | Admit: 2021-03-27 | Discharge: 2021-03-27 | Disposition: A | Discharge status: Home / Self Care | Payer: No Typology Code available for payment source

## 2021-03-27 ENCOUNTER — Other Ambulatory Visit: Payer: Self-pay

## 2021-03-27 DIAGNOSIS — F333 Major depressive disorder, recurrent, severe with psychotic symptoms: Secondary | ICD-10-CM

## 2021-03-27 NOTE — BH Assessment (Signed)
Pt reports calling [employer] due to having suicidal ideaitons. Pt guarded and reports he does not know why he is suicidal but has had thoughts for months. Denies HI, AVH and substance use.

## 2021-03-27 NOTE — Discharge Instructions (Addendum)
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
## 2021-03-27 NOTE — BH Assessment (Signed)
Comprehensive Clinical Assessment (CCA) Note  03/27/2021 ARIN TAPSCOTT [id number]  DISPOSITION: Per Beatriz Stallion, NP, pt is psych cleared for discharge home.   The patient demonstrates the following risk factors for suicide: Chronic risk factors for suicide include: psychiatric disorder of MDD and anxiety . Acute risk factors for suicide include: family or marital conflict. Protective factors for this patient include: positive social support and hope for the future. Considering these factors, the overall suicide risk at this point appears to be low. Patient is appropriate for outpatient follow up.  [place] ED from 03/27/2021 in [facility] ED from 01/28/2021 in [facility] ED from 01/16/2021 in [place] Low Risk No Risk No Risk      Pt reports calling [employer] due to having suicidal ideations. Pt guarded and reports he does not know why he is suicidal but has had thoughts for months. Pt reported he got scared because "I could see myself doing it." Pt stated he was thinking of drowning or cutting his throat but added he would be scared to actually try. Pt stated he has SI about once a month. Pt denies any further anxiety attack such as he experienced on 01/28/21. Pt reported that he had an argument with his mother and brother over an ex-girlfriend and hated being at odds with them. Pt denies HI, AVH, paranoia and drug/alcohol use. No hx of trauma or abuse. 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 and plans a future including attending college for an advanced degree. Pt became tearful when asked about grief over losing his GF. Pt was not open to OP therapy or medication and has no hx of either. Patient reports he doesn't sleep much. Pt reports missing work due to struggling to manage anxiety and depression episodes.   Patient was of average stature, underweight and average build with  normal grooming and casual dress. Posture/gait, movement, concentration, and memory within normal limits. Somewhat distracted attention and concentration and oriented to person, time, place and situation. Mood was blunted and affect was congruent with mood. Fleeting eye contact and restricted facial expressions. Patient was cooperative and a bit guarded although forthcoming with information when asked. Speech, thought content and organization was within normal limits. Appeared to have average intelligence with fair judgment and insight but within normal limits for age.   NP contacted pt's mother, Olivia Mackie, with pt's permission for collateral. Per NP, mother had no safety concerns for pt to be discharged.   Chief Complaint:  Chief Complaint  Patient presents with   Suicidal   Visit Diagnosis:  MDD. Recurrent, Moderate GAD    CCA Screening, Triage and Referral (STR)  Patient Reported Information How did you hear about [place]? Self  What Is the Reason for Your Visit/Call Today? Pt reports calling [employer] due to having suicidal ideations. Pt guarded and reports he does not know why he is suicidal but has had thoughts for months. Pt reported he got scared because "I could see myself doing it." Pt stated he was thinking of drowning or cutting his throat but added he would be scared to actually try. Pt stated he has SI about once a month. Pt denies any further anxiety attack such as he experienced on 01/28/21. Pt reported that he had an argument with his mother and brother over an ex-girlfriend and hated being at odds with them. Pt denies HI, AVH, paranoia and drug/alcohol use. No  hx of trauma or abuse. 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 and plans a future including attending college for an advanced degree. Pt became tearful when asked about grief over losing his GF. Pt was not open to OP therapy or medication and has no hx of either.  How Long Has This Been Causing You Problems? > than 6 months  What Do  You Feel Would Help You the Most Today? -- ("not sure")   Have You Recently Had Any Thoughts About Hurting Yourself? Yes  Are You Planning to Commit Suicide/Harm Yourself At This time? No (no true intent)   Have you Recently Had Thoughts About [place]? No  Are You Planning to Harm Someone at This Time? No  Explanation: No data recorded  Have You Used Any Alcohol or Drugs in the Past 24 Hours? No  How Long Ago Did You Use Drugs or Alcohol? No data recorded What Did You Use and How Much? No data recorded  Do You Currently Have a Therapist/Psychiatrist? No  Name of Therapist/Psychiatrist: No data recorded  Have You Been Recently Discharged From Any Office Practice or Programs? No  Explanation of Discharge From Practice/Program: No data recorded    CCA Screening Triage Referral Assessment Type of Contact: Face-to-Face  Telemedicine Service Delivery:   Is this Initial or Reassessment? No data recorded Date Telepsych consult ordered in [facility]:  No data recorded Time Telepsych consult ordered in [facility]:  No data recorded Location of Assessment: [facility] Assessment Services  Provider Location: [facility] Assessment Services   Collateral Involvement: NP, Beatriz Stallion, called pt's mom with his permission.   Does Patient Have a Stage manager Guardian? No data recorded Name and Contact of Legal Guardian: No data recorded If Minor and Not Living with Parent(s), Who has Custody? No data recorded Is CPS involved or ever been involved? -- (uta)  Is APS involved or ever been involved? -- Pinc[place] Badder)   Patient Determined To Be At Risk for Harm To Self or Others Based on Review of Patient Reported Information or Presenting Complaint? No  Method: No data recorded Availability of Means: No data recorded Intent: No data recorded Notification Required: No data recorded Additional Information for Danger to Others Potential: No data recorded Additional Comments for Danger to Others  Potential: No data recorded Are There Guns or Other Weapons in Your Home? No data recorded Types of Guns/Weapons: No data recorded Are These Weapons Safely Secured?                            No data recorded Who Could Verify You Are Able To Have These Secured: No data recorded Do You Have any Outstanding Charges, Pending Court Dates, Parole/Probation? No data recorded Contacted To Inform of Risk of Harm To Self or Others: Family/Significant Other:    Does Patient Present under Involuntary Commitment? No  IVC Papers Initial File Date: No data recorded  [place] of Residence: [place]   Patient Currently Receiving the Following Services: Not Receiving Services   Determination of Need: Routine (7 days) (Per NP Beatriz Stallion. Psych cleared and able to be discharged.)   Options For Referral: Medication Management; Outpatient Therapy     CCA Biopsychosocial Patient Reported Schizophrenia/Schizoaffective Diagnosis in Past: No   Strengths: Some insight, has family support   Mental Health Symptoms Depression:   Hopelessness; Tearfulness; Worthlessness; Sleep (too much or little)   Duration of Depressive symptoms:  Duration of Depressive Symptoms: Greater than two weeks   Mania:   None   Anxiety:    Worrying; Tension; Sleep; Restlessness; Difficulty concentrating   Psychosis:   None   Duration of Psychotic symptoms:  Duration of Psychotic Symptoms: Greater than six months   Trauma:   None   Obsessions:   None   Compulsions:   None   Inattention:   None   Hyperactivity/Impulsivity:   None   Oppositional/Defiant Behaviors:   N/A   Emotional Irregularity:   Mood lability   Other Mood/Personality Symptoms:   occasional acute anxiety episodes    Mental Status Exam Appearance and self-care  Stature:   Average   Weight:   Thin   Clothing:   Neat/clean   Grooming:   Normal   Cosmetic use:   None   Posture/gait:   Tense   Motor activity:    Restless   Sensorium  Attention:   Normal   Concentration:   Anxiety interferes   Orientation:   Person; Place; Situation; Time   Recall/memory:   Normal   Affect and Mood  Affect:   Restricted; Tearful   Mood:   Anxious   Relating  Eye contact:   Fleeting   Facial expression:   Anxious; Constricted; Sad   Attitude toward examiner:   Cooperative   Thought and Language  Speech flow:  Soft; Paucity; Clear and Coherent   Thought content:   Appropriate to Mood and Circumstances   Preoccupation:   None   Hallucinations:   None (reports AH - male voice - non-command type and denies distress)   Organization:  No data recorded  [employer] of Knowledge:   Average   Intelligence:   Average   Abstraction:   Normal   Judgement:   Fair   Reality Testing:   Adequate   Insight:   Fair   Decision Making:   Normal   Social Functioning  Social Maturity:   Responsible   Social Judgement:   Normal   Stress  Stressors:   Grief/losses; Family conflict   Coping Ability:   Overwhelmed; Deficient supports   Skill Deficits:   Self-care; Self-control; Interpersonal   Supports:   Family; Friends/Service system; Support needed     Religion: Religion/Spirituality Are You A Religious Person?: Yes  Leisure/Recreation: Leisure / Recreation Do You Have Hobbies?: Yes Leisure and Hobbies: basketball and science  Exercise/Diet: Exercise/Diet Do You Exercise?: Yes What Type of Exercise Do You Do?: Other (Comment) (basketball) Have You Gained or Lost A Significant Amount of Weight in the Past Six Months?: No Do You Follow a Special Diet?: No Do You Have Any Trouble Sleeping?: Yes Explanation of Sleeping Difficulties: Patient reports he doesn't sleep much.   CCA Employment/Education Employment/Work Situation: Employment / Work Situation Employment Situation: Employed Work Stressors: Pt reports missing work due to struggling to  manage anxiety and depression episodes. . Patient's Job has Been Impacted by Current Illness: Yes Describe how Patient's Job has Been Impacted: Unable to go to work recently - missed several days Has Patient ever Been in the [employer]?: No  Education: Museum/gallery curator Currently Attending: [employer] Last Grade Completed: 12 Did You Attend College?: Yes What Type of College Degree Do you Have?: Currently taking classes at [employer] Did You Have An Individualized Education Program (IIEP): No Did You Have Any Difficulty At School?: No Patient's Education Has Been Impacted by Current Illness: Yes   CCA Family/Childhood History Family and Relationship History:  Family history Marital status: Single Does patient have children?: No  Childhood History:  Childhood History By whom was/is the patient raised?: Mother Did patient suffer any verbal/emotional/physical/sexual abuse as a child?: No Did patient suffer from severe childhood neglect?: No Has patient ever been sexually abused/assaulted/raped as an adolescent or adult?: No Was the patient ever a victim of a crime or a disaster?: No Witnessed domestic violence?: No Has patient been affected by domestic violence as an adult?: No  Child/Adolescent Assessment:     CCA Substance Use Alcohol/Drug Use: Alcohol / Drug Use Pain Medications: See MAR Prescriptions: See MAR Over the Counter: See MAR History of alcohol / drug use?: No history of alcohol / drug abuse                         ASAM's:  Six Dimensions of Multidimensional Assessment  Dimension 1:  Acute Intoxication and/or Withdrawal Potential:      Dimension 2:  Biomedical Conditions and Complications:      Dimension 3:  Emotional, Behavioral, or Cognitive Conditions and Complications:     Dimension 4:  Readiness to Change:     Dimension 5:  Relapse, Continued use, or Continued Problem Potential:     Dimension 6:  Recovery/Living Environment:     ASAM Severity Score:     ASAM Recommended Level of Treatment:     Substance use Disorder (SUD)    Recommendations for Services/Supports/Treatments:    Discharge Disposition:    DSM5 Diagnoses: Patient Active Problem List   Diagnosis Date Noted   Esophagitis determined by endoscopy 07/24/2020   Labral tear of shoulder, left, subsequent encounter 02/13/2020   Shoulder dislocation, left, subsequent encounter 02/13/2020   Seizure disorder ([place]) 12/22/2019   Migraines 12/22/2019   Mild intermittent asthma 12/22/2019   Migraine without aura and without status migrainosus, not intractable 06/29/2019   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   Altered mental status 03/12/2014   Lethargic 03/09/2014   Environmental allergies 03/01/2014   Gastroesophageal reflux disease with esophagitis 10/06/2013   Localization-related (focal) (partial) symptomatic epilepsy and epileptic syndromes with complex partial seizures, not intractable, without status epilepticus ([place]) 10/06/2013   Spells 07/26/2013   Allergy to other foods 07/13/2013   Prophylactic immunotherapy 07/13/2013   Unspecified asthma, uncomplicated 123456   Eosinophilic esophagitis 123456   Gastroesophageal reflux 01/23/2013   Vomiting 01/23/2013   Leukopenia 10/19/2012   Muscle jerks during sleep 10/19/2012   Constipation 08/20/2012   Neutropenia ([place]) 08/20/2012   Allergic rhinitis due to pollen 02/19/2011     Referrals to Alternative Service(s): Referred to Alternative Service(s):   Place:   Date:   Time:    Referred to Alternative Service(s):   Place:   Date:   Time:    Referred to Alternative Service(s):   Place:   Date:   Time:    Referred to Alternative Service(s):   Place:   Date:   Time:     Fuller Mandril, Counselor  Stanton Kidney T. Mare Ferrari, [place], [facility], [facility] Triage Specialist [facility]

## 2021-04-09 ENCOUNTER — Ambulatory Visit: Payer: No Typology Code available for payment source | Admitting: Neurology

## 2021-04-10 ENCOUNTER — Other Ambulatory Visit: Payer: Self-pay

## 2021-04-10 ENCOUNTER — Emergency Department (HOSPITAL_COMMUNITY)
Admission: EM | Admit: 2021-04-10 | Discharge: 2021-04-11 | Disposition: A | Discharge status: Home / Self Care | Payer: No Typology Code available for payment source | Attending: Emergency Medicine | Admitting: Emergency Medicine

## 2021-04-10 ENCOUNTER — Emergency Department (HOSPITAL_COMMUNITY): Payer: No Typology Code available for payment source

## 2021-04-10 DIAGNOSIS — R519 Headache, unspecified: Secondary | ICD-10-CM | POA: Insufficient documentation

## 2021-04-10 DIAGNOSIS — R42 Dizziness and giddiness: Secondary | ICD-10-CM | POA: Diagnosis not present

## 2021-04-10 DIAGNOSIS — R0789 Other chest pain: Secondary | ICD-10-CM | POA: Insufficient documentation

## 2021-04-10 DIAGNOSIS — Z5321 Procedure and treatment not carried out due to patient leaving prior to being seen by health care provider: Secondary | ICD-10-CM | POA: Insufficient documentation

## 2021-04-10 IMAGING — CR DG CHEST 2V
2 series · 2 of 2 positions shown · non-contrast
Comparison: [DATE]

CLINICAL DATA: Chest pain

EXAM:
CHEST - 2 VIEW

[w chest pa]
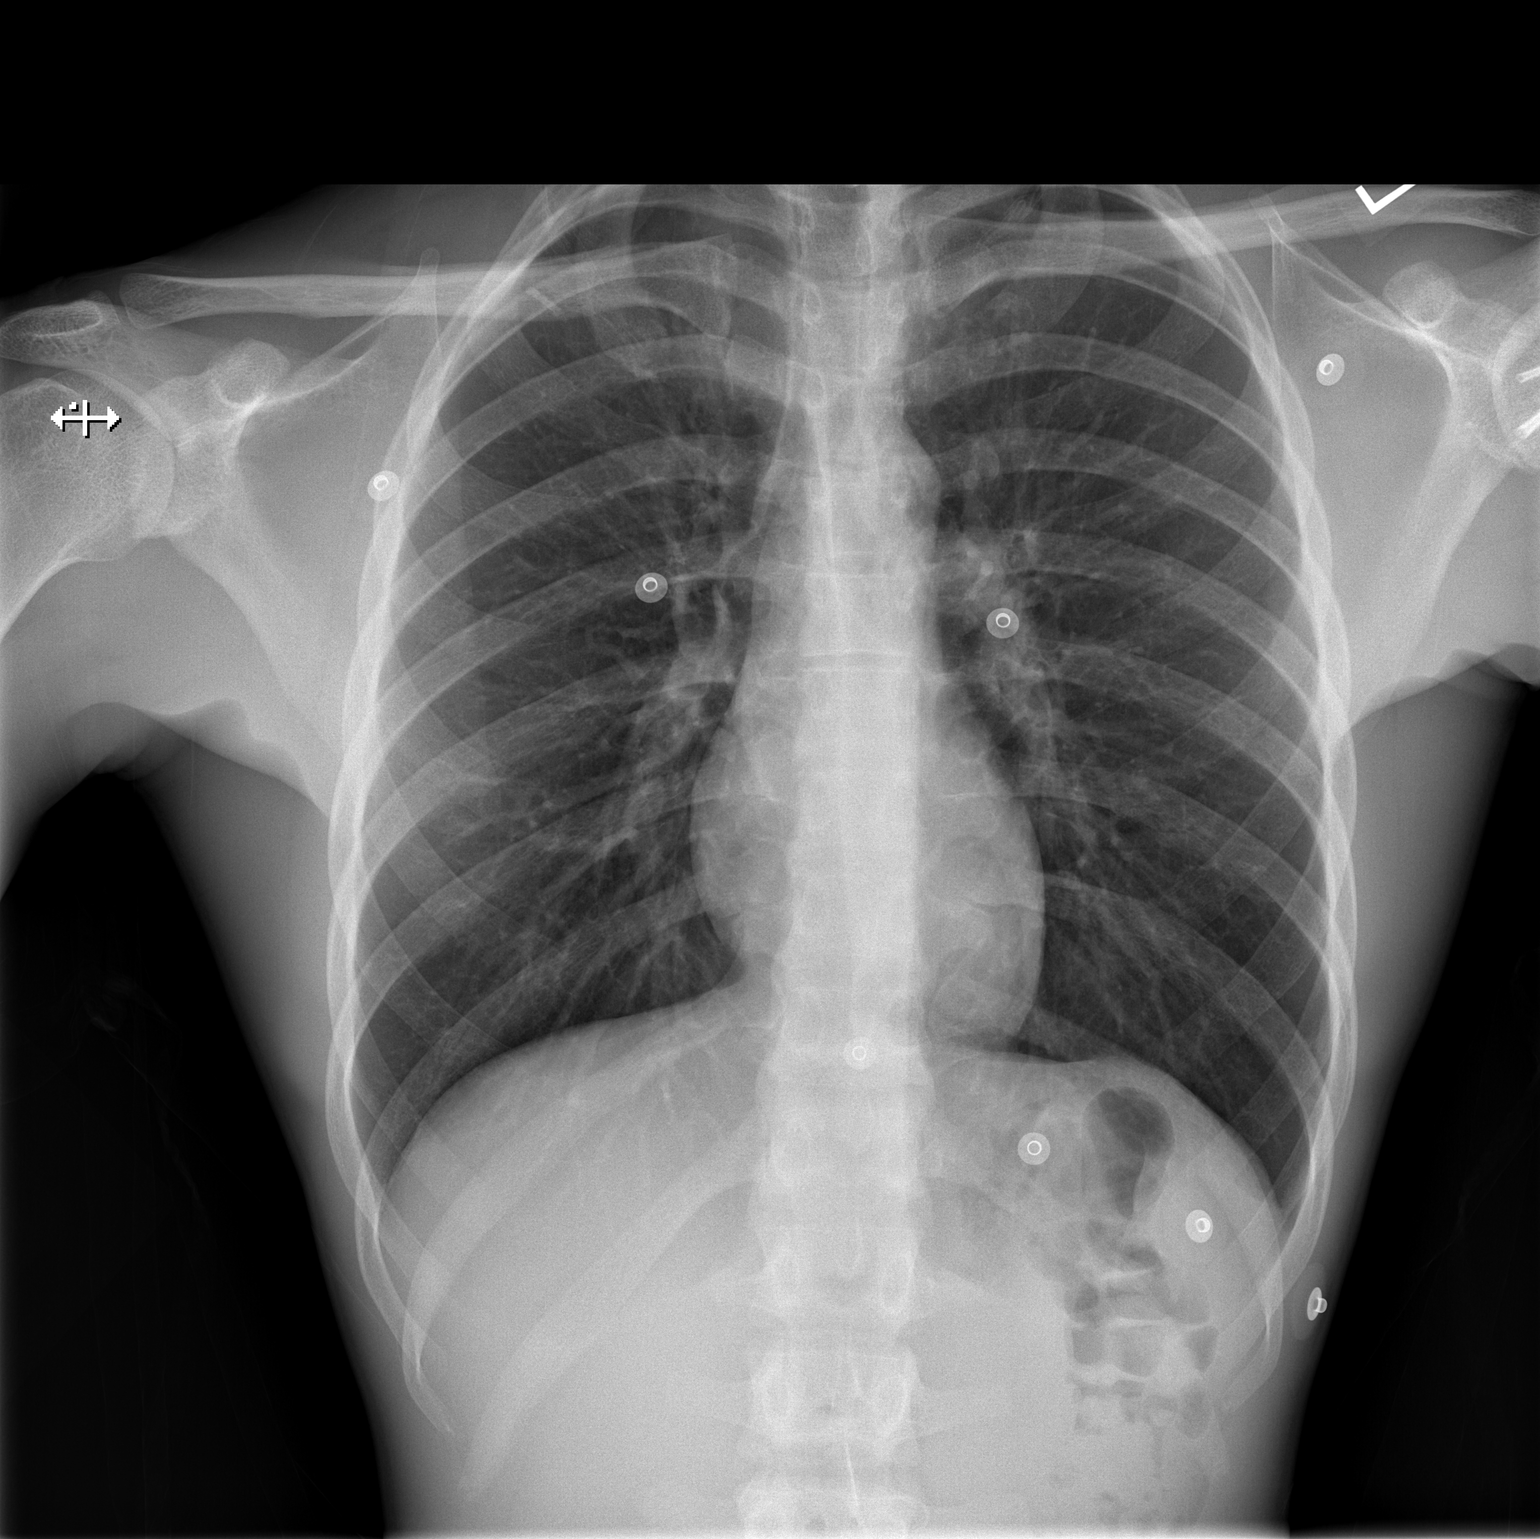

[w chest lat]
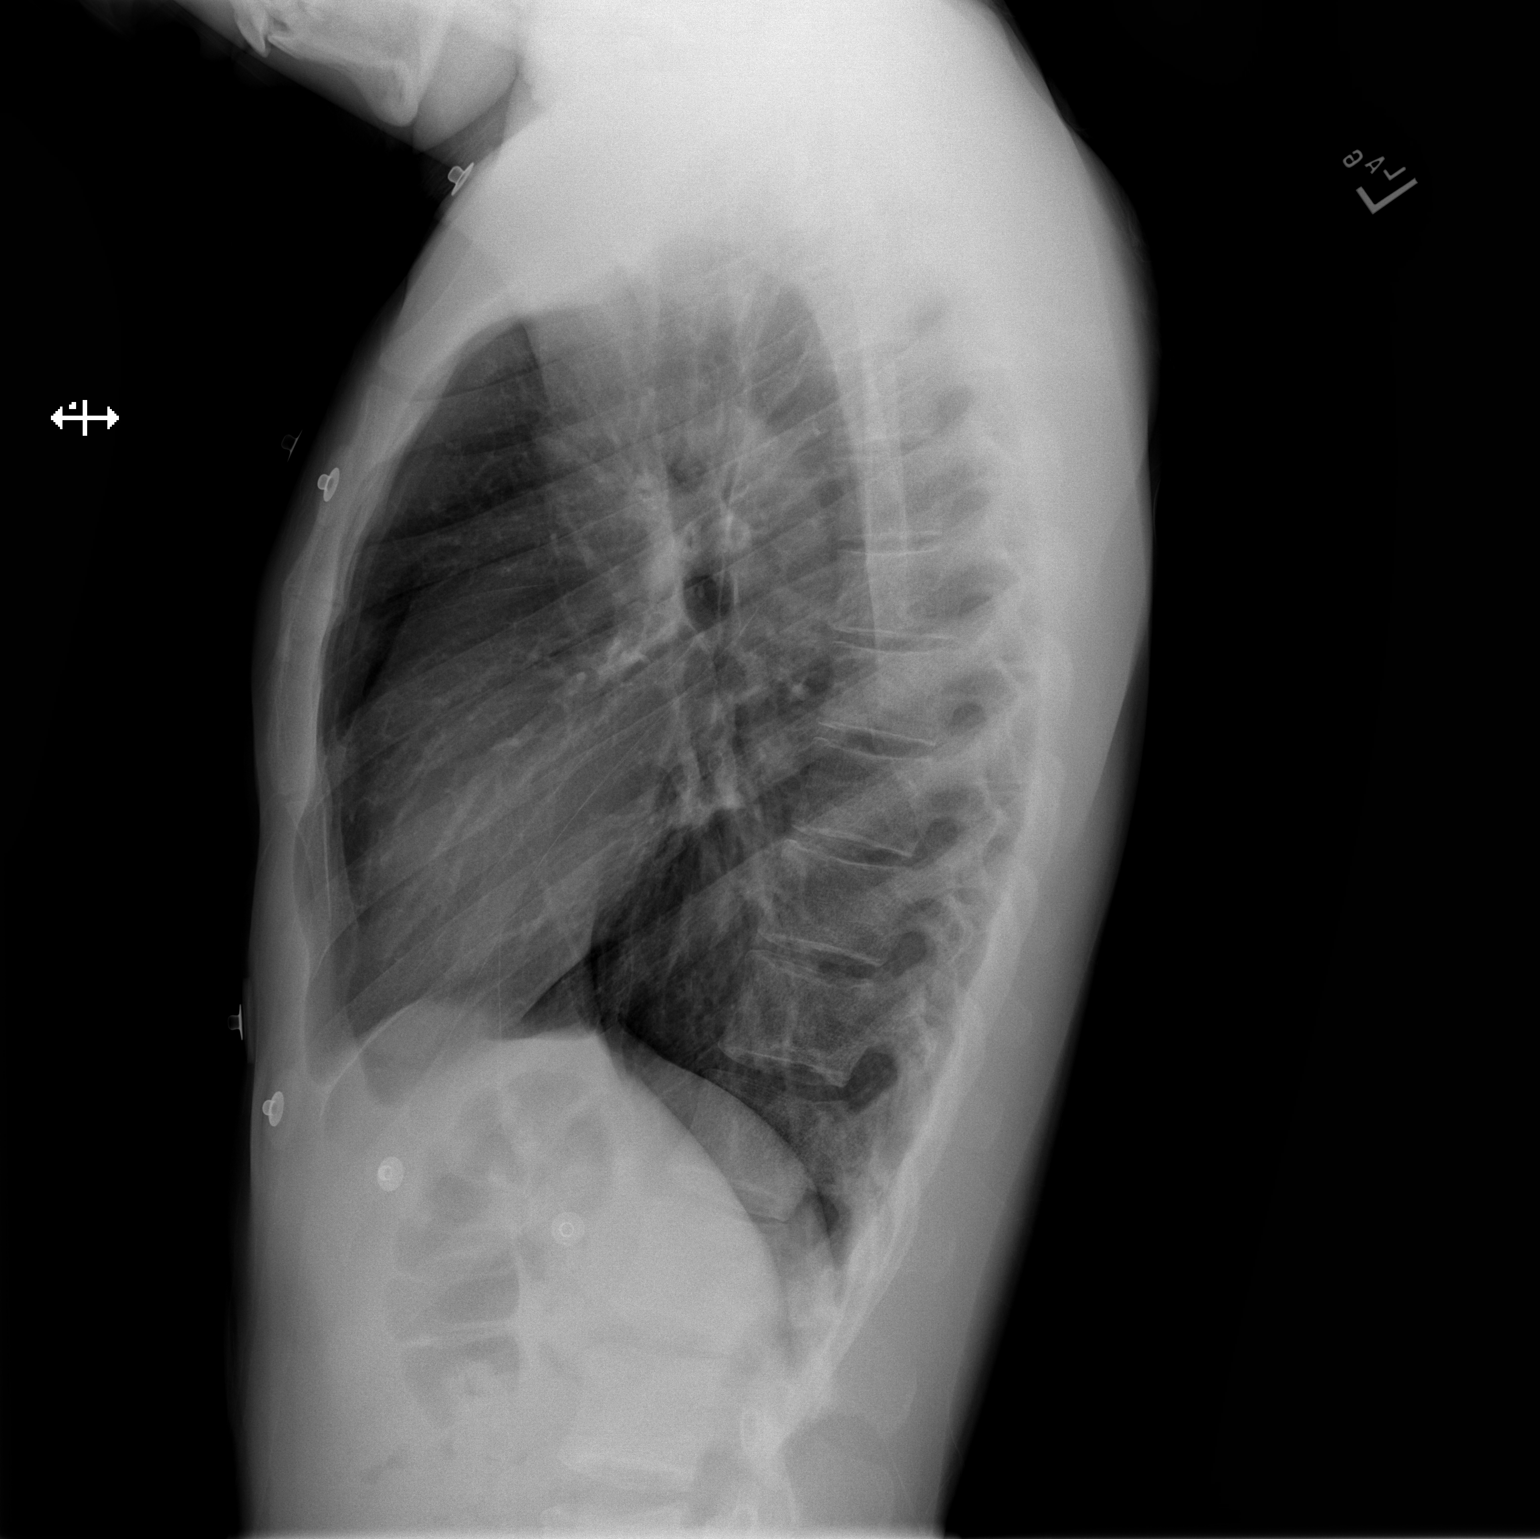

[2 of 2 positions shown; findings below may reference images not displayed]

FINDINGS: The heart size and mediastinal contours are within normal limits.
Both lungs are clear. The visualized skeletal structures are
unremarkable.
IMPRESSION: No active cardiopulmonary disease.

## 2021-04-10 NOTE — ED Provider Notes (Signed)
Emergency Medicine Provider Triage Evaluation Note  Terrence Riley , a 20 y.o. male  was evaluated in triage.  Pt complains of chest pain x 1700 today. Constant, anterior chest, worse with twisting/turning motions and deep breath. Also had mild headache.   Review of Systems  Positive: Chest pain, headache Negative: N/V, hemoptysis, syncope.   Physical Exam  BP (!) 128/93 (BP Location: Right Arm)   Pulse 62   Temp 98.1 F (36.7 C) (Oral)   Resp 18   Ht 6' (1.829 m)   Wt 59 kg   SpO2 100%   BMI 17.63 kg/m  Gen:   Awake, no distress   Resp:  Normal effort  MSK:   Moves extremities without difficulty  Other:  Anterior chest wall TTP.   Medical Decision Making  Medically screening exam initiated at 11:50 PM.  Appropriate orders placed.  Terrence Riley was informed that the remainder of the evaluation will be completed by another provider, this initial triage assessment does not replace that evaluation, and the importance of remaining in the ED until their evaluation is complete.  Chest pain.    Leafy Kindle 04/10/21 2351    Ripley Fraise, MD 04/11/21 [phone]

## 2021-04-10 NOTE — ED Triage Notes (Signed)
Pt states he was at the store with his mom when he experienced sudden onset of left sided chest pain that radiated to the right side of his chest and caused light-headedness and HA. Pt denies cardiac history and states this has never happened before. Pt describes the pain as sharp pain and tightness and gets worse when he takes a deep breath stating pain is 7/10. Pt endorses taking TUMS to see if that relieved the pain. Pt reports no improvement. Pt is A&Ox4.

## 2021-04-14 ENCOUNTER — Ambulatory Visit: Payer: No Typology Code available for payment source | Admitting: Family Medicine

## 2021-04-16 ENCOUNTER — Ambulatory Visit (INDEPENDENT_AMBULATORY_CARE_PROVIDER_SITE_OTHER): Payer: No Typology Code available for payment source | Admitting: Neurology

## 2021-04-16 ENCOUNTER — Encounter: Payer: Self-pay | Admitting: Neurology

## 2021-04-16 ENCOUNTER — Other Ambulatory Visit: Payer: Self-pay

## 2021-04-16 ENCOUNTER — Other Ambulatory Visit: Payer: Self-pay | Admitting: Neurology

## 2021-04-16 VITALS — BP 121/67 | HR 54 | Ht 72.0 in | Wt 126.0 lb

## 2021-04-16 DIAGNOSIS — G40209 Localization-related (focal) (partial) symptomatic epilepsy and epileptic syndromes with complex partial seizures, not intractable, without status epilepticus: Secondary | ICD-10-CM

## 2021-04-16 DIAGNOSIS — F445 Conversion disorder with seizures or convulsions: Secondary | ICD-10-CM | POA: Diagnosis not present

## 2021-04-16 MED ORDER — LEVETIRACETAM 1000 MG PO TABS: ORAL_TABLET | ORAL | 3 refills | Status: DC

## 2021-04-16 MED ORDER — CLONAZEPAM 1 MG PO TABS: 1.0000 mg | ORAL_TABLET | Freq: Two times a day (BID) | ORAL | 5 refills | Status: DC

## 2021-04-16 MED ORDER — TOPIRAMATE 200 MG PO TABS: ORAL_TABLET | ORAL | 3 refills | Status: DC

## 2021-04-16 NOTE — Patient Instructions (Signed)
Bloodwork for Keppra and Topamax levels  2. Schedule 1-hour EEG  3. Continue Keppra 1000mg : take 2 tablets every night, Topiramate 200mg : take 1 tablet every night, and clonazepam 1mg  twice a day.  If the levels of medicine are low, we will call you to increase the dose of medication.  4. As per  driving laws, no driving until 6 months seizure-free  5. Follow-up in 4-5 months, call for any changes   Seizure Precautions: 1. If medication has been prescribed for you to prevent seizures, take it exactly as directed.  Do not stop taking the medicine without talking to your doctor first, even if you have not had a seizure in a long time.   2. Avoid activities in which a seizure would cause danger to yourself or to others.  Don't operate dangerous machinery, swim alone, or climb in high or dangerous places, such as on ladders, roofs, or girders.  Do not drive unless your doctor says you may.  3. If you have any warning that you may have a seizure, lay down in a safe place where you can't hurt yourself.    4.  No driving for 6 months from last seizure, as per [facility].   Please refer to the following link on the [place] website for more information: http://[email or web address]/[email or web address]/driving/drivingu.cfm   5.  Maintain good sleep hygiene. Avoid alcohol.  6.  Contact your doctor if you have any problems that may be related to the medicine you are taking.  7.  Call 911 and bring the patient back to the ED if:        A.  The seizure lasts longer than 5 minutes.       B.  The patient doesn't awaken shortly after the seizure  C.  The patient has new problems such as difficulty seeing, speaking or moving  D.  The patient was injured during the seizure  E.  The patient has a temperature over 102 F (39C)  F.  The patient vomited and now is having trouble breathing

## 2021-04-16 NOTE — Progress Notes (Signed)
NEUROLOGY CONSULTATION NOTE  Terrence Riley MRN: [id number] DOB: 07-11-2001  Referring provider: Dr. Carolann Littler Primary care provider: Dr. Carolann Littler  Reason for consult:  establish care for seizures  Dear Dr Elease Hashimoto:  Thank you for your kind referral of Terrence Riley for consultation of the above symptoms. Although his history is well known to you, please allow me to reiterate it for the purpose of our medical record. He is alone in the office today. Records and images were personally reviewed where available.   HISTORY OF PRESENT ILLNESS: This is a 20 year old right-handed man with a history of co-existing epilepsy and psychogenic non-epileptic events, depression, presenting to establish care. He is a poor historian, records from his neurologist at [facility] and ER visits were reviewed. He states seizures started when he was a baby, notes indicate seizure started in infancy. He does not recall when his last seizure was, he does not remember being in the ER 3 months ago for seizure. This year, he was in the ER in 08/2020 for a nocturnal seizure where he sustained a left eyelid laceration. He admitted to missing 3 doses of Keppra. He was in the ER on 01/14/21 for dizziness/chest burning, when he had a witnessed episode of eye twitching, unresponsive with barely perceptible bilateral upper extremity twitching that lasted at least 20 minutes, given 3mg  IV Ativan and IV Keppra. He was back in the ER on 01/16/21 when he [place] lasting 2 minutes, post-ictal on EMS arrival. He states he gets lightheaded and cannot hear, then wakes up in the ambulance. He feels weird and sore, he denies any tongue bite or incontinence. He denies any staring episodes, however prior notes from [facility] indicate seizures where he is staring, unresponsive with right face, arm, leg jerking. He also has a history of seizures with face contortion, stiffening, head jerks to the left. One time he  spun in a complete circle then crumpled to the floor with jerking, head turned to the left. Brain MRI in 2011 was normal. He had a 24-hour EEG in 11/2012 where push button episodes for reduced responses showed normal EEG. Baseline EEG was abnormal due to frequent left frontal/temporal delta slowing and occasional independent right temporal delta slowing, no epileptiform discharges. He had an EMU admission for 3 days (07/26/13 to 07/29/13) where episode of staring spell felt likely non-epileptic. Baseline EEG was abnormal with "increased epileptogenic potential in the right central/parietal area, increased epileptogenic potential with a generalized mechanism of onset." He is listed as taking Topiramate 200mg  BID but only takes 200mg  qhs, Levetiracetam 1000mg  BID (taking 2000mg  qhs), and clonazepam 1mg  BID. Per notes, clonazepam 2 pills daily was started by his pediatric neurologist "meant to be a bridge according to notes dating back to 2014." He denies missing medications, and denies any side effects. He has not noticed any triggers to his seizures. He denies any olfactory/gustatory hallucinations, deja vu, rising epigastric sensation, focal numbness/tingling/weakness, myoclonic jerks. He has neck pain and right shoulder pain with shoulder surgery scheduled next month. He denies any headaches, dizziness, vision changes, bowel/bladder dysfunction, no falls. He lives with his mother who has not mentioned staring spells recently. He usually gets 4-5 hours of sleep and takes naps. 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 Memory is good. Mood is fine.   Epilepsy Risk Factors:  He was born premature. His mother is treated with seizure medication but unclear if she has  epilepsy. He had a normal birth and early development.  There is no history of febrile convulsions, CNS infections such as meningitis/encephalitis, significant traumatic brain injury, neurosurgical procedures.  Prior ASMs: Keppra, Trileptal,  Tegretol, Carbatrol; incomplete control on monotherapy    PAST MEDICAL HISTORY: Past Medical History:  Diagnosis Date   Allergy    rhinitis   Asthma    as a child   Complication of anesthesia    pt. reports he need more anesthesia with the septoplasty surgery   Eosinophilic esophagitis    heartburn and reflux   Family history of adverse reaction to anesthesia    mother respiratory failure   Nasal fracture    deviated septum   Pneumonia    1x history of   Premature baby    2 months early   Seizures ([place])    well controlled on meds/ one 2 months ago when meds were late    PAST SURGICAL HISTORY: Past Surgical History:  Procedure Laterality Date   ADENOIDECTOMY     CLOSED REDUCTION NASAL FRACTURE N/A 08/31/2017   Procedure: CLOSED REDUCTION NASAL FRACTURE;  Surgeon: Clyde Canterbury, MD;  Location: [place];  Service: ENT;  Laterality: N/A;   SEPTOPLASTY N/A 08/31/2017   Procedure: SEPTOPLASTY;  Surgeon: Clyde Canterbury, MD;  Location: [place];  Service: ENT;  Laterality: N/A;   SHOULDER ARTHROSCOPY WITH LABRAL REPAIR Left 04/30/2020   Procedure: LEFT SHOULDER POSTERIOR LABRAL REPAIR WITH ARTHROSCOPY, BICEPS TENDON RELEASE AND TENODESIS, OPEN ALLOGRAFT FOR REVERSE BANKART LESION;  Surgeon: [name], MD;  Location: [place];  Service: Orthopedics;  Laterality: Left;   TONSILLECTOMY     and addenoids    MEDICATIONS: Current Outpatient Medications on File Prior to Visit  Medication Sig Dispense Refill   clonazePAM (KLONOPIN) 1 MG tablet Take 1 tablet by mouth 2 (two) times daily.     EPINEPHrine 0.3 mg/0.3 mL IJ SOAJ injection Inject 0.3 mg into the muscle as needed for anaphylaxis. 1 each 0   levETIRAcetam (KEPPRA) 1000 MG tablet Take 1 tablet (1,000 mg total) by mouth 2 (two) times daily. 60 tablet 0   lidocaine (LIDODERM) 5 % Place 1 patch onto the skin daily. Remove & Discard patch within 12 hours or as directed by MD (Patient taking differently: Place  1 patch onto the skin daily as needed (pain). Remove & Discard patch within 12 hours or as directed by MD) 30 patch 0   topiramate (TOPAMAX) 200 MG tablet Take 1 tablet (200 mg total) by mouth 2 (two) times daily. 60 tablet 0   No current facility-administered medications on file prior to visit.    ALLERGIES: Allergies  Allergen Reactions   Lortab [Hydrocodone-Acetaminophen] Hives   Other Anaphylaxis    Peanuts   Zofran [Ondansetron Hcl] Nausea And Vomiting   Citrus Other (See Comments)    Sores in mouth   Dairy Aid [Lactase] Nausea And Vomiting   Influenza Virus Vaccine Other (See Comments)   Soy Allergy Other (See Comments)    Seizures    Tylenol [Acetaminophen]     Can cause a fever and seizure activity    Eggs Or Egg-Derived Products Rash    Pt mother reports pt only avoids eggs alone but can tolerate as an ingredient. [facility] 07/27/13   Hydrocodone-Acetaminophen Rash    FAMILY HISTORY: Family History  Problem Relation Age of Onset   Cancer Mother        breat   Protein S deficiency Mother  SOCIAL HISTORY: Social History   Socioeconomic History   Marital status: Single    Spouse name: Not on file   Number of children: Not on file   Years of education: Not on file   Highest education level: Not on file  Occupational History   Not on file  Tobacco Use   Smoking status: Never   Smokeless tobacco: Never  Vaping Use   Vaping Use: Never used  Substance and Sexual Activity   Alcohol use: No   Drug use: No   Sexual activity: Not on file  Other Topics Concern   Not on file  Social History Narrative   Not on file   Social Determinants of Health   Financial Resource Strain: Not on file  Food Insecurity: Not on file  Transportation Needs: Not on file  Physical Activity: Not on file  Stress: Not on file  Social Connections: Not on file  Intimate Partner Violence: Not on file     PHYSICAL EXAM: Vitals:   04/16/21 1257  BP: 121/67  Pulse: (!) 54  SpO2:  99%   General: No acute distress Head:  Normocephalic/atraumatic Skin/Extremities: No rash, no edema Neurological Exam: Mental status: alert and oriented to person, place, and time, no dysarthria or aphasia, Fund of knowledge is appropriate.  Recent and remote memory are impaired, 2/3 delayed recall.  Attention and concentration are normal, 5/5 WORLD backwards. Cranial nerves: CN I: not tested CN II: pupils equal, round and reactive to light, visual fields intact CN III, IV, VI:  full range of motion, no nystagmus, no ptosis CN V: facial sensation intact CN VII: upper and lower face symmetric CN VIII: hearing intact to conversation Bulk & Tone: normal, no fasciculations. Motor: 5/5 throughout with no pronator drift. Sensation: intact to light touch, cold, vibration sense.  No extinction to double simultaneous stimulation.  Romberg test negative Deep Tendon Reflexes: +1 throughout Cerebellar: no incoordination on finger to nose testing Gait: narrow-based and steady, able to tandem walk adequately. Tremor: none   IMPRESSION: This is a 20 year old right-handed man with a history of co-existing epilepsy and psychogenic non-epileptic events, depression, presenting to establish care. Prior EEG reported bilateral temporal slowing, epileptogenic potential in the right central/parietal area, and generalized mechanism. EEGs in the past showed normal EEG during episodes of staring/decreased responsiveness. MRI brain normal. He is a poor historian and does not recall most recent seizure on 01/16/21 (GTC). He denies missing medications however compliance issues have been raised in the past. Check Levetiracetam and Topiramate levels today. He is supposed to be taking medications BID but only takes them qhs, further instructions once levels come back. Discussed [place] driving laws to stop driving until 6 months seizure-free. Follow-up in 4-5 months, he knows to call for any changes.    Thank you for allowing  me to participate in the care of this patient. Please do not hesitate to call for any questions or concerns.   Ellouise Newer, M.D.  CC: Dr. Elease Hashimoto

## 2021-04-17 ENCOUNTER — Ambulatory Visit (INDEPENDENT_AMBULATORY_CARE_PROVIDER_SITE_OTHER): Payer: No Typology Code available for payment source | Admitting: Neurology

## 2021-04-17 DIAGNOSIS — G40209 Localization-related (focal) (partial) symptomatic epilepsy and epileptic syndromes with complex partial seizures, not intractable, without status epilepticus: Secondary | ICD-10-CM | POA: Diagnosis not present

## 2021-04-17 DIAGNOSIS — F445 Conversion disorder with seizures or convulsions: Secondary | ICD-10-CM

## 2021-04-17 NOTE — Procedures (Signed)
ELECTROENCEPHALOGRAM REPORT  Date of Study: 04/17/2021  Patient's Name: Terrence Riley MRN: [id number] Date of Birth: 02/12/2001  Referring Provider: Dr. Ellouise Newer  Clinical History: This is a 20 year old man with a history of co-existing epilepsy and non-epileptic events with recurrent seizures on 3 seizure medications. EEG for classification.  Medications: KEPPRA 1000 MG tablet TOPAMAX 200 MG tablet KLONOPIN 1 MG tablet EPINEPHrine 0.3 mg/0.3 mL IJ SOAJ injection LIDODERM 5 % patch  Technical Summary: A multichannel digital 1-hour EEG recording measured by the international 10-20 system with electrodes applied with paste and impedances below 5000 ohms performed in our laboratory with EKG monitoring in an awake and asleep patient.  Hyperventilation was not performed. Photic stimulation was performed.  The digital EEG was referentially recorded, reformatted, and digitally filtered in a variety of bipolar and referential montages for optimal display.    Description: The patient is awake and asleep during the recording.  During maximal wakefulness, there is a symmetric, medium voltage 10.5 Hz posterior dominant rhythm that attenuates with eye opening.  The record is symmetric.  There is an excess amount of diffuse low voltage beta activity seen throughout the recording. During drowsiness and sleep, there is an increase in theta slowing of the background. Vertex waves and symmetric sleep spindles were seen.  Photic stimulation did not elicit any abnormalities.  There were no epileptiform discharges or electrographic seizures seen.    EKG lead was unremarkable.  Impression: This 1-hour awake and asleep EEG is normal except for excess amount of diffuse low voltage beta activity.  Clinical Correlation: Diffuse low voltage beta activity is commonly seen with sedating medications such as benzodiazepines.  In the absence of sedating medications, anxiety and hyperthyroidism may produce  generalized beta activity.  The absence of epileptiform discharges does not exclude a clinical diagnosis of epilepsy.  If further clinical questions remain, prolonged EEG may be helpful.  Clinical correlation is advised.   Ellouise Newer, M.D.

## 2021-04-18 ENCOUNTER — Telehealth: Payer: Self-pay

## 2021-04-18 MED ORDER — KEPPRA 1000 MG PO TABS: ORAL_TABLET | ORAL | 11 refills | Status: DC

## 2021-04-18 NOTE — Telephone Encounter (Signed)
Pt called to go over EEG results no answer left a voice mail to call office back

## 2021-04-18 NOTE — Telephone Encounter (Signed)
-----   Message from Cameron Sprang, MD sent at 04/18/2021 10:59 AM EDT ----- Pls let him know that the EEG is normal, however it is not like a pregnancy test that is positive or negative, just a snapshot of his brain waves. Continue all medications as instructed until we get his blood levels. We may change dose depending on blood work results. Thanks

## 2021-04-21 ENCOUNTER — Ambulatory Visit: Payer: No Typology Code available for payment source | Admitting: Neurology

## 2021-04-24 NOTE — Progress Notes (Addendum)
Surgical Instructions    Your procedure is scheduled on Monday October 10th.  Report to [facility] Main Entrance "A" at 5:30 A.M., then check in with the Admitting office.  Call this number if you have problems the morning of surgery:  [phone]   If you have any questions prior to your surgery date call [phone]: Open Monday-Friday 8am-4pm    Remember:  Do not eat after midnight the night before your surgery  You may drink clear liquids until 4:30am the morning of your surgery.   Clear liquids allowed are: Water, Non-Citrus Juices (without pulp), Carbonated Beverages, Clear Tea, Black Coffee ONLY (NO MILK, CREAM OR POWDERED CREAMER of any kind), and Gatorade   Enhanced Recovery after Surgery for Orthopedics Enhanced Recovery after Surgery is a protocol used to improve the stress on your body and your recovery after surgery.  Patient Instructions  The day of surgery (if you do NOT have diabetes):  Drink ONE (1) Pre-Surgery Clear Ensure by __4:30___ am the morning of surgery   This drink was given to you during your hospital  pre-op appointment visit. Nothing else to drink after completing the  Pre-Surgery Clear Ensure.           If you have questions, please contact your surgeon's office.     Take these medicines the morning of surgery with A SIP OF WATER clonazePAM (KLONOPIN) 1 MG tablet   IF NEEDED EPINEPHrine 0.3 mg/0.3 mL IJ SOAJ injection     Notify your provider: if you are in close contact with someone who has COVID  or if you develop a fever of 100.4 or greater, sneezing, cough, sore throat, shortness of breath or body aches.    As of today, STOP taking any Aspirin (unless otherwise instructed by your surgeon) Aleve, Naproxen, Ibuprofen, Motrin, Advil, Goody's, BC's, all herbal medications, fish oil, and all vitamins.          Do not wear jewelry or makeup Do not wear lotions, powders, perfumes/colognes, or  deodorant. Do not shave 48 hours prior to surgery.  Men may shave face and neck. Do not bring valuables to the hospital. DO Not wear nail polish, gel polish, artificial nails, or any other type of covering on natural nails including finger and toenails. If patients have artificial nails, gel coating, etc. that need to be removed by a nail salon please have this removed prior to surgery or surgery may need to be canceled/delayed if the surgeon/ anesthesia feels like the patient is unable to be adequately monitored.             [place] is not responsible for any belongings or valuables.  Do NOT Smoke (Tobacco/Vaping)  24 hours prior to your procedure If you use a CPAP at night, you may bring your mask for your overnight stay.   Contacts, glasses, dentures or bridgework may not be worn into surgery, please bring cases for these belongings   For patients admitted to the hospital, discharge time will be determined by your treatment team.   Patients discharged the day of surgery will not be allowed to drive home, and someone needs to stay with them for 24 hours.  NO VISITORS WILL BE ALLOWED IN PRE-OP WHERE PATIENTS ARE PREPPED FOR SURGERY.  ONLY 1 SUPPORT PERSON MAY BE PRESENT IN THE WAITING ROOM WHILE YOU ARE IN SURGERY.  IF YOU ARE TO BE ADMITTED, ONCE YOU ARE IN YOUR ROOM YOU WILL BE ALLOWED TWO (2) VISITORS. 1 (ONE)  VISITOR MAY STAY OVERNIGHT BUT MUST ARRIVE TO THE ROOM BY 8pm.  Minor children may have two parents present. Special consideration for safety and communication needs will be reviewed on a case by case basis.  Special instructions:    Oral Hygiene is also important to reduce your risk of infection.  Remember - BRUSH YOUR TEETH THE MORNING OF SURGERY WITH YOUR REGULAR TOOTHPASTE   [facility]- Preparing For Surgery  Before surgery, you can play an important role. Because skin is not sterile, your skin needs to be as free of germs as possible. You can reduce the number of germs  on your skin by washing with CHG (chlorahexidine gluconate) Soap before surgery.  CHG is an antiseptic cleaner which kills germs and bonds with the skin to continue killing germs even after washing.     Please do not use if you have an allergy to CHG or antibacterial soaps. If your skin becomes reddened/irritated stop using the CHG.  Do not shave (including legs and underarms) for at least 48 hours prior to first CHG shower. It is OK to shave your face.  Please follow these instructions carefully.     Shower the NIGHT BEFORE SURGERY and the MORNING OF SURGERY with CHG Soap.   If you chose to wash your hair, wash your hair first as usual with your normal shampoo. After you shampoo, rinse your hair and body thoroughly to remove the shampoo.  Then [employer] and genitals (private parts) with your normal soap and rinse thoroughly to remove soap.  After that Use CHG Soap as you would any other liquid soap. You can apply CHG directly to the skin and wash gently with a scrungie or a clean washcloth.   Apply the CHG Soap to your body ONLY FROM THE NECK DOWN.  Do not use on open wounds or open sores. Avoid contact with your eyes, ears, mouth and genitals (private parts). Wash Face and genitals (private parts)  with your normal soap.   Wash thoroughly, paying special attention to the area where your surgery will be performed.  Thoroughly rinse your body with warm water from the neck down.  DO NOT shower/wash with your normal soap after using and rinsing off the CHG Soap.  Pat yourself dry with a CLEAN TOWEL.  Wear CLEAN PAJAMAS to bed the night before surgery  Place CLEAN SHEETS on your bed the night before your surgery  DO NOT SLEEP WITH PETS.   Day of Surgery:  Take a shower with CHG soap. Wear Clean/Comfortable clothing the morning of surgery Do not apply any deodorants/lotions.   Remember to brush your teeth WITH YOUR REGULAR TOOTHPASTE.   Please read over the following fact sheets  that you were given.     [facility]- Preparing for Total Shoulder Arthroplasty   Before surgery, you can play an important role. Because skin is not sterile, your skin needs to be as free of germs as possible. You can reduce the number of germs on your skin by using the following products. Benzoyl Peroxide Gel Reduces the number of germs present on the skin Applied twice a day to shoulder area starting two days before surgery   Chlorhexidine Gluconate (CHG) Soap An antiseptic cleaner that kills germs and bonds with the skin to continue killing germs even after washing Used for showering the night before surgery and morning of surgery   Oral Hygiene is also important to reduce your risk of infection.  Remember - BRUSH YOUR TEETH THE MORNING OF SURGERY WITH YOUR REGULAR TOOTHPASTE  ==================================================================  Please follow these instructions carefully:  BENZOYL PEROXIDE 5% GEL  Please do not use if you have an allergy to benzoyl peroxide.   If your skin becomes reddened/irritated stop using the benzoyl peroxide.  Starting two days before surgery, apply as follows: Apply benzoyl peroxide in the morning and at night. Apply after taking a shower. If you are not taking a shower clean entire shoulder front, back, and side along with the armpit with a clean wet washcloth.  Place a quarter-sized dollop on your shoulder and rub in thoroughly, making sure to cover the front, back, and side of your shoulder, along with the armpit.   2 days before ____ AM   ____ PM              1 day before ____ AM   ____ PM                             Do this twice a day for two days.  (Last application is the night before surgery, AFTER using the CHG soap as described below).  Do NOT apply benzoyl peroxide gel on the day of surgery.

## 2021-04-25 ENCOUNTER — Encounter (HOSPITAL_COMMUNITY)
Admission: RE | Admit: 2021-04-25 | Discharge: 2021-04-25 | Disposition: A | Discharge status: Home / Self Care | Payer: No Typology Code available for payment source | Source: Ambulatory Visit | Attending: Orthopedic Surgery | Admitting: Orthopedic Surgery

## 2021-04-25 ENCOUNTER — Other Ambulatory Visit: Payer: Self-pay

## 2021-04-25 ENCOUNTER — Encounter (HOSPITAL_COMMUNITY): Payer: Self-pay

## 2021-04-25 DIAGNOSIS — Z01812 Encounter for preprocedural laboratory examination: Secondary | ICD-10-CM | POA: Insufficient documentation

## 2021-04-25 HISTORY — DX: Headache, unspecified: R51.9

## 2021-04-25 LAB — URINALYSIS, ROUTINE W REFLEX MICROSCOPIC
Bilirubin Urine: NEGATIVE
Glucose, UA: NEGATIVE mg/dL
Hgb urine dipstick: NEGATIVE
Ketones, ur: NEGATIVE mg/dL
Leukocytes,Ua: NEGATIVE
Nitrite: NEGATIVE
Protein, ur: NEGATIVE mg/dL
Specific Gravity, Urine: 1.011 (ref 1.005–1.030)
pH: 7 (ref 5.0–8.0)

## 2021-04-25 LAB — BASIC METABOLIC PANEL
Anion gap: 9 (ref 5–15)
BUN: 8 mg/dL (ref 6–20)
CO2: 23 mmol/L (ref 22–32)
Calcium: 9 mg/dL (ref 8.9–10.3)
Chloride: 105 mmol/L (ref 98–111)
Creatinine, Ser: 1.15 mg/dL (ref 0.61–1.24)
GFR, Estimated: >60 mL/min (ref >60)
Glucose, Bld: 67 mg/dL — ABNORMAL LOW (ref 70–99)
Potassium: 3.3 mmol/L — ABNORMAL LOW (ref 3.5–5.1)
Sodium: 137 mmol/L (ref 135–145)

## 2021-04-25 LAB — CBC
HCT: 44.2 % (ref 39.0–52.0)
Hemoglobin: 15.2 g/dL (ref 13.0–17.0)
MCH: 29.5 pg (ref 26.0–34.0)
MCHC: 34.4 g/dL (ref 30.0–36.0)
MCV: 85.7 fL (ref 80.0–100.0)
Platelets: 149 10*3/uL — ABNORMAL LOW (ref 150–400)
RBC: 5.16 MIL/uL (ref 4.22–5.81)
RDW: 12.2 % (ref 11.5–15.5)
WBC: 3.7 10*3/uL — ABNORMAL LOW (ref 4.0–10.5)
nRBC: 0 % (ref 0.0–0.2)

## 2021-04-25 NOTE — Progress Notes (Signed)
PCP - Dr. Darnell Level Burchette  Chest x-ray - 04/10/21 EKG - 04/11/21  Dm - Denies  ERAS Protcol - Yes PRE-SURGERY Ensure    Anesthesia review:   Patient denies shortness of breath, fever, cough and chest pain at PAT appointment   All instructions explained to the patient, with a verbal understanding of the material. Patient agrees to go over the instructions while at home for a better understanding.  The opportunity to ask questions was provided.

## 2021-04-28 ENCOUNTER — Ambulatory Visit: Payer: No Typology Code available for payment source | Admitting: Neurology

## 2021-04-28 LAB — NASOPHARYNGEAL CULTURE: Special Requests: NORMAL

## 2021-05-04 ENCOUNTER — Encounter (HOSPITAL_COMMUNITY): Payer: Self-pay | Admitting: Orthopedic Surgery

## 2021-05-05 ENCOUNTER — Ambulatory Visit (HOSPITAL_COMMUNITY): Payer: No Typology Code available for payment source | Admitting: Anesthesiology

## 2021-05-05 ENCOUNTER — Other Ambulatory Visit: Payer: Self-pay

## 2021-05-05 ENCOUNTER — Other Ambulatory Visit: Payer: Self-pay | Admitting: Neurology

## 2021-05-05 ENCOUNTER — Ambulatory Visit (HOSPITAL_COMMUNITY)
Admission: RE | Admit: 2021-05-05 | Discharge: 2021-05-05 | Disposition: A | Discharge status: Home / Self Care | Payer: No Typology Code available for payment source | Attending: Orthopedic Surgery | Admitting: Orthopedic Surgery

## 2021-05-05 ENCOUNTER — Encounter (HOSPITAL_COMMUNITY): Payer: Self-pay | Admitting: Orthopedic Surgery

## 2021-05-05 ENCOUNTER — Encounter (HOSPITAL_COMMUNITY)
Admission: RE | Disposition: A | Discharge status: Home / Self Care | Payer: Self-pay | Source: Home / Self Care | Attending: Orthopedic Surgery

## 2021-05-05 ENCOUNTER — Other Ambulatory Visit (HOSPITAL_COMMUNITY): Payer: Self-pay

## 2021-05-05 DIAGNOSIS — Z885 Allergy status to narcotic agent status: Secondary | ICD-10-CM | POA: Diagnosis not present

## 2021-05-05 DIAGNOSIS — Z886 Allergy status to analgesic agent status: Secondary | ICD-10-CM | POA: Insufficient documentation

## 2021-05-05 DIAGNOSIS — K219 Gastro-esophageal reflux disease without esophagitis: Secondary | ICD-10-CM | POA: Insufficient documentation

## 2021-05-05 DIAGNOSIS — S42291P Other displaced fracture of upper end of right humerus, subsequent encounter for fracture with malunion: Secondary | ICD-10-CM | POA: Diagnosis not present

## 2021-05-05 DIAGNOSIS — Z888 Allergy status to other drugs, medicaments and biological substances status: Secondary | ICD-10-CM | POA: Insufficient documentation

## 2021-05-05 DIAGNOSIS — Z887 Allergy status to serum and vaccine status: Secondary | ICD-10-CM | POA: Insufficient documentation

## 2021-05-05 DIAGNOSIS — S43431D Superior glenoid labrum lesion of right shoulder, subsequent encounter: Secondary | ICD-10-CM | POA: Diagnosis not present

## 2021-05-05 DIAGNOSIS — M25311 Other instability, right shoulder: Secondary | ICD-10-CM | POA: Diagnosis not present

## 2021-05-05 DIAGNOSIS — S43431S Superior glenoid labrum lesion of right shoulder, sequela: Secondary | ICD-10-CM | POA: Diagnosis not present

## 2021-05-05 DIAGNOSIS — Z79899 Other long term (current) drug therapy: Secondary | ICD-10-CM | POA: Insufficient documentation

## 2021-05-05 DIAGNOSIS — S42291D Other displaced fracture of upper end of right humerus, subsequent encounter for fracture with routine healing: Secondary | ICD-10-CM | POA: Diagnosis not present

## 2021-05-05 HISTORY — PX: ORIF HUMERUS FRACTURE: SHX2126

## 2021-05-05 HISTORY — PX: SHOULDER ARTHROSCOPY WITH LABRAL REPAIR: SHX5691

## 2021-05-05 SURGERY — ARTHROSCOPY, SHOULDER, WITH GLENOID LABRUM REPAIR
Anesthesia: Regional | Site: Shoulder | Laterality: Right

## 2021-05-05 MED ORDER — DEXMEDETOMIDINE (PRECEDEX) IN NS 20 MCG/5ML (4 MCG/ML) IV SYRINGE
PREFILLED_SYRINGE | INTRAVENOUS | Status: AC
  Filled 2021-05-05: qty 10

## 2021-05-05 MED ORDER — PHENYLEPHRINE 40 MCG/ML (10ML) SYRINGE FOR IV PUSH (FOR BLOOD PRESSURE SUPPORT)
PREFILLED_SYRINGE | INTRAVENOUS | Status: AC
  Filled 2021-05-05: qty 10

## 2021-05-05 MED ORDER — LACTATED RINGERS IV SOLN: INTRAVENOUS | Status: DC | PRN

## 2021-05-05 MED ORDER — POVIDONE-IODINE 7.5 % EX SOLN: Freq: Once | CUTANEOUS | Status: DC

## 2021-05-05 MED ORDER — ROCURONIUM BROMIDE 10 MG/ML (PF) SYRINGE
PREFILLED_SYRINGE | INTRAVENOUS | Status: DC | PRN
  Administered 2021-05-05: 50 mg via INTRAVENOUS

## 2021-05-05 MED ORDER — FENTANYL CITRATE (PF) 250 MCG/5ML IJ SOLN
INTRAMUSCULAR | Status: DC | PRN
  Administered 2021-05-05 (×2): 50 ug via INTRAVENOUS

## 2021-05-05 MED ORDER — EPINEPHRINE PF 1 MG/ML IJ SOLN
INTRAMUSCULAR | Status: AC
  Filled 2021-05-05: qty 3

## 2021-05-05 MED ORDER — POVIDONE-IODINE 10 % EX SWAB
2.0000 "application " | Freq: Once | CUTANEOUS | Status: AC
  Administered 2021-05-05: 2 via TOPICAL

## 2021-05-05 MED ORDER — SODIUM CHLORIDE 0.9 % IR SOLN
Status: DC | PRN
  Administered 2021-05-05 (×8): 3000 mL

## 2021-05-05 MED ORDER — CHLORHEXIDINE GLUCONATE 0.12 % MT SOLN
15.0000 mL | Freq: Once | OROMUCOSAL | Status: AC
  Administered 2021-05-05: 15 mL via OROMUCOSAL
  Filled 2021-05-05: qty 15

## 2021-05-05 MED ORDER — CLONAZEPAM 1 MG PO TABS
1.0000 mg | ORAL_TABLET | Freq: Two times a day (BID) | ORAL | 5 refills | Status: DC
  Filled 2021-05-05: qty 60, 30d supply, fill #0

## 2021-05-05 MED ORDER — PROPOFOL 10 MG/ML IV BOLUS
INTRAVENOUS | Status: AC
  Filled 2021-05-05: qty 20

## 2021-05-05 MED ORDER — DIPHENHYDRAMINE HCL 50 MG/ML IJ SOLN
INTRAMUSCULAR | Status: DC | PRN
  Administered 2021-05-05: 12.5 mg via INTRAVENOUS

## 2021-05-05 MED ORDER — PHENYLEPHRINE 40 MCG/ML (10ML) SYRINGE FOR IV PUSH (FOR BLOOD PRESSURE SUPPORT)
PREFILLED_SYRINGE | INTRAVENOUS | Status: DC | PRN
  Administered 2021-05-05 (×3): 40 ug via INTRAVENOUS

## 2021-05-05 MED ORDER — DIPHENHYDRAMINE HCL 50 MG/ML IJ SOLN
INTRAMUSCULAR | Status: AC
  Filled 2021-05-05: qty 1

## 2021-05-05 MED ORDER — BUPIVACAINE HCL (PF) 0.25 % IJ SOLN
INTRAMUSCULAR | Status: AC
  Filled 2021-05-05: qty 20

## 2021-05-05 MED ORDER — DEXMEDETOMIDINE (PRECEDEX) IN NS 20 MCG/5ML (4 MCG/ML) IV SYRINGE
PREFILLED_SYRINGE | INTRAVENOUS | Status: DC | PRN
  Administered 2021-05-05: 12 ug via INTRAVENOUS
  Administered 2021-05-05: 8 ug via INTRAVENOUS

## 2021-05-05 MED ORDER — PROPOFOL 10 MG/ML IV BOLUS
INTRAVENOUS | Status: DC | PRN
  Administered 2021-05-05: 200 mg via INTRAVENOUS
  Administered 2021-05-05: 20 mg via INTRAVENOUS
  Administered 2021-05-05: 30 mg via INTRAVENOUS
  Administered 2021-05-05: 50 mg via INTRAVENOUS

## 2021-05-05 MED ORDER — EPINEPHRINE PF 1 MG/ML IJ SOLN
INTRAMUSCULAR | Status: DC | PRN
  Administered 2021-05-05: 4 mg via SUBCUTANEOUS

## 2021-05-05 MED ORDER — FENTANYL CITRATE (PF) 100 MCG/2ML IJ SOLN: 25.0000 ug | INTRAMUSCULAR | Status: DC | PRN

## 2021-05-05 MED ORDER — GLYCOPYRROLATE PF 0.2 MG/ML IJ SOSY
PREFILLED_SYRINGE | INTRAMUSCULAR | Status: DC | PRN
  Administered 2021-05-05: .2 mg via INTRAVENOUS

## 2021-05-05 MED ORDER — BUPIVACAINE HCL (PF) 0.5 % IJ SOLN
INTRAMUSCULAR | Status: DC | PRN
  Administered 2021-05-05: 15 mL via PERINEURAL

## 2021-05-05 MED ORDER — EPHEDRINE SULFATE-NACL 50-0.9 MG/10ML-% IV SOSY
PREFILLED_SYRINGE | INTRAVENOUS | Status: DC | PRN
  Administered 2021-05-05: 5 mg via INTRAVENOUS
  Administered 2021-05-05: 10 mg via INTRAVENOUS
  Administered 2021-05-05: 5 mg via INTRAVENOUS

## 2021-05-05 MED ORDER — ORAL CARE MOUTH RINSE: 15.0000 mL | Freq: Once | OROMUCOSAL | Status: AC

## 2021-05-05 MED ORDER — ROCURONIUM BROMIDE 10 MG/ML (PF) SYRINGE
PREFILLED_SYRINGE | INTRAVENOUS | Status: AC
  Filled 2021-05-05: qty 10

## 2021-05-05 MED ORDER — LACTATED RINGERS IV SOLN: INTRAVENOUS | Status: DC

## 2021-05-05 MED ORDER — FENTANYL CITRATE (PF) 250 MCG/5ML IJ SOLN
INTRAMUSCULAR | Status: AC
  Filled 2021-05-05: qty 5

## 2021-05-05 MED ORDER — DEXAMETHASONE SODIUM PHOSPHATE 10 MG/ML IJ SOLN
INTRAMUSCULAR | Status: AC
  Filled 2021-05-05: qty 1

## 2021-05-05 MED ORDER — CELECOXIB 100 MG PO CAPS: 100.0000 mg | ORAL_CAPSULE | Freq: Two times a day (BID) | ORAL | 0 refills | Status: DC

## 2021-05-05 MED ORDER — TRANEXAMIC ACID-NACL 1000-0.7 MG/100ML-% IV SOLN
1000.0000 mg | INTRAVENOUS | Status: AC
  Administered 2021-05-05: 1000 mg via INTRAVENOUS
  Filled 2021-05-05: qty 100

## 2021-05-05 MED ORDER — EPINEPHRINE PF 1 MG/ML IJ SOLN
INTRAMUSCULAR | Status: AC
  Filled 2021-05-05: qty 1

## 2021-05-05 MED ORDER — DEXAMETHASONE SODIUM PHOSPHATE 10 MG/ML IJ SOLN
INTRAMUSCULAR | Status: DC | PRN
  Administered 2021-05-05: 10 mg via INTRAVENOUS

## 2021-05-05 MED ORDER — OXYCODONE HCL 5 MG PO TABS: 5.0000 mg | ORAL_TABLET | ORAL | 0 refills | Status: DC | PRN

## 2021-05-05 MED ORDER — MIDAZOLAM HCL 2 MG/2ML IJ SOLN
INTRAMUSCULAR | Status: AC
  Filled 2021-05-05: qty 2

## 2021-05-05 MED ORDER — MORPHINE SULFATE (PF) 4 MG/ML IV SOLN
INTRAVENOUS | Status: AC
  Filled 2021-05-05: qty 2

## 2021-05-05 MED ORDER — PHENYLEPHRINE HCL-NACL 20-0.9 MG/250ML-% IV SOLN
INTRAVENOUS | Status: DC | PRN
  Administered 2021-05-05: 25 ug/min via INTRAVENOUS

## 2021-05-05 MED ORDER — LIDOCAINE 2% (20 MG/ML) 5 ML SYRINGE
INTRAMUSCULAR | Status: DC | PRN
  Administered 2021-05-05: 20 mg via INTRAVENOUS

## 2021-05-05 MED ORDER — METHOCARBAMOL 500 MG PO TABS: 500.0000 mg | ORAL_TABLET | Freq: Three times a day (TID) | ORAL | 0 refills | Status: DC | PRN

## 2021-05-05 MED ORDER — BUPIVACAINE LIPOSOME 1.3 % IJ SUSP
INTRAMUSCULAR | Status: DC | PRN
  Administered 2021-05-05: 10 mL via PERINEURAL

## 2021-05-05 MED ORDER — CLONIDINE HCL (ANALGESIA) 100 MCG/ML EP SOLN
EPIDURAL | Status: AC
  Filled 2021-05-05: qty 10

## 2021-05-05 MED ORDER — EPHEDRINE 5 MG/ML INJ
INTRAVENOUS | Status: AC
  Filled 2021-05-05: qty 5

## 2021-05-05 MED ORDER — MIDAZOLAM HCL 5 MG/5ML IJ SOLN
INTRAMUSCULAR | Status: DC | PRN
  Administered 2021-05-05: 2 mg via INTRAVENOUS

## 2021-05-05 MED ORDER — LIDOCAINE 2% (20 MG/ML) 5 ML SYRINGE
INTRAMUSCULAR | Status: AC
  Filled 2021-05-05: qty 5

## 2021-05-05 MED ORDER — CEFAZOLIN SODIUM-DEXTROSE 2-4 GM/100ML-% IV SOLN
2.0000 g | INTRAVENOUS | Status: AC
  Administered 2021-05-05 (×2): 2 g via INTRAVENOUS
  Filled 2021-05-05: qty 100

## 2021-05-05 MED ORDER — GLYCOPYRROLATE PF 0.2 MG/ML IJ SOSY
PREFILLED_SYRINGE | INTRAMUSCULAR | Status: AC
  Filled 2021-05-05: qty 1

## 2021-05-05 SURGICAL SUPPLY — 114 items
AID PSTN UNV HD RSTRNT DISP (MISCELLANEOUS) ×2
ALCOHOL 70% 16 OZ (MISCELLANEOUS) ×3 IMPLANT
ANCH SUT 2 SWLK 19.1 CLS EYLT (Anchor) ×2 IMPLANT
ANCH SUT FBRTK 1.3 2 TPE (Anchor) ×4 IMPLANT
ANCHOR FBRTK 2.6 SUTURETAP 1.3 (Anchor) ×2 IMPLANT
ANCHOR SUT 1.8 FBRTK KNTLS 2SU (Anchor) ×7 IMPLANT
ANCHOR SWIVELOCK BIO 4.75X19.1 (Anchor) ×1 IMPLANT
APL PRP STRL LF DISP 70% ISPRP (MISCELLANEOUS) ×4
APL SKNCLS STERI-STRIP NONHPOA (GAUZE/BANDAGES/DRESSINGS) ×2
BAG COUNTER SPONGE SURGICOUNT (BAG) ×5 IMPLANT
BAG SPNG CNTER NS LX DISP (BAG) ×4
BENZOIN TINCTURE PRP APPL 2/3 (GAUZE/BANDAGES/DRESSINGS) ×3 IMPLANT
BIT DRILL 3 CANN STRGHT (BIT) ×3
BLADE EXCALIBUR 4.0X13 (MISCELLANEOUS) ×3 IMPLANT
BLADE OSCILLATING/SAGITTAL (BLADE) ×3
BLADE SW THK.38XMED NAR THN (BLADE) ×2 IMPLANT
BNDG COHESIVE 4X5 TAN STRL (GAUZE/BANDAGES/DRESSINGS) ×4 IMPLANT
BNDG ELASTIC 4X5.8 VLCR STR LF (GAUZE/BANDAGES/DRESSINGS) IMPLANT
BNDG ELASTIC 6X5.8 VLCR STR LF (GAUZE/BANDAGES/DRESSINGS) IMPLANT
CANNULA 5.75X71 LONG (CANNULA) IMPLANT
CANNULA TWIST IN 8.25X7CM (CANNULA) ×3 IMPLANT
CHLORAPREP W/TINT 26 (MISCELLANEOUS) ×6 IMPLANT
CLSR STERI-STRIP ANTIMIC 1/2X4 (GAUZE/BANDAGES/DRESSINGS) ×1 IMPLANT
COOLER ICEMAN CLASSIC (MISCELLANEOUS) ×1 IMPLANT
COVER SURGICAL LIGHT HANDLE (MISCELLANEOUS) ×3 IMPLANT
DISSECTOR 4.0MM X 13CM (MISCELLANEOUS) ×3 IMPLANT
DRAIN PENROSE 1/2X12 LTX STRL (WOUND CARE) IMPLANT
DRAPE C-ARM 42X72 X-RAY (DRAPES) IMPLANT
DRAPE IMP U-DRAPE 54X76 (DRAPES) ×3 IMPLANT
DRAPE INCISE IOBAN 66X45 STRL (DRAPES) ×9 IMPLANT
DRAPE ORTHO SPLIT 87X125 STRL (DRAPES) ×4 IMPLANT
DRAPE STERI 35X30 U-POUCH (DRAPES) ×6 IMPLANT
DRAPE U-SHAPE 47X51 STRL (DRAPES) ×6 IMPLANT
DRILL COUNTERSINK CANN 3 (BIT) IMPLANT
DRSG AQUACEL AG ADV 3.5X 6 (GAUZE/BANDAGES/DRESSINGS) ×2 IMPLANT
DRSG AQUACEL AG ADV 3.5X10 (GAUZE/BANDAGES/DRESSINGS) ×1 IMPLANT
DRSG PAD ABDOMINAL 8X10 ST (GAUZE/BANDAGES/DRESSINGS) IMPLANT
DRSG TEGADERM 2-3/8X2-3/4 SM (GAUZE/BANDAGES/DRESSINGS) ×3 IMPLANT
DRSG TEGADERM 4X4.75 (GAUZE/BANDAGES/DRESSINGS) ×6 IMPLANT
DRSG XEROFORM 1X8 (GAUZE/BANDAGES/DRESSINGS) ×1 IMPLANT
DURAPREP 26ML APPLICATOR (WOUND CARE) ×3 IMPLANT
DW OUTFLOW CASSETTE/TUBE SET (MISCELLANEOUS) ×4 IMPLANT
ELECT REM PT RETURN 9FT ADLT (ELECTROSURGICAL) ×3
ELECTRODE REM PT RTRN 9FT ADLT (ELECTROSURGICAL) ×2 IMPLANT
FACESHIELD WRAPAROUND (MASK) IMPLANT
FACESHIELD WRAPAROUND OR TEAM (MASK) ×2 IMPLANT
GAUZE SPONGE 4X4 12PLY STRL (GAUZE/BANDAGES/DRESSINGS) ×5 IMPLANT
GAUZE XEROFORM 1X8 LF (GAUZE/BANDAGES/DRESSINGS) ×3 IMPLANT
GAUZE XEROFORM 5X9 LF (GAUZE/BANDAGES/DRESSINGS) IMPLANT
GLOVE SRG 8 PF TXTR STRL LF DI (GLOVE) ×2 IMPLANT
GLOVE SURG LTX SZ8 (GLOVE) ×3 IMPLANT
GLOVE SURG POLYISO LF SZ7 (GLOVE) ×3 IMPLANT
GLOVE SURG SYN 6.5 ES PF (GLOVE) ×6 IMPLANT
GLOVE SURG SYN 6.5 PF PI (GLOVE) ×2 IMPLANT
GLOVE SURG UNDER POLY LF SZ8 (GLOVE) ×6
GOWN STRL REUS W/ TWL LRG LVL3 (GOWN DISPOSABLE) ×6 IMPLANT
GOWN STRL REUS W/ TWL XL LVL3 (GOWN DISPOSABLE) ×2 IMPLANT
GOWN STRL REUS W/TWL LRG LVL3 (GOWN DISPOSABLE) ×24
GOWN STRL REUS W/TWL XL LVL3 (GOWN DISPOSABLE) ×3
GRAFT BNE HEAD HUM >43 (Tissue) IMPLANT
GUIDEWIRE 0.86MM (WIRE) ×3 IMPLANT
HYDROGEN PEROXIDE 16OZ (MISCELLANEOUS) ×3 IMPLANT
JET LAVAGE IRRISEPT WOUND (IRRIGATION / IRRIGATOR) ×3
KIT BASIN OR (CUSTOM PROCEDURE TRAY) ×3 IMPLANT
KIT PERC INSERT 3.0 KNTLS (KITS) ×2 IMPLANT
KIT STR SPEAR 1.8 FBRTK DISP (KITS) ×3 IMPLANT
KIT TURNOVER KIT B (KITS) ×3 IMPLANT
LASSO 90 CVE QUICKPAS (DISPOSABLE) ×3 IMPLANT
LAVAGE JET IRRISEPT WOUND (IRRIGATION / IRRIGATOR) ×2 IMPLANT
LOOP VESSEL MAXI BLUE (MISCELLANEOUS) ×1 IMPLANT
MANIFOLD NEPTUNE II (INSTRUMENTS) ×3 IMPLANT
NDL SCORPION MULTI FIRE (NEEDLE) IMPLANT
NDL SPNL 18GX3.5 QUINCKE PK (NEEDLE) ×2 IMPLANT
NDL SUT 2-0 SCORPION KNEE (NEEDLE) ×2 IMPLANT
NEEDLE SCORPION MULTI FIRE (NEEDLE) ×3 IMPLANT
NEEDLE SPNL 18GX3.5 QUINCKE PK (NEEDLE) ×3 IMPLANT
NEEDLE SUT 2-0 SCORPION KNEE (NEEDLE) ×3 IMPLANT
NS IRRIG 1000ML POUR BTL (IV SOLUTION) ×3 IMPLANT
PACK SHOULDER (CUSTOM PROCEDURE TRAY) ×3 IMPLANT
PAD ARMBOARD 7.5X6 YLW CONV (MISCELLANEOUS) ×6 IMPLANT
PAD CAST 4YDX4 CTTN HI CHSV (CAST SUPPLIES) IMPLANT
PAD COLD SHLDR WRAP-ON (PAD) ×1 IMPLANT
PADDING CAST COTTON 4X4 STRL (CAST SUPPLIES)
PORT APPOLLO RF 90DEGREE MULTI (SURGICAL WAND) ×1 IMPLANT
RESTRAINT HEAD UNIVERSAL NS (MISCELLANEOUS) ×1 IMPLANT
SCREW MICRO COMP 2.5X30 (Screw) ×2 IMPLANT
SPONGE T-LAP 18X18 ~~LOC~~+RFID (SPONGE) ×6 IMPLANT
SPONGE T-LAP 4X18 ~~LOC~~+RFID (SPONGE) ×7 IMPLANT
STAPLER VISISTAT 35W (STAPLE) IMPLANT
STOCKINETTE IMPERVIOUS 9X36 MD (GAUZE/BANDAGES/DRESSINGS) IMPLANT
SUCTION FRAZIER HANDLE 10FR (MISCELLANEOUS)
SUCTION TUBE FRAZIER 10FR DISP (MISCELLANEOUS) IMPLANT
SUT ETHILON 3 0 PS 1 (SUTURE) ×8 IMPLANT
SUT FIBERWIRE 2-0 18 17.9 3/8 (SUTURE)
SUT MNCRL AB 3-0 PS2 18 (SUTURE) ×1 IMPLANT
SUT SILK 2 0 TIES 10X30 (SUTURE) ×1 IMPLANT
SUT VIC AB 0 CT1 27 (SUTURE) ×6
SUT VIC AB 0 CT1 27XBRD ANBCTR (SUTURE) IMPLANT
SUT VIC AB 1 CT1 27 (SUTURE) ×9
SUT VIC AB 1 CT1 27XBRD ANTBC (SUTURE) IMPLANT
SUT VIC AB 2-0 CT1 27 (SUTURE) ×6
SUT VIC AB 2-0 CT1 TAPERPNT 27 (SUTURE) IMPLANT
SUT VIC AB 2-0 CTB1 (SUTURE) IMPLANT
SUT VIC AB 3-0 CT1 27 (SUTURE) ×6
SUT VIC AB 3-0 CT1 TAPERPNT 27 (SUTURE) ×2 IMPLANT
SUT VICRYL 0 UR6 27IN ABS (SUTURE) ×2 IMPLANT
SUTURE FIBERWR 2-0 18 17.9 3/8 (SUTURE) IMPLANT
SYR 30ML LL (SYRINGE) ×3 IMPLANT
TISSUE GRFT HUMERAL HD TO 43MM (Tissue) ×3 IMPLANT
TOWEL GREEN STERILE (TOWEL DISPOSABLE) ×3 IMPLANT
TOWEL GREEN STERILE FF (TOWEL DISPOSABLE) ×3 IMPLANT
TUBING ARTHROSCOPY IRRIG 16FT (MISCELLANEOUS) ×4 IMPLANT
WATER STERILE IRR 1000ML POUR (IV SOLUTION) ×3 IMPLANT
YANKAUER SUCT BULB TIP NO VENT (SUCTIONS) IMPLANT

## 2021-05-05 NOTE — Transfer of Care (Addendum)
Immediate Anesthesia Transfer of Care Note  Patient: Terrence Riley  Procedure(s) Performed: right shoulder arthroscopy, biceps release, posterior labral repair; (Right: Shoulder) reverse Hill-Sachs allografting, biceps tenodesis (Right)  Patient Location: PACU  Anesthesia Type:General and Regional  Level of Consciousness: oriented, drowsy and patient cooperative  Airway & Oxygen Therapy: Patient Spontanous Breathing and Patient connected to nasal cannula oxygen  Post-op Assessment: Report given to RN and Post -op Vital signs reviewed and stable  Post vital signs: Reviewed  Last Vitals:  Vitals Value Taken Time  BP 129/89 05/05/21 1337  Temp    Pulse 73 05/05/21 1341  Resp 16 05/05/21 1341  SpO2 100 % 05/05/21 1341  Vitals shown include unvalidated device data.  Last Pain:  Vitals:   05/05/21 0604  TempSrc:   PainSc: 6       Patients Stated Pain Goal: 3 (91/63/84 6659)  Complications: No notable events documented.

## 2021-05-05 NOTE — Brief Op Note (Signed)
   05/05/2021  12:41 PM  PATIENT:  Terrence Riley  20 y.o. male  PRE-OPERATIVE DIAGNOSIS:  right shoulder posterior instability  POST-OPERATIVE DIAGNOSIS:  right shoulder posterior instability  PROCEDURE:  Procedure(s): right shoulder arthroscopy, biceps release, posterior labral repair; reverse Hill-Sachs allografting, biceps tenodesis  SURGEON:  Surgeon(s): [name], MD  ASSISTANT: magnant pa  ANESTHESIA:   general  EBL: 50 ml    Total I/O In: 1200 [I.V.:1000; IV Piggyback:200] Out: 1010 [Urine:950; Blood:60]  BLOOD ADMINISTERED: none  DRAINS: none   LOCAL MEDICATIONS USED:  none  SPECIMEN:  No Specimen  COUNTS:  YES  TOURNIQUET:  * No tourniquets in log *  DICTATION: .Other Dictation: Dictation Number [id number]  PLAN OF CARE: Discharge to home after PACU  PATIENT DISPOSITION:  PACU - hemodynamically stable

## 2021-05-05 NOTE — Anesthesia Procedure Notes (Signed)
Anesthesia Regional Block: Interscalene brachial plexus block   Pre-Anesthetic Checklist: , timeout performed,  Correct Patient, Correct Site, Correct Laterality,  Correct Procedure, Correct Position, site marked,  Risks and benefits discussed,  Surgical consent,  Pre-op evaluation,  At surgeon's request and post-op pain management  Laterality: Right  Prep: Maximum Sterile Barrier Precautions used, chloraprep       Needles:  Injection technique: Single-shot  Needle Type: Echogenic Stimulator Needle     Needle Length: 4cm  Needle Gauge: 22     Additional Needles:   Procedures:,,,, ultrasound used (permanent image in chart),,    Narrative:  Start time: 05/05/2021 7:05 AM End time: 05/05/2021 7:09 AM Injection made incrementally with aspirations every 5 mL.  Performed by: Personally  Anesthesiologist: Freddrick March, MD  Additional Notes: Monitors applied. No increased pain on injection. No increased resistance to injection. Injection made in 5cc increments. Good needle visualization. Patient tolerated procedure well.

## 2021-05-05 NOTE — Anesthesia Preprocedure Evaluation (Addendum)
Anesthesia Evaluation  Patient identified by MRN, date of birth, ID band Patient awake    Reviewed: Allergy & Precautions, NPO status , Patient's Chart, lab work & pertinent test results  Airway Mallampati: I  TM Distance: >3 FB Neck ROM: Full    Dental  (+) Chipped, Dental Advisory Given,    Pulmonary asthma ,    Pulmonary exam normal breath sounds clear to auscultation       Cardiovascular negative cardio ROS Normal cardiovascular exam Rhythm:Regular Rate:Normal     Neuro/Psych  Headaches, Seizures - (on keppra, no missed doses), Well Controlled,  negative psych ROS   GI/Hepatic Neg liver ROS, GERD  ,  Endo/Other  negative endocrine ROS  Renal/GU negative Renal ROS  negative genitourinary   Musculoskeletal negative musculoskeletal ROS (+)   Abdominal   Peds  Hematology negative hematology ROS (+)   Anesthesia Other Findings   Reproductive/Obstetrics                            Anesthesia Physical Anesthesia Plan  ASA: 2  Anesthesia Plan: General and Regional   Post-op Pain Management:  Regional for Post-op pain   Induction: Intravenous  PONV Risk Score and Plan: 2 and Midazolam, Dexamethasone and Ondansetron  Airway Management Planned: Oral ETT  Additional Equipment:   Intra-op Plan:   Post-operative Plan: Extubation in OR  Informed Consent: I have reviewed the patients History and Physical, chart, labs and discussed the procedure including the risks, benefits and alternatives for the proposed anesthesia with the patient or authorized representative who has indicated his/her understanding and acceptance.     Dental advisory given  Plan Discussed with: CRNA  Anesthesia Plan Comments:         Anesthesia Quick Evaluation

## 2021-05-05 NOTE — H&P (Signed)
Terrence Riley is an 20 y.o. male.   Chief Complaint: Right shoulder instability HPI: Patient reports multiple dislocation events in the right shoulder since January.  Notably he had left shoulder posterior labral repair with humeral head allografting for reverse Hill-Sachs lesion done over a year ago.  Similar instability symptoms have now affected his right shoulder.  He works as an Clinical biochemist.  He is right-hand dominant.  MRI scan shows healed posterior glenoid fracture with no step-off and reverse Hill-Sachs deformity only slightly smaller than the reverse Hill-Sachs he had on the left-hand side.  Past Medical History:  Diagnosis Date   Allergy    rhinitis   Asthma    as a child   Complication of anesthesia    pt. reports he need more anesthesia with the septoplasty surgery   Eosinophilic esophagitis    heartburn and reflux   Family history of adverse reaction to anesthesia    mother respiratory failure   Headache    Nasal fracture    deviated septum   Pneumonia    1x history of   Premature baby    2 months early   Seizures ([place])    well controlled on meds/ one 2 months ago when meds were late    Past Surgical History:  Procedure Laterality Date   ADENOIDECTOMY     CLOSED REDUCTION NASAL FRACTURE N/A 08/31/2017   Procedure: CLOSED REDUCTION NASAL FRACTURE;  Surgeon: Clyde Canterbury, MD;  Location: [facility];  Service: ENT;  Laterality: N/A;   SEPTOPLASTY N/A 08/31/2017   Procedure: SEPTOPLASTY;  Surgeon: Clyde Canterbury, MD;  Location: [facility];  Service: ENT;  Laterality: N/A;   SHOULDER ARTHROSCOPY WITH LABRAL REPAIR Left 04/30/2020   Procedure: LEFT SHOULDER POSTERIOR LABRAL REPAIR WITH ARTHROSCOPY, BICEPS TENDON RELEASE AND TENODESIS, OPEN ALLOGRAFT FOR REVERSE BANKART LESION;  Surgeon: [name], MD;  Location: [facility];  Service: Orthopedics;  Laterality: Left;   TONSILLECTOMY     and addenoids    Family History  Problem Relation Age of Onset    Cancer Mother        breat   Protein S deficiency Mother    Social History:  reports that he has never smoked. He has never used smokeless tobacco. He reports that he does not drink alcohol and does not use drugs.  Allergies:  Allergies  Allergen Reactions   Lortab [Hydrocodone-Acetaminophen] Hives   Other Anaphylaxis    Peanuts   Zofran [Ondansetron Hcl] Nausea And Vomiting   Citrus Other (See Comments)    Sores in mouth   Dairy Aid [Lactase] Nausea And Vomiting   Influenza Virus Vaccine Other (See Comments)   Soy Allergy Other (See Comments)    Seizures    Tylenol [Acetaminophen] Other (See Comments)    Can cause a fever and seizure activity    Eggs Or Egg-Derived Products Rash    Pt mother reports pt only avoids eggs alone but can tolerate as an ingredient. [facility] 07/27/13   Hydrocodone-Acetaminophen Rash    Medications Prior to Admission  Medication Sig Dispense Refill   cetirizine (ZYRTEC) 5 MG tablet Take 5 mg by mouth at bedtime.     clonazePAM (KLONOPIN) 1 MG tablet Take 1 tablet (1 mg total) by mouth 2 (two) times daily. 60 tablet 5   KEPPRA 1000 MG tablet Take 2 tablets every night (Patient taking differently: Take 1,000 mg by mouth 2 (two) times daily.) 60 tablet 11   lidocaine (LIDODERM) 5 %  Place 1 patch onto the skin daily. Remove & Discard patch within 12 hours or as directed by MD (Patient taking differently: Place 1 patch onto the skin daily as needed (pain). Remove & Discard patch within 12 hours or as directed by MD) 30 patch 0   topiramate (TOPAMAX) 200 MG tablet Take 1 tablet every night (Patient taking differently: Take 200 mg by mouth at bedtime.) 90 tablet 3   EPINEPHrine 0.3 mg/0.3 mL IJ SOAJ injection Inject 0.3 mg into the muscle as needed for anaphylaxis. 1 each 0    No results found for this or any previous visit (from the past 48 hour(s)). No results found.  Review of Systems  Musculoskeletal:  Positive for arthralgias.  All other systems  reviewed and are negative.  Blood pressure 133/86, pulse 60, temperature 97.7 F (36.5 C), temperature source Oral, resp. rate 18, height 6' (1.829 m), weight 59 kg, SpO2 99 %. Physical Exam Vitals reviewed.  HENT:     Head: Normocephalic.     Nose: Nose normal.  Eyes:     Pupils: Pupils are equal, round, and reactive to light.  Cardiovascular:     Rate and Rhythm: Normal rate.     Pulses: Normal pulses.  Pulmonary:     Effort: Pulmonary effort is normal.  Abdominal:     General: Abdomen is flat.  Musculoskeletal:     Cervical back: Normal range of motion.  Skin:    General: Skin is warm.     Capillary Refill: Capillary refill takes less than 2 seconds.  Neurological:     General: No focal deficit present.     Mental Status: He is alert.  Psychiatric:        Mood and Affect: Mood normal.   Ortho exam demonstrates functional deltoid.  Rotator cuff strength on the right is intact infraspinatus supraspinatus and subscap muscle testing.  Patient does have a posterior apprehension but no anterior apprehension.  Less than a centimeter sulcus sign.  Not much in the way of coarse grinding or crepitus with active or passive range of motion of that right shoulder particularly with posterior directed axial loading.    Assessment/Plan  Impression is right shoulder posterior labral tear glenoid fracture which looks to be healed in a reasonable position without displacement and anterior concavity consistent with reverse Hill-Sachs lesion.  Essentially we have to repeat the same procedure on the right shoulder that we did on the left.  That would be labral repair and/or screw fixation of that glenoid with subsequent humeral head allograft placement into that concavity.  Risk and benefits of the procedure discussed with the patient including not limited to infection nerve vessel damage shoulder stiffness as well as potential need for further surgery.  We will also do a biceps tenodesis at the same  time as the surgery.  Patient understands risk and benefits and wishes to proceed.  All questions answered.  Anderson Malta, MD 05/05/2021, 6:16 AM

## 2021-05-05 NOTE — Anesthesia Procedure Notes (Addendum)
Procedure Name: Intubation Date/Time: 05/05/2021 7:30 AM Performed by: Jenne Campus, CRNA Pre-anesthesia Checklist: Patient identified, Emergency Drugs available, Suction available and Patient being monitored Patient Re-evaluated:Patient Re-evaluated prior to induction Oxygen Delivery Method: Circle System Utilized Preoxygenation: Pre-oxygenation with 100% oxygen Induction Type: IV induction Ventilation: Mask ventilation without difficulty Laryngoscope Size: Miller and 3 Grade View: Grade I Tube type: Oral Tube size: 7.0 mm Number of attempts: 1 Airway Equipment and Method: Stylet Placement Confirmation: ETT inserted through vocal cords under direct vision, positive ETCO2 and breath sounds checked- equal and bilateral Secured at: 21 cm Tube secured with: Tape Dental Injury: Teeth and Oropharynx as per pre-operative assessment

## 2021-05-06 ENCOUNTER — Encounter (HOSPITAL_COMMUNITY): Payer: Self-pay | Admitting: Orthopedic Surgery

## 2021-05-06 ENCOUNTER — Other Ambulatory Visit (HOSPITAL_COMMUNITY): Payer: Self-pay

## 2021-05-06 NOTE — Anesthesia Postprocedure Evaluation (Signed)
Anesthesia Post Note  Patient: Terrence Riley  Procedure(s) Performed: right shoulder arthroscopy, biceps release, posterior labral repair; (Right: Shoulder) reverse Hill-Sachs allografting, biceps tenodesis (Right)     Patient location during evaluation: PACU Anesthesia Type: Regional and General Level of consciousness: awake and alert Pain management: pain level controlled Vital Signs Assessment: post-procedure vital signs reviewed and stable Respiratory status: spontaneous breathing, nonlabored ventilation, respiratory function stable and patient connected to nasal cannula oxygen Cardiovascular status: blood pressure returned to baseline and stable Postop Assessment: no apparent nausea or vomiting Anesthetic complications: no   No notable events documented.  Last Vitals:  Vitals:   05/05/21 1352 05/05/21 1407  BP: 121/87 132/90  Pulse: 65 62  Resp: 14 16  Temp:  36.5 C  SpO2: 92% 100%    Last Pain:  Vitals:   05/05/21 1407  TempSrc:   PainSc: 0-No pain                 Sankalp Ferrell L Zilpha Mcandrew

## 2021-05-06 NOTE — Op Note (Signed)
NAMEJOZEF, EISENBEIS MEDICAL RECORD NO: [id number] ACCOUNT NO: [id number] DATE OF BIRTH: Jun 24, 2001 FACILITY: [facility] LOCATION: [facility] PHYSICIAN: Yetta Barre. 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  PROCEDURES:  Right shoulder arthroscopy with debridement, biceps tendon release, posterior labral repair from the 6:30 to 12 o'clock position with subsequent open allografting of humeral head, reverse Hill-Sachs lesion with allograft humeral head, and  open biceps tenodesis.  SURGEON:  [name], MD  ASSISTANT:  Annie Main.  INDICATIONS:  The patient is a 20 year old with right shoulder posterior instability who presents for operative management after explanation of risks and benefits.  DESCRIPTION OF PROCEDURE:  The patient was brought to the operating room where general anesthetic was induced.  Preoperative antibiotics were administered.  Timeout was called.  Right shoulder was examined under anesthesia and found to have 3+ posterior  instability with 1+ anterior instability and less than a centimeter sulcus sign.  External rotation was about 80 degrees.  The patient was placed in the lateral position with the left axilla, left peroneal nerve well padded.  The right arm, shoulder, and  hand were prescrubbed with alcohol and Betadine, allowed to air dry, prepped with ChloraPrep solution, draped in a sterile manner.  Charlie Pitter was used to seal the operative field and cover the axilla.  Arm was suspended in 10 degrees forward flexion, 45  degrees of abduction with 15 pounds of traction.  Timeout was called.  Posterior portal was created medial and inferior to the posterolateral margin of the acromion.  Anterior portal was created  under direct visualization.  A diagnostic arthroscopy was  performed.  Rotator cuff was intact.  Reverse Hill-Sachs lesion was visualized.  The anterior inferior glenohumeral labrum was intact, but there was a tear from about the 6 o'clock to 12 o'clock position.  Placing the camera in the anterior portal and  switching out the cannula in the posterior portal from a metal cannula to an 8.25 Arthrex screw and cannula, the posterior aspect was visualized.  Using a periosteal elevator, the labral tissue was elevated off of the posterior glenoid.  Then, the  glenoid interface was prepared using a rasp and a shaver.  Next, the accessory portal was created in order to optimize drilling angle for the anchors.  At this time, anchors were placed at the 6:30, 7:30, 8:30, 9:30, 10:30, and 11:30 position.  These  knotless anchors were then utilized beginning inferiorly and extending proximally, not going more than 5 mm from the glenoid surface.  A captain hook grasper was used to shuttle the suture for a simple pattern and the sutures were tightened.   This was done in sequential fashion.  Very good solid repair of the labrum back to the glenoid rim was accomplished.  Following this, the portal reversal was again performed.  Camera placed posteriorly.  The biceps tendon was released.  Debridement was  performed of the irritated tissue within the rotator interval.  Instruments were removed.  Portals were closed.  It should be noted that we did use an accessory percutaneous portal for the two superior anchors at the 10:30 and 11:30 position.   Instruments were removed.  Portals were closed using 3-0 Vicryl, 3-0 nylon.  The patient was then transferred from the normal OR bed to the [facility]  bed.  Right shoulder, arm, and hand were then prescrubbed with alcohol and Betadine, allowed to air dry,  prepped with ChloraPrep solution, draped in a sterile manner.  Charlie Pitter was used to seal the operative field.  Charlie Pitter was then used to  cover the operative field.  Next, a deltopectoral approach was made.  Skin and subcutaneous tissue were sharply divided.   The biceps tendon was identified and tagged with a #1 Vicryl suture.  Next, the axillary nerve was palpated and was found to have appropriate tension with tug testing.  Next, the rotator interval was opened.  The Kolbel retractor was placed.  The subscap  was detached from the lesser tuberosity using a 15 blade.  Tagging sutures were placed.  A reverse Hohmann was placed.  The reverse Hill-Sachs defect was visualized.  Curettage was performed.  Good bleeding bony surface was visualized.  Next, from the  allograft humeral head, a bone plug was fashioned, which matched the defect size.  This was then secured using Arthrex 2.5 screws x2, 30 mm each, with good purchase obtained.  There was no step-off, and the patient had good range of motion with internal  rotation after placing the allograft.  Next, thorough irrigation was performed. At this time, 2 SutureTape anchors with 4 SutureTapes per anchor were placed just medial to the lesser tuberosity.  The tendon was then pulled over to its native attachment  site, and using Scorpion, the 8 suture limbs were passed and tied and crossed and then those limbs were then placed in 2 SwiveLocks into the bicipital groove.  This gave a very secure repair.  Biceps tendon was then tenodesed using the accessory sutures  from the SwiveLock, which gave very nice tension in the bicipital groove.  Rotator interval was then closed using #1 Vicryl suture with the arm externally rotated.  Good stability was achieved posteriorly with about 1+ posterior laxity with good solid  endpoint.  Thorough irrigation was performed.  Deltopectoral interval was closed using #1 Vicryl suture followed by interrupted inverted 2-0 Vicryl suture and 3-0 Monocryl with Steri-Strips and Aquacel dressing applied.  The patient tolerated the  procedure well without immediate  complication, placed into an abduction type brace.  He will stay in the brace for at least 3 weeks.  Luke's assistance was required for opening, closing, positioning. His assistance was a medical necessity.   [facility] D: 05/05/2021 12:50:45 pm T: 05/05/2021 5:13:00 pm  JOB: [id number]/ [id number]

## 2021-05-07 ENCOUNTER — Telehealth: Payer: Self-pay

## 2021-05-07 NOTE — Telephone Encounter (Signed)
I have messaged Thurmond Butts asking him to please help with this issue.

## 2021-05-07 NOTE — Telephone Encounter (Signed)
Patient called stating that the [place] is not getting cold and would like to know what he needs to do.  Patient had right shoulder surgery on Monday, 05/05/2021.  CB# [phone].  Please advise.  Thank you.

## 2021-05-10 DIAGNOSIS — M25311 Other instability, right shoulder: Secondary | ICD-10-CM

## 2021-05-10 DIAGNOSIS — S42291P Other displaced fracture of upper end of right humerus, subsequent encounter for fracture with malunion: Secondary | ICD-10-CM

## 2021-05-12 ENCOUNTER — Other Ambulatory Visit: Payer: Self-pay

## 2021-05-12 ENCOUNTER — Ambulatory Visit (INDEPENDENT_AMBULATORY_CARE_PROVIDER_SITE_OTHER): Payer: No Typology Code available for payment source | Admitting: Orthopedic Surgery

## 2021-05-12 ENCOUNTER — Encounter: Payer: Self-pay | Admitting: Orthopedic Surgery

## 2021-05-12 DIAGNOSIS — S43004A Unspecified dislocation of right shoulder joint, initial encounter: Secondary | ICD-10-CM

## 2021-05-18 ENCOUNTER — Encounter: Payer: Self-pay | Admitting: Orthopedic Surgery

## 2021-05-18 NOTE — Progress Notes (Signed)
Post-Op Visit Note   Patient: Terrence Riley           Date of Birth: [birth year]           MRN: [id number] Visit Date: 05/12/2021 PCP: Eulas Post, MD   Assessment & Plan:  Chief Complaint:  Chief Complaint  Patient presents with   Right Shoulder - Post-op Follow-up   Visit Diagnoses:  1. Dislocation of right shoulder joint, initial encounter     Plan: Patient presents now a week out right shoulder arthroscopy biceps tendon release debridement posterior labral repair and reverse Hill-Sachs allografting for fracture deficit.  Biceps tenodesis also performed.  Having some burning and aching pains.  He has weaned off pain medication.  On exam the deltoid fires.  He is in an abduction type brace which keeps his hand pointing forward.  Shoulder stability feels good.  Plan at this time is DC sutures and 2-week return to initiate some range of motion at that time.  Follow-Up Instructions: Return in about 2 weeks (around 05/26/2021).   Orders:  No orders of the defined types were placed in this encounter.  No orders of the defined types were placed in this encounter.   Imaging: No results found.  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   Altered mental status 03/12/2014   Lethargic 03/09/2014   Environmental allergies 03/01/2014   Gastroesophageal reflux disease with esophagitis 10/06/2013   Localization-related (focal) (partial) symptomatic epilepsy and  epileptic syndromes with complex partial seizures, not intractable, without status epilepticus ([place]) 10/06/2013   Spells 07/26/2013   Allergy to other foods 07/13/2013   Prophylactic immunotherapy 07/13/2013   Unspecified asthma, uncomplicated 51/88/4166   Eosinophilic esophagitis 01/24/1600   Gastroesophageal reflux 01/23/2013   Vomiting 01/23/2013   Leukopenia 10/19/2012   Muscle jerks during sleep 10/19/2012   Constipation 08/20/2012   Neutropenia ([place]) 08/20/2012   Allergic rhinitis due to pollen 02/19/2011   Past Medical History:  Diagnosis Date   Allergy    rhinitis   Asthma    as a child   Complication of anesthesia    pt. reports he need more anesthesia with the septoplasty surgery   Eosinophilic esophagitis    heartburn and reflux   Family history of adverse reaction to anesthesia    mother respiratory failure   Headache    Nasal fracture    deviated septum   Pneumonia    1x history of   Premature baby    2 months early   Seizures ([place])    well controlled on meds/ one 2 months ago when meds were late    Family History  Problem Relation Age of Onset   Cancer Mother        breat   Protein S deficiency Mother     Past Surgical History:  Procedure Laterality Date   ADENOIDECTOMY     CLOSED REDUCTION NASAL FRACTURE N/A 08/31/2017   Procedure: CLOSED REDUCTION NASAL FRACTURE;  Surgeon: Clyde Canterbury, MD;  Location: [place];  Service:  ENT;  Laterality: N/A;   ORIF HUMERUS FRACTURE Right 05/05/2021   Procedure: reverse Hill-Sachs allografting, biceps tenodesis;  Surgeon: [name], MD;  Location: [place];  Service: Orthopedics;  Laterality: Right;   SEPTOPLASTY N/A 08/31/2017   Procedure: SEPTOPLASTY;  Surgeon: Clyde Canterbury, MD;  Location: [place];  Service: ENT;  Laterality: N/A;   SHOULDER ARTHROSCOPY WITH LABRAL REPAIR Left 04/30/2020   Procedure: LEFT SHOULDER POSTERIOR LABRAL REPAIR WITH ARTHROSCOPY, BICEPS TENDON RELEASE AND  TENODESIS, OPEN ALLOGRAFT FOR REVERSE BANKART LESION;  Surgeon: [name], MD;  Location: [facility] [facility];  Service: Orthopedics;  Laterality: Left;   SHOULDER ARTHROSCOPY WITH LABRAL REPAIR Right 05/05/2021   Procedure: right shoulder arthroscopy, biceps release, posterior labral repair;;  Surgeon: [name], MD;  Location: [place];  Service: Orthopedics;  Laterality: Right;   TONSILLECTOMY     and addenoids   Social History   Occupational History   Not on file  Tobacco Use   Smoking status: Never   Smokeless tobacco: Never  Vaping Use   Vaping Use: Never used  Substance and Sexual Activity   Alcohol use: No   Drug use: No   Sexual activity: Never

## 2021-05-26 ENCOUNTER — Encounter: Payer: Self-pay | Admitting: Orthopedic Surgery

## 2021-05-26 ENCOUNTER — Other Ambulatory Visit: Payer: Self-pay

## 2021-05-26 ENCOUNTER — Ambulatory Visit (INDEPENDENT_AMBULATORY_CARE_PROVIDER_SITE_OTHER): Payer: No Typology Code available for payment source | Admitting: Surgical

## 2021-05-26 DIAGNOSIS — S43004A Unspecified dislocation of right shoulder joint, initial encounter: Secondary | ICD-10-CM

## 2021-05-26 NOTE — Progress Notes (Signed)
Post-Op Visit Note   Patient: Terrence Riley           Date of Birth: March 06, 2001           MRN: [id number] Visit Date: 05/26/2021 PCP: Eulas Post, MD   Assessment & Plan:  Chief Complaint:  Chief Complaint  Patient presents with   Right Shoulder - Routine Post Op     05/05/21 (3w)   right shoulder arthroscopy, biceps release, posterior labral repair; - Right   reverse Hill-Sachs allografting, biceps tenodesis - Right      Visit Diagnoses:  1. Dislocation of right shoulder joint, initial encounter     Plan: Patient is a 20 year old male who presents s/p right shoulder arthroscopy with posterior labral repair and reverse Hill-Sachs allograft on 05/05/2021.  Patient reports he is doing well with no complaints of pain.  He has had no instability events since procedure.  He really has no complaints and is not taking any pain medication.  Denies any radicular pain or numbness/tingling down the arm.  Denies any difficulties with motor function.  He has remained in the sling and has not returned to any athletic activities or return to working.  On examination, incisions are well-healed with no evidence of infection or dehiscence.  Shoulder is stable to anterior and posterior directed forces.  Axillary nerve intact with deltoid firing.  Sensation intact over the anterior, lateral, posterior deltoid.  Subscap intact with decent strength on examination today.  Neurovascular intact distally with intact EPL, wrist extension, finger abduction, finger adduction, FPL.  2+ radial pulse of the right upper extremity.  Plan is to remain in sling for now but he may come out of of his sling 3 times a day to work on pendulums.  Specifically cautioned him against lifting with the arm, overhead movement, bringing his arm across his body toward the left side of his body.  Patient understands and agrees with plan.  Follow-up in 3 weeks for clinical recheck with likely discontinuation of sling at that  point and more extensive range of motion work.  Follow-Up Instructions: No follow-ups on file.   Orders:  No orders of the defined types were placed in this encounter.  No orders of the defined types were placed in this encounter.   Imaging: No results found.  PMFS History: Patient Active Problem List   Diagnosis Date Noted   Shoulder instability, right    Humeral head fracture, right, with malunion, subsequent encounter    Esophagitis determined by endoscopy 07/24/2020   Labral tear of shoulder, left, subsequent encounter 02/13/2020   Shoulder dislocation, left, subsequent encounter 02/13/2020   Seizure disorder ([place]) 12/22/2019   Migraines 12/22/2019   Mild intermittent asthma 12/22/2019   Migraine without aura and without status migrainosus, not intractable 06/29/2019   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   Altered mental status 03/12/2014   Lethargic 03/09/2014   Environmental allergies 03/01/2014   Gastroesophageal reflux disease with esophagitis 10/06/2013   Localization-related (focal) (partial) symptomatic epilepsy and epileptic syndromes with complex partial seizures, not intractable, without status epilepticus ([place]) 10/06/2013   Spells 07/26/2013   Allergy to other foods 07/13/2013   Prophylactic immunotherapy 07/13/2013   Unspecified asthma, uncomplicated 28/41/3244   Eosinophilic esophagitis 07/29/7251   Gastroesophageal reflux 01/23/2013   Vomiting 01/23/2013   Leukopenia 10/19/2012   Muscle jerks during sleep 10/19/2012   Constipation 08/20/2012   Neutropenia ([place]) 08/20/2012   Allergic rhinitis due to pollen 02/19/2011   Past Medical History:  Diagnosis  Date   Allergy    rhinitis   Asthma    as a child   Complication of anesthesia    pt. reports he need more anesthesia with the septoplasty surgery   Eosinophilic esophagitis    heartburn and reflux   Family history of adverse reaction to anesthesia    mother respiratory failure   Headache    Nasal  fracture    deviated septum   Pneumonia    1x history of   Premature baby    2 months early   Seizures ([place])    well controlled on meds/ one 2 months ago when meds were late    Family History  Problem Relation Age of Onset   Cancer Mother        breat   Protein S deficiency Mother     Past Surgical History:  Procedure Laterality Date   ADENOIDECTOMY     CLOSED REDUCTION NASAL FRACTURE N/A 08/31/2017   Procedure: CLOSED REDUCTION NASAL FRACTURE;  Surgeon: Clyde Canterbury, MD;  Location: [facility];  Service: ENT;  Laterality: N/A;   ORIF HUMERUS FRACTURE Right 05/05/2021   Procedure: reverse Hill-Sachs allografting, biceps tenodesis;  Surgeon: [name], MD;  Location: [facility];  Service: Orthopedics;  Laterality: Right;   SEPTOPLASTY N/A 08/31/2017   Procedure: SEPTOPLASTY;  Surgeon: Clyde Canterbury, MD;  Location: [facility];  Service: ENT;  Laterality: N/A;   SHOULDER ARTHROSCOPY WITH LABRAL REPAIR Left 04/30/2020   Procedure: LEFT SHOULDER POSTERIOR LABRAL REPAIR WITH ARTHROSCOPY, BICEPS TENDON RELEASE AND TENODESIS, OPEN ALLOGRAFT FOR REVERSE BANKART LESION;  Surgeon: [name], MD;  Location: [facility];  Service: Orthopedics;  Laterality: Left;   SHOULDER ARTHROSCOPY WITH LABRAL REPAIR Right 05/05/2021   Procedure: right shoulder arthroscopy, biceps release, posterior labral repair;;  Surgeon: [name], MD;  Location: [facility];  Service: Orthopedics;  Laterality: Right;   TONSILLECTOMY     and addenoids   Social History   Occupational History   Not on file  Tobacco Use   Smoking status: Never   Smokeless tobacco: Never  Vaping Use   Vaping Use: Never used  Substance and Sexual Activity   Alcohol use: No   Drug use: No   Sexual activity: Never

## 2021-06-10 ENCOUNTER — Emergency Department (HOSPITAL_COMMUNITY)
Admission: EM | Admit: 2021-06-10 | Discharge: 2021-06-10 | Disposition: A | Discharge status: Home / Self Care | Payer: No Typology Code available for payment source | Attending: Emergency Medicine | Admitting: Emergency Medicine

## 2021-06-10 ENCOUNTER — Encounter (HOSPITAL_COMMUNITY): Payer: Self-pay

## 2021-06-10 ENCOUNTER — Emergency Department (HOSPITAL_COMMUNITY): Payer: No Typology Code available for payment source

## 2021-06-10 ENCOUNTER — Other Ambulatory Visit: Payer: Self-pay

## 2021-06-10 DIAGNOSIS — Z79899 Other long term (current) drug therapy: Secondary | ICD-10-CM | POA: Diagnosis not present

## 2021-06-10 DIAGNOSIS — Y900 Blood alcohol level of less than 20 mg/100 ml: Secondary | ICD-10-CM | POA: Insufficient documentation

## 2021-06-10 DIAGNOSIS — R0602 Shortness of breath: Secondary | ICD-10-CM | POA: Insufficient documentation

## 2021-06-10 DIAGNOSIS — J4520 Mild intermittent asthma, uncomplicated: Secondary | ICD-10-CM | POA: Diagnosis not present

## 2021-06-10 DIAGNOSIS — R079 Chest pain, unspecified: Secondary | ICD-10-CM | POA: Insufficient documentation

## 2021-06-10 LAB — CBC WITH DIFFERENTIAL/PLATELET
Abs Immature Granulocytes: 0 10*3/uL (ref 0.00–0.07)
Basophils Absolute: 0.1 10*3/uL (ref 0.0–0.1)
Basophils Relative: 1 %
Eosinophils Absolute: 0.2 10*3/uL (ref 0.0–0.5)
Eosinophils Relative: 5 %
HCT: 44.2 % (ref 39.0–52.0)
Hemoglobin: 15.3 g/dL (ref 13.0–17.0)
Immature Granulocytes: 0 %
Lymphocytes Relative: 41 %
Lymphs Abs: 1.8 10*3/uL (ref 0.7–4.0)
MCH: 29.2 pg (ref 26.0–34.0)
MCHC: 34.6 g/dL (ref 30.0–36.0)
MCV: 84.4 fL (ref 80.0–100.0)
Monocytes Absolute: 0.4 10*3/uL (ref 0.1–1.0)
Monocytes Relative: 9 %
Neutro Abs: 1.9 10*3/uL (ref 1.7–7.7)
Neutrophils Relative %: 44 %
Platelets: 173 10*3/uL (ref 150–400)
RBC: 5.24 MIL/uL (ref 4.22–5.81)
RDW: 12.1 % (ref 11.5–15.5)
WBC: 4.3 10*3/uL (ref 4.0–10.5)
nRBC: 0 % (ref 0.0–0.2)

## 2021-06-10 LAB — COMPREHENSIVE METABOLIC PANEL
ALT: 24 U/L (ref 0–44)
AST: 23 U/L (ref 15–41)
Albumin: 4.6 g/dL (ref 3.5–5.0)
Alkaline Phosphatase: 85 U/L (ref 38–126)
Anion gap: 6 (ref 5–15)
BUN: 10 mg/dL (ref 6–20)
CO2: 25 mmol/L (ref 22–32)
Calcium: 9.4 mg/dL (ref 8.9–10.3)
Chloride: 108 mmol/L (ref 98–111)
Creatinine, Ser: 1.08 mg/dL (ref 0.61–1.24)
GFR, Estimated: >60 mL/min (ref >60)
Glucose, Bld: 94 mg/dL (ref 70–99)
Potassium: 3.9 mmol/L (ref 3.5–5.1)
Sodium: 139 mmol/L (ref 135–145)
Total Bilirubin: 0.7 mg/dL (ref 0.3–1.2)
Total Protein: 7.9 g/dL (ref 6.5–8.1)

## 2021-06-10 LAB — ETHANOL: Alcohol, Ethyl (B): <10 mg/dL (ref <10)

## 2021-06-10 IMAGING — CR DG CHEST 2V
2 series · 2 of 2 positions shown · non-contrast
Comparison: [DATE]

CLINICAL DATA: Of breath and chest pain for 1 day

EXAM:
CHEST - 2 VIEW

[w chest lat]
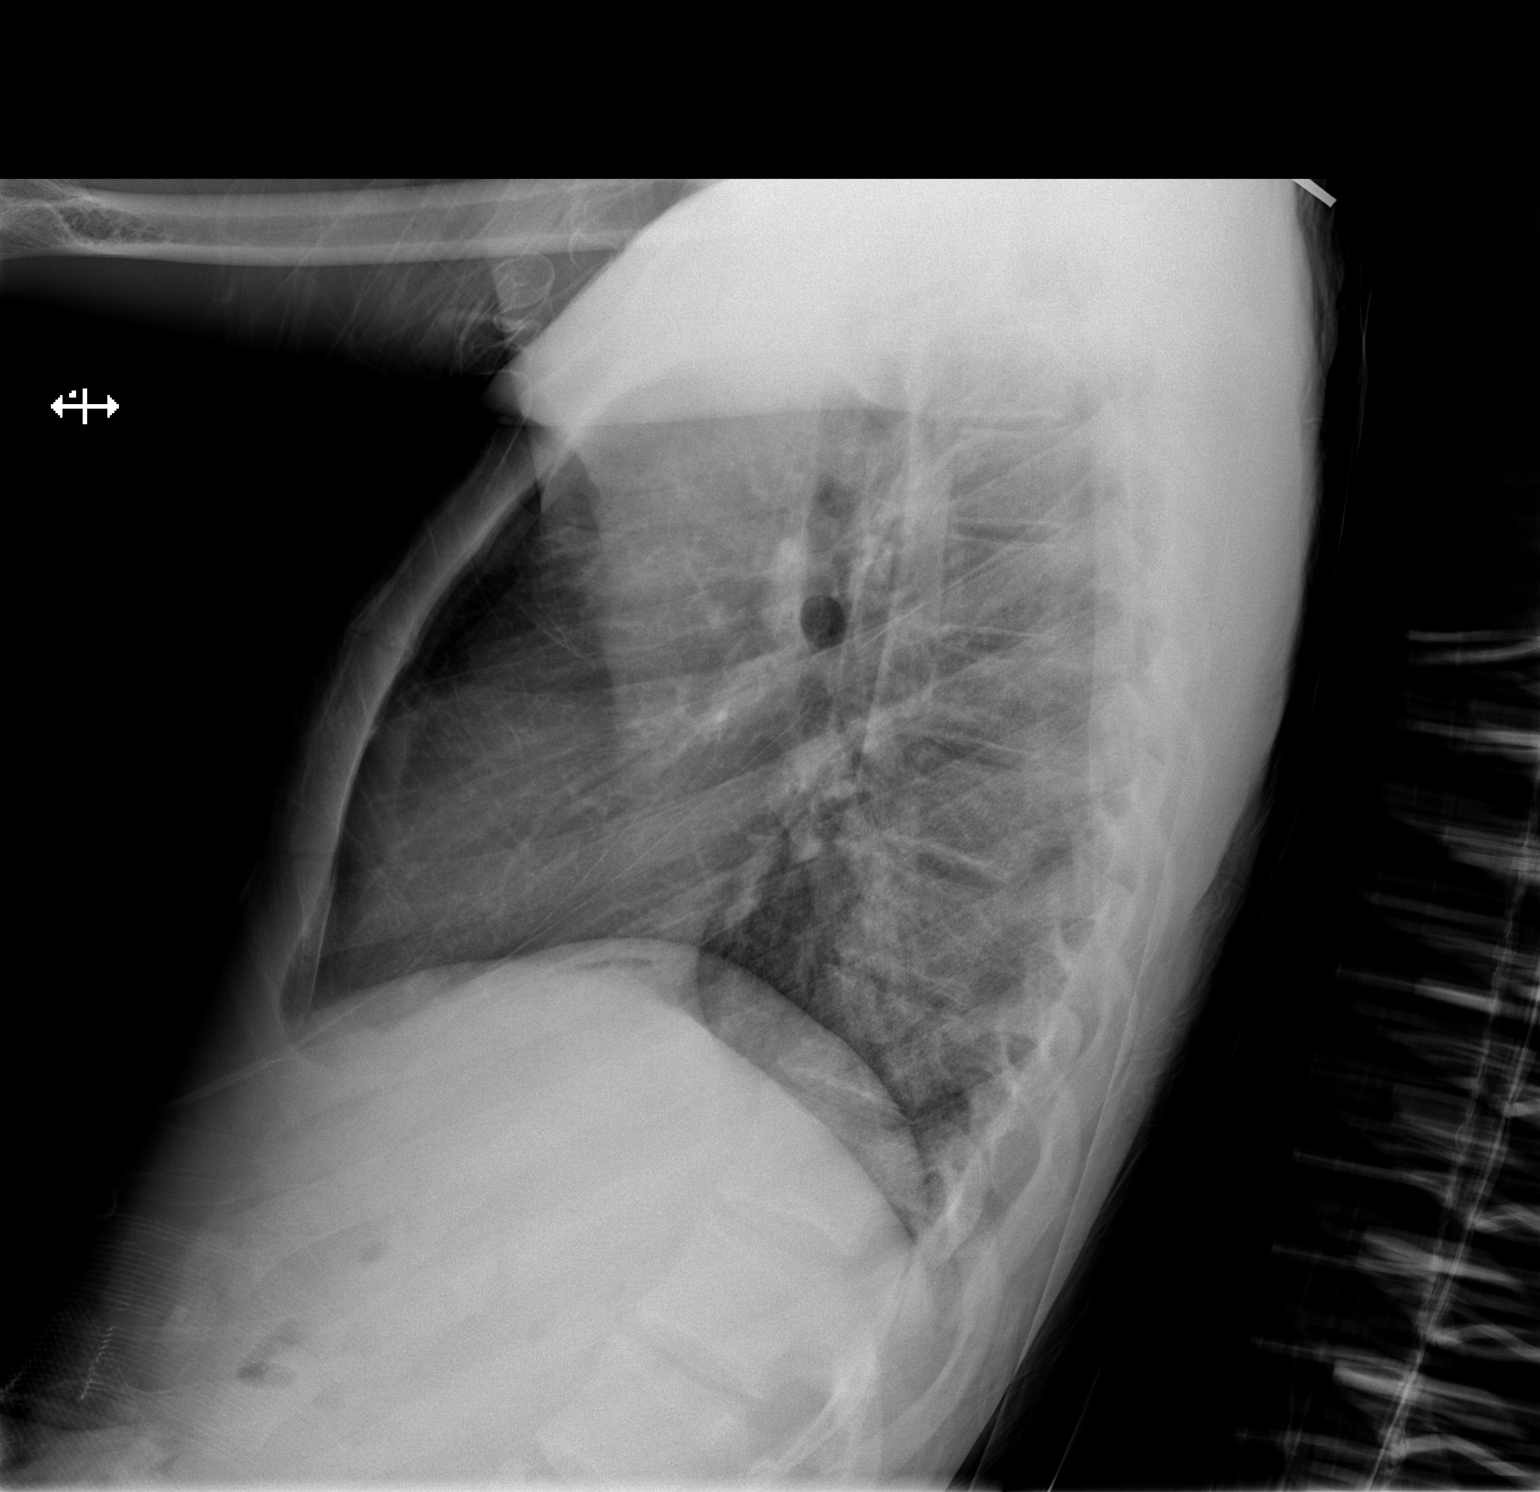

[x chest ap]
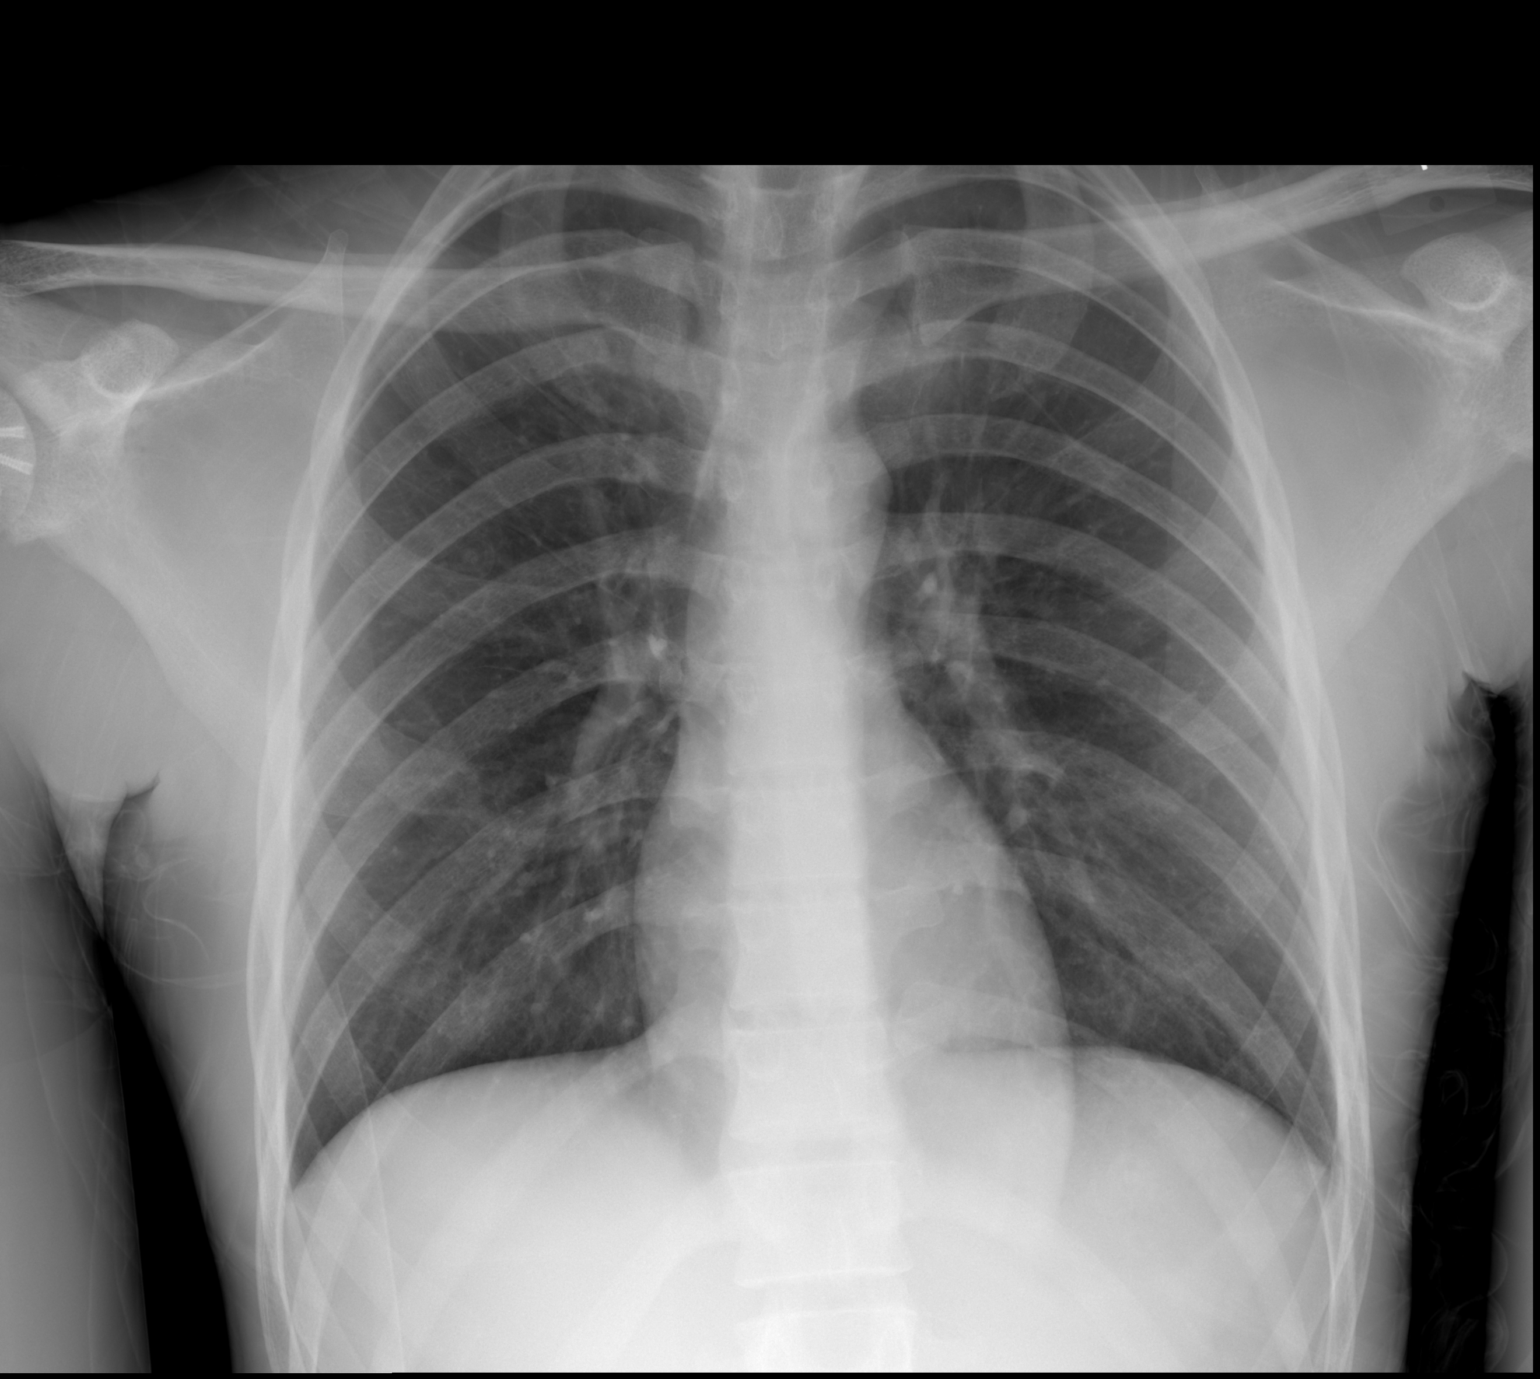

[2 of 2 positions shown; findings below may reference images not displayed]

FINDINGS: The heart size and mediastinal contours are within normal limits.
Both lungs are clear. The visualized skeletal structures are
unremarkable.
IMPRESSION: No active cardiopulmonary disease.

## 2021-06-10 MED ORDER — PROMETHAZINE HCL 25 MG PO TABS: 25.0000 mg | ORAL_TABLET | Freq: Three times a day (TID) | ORAL | 0 refills | Status: DC | PRN

## 2021-06-10 NOTE — ED Provider Notes (Signed)
[facility] DEPT Provider Note   CSN: [id number] Arrival date & time: 06/10/21  0042     History Chief Complaint  Patient presents with   Chest Pain   Shortness of Breath    Terrence Riley is a 20 y.o. male.  20 year old male presents to the emergency department for evaluation of chest pain.  Symptoms began a few hours ago and have been constant.  Pain is across his chest and is worse when taking a deep breath, though he has not been short of breath.  Took ibuprofen for symptoms prior to arrival without relief.  He has not had any fevers, vomiting, hemoptysis, leg swelling, recent surgeries or hospitalizations.  Further denies illicit drug use, family history of sudden cardiac death.  Has been compliant with his daily medications.  The history is provided by the patient. No language interpreter was used.  Chest Pain Associated symptoms: shortness of breath   Shortness of Breath Associated symptoms: chest pain       Past Medical History:  Diagnosis Date   Allergy    rhinitis   Asthma    as a child   Complication of anesthesia    pt. reports he need more anesthesia with the septoplasty surgery   Eosinophilic esophagitis    heartburn and reflux   Family history of adverse reaction to anesthesia    mother respiratory failure   Headache    Nasal fracture    deviated septum   Pneumonia    1x history of   Premature baby    2 months early   Seizures ([place])    well controlled on meds/ one 2 months ago when meds were late    Patient Active Problem List   Diagnosis Date Noted   Shoulder instability, right    Humeral head fracture, right, with malunion, subsequent encounter    Esophagitis determined by endoscopy 07/24/2020   Labral tear of shoulder, left, subsequent encounter 02/13/2020   Shoulder dislocation, left, subsequent encounter 02/13/2020   Seizure disorder ([place]) 12/22/2019   Migraines 12/22/2019   Mild intermittent asthma  12/22/2019   Migraine without aura and without status migrainosus, not intractable 06/29/2019   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   Altered mental status 03/12/2014   Lethargic 03/09/2014   Environmental allergies 03/01/2014   Gastroesophageal reflux disease with esophagitis 10/06/2013   Localization-related (focal) (partial) symptomatic epilepsy and epileptic syndromes with complex partial seizures, not intractable, without status epilepticus ([place]) 10/06/2013   Spells 07/26/2013   Allergy to other foods 07/13/2013   Prophylactic immunotherapy 07/13/2013   Unspecified asthma, uncomplicated 93/23/5573   Eosinophilic esophagitis 22/08/5425   Gastroesophageal reflux 01/23/2013   Vomiting 01/23/2013   Leukopenia 10/19/2012   Muscle jerks during sleep 10/19/2012   Constipation 08/20/2012   Neutropenia ([place]) 08/20/2012   Allergic rhinitis due to pollen 02/19/2011    Past Surgical History:  Procedure Laterality Date   ADENOIDECTOMY     CLOSED REDUCTION NASAL FRACTURE N/A 08/31/2017   Procedure: CLOSED REDUCTION NASAL FRACTURE;  Surgeon: Clyde Canterbury, MD;  Location: [facility];  Service: ENT;  Laterality: N/A;   ORIF HUMERUS FRACTURE Right 05/05/2021   Procedure: reverse Hill-Sachs allografting, biceps tenodesis;  Surgeon: [name], MD;  Location: [facility];  Service: Orthopedics;  Laterality: Right;   SEPTOPLASTY N/A 08/31/2017   Procedure: SEPTOPLASTY;  Surgeon: Clyde Canterbury, MD;  Location: [facility];  Service: ENT;  Laterality: N/A;   SHOULDER ARTHROSCOPY WITH LABRAL REPAIR Left 04/30/2020  Procedure: LEFT SHOULDER POSTERIOR LABRAL REPAIR WITH ARTHROSCOPY, BICEPS TENDON RELEASE AND TENODESIS, OPEN ALLOGRAFT FOR REVERSE BANKART LESION;  Surgeon: [name], MD;  Location: [facility];  Service: Orthopedics;  Laterality: Left;   SHOULDER ARTHROSCOPY WITH LABRAL REPAIR Right 05/05/2021   Procedure: right shoulder arthroscopy, biceps release, posterior labral repair;;   Surgeon: [name], MD;  Location: [facility];  Service: Orthopedics;  Laterality: Right;   TONSILLECTOMY     and addenoids       Family History  Problem Relation Age of Onset   Cancer Mother        breat   Protein S deficiency Mother     Social History   Tobacco Use   Smoking status: Never   Smokeless tobacco: Never  Vaping Use   Vaping Use: Never used  Substance Use Topics   Alcohol use: No   Drug use: No    Home Medications Prior to Admission medications   Medication Sig Start Date End Date Taking? Authorizing Provider  promethazine (PHENERGAN) 25 MG tablet Take 1 tablet (25 mg total) by mouth every 8 (eight) hours as needed for nausea or vomiting. 06/10/21  Yes Antonietta Breach, PA-C  celecoxib (CELEBREX) 100 MG capsule Take 1 capsule (100 mg total) by mouth 2 (two) times daily. 05/05/21 05/05/22  Magnant, Charles L, PA-C  cetirizine (ZYRTEC) 5 MG tablet Take 5 mg by mouth at bedtime.    [provider]  clonazePAM (KLONOPIN) 1 MG tablet Take 1 tablet (1 mg total) by mouth 2 (two) times daily. 05/05/21   Cameron Sprang, MD  EPINEPHrine 0.3 mg/0.3 mL IJ SOAJ injection Inject 0.3 mg into the muscle as needed for anaphylaxis. 07/10/20   McDonald, Mia A, PA-C  KEPPRA 1000 MG tablet Take 2 tablets every night Patient taking differently: Take 1,000 mg by mouth 2 (two) times daily. 04/18/21   Cameron Sprang, MD  lidocaine (LIDODERM) 5 % Place 1 patch onto the skin daily. Remove & Discard patch within 12 hours or as directed by MD Patient taking differently: Place 1 patch onto the skin daily as needed (pain). Remove & Discard patch within 12 hours or as directed by MD 02/25/20   Henderly, Britni A, PA-C  methocarbamol (ROBAXIN) 500 MG tablet Take 1 tablet (500 mg total) by mouth every 8 (eight) hours as needed. 05/05/21   Magnant, Charles L, PA-C  oxyCODONE (ROXICODONE) 5 MG immediate release tablet Take 1 tablet (5 mg total) by mouth every 4 (four) hours as needed.  05/05/21 05/05/22  Magnant, Charles L, PA-C  topiramate (TOPAMAX) 200 MG tablet Take 1 tablet every night Patient taking differently: Take 200 mg by mouth at bedtime. 04/16/21   Cameron Sprang, MD    Allergies    Lortab [hydrocodone-acetaminophen], Other, Zofran [name] hcl], Citrus, Dairy aid [tilactase], Influenza virus vaccine, Soy allergy, Tylenol [acetaminophen], Eggs or egg-derived products, and Hydrocodone-acetaminophen  Review of Systems   Review of Systems  Respiratory:  Positive for shortness of breath.   Cardiovascular:  Positive for chest pain.  Ten systems reviewed and are negative for acute change, except as noted in the HPI.    Physical Exam Updated Vital Signs BP 117/71 (BP Location: Right Arm)   Pulse 63   Temp 98.7 F (37.1 C) (Oral)   Resp 17   Ht 6' (1.829 m)   Wt 59 kg   SpO2 100%   BMI 17.63 kg/m   Physical Exam Vitals and nursing note reviewed.  Constitutional:      General: He is not in acute distress.    Appearance: He is well-developed. He is not diaphoretic.     Comments: Nontoxic appearing and in NAD  HENT:     Head: Normocephalic and atraumatic.  Eyes:     General: No scleral icterus.    Conjunctiva/sclera: Conjunctivae normal.  Cardiovascular:     Rate and Rhythm: Normal rate and regular rhythm.     Pulses: Normal pulses.  Pulmonary:     Effort: Pulmonary effort is normal. No respiratory distress.     Breath sounds: No wheezing, rhonchi or rales.     Comments: Lungs CTAB Abdominal:     Comments: Soft, nondistended, nontender  Musculoskeletal:        General: Normal range of motion.     Cervical back: Normal range of motion.  Skin:    General: Skin is warm and dry.     Coloration: Skin is not pale.     Findings: No erythema or rash.  Neurological:     Mental Status: He is alert and oriented to person, place, and time.     Coordination: Coordination normal.  Psychiatric:        Behavior: Behavior normal.    ED Results /  Procedures / Treatments   Labs (all labs ordered are listed, but only abnormal results are displayed) Labs Reviewed  CBC WITH DIFFERENTIAL/PLATELET  COMPREHENSIVE METABOLIC PANEL  ETHANOL    EKG EKG Interpretation  Date/Time:  Tuesday June 10 2021 01:01:49 EST Ventricular Rate:  90 PR Interval:  132 QRS Duration: 81 QT Interval:  344 QTC Calculation: 421 R Axis:   75 Text Interpretation: Sinus rhythm Multiple ventricular premature complexes LVH by voltage When compared with ECG of 04/10/2021, Premature ventricular complexes are now present Confirmed by Delora Fuel ([id number]) on 06/10/2021 1:12:18 AM  Radiology DG Chest 2 View  Result Date: 06/10/2021 CLINICAL DATA:  Of breath and chest pain for 1 day EXAM: CHEST - 2 VIEW COMPARISON:  04/10/2021 FINDINGS: The heart size and mediastinal contours are within normal limits. Both lungs are clear. The visualized skeletal structures are unremarkable. IMPRESSION: No active cardiopulmonary disease. Electronically Signed   By: Inez Catalina M.D.   On: 06/10/2021 03:26    Procedures Procedures   Medications Ordered in ED Medications - No data to display  ED Course  I have reviewed the triage vital signs and the nursing notes.  Pertinent labs & imaging results that were available during my care of the patient were reviewed by me and considered in my medical decision making (see chart for details).    MDM Rules/Calculators/A&P                           Patient presents to the emergency department for evaluation of chest pain.  EKG is nonischemic and chest x-ray without acute cardiopulmonary abnormality.  No pneumothorax, pneumonia, pleural effusion.  Pulmonary embolus further considered; however, patient without tachycardia, tachypnea, dyspnea, hypoxia.  Patient is PERC negative.  He reports a history of eosinophilic esophagitis.  Have explained to the patient that this can sometimes cause chest pain, shortness of breath, nausea.  He  has been advised to continue follow-up with his primary care doctor.  Return precautions discussed and provided. Patient discharged in stable condition with no unaddressed concerns.   Final Clinical Impression(s) / ED Diagnoses Final diagnoses:  Chest pain, unspecified type    Rx / DC  Orders ED Discharge Orders          Ordered    promethazine (PHENERGAN) 25 MG tablet  Every 8 hours PRN        06/10/21 0341             Antonietta Breach, PA-C 25/52/58 [id number]    Delora Fuel, MD 47/58/30 [phone]

## 2021-06-10 NOTE — Discharge Instructions (Addendum)
Your evaluation in the ED today was reassuring.  We recommend follow-up with your primary care doctor.  You have been prescribed Phenergan to take as needed for nausea.  Continue your other prescribed medications.  Return for new or concerning symptoms.

## 2021-06-10 NOTE — ED Triage Notes (Signed)
Pt reports with chest pain and shortness of breath that started last night. Pt has a hx of seizures.

## 2021-06-16 ENCOUNTER — Encounter: Payer: Self-pay | Admitting: Orthopedic Surgery

## 2021-06-16 ENCOUNTER — Ambulatory Visit (INDEPENDENT_AMBULATORY_CARE_PROVIDER_SITE_OTHER): Payer: No Typology Code available for payment source | Admitting: Orthopedic Surgery

## 2021-06-16 ENCOUNTER — Other Ambulatory Visit: Payer: Self-pay

## 2021-06-16 DIAGNOSIS — S43004A Unspecified dislocation of right shoulder joint, initial encounter: Secondary | ICD-10-CM

## 2021-06-17 ENCOUNTER — Encounter: Payer: Self-pay | Admitting: Orthopedic Surgery

## 2021-06-17 NOTE — Progress Notes (Signed)
Office Visit Note   Patient: Terrence Riley           Date of Birth: 01/21/2001           MRN: [id number] Visit Date: 06/16/2021 Requested by: Eulas Post, MD [place],  [place] [id number] PCP: Eulas Post, MD  Subjective: Chief Complaint  Patient presents with   Right Shoulder - Routine Post Op    HPI: Patient presents now about 6 weeks out right shoulder arthroscopy with posterior labral repair and open reverse Hill-Sachs bone grafting and biceps tenodesis.  Not having any pain.  On exam his shoulder feels stable.  Shoulder still somewhat stiff as would be expected.  Subscap strength is 4+ out of 5.  Plan at this time is to discontinue sling.  Physical therapy here 2 times a week for 4 weeks with no crossed arm adduction and and then 3 times a week for 4 weeks thereafter.  In general I do not really want him stretching out that posterior capsule yet but it is okay for him to do some external rotation up to 30 degrees.  Follow-up in 6 weeks for clinical recheck.              ROS: See above  Assessment & Plan: Visit Diagnoses:  1. Dislocation of right shoulder joint, initial encounter     Plan: See above  Follow-Up Instructions: No follow-ups on file.   Orders:  No orders of the defined types were placed in this encounter.  No orders of the defined types were placed in this encounter.     Procedures: No procedures performed   Clinical Data: No additional findings.  Objective: Vital Signs: There were no vitals taken for this visit.  Physical Exam: See above  Ortho Exam: See above  Specialty Comments:  No specialty comments available.  Imaging: No results found.   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   Altered mental status 03/12/2014   Lethargic 03/09/2014   Environmental allergies 03/01/2014   Gastroesophageal reflux disease with esophagitis 10/06/2013   Localization-related (focal) (partial) symptomatic epilepsy and epileptic syndromes with complex partial seizures, not intractable, without status epilepticus ([place]) 10/06/2013   Spells 07/26/2013   Allergy to other foods 07/13/2013   Prophylactic immunotherapy 07/13/2013   Unspecified asthma, uncomplicated 19/14/7829   Eosinophilic esophagitis 56/21/3086   Gastroesophageal reflux 01/23/2013   Vomiting 01/23/2013   Leukopenia 10/19/2012   Muscle jerks during sleep 10/19/2012   Constipation 08/20/2012   Neutropenia ([place]) 08/20/2012   Allergic rhinitis due to pollen 02/19/2011   Past Medical History:  Diagnosis Date   Allergy    rhinitis   Asthma    as a child   Complication of anesthesia    pt. reports he need more anesthesia with the septoplasty surgery   Eosinophilic esophagitis    heartburn and reflux   Family history of adverse reaction to anesthesia    mother respiratory failure   Headache    Nasal fracture  deviated septum   Pneumonia    1x history of   Premature baby    2 months early   Seizures ([place])    well controlled on meds/ one 2 months ago when meds were late    Family History  Problem Relation Age of Onset   Cancer Mother        breat   Protein S deficiency Mother     Past Surgical History:  Procedure Laterality Date   ADENOIDECTOMY     CLOSED REDUCTION NASAL FRACTURE N/A 08/31/2017   Procedure: CLOSED REDUCTION NASAL FRACTURE;  Surgeon: Clyde Canterbury, MD;  Location: [facility];  Service: ENT;   Laterality: N/A;   ORIF HUMERUS FRACTURE Right 05/05/2021   Procedure: reverse Hill-Sachs allografting, biceps tenodesis;  Surgeon: [name], MD;  Location: [facility];  Service: Orthopedics;  Laterality: Right;   SEPTOPLASTY N/A 08/31/2017   Procedure: SEPTOPLASTY;  Surgeon: Clyde Canterbury, MD;  Location: [facility];  Service: ENT;  Laterality: N/A;   SHOULDER ARTHROSCOPY WITH LABRAL REPAIR Left 04/30/2020   Procedure: LEFT SHOULDER POSTERIOR LABRAL REPAIR WITH ARTHROSCOPY, BICEPS TENDON RELEASE AND TENODESIS, OPEN ALLOGRAFT FOR REVERSE BANKART LESION;  Surgeon: [name], MD;  Location: [facility];  Service: Orthopedics;  Laterality: Left;   SHOULDER ARTHROSCOPY WITH LABRAL REPAIR Right 05/05/2021   Procedure: right shoulder arthroscopy, biceps release, posterior labral repair;;  Surgeon: [name], MD;  Location: [facility];  Service: Orthopedics;  Laterality: Right;   TONSILLECTOMY     and addenoids   Social History   Occupational History   Not on file  Tobacco Use   Smoking status: Never   Smokeless tobacco: Never  Vaping Use   Vaping Use: Never used  Substance and Sexual Activity   Alcohol use: No   Drug use: No   Sexual activity: Never

## 2021-06-19 DIAGNOSIS — J690 Pneumonitis due to inhalation of food and vomit: Secondary | ICD-10-CM | POA: Diagnosis present

## 2021-06-19 DIAGNOSIS — Z91010 Allergy to peanuts: Secondary | ICD-10-CM

## 2021-06-19 DIAGNOSIS — R4781 Slurred speech: Secondary | ICD-10-CM | POA: Diagnosis present

## 2021-06-19 DIAGNOSIS — Z91018 Allergy to other foods: Secondary | ICD-10-CM

## 2021-06-19 DIAGNOSIS — G9349 Other encephalopathy: Secondary | ICD-10-CM | POA: Diagnosis present

## 2021-06-19 DIAGNOSIS — M6282 Rhabdomyolysis: Secondary | ICD-10-CM | POA: Diagnosis not present

## 2021-06-19 DIAGNOSIS — E876 Hypokalemia: Secondary | ICD-10-CM | POA: Diagnosis present

## 2021-06-19 DIAGNOSIS — Z888 Allergy status to other drugs, medicaments and biological substances status: Secondary | ICD-10-CM

## 2021-06-19 DIAGNOSIS — G43909 Migraine, unspecified, not intractable, without status migrainosus: Secondary | ICD-10-CM | POA: Diagnosis present

## 2021-06-19 DIAGNOSIS — Z9114 Patient's other noncompliance with medication regimen: Secondary | ICD-10-CM

## 2021-06-19 DIAGNOSIS — R410 Disorientation, unspecified: Secondary | ICD-10-CM | POA: Diagnosis present

## 2021-06-19 DIAGNOSIS — Z887 Allergy status to serum and vaccine status: Secondary | ICD-10-CM

## 2021-06-19 DIAGNOSIS — T50996A Underdosing of other drugs, medicaments and biological substances, initial encounter: Secondary | ICD-10-CM | POA: Diagnosis present

## 2021-06-19 DIAGNOSIS — R748 Abnormal levels of other serum enzymes: Secondary | ICD-10-CM | POA: Diagnosis present

## 2021-06-19 DIAGNOSIS — Z91011 Allergy to milk products: Secondary | ICD-10-CM

## 2021-06-19 DIAGNOSIS — G928 Other toxic encephalopathy: Secondary | ICD-10-CM | POA: Diagnosis present

## 2021-06-19 DIAGNOSIS — Z79899 Other long term (current) drug therapy: Secondary | ICD-10-CM

## 2021-06-19 DIAGNOSIS — D510 Vitamin B12 deficiency anemia due to intrinsic factor deficiency: Secondary | ICD-10-CM | POA: Diagnosis present

## 2021-06-19 DIAGNOSIS — Z803 Family history of malignant neoplasm of breast: Secondary | ICD-10-CM

## 2021-06-19 DIAGNOSIS — Z886 Allergy status to analgesic agent status: Secondary | ICD-10-CM

## 2021-06-19 DIAGNOSIS — G9340 Encephalopathy, unspecified: Secondary | ICD-10-CM | POA: Diagnosis not present

## 2021-06-19 DIAGNOSIS — T424X1A Poisoning by benzodiazepines, accidental (unintentional), initial encounter: Secondary | ICD-10-CM | POA: Diagnosis present

## 2021-06-19 DIAGNOSIS — T426X6A Underdosing of other antiepileptic and sedative-hypnotic drugs, initial encounter: Secondary | ICD-10-CM | POA: Diagnosis present

## 2021-06-19 DIAGNOSIS — G40909 Epilepsy, unspecified, not intractable, without status epilepticus: Principal | ICD-10-CM | POA: Diagnosis present

## 2021-06-19 DIAGNOSIS — Z885 Allergy status to narcotic agent status: Secondary | ICD-10-CM

## 2021-06-19 DIAGNOSIS — J45909 Unspecified asthma, uncomplicated: Secondary | ICD-10-CM | POA: Diagnosis present

## 2021-06-19 DIAGNOSIS — Z20822 Contact with and (suspected) exposure to covid-19: Secondary | ICD-10-CM | POA: Diagnosis present

## 2021-06-20 ENCOUNTER — Other Ambulatory Visit: Payer: Self-pay

## 2021-06-20 ENCOUNTER — Emergency Department (HOSPITAL_COMMUNITY): Payer: No Typology Code available for payment source

## 2021-06-20 ENCOUNTER — Inpatient Hospital Stay (HOSPITAL_COMMUNITY): Payer: No Typology Code available for payment source

## 2021-06-20 ENCOUNTER — Inpatient Hospital Stay (HOSPITAL_COMMUNITY)
Admission: EM | Admit: 2021-06-20 | Discharge: 2021-06-27 | DRG: 100 | Disposition: A | Discharge status: Home / Self Care | Payer: No Typology Code available for payment source | Attending: Internal Medicine | Admitting: Internal Medicine

## 2021-06-20 ENCOUNTER — Encounter (HOSPITAL_COMMUNITY): Payer: Self-pay

## 2021-06-20 ENCOUNTER — Inpatient Hospital Stay (HOSPITAL_COMMUNITY)
Admit: 2021-06-20 | Discharge: 2021-06-20 | Disposition: A | Discharge status: Home / Self Care | Payer: No Typology Code available for payment source | Attending: Internal Medicine | Admitting: Internal Medicine

## 2021-06-20 DIAGNOSIS — Z9114 Patient's other noncompliance with medication regimen: Secondary | ICD-10-CM | POA: Diagnosis not present

## 2021-06-20 DIAGNOSIS — Z885 Allergy status to narcotic agent status: Secondary | ICD-10-CM | POA: Diagnosis not present

## 2021-06-20 DIAGNOSIS — Z79899 Other long term (current) drug therapy: Secondary | ICD-10-CM | POA: Diagnosis not present

## 2021-06-20 DIAGNOSIS — Z887 Allergy status to serum and vaccine status: Secondary | ICD-10-CM | POA: Diagnosis not present

## 2021-06-20 DIAGNOSIS — D72828 Other elevated white blood cell count: Secondary | ICD-10-CM | POA: Diagnosis not present

## 2021-06-20 DIAGNOSIS — J45909 Unspecified asthma, uncomplicated: Secondary | ICD-10-CM | POA: Diagnosis present

## 2021-06-20 DIAGNOSIS — Z91011 Allergy to milk products: Secondary | ICD-10-CM | POA: Diagnosis not present

## 2021-06-20 DIAGNOSIS — R748 Abnormal levels of other serum enzymes: Secondary | ICD-10-CM | POA: Diagnosis present

## 2021-06-20 DIAGNOSIS — Z91018 Allergy to other foods: Secondary | ICD-10-CM | POA: Diagnosis not present

## 2021-06-20 DIAGNOSIS — G928 Other toxic encephalopathy: Secondary | ICD-10-CM | POA: Diagnosis present

## 2021-06-20 DIAGNOSIS — E876 Hypokalemia: Secondary | ICD-10-CM

## 2021-06-20 DIAGNOSIS — R4182 Altered mental status, unspecified: Secondary | ICD-10-CM

## 2021-06-20 DIAGNOSIS — D510 Vitamin B12 deficiency anemia due to intrinsic factor deficiency: Secondary | ICD-10-CM | POA: Diagnosis present

## 2021-06-20 DIAGNOSIS — Z803 Family history of malignant neoplasm of breast: Secondary | ICD-10-CM | POA: Diagnosis not present

## 2021-06-20 DIAGNOSIS — Z91010 Allergy to peanuts: Secondary | ICD-10-CM | POA: Diagnosis not present

## 2021-06-20 DIAGNOSIS — E538 Deficiency of other specified B group vitamins: Secondary | ICD-10-CM | POA: Diagnosis not present

## 2021-06-20 DIAGNOSIS — G9349 Other encephalopathy: Secondary | ICD-10-CM | POA: Diagnosis present

## 2021-06-20 DIAGNOSIS — J690 Pneumonitis due to inhalation of food and vomit: Secondary | ICD-10-CM | POA: Diagnosis not present

## 2021-06-20 DIAGNOSIS — G9340 Encephalopathy, unspecified: Secondary | ICD-10-CM

## 2021-06-20 DIAGNOSIS — G43909 Migraine, unspecified, not intractable, without status migrainosus: Secondary | ICD-10-CM | POA: Diagnosis present

## 2021-06-20 DIAGNOSIS — G40909 Epilepsy, unspecified, not intractable, without status epilepticus: Principal | ICD-10-CM

## 2021-06-20 DIAGNOSIS — M6282 Rhabdomyolysis: Secondary | ICD-10-CM | POA: Diagnosis not present

## 2021-06-20 DIAGNOSIS — T426X6A Underdosing of other antiepileptic and sedative-hypnotic drugs, initial encounter: Secondary | ICD-10-CM | POA: Diagnosis present

## 2021-06-20 DIAGNOSIS — Z20822 Contact with and (suspected) exposure to covid-19: Secondary | ICD-10-CM | POA: Diagnosis present

## 2021-06-20 DIAGNOSIS — R509 Fever, unspecified: Secondary | ICD-10-CM

## 2021-06-20 DIAGNOSIS — G40919 Epilepsy, unspecified, intractable, without status epilepticus: Secondary | ICD-10-CM | POA: Diagnosis not present

## 2021-06-20 DIAGNOSIS — Z888 Allergy status to other drugs, medicaments and biological substances status: Secondary | ICD-10-CM | POA: Diagnosis not present

## 2021-06-20 DIAGNOSIS — R4781 Slurred speech: Secondary | ICD-10-CM | POA: Diagnosis present

## 2021-06-20 DIAGNOSIS — R410 Disorientation, unspecified: Secondary | ICD-10-CM | POA: Diagnosis present

## 2021-06-20 DIAGNOSIS — T50996A Underdosing of other drugs, medicaments and biological substances, initial encounter: Secondary | ICD-10-CM | POA: Diagnosis present

## 2021-06-20 DIAGNOSIS — Z886 Allergy status to analgesic agent status: Secondary | ICD-10-CM | POA: Diagnosis not present

## 2021-06-20 LAB — SALICYLATE LEVEL
Salicylate Lvl: <7 mg/dL — ABNORMAL LOW (ref 7.0–30.0)
Salicylate Lvl: <7 mg/dL — ABNORMAL LOW (ref 7.0–30.0)

## 2021-06-20 LAB — BASIC METABOLIC PANEL
Anion gap: 7 (ref 5–15)
BUN: 16 mg/dL (ref 6–20)
CO2: 21 mmol/L — ABNORMAL LOW (ref 22–32)
Calcium: 8.4 mg/dL — ABNORMAL LOW (ref 8.9–10.3)
Chloride: 107 mmol/L (ref 98–111)
Creatinine, Ser: 0.93 mg/dL (ref 0.61–1.24)
GFR, Estimated: >60 mL/min (ref >60)
Glucose, Bld: 98 mg/dL (ref 70–99)
Potassium: 3.6 mmol/L (ref 3.5–5.1)
Sodium: 135 mmol/L (ref 135–145)

## 2021-06-20 LAB — URINALYSIS, ROUTINE W REFLEX MICROSCOPIC
Bacteria, UA: NONE SEEN
Bilirubin Urine: NEGATIVE
Glucose, UA: NEGATIVE mg/dL
Ketones, ur: 20 mg/dL — AB
Leukocytes,Ua: NEGATIVE
Nitrite: NEGATIVE
Protein, ur: 30 mg/dL — AB
Specific Gravity, Urine: 1.013 (ref 1.005–1.030)
pH: 5 (ref 5.0–8.0)

## 2021-06-20 LAB — CSF CELL COUNT WITH DIFFERENTIAL
RBC Count, CSF: 29 /mm3 — ABNORMAL HIGH
RBC Count, CSF: 31 /mm3 — ABNORMAL HIGH
Tube #: 1
Tube #: 4
WBC, CSF: 1 /mm3 (ref 0–5)
WBC, CSF: 1 /mm3 (ref 0–5)

## 2021-06-20 LAB — RAPID URINE DRUG SCREEN, HOSP PERFORMED
Amphetamines: NOT DETECTED
Barbiturates: NOT DETECTED
Benzodiazepines: NOT DETECTED
Cocaine: NOT DETECTED
Opiates: NOT DETECTED
Tetrahydrocannabinol: NOT DETECTED

## 2021-06-20 LAB — RESPIRATORY PANEL BY PCR

## 2021-06-20 LAB — CBC WITH DIFFERENTIAL/PLATELET
Abs Immature Granulocytes: 0.06 10*3/uL (ref 0.00–0.07)
Abs Immature Granulocytes: 0.02 10*3/uL (ref 0.00–0.07)
Basophils Absolute: 0 10*3/uL (ref 0.0–0.1)
Basophils Absolute: 0 10*3/uL (ref 0.0–0.1)
Basophils Relative: 0 %
Basophils Relative: 0 %
Eosinophils Absolute: 0 10*3/uL (ref 0.0–0.5)
Eosinophils Absolute: 0 10*3/uL (ref 0.0–0.5)
Eosinophils Relative: 0 %
Eosinophils Relative: 0 %
HCT: 44.5 % (ref 39.0–52.0)
HCT: 35.6 % — ABNORMAL LOW (ref 39.0–52.0)
Hemoglobin: 15.4 g/dL (ref 13.0–17.0)
Hemoglobin: 12.3 g/dL — ABNORMAL LOW (ref 13.0–17.0)
Immature Granulocytes: 0 %
Immature Granulocytes: 0 %
Lymphocytes Relative: 3 %
Lymphocytes Relative: 14 %
Lymphs Abs: 0.5 10*3/uL — ABNORMAL LOW (ref 0.7–4.0)
Lymphs Abs: 1.8 10*3/uL (ref 0.7–4.0)
MCH: 29 pg (ref 26.0–34.0)
MCH: 29 pg (ref 26.0–34.0)
MCHC: 34.6 g/dL (ref 30.0–36.0)
MCHC: 34.6 g/dL (ref 30.0–36.0)
MCV: 83.8 fL (ref 80.0–100.0)
MCV: 84 fL (ref 80.0–100.0)
Monocytes Absolute: 0.8 10*3/uL (ref 0.1–1.0)
Monocytes Absolute: 1.1 10*3/uL — ABNORMAL HIGH (ref 0.1–1.0)
Monocytes Relative: 5 %
Monocytes Relative: 9 %
Neutro Abs: 13.6 10*3/uL — ABNORMAL HIGH (ref 1.7–7.7)
Neutro Abs: 9.8 10*3/uL — ABNORMAL HIGH (ref 1.7–7.7)
Neutrophils Relative %: 92 %
Neutrophils Relative %: 77 %
Platelets: 210 10*3/uL (ref 150–400)
Platelets: 176 10*3/uL (ref 150–400)
RBC: 5.31 MIL/uL (ref 4.22–5.81)
RBC: 4.24 MIL/uL (ref 4.22–5.81)
RDW: 12.2 % (ref 11.5–15.5)
RDW: 12.2 % (ref 11.5–15.5)
WBC: 14.9 10*3/uL — ABNORMAL HIGH (ref 4.0–10.5)
WBC: 12.7 10*3/uL — ABNORMAL HIGH (ref 4.0–10.5)
nRBC: 0 % (ref 0.0–0.2)
nRBC: 0 % (ref 0.0–0.2)

## 2021-06-20 LAB — GLUCOSE, CAPILLARY: Glucose-Capillary: 75 mg/dL (ref 70–99)

## 2021-06-20 LAB — HEPATIC FUNCTION PANEL
ALT: 28 U/L (ref 0–44)
AST: 33 U/L (ref 15–41)
Albumin: 3.9 g/dL (ref 3.5–5.0)
Alkaline Phosphatase: 62 U/L (ref 38–126)
Bilirubin, Direct: 0.1 mg/dL (ref 0.0–0.2)
Indirect Bilirubin: 0.6 mg/dL (ref 0.3–0.9)
Total Bilirubin: 0.7 mg/dL (ref 0.3–1.2)
Total Protein: 6.4 g/dL — ABNORMAL LOW (ref 6.5–8.1)

## 2021-06-20 LAB — COMPREHENSIVE METABOLIC PANEL
ALT: 43 U/L (ref 0–44)
AST: 51 U/L — ABNORMAL HIGH (ref 15–41)
Albumin: 4.8 g/dL (ref 3.5–5.0)
Alkaline Phosphatase: 80 U/L (ref 38–126)
Anion gap: 10 (ref 5–15)
BUN: 17 mg/dL (ref 6–20)
CO2: 23 mmol/L (ref 22–32)
Calcium: 9.5 mg/dL (ref 8.9–10.3)
Chloride: 105 mmol/L (ref 98–111)
Creatinine, Ser: 1.14 mg/dL (ref 0.61–1.24)
GFR, Estimated: >60 mL/min (ref >60)
Glucose, Bld: 91 mg/dL (ref 70–99)
Potassium: 4.5 mmol/L (ref 3.5–5.1)
Sodium: 138 mmol/L (ref 135–145)
Total Bilirubin: 1.1 mg/dL (ref 0.3–1.2)
Total Protein: 7.9 g/dL (ref 6.5–8.1)

## 2021-06-20 LAB — RESP PANEL BY RT-PCR (FLU A&B, COVID) ARPGX2
Influenza A by PCR: NEGATIVE
Influenza B by PCR: NEGATIVE
SARS Coronavirus 2 by RT PCR: NEGATIVE

## 2021-06-20 LAB — LACTIC ACID, PLASMA
Lactic Acid, Venous: 2.3 mmol/L (ref 0.5–1.9)
Lactic Acid, Venous: 1.3 mmol/L (ref 0.5–1.9)

## 2021-06-20 LAB — ACETAMINOPHEN LEVEL
Acetaminophen (Tylenol), Serum: <10 ug/mL — ABNORMAL LOW (ref 10–30)
Acetaminophen (Tylenol), Serum: 11 ug/mL (ref 10–30)

## 2021-06-20 LAB — GLUCOSE, CSF: Glucose, CSF: 68 mg/dL (ref 40–70)

## 2021-06-20 LAB — HIV ANTIBODY (ROUTINE TESTING W REFLEX): HIV Screen 4th Generation wRfx: NONREACTIVE

## 2021-06-20 LAB — GRAM STAIN: Gram Stain: NONE SEEN

## 2021-06-20 LAB — PROTEIN, CSF: Total  Protein, CSF: 18 mg/dL (ref 15–45)

## 2021-06-20 LAB — CBG MONITORING, ED
Glucose-Capillary: 90 mg/dL (ref 70–99)
Glucose-Capillary: 83 mg/dL (ref 70–99)

## 2021-06-20 LAB — CK: Total CK: 984 U/L — ABNORMAL HIGH (ref 49–397)

## 2021-06-20 LAB — ETHANOL: Alcohol, Ethyl (B): <10 mg/dL (ref <10)

## 2021-06-20 IMAGING — CT CT CHEST W/O CM
2 of 4 series · 15 of 36 positions shown, 18 images · non-contrast
Comparison: None.

CLINICAL DATA: Persistent cough with fever and nausea/vomiting.
Blood in the urine and leukocytosis.

EXAM:
CT CHEST, ABDOMEN AND PELVIS WITHOUT CONTRAST
TECHNIQUE: Multidetector CT imaging of the chest, abdomen and pelvis was
performed following the standard protocol without IV contrast.

[Series 3: thorax · axial · 0.59mm/px · z∈[+1420,+1652]mm · 12 of 138 slices shown, 15 images]
[im 11/138  mediastinal]
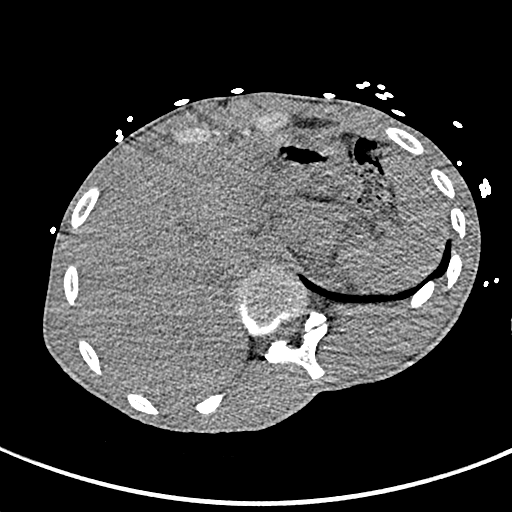
[im 11/138  lung]
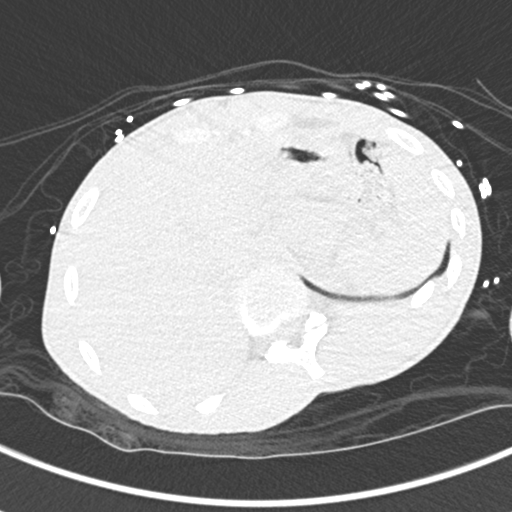
[im 22/138  lung]
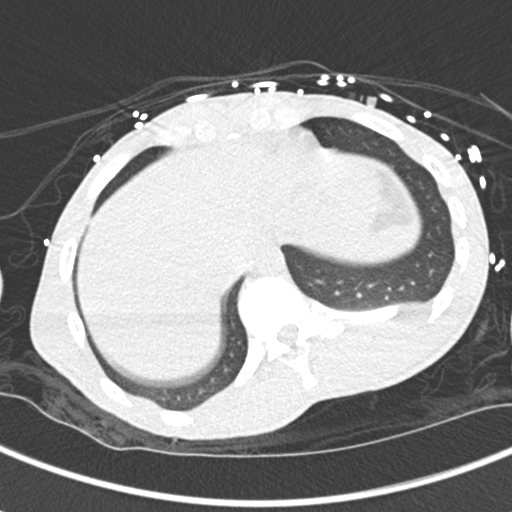
[im 32/138  lung]
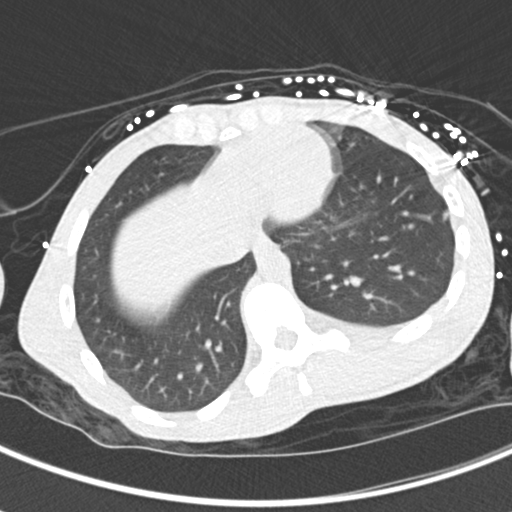
[im 43/138  lung]
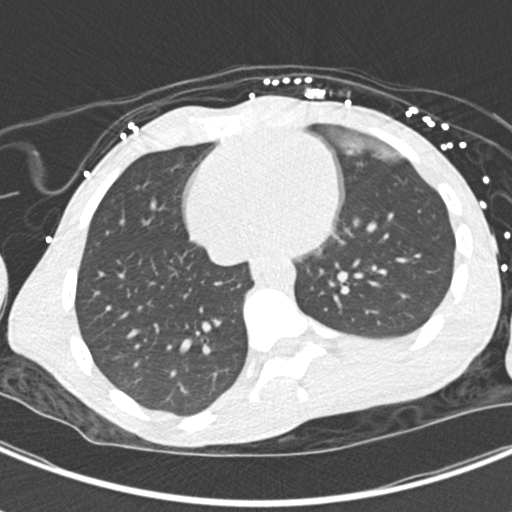
[im 53/138  mediastinal]
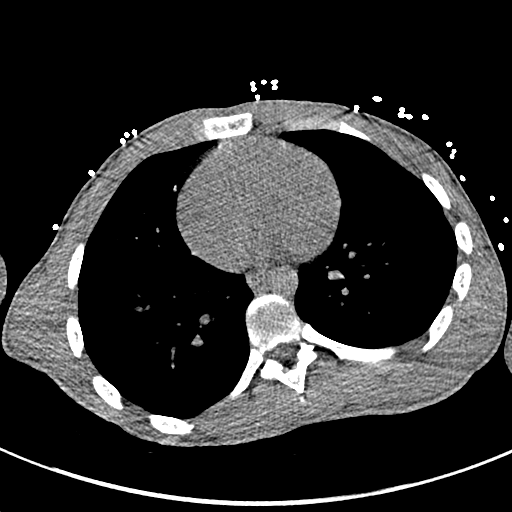
[im 53/138  lung]
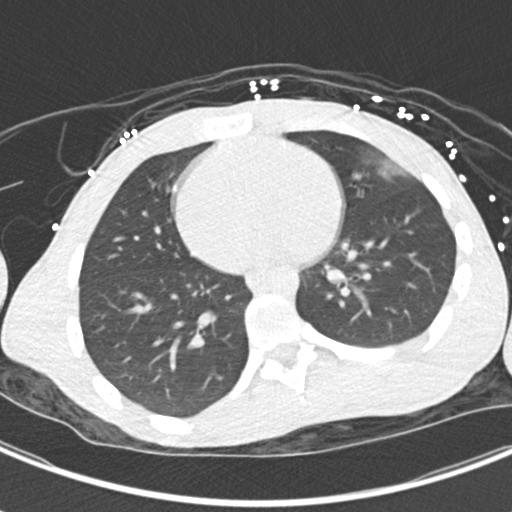
[im 64/138  lung]
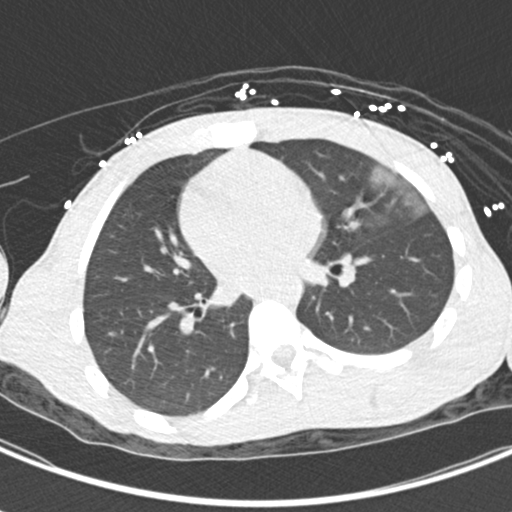
[im 74/138  lung]
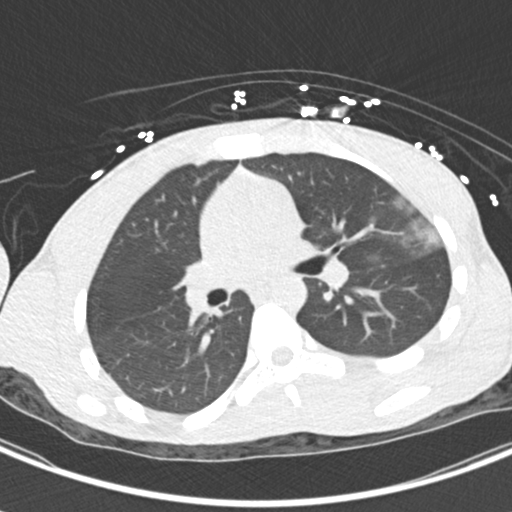
[im 85/138  lung]
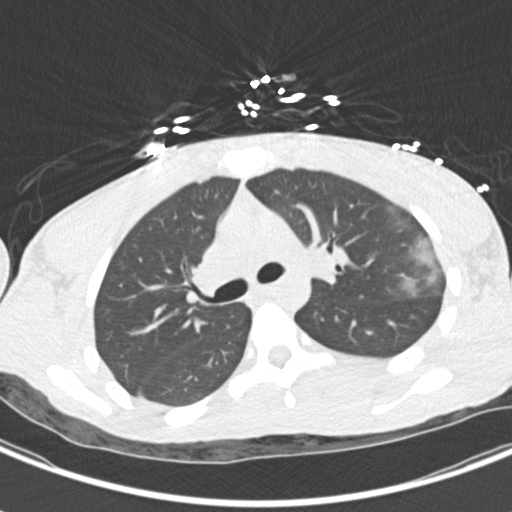
[im 95/138  mediastinal]
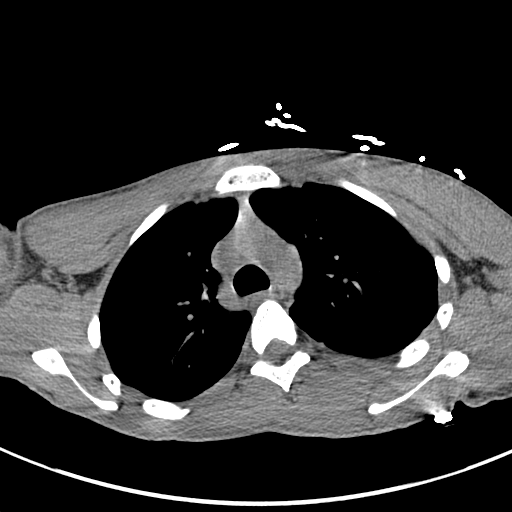
[im 95/138  lung]
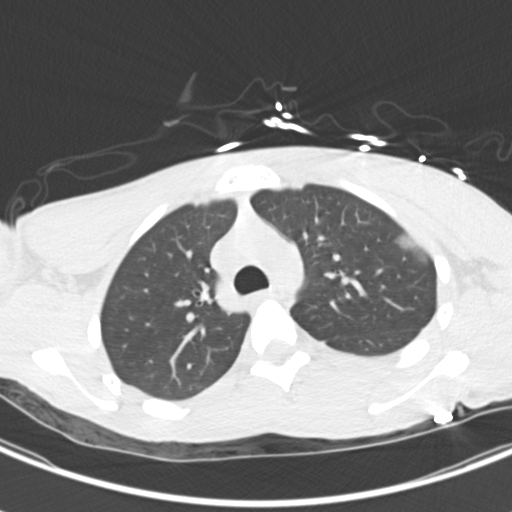
[im 106/138  lung]
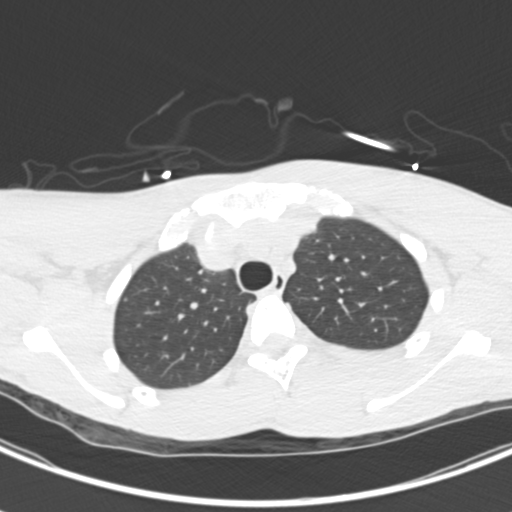
[im 116/138  lung]
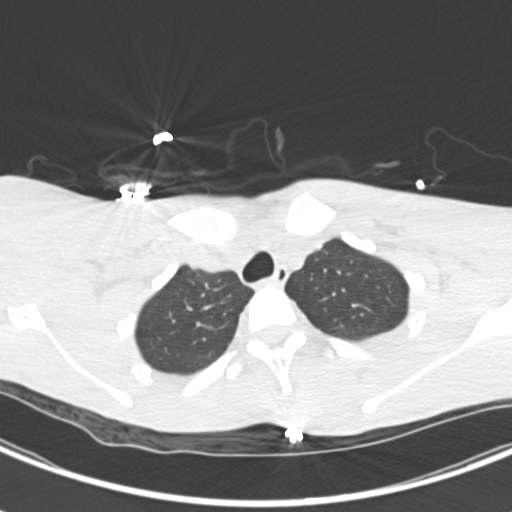
[im 127/138  lung]
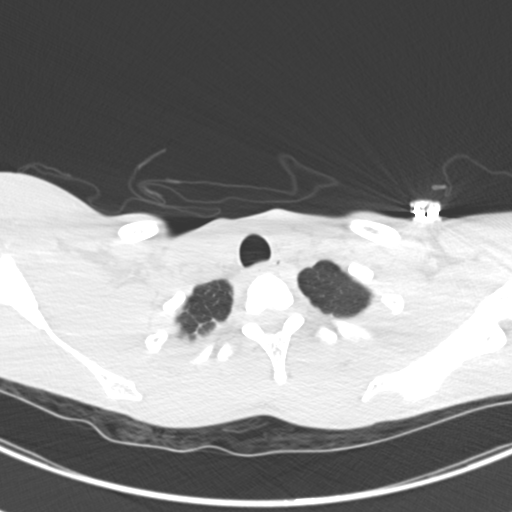

[Series 6: coronal · coronal · 0.52mm/px · 3 of 102 slices shown]
[im 21/102  lung]
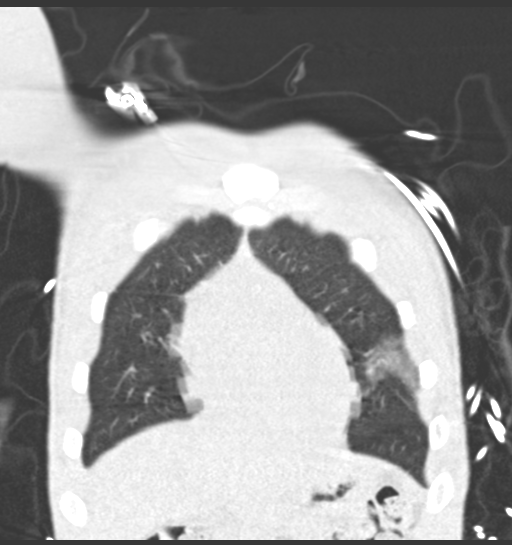
[im 41/102  lung]
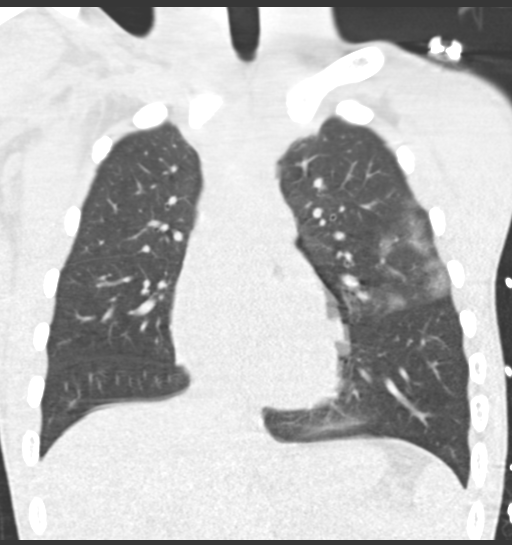
[im 61/102  lung]
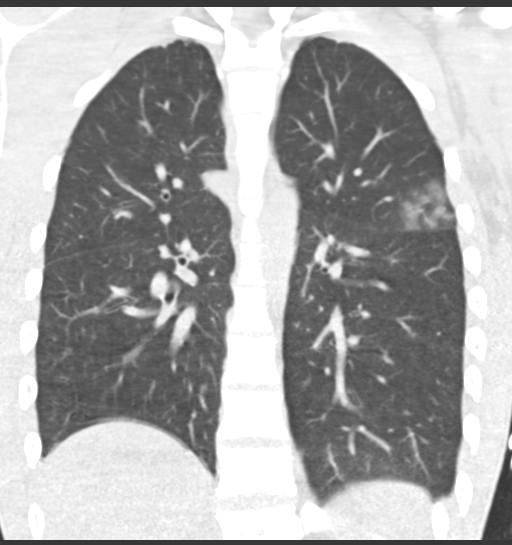

[15 of 36 positions shown; findings below may reference images not displayed]

FINDINGS: CT CHEST FINDINGS

Cardiovascular: No significant vascular findings. Normal heart size.
No pericardial effusion.

Mediastinum/Nodes: No adenopathy or visible inflammation

Lungs/Pleura: Patchy ground-glass opacity in the left upper lobe,
primarily lingular. Although peripheral, no confluent wedge-shaped
to imply vascular cause. No pulmonary edema or pleural fluid.

Musculoskeletal: No acute finding

CT ABDOMEN PELVIS FINDINGS

Limited by lack of intra-abdominal fat hand low-dose noncontrast
technique.

Hepatobiliary: No focal liver abnormality.No evidence of biliary
obstruction or stone.

Pancreas: Unremarkable.

Spleen: Unremarkable.

Adrenals/Urinary Tract: Negative adrenals. No hydronephrosis or
stone. Unremarkable bladder.

Stomach/Bowel: Indistinct bowel and mesenteric fat in the right
abdomen is likely from streak artifact from adjacent EKG pad. No
convincing bowel wall thickening. No pericecal inflammatory changes.
Moderate stool volume.

Vascular/Lymphatic: No acute vascular abnormality. No mass or
adenopathy.

Reproductive:Negative

Other: No ascites or pneumoperitoneum.

Musculoskeletal: No acute finding. Spinal alignment which may be
related to patient positioning.
IMPRESSION: 1. Left upper lobe pneumonia.
2. No hydronephrosis or nephrolithiasis.

## 2021-06-20 IMAGING — DX DG CHEST 1V PORT
1 series · 2 of 2 positions shown · non-contrast
Comparison: [DATE]

CLINICAL DATA: Rule out aspiration.  Altered mental status.

EXAM:
PORTABLE CHEST 1 VIEW

[Series 1: chest ap · 0.14mm/px · 2 of 2 slices shown]
[im 1/2]
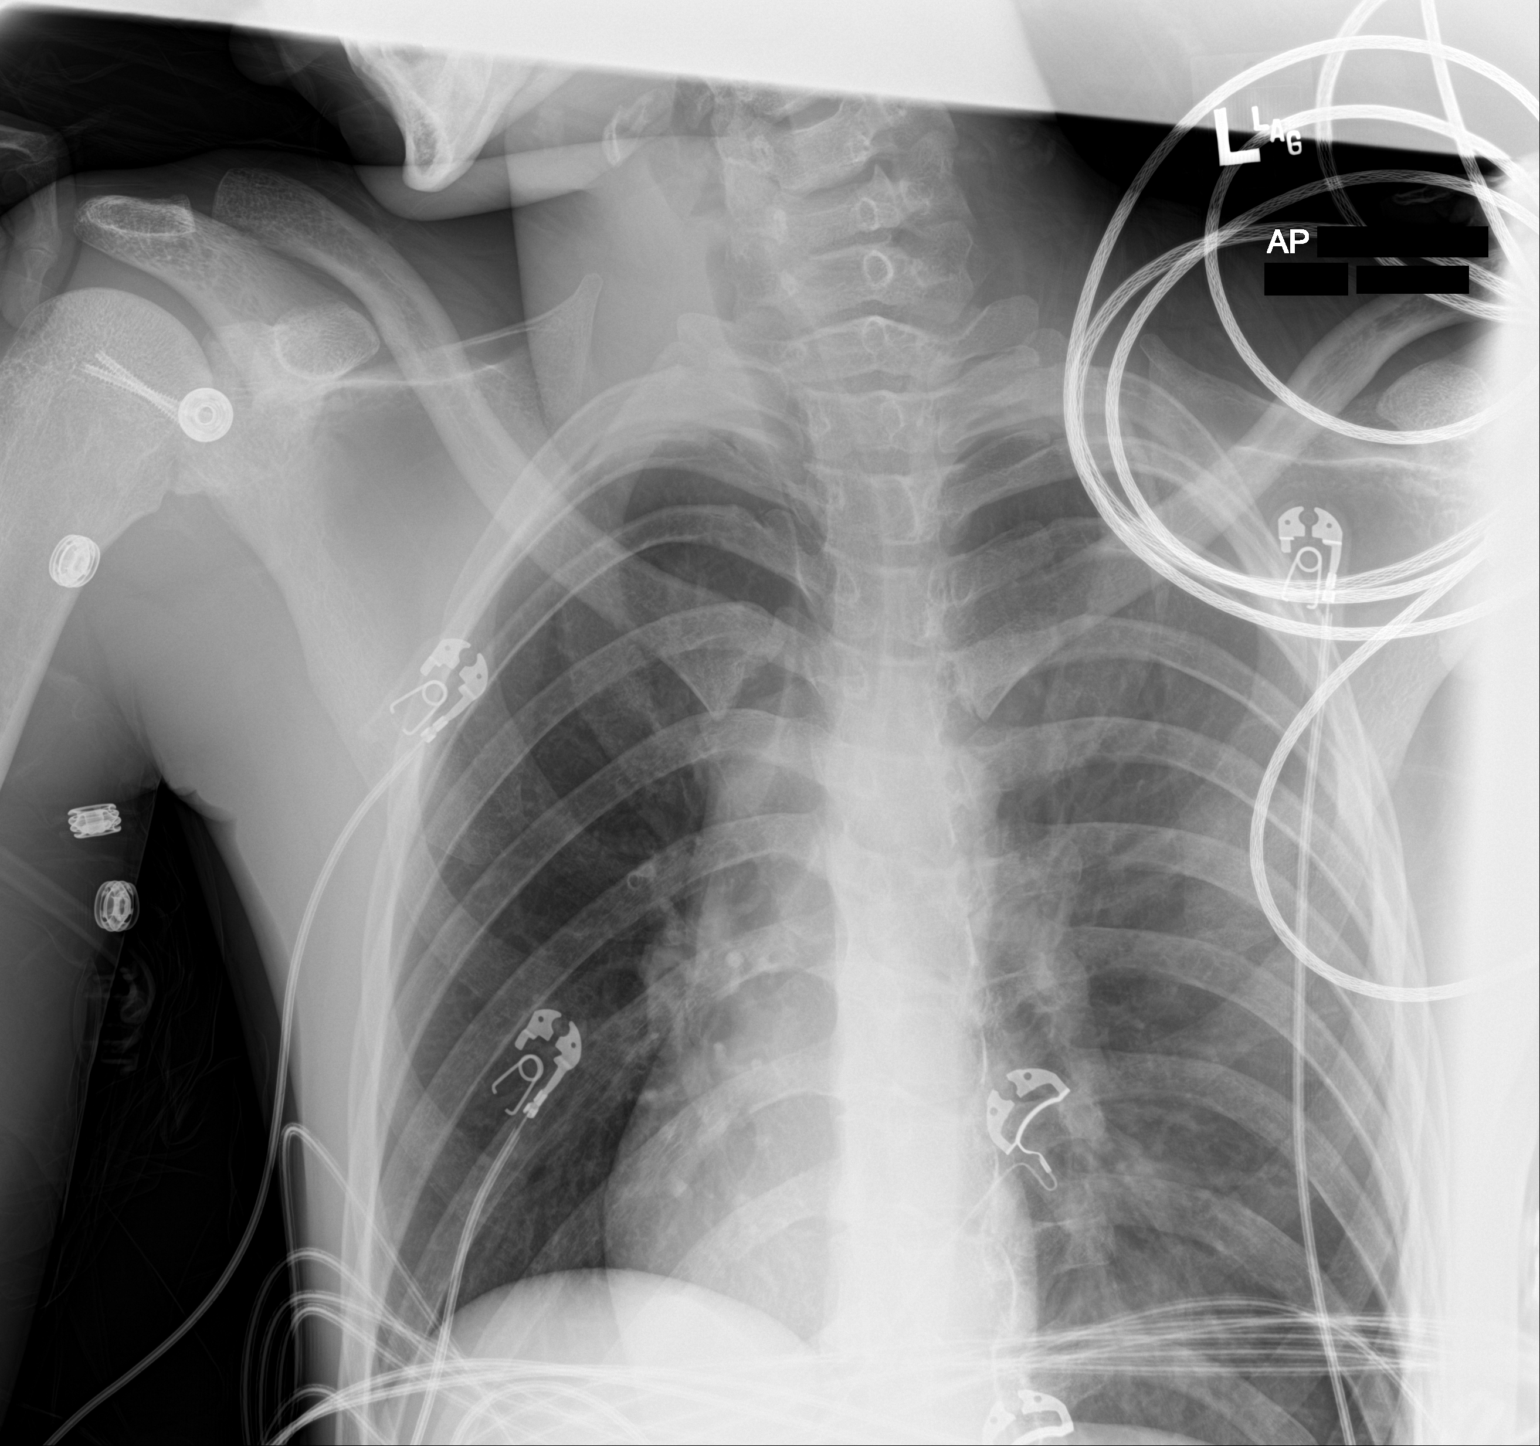
[im 2/2]
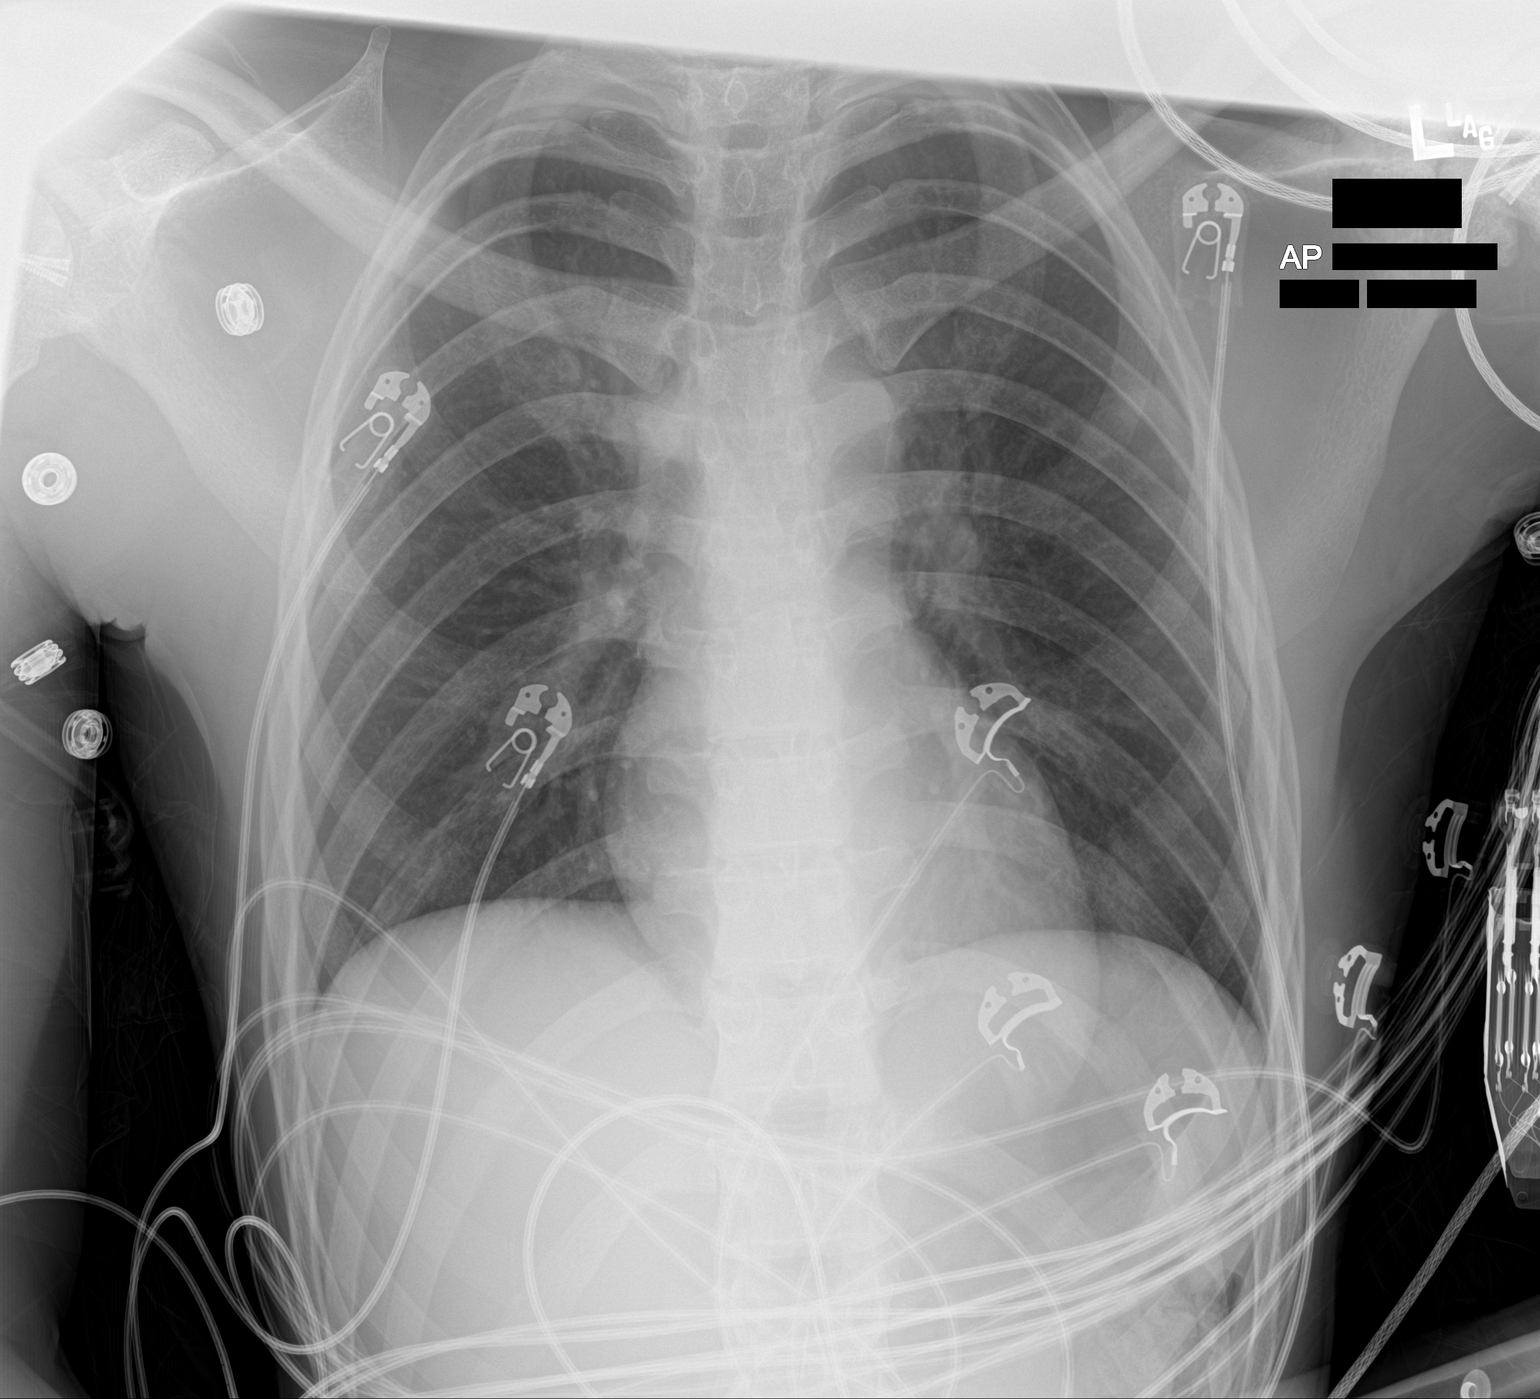

[2 of 2 positions shown; findings below may reference images not displayed]

FINDINGS: The heart size and mediastinal contours are within normal limits. No
consolidation, effusion, or pneumothorax. No acute osseous
abnormality.
IMPRESSION: No acute cardiopulmonary process.

## 2021-06-20 IMAGING — CT CT RENAL STONE PROTOCOL
1 of 2 series · 14 of 32 positions shown, 19 images · non-contrast
Comparison: None.

CLINICAL DATA: Persistent cough with fever and nausea/vomiting.
Blood in the urine and leukocytosis.

EXAM:
CT CHEST, ABDOMEN AND PELVIS WITHOUT CONTRAST
TECHNIQUE: Multidetector CT imaging of the chest, abdomen and pelvis was
performed following the standard protocol without IV contrast.

[Series 3: axial st · axial · 0.59mm/px · z∈[+1028,+1468]mm · 14 of 98 slices shown, 19 images]
[im 5/98  soft-tissue]
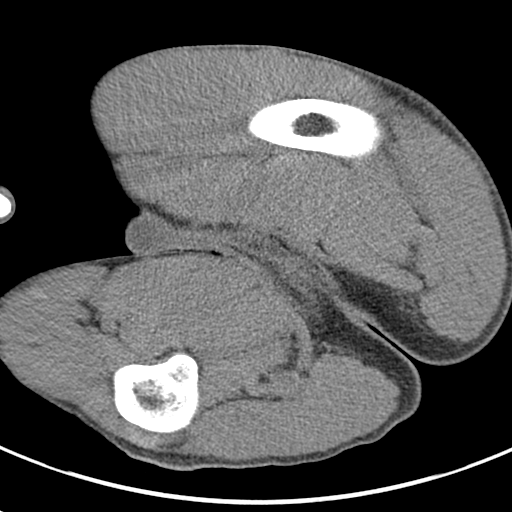
[im 5/98  bone]
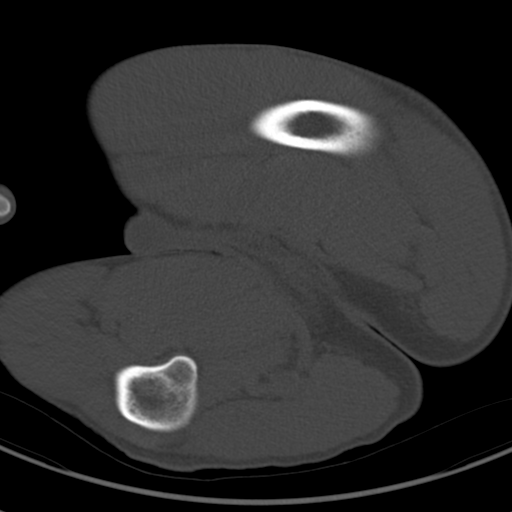
[im 14/98  soft-tissue]
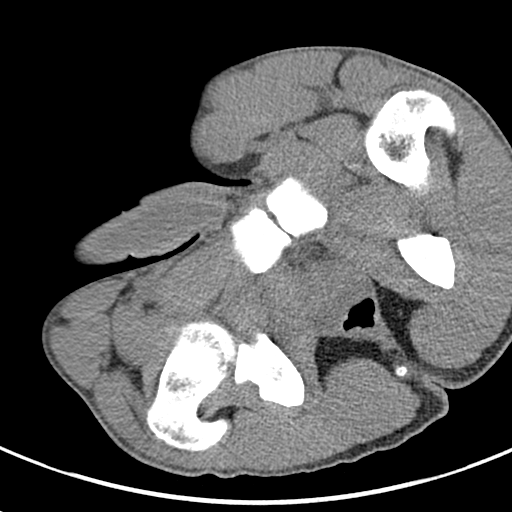
[im 19/98  soft-tissue]
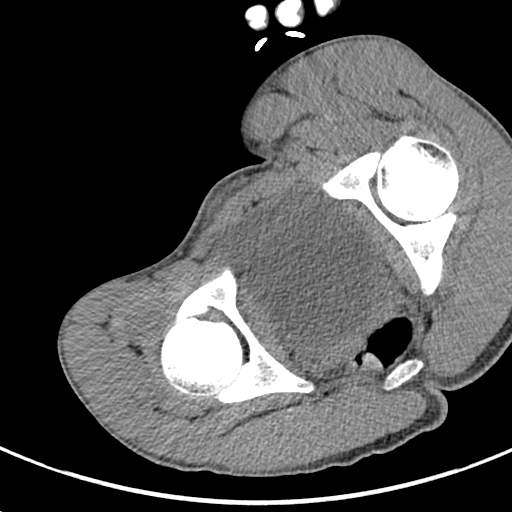
[im 28/98  soft-tissue]
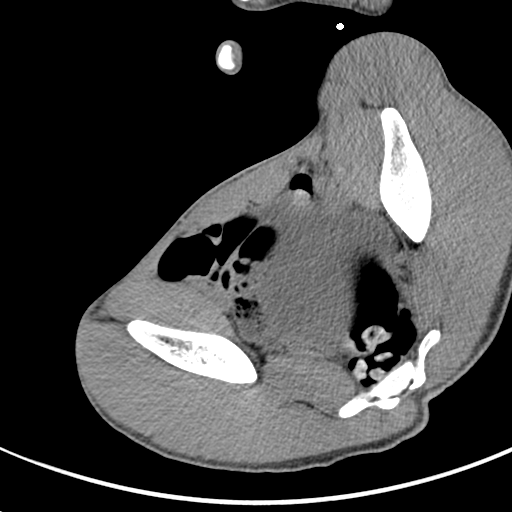
[im 33/98  soft-tissue]
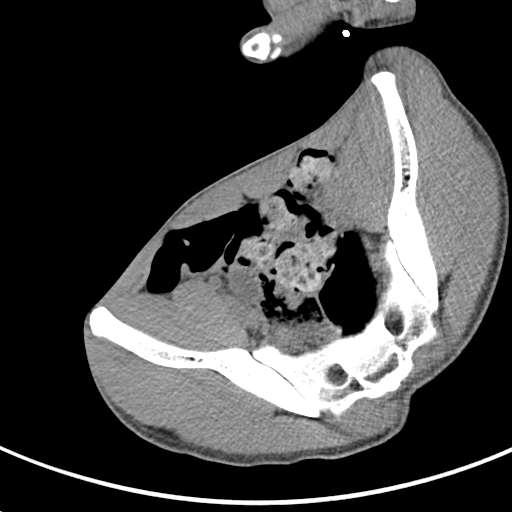
[im 42/98  soft-tissue]
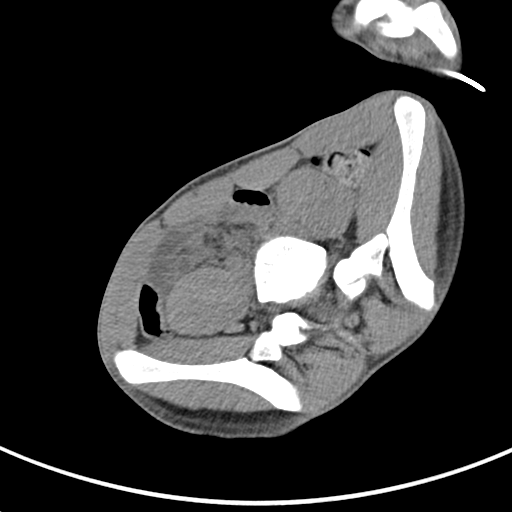
[im 51/98  soft-tissue]
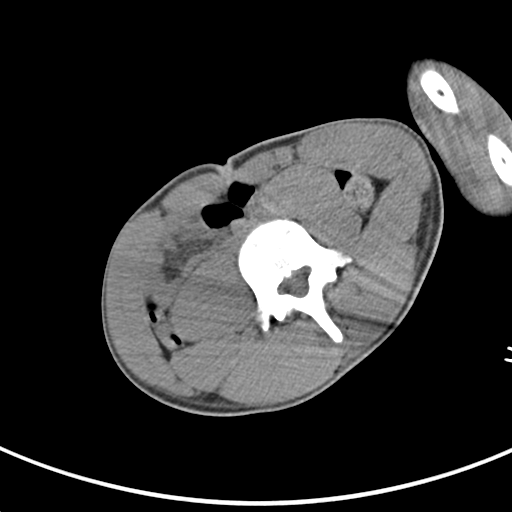
[im 56/98  soft-tissue]
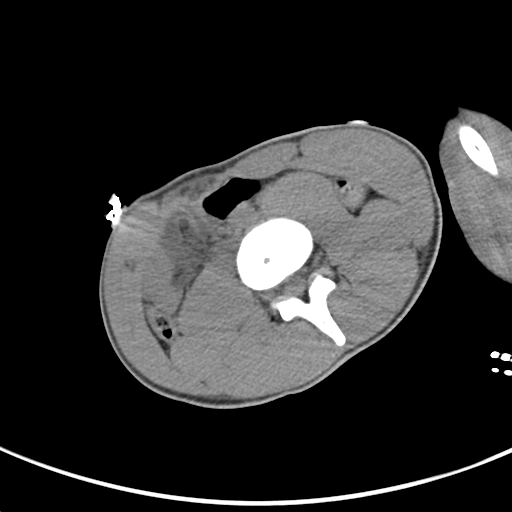
[im 65/98  soft-tissue]
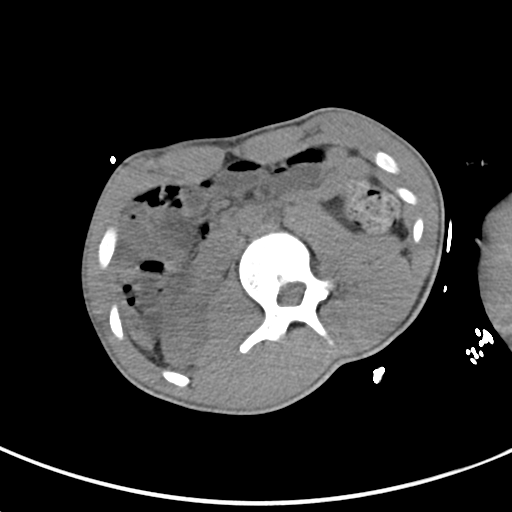
[im 65/98  bone]
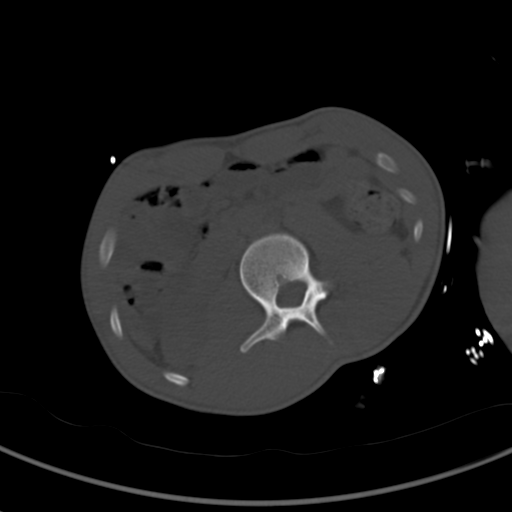
[im 70/98  soft-tissue]
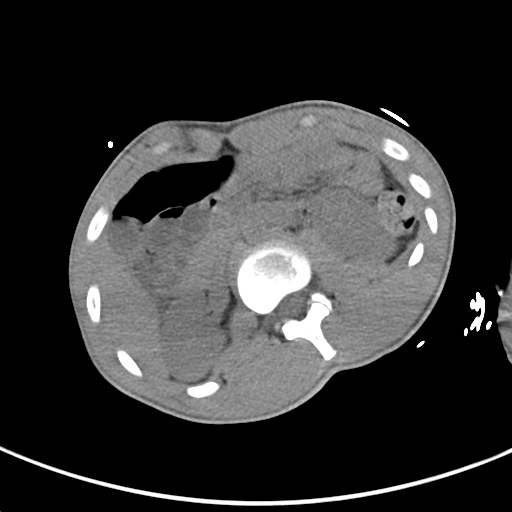
[im 79/98  soft-tissue]
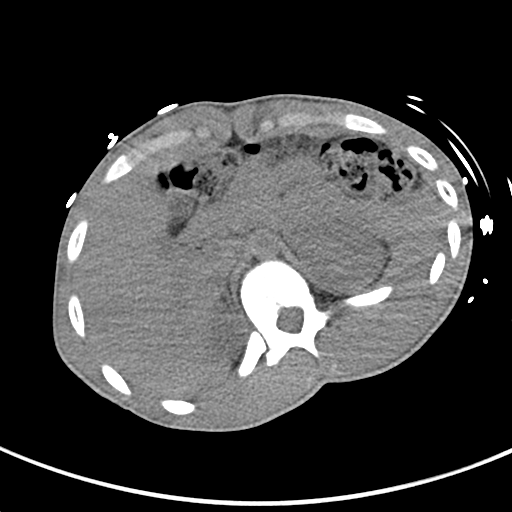
[im 79/98  lung]
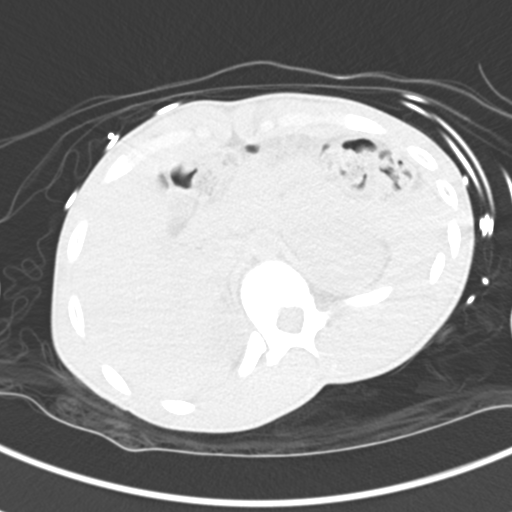
[im 84/98  soft-tissue]
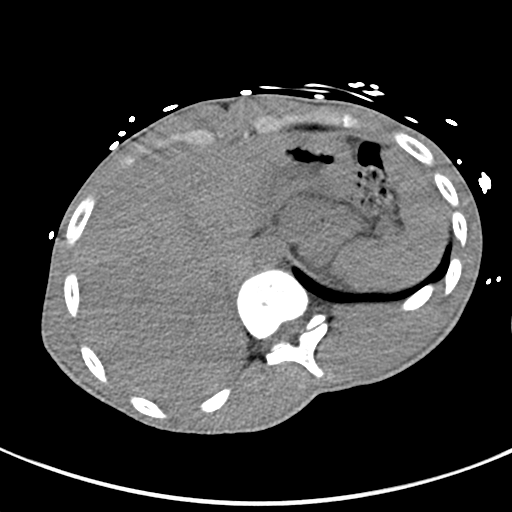
[im 84/98  lung]
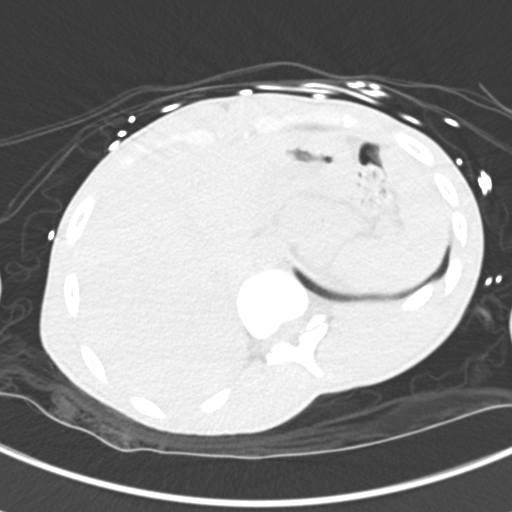
[im 88/98  lung]
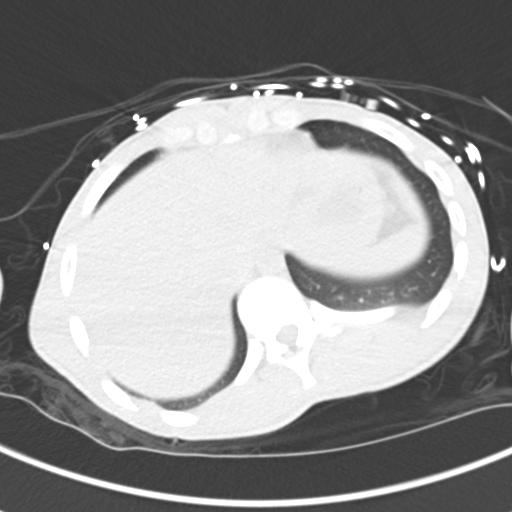
[im 93/98  soft-tissue]
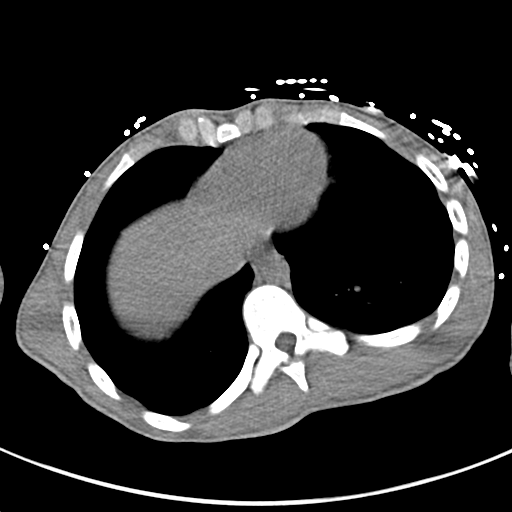
[im 93/98  lung]
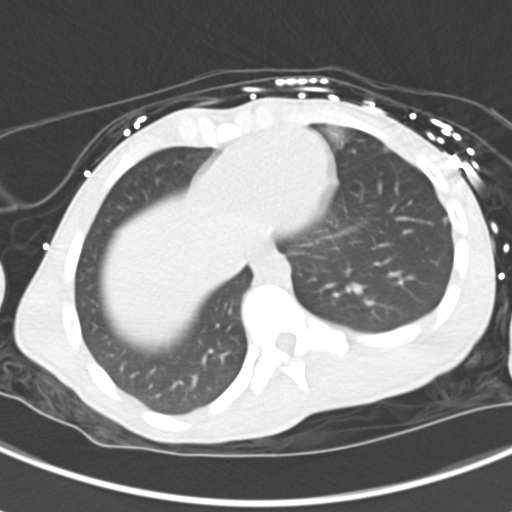

[14 of 32 positions shown; findings below may reference images not displayed]

FINDINGS: CT CHEST FINDINGS

Cardiovascular: No significant vascular findings. Normal heart size.
No pericardial effusion.

Mediastinum/Nodes: No adenopathy or visible inflammation

Lungs/Pleura: Patchy ground-glass opacity in the left upper lobe,
primarily lingular. Although peripheral, no confluent wedge-shaped
to imply vascular cause. No pulmonary edema or pleural fluid.

Musculoskeletal: No acute finding

CT ABDOMEN PELVIS FINDINGS

Limited by lack of intra-abdominal fat hand low-dose noncontrast
technique.

Hepatobiliary: No focal liver abnormality.No evidence of biliary
obstruction or stone.

Pancreas: Unremarkable.

Spleen: Unremarkable.

Adrenals/Urinary Tract: Negative adrenals. No hydronephrosis or
stone. Unremarkable bladder.

Stomach/Bowel: Indistinct bowel and mesenteric fat in the right
abdomen is likely from streak artifact from adjacent EKG pad. No
convincing bowel wall thickening. No pericecal inflammatory changes.
Moderate stool volume.

Vascular/Lymphatic: No acute vascular abnormality. No mass or
adenopathy.

Reproductive:Negative

Other: No ascites or pneumoperitoneum.

Musculoskeletal: No acute finding. Spinal alignment which may be
related to patient positioning.
IMPRESSION: 1. Left upper lobe pneumonia.
2. No hydronephrosis or nephrolithiasis.

## 2021-06-20 IMAGING — CT CT HEAD W/O CM
3 series · 15 of 47 positions shown, 18 images · non-contrast
Comparison: None.
COMPARISON: None.

Addendum:
CLINICAL DATA: Mental status change. Unknown cause. Overdose of
unknown substance suspected.

EXAM:
CT HEAD WITHOUT CONTRAST
TECHNIQUE: Contiguous axial images were obtained from the base of the skull
through the vertex without intravenous contrast.

[Series 2: head wo · axial · 0.47mm/px · z∈[-134,-9]mm · 9 of 31 slices shown, 12 images]
[im 3/31  brain]
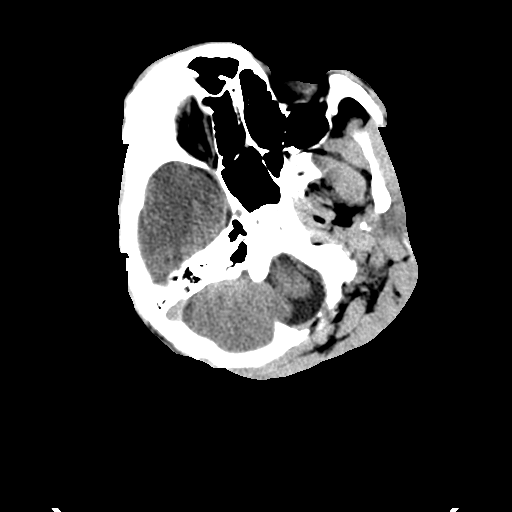
[im 3/31  bone]
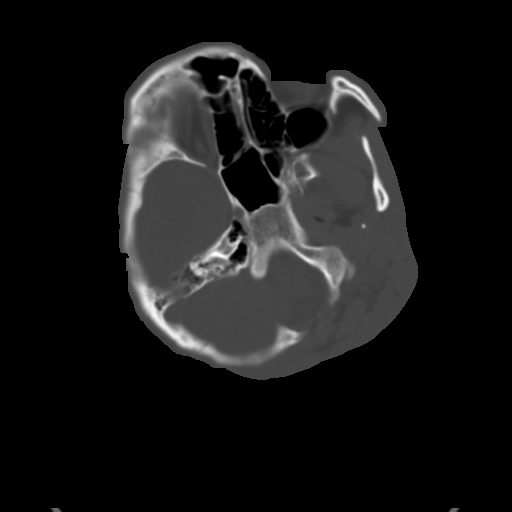
[im 6/31  brain]
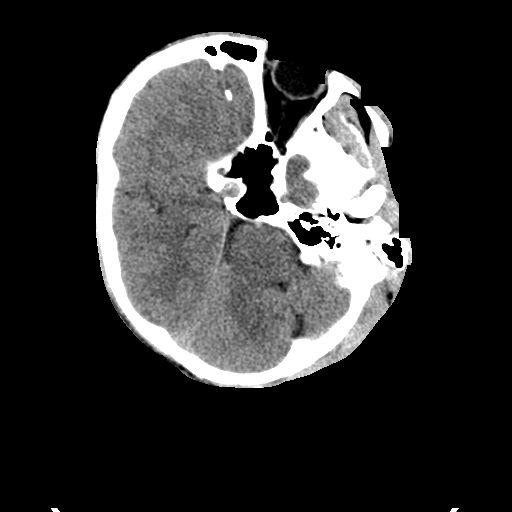
[im 9/31  brain]
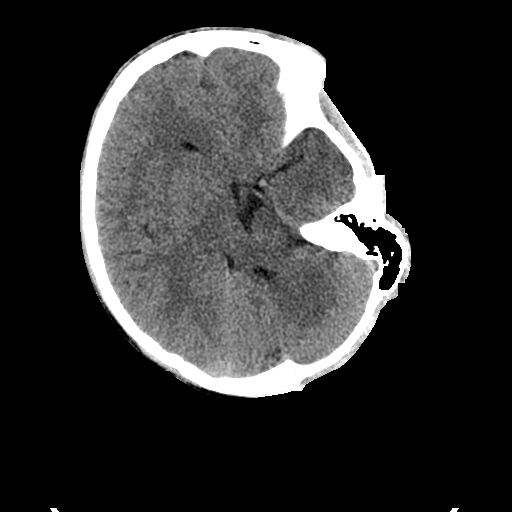
[im 12/31  brain]
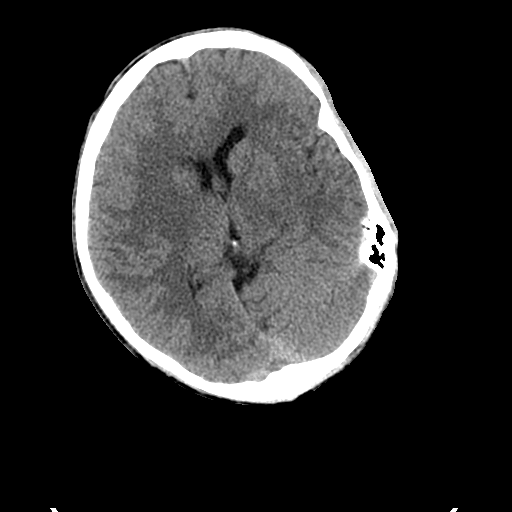
[im 16/31  brain]
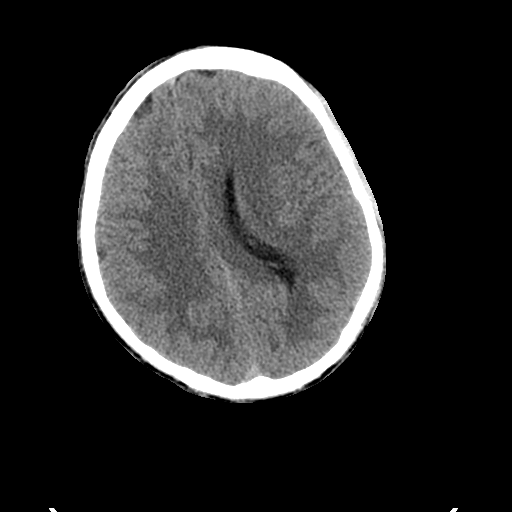
[im 16/31  bone]
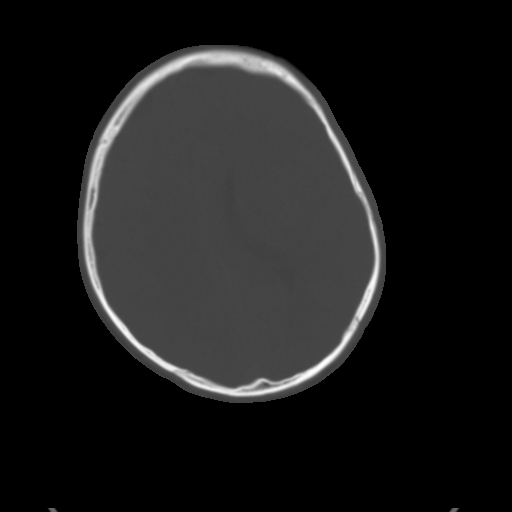
[im 19/31  brain]
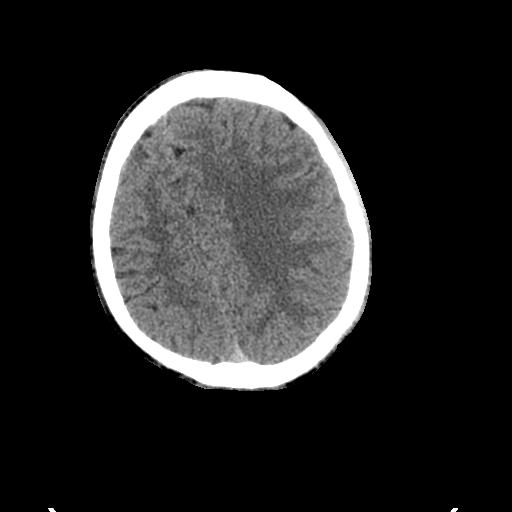
[im 22/31  brain]
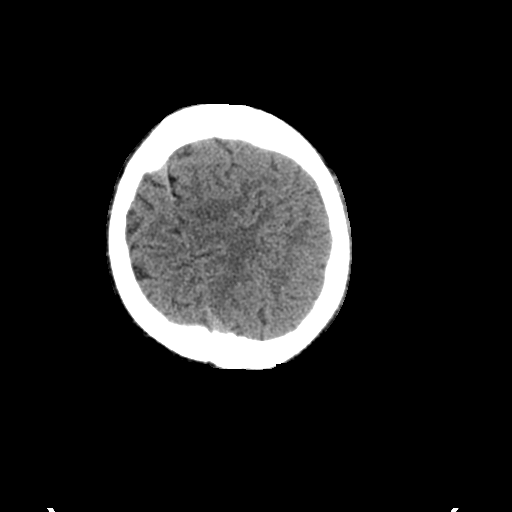
[im 25/31  brain]
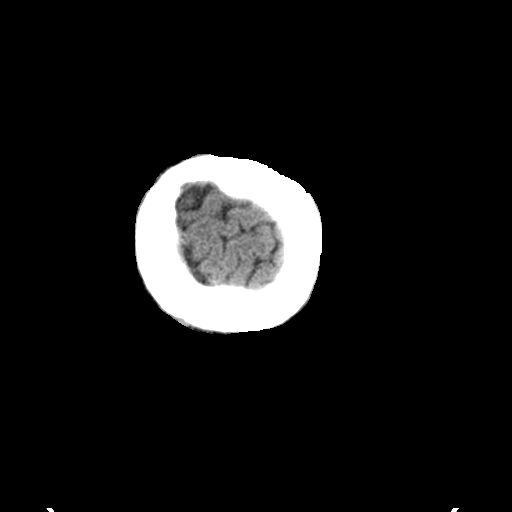
[im 28/31  brain]
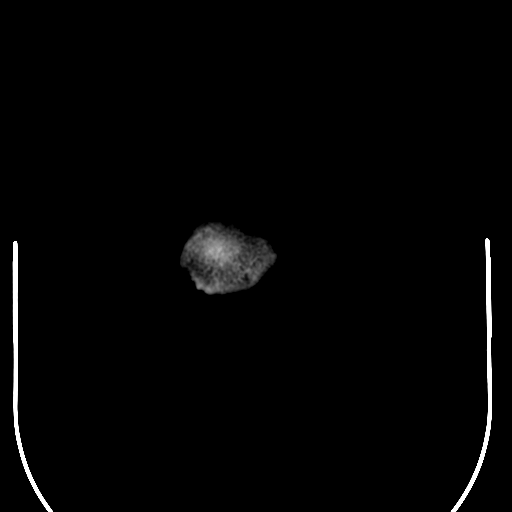
[im 28/31  bone]
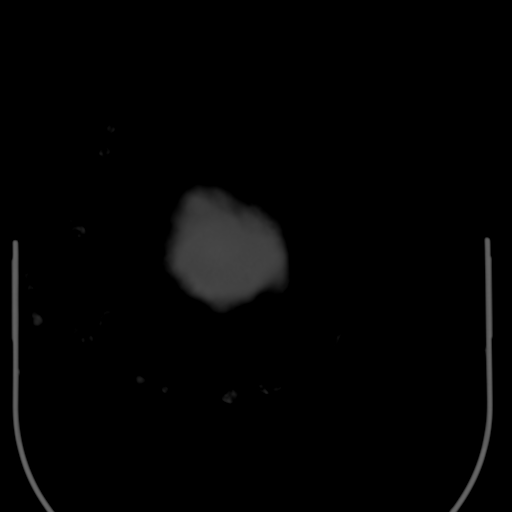

[Series 4: coronal soft tissue · coronal · 0.28mm/px · 3 of 61 slices shown]
[im 21/61  brain]
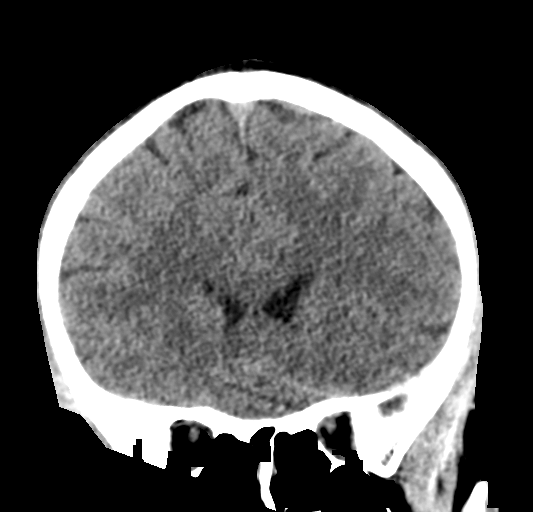
[im 27/61  brain]
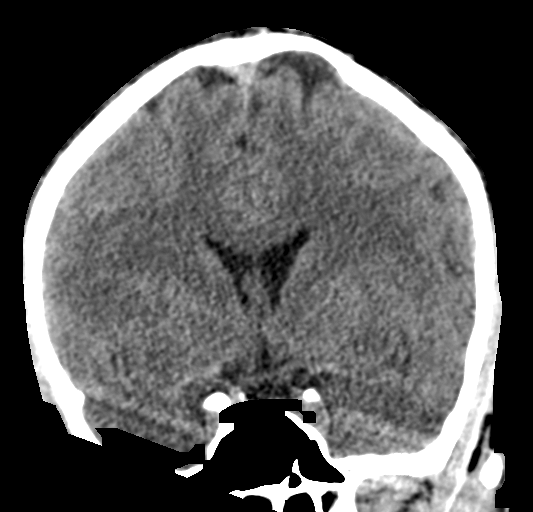
[im 34/61  brain]
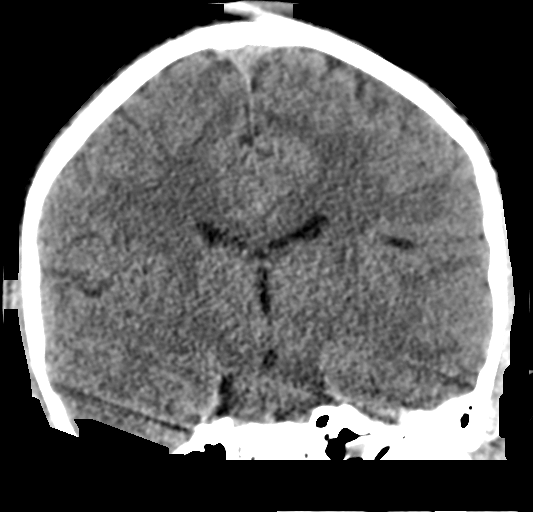

[Series 5: sagittal soft tissue · sagittal · 0.30mm/px · 3 of 51 slices shown]
[im 21/51  brain]
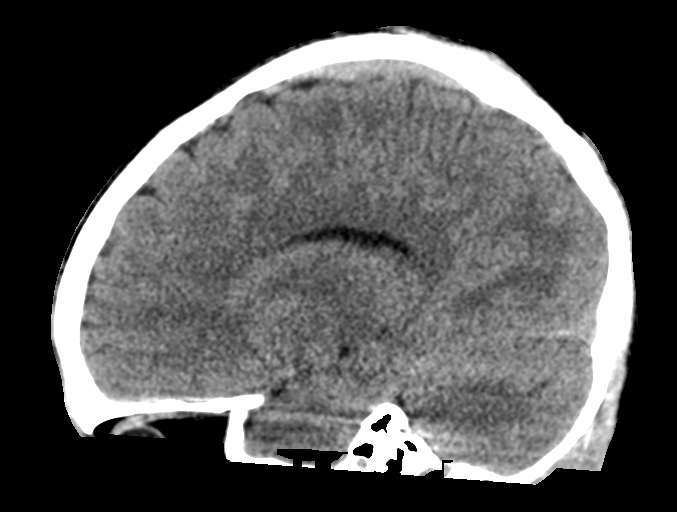
[im 26/51  brain]
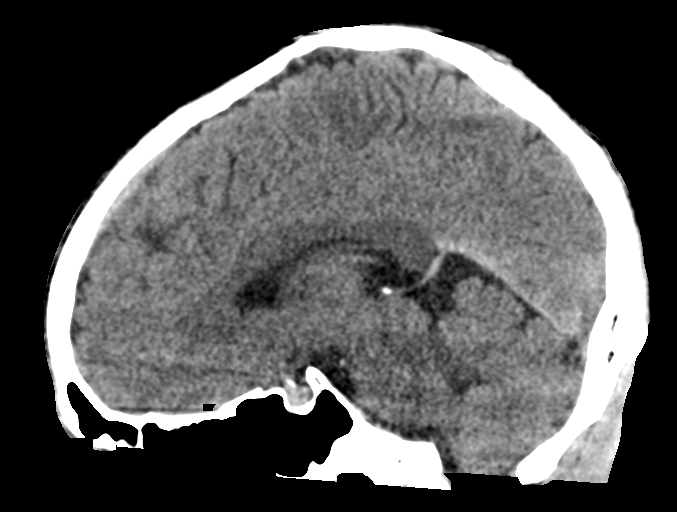
[im 30/51  brain]
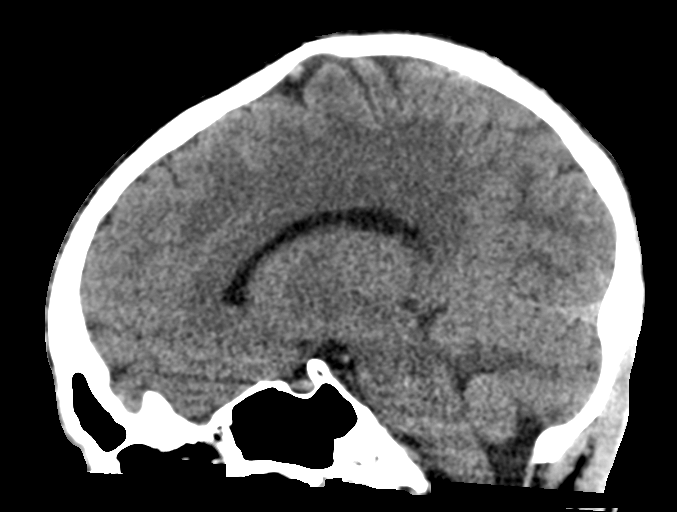

[15 of 47 positions shown; findings below may reference images not displayed]

FINDINGS: Brain:

No evidence of large-territorial acute infarction. No parenchymal
hemorrhage. No mass lesion. No extra-axial collection.

No mass effect or midline shift. No hydrocephalus. Basilar cisterns
are patent.

Vascular: No hyperdense vessel.

Skull: No acute fracture or focal lesion.

Sinuses/Orbits: Paranasal sinuses and mastoid air cells are clear.
The orbits are unremarkable.

Other: None.
IMPRESSION: Negative for acute traumatic injury.

ADDENDUM:
Basilar cisterns are patent. No mass effect. Images are obliqued on
CT axial.

These results were called by telephone at the time of interpretation
on [DATE] at [DATE] to provider Dr. ERXLEBEN, who verbally
acknowledged these results.

*** End of Addendum ***
FINDINGS: Brain:

No evidence of large-territorial acute infarction. No parenchymal
hemorrhage. No mass lesion. No extra-axial collection.

No mass effect or midline shift. No hydrocephalus. Basilar cisterns
are patent.

Vascular: No hyperdense vessel.

Skull: No acute fracture or focal lesion.

Sinuses/Orbits: Paranasal sinuses and mastoid air cells are clear.
The orbits are unremarkable.

Other: None.
IMPRESSION: Negative for acute traumatic injury.

## 2021-06-20 MED ORDER — SODIUM CHLORIDE 0.9 % IV BOLUS
1000.0000 mL | Freq: Once | INTRAVENOUS | Status: AC
  Administered 2021-06-20: 1000 mL via INTRAVENOUS

## 2021-06-20 MED ORDER — SODIUM CHLORIDE 0.9 % IV SOLN
2.0000 g | Freq: Once | INTRAVENOUS | Status: AC
  Administered 2021-06-20: 2 g via INTRAVENOUS
  Filled 2021-06-20: qty 20

## 2021-06-20 MED ORDER — SODIUM CHLORIDE 0.9 % IV SOLN
200.0000 mg | Freq: Once | INTRAVENOUS | Status: AC
  Administered 2021-06-20: 200 mg via INTRAVENOUS
  Filled 2021-06-20: qty 20

## 2021-06-20 MED ORDER — ACETAMINOPHEN 325 MG PO TABS
650.0000 mg | ORAL_TABLET | Freq: Four times a day (QID) | ORAL | Status: DC | PRN
  Administered 2021-06-21 – 2021-06-24 (×4): 650 mg via ORAL
  Filled 2021-06-20 (×4): qty 2

## 2021-06-20 MED ORDER — SODIUM CHLORIDE 0.9 % IV SOLN: 2.0000 g | Freq: Two times a day (BID) | INTRAVENOUS | Status: DC

## 2021-06-20 MED ORDER — VANCOMYCIN HCL 500 MG/100ML IV SOLN
500.0000 mg | Freq: Once | INTRAVENOUS | Status: AC
  Administered 2021-06-20: 500 mg via INTRAVENOUS
  Filled 2021-06-20: qty 100

## 2021-06-20 MED ORDER — SODIUM CHLORIDE 0.9 % IV SOLN
100.0000 mg | Freq: Two times a day (BID) | INTRAVENOUS | Status: DC
  Administered 2021-06-20: 100 mg via INTRAVENOUS
  Filled 2021-06-20 (×3): qty 10

## 2021-06-20 MED ORDER — PIPERACILLIN-TAZOBACTAM 3.375 G IVPB
3.3750 g | Freq: Three times a day (TID) | INTRAVENOUS | Status: DC
  Administered 2021-06-20 – 2021-06-24 (×12): 3.375 g via INTRAVENOUS
  Filled 2021-06-20 (×10): qty 50

## 2021-06-20 MED ORDER — ACETAMINOPHEN 650 MG RE SUPP: 650.0000 mg | Freq: Four times a day (QID) | RECTAL | Status: DC | PRN

## 2021-06-20 MED ORDER — VANCOMYCIN HCL IN DEXTROSE 1-5 GM/200ML-% IV SOLN
1000.0000 mg | Freq: Once | INTRAVENOUS | Status: AC
  Administered 2021-06-20: 1000 mg via INTRAVENOUS
  Filled 2021-06-20: qty 200

## 2021-06-20 MED ORDER — SODIUM CHLORIDE 0.9 % IV SOLN: INTRAVENOUS | Status: AC

## 2021-06-20 MED ORDER — ACYCLOVIR SODIUM 50 MG/ML IV SOLN
10.0000 mg/kg | Freq: Three times a day (TID) | INTRAVENOUS | Status: DC
  Administered 2021-06-20 – 2021-06-24 (×12): 680 mg via INTRAVENOUS
  Filled 2021-06-20 (×13): qty 13.6

## 2021-06-20 MED ORDER — NALOXONE HCL 0.4 MG/ML IJ SOLN
0.4000 mg | Freq: Once | INTRAMUSCULAR | Status: AC
  Administered 2021-06-20: 0.4 mg via INTRAVENOUS
  Filled 2021-06-20: qty 1

## 2021-06-20 MED ORDER — SODIUM CHLORIDE 0.9 % IV SOLN
2000.0000 mg | Freq: Two times a day (BID) | INTRAVENOUS | Status: DC
  Administered 2021-06-20 – 2021-06-21 (×3): 2000 mg via INTRAVENOUS
  Filled 2021-06-20 (×5): qty 20

## 2021-06-20 MED ORDER — LORAZEPAM 2 MG/ML IJ SOLN
0.5000 mg | Freq: Two times a day (BID) | INTRAMUSCULAR | Status: DC
  Administered 2021-06-20 – 2021-06-21 (×2): 0.5 mg via INTRAVENOUS
  Filled 2021-06-20 (×2): qty 1

## 2021-06-20 MED ORDER — VANCOMYCIN HCL 1250 MG/250ML IV SOLN: 1250.0000 mg | Freq: Two times a day (BID) | INTRAVENOUS | Status: DC

## 2021-06-20 MED ORDER — ACETAMINOPHEN 500 MG PO TABS
1000.0000 mg | ORAL_TABLET | Freq: Once | ORAL | Status: AC
  Administered 2021-06-20: 1000 mg via ORAL
  Filled 2021-06-20: qty 2

## 2021-06-20 NOTE — ED Provider Notes (Signed)
I saw patient in conjunction with Alroy Bailiff, PA-C Patient has history of known seizure disorder on Keppra.  Patient was initially brought in for suspected overdose.  However after arrival patient was noted to have a fever.  He had reported cough and sore throat and suspected that he may have influenza.  Initial work-up was unremarkable including a negative viral panel. I personally reviewed the CT head and discussed it with the radiologist Dr. Mckinley Jewel and she reports there is no acute findings. On reassessment, patient is awake but still reports headache.  Patient is a poor historian. He has tenderness over his right shoulder from recent surgery, but is unable to tell me if this is been ongoing since his surgery.  Mother reports that it appears similar to previous times, he has had limitation in range of motion since that time. This patient has had headache, fever, altered mental status we will proceed with lumbar puncture. Discussed risk and benefits with patient and his mother, and he agrees to proceed   Ripley Fraise, MD 06/20/21 [phone]

## 2021-06-20 NOTE — Progress Notes (Incomplete Revision)
PROGRESS NOTE    Terrence Riley   FBP:[id number]  DOB: [birth year]  DOA: 06/20/2021 PCP: Eulas Post, MD   Brief Narrative:  Terrence Riley is a 20 year old male with history of seizure disorder who [facility] for [facility].  Terrence history was obtained from Terrence Riley's mother who stated that when she went to check on him in his bedroom, she saw that there was urine all over Terrence floor of Terrence spectrum.  She found him downstairs confused and without any clothes on.  He mentioned to her that he believed he may be taking too many medications but did not elaborate.  She did not see him having seizures yesterday however he did have swelling of his right lower lip which appeared to be from an injury.  In Ter[facility] ED Terrence Riley was noted to have a temperature of 101.  He admitted to having a cough and a sore throat.  Due to confusion, an LP was performed.  Blood culture chest x-ray and UA also performed. CT scan of Terrence chest with contrast was performed and this revealed a left upper lobe infiltrate. Influenza and COVID test found to be negative  Subjective: Terrence Riley was noted to be lethargic on my exam this morning.    Assessment & Plan:   Principal Problem:   Acute encephalopathy-fever, leukocytosis - ?  Post ictal versus due to infection versus accidental overdose of medications - Urine drug screen was negative - Serum alcohol level and salicylate level were negligible - Head CT was unrevealing -WBC count was 14.9 - Lactic acid was 2.3 and improved to 1.3 - CSF was not consistent with a bacterial infection-31 RBCs were noted, protein level was normal and there was 1 WBC-have discontinued ceftriaxone-neurology recommends we continue acyclovir until HSV PCR returns  Active Problems:   Seizure disorder ([place]) -Difficult to tell if Terrence Riley was postictal as he was not witnessed to have a seizure - Due to his mental status and inability to take oral medications, Terrence  Riley was placed on Vimpat after being given a Vimpat bolus and Lamictal was held - Keppra was transitioned to IV at his home dose of 2 g twice daily -EEG was performed today and it reveals mild diffuse encephalopathy, excessive beta activity seen on Terrence background is most likely due to Terrence effect of benzodiazepine and is a benign EEG pattern-no seizure or epileptiform discharges were seen throughout Terrence recording  Elevated CPK - CPK noted to be 984- ? If secondary to unwitnessed seizure - Recheck tomorrow- cont IVF to prevent renal injury    Aspiration pneumonia ? -Left upper lobe infiltrate noted on Terrence CT scan - according to chart Terrence Riley had a cough and a sore throat - Have changed ceftriaxone to Zosyn to cover for possible aspiration ([place])   Time spent in minutes: 35 DVT prophylaxis: SCDs Start: 06/20/21 0432  Code Status: Full code Family Communication:  Level of Care: Level of care: Progressive Disposition Plan:  Status is: Inpatient  Remains inpatient appropriate because: on IV antibiotics- ongoing confusion  Consultants:  neuro Procedures:  EEG Antimicrobials:  Anti-infectives (From admission, onward)    Start     Dose/Rate Route Frequency Ordered Stop   06/20/21 1700  vancomycin (VANCOREADY) IVPB 1250 mg/250 mL  Status:  Discontinued        1,250 mg 166.7 mL/hr over 90 Minutes Intravenous Every 12 hours 06/20/21 0507 06/20/21 0850   06/20/21 1600  cefTRIAXone (ROCEPHIN) 2  g in sodium chloride 0.9 % 100 mL IVPB  Status:  Discontinued        2 g 200 mL/hr over 30 Minutes Intravenous Every 12 hours 06/20/21 0440 06/20/21 0853   06/20/21 0900  piperacillin-tazobactam (ZOSYN) IVPB 3.375 g        3.375 g 12.5 mL/hr over 240 Minutes Intravenous Every 8 hours 06/20/21 0856     06/20/21 0545  vancomycin (VANCOREADY) IVPB 500 mg/100 mL        500 mg 100 mL/hr over 60 Minutes Intravenous  Once 06/20/21 0444 06/20/21 0558   06/20/21 0500  acyclovir (ZOVIRAX) 680 mg in  dextrose 5 % 100 mL IVPB        10 mg/kg  68 kg 113.6 mL/hr over 60 Minutes Intravenous Every 8 hours 06/20/21 0442     06/20/21 0300  cefTRIAXone (ROCEPHIN) 2 g in sodium chloride 0.9 % 100 mL IVPB        2 g 200 mL/hr over 30 Minutes Intravenous  Once 06/20/21 0245 06/20/21 0439   06/20/21 0300  vancomycin (VANCOCIN) IVPB 1000 mg/200 mL premix        1,000 mg 200 mL/hr over 60 Minutes Intravenous  Once 06/20/21 0245 06/20/21 0528        Objective: Vitals:   06/20/21 1245 06/20/21 1300 06/20/21 1330 06/20/21 1454  BP:  113/72  120/72  Pulse: 77 76 71 78  Resp:    12  Temp:    98.2 F (36.8 C)  TempSrc:    Oral  SpO2: 100% 100% 100% 100%  Weight:      Height:        Intake/Output Summary (Last 24 hours) at 06/20/2021 1556 Last data filed at 06/20/2021 1322 Gross per 24 hour  Intake 1845 ml  Output --  Net 1845 ml   Filed Weights   06/20/21 0007  Weight: 68 kg    Examination: General exam: Appears comfortable - lethargic HEENT: PERRLA,  no sclera icterus or thrush Respiratory system: Clear to auscultation. Respiratory effort normal. Cardiovascular system: S1 & S2 heard, RRR.   Gastrointestinal system: Abdomen soft, non-tender, nondistended. Normal bowel sounds. Central nervous system: cannot assess due to lethargy Extremities: No cyanosis, clubbing or edema Skin: No rashes or ulcers Psychiatry: cannot assess     Data Reviewed: I have personally reviewed following labs and imaging studies  CBC: Recent Labs  Lab 06/20/21 0042 06/20/21 0554  WBC 14.9* 12.7*  NEUTROABS 13.6* 9.8*  HGB 15.4 12.3*  HCT 44.5 35.6*  MCV 83.8 84.0  PLT 210 944   Basic Metabolic Panel: Recent Labs  Lab 06/20/21 0042 06/20/21 0554  NA 138 135  K 4.5 3.6  CL 105 107  CO2 23 21*  GLUCOSE 91 98  BUN 17 16  CREATININE 1.14 0.93  CALCIUM 9.5 8.4*   GFR: Estimated Creatinine Clearance: 121.9 mL/min (by C-G formula based on SCr of 0.93 mg/dL). Liver Function  Tests: Recent Labs  Lab 06/20/21 0042 06/20/21 0554  AST 51* 33  ALT 43 28  ALKPHOS 80 62  BILITOT 1.1 0.7  PROT 7.9 6.4*  ALBUMIN 4.8 3.9   No results for input(s): LIPASE, AMYLASE in Terrence last 168 hours. No results for input(s): AMMONIA in Terrence last 168 hours. Coagulation Profile: No results for input(s): INR, PROTIME in Terrence last 168 hours. Cardiac Enzymes: Recent Labs  Lab 06/20/21 0042  CKTOTAL 984*   BNP (last 3 results) No results for input(s): PROBNP in Terrence last  8760 hours. HbA1C: No results for input(s): HGBA1C in Terrence last 72 hours. CBG: Recent Labs  Lab 06/20/21 0613 06/20/21 1141  GLUCAP 90 83   Lipid Profile: No results for input(s): CHOL, HDL, LDLCALC, TRIG, CHOLHDL, LDLDIRECT in Terrence last 72 hours. Thyroid Function Tests: No results for input(s): TSH, T4TOTAL, FREET4, T3FREE, THYROIDAB in Terrence last 72 hours. Anemia Panel: No results for input(s): VITAMINB12, FOLATE, FERRITIN, TIBC, IRON, RETICCTPCT in Terrence last 72 hours. Urine analysis:    Component Value Date/Time   COLORURINE YELLOW 06/20/2021 0041   APPEARANCEUR HAZY (A) 06/20/2021 0041   APPEARANCEUR Clear 08/23/2013 1127   LABSPEC 1.013 06/20/2021 0041   LABSPEC 1.017 08/23/2013 1127   PHURINE 5.0 06/20/2021 0041   GLUCOSEU NEGATIVE 06/20/2021 0041   GLUCOSEU Negative 08/23/2013 1127   HGBUR MODERATE (A) 06/20/2021 0041   BILIRUBINUR NEGATIVE 06/20/2021 0041   BILIRUBINUR Negative 08/23/2013 1127   KETONESUR 20 (A) 06/20/2021 0041   PROTEINUR 30 (A) 06/20/2021 0041   NITRITE NEGATIVE 06/20/2021 0041   LEUKOCYTESUR NEGATIVE 06/20/2021 0041   LEUKOCYTESUR Negative 08/23/2013 1127   Sepsis Labs: @LABRCNTIP (procalcitonin:4,lacticidven:4) ) Recent Results (from Terrence past 240 hour(s))  Resp Panel by RT-PCR (Flu A&B, Covid) Nasopharyngeal Swab     Status: None   Collection Time: 06/20/21  1:19 AM   Specimen: Nasopharyngeal Swab; Nasopharyngeal(NP) swabs in vial transport medium  Result Value Ref  Range Status   SARS Coronavirus 2 by RT PCR NEGATIVE NEGATIVE Final    Comment: (NOTE) SARS-CoV-2 target nucleic acids are NOT DETECTED.  Terrence SARS-CoV-2 RNA is generally detectable in upper respiratory specimens during Terrence acute phase of infection. Terrence lowest concentration of SARS-CoV-2 viral copies this assay can detect is 138 copies/mL. A negative result does not preclude SARS-Cov-2 infection and should not be used as Terrence sole basis for treatment or other Riley management decisions. A negative result may occur with  improper specimen collection/handling, submission of specimen other than nasopharyngeal swab, presence of viral mutation(s) within Terrence areas targeted by this assay, and inadequate number of viral copies(<138 copies/mL). A negative result must be combined with clinical observations, Riley history, and epidemiological information. Terrence expected result is Negative.  Fact Sheet for Patients:  EntrepreneurPulse.com.au  Fact Sheet for Healthcare Providers:  IncredibleEmployment.be  This test is no t yet approved or cleared by [place] FDA and  has been authorized for detection and/or diagnosis of SARS-CoV-2 by FDA under an Emergency Use Authorization (EUA). This EUA will remain  in effect (meaning this test can be used) for Terrence duration of Terrence COVID-19 declaration under Section 564(b)(1) of Terrence Act, 21 U.S.C.section 360bbb-3(b)(1), unless Terrence authorization is terminated  or revoked sooner.       Influenza A by PCR NEGATIVE NEGATIVE Final   Influenza B by PCR NEGATIVE NEGATIVE Final    Comment: (NOTE) Terrence Xpert Xpress SARS-CoV-2/FLU/RSV plus assay is intended as an aid in Terrence diagnosis of influenza from Nasopharyngeal swab specimens and should not be used as a sole basis for treatment. Nasal washings and aspirates are unacceptable for Xpert Xpress SARS-CoV-2/FLU/RSV testing.  Fact Sheet for  Patients: EntrepreneurPulse.com.au  Fact Sheet for Healthcare Providers: IncredibleEmployment.be  This test is not yet approved or cleared by [place] FDA and has been authorized for detection and/or diagnosis of SARS-CoV-2 by FDA under an Emergency Use Authorization (EUA). This EUA will remain in effect (meaning this test can be used) for Terrence duration of Terrence COVID-19 declaration under Section 564(b)(1) of Terrence  Act, 21 U.S.C. section 360bbb-3(b)(1), unless Terrence authorization is terminated or revoked.  Performed at [facility], [place] [address], [place], [place] [address]   CSF culture     Status: None (Preliminary result)   Collection Time: 06/20/21  3:39 AM   Specimen: Back; Cerebrospinal Fluid  Result Value Ref Range Status   Specimen Description BACK  Final   Special Requests NONE  Final   Gram Stain   Final    NO ORGANISMS SEEN NO WBC SEEN Gram Stain Report Called to,Read Back By and Verified With: RN J PRAY AT [phone] 06/20/21 CRUICKSHANK A Performed at [facility], [place] [address] [place], [place] [address]    Culture PENDING  Incomplete   Report Status PENDING  Incomplete  Gram stain     Status: None   Collection Time: 06/20/21  3:39 AM   Specimen: CSF; Cerebrospinal Fluid  Result Value Ref Range Status   Specimen Description CSF LP  Final   Special Requests NONE  Final   Gram Stain   Final    NO ORGANISMS SEEN NO WBC SEEN Gram Stain Report Called to,Read Back By and Verified With: rn j pray at 0442 06/20/21 cruickshank a Performed at [facility], [place] [address] [place], [place] [address]    Report Status 06/20/2021 FINAL  Final  Culture, blood (routine x 2)     Status: None (Preliminary result)   Collection Time: 06/20/21  4:01 AM   Specimen: Left Antecubital; Blood  Result Value Ref Range Status   Specimen Description   Final    LEFT ANTECUBITAL Performed at  [place] [address] [place], [place] [address]    Special Requests   Final    BOTTLES DRAWN AEROBIC AND ANAEROBIC Blood Culture adequate volume Performed at [place] [address], [place], [place] [address]    Culture   Final    NO GROWTH < 12 HOURS Performed at [place] [address] [place], [place] [address]    Report Status PENDING  Incomplete  Culture, blood (routine x 2)     Status: None (Preliminary result)   Collection Time: 06/20/21  4:01 AM   Specimen: BLOOD LEFT FOREARM  Result Value Ref Range Status   Specimen Description   Final    BLOOD LEFT FOREARM Performed at [place] [address] [place], [place] [address]    Special Requests   Final    BOTTLES DRAWN AEROBIC AND ANAEROBIC Blood Culture results may not be optimal due to an inadequate volume of blood received in culture bottles Performed at [place] [address], [place], [place] [address]    Culture   Final    NO GROWTH < 12 HOURS Performed at [place] [address], [place],  [address]    Report Status PENDING  Incomplete  Respiratory (~20 pathogens) panel by PCR     Status: None   Collection Time: 06/20/21  5:37 AM   Specimen: Nasopharyngeal Swab; Respiratory  Result Value Ref Range Status   Adenovirus NOT DETECTED NOT DETECTED Final   Coronavirus 229E NOT DETECTED NOT DETECTED Final    Comment: (NOTE) Ter[facility] Coronavirus on Terrence Respiratory Panel, DOES NOT test for Terrence novel  Coronavirus (2019 nCoV)    Coronavirus HKU1 NOT DETECTED NOT DETECTED Final   Coronavirus NL63 NOT DETECTED NOT DETECTED Final   Coronavirus OC43 NOT  DETECTED NOT DETECTED Final   Metapneumovirus NOT DETECTED NOT DETECTED Final   Rhinovirus / Enterovirus NOT DETECTED NOT DETECTED Final   Influenza A NOT DETECTED NOT DETECTED Final   Influenza B NOT DETECTED NOT DETECTED Final   Parainfluenza Virus  1 NOT DETECTED NOT DETECTED Final   Parainfluenza Virus 2 NOT DETECTED NOT DETECTED Final   Parainfluenza Virus 3 NOT DETECTED NOT DETECTED Final   Parainfluenza Virus 4 NOT DETECTED NOT DETECTED Final   Respiratory Syncytial Virus NOT DETECTED NOT DETECTED Final   Bordetella pertussis NOT DETECTED NOT DETECTED Final   Bordetella Parapertussis NOT DETECTED NOT DETECTED Final   Chlamydophila pneumoniae NOT DETECTED NOT DETECTED Final   Mycoplasma pneumoniae NOT DETECTED NOT DETECTED Final    Comment: Performed at [facility], [place] [address], [place], [place] [address]         Radiology Studies: CT Head Wo Contrast  Addendum Date: 06/20/2021   ADDENDUM REPORT: 06/20/2021 02:48 ADDENDUM: Basilar cisterns are patent. No mass effect. Images are obliqued on CT axial. These results were called by telephone at Terrence time of interpretation on 06/20/2021 at 2:44 am to provider Dr. Christy Gentles, who verbally acknowledged these results. Electronically Signed   By: Iven Finn M.D.   On: 06/20/2021 02:48   Result Date: 06/20/2021 CLINICAL DATA:  Mental status change. Unknown cause. Overdose of unknown substance suspected. EXAM: CT HEAD WITHOUT CONTRAST TECHNIQUE: Contiguous axial images were obtained from Terrence base of Terrence skull through Terrence vertex without intravenous contrast. COMPARISON:  None. FINDINGS: Brain: No evidence of large-territorial acute infarction. No parenchymal hemorrhage. No mass lesion. No extra-axial collection. No mass effect or midline shift. No hydrocephalus. Basilar cisterns are patent. Vascular: No hyperdense vessel. Skull: No acute fracture or focal lesion. Sinuses/Orbits: Paranasal sinuses and mastoid air cells are clear. Terrence orbits are unremarkable. Other: None. IMPRESSION: Negative for acute traumatic injury. Electronically Signed: By: Iven Finn M.D. On: 06/20/2021 01:21   CT CHEST WO CONTRAST  Result Date: 06/20/2021 CLINICAL DATA:  Persistent cough with fever and  nausea/vomiting. Blood in Terrence urine and leukocytosis. EXAM: CT CHEST, ABDOMEN AND PELVIS WITHOUT CONTRAST TECHNIQUE: Multidetector CT imaging of Terrence chest, abdomen and pelvis was performed following Terrence standard protocol without IV contrast. COMPARISON:  None. FINDINGS: CT CHEST FINDINGS Cardiovascular: No significant vascular findings. Normal heart size. No pericardial effusion. Mediastinum/Nodes: No adenopathy or visible inflammation Lungs/Pleura: Patchy ground-glass opacity in Terrence left upper lobe, primarily lingular. Although peripheral, no confluent wedge-shaped to imply vascular cause. No pulmonary edema or pleural fluid. Musculoskeletal: No acute finding CT ABDOMEN PELVIS FINDINGS Limited by lack of intra-abdominal fat hand low-dose noncontrast technique. Hepatobiliary: No focal liver abnormality.No evidence of biliary obstruction or stone. Pancreas: Unremarkable. Spleen: Unremarkable. Adrenals/Urinary Tract: Negative adrenals. No hydronephrosis or stone. Unremarkable bladder. Stomach/Bowel: Indistinct bowel and mesenteric fat in Terrence right abdomen is likely from streak artifact from adjacent EKG pad. No convincing bowel wall thickening. No pericecal inflammatory changes. Moderate stool volume. Vascular/Lymphatic: No acute vascular abnormality. No mass or adenopathy. Reproductive:Negative Other: No ascites or pneumoperitoneum. Musculoskeletal: No acute finding. Spinal alignment which may be related to Riley positioning. IMPRESSION: 1. Left upper lobe pneumonia. 2. No hydronephrosis or nephrolithiasis. Electronically Signed   By: Jorje Guild M.D.   On: 06/20/2021 07:12   DG Chest Port 1 View  Result Date: 06/20/2021 CLINICAL DATA:  Rule out aspiration.  Altered mental status. EXAM: PORTABLE CHEST 1 VIEW COMPARISON:  06/10/2021 FINDINGS: Terrence heart size and  mediastinal contours are within normal limits. No consolidation, effusion, or pneumothorax. No acute osseous abnormality. IMPRESSION: No acute  cardiopulmonary process. Electronically Signed   By: Brett Fairy M.D.   On: 06/20/2021 00:59   EEG adult  Result Date: 06/20/2021 Lora Havens, MD     06/20/2021 12:41 PM Terrence Riley MRN: [id number] Epilepsy Attending: Lora Havens Referring Physician/Provider: Dr Gean Birchwood Date: 06/20/2021 Duration: 23.09 mins Riley history: 20 year old male who with history of epilepsy who presented with altered mental status.  EEG to evaluate for seizure. Level of alertness: Awake AEDs during EEG study: Keppra, Vimpat Technical aspects: This EEG study was done with scalp electrodes positioned according to Terrence 10-20 International system of electrode placement. Electrical activity was acquired at a sampling rate of 500Hz  and reviewed with a high frequency filter of 70Hz  and a low frequency filter of 1Hz . EEG data were recorded continuously and digitally stored. Description: Terrence posterior dominant rhythm consists of 9-10 Hz activity of moderate voltage (25-35 uV) seen predominantly in posterior head regions, symmetric and reactive to eye opening and eye closing.  EEG showed intermittent generalized 3 to 6 Hz theta-delta slowing.  There is also an excessive amount of 15 to 18 Hz beta activity with irregular morphology distributed symmetrically and diffusely. Hyperventilation and photic stimulation were not performed.   ABNORMALITY - Intermittent slow, generalized - Excessive beta, generalized IMPRESSION: This study is suggestive of mild diffuse encephalopathy, nonspecific to etiology. Terrence excessive beta activity seen in Terrence background is most likely due to Terrence effect of benzodiazepine and is a benign EEG pattern. No seizures or epileptiform discharges were seen throughout Terrence recording. Priyanka [name]   CT RENAL STONE STUDY  Result Date: 06/20/2021 CLINICAL DATA:  Persistent cough with fever and nausea/vomiting. Blood in Terrence urine and leukocytosis. EXAM: CT CHEST, ABDOMEN AND PELVIS  WITHOUT CONTRAST TECHNIQUE: Multidetector CT imaging of Terrence chest, abdomen and pelvis was performed following Terrence standard protocol without IV contrast. COMPARISON:  None. FINDINGS: CT CHEST FINDINGS Cardiovascular: No significant vascular findings. Normal heart size. No pericardial effusion. Mediastinum/Nodes: No adenopathy or visible inflammation Lungs/Pleura: Patchy ground-glass opacity in Terrence left upper lobe, primarily lingular. Although peripheral, no confluent wedge-shaped to imply vascular cause. No pulmonary edema or pleural fluid. Musculoskeletal: No acute finding CT ABDOMEN PELVIS FINDINGS Limited by lack of intra-abdominal fat hand low-dose noncontrast technique. Hepatobiliary: No focal liver abnormality.No evidence of biliary obstruction or stone. Pancreas: Unremarkable. Spleen: Unremarkable. Adrenals/Urinary Tract: Negative adrenals. No hydronephrosis or stone. Unremarkable bladder. Stomach/Bowel: Indistinct bowel and mesenteric fat in Terrence right abdomen is likely from streak artifact from adjacent EKG pad. No convincing bowel wall thickening. No pericecal inflammatory changes. Moderate stool volume. Vascular/Lymphatic: No acute vascular abnormality. No mass or adenopathy. Reproductive:Negative Other: No ascites or pneumoperitoneum. Musculoskeletal: No acute finding. Spinal alignment which may be related to Riley positioning. IMPRESSION: 1. Left upper lobe pneumonia. 2. No hydronephrosis or nephrolithiasis. Electronically Signed   By: Jorje Guild M.D.   On: 06/20/2021 07:12      Scheduled Meds: Continuous Infusions:  sodium chloride 75 mL/hr at 06/20/21 0821   acyclovir (ZOVIRAX) </= 700 mg IVPB Stopped (06/20/21 0726)   lacosamide (VIMPAT) IV     levETIRAcetam Stopped (06/20/21 4235)   piperacillin-tazobactam (ZOSYN)  IV Stopped (06/20/21 1322)     LOS: 0 days      Debbe Odea, MD [facility] Pager: [email or web address] 06/20/2021, 3:56 PM

## 2021-06-20 NOTE — ED Provider Notes (Signed)
.  Lumbar Puncture  Date/Time: 06/20/2021 3:30 AM Performed by: Ripley Fraise, MD Authorized by: Ripley Fraise, MD   Consent:    Consent obtained:  Written   Consent given by:  Parent and patient   Risks, benefits, and alternatives were discussed: yes     Risks discussed:  Bleeding, infection and pain   Alternatives discussed:  No treatment Universal protocol:    Patient identity confirmed:  Verbally with patient and provided demographic data (verbally w/mother) Pre-procedure details:    Procedure purpose:  Diagnostic   Preparation: Patient was prepped and draped in usual sterile fashion   Anesthesia:    Anesthesia method:  Local infiltration   Local anesthetic:  Lidocaine 1% w/o epi Procedure details:    Lumbar space:  L3-L4 interspace   Patient position:  L lateral decubitus   Needle gauge:  18   Ultrasound guidance: no     Number of attempts:  3   Fluid appearance:  Clear   Tubes of fluid:  4   Total volume (ml):  8 Post-procedure details:    Puncture site:  Direct pressure applied and adhesive bandage applied   Procedure completion:  Tolerated well, no immediate complications Comments:     Procedure performed with PA Alroy Bailiff.  Initial two attempts unsuccessful, I then utilized 18g and was able to obtain clear CSF.  No immediate complications.    Ripley Fraise, MD 06/20/21 [phone]

## 2021-06-20 NOTE — Progress Notes (Signed)
Pharmacy Antibiotic Note  Terrence Riley is a 20 y.o. male admitted on 06/20/2021 with altered mental status- found to have pneumonia.  Antibiotics initially started in the ED to rule out meningitis.  LP performed and is not indicative of meningitis.  Pharmacy has now been consulted for zosyn dosing for aspiration pneumonia.  Plan: Zosyn 3.375g IV q8h (4 hour infusion). Vancomycin and ceftriaxone d/c'd  Continue acyclovir 10mg /kg IV q8 hours F/u HSV PCR  Dose adjustments for zosyn unlikely at this time given stable renal function. Will formally sign off of zosyn consult and continue to monitor peripherally.  Height: 6' (182.9 cm) Weight: 68 kg (150 lb) IBW/kg (Calculated) : 77.6  Temp (24hrs), Avg:101.4 F (38.6 C), Min:100.8 F (38.2 C), Max:101.9 F (38.8 C)  Recent Labs  Lab 06/20/21 0042 06/20/21 0401 06/20/21 0554  WBC 14.9*  --  12.7*  CREATININE 1.14  --  0.93  LATICACIDVEN 2.3* 1.3  --     Estimated Creatinine Clearance: 121.9 mL/min (by C-G formula based on SCr of 0.93 mg/dL).    Allergies  Allergen Reactions   Lortab [Hydrocodone-Acetaminophen] Hives   Other Anaphylaxis    Peanuts   Zofran [Ondansetron Hcl] Nausea And Vomiting   Citrus Other (See Comments)    Sores in mouth   Dairy Aid [Tilactase] Nausea And Vomiting   Influenza Virus Vaccine Other (See Comments)    Partial paralysis for a couple of weeks per mom   Soy Allergy Other (See Comments)    Seizures    Eggs Or Egg-Derived Products Rash    Pt mother reports pt only avoids eggs alone but can tolerate as an ingredient. [place] 07/27/13   Hydrocodone-Acetaminophen Rash    Antimicrobials this admission: Acyclovir 11/25 >>  Zosyn 11/25 >>  Vanc 11/25 >> 11/25 Ceftriaxone 11/25 >> 11/25  Dose adjustments this admission:   Microbiology results: 11/25 BCx:  11/25 UCx:   11/25 CSF: no organisms (pending) 11/25 Gram stain LP: no organisms seen (final) 11/25 HSV PCR: pending  Thank you for  allowing pharmacy to be a part of this patient's care.  Dimple Nanas, PharmD 06/20/2021 9:52 AM

## 2021-06-20 NOTE — Progress Notes (Signed)
EEG complete - results pending 

## 2021-06-20 NOTE — Progress Notes (Addendum)
PROGRESS NOTE    Terrence Riley   TFT:[id number]  DOB: 2000-10-24  DOA: 06/20/2021 PCP: Eulas Post, MD   Brief Narrative:  Terrence Riley is a 20 year old male with history of seizure disorder who presents to the hospital for confusion.  The history was obtained from the patient's mother who stated that when she went to check on him in his bedroom, she saw that there was urine all over the floor of the spectrum.  She found him downstairs confused and without any clothes on.  He mentioned to her that he believed he may be taking too many medications but did not elaborate.  She did not see him having seizures yesterday however he did have swelling of his right lower lip which appeared to be from an injury.  In the ED the patient was noted to have a temperature of 101.  He admitted to having a cough and a sore throat.  Due to confusion, an LP was performed.  Blood culture chest x-ray and UA also performed. CT scan of the chest with contrast was performed and this revealed a left upper lobe infiltrate. Influenza and COVID test found to be negative  Subjective: He is alert today but not very communicative. No complaints.     Assessment & Plan:   Principal Problem:   Acute encephalopathy-fever, leukocytosis - ?  Post ictal versus due to infection versus accidental overdose of medications - Urine drug screen was negative - Serum alcohol level and salicylate level were negligible - Head CT was unrevealing -WBC count was 14.9 - Lactic acid was 2.3 and improved to 1.3 - CSF was not consistent with a bacterial infection-31 RBCs were noted, protein level was normal and there was 1 WBC-have discontinued ceftriaxone-neurology recommends we continue acyclovir until HSV PCR returns  Active Problems:   Seizure disorder ([place]) -Difficult to tell if the patient was postictal as he was not witnessed to have a seizure - Due to his mental status and inability to take oral medications,  Lamictal was held and the patient was placed on Vimpat after being given a Vimpat bolus - as he is alert today, will resume Lamictal - Keppra was transitioned to IV at his home dose of 2 g twice daily - 11/25> EEG was performed and it reveals mild diffuse encephalopathy, excessive beta activity seen on the background is most likely due to the effect of benzodiazepine and is a benign EEG pattern-no seizure or epileptiform discharges were seen throughout the recording  Elevated CPK - CPK noted to be 984- ? If secondary to unwitnessed seizure - Recheck tomorrow- cont IVF to prevent renal injury    Aspiration pneumonia ? -Left upper lobe infiltrate noted on the CT scan - according to chart the patient had a cough and a sore throat - cont Zosyn to cover for possible aspiration ([place])   Time spent in minutes: 35 DVT prophylaxis: SCDs Start: 06/20/21 0432  Code Status: Full code Family Communication:  Level of Care: Level of care: Progressive Disposition Plan:  Status is: Inpatient  Remains inpatient appropriate because: on IV antibiotics- ongoing confusion  Consultants:  neuro Procedures:  EEG Antimicrobials:  Anti-infectives (From admission, onward)    Start     Dose/Rate Route Frequency Ordered Stop   06/20/21 1700  vancomycin (VANCOREADY) IVPB 1250 mg/250 mL  Status:  Discontinued        1,250 mg 166.7 mL/hr over 90 Minutes Intravenous Every 12 hours 06/20/21 0507 06/20/21 0850  06/20/21 1600  cefTRIAXone (ROCEPHIN) 2 g in sodium chloride 0.9 % 100 mL IVPB  Status:  Discontinued        2 g 200 mL/hr over 30 Minutes Intravenous Every 12 hours 06/20/21 0440 06/20/21 0853   06/20/21 0900  piperacillin-tazobactam (ZOSYN) IVPB 3.375 g        3.375 g 12.5 mL/hr over 240 Minutes Intravenous Every 8 hours 06/20/21 0856     06/20/21 0545  vancomycin (VANCOREADY) IVPB 500 mg/100 mL        500 mg 100 mL/hr over 60 Minutes Intravenous  Once 06/20/21 0444 06/20/21 0558   06/20/21 0500   acyclovir (ZOVIRAX) 680 mg in dextrose 5 % 100 mL IVPB        10 mg/kg  68 kg 113.6 mL/hr over 60 Minutes Intravenous Every 8 hours 06/20/21 0442     06/20/21 0300  cefTRIAXone (ROCEPHIN) 2 g in sodium chloride 0.9 % 100 mL IVPB        2 g 200 mL/hr over 30 Minutes Intravenous  Once 06/20/21 0245 06/20/21 0439   06/20/21 0300  vancomycin (VANCOCIN) IVPB 1000 mg/200 mL premix        1,000 mg 200 mL/hr over 60 Minutes Intravenous  Once 06/20/21 0245 06/20/21 0528        Objective: Vitals:   06/20/21 1245 06/20/21 1300 06/20/21 1330 06/20/21 1454  BP:  113/72  120/72  Pulse: 77 76 71 78  Resp:    12  Temp:    98.2 F (36.8 C)  TempSrc:    Oral  SpO2: 100% 100% 100% 100%  Weight:      Height:        Intake/Output Summary (Last 24 hours) at 06/20/2021 1556 Last data filed at 06/20/2021 1322 Gross per 24 hour  Intake 1845 ml  Output --  Net 1845 ml   Filed Weights   06/20/21 0007  Weight: 68 kg    Examination: General exam: Appears comfortable  HEENT: PERRLA, oral mucosa moist, no sclera icterus or thrush Respiratory system: Clear to auscultation. Respiratory effort normal. Cardiovascular system: S1 & S2 heard, regular rate and rhythm Gastrointestinal system: Abdomen soft, non-tender, nondistended. Normal bowel sounds   Central nervous system: Alert and oriented. No focal neurological deficits. Extremities: No cyanosis, clubbing or edema Skin: No rashes or ulcers Psychiatry:  Mood & affect appropriate.      Data Reviewed: I have personally reviewed following labs and imaging studies  CBC: Recent Labs  Lab 06/20/21 0042 06/20/21 0554  WBC 14.9* 12.7*  NEUTROABS 13.6* 9.8*  HGB 15.4 12.3*  HCT 44.5 35.6*  MCV 83.8 84.0  PLT 210 578   Basic Metabolic Panel: Recent Labs  Lab 06/20/21 0042 06/20/21 0554  NA 138 135  K 4.5 3.6  CL 105 107  CO2 23 21*  GLUCOSE 91 98  BUN 17 16  CREATININE 1.14 0.93  CALCIUM 9.5 8.4*   GFR: Estimated Creatinine  Clearance: 121.9 mL/min (by C-G formula based on SCr of 0.93 mg/dL). Liver Function Tests: Recent Labs  Lab 06/20/21 0042 06/20/21 0554  AST 51* 33  ALT 43 28  ALKPHOS 80 62  BILITOT 1.1 0.7  PROT 7.9 6.4*  ALBUMIN 4.8 3.9   No results for input(s): LIPASE, AMYLASE in the last 168 hours. No results for input(s): AMMONIA in the last 168 hours. Coagulation Profile: No results for input(s): INR, PROTIME in the last 168 hours. Cardiac Enzymes: Recent Labs  Lab 06/20/21 [phone]  CKTOTAL 984*   BNP (last 3 results) No results for input(s): PROBNP in the last 8760 hours. HbA1C: No results for input(s): HGBA1C in the last 72 hours. CBG: Recent Labs  Lab 06/20/21 0613 06/20/21 1141  GLUCAP 90 83   Lipid Profile: No results for input(s): CHOL, HDL, LDLCALC, TRIG, CHOLHDL, LDLDIRECT in the last 72 hours. Thyroid Function Tests: No results for input(s): TSH, T4TOTAL, FREET4, T3FREE, THYROIDAB in the last 72 hours. Anemia Panel: No results for input(s): VITAMINB12, FOLATE, FERRITIN, TIBC, IRON, RETICCTPCT in the last 72 hours. Urine analysis:    Component Value Date/Time   COLORURINE YELLOW 06/20/2021 0041   APPEARANCEUR HAZY (A) 06/20/2021 0041   APPEARANCEUR Clear 08/23/2013 1127   LABSPEC 1.013 06/20/2021 0041   LABSPEC 1.017 08/23/2013 1127   PHURINE 5.0 06/20/2021 0041   GLUCOSEU NEGATIVE 06/20/2021 0041   GLUCOSEU Negative 08/23/2013 1127   HGBUR MODERATE (A) 06/20/2021 0041   BILIRUBINUR NEGATIVE 06/20/2021 0041   BILIRUBINUR Negative 08/23/2013 1127   KETONESUR 20 (A) 06/20/2021 0041   PROTEINUR 30 (A) 06/20/2021 0041   NITRITE NEGATIVE 06/20/2021 0041   LEUKOCYTESUR NEGATIVE 06/20/2021 0041   LEUKOCYTESUR Negative 08/23/2013 1127   Sepsis Labs: @LABRCNTIP (procalcitonin:4,lacticidven:4) ) Recent Results (from the past 240 hour(s))  Resp Panel by RT-PCR (Flu A&B, Covid) Nasopharyngeal Swab     Status: None   Collection Time: 06/20/21  1:19 AM   Specimen:  Nasopharyngeal Swab; Nasopharyngeal(NP) swabs in vial transport medium  Result Value Ref Range Status   SARS Coronavirus 2 by RT PCR NEGATIVE NEGATIVE Final    Comment: (NOTE) SARS-CoV-2 target nucleic acids are NOT DETECTED.  The SARS-CoV-2 RNA is generally detectable in upper respiratory specimens during the acute phase of infection. The lowest concentration of SARS-CoV-2 viral copies this assay can detect is 138 copies/mL. A negative result does not preclude SARS-Cov-2 infection and should not be used as the sole basis for treatment or other patient management decisions. A negative result may occur with  improper specimen collection/handling, submission of specimen other than nasopharyngeal swab, presence of viral mutation(s) within the areas targeted by this assay, and inadequate number of viral copies(<138 copies/mL). A negative result must be combined with clinical observations, patient history, and epidemiological information. The expected result is Negative.  Fact Sheet for Patients:  EntrepreneurPulse.com.au  Fact Sheet for Healthcare Providers:  IncredibleEmployment.be  This test is no t yet approved or cleared by the [place] FDA and  has been authorized for detection and/or diagnosis of SARS-CoV-2 by FDA under an Emergency Use Authorization (EUA). This EUA will remain  in effect (meaning this test can be used) for the duration of the COVID-19 declaration under Section 564(b)(1) of the Act, 21 U.S.C.section 360bbb-3(b)(1), unless the authorization is terminated  or revoked sooner.       Influenza A by PCR NEGATIVE NEGATIVE Final   Influenza B by PCR NEGATIVE NEGATIVE Final    Comment: (NOTE) The Xpert Xpress SARS-CoV-2/FLU/RSV plus assay is intended as an aid in the diagnosis of influenza from Nasopharyngeal swab specimens and should not be used as a sole basis for treatment. Nasal washings and aspirates are unacceptable for  Xpert Xpress SARS-CoV-2/FLU/RSV testing.  Fact Sheet for Patients: EntrepreneurPulse.com.au  Fact Sheet for Healthcare Providers: IncredibleEmployment.be  This test is not yet approved or cleared by the [place] FDA and has been authorized for detection and/or diagnosis of SARS-CoV-2 by FDA under an Emergency Use Authorization (EUA). This EUA will remain in effect (meaning this  test can be used) for the duration of the COVID-19 declaration under Section 564(b)(1) of the Act, 21 U.S.C. section 360bbb-3(b)(1), unless the authorization is terminated or revoked.  Performed at [facility], [place] [address] [place], [place] [address]   CSF culture     Status: None (Preliminary result)   Collection Time: 06/20/21  3:39 AM   Specimen: Back; Cerebrospinal Fluid  Result Value Ref Range Status   Specimen Description BACK  Final   Special Requests NONE  Final   Gram Stain   Final    NO ORGANISMS SEEN NO WBC SEEN Gram Stain Report Called to,Read Back By and Verified With: RN J PRAY AT [phone] 06/20/21 CRUICKSHANK A Performed at [facility], [place] [address] [place], [place] [address]    Culture PENDING  Incomplete   Report Status PENDING  Incomplete  Gram stain     Status: None   Collection Time: 06/20/21  3:39 AM   Specimen: CSF; Cerebrospinal Fluid  Result Value Ref Range Status   Specimen Description CSF LP  Final   Special Requests NONE  Final   Gram Stain   Final    NO ORGANISMS SEEN NO WBC SEEN Gram Stain Report Called to,Read Back By and Verified With: rn j pray at 0442 06/20/21 cruickshank a Performed at [facility], [place] [address] [place], [place] [address]    Report Status 06/20/2021 FINAL  Final  Culture, blood (routine x 2)     Status: None (Preliminary result)   Collection Time: 06/20/21  4:01 AM   Specimen: Left Antecubital; Blood  Result Value Ref Range Status    Specimen Description   Final    LEFT ANTECUBITAL Performed at [place] [address], [place], [place] [address]    Special Requests   Final    BOTTLES DRAWN AEROBIC AND ANAEROBIC Blood Culture adequate volume Performed at [place] [address], [place], [place] [address]    Culture   Final    NO GROWTH < 12 HOURS Performed at [place] [address], [place], [place] [address]    Report Status PENDING  Incomplete  Culture, blood (routine x 2)     Status: None (Preliminary result)   Collection Time: 06/20/21  4:01 AM   Specimen: BLOOD LEFT FOREARM  Result Value Ref Range Status   Specimen Description   Final    BLOOD LEFT FOREARM Performed at [place] [address], [place], [place] [address]    Special Requests   Final    BOTTLES DRAWN AEROBIC AND ANAEROBIC Blood Culture results may not be optimal due to an inadequate volume of blood received in culture bottles Performed at [place] [address], [place], [place] [address]    Culture   Final    NO GROWTH < 12 HOURS Performed at [facility] [address], [place],  [address]    Report Status PENDING  Incomplete  Respiratory (~20 pathogens) panel by PCR     Status: None   Collection Time: 06/20/21  5:37 AM   Specimen: Nasopharyngeal Swab; Respiratory  Result Value Ref Range Status   Adenovirus NOT DETECTED NOT DETECTED Final   Coronavirus 229E NOT DETECTED NOT DETECTED Final    Comment: (NOTE) The Coronavirus on the Respiratory Panel, DOES NOT test for the novel  Coronavirus (2019 nCoV)    Coronavirus HKU1 NOT DETECTED NOT  DETECTED Final   Coronavirus NL63 NOT DETECTED NOT DETECTED Final   Coronavirus OC43 NOT DETECTED NOT DETECTED Final   Metapneumovirus NOT DETECTED NOT DETECTED Final   Rhinovirus / Enterovirus NOT DETECTED NOT DETECTED Final   Influenza A NOT DETECTED NOT DETECTED Final    Influenza B NOT DETECTED NOT DETECTED Final   Parainfluenza Virus 1 NOT DETECTED NOT DETECTED Final   Parainfluenza Virus 2 NOT DETECTED NOT DETECTED Final   Parainfluenza Virus 3 NOT DETECTED NOT DETECTED Final   Parainfluenza Virus 4 NOT DETECTED NOT DETECTED Final   Respiratory Syncytial Virus NOT DETECTED NOT DETECTED Final   Bordetella pertussis NOT DETECTED NOT DETECTED Final   Bordetella Parapertussis NOT DETECTED NOT DETECTED Final   Chlamydophila pneumoniae NOT DETECTED NOT DETECTED Final   Mycoplasma pneumoniae NOT DETECTED NOT DETECTED Final    Comment: Performed at [facility], [place] [address], [place], [place] [address]         Radiology Studies: CT Head Wo Contrast  Addendum Date: 06/20/2021   ADDENDUM REPORT: 06/20/2021 02:48 ADDENDUM: Basilar cisterns are patent. No mass effect. Images are obliqued on CT axial. These results were called by telephone at the time of interpretation on 06/20/2021 at 2:44 am to provider Dr. Christy Gentles, who verbally acknowledged these results. Electronically Signed   By: Iven Finn M.D.   On: 06/20/2021 02:48   Result Date: 06/20/2021 CLINICAL DATA:  Mental status change. Unknown cause. Overdose of unknown substance suspected. EXAM: CT HEAD WITHOUT CONTRAST TECHNIQUE: Contiguous axial images were obtained from the base of the skull through the vertex without intravenous contrast. COMPARISON:  None. FINDINGS: Brain: No evidence of large-territorial acute infarction. No parenchymal hemorrhage. No mass lesion. No extra-axial collection. No mass effect or midline shift. No hydrocephalus. Basilar cisterns are patent. Vascular: No hyperdense vessel. Skull: No acute fracture or focal lesion. Sinuses/Orbits: Paranasal sinuses and mastoid air cells are clear. The orbits are unremarkable. Other: None. IMPRESSION: Negative for acute traumatic injury. Electronically Signed: By: Iven Finn M.D. On: 06/20/2021 01:21   CT CHEST WO  CONTRAST  Result Date: 06/20/2021 CLINICAL DATA:  Persistent cough with fever and nausea/vomiting. Blood in the urine and leukocytosis. EXAM: CT CHEST, ABDOMEN AND PELVIS WITHOUT CONTRAST TECHNIQUE: Multidetector CT imaging of the chest, abdomen and pelvis was performed following the standard protocol without IV contrast. COMPARISON:  None. FINDINGS: CT CHEST FINDINGS Cardiovascular: No significant vascular findings. Normal heart size. No pericardial effusion. Mediastinum/Nodes: No adenopathy or visible inflammation Lungs/Pleura: Patchy ground-glass opacity in the left upper lobe, primarily lingular. Although peripheral, no confluent wedge-shaped to imply vascular cause. No pulmonary edema or pleural fluid. Musculoskeletal: No acute finding CT ABDOMEN PELVIS FINDINGS Limited by lack of intra-abdominal fat hand low-dose noncontrast technique. Hepatobiliary: No focal liver abnormality.No evidence of biliary obstruction or stone. Pancreas: Unremarkable. Spleen: Unremarkable. Adrenals/Urinary Tract: Negative adrenals. No hydronephrosis or stone. Unremarkable bladder. Stomach/Bowel: Indistinct bowel and mesenteric fat in the right abdomen is likely from streak artifact from adjacent EKG pad. No convincing bowel wall thickening. No pericecal inflammatory changes. Moderate stool volume. Vascular/Lymphatic: No acute vascular abnormality. No mass or adenopathy. Reproductive:Negative Other: No ascites or pneumoperitoneum. Musculoskeletal: No acute finding. Spinal alignment which may be related to patient positioning. IMPRESSION: 1. Left upper lobe pneumonia. 2. No hydronephrosis or nephrolithiasis. Electronically Signed   By: Jorje Guild M.D.   On: 06/20/2021 07:12   DG Chest Port 1 View  Result Date: 06/20/2021 CLINICAL DATA:  Rule out aspiration.  Altered mental status. EXAM: PORTABLE CHEST 1 VIEW COMPARISON:  06/10/2021 FINDINGS: The heart size and mediastinal contours are within normal limits. No  consolidation, effusion, or pneumothorax. No acute osseous abnormality. IMPRESSION: No acute cardiopulmonary process. Electronically Signed   By: Brett Fairy M.D.   On: 06/20/2021 00:59   EEG adult  Result Date: 06/20/2021 Lora Havens, MD     06/20/2021 12:41 PM Patient Name: Terrence Riley MRN: [id number] Epilepsy Attending: Lora Havens Referring Physician/Provider: Dr Gean Birchwood Date: 06/20/2021 Duration: 23.09 mins Patient history: 20 year old male who with history of epilepsy who presented with altered mental status.  EEG to evaluate for seizure. Level of alertness: Awake AEDs during EEG study: Keppra, Vimpat Technical aspects: This EEG study was done with scalp electrodes positioned according to the 10-20 International system of electrode placement. Electrical activity was acquired at a sampling rate of 500Hz  and reviewed with a high frequency filter of 70Hz  and a low frequency filter of 1Hz . EEG data were recorded continuously and digitally stored. Description: The posterior dominant rhythm consists of 9-10 Hz activity of moderate voltage (25-35 uV) seen predominantly in posterior head regions, symmetric and reactive to eye opening and eye closing.  EEG showed intermittent generalized 3 to 6 Hz theta-delta slowing.  There is also an excessive amount of 15 to 18 Hz beta activity with irregular morphology distributed symmetrically and diffusely. Hyperventilation and photic stimulation were not performed.   ABNORMALITY - Intermittent slow, generalized - Excessive beta, generalized IMPRESSION: This study is suggestive of mild diffuse encephalopathy, nonspecific to etiology. The excessive beta activity seen in the background is most likely due to the effect of benzodiazepine and is a benign EEG pattern. No seizures or epileptiform discharges were seen throughout the recording. Priyanka [name]   CT RENAL STONE STUDY  Result Date: 06/20/2021 CLINICAL DATA:  Persistent cough with fever  and nausea/vomiting. Blood in the urine and leukocytosis. EXAM: CT CHEST, ABDOMEN AND PELVIS WITHOUT CONTRAST TECHNIQUE: Multidetector CT imaging of the chest, abdomen and pelvis was performed following the standard protocol without IV contrast. COMPARISON:  None. FINDINGS: CT CHEST FINDINGS Cardiovascular: No significant vascular findings. Normal heart size. No pericardial effusion. Mediastinum/Nodes: No adenopathy or visible inflammation Lungs/Pleura: Patchy ground-glass opacity in the left upper lobe, primarily lingular. Although peripheral, no confluent wedge-shaped to imply vascular cause. No pulmonary edema or pleural fluid. Musculoskeletal: No acute finding CT ABDOMEN PELVIS FINDINGS Limited by lack of intra-abdominal fat hand low-dose noncontrast technique. Hepatobiliary: No focal liver abnormality.No evidence of biliary obstruction or stone. Pancreas: Unremarkable. Spleen: Unremarkable. Adrenals/Urinary Tract: Negative adrenals. No hydronephrosis or stone. Unremarkable bladder. Stomach/Bowel: Indistinct bowel and mesenteric fat in the right abdomen is likely from streak artifact from adjacent EKG pad. No convincing bowel wall thickening. No pericecal inflammatory changes. Moderate stool volume. Vascular/Lymphatic: No acute vascular abnormality. No mass or adenopathy. Reproductive:Negative Other: No ascites or pneumoperitoneum. Musculoskeletal: No acute finding. Spinal alignment which may be related to patient positioning. IMPRESSION: 1. Left upper lobe pneumonia. 2. No hydronephrosis or nephrolithiasis. Electronically Signed   By: Jorje Guild M.D.   On: 06/20/2021 07:12      Scheduled Meds: Continuous Infusions:  sodium chloride 75 mL/hr at 06/20/21 0821   acyclovir (ZOVIRAX) </= 700 mg IVPB Stopped (06/20/21 0726)   lacosamide (VIMPAT) IV     levETIRAcetam Stopped (06/20/21 4166)   piperacillin-tazobactam (ZOSYN)  IV Stopped (06/20/21 1322)     LOS: 0 days      Debbe Odea, MD  [facility] Pager: [email or web address] 06/20/2021, 3:56 PM

## 2021-06-20 NOTE — H&P (Addendum)
History and Physical    KARI KERTH DSK:[id number] DOB: 11/15/2000 DOA: 06/20/2021  PCP: Eulas Post, MD  Patient coming from: Home.  History obtained from patient's mother.  Patient is encephalopathic.  Chief Complaint: Acute change in mental status.  HPI: Terrence Riley is a 20 y.o. male with history of seizures who had a right shoulder surgery last month had follow-up with surgeon last week was found to be acutely confused last night around 10 PM.  Prior to this patient mother saw the patient napping around 6 PM.  When patient mother saw the patient initially was confused and naked standing in the kitchen and also had urinated on the floor.  His lips look swollen.  Per patient's mother patient stated that he may have taken some of his medicine too much but he was not pointing to which one.  He appeared confused and he was brought to the ER.  Prior to coming to the ER patient did throw up twice as per the patient's mother.  Per patient's mother patient has not had any recent travel or sick contacts.  ED Course: In the ER patient appears confused irritable not answering questions well.  In the ER patient did complain of headache and some sore throat.  But on exam patient's throat does not look erythematous or any signs of pharyngitis or tonsillitis and no definite neck rigidity.  CT head was unremarkable.  Since there was some concern that he may have taken oxycodone since he was prescribed that Narcan was given but did not change mental status.  Urine drug screen was negative and blood work showed leukocytosis patient also was febrile with temperature 101 F.  COVID test flu test chest x-ray and UA were all largely unremarkable except for UA showing some WBC.  Given the symptoms with acute change in mental status with fever patient underwent lumbar puncture results of which are pending.  Also had urine cultures blood cultures done.  Patient empirically started on antibiotics for  possible meningitis admitted for further work-up.  On my exam patient is responding to calling his name.  Pupils are reacting to light.  No definite rash seen on the extremities.  Review of Systems: As per HPI, rest all negative.   Past Medical History:  Diagnosis Date   Allergy    rhinitis   Asthma    as a child   Complication of anesthesia    pt. reports he need more anesthesia with the septoplasty surgery   Eosinophilic esophagitis    heartburn and reflux   Family history of adverse reaction to anesthesia    mother respiratory failure   Headache    Nasal fracture    deviated septum   Pneumonia    1x history of   Premature baby    2 months early   Seizures ([place])    well controlled on meds/ one 2 months ago when meds were late    Past Surgical History:  Procedure Laterality Date   ADENOIDECTOMY     CLOSED REDUCTION NASAL FRACTURE N/A 08/31/2017   Procedure: CLOSED REDUCTION NASAL FRACTURE;  Surgeon: Clyde Canterbury, MD;  Location: [facility];  Service: ENT;  Laterality: N/A;   ORIF HUMERUS FRACTURE Right 05/05/2021   Procedure: reverse Hill-Sachs allografting, biceps tenodesis;  Surgeon: [name], MD;  Location: [facility];  Service: Orthopedics;  Laterality: Right;   SEPTOPLASTY N/A 08/31/2017   Procedure: SEPTOPLASTY;  Surgeon: Clyde Canterbury, MD;  Location: [facility];  Service: ENT;  Laterality: N/A;   SHOULDER ARTHROSCOPY WITH LABRAL REPAIR Left 04/30/2020   Procedure: LEFT SHOULDER POSTERIOR LABRAL REPAIR WITH ARTHROSCOPY, BICEPS TENDON RELEASE AND TENODESIS, OPEN ALLOGRAFT FOR REVERSE BANKART LESION;  Surgeon: [name], MD;  Location: [facility];  Service: Orthopedics;  Laterality: Left;   SHOULDER ARTHROSCOPY WITH LABRAL REPAIR Right 05/05/2021   Procedure: right shoulder arthroscopy, biceps release, posterior labral repair;;  Surgeon: [name], MD;  Location: [facility];  Service: Orthopedics;  Laterality: Right;   TONSILLECTOMY     and  addenoids     reports that he has never smoked. He has never used smokeless tobacco. He reports that he does not drink alcohol and does not use drugs.  Allergies  Allergen Reactions   Lortab [Hydrocodone-Acetaminophen] Hives   Other Anaphylaxis    Peanuts   Zofran [Ondansetron Hcl] Nausea And Vomiting   Citrus Other (See Comments)    Sores in mouth   Dairy Aid [Tilactase] Nausea And Vomiting   Influenza Virus Vaccine Other (See Comments)    Partial paralysis for a couple of weeks per mom   Soy Allergy Other (See Comments)    Seizures    Eggs Or Egg-Derived Products Rash    Pt mother reports pt only avoids eggs alone but can tolerate as an ingredient. [facility] 07/27/13   Hydrocodone-Acetaminophen Rash    Family History  Problem Relation Age of Onset   Cancer Mother        breat   Protein S deficiency Mother     Prior to Admission medications   Medication Sig Start Date End Date Taking? Authorizing Provider  cetirizine (ZYRTEC) 5 MG tablet Take 5 mg by mouth at bedtime.   Yes [provider]  clonazePAM (KLONOPIN) 1 MG tablet Take 1 tablet (1 mg total) by mouth 2 (two) times daily. 05/05/21  Yes Cameron Sprang, MD  KEPPRA 1000 MG tablet Take 2 tablets every night Patient taking differently: Take 2,000 mg by mouth 2 (two) times daily. 04/18/21  Yes Cameron Sprang, MD  lidocaine (LIDODERM) 5 % Place 1 patch onto the skin daily. Remove & Discard patch within 12 hours or as directed by MD Patient taking differently: Place 1 patch onto the skin daily as needed (pain). Remove & Discard patch within 12 hours or as directed by MD 02/25/20  Yes Henderly, Britni A, PA-C  oxyCODONE (ROXICODONE) 5 MG immediate release tablet Take 1 tablet (5 mg total) by mouth every 4 (four) hours as needed. Patient taking differently: Take 5 mg by mouth every 4 (four) hours as needed for moderate pain. 05/05/21 05/05/22 Yes Magnant, Charles L, PA-C  topiramate (TOPAMAX) 200 MG tablet Take 1 tablet every  night Patient taking differently: Take 200 mg by mouth 2 (two) times daily. 04/16/21  Yes Cameron Sprang, MD  EPINEPHrine 0.3 mg/0.3 mL IJ SOAJ injection Inject 0.3 mg into the muscle as needed for anaphylaxis. Patient not taking: Reported on 06/20/2021 07/10/20   McDonald, Maree Erie A, PA-C  methocarbamol (ROBAXIN) 500 MG tablet Take 1 tablet (500 mg total) by mouth every 8 (eight) hours as needed. Patient taking differently: Take 500 mg by mouth every 8 (eight) hours as needed for muscle spasms. 05/05/21   Magnant, Gerrianne Scale, PA-C  promethazine (PHENERGAN) 25 MG tablet Take 1 tablet (25 mg total) by mouth every 8 (eight) hours as needed for nausea or vomiting. 06/10/21   Antonietta Breach, PA-C    Physical Exam:  Constitutional: Moderately built and nourished. Vitals:   06/20/21 0225 06/20/21 0233 06/20/21 0300 06/20/21 0351  BP:  125/77 (!) 95/51 108/60  Pulse:  91 89 93  Resp:  12 16 16   Temp: (!) 100.8 F (38.2 C)     TempSrc: Oral     SpO2:  98% 99% 98%  Weight:      Height:       Eyes: Anicteric no pallor. ENMT: Lower lips are swollen. Neck: No definite neck rigidity. Respiratory: No rhonchi or crepitations. Cardiovascular: S1-S2 heard. Abdomen: Soft nontender bowel sound present. Musculoskeletal: No edema.  Right shoulder appears mildly swollen but no erythema or restriction of movement when I try to move patient does not appear to be in pain. Skin: No rash. Neurologic: Patient lethargic irritable but responds to on calling his name.  Moving all extremities. Psychiatric: Appears confused.   Labs on Admission: I have personally reviewed following labs and imaging studies  CBC: Recent Labs  Lab 06/20/21 0042  WBC 14.9*  NEUTROABS 13.6*  HGB 15.4  HCT 44.5  MCV 83.8  PLT 865   Basic Metabolic Panel: Recent Labs  Lab 06/20/21 0042  NA 138  K 4.5  CL 105  CO2 23  GLUCOSE 91  BUN 17  CREATININE 1.14  CALCIUM 9.5   GFR: Estimated Creatinine Clearance: 99.4 mL/min  (by C-G formula based on SCr of 1.14 mg/dL). Liver Function Tests: Recent Labs  Lab 06/20/21 0042  AST 51*  ALT 43  ALKPHOS 80  BILITOT 1.1  PROT 7.9  ALBUMIN 4.8   No results for input(s): LIPASE, AMYLASE in the last 168 hours. No results for input(s): AMMONIA in the last 168 hours. Coagulation Profile: No results for input(s): INR, PROTIME in the last 168 hours. Cardiac Enzymes: Recent Labs  Lab 06/20/21 0042  CKTOTAL 984*   BNP (last 3 results) No results for input(s): PROBNP in the last 8760 hours. HbA1C: No results for input(s): HGBA1C in the last 72 hours. CBG: No results for input(s): GLUCAP in the last 168 hours. Lipid Profile: No results for input(s): CHOL, HDL, LDLCALC, TRIG, CHOLHDL, LDLDIRECT in the last 72 hours. Thyroid Function Tests: No results for input(s): TSH, T4TOTAL, FREET4, T3FREE, THYROIDAB in the last 72 hours. Anemia Panel: No results for input(s): VITAMINB12, FOLATE, FERRITIN, TIBC, IRON, RETICCTPCT in the last 72 hours. Urine analysis:    Component Value Date/Time   COLORURINE YELLOW 06/20/2021 0041   APPEARANCEUR HAZY (A) 06/20/2021 0041   APPEARANCEUR Clear 08/23/2013 1127   LABSPEC 1.013 06/20/2021 0041   LABSPEC 1.017 08/23/2013 1127   PHURINE 5.0 06/20/2021 0041   GLUCOSEU NEGATIVE 06/20/2021 0041   GLUCOSEU Negative 08/23/2013 1127   HGBUR MODERATE (A) 06/20/2021 0041   BILIRUBINUR NEGATIVE 06/20/2021 0041   BILIRUBINUR Negative 08/23/2013 1127   KETONESUR 20 (A) 06/20/2021 0041   PROTEINUR 30 (A) 06/20/2021 0041   NITRITE NEGATIVE 06/20/2021 0041   LEUKOCYTESUR NEGATIVE 06/20/2021 0041   LEUKOCYTESUR Negative 08/23/2013 1127   Sepsis Labs: @LABRCNTIP (procalcitonin:4,lacticidven:4) ) Recent Results (from the past 240 hour(s))  Resp Panel by RT-PCR (Flu A&B, Covid) Nasopharyngeal Swab     Status: None   Collection Time: 06/20/21  1:19 AM   Specimen: Nasopharyngeal Swab; Nasopharyngeal(NP) swabs in vial transport medium   Result Value Ref Range Status   SARS Coronavirus 2 by RT PCR NEGATIVE NEGATIVE Final    Comment: (NOTE) SARS-CoV-2 target nucleic acids are NOT DETECTED.  The SARS-CoV-2 RNA is generally detectable in upper  respiratory specimens during the acute phase of infection. The lowest concentration of SARS-CoV-2 viral copies this assay can detect is 138 copies/mL. A negative result does not preclude SARS-Cov-2 infection and should not be used as the sole basis for treatment or other patient management decisions. A negative result may occur with  improper specimen collection/handling, submission of specimen other than nasopharyngeal swab, presence of viral mutation(s) within the areas targeted by this assay, and inadequate number of viral copies(<138 copies/mL). A negative result must be combined with clinical observations, patient history, and epidemiological information. The expected result is Negative.  Fact Sheet for Patients:  EntrepreneurPulse.com.au  Fact Sheet for Healthcare Providers:  IncredibleEmployment.be  This test is no t yet approved or cleared by the [place] FDA and  has been authorized for detection and/or diagnosis of SARS-CoV-2 by FDA under an Emergency Use Authorization (EUA). This EUA will remain  in effect (meaning this test can be used) for the duration of the COVID-19 declaration under Section 564(b)(1) of the Act, 21 U.S.C.section 360bbb-3(b)(1), unless the authorization is terminated  or revoked sooner.       Influenza A by PCR NEGATIVE NEGATIVE Final   Influenza B by PCR NEGATIVE NEGATIVE Final    Comment: (NOTE) The Xpert Xpress SARS-CoV-2/FLU/RSV plus assay is intended as an aid in the diagnosis of influenza from Nasopharyngeal swab specimens and should not be used as a sole basis for treatment. Nasal washings and aspirates are unacceptable for Xpert Xpress SARS-CoV-2/FLU/RSV testing.  Fact Sheet for  Patients: EntrepreneurPulse.com.au  Fact Sheet for Healthcare Providers: IncredibleEmployment.be  This test is not yet approved or cleared by the [place] FDA and has been authorized for detection and/or diagnosis of SARS-CoV-2 by FDA under an Emergency Use Authorization (EUA). This EUA will remain in effect (meaning this test can be used) for the duration of the COVID-19 declaration under Section 564(b)(1) of the Act, 21 U.S.C. section 360bbb-3(b)(1), unless the authorization is terminated or revoked.  Performed at [facility] [place] [address] [place], [place] [address]      Radiological Exams on Admission: CT Head Wo Contrast  Addendum Date: 06/20/2021   ADDENDUM REPORT: 06/20/2021 02:48 ADDENDUM: Basilar cisterns are patent. No mass effect. Images are obliqued on CT axial. These results were called by telephone at the time of interpretation on 06/20/2021 at 2:44 am to provider Dr. Christy Gentles, who verbally acknowledged these results. Electronically Signed   By: Iven Finn M.D.   On: 06/20/2021 02:48   Result Date: 06/20/2021 CLINICAL DATA:  Mental status change. Unknown cause. Overdose of unknown substance suspected. EXAM: CT HEAD WITHOUT CONTRAST TECHNIQUE: Contiguous axial images were obtained from the base of the skull through the vertex without intravenous contrast. COMPARISON:  None. FINDINGS: Brain: No evidence of large-territorial acute infarction. No parenchymal hemorrhage. No mass lesion. No extra-axial collection. No mass effect or midline shift. No hydrocephalus. Basilar cisterns are patent. Vascular: No hyperdense vessel. Skull: No acute fracture or focal lesion. Sinuses/Orbits: Paranasal sinuses and mastoid air cells are clear. The orbits are unremarkable. Other: None. IMPRESSION: Negative for acute traumatic injury. Electronically Signed: By: Iven Finn M.D. On: 06/20/2021 01:21   DG Chest Port 1  View  Result Date: 06/20/2021 CLINICAL DATA:  Rule out aspiration.  Altered mental status. EXAM: PORTABLE CHEST 1 VIEW COMPARISON:  06/10/2021 FINDINGS: The heart size and mediastinal contours are within normal limits. No consolidation, effusion, or pneumothorax. No acute osseous abnormality. IMPRESSION: No acute cardiopulmonary process. Electronically Signed  By: Brett Fairy M.D.   On: 06/20/2021 00:59      Assessment/Plan Principal Problem:   Acute encephalopathy Active Problems:   Seizure disorder (HCC)    Acute encephalopathy with fever -cause not clear.  Initially it was thought that patient may have overdosed Narcan was given with no change in mental status.  Urine drug screen is negative.  While in the ER patient did spike a fever 101 F with labs showing leukocytosis and chest x-ray unremarkable UA not showing definite signs of infection blood cultures urine cultures were obtained COVID test flu test were negative and a lumbar puncture was done.  Labs of which are pending.  Patient is on empiric antibiotics vancomycin and ceftriaxone and acyclovir to cover for possible meningitis/encephalitis.  We will check MRI brain.  Right shoulder does not show any definite signs of swelling or infection.  However if no definite source is clear will consult orthopedic shin to have a look.  Respiratory viral panel is pending.  We will repeat labs including salicylate Tylenol levels LFTs again. History of seizures -patient does have a swollen lips but patient and mother feel that he may hit his face somewhere not sure if he had another seizure.  Patient is responding to calling his name so I do not think he is having active seizures but I will get EEG.  I did discuss with on-call neurologist Dr. Alferd Patee since patient is encephalopathic and cannot reliably take orals so we will be converting patient's Keppra and IV and since Topamax does not have IV from neurologist recommended getting Vimpat loading dose  of 200 mg followed by an milligrams every 12 until patient can take his Topamax.  Once patient can take his Topamax we need to bridge the Vimpat for 2 more days before discontinuing the Vimpat and continue the Topamax.  Clonazepam can be held for now. Mildly elevated AST.  Repeat LFTs.  Repeat Tylenol levels.  Check acute hepatitis panel.  Since patient did have nausea vomiting will check CT abdomen. Elevated CK levels will hydrate and recheck CK levels.  Since patient is encephalopathic with fever and will need further work-up bili close monitoring and inpatient status.   DVT prophylaxis: SCDs for now since patient just had a lumbar puncture.  May start Lovenox after 24 hours. Code Status: Full code. Family Communication: Patient and mother. Disposition Plan: Home. Consults called: Discussed with neurologist. Admission status: Inpatient.   Rise Patience MD [facility] Pager [phone].  If 7PM-7AM, please contact night-coverage [email or web address] Password [facility]  06/20/2021, 4:35 AM

## 2021-06-20 NOTE — ED Notes (Signed)
Patient transported to CT 

## 2021-06-20 NOTE — ED Triage Notes (Signed)
Pt to ED via [facility] from home after mother called for suspected overdose but unknown from what.  Hx of seizures.  Pt not talking about incident.

## 2021-06-20 NOTE — Procedures (Signed)
Patient Name: Terrence Riley  MRN: [id number]  Epilepsy Attending: Lora Havens  Referring Physician/Provider: Dr Gean Birchwood Date: 06/20/2021 Duration: 23.09 mins  Patient history: 20 year old male who with history of epilepsy who presented with altered mental status.  EEG to evaluate for seizure.  Level of alertness: Awake  AEDs during EEG study: Keppra, Vimpat  Technical aspects: This EEG study was done with scalp electrodes positioned according to the 10-20 International system of electrode placement. Electrical activity was acquired at a sampling rate of 500Hz  and reviewed with a high frequency filter of 70Hz  and a low frequency filter of 1Hz . EEG data were recorded continuously and digitally stored.   Description: The posterior dominant rhythm consists of 9-10 Hz activity of moderate voltage (25-35 uV) seen predominantly in posterior head regions, symmetric and reactive to eye opening and eye closing.  EEG showed intermittent generalized 3 to 6 Hz theta-delta slowing.  There is also an excessive amount of 15 to 18 Hz beta activity with irregular morphology distributed symmetrically and diffusely. Hyperventilation and photic stimulation were not performed.     ABNORMALITY - Intermittent slow, generalized - Excessive beta, generalized  IMPRESSION: This study is suggestive of mild diffuse encephalopathy, nonspecific to etiology. The excessive beta activity seen in the background is most likely due to the effect of benzodiazepine and is a benign EEG pattern. No seizures or epileptiform discharges were seen throughout the recording.  Sueanne Maniaci [name]

## 2021-06-20 NOTE — ED Provider Notes (Signed)
[facility] DEPT Provider Note   CSN: [id number] Arrival date & time: 06/19/21  2358     History Chief Complaint  Patient presents with   Altered Mental Status    Terrence Riley is a 20 y.o. male with PMHx seizure disorder who presents to the ED today via EMS for complaint of possible drug overdose/AMS.  Per EMS patient's mother called out due to suspected overdose however unknown from what.  On arrival to the ED patient is noted to have a GCS of 13.  He is not talking about the incident and not giving any additional information.  Additional information obtained by mom -she reports that the last time she saw him earlier today was around 3 PM.  She went to check on him prior to going to sleep and saw that there was urine all over the floor in his bedroom.  She found him downstairs without clothes on and states that he seemed very confused and out of it.  She asked him what was going on and he was able to relay to her that he believes he took too much medication however he did not relay what type of medication he took.  She asked him if he wanted to go to the ED and he said yes so she called 911.  She does not believe that he could have had a seizure today.  She states that typically he would not be able to talk to her or be up and walking around.  Patient is noted to have swelling to his right lower lip which is suspected to be from injury.  Mom believes it was from possibly falling.  Patient is noted to be febrile in the ED at one 1.9 which mom was unaware of.  She states earlier today he seemed fine and denies any URI-like symptoms.  She believes he is compliant on his seizure medications.   The history is provided by the patient, a parent, the EMS personnel and medical records. The history is limited by the condition of the patient.      Past Medical History:  Diagnosis Date   Allergy    rhinitis   Asthma    as a child   Complication of anesthesia    pt.  reports he need more anesthesia with the septoplasty surgery   Eosinophilic esophagitis    heartburn and reflux   Family history of adverse reaction to anesthesia    mother respiratory failure   Headache    Nasal fracture    deviated septum   Pneumonia    1x history of   Premature baby    2 months early   Seizures ([place])    well controlled on meds/ one 2 months ago when meds were late    Patient Active Problem List   Diagnosis Date Noted   Acute encephalopathy 06/20/2021   Shoulder instability, right    Humeral head fracture, right, with malunion, subsequent encounter    Esophagitis determined by endoscopy 07/24/2020   Labral tear of shoulder, left, subsequent encounter 02/13/2020   Shoulder dislocation, left, subsequent encounter 02/13/2020   Seizure disorder ([place]) 12/22/2019   Migraines 12/22/2019   Mild intermittent asthma 12/22/2019   Migraine without aura and without status migrainosus, not intractable 06/29/2019   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   Altered mental status 03/12/2014   Lethargic 03/09/2014   Environmental allergies 03/01/2014   Gastroesophageal reflux disease with esophagitis 10/06/2013   Localization-related (focal) (partial) symptomatic epilepsy and  epileptic syndromes with complex partial seizures, not intractable, without status epilepticus ([place]) 10/06/2013   Spells 07/26/2013   Allergy to other foods 07/13/2013   Prophylactic immunotherapy 07/13/2013   Unspecified asthma, uncomplicated 37/34/2876   Eosinophilic esophagitis 81/15/7262   Gastroesophageal reflux 01/23/2013   Vomiting 01/23/2013   Leukopenia 10/19/2012   Muscle jerks during sleep 10/19/2012   Constipation 08/20/2012   Neutropenia ([place]) 08/20/2012   Allergic rhinitis due to pollen 02/19/2011    Past Surgical History:  Procedure Laterality Date   ADENOIDECTOMY     CLOSED REDUCTION NASAL FRACTURE N/A 08/31/2017   Procedure: CLOSED REDUCTION NASAL FRACTURE;  Surgeon: Clyde Canterbury, MD;   Location: [facility];  Service: ENT;  Laterality: N/A;   ORIF HUMERUS FRACTURE Right 05/05/2021   Procedure: reverse Hill-Sachs allografting, biceps tenodesis;  Surgeon: [name], MD;  Location: [facility];  Service: Orthopedics;  Laterality: Right;   SEPTOPLASTY N/A 08/31/2017   Procedure: SEPTOPLASTY;  Surgeon: Clyde Canterbury, MD;  Location: [facility];  Service: ENT;  Laterality: N/A;   SHOULDER ARTHROSCOPY WITH LABRAL REPAIR Left 04/30/2020   Procedure: LEFT SHOULDER POSTERIOR LABRAL REPAIR WITH ARTHROSCOPY, BICEPS TENDON RELEASE AND TENODESIS, OPEN ALLOGRAFT FOR REVERSE BANKART LESION;  Surgeon: [name], MD;  Location: [facility];  Service: Orthopedics;  Laterality: Left;   SHOULDER ARTHROSCOPY WITH LABRAL REPAIR Right 05/05/2021   Procedure: right shoulder arthroscopy, biceps release, posterior labral repair;;  Surgeon: [name], MD;  Location: [facility];  Service: Orthopedics;  Laterality: Right;   TONSILLECTOMY     and addenoids       Family History  Problem Relation Age of Onset   Cancer Mother        breat   Protein S deficiency Mother     Social History   Tobacco Use   Smoking status: Never   Smokeless tobacco: Never  Vaping Use   Vaping Use: Never used  Substance Use Topics   Alcohol use: No   Drug use: No    Home Medications Prior to Admission medications   Medication Sig Start Date End Date Taking? Authorizing Provider  cetirizine (ZYRTEC) 5 MG tablet Take 5 mg by mouth at bedtime.   Yes [provider]  clonazePAM (KLONOPIN) 1 MG tablet Take 1 tablet (1 mg total) by mouth 2 (two) times daily. 05/05/21  Yes Cameron Sprang, MD  KEPPRA 1000 MG tablet Take 2 tablets every night Patient taking differently: Take 2,000 mg by mouth 2 (two) times daily. 04/18/21  Yes Cameron Sprang, MD  lidocaine (LIDODERM) 5 % Place 1 patch onto the skin daily. Remove & Discard patch within 12 hours or as directed by MD Patient taking  differently: Place 1 patch onto the skin daily as needed (pain). Remove & Discard patch within 12 hours or as directed by MD 02/25/20  Yes Henderly, Britni A, PA-C  oxyCODONE (ROXICODONE) 5 MG immediate release tablet Take 1 tablet (5 mg total) by mouth every 4 (four) hours as needed. Patient taking differently: Take 5 mg by mouth every 4 (four) hours as needed for moderate pain. 05/05/21 05/05/22 Yes Magnant, Charles L, PA-C  topiramate (TOPAMAX) 200 MG tablet Take 1 tablet every night Patient taking differently: Take 200 mg by mouth 2 (two) times daily. 04/16/21  Yes Cameron Sprang, MD  EPINEPHrine 0.3 mg/0.3 mL IJ SOAJ injection Inject 0.3 mg into the muscle as needed for anaphylaxis. Patient not taking: Reported on 06/20/2021 07/10/20   McDonald,  Mia A, PA-C  methocarbamol (ROBAXIN) 500 MG tablet Take 1 tablet (500 mg total) by mouth every 8 (eight) hours as needed. Patient taking differently: Take 500 mg by mouth every 8 (eight) hours as needed for muscle spasms. 05/05/21   Magnant, Gerrianne Scale, PA-C  promethazine (PHENERGAN) 25 MG tablet Take 1 tablet (25 mg total) by mouth every 8 (eight) hours as needed for nausea or vomiting. 06/10/21   Antonietta Breach, PA-C    Allergies    Lortab [hydrocodone-acetaminophen], Other, Zofran [name] hcl], Citrus, Dairy aid [tilactase], Influenza virus vaccine, Soy allergy, Eggs or egg-derived products, and Hydrocodone-acetaminophen  Review of Systems   Review of Systems  Unable to perform ROS: Mental status change  Constitutional:  Positive for fever.  Psychiatric/Behavioral:  Positive for confusion.    Physical Exam Updated Vital Signs BP 108/60   Pulse 93   Temp (!) 100.8 F (38.2 C) (Oral)   Resp 16   Ht 6' (1.829 m)   Wt 68 kg   SpO2 98%   BMI 20.34 kg/m   Physical Exam Vitals and nursing note reviewed.  Constitutional:      Appearance: He is not ill-appearing or diaphoretic.     Comments: Slurred speech  HENT:     Head:  Normocephalic.     Comments: Swelling noted to right lower lip with small superficial laceration to inner aspect    Mouth/Throat:     Mouth: Mucous membranes are moist.     Pharynx: No oropharyngeal exudate or posterior oropharyngeal erythema.  Eyes:     Extraocular Movements: Extraocular movements intact.     Conjunctiva/sclera: Conjunctivae normal.     Pupils: Pupils are equal, round, and reactive to light.  Cardiovascular:     Rate and Rhythm: Normal rate and regular rhythm.     Pulses: Normal pulses.  Pulmonary:     Effort: Pulmonary effort is normal.     Breath sounds: Normal breath sounds. No wheezing, rhonchi or rales.  Abdominal:     Palpations: Abdomen is soft.     Tenderness: There is no abdominal tenderness. There is no guarding or rebound.  Musculoskeletal:     Cervical back: Neck supple. No rigidity.     Comments: Surgical incisions to bilateral shoulders. Right shoulder swollen with TTP. ROM limited (baseline per mother). No erythema or increased warmth to the shoulder itself.   Skin:    General: Skin is warm and dry.  Neurological:     Mental Status: He is alert.     Comments: Confused however responsive to verbal stimuli. Alert to self currently. Moving all extremities without difficulty. Following commands.     ED Results / Procedures / Treatments   Labs (all labs ordered are listed, but only abnormal results are displayed) Labs Reviewed  COMPREHENSIVE METABOLIC PANEL - Abnormal; Notable for the following components:      Result Value   AST 51 (*)    All other components within normal limits  CBC WITH DIFFERENTIAL/PLATELET - Abnormal; Notable for the following components:   WBC 14.9 (*)    Neutro Abs 13.6 (*)    Lymphs Abs 0.5 (*)    All other components within normal limits  ACETAMINOPHEN LEVEL - Abnormal; Notable for the following components:   Acetaminophen (Tylenol), Serum <10 (*)    All other components within normal limits  LACTIC ACID, PLASMA -  Abnormal; Notable for the following components:   Lactic Acid, Venous 2.3 (*)    All other components  within normal limits  SALICYLATE LEVEL - Abnormal; Notable for the following components:   Salicylate Lvl <5.8 (*)    All other components within normal limits  CK - Abnormal; Notable for the following components:   Total CK 984 (*)    All other components within normal limits  URINALYSIS, ROUTINE W REFLEX MICROSCOPIC - Abnormal; Notable for the following components:   APPearance HAZY (*)    Hgb urine dipstick MODERATE (*)    Ketones, ur 20 (*)    Protein, ur 30 (*)    All other components within normal limits  RESP PANEL BY RT-PCR (FLU A&B, COVID) ARPGX2  CULTURE, BLOOD (ROUTINE X 2)  CULTURE, BLOOD (ROUTINE X 2)  URINE CULTURE  CSF CULTURE W GRAM STAIN  GRAM STAIN  ETHANOL  RAPID URINE DRUG SCREEN, HOSP PERFORMED  LEVETIRACETAM LEVEL  TOPIRAMATE LEVEL  LACTIC ACID, PLASMA  CSF CELL COUNT WITH DIFFERENTIAL  CSF CELL COUNT WITH DIFFERENTIAL  GLUCOSE, CSF  PROTEIN, CSF  HSV 1/2 PCR, CSF  VDRL, CSF  ARBOVIRUS IGG, CSF  HIV ANTIBODY (ROUTINE TESTING W REFLEX)  CBG MONITORING, ED    EKG None  Radiology CT Head Wo Contrast  Addendum Date: 06/20/2021   ADDENDUM REPORT: 06/20/2021 02:48 ADDENDUM: Basilar cisterns are patent. No mass effect. Images are obliqued on CT axial. These results were called by telephone at the time of interpretation on 06/20/2021 at 2:44 am to provider Dr. Christy Gentles, who verbally acknowledged these results. Electronically Signed   By: Iven Finn M.D.   On: 06/20/2021 02:48   Result Date: 06/20/2021 CLINICAL DATA:  Mental status change. Unknown cause. Overdose of unknown substance suspected. EXAM: CT HEAD WITHOUT CONTRAST TECHNIQUE: Contiguous axial images were obtained from the base of the skull through the vertex without intravenous contrast. COMPARISON:  None. FINDINGS: Brain: No evidence of large-territorial acute infarction. No parenchymal  hemorrhage. No mass lesion. No extra-axial collection. No mass effect or midline shift. No hydrocephalus. Basilar cisterns are patent. Vascular: No hyperdense vessel. Skull: No acute fracture or focal lesion. Sinuses/Orbits: Paranasal sinuses and mastoid air cells are clear. The orbits are unremarkable. Other: None. IMPRESSION: Negative for acute traumatic injury. Electronically Signed: By: Iven Finn M.D. On: 06/20/2021 01:21   DG Chest Port 1 View  Result Date: 06/20/2021 CLINICAL DATA:  Rule out aspiration.  Altered mental status. EXAM: PORTABLE CHEST 1 VIEW COMPARISON:  06/10/2021 FINDINGS: The heart size and mediastinal contours are within normal limits. No consolidation, effusion, or pneumothorax. No acute osseous abnormality. IMPRESSION: No acute cardiopulmonary process. Electronically Signed   By: Brett Fairy M.D.   On: 06/20/2021 00:59    Procedures .Critical Care Performed by: Eustaquio Maize, PA-C Authorized by: Eustaquio Maize, PA-C   Critical care provider statement:    Critical care time (minutes):  45   Critical care was time spent personally by me on the following activities:  Development of treatment plan with patient or surrogate, discussions with consultants, evaluation of patient's response to treatment, examination of patient, ordering and review of laboratory studies, ordering and review of radiographic studies, ordering and performing treatments and interventions, pulse oximetry, re-evaluation of patient's condition and review of old charts   Medications Ordered in ED Medications  cefTRIAXone (ROCEPHIN) 2 g in sodium chloride 0.9 % 100 mL IVPB (2 g Intravenous New Bag/Given 06/20/21 0403)  vancomycin (VANCOCIN) IVPB 1000 mg/200 mL premix (has no administration in time range)  naloxone [facility]) injection 0.4 mg (0.4 mg Intravenous Given 06/20/21 0038)  acetaminophen (TYLENOL) tablet 1,000 mg (1,000 mg Oral Given 06/20/21 0132)  sodium chloride 0.9 % bolus 1,000 mL  (0 mLs Intravenous Stopped 06/20/21 0310)    ED Course  I have reviewed the triage vital signs and the nursing notes.  Pertinent labs & imaging results that were available during my care of the patient were reviewed by me and considered in my medical decision making (see chart for details).    MDM Rules/Calculators/A&P                           20 year old male who presents to the ED today with concern for possible overdose.  Mom called EMS after she found patient walking around kitchen without clothes on.  She had found urine on floor in his bedroom.  He had reported that he believed he took too much medication however he did not elaborate to mom.  Patient not cooperative with exam at this time.  He is alert to self only.  He is slurring his words.  He is however able to follow commands and is positive to verbal stimuli.  He is noted to be very warm to the touch and oral temperature at 101.9.  Mom does not believe patient had seizure tonight however he has a history of same.  We will plan for a Keppra level and topiramate level at this time.  We will plan to work-up for altered mental status including CT head, x-ray to rule out aspiration, EtOH, UDS, acetaminophen/salicylate level.  Per chart review patient does take oxycodone daily.  Will provide Narcan and see if patient responds to same.  Also provide Tylenol for fever.  Question if he is febrile from potential seizure versus other etiology.  We will plan for COVID and flu testing at this time.  Question if patient could have potentially overdosed on Tylenol or other over-the-counter medications.   No response to narcan UDS has returned negative  CT Head without acute abnormality CXR clear   CBC with leukocytosis 14,900; no left shift CMP without electrolyte abnormalities. AST slightly elevated at 51. Abdomen soft and nontender CK elevated at 984; fluids running COVID and flu negative  Lactic acid elevated at 2.3 EtOH negative.   Salicylate and Tylenol level negative  Difficult to assess where patient's fever is coming from. He is a very poor historian. He currently is complaining of a headache and a sore throat. His posterior oropharynx is clear without erythema, edema, or exudate. Tolerating secretions without difficulty. CN's intact. Normal finger to nose. No nystagmus. Negative Brudzinki's and Kernig's sign. Pt was ambulated and seemed to be leaning towards the left side; question potential for meningitis? He did recently have surgery on his R shoulder - does appear swollen on exam however mom states that it does not appear more swollen than normal. The shoulder is not erythematous and does not have any increased warmth to the touch however pt does have limited ROM to same. Difficult to assess if this is new since surgery or baseline. Will plan for LP at this time to rule out meningitis; will start on rocephin and vanc. Procedure discussed with patient and mom at bedside who are in agreement to proceed.   Lumbar puncture performed. Please see attending physician Dr. Raechel Chute procedure note. Will call for admission at this time.   Discussed case with Dr. Hal Hope who agrees to accept patient for admission.   This note was prepared using Systems analyst  and may include unintentional dictation errors due to the inherent limitations of voice recognition software.   Final Clinical Impression(s) / ED Diagnoses Final diagnoses:  Altered mental status, unspecified altered mental status type  Febrile illness    Rx / DC Orders ED Discharge Orders     None        Eustaquio Maize, PA-C 06/20/21 0408    Ripley Fraise, MD 06/20/21 [phone]

## 2021-06-20 NOTE — ED Notes (Signed)
Provider at bedside for procedure.

## 2021-06-20 NOTE — Progress Notes (Signed)
Pharmacy Antibiotic Note  Terrence Riley is a 20 y.o. male admitted on 06/20/2021 with r/o meningitis.  Pharmacy has been consulted for Vancomycin and Acyclovir dosing.  Plan: Vanc 1gm IV x 1 already started in the ED. Will give an additional Vancomycin 500mg  IV at the end of 1gm for a total loading dose of 1500mg  IV.  Then continue with Vancomycin 1250 mg IV Q 12 hrs. Goal AUC 400-550.  Expected AUC: 587.5  SCr used: 1.14 Acyclovir 10 mg/kg IV q8h Ceftriaxone 2gm IV q12h per MD F/U culture results Follow renal function  Height: 6' (182.9 cm) Weight: 68 kg (150 lb) IBW/kg (Calculated) : 77.6  Temp (24hrs), Avg:101.4 F (38.6 C), Min:100.8 F (38.2 C), Max:101.9 F (38.8 C)  Recent Labs  Lab 06/20/21 0042 06/20/21 0401  WBC 14.9*  --   CREATININE 1.14  --   LATICACIDVEN 2.3* 1.3    Estimated Creatinine Clearance: 99.4 mL/min (by C-G formula based on SCr of 1.14 mg/dL).    Allergies  Allergen Reactions   Lortab [Hydrocodone-Acetaminophen] Hives   Other Anaphylaxis    Peanuts   Zofran [Ondansetron Hcl] Nausea And Vomiting   Citrus Other (See Comments)    Sores in mouth   Dairy Aid [Tilactase] Nausea And Vomiting   Influenza Virus Vaccine Other (See Comments)    Partial paralysis for a couple of weeks per mom   Soy Allergy Other (See Comments)    Seizures    Eggs Or Egg-Derived Products Rash    Pt mother reports pt only avoids eggs alone but can tolerate as an ingredient. [facility] 07/27/13   Hydrocodone-Acetaminophen Rash    Antimicrobials this admission: 11/25 Acyclovir >>   11/25 Ceftriaxone >>   11/25 Vancomycin >>  Dose adjustments this admission:    Microbiology results: 11/25 BCx:   11/25 UCx:    11/25 CSF Cx:      Thank you for allowing pharmacy to be a part of this patient's care.  Everette Rank, PharmD 06/20/2021 4:59 AM

## 2021-06-21 DIAGNOSIS — G9340 Encephalopathy, unspecified: Secondary | ICD-10-CM | POA: Diagnosis not present

## 2021-06-21 LAB — URINE CULTURE: Culture: NO GROWTH

## 2021-06-21 LAB — GLUCOSE, CAPILLARY
Glucose-Capillary: 74 mg/dL (ref 70–99)
Glucose-Capillary: 85 mg/dL (ref 70–99)
Glucose-Capillary: 142 mg/dL — ABNORMAL HIGH (ref 70–99)

## 2021-06-21 MED ORDER — SODIUM CHLORIDE 0.9 % IV SOLN
INTRAVENOUS | Status: DC
  Administered 2021-06-22: 11:00:00 1000 mL via INTRAVENOUS

## 2021-06-21 MED ORDER — NON FORMULARY: 2000.0000 mg | Freq: Two times a day (BID) | Status: DC

## 2021-06-21 MED ORDER — METHOCARBAMOL 1000 MG/10ML IJ SOLN
500.0000 mg | Freq: Once | INTRAVENOUS | Status: AC
  Administered 2021-06-21: 500 mg via INTRAVENOUS
  Filled 2021-06-21: qty 500

## 2021-06-21 MED ORDER — NONFORMULARY OR COMPOUNDED ITEM
200.0000 mg | Freq: Two times a day (BID) | Status: DC
  Administered 2021-06-21 – 2021-06-24 (×7): 200 mg via ORAL
  Filled 2021-06-21 (×2): qty 1

## 2021-06-21 MED ORDER — TOPIRAMATE 100 MG PO TABS
200.0000 mg | ORAL_TABLET | Freq: Two times a day (BID) | ORAL | Status: DC
  Administered 2021-06-21: 200 mg via ORAL
  Filled 2021-06-21: qty 2

## 2021-06-21 MED ORDER — TRAMADOL HCL 50 MG PO TABS: 50.0000 mg | ORAL_TABLET | Freq: Once | ORAL | Status: DC

## 2021-06-21 MED ORDER — TOPIRAMATE 100 MG PO TABS
200.0000 mg | ORAL_TABLET | Freq: Two times a day (BID) | ORAL | Status: DC
Start: 2021-06-21 — End: 2021-06-21

## 2021-06-21 MED ORDER — NONFORMULARY OR COMPOUNDED ITEM
2000.0000 mg | Freq: Two times a day (BID) | Status: DC
  Administered 2021-06-21 – 2021-06-27 (×12): 2000 mg via ORAL
  Filled 2021-06-21 (×6): qty 1

## 2021-06-21 MED ORDER — CLONAZEPAM 0.5 MG PO TABS
1.0000 mg | ORAL_TABLET | Freq: Two times a day (BID) | ORAL | Status: DC
  Administered 2021-06-21 – 2021-06-24 (×7): 1 mg via ORAL
  Filled 2021-06-21 (×6): qty 1

## 2021-06-21 MED ORDER — NON FORMULARY: 200.0000 mg | Freq: Two times a day (BID) | Status: DC

## 2021-06-21 MED ORDER — LEVETIRACETAM 500 MG PO TABS: 2000.0000 mg | ORAL_TABLET | Freq: Two times a day (BID) | ORAL | Status: DC

## 2021-06-21 NOTE — Consult Note (Shared)
Neurology Consultation  Reason for Consult: Seizures Referring Physician: Dr. Wynelle Cleveland   CC: AMS  History is obtained from:medical record  HPI: Terrence Riley is a 20 y.o. male with past medical history of Asthma, Seizures, who presents to the ED today via EMS for complaint of possible drug overdose/AMS.  Per EMS patient's mother called out due to suspected overdose however unknown from what. Mom reports that the last time she saw him earlier today was around 3 PM.  She went to check on him prior to going to sleep and saw that there was urine all over the floor in his bedroom.  She found him downstairs without clothes on and states that he seemed very confused and out of it.  She asked him what was going on and he was able to relay to her that he believes he took too much medication however he did not relay what type of medication he took. He was found to be febrile in the ED at 101F, LP, blood cultures CXR and UA were performed  This am patient is asleep, easily awakens to voice but still very lethargic and delayed responses. No family at bedside. Patient is unsure if he missed any doses of his AED's or if he had been taking to much. He can not tell me why he is in the hospital. RN at bedside and informs me that he is much more alert and interactive today   ROS: Full ROS was performed and is negative except as noted in the HPI. Marland Kitchen   Past Medical History:  Diagnosis Date   Allergy    rhinitis   Asthma    as a child   Complication of anesthesia    pt. reports he need more anesthesia with the septoplasty surgery   Eosinophilic esophagitis    heartburn and reflux   Family history of adverse reaction to anesthesia    mother respiratory failure   Headache    Nasal fracture    deviated septum   Pneumonia    1x history of   Premature baby    2 months early   Seizures ([place])    well controlled on meds/ one 2 months ago when meds were late    Family History  Problem Relation Age of Onset    Cancer Mother        breat   Protein S deficiency Mother      Social History:   reports that he has never smoked. He has never used smokeless tobacco. He reports that he does not drink alcohol and does not use drugs.  Medications  Current Facility-Administered Medications:    0.9 %  sodium chloride infusion, , Intravenous, Continuous, Rizwan, Saima, MD, Last Rate: 100 mL/hr at 06/21/21 1232, Rate Change at 06/21/21 1232   acetaminophen (TYLENOL) tablet 650 mg, 650 mg, Oral, Q6H PRN **OR** acetaminophen (TYLENOL) suppository 650 mg, 650 mg, Rectal, Q6H PRN, Rise Patience, MD   acyclovir (ZOVIRAX) 680 mg in dextrose 5 % 100 mL IVPB, 10 mg/kg, Intravenous, Q8H, Poindexter, Leann T, RPH, Last Rate: 113.6 mL/hr at 06/21/21 1001, 680 mg at 06/21/21 1001   clonazePAM (KLONOPIN) tablet 1 mg, 1 mg, Oral, BID, Rizwan, Saima, MD   levETIRAcetam (KEPPRA) 2,000 mg in sodium chloride 0.9 % 250 mL IVPB, 2,000 mg, Intravenous, Q12H, Rise Patience, MD, Last Rate: 900 mL/hr at 06/21/21 0523, 2,000 mg at 06/21/21 0523   piperacillin-tazobactam (ZOSYN) IVPB 3.375 g, 3.375 g, Intravenous, Q8H, Dimple Nanas, RPH,  Last Rate: 12.5 mL/hr at 06/21/21 1229, 3.375 g at 06/21/21 1229   topiramate (TOPAMAX) tablet 200 mg, 200 mg, Oral, BID, Debbe Odea, MD   Exam: Current vital signs: BP 117/63 (BP Location: Right Arm)    Pulse 78    Temp 98.6 F (37 C) (Oral)    Resp 18    Ht 6' (1.829 m)    Wt 68 kg    SpO2 100%    BMI 20.34 kg/m  Vital signs in last 24 hours: Temp:  [98.2 F (36.8 C)-98.7 F (37.1 C)] 98.6 F (37 C) (11/26 0518) Pulse Rate:  [77-80] 78 (11/26 0518) Resp:  [12-20] 18 (11/26 0255) BP: (95-120)/(42-72) 117/63 (11/26 0518) SpO2:  [98 %-100 %] 100 % (11/26 0518)  GENERAL: sleeping in NAD HEENT: - Normocephalic and atraumatic, dry mm LUNGS - Clear to auscultation bilaterally with no wheezes CV - S1S2 RRR, no m/r/g, equal pulses bilaterally. ABDOMEN - Soft, nontender,  nondistended with normoactive BS Ext: warm, well perfused, intact peripheral pulses,no edema  NEURO:  Mental Status: AA&Ox3.  Language: speech is clear.  Delayed response. Naming, repetition, fluency, and comprehension intact. Cranial Nerves: PERRL 36mm/brisk. EOMI, visual fields full, no facial asymmetry, facial sensation intact, hearing intact, tongue/uvula/soft palate midline, normal sternocleidomastoid and trapezius muscle strength. No evidence of tongue atrophy or fibrillations Motor: 5/5 in all 4 extremities Tone: is normal and bulk is normal Sensation- Intact to light touch bilaterally Coordination: FTN intact bilaterally, no ataxia in BLE. Gait- deferred   Labs I have reviewed labs in epic and the results pertinent to this consultation are:   CBC    Component Value Date/Time   WBC 12.7 (H) 06/20/2021 0554   RBC 4.24 06/20/2021 0554   HGB 12.3 (L) 06/20/2021 0554   HGB 13.4 03/07/2014 2009   HCT 35.6 (L) 06/20/2021 0554   HCT 39.8 (L) 03/07/2014 2009   PLT 176 06/20/2021 0554   PLT 166 03/07/2014 2009   MCV 84.0 06/20/2021 0554   MCV 85 03/07/2014 2009   MCH 29.0 06/20/2021 0554   MCHC 34.6 06/20/2021 0554   RDW 12.2 06/20/2021 0554   RDW 13.6 03/07/2014 2009   LYMPHSABS 1.8 06/20/2021 0554   LYMPHSABS 1.3 03/07/2014 2009   MONOABS 1.1 (H) 06/20/2021 0554   MONOABS 0.4 03/07/2014 2009   EOSABS 0.0 06/20/2021 0554   EOSABS 0.2 03/07/2014 2009   BASOSABS 0.0 06/20/2021 0554   BASOSABS 0.1 03/07/2014 2009    CMP     Component Value Date/Time   NA 135 06/20/2021 0554   NA 138 03/07/2014 2009   K 3.6 06/20/2021 0554   K 3.8 03/07/2014 2009   CL 107 06/20/2021 0554   CL 106 03/07/2014 2009   CO2 21 (L) 06/20/2021 0554   CO2 26 (H) 03/07/2014 2009   GLUCOSE 98 06/20/2021 0554   GLUCOSE 88 03/07/2014 2009   BUN 16 06/20/2021 0554   BUN 11 03/07/2014 2009   CREATININE 0.93 06/20/2021 0554   CREATININE 1.08 03/12/2020 1004   CALCIUM 8.4 (L) 06/20/2021 0554    CALCIUM 9.1 03/07/2014 2009   PROT 6.4 (L) 06/20/2021 0554   PROT 6.6 03/07/2014 2009   ALBUMIN 3.9 06/20/2021 0554   ALBUMIN 3.9 03/07/2014 2009   AST 33 06/20/2021 0554   AST 26 03/07/2014 2009   ALT 28 06/20/2021 0554   ALT 27 03/07/2014 2009   ALKPHOS 62 06/20/2021 0554   ALKPHOS 295 (H) 03/07/2014 2009   BILITOT 0.7 06/20/2021 0554  BILITOT 0.4 03/07/2014 2009   GFRNONAA >60 06/20/2021 0554   GFRAA >60 04/29/2020 1011    Lipid Panel     Component Value Date/Time   CHOL 125 03/12/2020 1004   TRIG 51 03/12/2020 1004   HDL 49 03/12/2020 1004   CHOLHDL 2.6 03/12/2020 1004   LDLCALC 63 03/12/2020 1004     Imaging I have reviewed the images obtained:  CT-head No acute abnormality   MRI examination of the brain  Assessment:  Terrence T Edmister is a 20 y.o. male with past medical history of Asthma, Seizures, who presents to the ED today via EMS for complaint of possible drug overdose/AMS.  Per EMS patient's mother called out due to suspected overdose however unknown from what.  LP with RBC 31, WBC 1, protein 18 which is not consistent with infection  CSF culture, HSV, VDRL pending  UA NGTD UA, UDS negative  CT Chest Left upper lobe pneumonia EEG Intermittent slow, generalized, Excessive beta, generalized. No seizures   Acute encephalopathy due to fever and leukocytosis/PNA vs accidental overdose vs seizure/post ictal. Given that seizures this presentation were provoked, he may be discharged on home medications at home doses. Because he has a history of epilepsy and this seizure was provoked, no indication for repeat EEG or MRI brain at this time.  Recommendations: Continue acyclovir until CSF HSV results negative (hospitalist may f/u) Continue current AEDs.  When patient is alert enough to take po, restart topiramate at home dose, then d/c vimpat 2 days after that - Continue keppra - F/u with established outpatient neurologist  Beulah Gandy, NP

## 2021-06-21 NOTE — Consult Note (Deleted)
Neurology Consultation  Reason for Consult: Seizures Referring Physician: Dr. Wynelle Cleveland   CC: AMS  History is obtained from:medical record  HPI: Terrence Riley is a 20 y.o. male with past medical history of Asthma, Seizures, who presents to the ED today via EMS for complaint of possible drug overdose/AMS.  Per EMS patient's mother called out due to suspected overdose however unknown from what. Mom reports that the last time she saw him earlier today was around 3 PM.  She went to check on him prior to going to sleep and saw that there was urine all over the floor in his bedroom.  She found him downstairs without clothes on and states that he seemed very confused and out of it.  She asked him what was going on and he was able to relay to her that he believes he took too much medication however he did not relay what type of medication he took. He was found to be febrile in the ED at 101F, LP, blood cultures CXR and UA were performed  This am patient is asleep, easily awakens to voice but still very lethargic and delayed responses. No family at bedside. Patient is unsure if he missed any doses of his AED's or if he had been taking to much. He can not tell me why he is in the hospital. RN at bedside and informs me that he is much more alert and interactive today   ROS: Full ROS was performed and is negative except as noted in the HPI. Marland Kitchen   Past Medical History:  Diagnosis Date   Allergy    rhinitis   Asthma    as a child   Complication of anesthesia    pt. reports he need more anesthesia with the septoplasty surgery   Eosinophilic esophagitis    heartburn and reflux   Family history of adverse reaction to anesthesia    mother respiratory failure   Headache    Nasal fracture    deviated septum   Pneumonia    1x history of   Premature baby    2 months early   Seizures ([place])    well controlled on meds/ one 2 months ago when meds were late    Family History  Problem Relation Age of Onset    Cancer Mother        breat   Protein S deficiency Mother      Social History:   reports that he has never smoked. He has never used smokeless tobacco. He reports that he does not drink alcohol and does not use drugs.  Medications  Current Facility-Administered Medications:    acetaminophen (TYLENOL) tablet 650 mg, 650 mg, Oral, Q6H PRN **OR** acetaminophen (TYLENOL) suppository 650 mg, 650 mg, Rectal, Q6H PRN, Rise Patience, MD   acyclovir (ZOVIRAX) 680 mg in dextrose 5 % 100 mL IVPB, 10 mg/kg, Intravenous, Q8H, Poindexter, Leann T, RPH, Last Rate: 113.6 mL/hr at 06/21/21 0015, 680 mg at 06/21/21 0015   lacosamide (VIMPAT) 100 mg in sodium chloride 0.9 % 25 mL IVPB, 100 mg, Intravenous, Q12H, Rise Patience, MD, Last Rate: 70 mL/hr at 06/20/21 1748, 100 mg at 06/20/21 1748   levETIRAcetam (KEPPRA) 2,000 mg in sodium chloride 0.9 % 250 mL IVPB, 2,000 mg, Intravenous, Q12H, Rise Patience, MD, Last Rate: 900 mL/hr at 06/21/21 0523, 2,000 mg at 06/21/21 0523   LORazepam (ATIVAN) injection 0.5 mg, 0.5 mg, Intravenous, Q12H, Rizwan, Saima, MD, 0.5 mg at 06/20/21 2314  piperacillin-tazobactam (ZOSYN) IVPB 3.375 g, 3.375 g, Intravenous, Q8H, Dimple Nanas, RPH, Last Rate: 12.5 mL/hr at 06/21/21 0457, Infusion Verify at 06/21/21 0457   traMADol (ULTRAM) tablet 50 mg, 50 mg, Oral, Once, Blount, Lolita Cram, NP   Exam: Current vital signs: BP 117/63 (BP Location: Right Arm)   Pulse 78   Temp 98.6 F (37 C) (Oral)   Resp 18   Ht 6' (1.829 m)   Wt 68 kg   SpO2 100%   BMI 20.34 kg/m  Vital signs in last 24 hours: Temp:  [98 F (36.7 C)-98.7 F (37.1 C)] 98.6 F (37 C) (11/26 0518) Pulse Rate:  [67-83] 78 (11/26 0518) Resp:  [12-20] 18 (11/26 0255) BP: (95-120)/(42-82) 117/63 (11/26 0518) SpO2:  [97 %-100 %] 100 % (11/26 0518)  GENERAL: sleeping in NAD HEENT: - Normocephalic and atraumatic, dry mm LUNGS - Clear to auscultation bilaterally with no wheezes CV -  S1S2 RRR, no m/r/g, equal pulses bilaterally. ABDOMEN - Soft, nontender, nondistended with normoactive BS Ext: warm, well perfused, intact peripheral pulses,no edema  NEURO:  Mental Status: AA&Ox3.  Language: speech is clear.  Delayed response. Naming, repetition, fluency, and comprehension intact. Cranial Nerves: PERRL 32mm/brisk. EOMI, visual fields full, no facial asymmetry, facial sensation intact, hearing intact, tongue/uvula/soft palate midline, normal sternocleidomastoid and trapezius muscle strength. No evidence of tongue atrophy or fibrillations Motor: 5/5 in all 4 extremities Tone: is normal and bulk is normal Sensation- Intact to light touch bilaterally Coordination: FTN intact bilaterally, no ataxia in BLE. Gait- deferred   Labs I have reviewed labs in epic and the results pertinent to this consultation are:   CBC    Component Value Date/Time   WBC 12.7 (H) 06/20/2021 0554   RBC 4.24 06/20/2021 0554   HGB 12.3 (L) 06/20/2021 0554   HGB 13.4 03/07/2014 2009   HCT 35.6 (L) 06/20/2021 0554   HCT 39.8 (L) 03/07/2014 2009   PLT 176 06/20/2021 0554   PLT 166 03/07/2014 2009   MCV 84.0 06/20/2021 0554   MCV 85 03/07/2014 2009   MCH 29.0 06/20/2021 0554   MCHC 34.6 06/20/2021 0554   RDW 12.2 06/20/2021 0554   RDW 13.6 03/07/2014 2009   LYMPHSABS 1.8 06/20/2021 0554   LYMPHSABS 1.3 03/07/2014 2009   MONOABS 1.1 (H) 06/20/2021 0554   MONOABS 0.4 03/07/2014 2009   EOSABS 0.0 06/20/2021 0554   EOSABS 0.2 03/07/2014 2009   BASOSABS 0.0 06/20/2021 0554   BASOSABS 0.1 03/07/2014 2009    CMP     Component Value Date/Time   NA 135 06/20/2021 0554   NA 138 03/07/2014 2009   K 3.6 06/20/2021 0554   K 3.8 03/07/2014 2009   CL 107 06/20/2021 0554   CL 106 03/07/2014 2009   CO2 21 (L) 06/20/2021 0554   CO2 26 (H) 03/07/2014 2009   GLUCOSE 98 06/20/2021 0554   GLUCOSE 88 03/07/2014 2009   BUN 16 06/20/2021 0554   BUN 11 03/07/2014 2009   CREATININE 0.93 06/20/2021  0554   CREATININE 1.08 03/12/2020 1004   CALCIUM 8.4 (L) 06/20/2021 0554   CALCIUM 9.1 03/07/2014 2009   PROT 6.4 (L) 06/20/2021 0554   PROT 6.6 03/07/2014 2009   ALBUMIN 3.9 06/20/2021 0554   ALBUMIN 3.9 03/07/2014 2009   AST 33 06/20/2021 0554   AST 26 03/07/2014 2009   ALT 28 06/20/2021 0554   ALT 27 03/07/2014 2009   ALKPHOS 62 06/20/2021 0554   ALKPHOS 295 (H) 03/07/2014 2009  BILITOT 0.7 06/20/2021 0554   BILITOT 0.4 03/07/2014 2009   GFRNONAA >60 06/20/2021 0554   GFRAA >60 04/29/2020 1011    Lipid Panel     Component Value Date/Time   CHOL 125 03/12/2020 1004   TRIG 51 03/12/2020 1004   HDL 49 03/12/2020 1004   CHOLHDL 2.6 03/12/2020 1004   LDLCALC 63 03/12/2020 1004     Imaging I have reviewed the images obtained:  CT-head No acute abnormality   MRI examination of the brain  Assessment:  Terrence Riley is a 20 y.o. male with past medical history of Asthma, Seizures, who presents to the ED today via EMS for complaint of possible drug overdose/AMS.  Per EMS patient's mother called out due to suspected overdose however unknown from what.  LP with RBC 31, WBC 1, protein 18 which is not consistent with infection  CSF culture, HSV, VDRL pending  UA NGTD UA, UDS negative  CT Chest Left upper lobe pneumonia EEG Intermittent slow, generalized, Excessive beta, generalized. No seizures   Acute encephalopathy due to fever and leukocytosis/PNA vs accidental overdose vs seizure/post ictal. Given that seizures this presentation were provoked, he may be discharged on home medications at home doses. Because he has a history of epilepsy and this seizure was provoked, no indication for repeat EEG or MRI brain at this time.  Recommendations: Continue acyclovir until CSF HSV results negative (hospitalist may f/u) Continue current AEDs.  When patient is alert enough to take po, restart topiramate at home dose, then d/c vimpat 2 days after that - Continue keppra - F/u  with established outpatient neurologist  Beulah Gandy, NP

## 2021-06-21 NOTE — Consult Note (Signed)
Neurology Consultation  Reason for Consult: Seizures Referring Physician: Dr. Wynelle Cleveland   CC: AMS  History is obtained from:medical record  HPI: Terrence Riley is a 20 y.o. male with past medical history of Asthma, Seizures, who presents to the ED today via EMS for complaint of possible drug overdose/AMS.  Per EMS patient's mother called out due to suspected overdose however unknown from what. Mom reports that the last time she saw him earlier today was around 3 PM.  She went to check on him prior to going to sleep and saw that there was urine all over the floor in his bedroom.  She found him downstairs without clothes on and states that he seemed very confused and out of it.  She asked him what was going on and he was able to relay to her that he believes he took too much medication however he did not relay what type of medication he took. He was found to be febrile in the ED at 101F, LP, blood cultures CXR and UA were performed.  This am patient is asleep, easily awakens to voice but still very lethargic and delayed responses. No family at bedside. Patient is unsure if he missed any doses of his AED's or if he had been taking to much. He can not tell me why he is in the hospital. RN at bedside and informs me that he is much more alert and interactive today   Later attending evaluation, mother is at bedside and confirms the history above.  She notes he has never had anything like this happen before.  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  She works as a Education administrator in the [employer] and was concerned about the classification of Vimpat as a narcotic and its interaction with the  clonazepam he takes at home.  She notes that she checked all of his pill counts since his hospitalization and they were accurate, with the appropriate amount missing, although she understands his UDS was negative for benzodiazepines (suggestive of him not taking his clonazepam).  She reports thoroughly searching his room and also talking with all of his friends without discovering any evidence or concern for any substance use.  On review of outpatient records, there was some concern that he was not taking his medications as prescribed "He is listed as taking Topiramate 200mg  BID but only takes 200mg  qhs, Levetiracetam 1000mg  BID (taking 2000mg  qhs), and clonazepam 1mg  BID."  His mother notes that due to HIPAA concerns and respect his privacy she does not accompany him to appointments and is not aware of his current neurologists thoughts and concerns.   ROS: Full ROS was performed and is negative except as noted in the HPI. Marland Kitchen   Past Medical History:  Diagnosis Date   Allergy    rhinitis   Asthma    as a child   Complication of anesthesia    pt. reports he need more anesthesia with the septoplasty surgery   Eosinophilic esophagitis    heartburn and reflux   Family history of adverse reaction to anesthesia    mother respiratory failure   Headache    Nasal fracture    deviated septum  Pneumonia    1x history of   Premature baby    2 months early   Seizures ([place])    well controlled on meds/ one 2 months ago when meds were late    Family History  Problem Relation Age of Onset   Cancer Mother        breat   Protein S deficiency Mother      Social History:   reports that he has never smoked. He has never used smokeless tobacco. He reports that he does not drink alcohol and does not use drugs.  Medications  Current Facility-Administered Medications:    0.9 %  sodium chloride infusion, , Intravenous, Continuous, Rizwan, Saima, MD, Last Rate: 100 mL/hr at 06/21/21 1232, Rate Change  at 06/21/21 1232   acetaminophen (TYLENOL) tablet 650 mg, 650 mg, Oral, Q6H PRN, 650 mg at 06/21/21 1815 **OR** acetaminophen (TYLENOL) suppository 650 mg, 650 mg, Rectal, Q6H PRN, Rise Patience, MD   acyclovir (ZOVIRAX) 680 mg in dextrose 5 % 100 mL IVPB, 10 mg/kg, Intravenous, Q8H, Poindexter, Leann T, RPH, Last Rate: 113.6 mL/hr at 06/21/21 1810, 680 mg at 06/21/21 1810   clonazePAM (KLONOPIN) tablet 1 mg, 1 mg, Oral, BID, Rizwan, Saima, MD   levETIRAcetam (KEPPRA) 2,000 mg in sodium chloride 0.9 % 250 mL IVPB, 2,000 mg, Intravenous, Q12H, Rise Patience, MD, Held at 06/21/21 1816   piperacillin-tazobactam (ZOSYN) IVPB 3.375 g, 3.375 g, Intravenous, Q8H, Dimple Nanas, RPH, Last Rate: 12.5 mL/hr at 06/21/21 1229, 3.375 g at 06/21/21 1229   topiramate (TOPAMAX) tablet 200 mg, 200 mg, Oral, BID, Debbe Odea, MD, 200 mg at 06/21/21 1525   Exam: Current vital signs: BP 117/63 (BP Location: Right Arm)   Pulse 78   Temp 98.6 F (37 C) (Oral)   Resp 18   Ht 6' (1.829 m)   Wt 68 kg   SpO2 100%   BMI 20.34 kg/m  Vital signs in last 24 hours: Temp:  [98.2 F (36.8 C)-98.7 F (37.1 C)] 98.6 F (37 C) (11/26 0518) Pulse Rate:  [77-80] 78 (11/26 0518) Resp:  [16-20] 18 (11/26 0255) BP: (95-117)/(42-63) 117/63 (11/26 0518) SpO2:  [98 %-100 %] 100 % (11/26 0518)  GENERAL: sleeping in NAD HEENT: - Normocephalic and atraumatic, dry mm LUNGS - Clear to auscultation bilaterally with no wheezes CV - S1S2 RRR, no m/r/g, equal pulses bilaterally. ABDOMEN - Soft, nontender, nondistended with normoactive BS Ext: warm, well perfused, intact peripheral pulses,no edema Psychiatric: On attending evaluation somewhat odd affect, smiles inappropriately, reports he does not remember much leading up to the hospitalization (cannot report what he did on either Tuesday or Wednesday, just replies "I do not know" when asked when his last memory is).  NEURO:  Mental Status: AA&Ox3.   Language: speech is clear.  Delayed response. Naming, repetition, fluency, and comprehension intact. Cranial Nerves: PERRL 85mm/brisk. EOMI, visual fields full, no facial asymmetry, facial sensation intact, hearing intact, tongue/uvula/soft palate midline, normal sternocleidomastoid and trapezius muscle strength. No evidence of tongue atrophy or fibrillations Motor: 5/5 in all 4 extremities Tone: is normal and bulk is normal Sensation- Intact to light touch bilaterally Coordination: FTN intact bilaterally, no ataxia in BLE. Gait-tested by attending.  He is able to rise from the bed, walk on his heels and toes, does have a slightly wide-based shuffling gait but is steady.   Labs I have reviewed labs in epic and the results pertinent to this consultation are:   CBC  Component Value Date/Time   WBC 12.7 (H) 06/20/2021 0554   RBC 4.24 06/20/2021 0554   HGB 12.3 (L) 06/20/2021 0554   HGB 13.4 03/07/2014 2009   HCT 35.6 (L) 06/20/2021 0554   HCT 39.8 (L) 03/07/2014 2009   PLT 176 06/20/2021 0554   PLT 166 03/07/2014 2009   MCV 84.0 06/20/2021 0554   MCV 85 03/07/2014 2009   MCH 29.0 06/20/2021 0554   MCHC 34.6 06/20/2021 0554   RDW 12.2 06/20/2021 0554   RDW 13.6 03/07/2014 2009   LYMPHSABS 1.8 06/20/2021 0554   LYMPHSABS 1.3 03/07/2014 2009   MONOABS 1.1 (H) 06/20/2021 0554   MONOABS 0.4 03/07/2014 2009   EOSABS 0.0 06/20/2021 0554   EOSABS 0.2 03/07/2014 2009   BASOSABS 0.0 06/20/2021 0554   BASOSABS 0.1 03/07/2014 2009    CMP     Component Value Date/Time   NA 135 06/20/2021 0554   NA 138 03/07/2014 2009   K 3.6 06/20/2021 0554   K 3.8 03/07/2014 2009   CL 107 06/20/2021 0554   CL 106 03/07/2014 2009   CO2 21 (L) 06/20/2021 0554   CO2 26 (H) 03/07/2014 2009   GLUCOSE 98 06/20/2021 0554   GLUCOSE 88 03/07/2014 2009   BUN 16 06/20/2021 0554   BUN 11 03/07/2014 2009   CREATININE 0.93 06/20/2021 0554   CREATININE 1.08 03/12/2020 1004   CALCIUM 8.4 (L) 06/20/2021  0554   CALCIUM 9.1 03/07/2014 2009   PROT 6.4 (L) 06/20/2021 0554   PROT 6.6 03/07/2014 2009   ALBUMIN 3.9 06/20/2021 0554   ALBUMIN 3.9 03/07/2014 2009   AST 33 06/20/2021 0554   AST 26 03/07/2014 2009   ALT 28 06/20/2021 0554   ALT 27 03/07/2014 2009   ALKPHOS 62 06/20/2021 0554   ALKPHOS 295 (H) 03/07/2014 2009   BILITOT 0.7 06/20/2021 0554   BILITOT 0.4 03/07/2014 2009   GFRNONAA >60 06/20/2021 0554   GFRAA >60 04/29/2020 1011    Lipid Panel     Component Value Date/Time   CHOL 125 03/12/2020 1004   TRIG 51 03/12/2020 1004   HDL 49 03/12/2020 1004   CHOLHDL 2.6 03/12/2020 1004   LDLCALC 63 03/12/2020 1004    Imaging I have reviewed the images obtained:  CT-head No acute abnormality   Assessment:  Terrence Riley is a 20 y.o. male with past medical history of Asthma, Seizures, who presents to the ED today via EMS for complaint of possible drug overdose/AMS.  Per EMS patient's mother called out due to suspected overdose however unknown from what.  LP with RBC 31, WBC 1, protein 18 which is not consistent with infection  CSF culture, HSV, VDRL pending  UA NGTD UA, UDS negative  CT Chest Left upper lobe pneumonia EEG Intermittent slow, generalized, Excessive beta, generalized. No seizures   Acute encephalopathy due to fever and leukocytosis/PNA vs accidental overdose vs less likely seizure/post ictal (not his typical postictal presentation), possible psychiatric overlay. Given that seizures this presentation were provoked, he may be continued on home medications at home doses. Because he has a history of epilepsy and this seizure was provoked, no indication for repeat EEG or MRI brain at this time.  Fortunately his clinical course, examination, and work-up to date has been reassuring against an acute primary neurological process.  Recommendations: - Continue acyclovir until CSF HSV results negative (hospitalist may f/u) - Continue outpatient AEDs as prescribed (Keppra  1000 mg twice daily, clonazepam 1 mg twice daily, topiramate  200 mg twice daily) - Mom would prefer to keep the [place] IV for now, may be converted one-to-one as patient continues to improve - For diagnostic clarity Mom has also requested EBV testing which is reasonable, will also check RSV and CMV, results to be followed by hospitalist team - To complete reversible causes of encephalopathy work-up, B12 (and MMA) and TSH ordered, as well as CK to confirm it is downtrending  - Goal B12 > 500, please start supplementation pending MMA confirmation (high MMA would be c/w subclinical B12 deficiency) - Antibiotics per primary team - Neurology will be available as needed going forward, please reach out if additional questions or concerns arise  Beulah Gandy, NP   Attending Neurologist's note:  I personally saw this patient, gathering history, performing a full neurologic examination, reviewing relevant labs, personally reviewing relevant imaging including MRI brain, and formulated the assessment and plan, adding the note above for completeness and clarity to accurately reflect my thoughts  At this time the etiology of his symptoms is unclear but low concern for primary CNS infection given his results to date.  Nevertheless CSF can be planned in the early stages of an HSV infection and therefore do recommend continuing acyclovir until this formally results negative.  Lesleigh Noe MD-PhD [facility] [phone]

## 2021-06-22 DIAGNOSIS — J690 Pneumonitis due to inhalation of food and vomit: Secondary | ICD-10-CM | POA: Diagnosis not present

## 2021-06-22 DIAGNOSIS — R509 Fever, unspecified: Secondary | ICD-10-CM | POA: Diagnosis not present

## 2021-06-22 DIAGNOSIS — D72828 Other elevated white blood cell count: Secondary | ICD-10-CM | POA: Diagnosis not present

## 2021-06-22 LAB — CBC WITH DIFFERENTIAL/PLATELET
Abs Immature Granulocytes: 0.01 10*3/uL (ref 0.00–0.07)
Basophils Absolute: 0.1 10*3/uL (ref 0.0–0.1)
Basophils Relative: 1 %
Eosinophils Absolute: 0.1 10*3/uL (ref 0.0–0.5)
Eosinophils Relative: 2 %
HCT: 35.6 % — ABNORMAL LOW (ref 39.0–52.0)
Hemoglobin: 12.4 g/dL — ABNORMAL LOW (ref 13.0–17.0)
Immature Granulocytes: 0 %
Lymphocytes Relative: 39 %
Lymphs Abs: 1.8 10*3/uL (ref 0.7–4.0)
MCH: 28.9 pg (ref 26.0–34.0)
MCHC: 34.8 g/dL (ref 30.0–36.0)
MCV: 83 fL (ref 80.0–100.0)
Monocytes Absolute: 0.5 10*3/uL (ref 0.1–1.0)
Monocytes Relative: 11 %
Neutro Abs: 2.1 10*3/uL (ref 1.7–7.7)
Neutrophils Relative %: 47 %
Platelets: 166 10*3/uL (ref 150–400)
RBC: 4.29 MIL/uL (ref 4.22–5.81)
RDW: 12 % (ref 11.5–15.5)
WBC: 4.6 10*3/uL (ref 4.0–10.5)
nRBC: 0 % (ref 0.0–0.2)

## 2021-06-22 LAB — URINALYSIS, ROUTINE W REFLEX MICROSCOPIC
Bilirubin Urine: NEGATIVE
Glucose, UA: NEGATIVE mg/dL
Hgb urine dipstick: NEGATIVE
Ketones, ur: NEGATIVE mg/dL
Leukocytes,Ua: NEGATIVE
Nitrite: NEGATIVE
Protein, ur: NEGATIVE mg/dL
Specific Gravity, Urine: 1.008 (ref 1.005–1.030)
pH: 8 (ref 5.0–8.0)

## 2021-06-22 LAB — BASIC METABOLIC PANEL
Anion gap: 6 (ref 5–15)
BUN: 10 mg/dL (ref 6–20)
CO2: 23 mmol/L (ref 22–32)
Calcium: 8.8 mg/dL — ABNORMAL LOW (ref 8.9–10.3)
Chloride: 110 mmol/L (ref 98–111)
Creatinine, Ser: 1.01 mg/dL (ref 0.61–1.24)
GFR, Estimated: >60 mL/min (ref >60)
Glucose, Bld: 94 mg/dL (ref 70–99)
Potassium: 3.3 mmol/L — ABNORMAL LOW (ref 3.5–5.1)
Sodium: 139 mmol/L (ref 135–145)

## 2021-06-22 LAB — VITAMIN B12
Vitamin B-12: 127 pg/mL — ABNORMAL LOW (ref 180–914)
Vitamin B-12: 115 pg/mL — ABNORMAL LOW (ref 180–914)

## 2021-06-22 LAB — TSH: TSH: 1.729 u[IU]/mL (ref 0.350–4.500)

## 2021-06-22 LAB — CK: Total CK: 929 U/L — ABNORMAL HIGH (ref 49–397)

## 2021-06-22 LAB — GLUCOSE, CAPILLARY: Glucose-Capillary: 109 mg/dL — ABNORMAL HIGH (ref 70–99)

## 2021-06-22 LAB — MAGNESIUM: Magnesium: 1.8 mg/dL (ref 1.7–2.4)

## 2021-06-22 MED ORDER — CYANOCOBALAMIN 1000 MCG/ML IJ SOLN
1000.0000 ug | Freq: Once | INTRAMUSCULAR | Status: DC
  Filled 2021-06-22: qty 1

## 2021-06-22 MED ORDER — POTASSIUM CHLORIDE 10 MEQ/100ML IV SOLN
10.0000 meq | INTRAVENOUS | Status: AC
  Administered 2021-06-22 (×4): 10 meq via INTRAVENOUS
  Filled 2021-06-22 (×5): qty 100

## 2021-06-22 MED ORDER — POTASSIUM CHLORIDE CRYS ER 20 MEQ PO TBCR
40.0000 meq | EXTENDED_RELEASE_TABLET | ORAL | Status: AC
  Administered 2021-06-22 (×2): 40 meq via ORAL
  Filled 2021-06-22 (×2): qty 2

## 2021-06-22 MED ORDER — CYANOCOBALAMIN 1000 MCG/ML IJ SOLN
1000.0000 ug | Freq: Every day | INTRAMUSCULAR | Status: AC
  Administered 2021-06-23 – 2021-06-27 (×5): 1000 ug via SUBCUTANEOUS
  Filled 2021-06-22 (×5): qty 1

## 2021-06-22 NOTE — Progress Notes (Signed)
PROGRESS NOTE    Terrence Riley   HCW:[id number]  DOB: 2001/07/20  DOA: 06/20/2021 PCP: Eulas Post, MD   Brief Narrative:  Terrence Riley is a 20 year old male with history of seizure disorder who presents to the hospital for confusion.  The history was obtained from the patient's mother who stated that when she went to check on him in his bedroom, she saw that there was urine all over the floor of the spectrum.  She found him downstairs confused and without any clothes on.  He mentioned to her that he believed he may be taking too many medications but did not elaborate.  She did not see him having seizures yesterday however he did have swelling of his right lower lip which appeared to be from an injury.  In the ED the patient was noted to have a temperature of 101.  He admitted to having a cough and a sore throat.  Due to confusion, an LP was performed.  Blood culture chest x-ray and UA also performed. CT scan of the chest with contrast was performed and this revealed a left upper lobe infiltrate. Influenza and COVID test found to be negative  Subjective: He is still very sleepy today. Barely eating and drinking.     Assessment & Plan:   Principal Problem:   Acute encephalopathy-fever, leukocytosis - ?  Post ictal versus due to infection versus accidental overdose of medications - Urine drug screen was negative - Serum alcohol level and salicylate level were negligible - Head CT was unrevealing -WBC count was 14.9 - Lactic acid was 2.3 and improved to 1.3 - CSF was not consistent with a bacterial infection-31 RBCs were noted, protein level was normal and there was 1 WBC-have discontinued ceftriaxone-neurology recommends we continue acyclovir until HSV PCR returns  Active Problems:   Seizure disorder ([place]) -Difficult to tell if the patient was postictal as he was not witnessed to have a seizure - Due to his mental status and inability to take oral medications, Lamictal  was held and the patient was placed on Vimpat after being given a Vimpat bolus - as he is alert today, will resume Lamictal - Keppra was transitioned to IV at his home dose of 2 g twice daily - 11/25> EEG was performed and it reveals mild diffuse encephalopathy, excessive beta activity seen on the background is most likely due to the effect of benzodiazepine and is a benign EEG pattern-no seizure or epileptiform discharges were seen throughout the recording  Elevated CPK - CPK noted to be 984- ? If secondary to unwitnessed seizure - Rechecked and found to be 929  Hypokalemia - replace  Low Vit B12 - 127 - rechecked and found to be 115 - start replacement    Aspiration pneumonia ? -Left upper lobe infiltrate noted on the CT scan - according to chart the patient had a cough and a sore throat - cont Zosyn to cover for possible aspiration ([place])   Time spent in minutes: 35 DVT prophylaxis: SCDs Start: 06/20/21 0432  Code Status: Full code Family Communication:  Level of Care: Level of care: Progressive Disposition Plan:  Status is: Inpatient  Remains inpatient appropriate because: on IV antibiotics- ongoing confusion  Consultants:  neuro Procedures:  EEG Antimicrobials:  Anti-infectives (From admission, onward)    Start     Dose/Rate Route Frequency Ordered Stop   06/20/21 1700  vancomycin (VANCOREADY) IVPB 1250 mg/250 mL  Status:  Discontinued  1,250 mg 166.7 mL/hr over 90 Minutes Intravenous Every 12 hours 06/20/21 0507 06/20/21 0850   06/20/21 1600  cefTRIAXone (ROCEPHIN) 2 g in sodium chloride 0.9 % 100 mL IVPB  Status:  Discontinued        2 g 200 mL/hr over 30 Minutes Intravenous Every 12 hours 06/20/21 0440 06/20/21 0853   06/20/21 0900  piperacillin-tazobactam (ZOSYN) IVPB 3.375 g        3.375 g 12.5 mL/hr over 240 Minutes Intravenous Every 8 hours 06/20/21 0856     06/20/21 0545  vancomycin (VANCOREADY) IVPB 500 mg/100 mL        500 mg 100 mL/hr over 60  Minutes Intravenous  Once 06/20/21 0444 06/20/21 0558   06/20/21 0500  acyclovir (ZOVIRAX) 680 mg in dextrose 5 % 100 mL IVPB        10 mg/kg  68 kg 113.6 mL/hr over 60 Minutes Intravenous Every 8 hours 06/20/21 0442     06/20/21 0300  cefTRIAXone (ROCEPHIN) 2 g in sodium chloride 0.9 % 100 mL IVPB        2 g 200 mL/hr over 30 Minutes Intravenous  Once 06/20/21 0245 06/20/21 0439   06/20/21 0300  vancomycin (VANCOCIN) IVPB 1000 mg/200 mL premix        1,000 mg 200 mL/hr over 60 Minutes Intravenous  Once 06/20/21 0245 06/20/21 0528        Objective: Vitals:   06/21/21 0518 06/21/21 2215 06/22/21 0421 06/22/21 1321  BP: 117/63 121/67 119/74 (!) 105/59  Pulse: 78 82 66 60  Resp:  20 20 20   Temp: 98.6 F (37 C) 99 F (37.2 C) 98.5 F (36.9 C) 98 F (36.7 C)  TempSrc: Oral  Oral Oral  SpO2: 100% 99% 100% 99%  Weight:      Height:        Intake/Output Summary (Last 24 hours) at 06/22/2021 1509 Last data filed at 06/22/2021 1428 Gross per 24 hour  Intake 3107.19 ml  Output 2400 ml  Net 707.19 ml    Filed Weights   06/20/21 0007  Weight: 68 kg    Examination: General exam: Appears comfortable  HEENT: PERRLA, oral mucosa moist, no sclera icterus or thrush Respiratory system: Clear to auscultation. Respiratory effort normal. Cardiovascular system: S1 & S2 heard, regular rate and rhythm Gastrointestinal system: Abdomen soft, non-tender, nondistended. Normal bowel sounds   Central nervous system: Alert and oriented. No focal neurological deficits. Extremities: No cyanosis, clubbing or edema Skin: No rashes or ulcers Psychiatry:  Mood & affect appropriate.      Data Reviewed: I have personally reviewed following labs and imaging studies  CBC: Recent Labs  Lab 06/20/21 0042 06/20/21 0554 06/22/21 0400  WBC 14.9* 12.7* 4.6  NEUTROABS 13.6* 9.8* 2.1  HGB 15.4 12.3* 12.4*  HCT 44.5 35.6* 35.6*  MCV 83.8 84.0 83.0  PLT 210 176 696    Basic Metabolic  Panel: Recent Labs  Lab 06/20/21 0042 06/20/21 0554 06/22/21 0400  NA 138 135 139  K 4.5 3.6 3.3*  CL 105 107 110  CO2 23 21* 23  GLUCOSE 91 98 94  BUN 17 16 10   CREATININE 1.14 0.93 1.01  CALCIUM 9.5 8.4* 8.8*    GFR: Estimated Creatinine Clearance: 112.2 mL/min (by C-G formula based on SCr of 1.01 mg/dL). Liver Function Tests: Recent Labs  Lab 06/20/21 0042 06/20/21 0554  AST 51* 33  ALT 43 28  ALKPHOS 80 62  BILITOT 1.1 0.7  PROT 7.9 6.4*  ALBUMIN 4.8 3.9    No results for input(s): LIPASE, AMYLASE in the last 168 hours. No results for input(s): AMMONIA in the last 168 hours. Coagulation Profile: No results for input(s): INR, PROTIME in the last 168 hours. Cardiac Enzymes: Recent Labs  Lab 06/20/21 0042 06/22/21 0400  CKTOTAL 984* 929*    BNP (last 3 results) No results for input(s): PROBNP in the last 8760 hours. HbA1C: No results for input(s): HGBA1C in the last 72 hours. CBG: Recent Labs  Lab 06/20/21 1141 06/20/21 2327 06/21/21 0645 06/21/21 1129 06/21/21 1716  GLUCAP 83 75 74 85 142*    Lipid Profile: No results for input(s): CHOL, HDL, LDLCALC, TRIG, CHOLHDL, LDLDIRECT in the last 72 hours. Thyroid Function Tests: Recent Labs    06/22/21 0400  TSH 1.729   Anemia Panel: Recent Labs    06/22/21 0400 06/22/21 0907  VITAMINB12 127* 115*   Urine analysis:    Component Value Date/Time   COLORURINE YELLOW 06/20/2021 0041   APPEARANCEUR HAZY (A) 06/20/2021 0041   APPEARANCEUR Clear 08/23/2013 1127   LABSPEC 1.013 06/20/2021 0041   LABSPEC 1.017 08/23/2013 1127   PHURINE 5.0 06/20/2021 0041   GLUCOSEU NEGATIVE 06/20/2021 0041   GLUCOSEU Negative 08/23/2013 1127   HGBUR MODERATE (A) 06/20/2021 0041   BILIRUBINUR NEGATIVE 06/20/2021 0041   BILIRUBINUR Negative 08/23/2013 1127   KETONESUR 20 (A) 06/20/2021 0041   PROTEINUR 30 (A) 06/20/2021 0041   NITRITE NEGATIVE 06/20/2021 0041   LEUKOCYTESUR NEGATIVE 06/20/2021 0041    LEUKOCYTESUR Negative 08/23/2013 1127   Sepsis Labs: @LABRCNTIP (procalcitonin:4,lacticidven:4) ) Recent Results (from the past 240 hour(s))  Resp Panel by RT-PCR (Flu A&B, Covid) Nasopharyngeal Swab     Status: None   Collection Time: 06/20/21  1:19 AM   Specimen: Nasopharyngeal Swab; Nasopharyngeal(NP) swabs in vial transport medium  Result Value Ref Range Status   SARS Coronavirus 2 by RT PCR NEGATIVE NEGATIVE Final    Comment: (NOTE) SARS-CoV-2 target nucleic acids are NOT DETECTED.  The SARS-CoV-2 RNA is generally detectable in upper respiratory specimens during the acute phase of infection. The lowest concentration of SARS-CoV-2 viral copies this assay can detect is 138 copies/mL. A negative result does not preclude SARS-Cov-2 infection and should not be used as the sole basis for treatment or other patient management decisions. A negative result may occur with  improper specimen collection/handling, submission of specimen other than nasopharyngeal swab, presence of viral mutation(s) within the areas targeted by this assay, and inadequate number of viral copies(<138 copies/mL). A negative result must be combined with clinical observations, patient history, and epidemiological information. The expected result is Negative.  Fact Sheet for Patients:  EntrepreneurPulse.com.au  Fact Sheet for Healthcare Providers:  IncredibleEmployment.be  This test is no t yet approved or cleared by the [place] FDA and  has been authorized for detection and/or diagnosis of SARS-CoV-2 by FDA under an Emergency Use Authorization (EUA). This EUA will remain  in effect (meaning this test can be used) for the duration of the COVID-19 declaration under Section 564(b)(1) of the Act, 21 U.S.C.section 360bbb-3(b)(1), unless the authorization is terminated  or revoked sooner.       Influenza A by PCR NEGATIVE NEGATIVE Final   Influenza B by PCR NEGATIVE  NEGATIVE Final    Comment: (NOTE) The Xpert Xpress SARS-CoV-2/FLU/RSV plus assay is intended as an aid in the diagnosis of influenza from Nasopharyngeal swab specimens and should not be used as a sole basis for treatment. Nasal washings and  aspirates are unacceptable for Xpert Xpress SARS-CoV-2/FLU/RSV testing.  Fact Sheet for Patients: EntrepreneurPulse.com.au  Fact Sheet for Healthcare Providers: IncredibleEmployment.be  This test is not yet approved or cleared by the [place] FDA and has been authorized for detection and/or diagnosis of SARS-CoV-2 by FDA under an Emergency Use Authorization (EUA). This EUA will remain in effect (meaning this test can be used) for the duration of the COVID-19 declaration under Section 564(b)(1) of the Act, 21 U.S.C. section 360bbb-3(b)(1), unless the authorization is terminated or revoked.  Performed at [facility], [place] [address], [place], [place] [address]   Urine Culture     Status: None   Collection Time: 06/20/21  2:55 AM   Specimen: Urine, Clean Catch  Result Value Ref Range Status   Specimen Description   Final    URINE, CLEAN CATCH Performed at [facility], [place] [address], [place], [place] [address]    Special Requests   Final    NONE Performed at [facility], [place] [address] [place], [place] [address]    Culture   Final    NO GROWTH Performed at [facility], [place] [address], [place], [place] [address]    Report Status 06/21/2021 FINAL  Final  CSF culture     Status: None (Preliminary result)   Collection Time: 06/20/21  3:39 AM   Specimen: Back; Cerebrospinal Fluid  Result Value Ref Range Status   Specimen Description   Final    BACK Performed at [place] [address], [place], [place] [address]    Special Requests   Final    NONE Performed at [facility], [place] [address], [place], [place] [address]    Gram Stain   Final    NO ORGANISMS SEEN NO WBC SEEN Gram Stain Report Called to,Read Back By and Verified With: RN J PRAY AT [phone] 06/20/21 CRUICKSHANK A Performed at [facility], [place] [address], [place], [place] [address]    Culture   Final    NO GROWTH 2 DAYS Performed at [place] [address], [place], [place] [address]    Report Status PENDING  Incomplete  Gram stain     Status: None   Collection Time: 06/20/21  3:39 AM   Specimen: CSF; Cerebrospinal Fluid  Result Value Ref Range Status   Specimen Description CSF LP  Final   Special Requests NONE  Final   Gram Stain   Final    NO ORGANISMS SEEN NO WBC SEEN Gram Stain Report Called to,Read Back By and Verified With: rn j pray at 0442 06/20/21 cruickshank a Performed at [facility], [place] [address] [place], [place] [address]    Report Status 06/20/2021 FINAL  Final  Culture, blood (routine x 2)     Status: None (Preliminary result)   Collection Time: 06/20/21  4:01 AM   Specimen: Left Antecubital; Blood  Result Value Ref Range Status   Specimen Description   Final    LEFT ANTECUBITAL Performed at [place] [address], [place], [place] [address]    Special Requests   Final    BOTTLES DRAWN AEROBIC AND ANAEROBIC Blood Culture adequate volume Performed at [place] [address], [place],  [address]    Culture   Final    NO GROWTH 2 DAYS Performed at [place] [address] [place], [place]  [address]    Report Status PENDING  Incomplete  Culture, blood (routine x 2)     Status: None (Preliminary result)   Collection Time: 06/20/21  4:01 AM   Specimen: BLOOD LEFT FOREARM  Result Value Ref Range Status   Specimen Description   Final    BLOOD LEFT FOREARM Performed at [place] [address] [place], [place] [address]    Special Requests   Final     BOTTLES DRAWN AEROBIC AND ANAEROBIC Blood Culture results may not be optimal due to an inadequate volume of blood received in culture bottles Performed at [place] [address], [place], [place] [address]    Culture   Final    NO GROWTH 2 DAYS Performed at [place] [address], [place], [place] [address]    Report Status PENDING  Incomplete  Respiratory (~20 pathogens) panel by PCR     Status: None   Collection Time: 06/20/21  5:37 AM   Specimen: Nasopharyngeal Swab; Respiratory  Result Value Ref Range Status   Adenovirus NOT DETECTED NOT DETECTED Final   Coronavirus 229E NOT DETECTED NOT DETECTED Final    Comment: (NOTE) The Coronavirus on the Respiratory Panel, DOES NOT test for the novel  Coronavirus (2019 nCoV)    Coronavirus HKU1 NOT DETECTED NOT DETECTED Final   Coronavirus NL63 NOT DETECTED NOT DETECTED Final   Coronavirus OC43 NOT DETECTED NOT DETECTED Final   Metapneumovirus NOT DETECTED NOT DETECTED Final   Rhinovirus / Enterovirus NOT DETECTED NOT DETECTED Final   Influenza A NOT DETECTED NOT DETECTED Final   Influenza B NOT DETECTED NOT DETECTED Final   Parainfluenza Virus 1 NOT DETECTED NOT DETECTED Final   Parainfluenza Virus 2 NOT DETECTED NOT DETECTED Final   Parainfluenza Virus 3 NOT DETECTED NOT DETECTED Final   Parainfluenza Virus 4 NOT DETECTED NOT DETECTED Final   Respiratory Syncytial Virus NOT DETECTED NOT DETECTED Final   Bordetella pertussis NOT DETECTED NOT DETECTED Final   Bordetella Parapertussis NOT DETECTED NOT DETECTED Final   Chlamydophila pneumoniae NOT DETECTED NOT DETECTED Final   Mycoplasma pneumoniae NOT DETECTED NOT DETECTED Final    Comment: Performed at [facility], [place]. [address], [place], [place] [address]         Radiology Studies: No results found.    Scheduled Meds:  clonazePAM  1 mg Oral BID   cyanocobalamin  1,000 mcg Subcutaneous Once   KEPPRA (BRAND NAME ONLY) 2000 MG =  2 TABS  2,000 mg Oral BID   potassium chloride  40 mEq Oral Q4H   TOPAMAX (BRAND NAME ONLY) 200 MG = 1 TAB  200 mg Oral BID   Continuous Infusions:  sodium chloride 1,000 mL (06/22/21 1040)   acyclovir (ZOVIRAX) </= 700 mg IVPB 680 mg (06/22/21 0945)   piperacillin-tazobactam (ZOSYN)  IV 3.375 g (06/22/21 1310)   potassium chloride       LOS: 2 days      Debbe Odea, MD [facility] Pager: [email or web address] 06/22/2021, 3:09 PM

## 2021-06-23 ENCOUNTER — Other Ambulatory Visit: Payer: Self-pay

## 2021-06-23 ENCOUNTER — Inpatient Hospital Stay (HOSPITAL_COMMUNITY): Payer: No Typology Code available for payment source

## 2021-06-23 DIAGNOSIS — R4182 Altered mental status, unspecified: Secondary | ICD-10-CM

## 2021-06-23 DIAGNOSIS — J690 Pneumonitis due to inhalation of food and vomit: Secondary | ICD-10-CM | POA: Diagnosis not present

## 2021-06-23 DIAGNOSIS — R509 Fever, unspecified: Secondary | ICD-10-CM | POA: Diagnosis not present

## 2021-06-23 DIAGNOSIS — D72828 Other elevated white blood cell count: Secondary | ICD-10-CM | POA: Diagnosis not present

## 2021-06-23 LAB — BASIC METABOLIC PANEL
Anion gap: 6 (ref 5–15)
BUN: <5 mg/dL — ABNORMAL LOW (ref 6–20)
CO2: 19 mmol/L — ABNORMAL LOW (ref 22–32)
Calcium: 8.9 mg/dL (ref 8.9–10.3)
Chloride: 113 mmol/L — ABNORMAL HIGH (ref 98–111)
Creatinine, Ser: 0.81 mg/dL (ref 0.61–1.24)
GFR, Estimated: >60 mL/min (ref >60)
Glucose, Bld: 90 mg/dL (ref 70–99)
Potassium: 3.2 mmol/L — ABNORMAL LOW (ref 3.5–5.1)
Sodium: 138 mmol/L (ref 135–145)

## 2021-06-23 LAB — HSV 1/2 PCR, CSF
HSV-1 DNA: NEGATIVE
HSV-2 DNA: NEGATIVE

## 2021-06-23 LAB — CSF CULTURE W GRAM STAIN
Culture: NO GROWTH
Gram Stain: NONE SEEN

## 2021-06-23 LAB — INTRINSIC FACTOR ANTIBODIES: Intrinsic Factor: 1 AU/mL (ref 0.0–1.1)

## 2021-06-23 LAB — CK: Total CK: 1398 U/L — ABNORMAL HIGH (ref 49–397)

## 2021-06-23 LAB — METHYLMALONIC ACID, SERUM: Methylmalonic Acid, Quantitative: 180 nmol/L (ref 0–378)

## 2021-06-23 LAB — CMV IGM: CMV IgM: <30 AU/mL (ref 0.0–29.9)

## 2021-06-23 LAB — LEVETIRACETAM LEVEL: Levetiracetam Lvl: <2 ug/mL — ABNORMAL LOW (ref 10.0–40.0)

## 2021-06-23 LAB — EPSTEIN-BARR VIRUS VCA, IGG: EBV VCA IgG: <18 U/mL (ref 0.0–17.9)

## 2021-06-23 LAB — EPSTEIN-BARR VIRUS VCA, IGM: EBV VCA IgM: <36 U/mL (ref 0.0–35.9)

## 2021-06-23 LAB — ANA W/REFLEX IF POSITIVE: Anti Nuclear Antibody (ANA): NEGATIVE

## 2021-06-23 LAB — TOPIRAMATE LEVEL: Topiramate Lvl: <1.5 ug/mL — ABNORMAL LOW (ref 2.0–25.0)

## 2021-06-23 IMAGING — MR MR HEAD WO/W CM
12 series · 48 of 48 positions shown · IV contrast (GADAVIST)
Comparison: None.

CLINICAL DATA: Delirium

EXAM:
MRI HEAD WITHOUT AND WITH CONTRAST
TECHNIQUE: Multiplanar, multiecho pulse sequences of the brain and surrounding
structures were obtained without and with intravenous contrast.
CONTRAST:  7.5mL GADAVIST GADOBUTROL 1 MMOL/ML IV SOLN

[Series 5: DWI · axial · 3.0mm · 1.36mm/px · z∈[-43,+97]mm · 5 of 96 slices shown (1 of 2)]
[im 1/96]
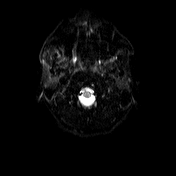
[im 24/96]
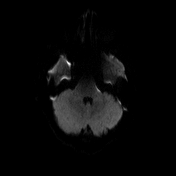
[im 48/96]
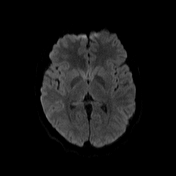
[im 72/96]
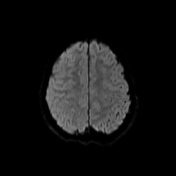
[im 96/96]
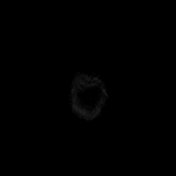

[Series 6: DWI · axial · 3.0mm · 1.36mm/px · z∈[-43,+97]mm · 3 of 48 slices shown (2 of 2)]
[im 1/48]
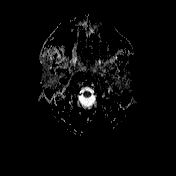
[im 24/48]
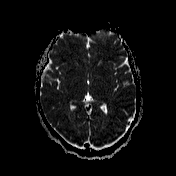
[im 48/48]
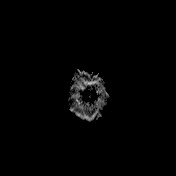

[Series 7: T1 · sagittal · 5.0mm · 0.75mm/px · 2 of 24 slices shown (1 of 4)]
[im 1/24]
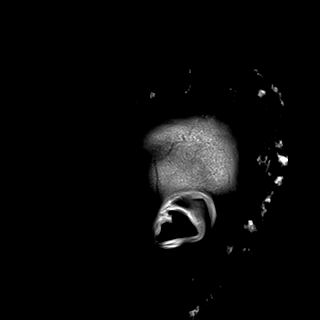
[im 24/24]
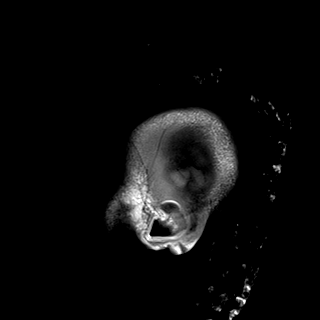

[Series 8: T2 · axial · 5.0mm · 0.62mm/px · z∈[-53,+108]mm · 2 of 26 slices shown]
[im 1/26]
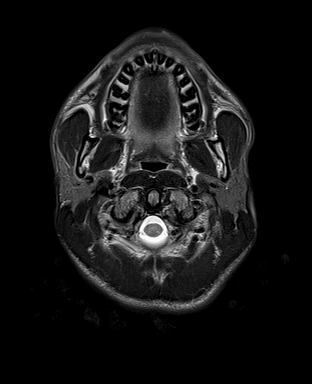
[im 26/26]
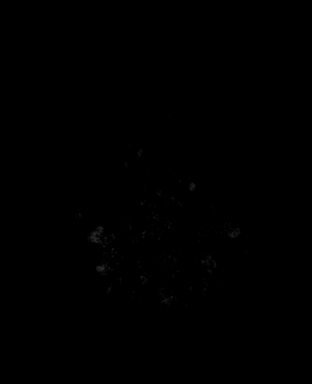

[Series 9: swi_images · axial · 3.0mm · 0.75mm/px · z∈[-55,+109]mm · 4 of 56 slices shown]
[im 1/56]
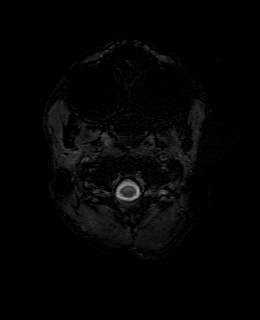
[im 19/56]
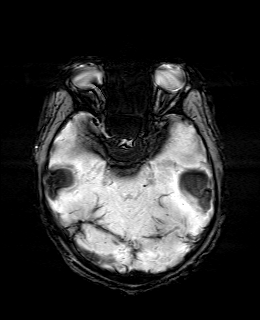
[im 37/56]
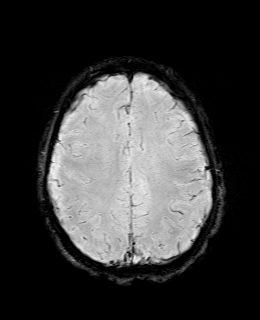
[im 56/56]
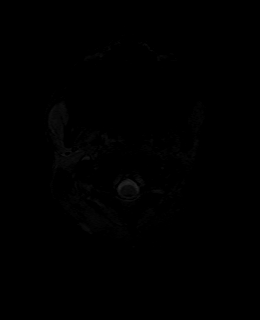

[Series 11: FLAIR · axial · 3.0mm · 0.75mm/px · z∈[-49,+103]mm · 3 of 52 slices shown]
[im 1/52]
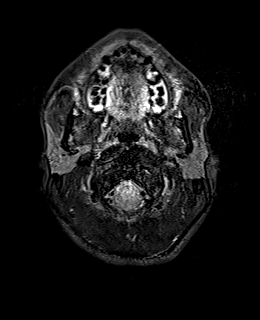
[im 26/52]
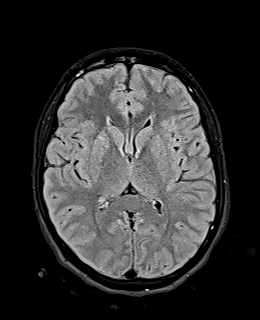
[im 52/52]
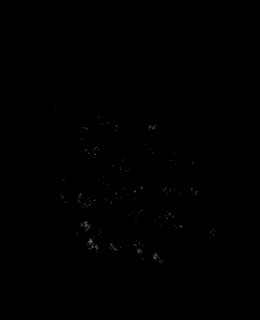

[Series 12: T1 · axial · 1.0mm · 0.94mm/px · z∈[-49,+109]mm · 10 of 160 slices shown (2 of 4)]
[im 1/160]
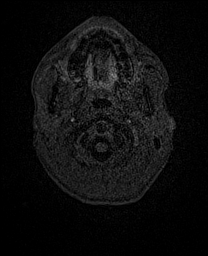
[im 18/160]
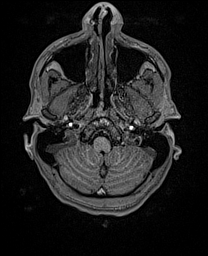
[im 36/160]
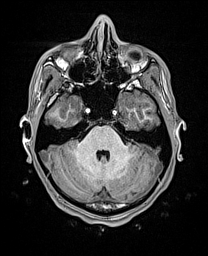
[im 54/160]
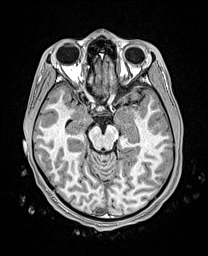
[im 71/160]
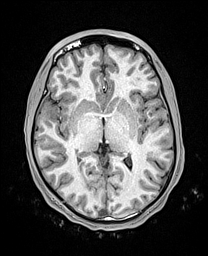
[im 89/160]
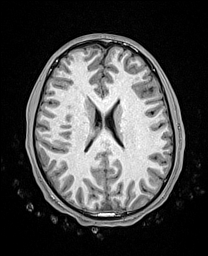
[im 107/160]
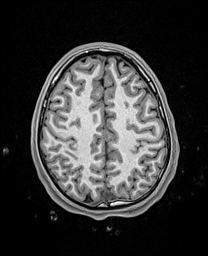
[im 124/160]
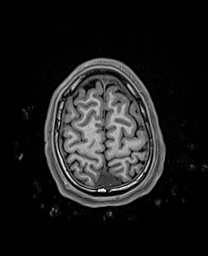
[im 142/160]
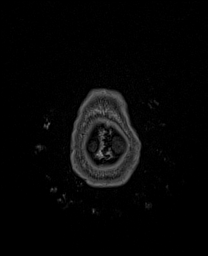
[im 160/160]
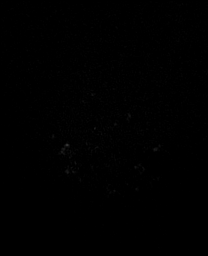

[Series 15: T2 post-contrast · coronal · 5.0mm · 0.57mm/px · 2 of 28 slices shown]
[im 1/28]
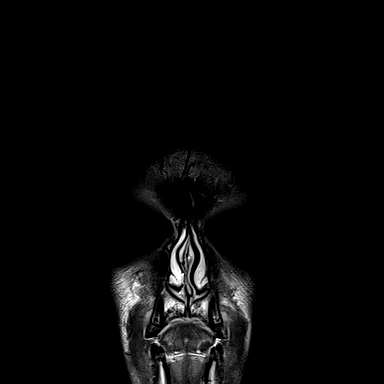
[im 28/28]
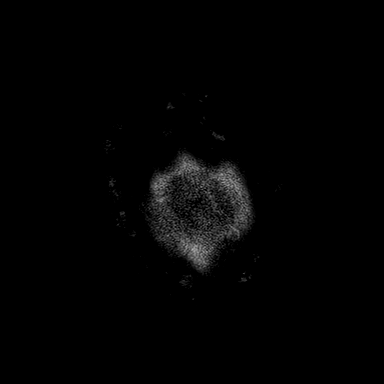

[Series 16: T1 post-contrast · axial · 1.0mm · 0.94mm/px · z∈[-49,+109]mm · 10 of 160 slices shown (1 of 2)]
[im 1/160]
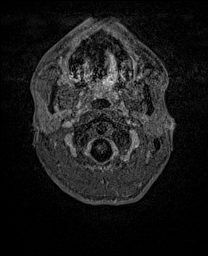
[im 18/160]
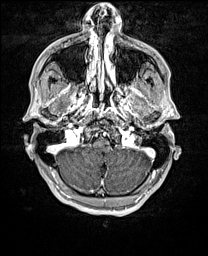
[im 36/160]
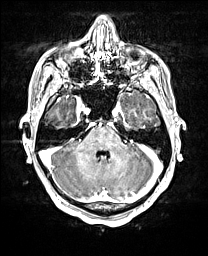
[im 54/160]
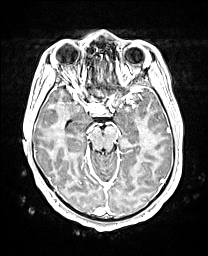
[im 71/160]
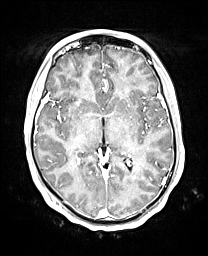
[im 89/160]
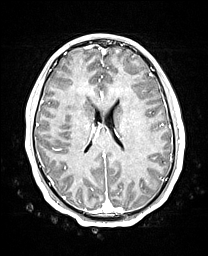
[im 107/160]
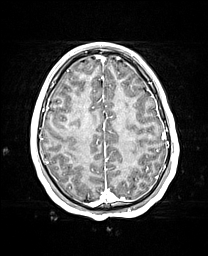
[im 124/160]
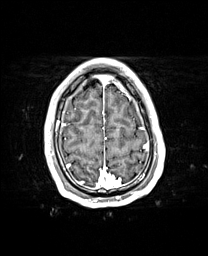
[im 142/160]
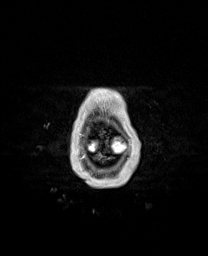
[im 160/160]
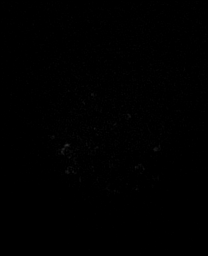

[Series 17: T1 · sagittal · 4.0mm · 0.94mm/px · 2 of 34 slices shown (3 of 4)]
[im 1/34]
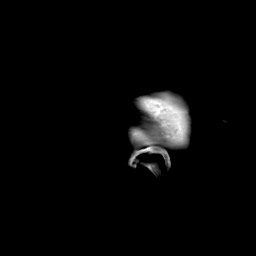
[im 34/34]
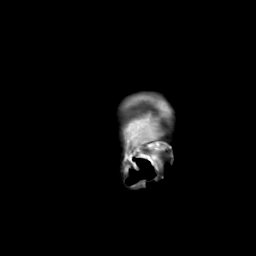

[Series 18: T1 · coronal · 4.0mm · 0.94mm/px · 3 of 43 slices shown (4 of 4)]
[im 1/43]
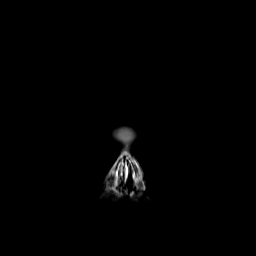
[im 22/43]
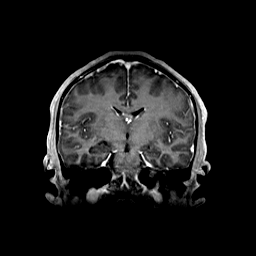
[im 43/43]
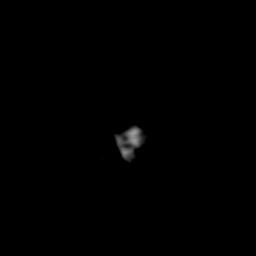

[Series 19: T1 post-contrast · coronal · 5.0mm · 0.43mm/px · 2 of 28 slices shown (2 of 2)]
[im 1/28]
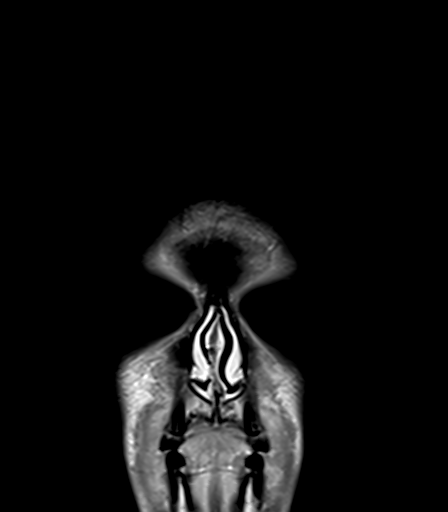
[im 28/28]
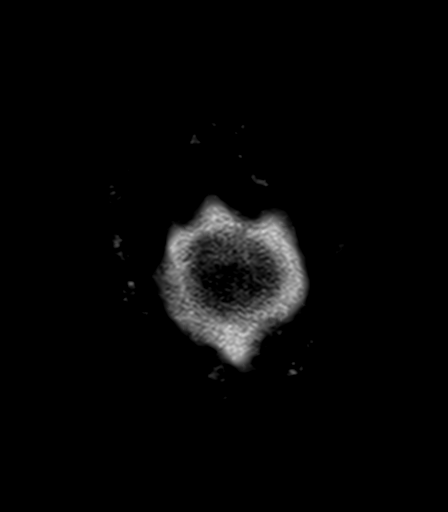

[48 of 48 positions shown; findings below may reference images not displayed]

FINDINGS: Brain: No acute infarct, mass effect or extra-axial collection. No
acute or chronic hemorrhage. Normal white matter signal, parenchymal
volume and CSF spaces. The midline structures are normal. There is
no abnormal contrast enhancement.

Vascular: Major flow voids are preserved.

Skull and upper cervical spine: Normal calvarium and skull base.
Visualized upper cervical spine and soft tissues are normal.

Sinuses/Orbits:No paranasal sinus fluid levels or advanced mucosal
thickening. No mastoid or middle ear effusion. Normal orbits.
IMPRESSION: Normal brain MRI.

## 2021-06-23 MED ORDER — POTASSIUM CHLORIDE CRYS ER 20 MEQ PO TBCR
40.0000 meq | EXTENDED_RELEASE_TABLET | ORAL | Status: AC
  Administered 2021-06-23 (×2): 40 meq via ORAL
  Filled 2021-06-23 (×2): qty 2

## 2021-06-23 MED ORDER — GADOBUTROL 1 MMOL/ML IV SOLN
7.5000 mL | Freq: Once | INTRAVENOUS | Status: AC | PRN
  Administered 2021-06-23: 20:00:00 7.5 mL via INTRAVENOUS

## 2021-06-23 MED ORDER — POTASSIUM CHLORIDE IN NACL 20-0.9 MEQ/L-% IV SOLN
INTRAVENOUS | Status: DC
  Filled 2021-06-23 (×7): qty 1000

## 2021-06-23 NOTE — Progress Notes (Addendum)
PROGRESS NOTE    Terrence Riley   MAY:[id number]  DOB: 2001-01-17  DOA: 06/20/2021 PCP: Eulas Post, MD   Brief Narrative:  Terrence Riley is a 20 year old male with history of seizure disorder, eosinophilic esophagitis (as a child) who presents to the hospital for confusion.  The history was obtained from the patient's mother who stated that when she went to check on him in his bedroom, she saw that there was urine all over the floor of the spectrum.  She found him downstairs confused and without any clothes on.  He mentioned to her that he believed he may be taking too many medications but did not elaborate.  She did not see him having seizures yesterday however he did have swelling of his right lower lip which appeared to be from an injury.  In the ED the patient was noted to have a temperature of 101.  He admitted to having a cough and a sore throat.  Due to confusion, an LP was performed.  Blood culture chest x-ray and UA also performed. CT scan of the chest with contrast was performed and this revealed a left upper lobe infiltrate. Influenza and COVID test found to be negative  Subjective: He continues to be somnolent and slow to respond to questions.    Assessment & Plan:   Principal Problem:   Acute encephalopathy-fever, leukocytosis - ?  Post ictal versus due to infection versus accidental overdose of medications - Urine drug screen was negative - Serum alcohol level and salicylate level were negligible - Head CT was unrevealing -WBC count was 14.9 - Lactic acid was 2.3 and improved to 1.3 - CSF was not consistent with a bacterial infection-31 RBCs were noted, protein level was normal and there was 1 WBC-have discontinued ceftriaxone - neurology recommends we continue acyclovir until HSV PCR returns - unable to find any other cause for his ongoing encephalopathy- he continues to sleep most of the day and is slow to respond to questions - f/u MRI - have spoken  with Neuro and asked if we can do a 24 hr EEG on him- Dr Leonel Ramsay is in agreement- he will need to be transferred to [facility] for this- I have made the transfer request  Active Problems:   Seizure disorder ([place]) -Difficult to tell if the patient was postictal as he was not witnessed to have a seizure - Due to his mental status and inability to take oral medications, Lamictal was held and the patient was placed on Vimpat after being given a Vimpat bolus - as he is alert today, will resume Lamictal - Keppra was transitioned to IV at his home dose of 2 g twice daily - 11/25> EEG was performed and it reveals mild diffuse encephalopathy, excessive beta activity seen on the background is most likely due to the effect of benzodiazepine and is a benign EEG pattern-no seizure or epileptiform discharges were seen throughout the recording - he was transitioned back to oral Keppra and Lamictal - due to poor oral intake (related to somnolence) he remains on continuous IVF  Elevated CPK - CPK noted to be 984 > 929 and then 1,398  ? If secondary to unwitnessed seizure  Hypokalemia - replace  Low Vit B12 - 127 - rechecked and found to be 115 - started replacement with s/c B12 on 11/27 - Intrinsic factor antibiotic level is normal    Aspiration pneumonia ? -Left upper lobe infiltrate noted on the CT scan - according to  chart the patient had a cough and a sore throat - cont Zosyn for a total of 5 days to cover for possible aspiration [facility])- stop date 11/29   Time spent in minutes: 35 DVT prophylaxis: SCDs Start: 06/20/21 0432  Code Status: Full code Family Communication:  Level of Care: Level of care: Progressive Disposition Plan:  Status is: Inpatient  Remains inpatient appropriate because: on IV antibiotics- ongoing confusion  Consultants:  neuro Procedures:  EEG Antimicrobials:  Anti-infectives (From admission, onward)    Start     Dose/Rate Route Frequency Ordered Stop    06/20/21 1700  vancomycin (VANCOREADY) IVPB 1250 mg/250 mL  Status:  Discontinued        1,250 mg 166.7 mL/hr over 90 Minutes Intravenous Every 12 hours 06/20/21 0507 06/20/21 0850   06/20/21 1600  cefTRIAXone (ROCEPHIN) 2 g in sodium chloride 0.9 % 100 mL IVPB  Status:  Discontinued        2 g 200 mL/hr over 30 Minutes Intravenous Every 12 hours 06/20/21 0440 06/20/21 0853   06/20/21 0900  piperacillin-tazobactam (ZOSYN) IVPB 3.375 g        3.375 g 12.5 mL/hr over 240 Minutes Intravenous Every 8 hours 06/20/21 0856     06/20/21 0545  vancomycin (VANCOREADY) IVPB 500 mg/100 mL        500 mg 100 mL/hr over 60 Minutes Intravenous  Once 06/20/21 0444 06/20/21 0558   06/20/21 0500  acyclovir (ZOVIRAX) 680 mg in dextrose 5 % 100 mL IVPB        10 mg/kg  68 kg 113.6 mL/hr over 60 Minutes Intravenous Every 8 hours 06/20/21 0442     06/20/21 0300  cefTRIAXone (ROCEPHIN) 2 g in sodium chloride 0.9 % 100 mL IVPB        2 g 200 mL/hr over 30 Minutes Intravenous  Once 06/20/21 0245 06/20/21 0439   06/20/21 0300  vancomycin (VANCOCIN) IVPB 1000 mg/200 mL premix        1,000 mg 200 mL/hr over 60 Minutes Intravenous  Once 06/20/21 0245 06/20/21 0528        Objective: Vitals:   06/22/21 1321 06/22/21 2149 06/23/21 0614 06/23/21 1405  BP: (!) 105/59 125/81 116/71 126/84  Pulse: 60 67 66 63  Resp: 20 20 20 18   Temp: 98 F (36.7 C) 98.4 F (36.9 C) 98.3 F (36.8 C) 98.7 F (37.1 C)  TempSrc: Oral Oral Oral Oral  SpO2: 99% 100% 98% 100%  Weight:      Height:        Intake/Output Summary (Last 24 hours) at 06/23/2021 1544 Last data filed at 06/23/2021 1205 Gross per 24 hour  Intake 1666.55 ml  Output 4950 ml  Net -3283.45 ml    Filed Weights   06/20/21 0007  Weight: 68 kg    Examination: General exam: Appears comfortable  HEENT: PERRLA, oral mucosa moist, no sclera icterus or thrush Respiratory system: Clear to auscultation. Respiratory effort normal. Cardiovascular system: S1  & S2 heard, regular rate and rhythm Gastrointestinal system: Abdomen soft, non-tender, nondistended. Normal bowel sounds   Central nervous system: somnolent but awakens and follows commands- has slow response to questions- No focal neurological deficits. Extremities: No cyanosis, clubbing or edema Skin: No rashes or ulcers      Data Reviewed: I have personally reviewed following labs and imaging studies  CBC: Recent Labs  Lab 06/20/21 0042 06/20/21 0554 06/22/21 0400  WBC 14.9* 12.7* 4.6  NEUTROABS 13.6* 9.8* 2.1  HGB 15.4  12.3* 12.4*  HCT 44.5 35.6* 35.6*  MCV 83.8 84.0 83.0  PLT 210 176 580    Basic Metabolic Panel: Recent Labs  Lab 06/20/21 0042 06/20/21 0554 06/22/21 0400 06/22/21 1400 06/23/21 0408  NA 138 135 139  --  138  K 4.5 3.6 3.3*  --  3.2*  CL 105 107 110  --  113*  CO2 23 21* 23  --  19*  GLUCOSE 91 98 94  --  90  BUN 17 16 10   --  <5*  CREATININE 1.14 0.93 1.01  --  0.81  CALCIUM 9.5 8.4* 8.8*  --  8.9  MG  --   --   --  1.8  --     GFR: Estimated Creatinine Clearance: 139.9 mL/min (by C-G formula based on SCr of 0.81 mg/dL). Liver Function Tests: Recent Labs  Lab 06/20/21 0042 06/20/21 0554  AST 51* 33  ALT 43 28  ALKPHOS 80 62  BILITOT 1.1 0.7  PROT 7.9 6.4*  ALBUMIN 4.8 3.9    No results for input(s): LIPASE, AMYLASE in the last 168 hours. No results for input(s): AMMONIA in the last 168 hours. Coagulation Profile: No results for input(s): INR, PROTIME in the last 168 hours. Cardiac Enzymes: Recent Labs  Lab 06/20/21 0042 06/22/21 0400 06/23/21 0408  CKTOTAL 984* 929* 1,398*    BNP (last 3 results) No results for input(s): PROBNP in the last 8760 hours. HbA1C: No results for input(s): HGBA1C in the last 72 hours. CBG: Recent Labs  Lab 06/20/21 2327 06/21/21 0645 06/21/21 1129 06/21/21 1716 06/22/21 1838  GLUCAP 75 74 85 142* 109*    Lipid Profile: No results for input(s): CHOL, HDL, LDLCALC, TRIG, CHOLHDL,  LDLDIRECT in the last 72 hours. Thyroid Function Tests: Recent Labs    06/22/21 0400  TSH 1.729    Anemia Panel: Recent Labs    06/22/21 0400 06/22/21 0907  VITAMINB12 127* 115*    Urine analysis:    Component Value Date/Time   COLORURINE STRAW (A) 06/22/2021 1432   APPEARANCEUR CLEAR 06/22/2021 1432   APPEARANCEUR Clear 08/23/2013 1127   LABSPEC 1.008 06/22/2021 1432   LABSPEC 1.017 08/23/2013 1127   PHURINE 8.0 06/22/2021 1432   GLUCOSEU NEGATIVE 06/22/2021 1432   GLUCOSEU Negative 08/23/2013 1127   HGBUR NEGATIVE 06/22/2021 1432   BILIRUBINUR NEGATIVE 06/22/2021 1432   BILIRUBINUR Negative 08/23/2013 [place] 06/22/2021 1432   PROTEINUR NEGATIVE 06/22/2021 1432   NITRITE NEGATIVE 06/22/2021 1432   LEUKOCYTESUR NEGATIVE 06/22/2021 1432   LEUKOCYTESUR Negative 08/23/2013 1127   Sepsis Labs: @LABRCNTIP (procalcitonin:4,lacticidven:4) ) Recent Results (from the past 240 hour(s))  Resp Panel by RT-PCR (Flu A&B, Covid) Nasopharyngeal Swab     Status: None   Collection Time: 06/20/21  1:19 AM   Specimen: Nasopharyngeal Swab; Nasopharyngeal(NP) swabs in vial transport medium  Result Value Ref Range Status   SARS Coronavirus 2 by RT PCR NEGATIVE NEGATIVE Final    Comment: (NOTE) SARS-CoV-2 target nucleic acids are NOT DETECTED.  The SARS-CoV-2 RNA is generally detectable in upper respiratory specimens during the acute phase of infection. The lowest concentration of SARS-CoV-2 viral copies this assay can detect is 138 copies/mL. A negative result does not preclude SARS-Cov-2 infection and should not be used as the sole basis for treatment or other patient management decisions. A negative result may occur with  improper specimen collection/handling, submission of specimen other than nasopharyngeal swab, presence of viral mutation(s) within the areas targeted by this  assay, and inadequate number of viral copies(<138 copies/mL). A negative result must be  combined with clinical observations, patient history, and epidemiological information. The expected result is Negative.  Fact Sheet for Patients:  EntrepreneurPulse.com.au  Fact Sheet for Healthcare Providers:  IncredibleEmployment.be  This test is no t yet approved or cleared by the [place] FDA and  has been authorized for detection and/or diagnosis of SARS-CoV-2 by FDA under an Emergency Use Authorization (EUA). This EUA will remain  in effect (meaning this test can be used) for the duration of the COVID-19 declaration under Section 564(b)(1) of the Act, 21 U.S.C.section 360bbb-3(b)(1), unless the authorization is terminated  or revoked sooner.       Influenza A by PCR NEGATIVE NEGATIVE Final   Influenza B by PCR NEGATIVE NEGATIVE Final    Comment: (NOTE) The Xpert Xpress SARS-CoV-2/FLU/RSV plus assay is intended as an aid in the diagnosis of influenza from Nasopharyngeal swab specimens and should not be used as a sole basis for treatment. Nasal washings and aspirates are unacceptable for Xpert Xpress SARS-CoV-2/FLU/RSV testing.  Fact Sheet for Patients: EntrepreneurPulse.com.au  Fact Sheet for Healthcare Providers: IncredibleEmployment.be  This test is not yet approved or cleared by the [place] FDA and has been authorized for detection and/or diagnosis of SARS-CoV-2 by FDA under an Emergency Use Authorization (EUA). This EUA will remain in effect (meaning this test can be used) for the duration of the COVID-19 declaration under Section 564(b)(1) of the Act, 21 U.S.C. section 360bbb-3(b)(1), unless the authorization is terminated or revoked.  Performed at [facility], [place] [address] [place], [place] [address]   Urine Culture     Status: None   Collection Time: 06/20/21  2:55 AM   Specimen: Urine, Clean Catch  Result Value Ref Range Status   Specimen Description    Final    URINE, CLEAN CATCH Performed at [facility], [place] [address], [place], [place] [address]    Special Requests   Final    NONE Performed at [facility], [place] [address], [place], [place] [address]    Culture   Final    NO GROWTH Performed at [facility], [place] [address], [place], [place] [address]    Report Status 06/21/2021 FINAL  Final  CSF culture     Status: None   Collection Time: 06/20/21  3:39 AM   Specimen: Back; Cerebrospinal Fluid  Result Value Ref Range Status   Specimen Description   Final    BACK Performed at [place] [address] [place], [place] [address]    Special Requests   Final    NONE Performed at [facility], [place] [address], [place], [place] [address]    Gram Stain   Final    NO ORGANISMS SEEN NO WBC SEEN Gram Stain Report Called to,Read Back By and Verified With: RN J PRAY AT [id number] 06/20/21 CRU[place] A Performed at [facility], [place] [address] [place], [place] [address]    Culture   Final    NO GROWTH 3 DAYS Performed at [facility], [place] [address], [place],  [address]    Report Status 06/23/2021 FINAL  Final  Gram stain     Status: None   Collection Time: 06/20/21  3:39 AM   Specimen: CSF; Cerebrospinal Fluid  Result Value Ref Range Status   Specimen Description CSF LP  Final   Special Requests NONE  Final   Gram Stain   Final    NO ORGANISMS SEEN NO WBC SEEN Gram Stain Report Called to,Read Back By and Verified With: rn j pray at 0442 06/20/21 cru[place]hank a Performed at [facility], [place] [address] [place], [place] [address]    Report Status 06/20/2021 FINAL  Final  Culture, blood (routine x 2)     Status: None (Preliminary result)   Collection Time: 06/20/21  4:01 AM   Specimen: Left Antecubital; Blood  Result Value Ref Range Status   Specimen Description   Final    LEFT  ANTECUBITAL Performed at [place] [address], [place], [place] [address]    Special Requests   Final    BOTTLES DRAWN AEROBIC AND ANAEROBIC Blood Culture adequate volume Performed at [place] [address], [place], [place] [address]    Culture   Final    NO GROWTH 3 DAYS Performed at [facility], [place] [address] [place], [place] [address]    Report Status PENDING  Incomplete  Culture, blood (routine x 2)     Status: None (Preliminary result)   Collection Time: 06/20/21  4:01 AM   Specimen: BLOOD LEFT FOREARM  Result Value Ref Range Status   Specimen Description   Final    BLOOD LEFT FOREARM Performed at [place] [address] [place], [place] [address]    Special Requests   Final    BOTTLES DRAWN AEROBIC AND ANAEROBIC Blood Culture results may not be optimal due to an inadequate volume of blood received in culture bottles Performed at [place] [address], [place], [place] [address]    Culture   Final    NO GROWTH 3 DAYS Performed at [facility], [place] [address] [place], [place] [address]    Report Status PENDING  Incomplete  Respiratory (~20 pathogens) panel by PCR     Status: None   Collection Time: 06/20/21  5:37 AM   Specimen: Nasopharyngeal Swab; Respiratory  Result Value Ref Range Status   Adenovirus NOT DETECTED NOT DETECTED Final   Coronavirus 229E NOT DETECTED NOT DETECTED Final    Comment: (NOTE) The Coronavirus on the Respiratory Panel, DOES NOT test for the novel  Coronavirus (2019 nCoV)    Coronavirus HKU1 NOT DETECTED NOT DETECTED Final   Coronavirus NL63 NOT DETECTED NOT DETECTED Final   Coronavirus OC43 NOT DETECTED NOT DETECTED Final   Metapneumovirus NOT DETECTED NOT DETECTED Final   Rhinovirus / Enterovirus NOT DETECTED NOT DETECTED Final   Influenza A NOT DETECTED NOT DETECTED Final   Influenza B NOT DETECTED NOT DETECTED Final    Parainfluenza Virus 1 NOT DETECTED NOT DETECTED Final   Parainfluenza Virus 2 NOT DETECTED NOT DETECTED Final   Parainfluenza Virus 3 NOT DETECTED NOT DETECTED Final   Parainfluenza Virus 4 NOT DETECTED NOT DETECTED Final   Respiratory Syncytial Virus NOT DETECTED NOT DETECTED Final   Bordetella pertussis NOT DETECTED NOT DETECTED Final   Bordetella Parapertussis NOT DETECTED NOT DETECTED Final   Chlamydophila pneumoniae NOT DETECTED NOT DETECTED Final   Mycoplasma pneumoniae NOT DETECTED NOT DETECTED Final    Comment: Performed at [facility], [place]. [address] [place], [place] [address]         Radiology Studies: No results found.    Scheduled Meds:  clonazePAM  1 mg Oral BID   cyanocobalamin  1,000 mcg Subcutaneous Q1200  KEPPRA (BRAND NAME ONLY) 2000 MG = 2 TABS  2,000 mg Oral BID   potassium chloride  40 mEq Oral Q4H   TOPAMAX (BRAND NAME ONLY) 200 MG = 1 TAB  200 mg Oral BID   Continuous Infusions:  sodium chloride 100 mL/hr at 06/23/21 0600   acyclovir (ZOVIRAX) </= 700 mg IVPB 680 mg (06/23/21 1006)   piperacillin-tazobactam (ZOSYN)  IV 3.375 g (06/23/21 1129)     LOS: 3 days      Debbe Odea, MD [facility] Pager: [email or web address] 06/23/2021, 3:44 PM

## 2021-06-23 NOTE — TOC Initial Note (Signed)
Transition of Care [facility]) - Initial/Assessment Note    Patient Details  Name: Terrence Riley MRN: [id number] Date of Birth: 2000/12/22  Transition of Care [facility]) CM/SW Contact:    Leeroy Cha, RN Phone Number: 06/23/2021, 9:57 AM  Clinical Narrative:                  20 year old male with history of seizure disorder who presents to the hospital for confusion.  The history was obtained from the patient's mother who stated that when she went to check on him in his bedroom, she saw that there was urine all over the floor of the spectrum.  She found him downstairs confused and without any clothes on.  He mentioned to her that he believed he may be taking too many medications but did not elaborate.  She did not see him having seizures yesterday however he did have swelling of his right lower lip which appeared to be from an injury.   In the ED the patient was noted to have a temperature of 101.  He admitted to having a cough and a sore throat.  Due to confusion, an LP was performed.  Blood culture chest x-ray and UA also performed. CT scan of the chest with contrast was performed and this revealed a left upper lobe infiltrate. Influenza and COVID test found to be negative   Subjective: He is still very sleepy today. Barely eating and drinking.      Assessment & Plan:   Principal Problem:   Acute encephalopathy-fever, leukocytosis - ?  Post ictal versus due to infection versus accidental overdose of medications - Urine drug screen was negative - Serum alcohol level and salicylate level were negligible - Head CT was unrevealing -WBC count was 14.9 - Lactic acid was 2.3 and improved to 1.3 - CSF was not consistent with a bacterial infection-31 RBCs were noted, protein level was normal and there was 1 WBC-have discontinued ceftriaxone-neurology recommends we continue acyclovir until HSV PCR returns   Active Problems:   Seizure disorder ([place]) -Difficult to tell if the patient was  postictal as he was not witnessed to have a seizure - Due to his mental status and inability to take oral medications, Lamictal was held and the patient was placed on Vimpat after being given a Vimpat bolus - as he is alert today, will resume Lamictal - Keppra was transitioned to IV at his home dose of 2 g twice daily - 11/25> EEG was performed and it reveals mild diffuse encephalopathy, excessive beta activity seen on the background is most likely due to the effect of benzodiazepine and is a benign EEG pattern-no seizure or epileptiform discharges were seen throughout the recording   Elevated CPK - CPK noted to be 984- ? If secondary to unwitnessed seizure - Rechecked and found to be 929   Hypokalemia - replace   Low Vit B12 - 127 - rechecked and found to be 115 - start replacement     Aspiration pneumonia ? -Left upper lobe infiltrate noted on the CT scan - according to chart the patient had a cough and a sore throat - cont Zosyn to cover for possible aspiration (HCC)  TOC PLAN OF CARE: following for toc needs has a safe home environment  Following for progression. Expected Discharge Plan: Home/Self Care Barriers to Discharge: Continued Medical Work up   Patient Goals and CMS Choice Patient states their goals for this hospitalization and ongoing recovery are:: to go home CMS Medicare.gov  Compare Post Acute Care list provided to:: Patient    Expected Discharge Plan and Services Expected Discharge Plan: Home/Self Care   Discharge Planning Services: CM Consult   Living arrangements for the past 2 months: Single Family Home                                      Prior Living Arrangements/Services Living arrangements for the past 2 months: Single Family Home Lives with:: Significant Other Patient language and need for interpreter reviewed:: Yes Do you feel safe going back to the place where you live?: Yes            Criminal Activity/Legal Involvement Pertinent to  Current Situation/Hospitalization: No - Comment as needed  Activities of Daily Living Home Assistive Devices/Equipment: None ADL Screening (condition at time of admission) Patient's cognitive ability adequate to safely complete daily activities?: No Is the patient deaf or have difficulty hearing?: No Does the patient have difficulty seeing, even when wearing glasses/contacts?: No Does the patient have difficulty concentrating, remembering, or making decisions?: Yes Patient able to express need for assistance with ADLs?: No Does the patient have difficulty dressing or bathing?: Yes Independently performs ADLs?: No Communication: Needs assistance Is this a change from baseline?: Change from baseline, expected to last >3 days Dressing (OT): Needs assistance Is this a change from baseline?: Change from baseline, expected to last >3 days Grooming: Needs assistance Is this a change from baseline?: Change from baseline, expected to last >3 days Feeding: Needs assistance Is this a change from baseline?: Change from baseline, expected to last >3 days Bathing: Needs assistance Is this a change from baseline?: Change from baseline, expected to last >3 days Toileting: Needs assistance Is this a change from baseline?: Change from baseline, expected to last >3days In/Out Bed: Needs assistance Is this a change from baseline?: Change from baseline, expected to last >3 days Walks in Home: Needs assistance Is this a change from baseline?: Change from baseline, expected to last >3 days Does the patient have difficulty walking or climbing stairs?: Yes Weakness of Legs: Both Weakness of Arms/Hands: Both  Permission Sought/Granted                  Emotional Assessment Appearance:: Appears stated age Attitude/Demeanor/Rapport: Engaged Affect (typically observed): Calm Orientation: : Oriented to Place, Oriented to Self, Oriented to  Time, Oriented to Situation Alcohol / Substance Use: Not  Applicable Psych Involvement: No (comment)  Admission diagnosis:  Acute encephalopathy [G93.40] Febrile illness [R50.9] Altered mental status, unspecified altered mental status type [R41.82] Patient Active Problem List   Diagnosis Date Noted   Acute encephalopathy 06/20/2021   Aspiration pneumonia ([place]) 06/20/2021   Febrile illness    Shoulder instability, right    Humeral head fracture, right, with malunion, subsequent encounter    Esophagitis determined by endoscopy 07/24/2020   Labral tear of shoulder, left, subsequent encounter 02/13/2020   Shoulder dislocation, left, subsequent encounter 02/13/2020   Seizure disorder ([place]) 12/22/2019   Migraines 12/22/2019   Mild intermittent asthma 12/22/2019   Migraine without aura and without status migrainosus, not intractable 06/29/2019   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   Altered mental status 03/12/2014   Lethargic 03/09/2014   Environmental allergies 03/01/2014   Gastroesophageal reflux disease with esophagitis 10/06/2013   Localization-related (focal) (partial) symptomatic epilepsy and epileptic syndromes with complex partial seizures, not intractable, without status epilepticus ([place]) 10/06/2013   Spells  07/26/2013   Allergy to other foods 07/13/2013   Prophylactic immunotherapy 07/13/2013   Unspecified asthma, uncomplicated 18/98/4210   Eosinophilic esophagitis 31/28/1188   Gastroesophageal reflux 01/23/2013   Vomiting 01/23/2013   Leukopenia 10/19/2012   Muscle jerks during sleep 10/19/2012   Constipation 08/20/2012   Neutropenia ([place]) 08/20/2012   Allergic rhinitis due to pollen 02/19/2011   PCP:  Eulas Post, MD Pharmacy:   [id number] - [place], [place]. AT [address] [place] [address] Phone: [phone] Fax: [place] [address] [place] [address] Phone: [phone] Fax:  [phone]     Social Determinants of Health (SDOH) Interventions    Readmission Risk Interventions No flowsheet data found.

## 2021-06-23 NOTE — Plan of Care (Signed)
  Problem: Safety: Goal: Ability to remain free from injury will improve Outcome: Progressing   Problem: Skin Integrity: Goal: Risk for impaired skin integrity will decrease Outcome: Progressing   

## 2021-06-23 NOTE — Progress Notes (Signed)
Pharmacy Antibiotic Note  Terrence Riley is a 20 y.o. male admitted on 06/20/2021 with altered mental status- found to have pneumonia.  Antibiotics initially started in the ED to rule out meningitis.  LP performed and is not indicative of meningitis.  Pharmacy has now been consulted for zosyn dosing for aspiration pneumonia.  D4 Acyclovir D4 Zosyn  Plan: Continue Zosyn 3.375g IV q8h (4 hour infusion). Continue acyclovir 10mg /kg IV q8 hours F/u HSV PCR - still pending   Height: 6' (182.9 cm) Weight: 68 kg (150 lb) IBW/kg (Calculated) : 77.6  Temp (24hrs), Avg:98.2 F (36.8 C), Min:98 F (36.7 C), Max:98.4 F (36.9 C)  Recent Labs  Lab 06/20/21 0042 06/20/21 0401 06/20/21 0554 06/22/21 0400 06/23/21 0408  WBC 14.9*  --  12.7* 4.6  --   CREATININE 1.14  --  0.93 1.01 0.81  LATICACIDVEN 2.3* 1.3  --   --   --      Estimated Creatinine Clearance: 139.9 mL/min (by C-G formula based on SCr of 0.81 mg/dL).    Allergies  Allergen Reactions   Lortab [Hydrocodone-Acetaminophen] Hives   Other Anaphylaxis    Peanuts   Zofran [Ondansetron Hcl] Nausea And Vomiting   Citrus Other (See Comments)    Sores in mouth   Dairy Aid [Tilactase] Nausea And Vomiting   Influenza Virus Vaccine Other (See Comments)    Partial paralysis for a couple of weeks per mom   Soy Allergy Other (See Comments)    Seizures    Eggs Or Egg-Derived Products Rash    Pt mother reports pt only avoids eggs alone but can tolerate as an ingredient. [facility] 07/27/13   Hydrocodone-Acetaminophen Rash    Antimicrobials this admission: Acyclovir 11/25 >>  Zosyn 11/25 >>  Vanc 11/25 >> 11/25 Ceftriaxone 11/25 >> 11/25  Dose adjustments this admission:   Microbiology results: 11/25 BCx: ngtd 11/25 UCx:  ngtd 11/25 CSF: no organisms (pending) 11/25 Gram stain LP: no organisms seen (final) 11/25 HSV PCR: pending  Thank you for allowing pharmacy to be a part of this patient's care.  Adrian Saran, PharmD,  BCPS Secure Chat if ?s 06/23/2021 9:32 AM

## 2021-06-24 ENCOUNTER — Inpatient Hospital Stay (HOSPITAL_COMMUNITY)
Admit: 2021-06-24 | Discharge: 2021-06-24 | Disposition: A | Discharge status: Home / Self Care | Payer: No Typology Code available for payment source | Attending: Neurology | Admitting: Neurology

## 2021-06-24 ENCOUNTER — Inpatient Hospital Stay (HOSPITAL_COMMUNITY): Payer: No Typology Code available for payment source

## 2021-06-24 DIAGNOSIS — G40909 Epilepsy, unspecified, not intractable, without status epilepticus: Secondary | ICD-10-CM | POA: Diagnosis not present

## 2021-06-24 DIAGNOSIS — R509 Fever, unspecified: Secondary | ICD-10-CM | POA: Diagnosis not present

## 2021-06-24 DIAGNOSIS — R4182 Altered mental status, unspecified: Secondary | ICD-10-CM | POA: Diagnosis not present

## 2021-06-24 DIAGNOSIS — G9340 Encephalopathy, unspecified: Secondary | ICD-10-CM | POA: Diagnosis not present

## 2021-06-24 DIAGNOSIS — D72828 Other elevated white blood cell count: Secondary | ICD-10-CM | POA: Diagnosis not present

## 2021-06-24 DIAGNOSIS — J690 Pneumonitis due to inhalation of food and vomit: Secondary | ICD-10-CM | POA: Diagnosis not present

## 2021-06-24 LAB — BASIC METABOLIC PANEL
Anion gap: 7 (ref 5–15)
BUN: 5 mg/dL — ABNORMAL LOW (ref 6–20)
CO2: 19 mmol/L — ABNORMAL LOW (ref 22–32)
Calcium: 9.3 mg/dL (ref 8.9–10.3)
Chloride: 111 mmol/L (ref 98–111)
Creatinine, Ser: 1.05 mg/dL (ref 0.61–1.24)
GFR, Estimated: >60 mL/min (ref >60)
Glucose, Bld: 95 mg/dL (ref 70–99)
Potassium: 3.4 mmol/L — ABNORMAL LOW (ref 3.5–5.1)
Sodium: 137 mmol/L (ref 135–145)

## 2021-06-24 LAB — VDRL, CSF: VDRL Quant, CSF: NONREACTIVE

## 2021-06-24 LAB — CK: Total CK: 1985 U/L — ABNORMAL HIGH (ref 49–397)

## 2021-06-24 LAB — RSV(RESPIRATORY SYNCYTIAL VIRUS) AB, BLOOD: RSV Ab: 1:16 {titer} — ABNORMAL HIGH

## 2021-06-24 MED ORDER — KETOROLAC TROMETHAMINE 30 MG/ML IJ SOLN
30.0000 mg | Freq: Once | INTRAMUSCULAR | Status: AC
  Administered 2021-06-24: 30 mg via INTRAVENOUS

## 2021-06-24 MED ORDER — SODIUM CHLORIDE 0.9 % IV SOLN
12.5000 mg | Freq: Four times a day (QID) | INTRAVENOUS | Status: DC | PRN
  Filled 2021-06-24 (×2): qty 0.5

## 2021-06-24 MED ORDER — MAGNESIUM SULFATE 2 GM/50ML IV SOLN
2.0000 g | Freq: Once | INTRAVENOUS | Status: AC
  Administered 2021-06-24: 2 g via INTRAVENOUS

## 2021-06-24 MED ORDER — ONDANSETRON HCL 4 MG/2ML IJ SOLN
INTRAMUSCULAR | Status: AC
  Administered 2021-06-24: 4 mg via INTRAVENOUS
  Filled 2021-06-24: qty 2

## 2021-06-24 MED ORDER — POTASSIUM CHLORIDE 20 MEQ PO PACK
40.0000 meq | PACK | Freq: Once | ORAL | Status: AC
  Administered 2021-06-24: 40 meq via ORAL

## 2021-06-24 MED ORDER — ONDANSETRON HCL 4 MG/2ML IJ SOLN: 4.0000 mg | Freq: Four times a day (QID) | INTRAMUSCULAR | Status: DC | PRN

## 2021-06-24 MED ORDER — POTASSIUM CHLORIDE 20 MEQ PO PACK
PACK | ORAL | Status: AC
  Filled 2021-06-24: qty 2

## 2021-06-24 NOTE — Progress Notes (Signed)
Patient is awaiting a bed at [facility] for overnight EEG - will get EEG done when patient arrives as schedule permits

## 2021-06-24 NOTE — Progress Notes (Signed)
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  The history was obtained from the patient's mother who stated that when she went to check on him in his bedroom, she saw that there was urine all over the floor of the spectrum.  She found him downstairs confused and without any clothes on.  He mentioned to her that he believed he may be taking too many medications but did not elaborate.  She did not see him having seizures yesterday however he did have swelling of his right lower lip which appeared to be from an injury.  In the ED the patient was noted to have a temperature of 101.  He admitted to having a cough and a sore throat.  Due to confusion, an LP was performed.  Blood culture chest x-ray and UA also performed. CT scan of the chest with contrast was performed and this revealed a left upper lobe infiltrate. Influenza and COVID test found to be negative  Subjective: He remains somnolent today- not responding to my questions.     Assessment & Plan:   Principal Problem:   Acute encephalopathy-fever, leukocytosis - ?  Post ictal versus due to infection versus accidental overdose of medications - Urine drug screen was negative - Serum alcohol level and salicylate level were negligible - Head CT was unrevealing -WBC count was 14.9 - Lactic acid was 2.3 and improved to 1.3 - CSF was not consistent with a bacterial infection-31 RBCs were noted, protein level was normal and there was 1 WBC-have discontinued ceftriaxone - neurology recommends we continue acyclovir until HSV PCR returns- HSV resulted today and is negative - have stopped Acyclovir  - unable to find any other cause for his ongoing encephalopathy- he continues to sleep most of the day and  is slow to respond to questions - MRI brain is normal - have spoken with Neuro and asked if we can do a 24 hr EEG on him- Dr Leonel Ramsay is in agreement- he will need to be transferred to [facility] for this- I have made the transfer request- we are awaiting transfer  Active Problems:   Seizure disorder ([place]) -Difficult to tell if the patient was postictal as he was not witnessed to have a seizure - Due to his mental status and inability to take oral medications, Lamictal was held and the patient was placed on Vimpat after being given a Vimpat bolus - as he is alert today, will resume Lamictal - Keppra was transitioned to IV at his home dose of 2 g twice daily - 11/25> EEG was performed and it reveals mild diffuse encephalopathy, excessive beta activity seen on the background is most likely due to the effect of benzodiazepine and is a benign EEG pattern-no seizure or epileptiform discharges were seen throughout the recording - he was transitioned back to oral Keppra and Lamictal - due to poor oral intake (related to somnolence) he remains on continuous IVF  Elevated CPK - CPK noted to be 984 > 929 > 1,398 >1985  ? If secondary to unwitnessed seizure  Hypokalemia - replace  Low Vit B12 - 127 - rechecked and found to be 115 - started replacement with s/c B12 on 11/27 - Intrinsic factor antibiotic level is normal  Aspiration pneumonia ? -Left upper lobe infiltrate noted on the CT scan - according to chart the patient had a cough and a sore throat - cont Zosyn for a total of 5 days to cover for possible aspiration [facility])- stop date is today   Time spent in minutes: 35 DVT prophylaxis: SCDs Start: 06/20/21 0432  Code Status: Full code Family Communication:  Level of Care: Level of care: Progressive Disposition Plan:  Status is: Inpatient  Remains inpatient appropriate because: on IV antibiotics- ongoing confusion  Consultants:  neuro Procedures:  EEG Antimicrobials:   Anti-infectives (From admission, onward)    Start     Dose/Rate Route Frequency Ordered Stop   06/20/21 1700  vancomycin (VANCOREADY) IVPB 1250 mg/250 mL  Status:  Discontinued        1,250 mg 166.7 mL/hr over 90 Minutes Intravenous Every 12 hours 06/20/21 0507 06/20/21 0850   06/20/21 1600  cefTRIAXone (ROCEPHIN) 2 g in sodium chloride 0.9 % 100 mL IVPB  Status:  Discontinued        2 g 200 mL/hr over 30 Minutes Intravenous Every 12 hours 06/20/21 0440 06/20/21 0853   06/20/21 0900  piperacillin-tazobactam (ZOSYN) IVPB 3.375 g        3.375 g 12.5 mL/hr over 240 Minutes Intravenous Every 8 hours 06/20/21 0856 06/25/21 0359   06/20/21 0545  vancomycin (VANCOREADY) IVPB 500 mg/100 mL        500 mg 100 mL/hr over 60 Minutes Intravenous  Once 06/20/21 0444 06/20/21 0558   06/20/21 0500  acyclovir (ZOVIRAX) 680 mg in dextrose 5 % 100 mL IVPB  Status:  Discontinued        10 mg/kg  68 kg 113.6 mL/hr over 60 Minutes Intravenous Every 8 hours 06/20/21 0442 06/24/21 0901   06/20/21 0300  cefTRIAXone (ROCEPHIN) 2 g in sodium chloride 0.9 % 100 mL IVPB        2 g 200 mL/hr over 30 Minutes Intravenous  Once 06/20/21 0245 06/20/21 0439   06/20/21 0300  vancomycin (VANCOCIN) IVPB 1000 mg/200 mL premix        1,000 mg 200 mL/hr over 60 Minutes Intravenous  Once 06/20/21 0245 06/20/21 0528        Objective: Vitals:   06/23/21 1405 06/23/21 2002 06/23/21 2200 06/24/21 0500  BP: 126/84 114/66 118/64 136/73  Pulse: 63 72 70 68  Resp: 18 16  16   Temp: 98.7 F (37.1 C) 98.2 F (36.8 C) 98.4 F (36.9 C) 98.3 F (36.8 C)  TempSrc: Oral Oral Oral Oral  SpO2: 100% 98% 98% 96%  Weight:      Height:        Intake/Output Summary (Last 24 hours) at 06/24/2021 1244 Last data filed at 06/24/2021 1123 Gross per 24 hour  Intake 3013.41 ml  Output 1000 ml  Net 2013.41 ml    Filed Weights   06/20/21 0007  Weight: 68 kg    Examination: General exam: Appears comfortable  HEENT: PERRLA,  oral mucosa moist, no sclera icterus or thrush Respiratory system: Clear to auscultation. Respiratory effort normal. Cardiovascular system: S1 & S2 heard, regular rate and rhythm Gastrointestinal system: Abdomen soft, non-tender, nondistended. Normal bowel sounds   Central nervous system: Alert and oriented. No focal neurological deficits. Extremities: No cyanosis, clubbing or edema Skin: No rashes or ulcers Psychiatry:  Mood & affect appropriate.      Data Reviewed: I have personally reviewed following labs and imaging studies  CBC: Recent Labs  Lab 06/20/21 0042  06/20/21 0554 06/22/21 0400  WBC 14.9* 12.7* 4.6  NEUTROABS 13.6* 9.8* 2.1  HGB 15.4 12.3* 12.4*  HCT 44.5 35.6* 35.6*  MCV 83.8 84.0 83.0  PLT 210 176 998    Basic Metabolic Panel: Recent Labs  Lab 06/20/21 0042 06/20/21 0554 06/22/21 0400 06/22/21 1400 06/23/21 0408 06/24/21 1103  NA 138 135 139  --  138 137  K 4.5 3.6 3.3*  --  3.2* 3.4*  CL 105 107 110  --  113* 111  CO2 23 21* 23  --  19* 19*  GLUCOSE 91 98 94  --  90 95  BUN 17 16 10   --  <5* 5*  CREATININE 1.14 0.93 1.01  --  0.81 1.05  CALCIUM 9.5 8.4* 8.8*  --  8.9 9.3  MG  --   --   --  1.8  --   --     GFR: Estimated Creatinine Clearance: 107.9 mL/min (by C-G formula based on SCr of 1.05 mg/dL). Liver Function Tests: Recent Labs  Lab 06/20/21 0042 06/20/21 0554  AST 51* 33  ALT 43 28  ALKPHOS 80 62  BILITOT 1.1 0.7  PROT 7.9 6.4*  ALBUMIN 4.8 3.9    No results for input(s): LIPASE, AMYLASE in the last 168 hours. No results for input(s): AMMONIA in the last 168 hours. Coagulation Profile: No results for input(s): INR, PROTIME in the last 168 hours. Cardiac Enzymes: Recent Labs  Lab 06/20/21 0042 06/22/21 0400 06/23/21 0408 06/24/21 1103  CKTOTAL 984* 929* 1,398* 1,985*    BNP (last 3 results) No results for input(s): PROBNP in the last 8760 hours. HbA1C: No results for input(s): HGBA1C in the last 72  hours. CBG: Recent Labs  Lab 06/20/21 2327 06/21/21 0645 06/21/21 1129 06/21/21 1716 06/22/21 1838  GLUCAP 75 74 85 142* 109*    Lipid Profile: No results for input(s): CHOL, HDL, LDLCALC, TRIG, CHOLHDL, LDLDIRECT in the last 72 hours. Thyroid Function Tests: Recent Labs    06/22/21 0400  TSH 1.729    Anemia Panel: Recent Labs    06/22/21 0400 06/22/21 0907  VITAMINB12 127* 115*    Urine analysis:    Component Value Date/Time   COLORURINE STRAW (A) 06/22/2021 1432   APPEARANCEUR CLEAR 06/22/2021 1432   APPEARANCEUR Clear 08/23/2013 1127   LABSPEC 1.008 06/22/2021 1432   LABSPEC 1.017 08/23/2013 1127   PHURINE 8.0 06/22/2021 1432   GLUCOSEU NEGATIVE 06/22/2021 1432   GLUCOSEU Negative 08/23/2013 1127   HGBUR NEGATIVE 06/22/2021 1432   BILIRUBINUR NEGATIVE 06/22/2021 1432   BILIRUBINUR Negative 08/23/2013 [place] 06/22/2021 1432   PROTEINUR NEGATIVE 06/22/2021 1432   NITRITE NEGATIVE 06/22/2021 1432   LEUKOCYTESUR NEGATIVE 06/22/2021 1432   LEUKOCYTESUR Negative 08/23/2013 1127   Sepsis Labs: @LABRCNTIP (procalcitonin:4,lacticidven:4) ) Recent Results (from the past 240 hour(s))  Resp Panel by RT-PCR (Flu A&B, Covid) Nasopharyngeal Swab     Status: None   Collection Time: 06/20/21  1:19 AM   Specimen: Nasopharyngeal Swab; Nasopharyngeal(NP) swabs in vial transport medium  Result Value Ref Range Status   SARS Coronavirus 2 by RT PCR NEGATIVE NEGATIVE Final    Comment: (NOTE) SARS-CoV-2 target nucleic acids are NOT DETECTED.  The SARS-CoV-2 RNA is generally detectable in upper respiratory specimens during the acute phase of infection. The lowest concentration of SARS-CoV-2 viral copies this assay can detect is 138 copies/mL. A negative result does not preclude SARS-Cov-2 infection and should not be used as the sole basis for  treatment or other patient management decisions. A negative result may occur with  improper specimen  collection/handling, submission of specimen other than nasopharyngeal swab, presence of viral mutation(s) within the areas targeted by this assay, and inadequate number of viral copies(<138 copies/mL). A negative result must be combined with clinical observations, patient history, and epidemiological information. The expected result is Negative.  Fact Sheet for Patients:  EntrepreneurPulse.com.au  Fact Sheet for Healthcare Providers:  IncredibleEmployment.be  This test is no t yet approved or cleared by the [place] FDA and  has been authorized for detection and/or diagnosis of SARS-CoV-2 by FDA under an Emergency Use Authorization (EUA). This EUA will remain  in effect (meaning this test can be used) for the duration of the COVID-19 declaration under Section 564(b)(1) of the Act, 21 U.S.C.section 360bbb-3(b)(1), unless the authorization is terminated  or revoked sooner.       Influenza A by PCR NEGATIVE NEGATIVE Final   Influenza B by PCR NEGATIVE NEGATIVE Final    Comment: (NOTE) The Xpert Xpress SARS-CoV-2/FLU/RSV plus assay is intended as an aid in the diagnosis of influenza from Nasopharyngeal swab specimens and should not be used as a sole basis for treatment. Nasal washings and aspirates are unacceptable for Xpert Xpress SARS-CoV-2/FLU/RSV testing.  Fact Sheet for Patients: EntrepreneurPulse.com.au  Fact Sheet for Healthcare Providers: IncredibleEmployment.be  This test is not yet approved or cleared by the [place] FDA and has been authorized for detection and/or diagnosis of SARS-CoV-2 by FDA under an Emergency Use Authorization (EUA). This EUA will remain in effect (meaning this test can be used) for the duration of the COVID-19 declaration under Section 564(b)(1) of the Act, 21 U.S.C. section 360bbb-3(b)(1), unless the authorization is terminated or revoked.  Performed at [facility], [place] [address], [place], [place] [address]   Urine Culture     Status: None   Collection Time: 06/20/21  2:55 AM   Specimen: Urine, Clean Catch  Result Value Ref Range Status   Specimen Description   Final    URINE, CLEAN CATCH Performed at [facility], [place] [address] [place], [place] [address]    Special Requests   Final    NONE Performed at [facility], [place] [address] [place], [place] [address]    Culture   Final    NO GROWTH Performed at [facility], [place] [address] [place], [place] [address]    Report Status 06/21/2021 FINAL  Final  CSF culture     Status: None   Collection Time: 06/20/21  3:39 AM   Specimen: Back; Cerebrospinal Fluid  Result Value Ref Range Status   Specimen Description   Final    BACK Performed at [place] [address], [place], [place] [address]    Special Requests   Final    NONE Performed at [facility], [place] [address], [place], [place] [address]    Gram Stain   Final    NO ORGANISMS SEEN NO WBC SEEN Gram Stain Report Called to,Read Back By and Verified With: RN J PRAY AT [phone] 06/20/21 CRUICKSHANK A Performed at [facility], [place] [address] [place], [place] [address]    Culture   Final    NO GROWTH 3 DAYS Performed at [facility], [place] [address], [place], [place] [address]    Report Status 06/23/2021 FINAL  Final  Gram stain     Status: None  Collection Time: 06/20/21  3:39 AM   Specimen: CSF; Cerebrospinal Fluid  Result Value Ref Range Status   Specimen Description CSF LP  Final   Special Requests NONE  Final   Gram Stain   Final    NO ORGANISMS SEEN NO WBC SEEN Gram Stain Report Called to,Read Back By and Verified With: rn j pray at 0442 06/20/21 cruickshank a Performed at [facility], [place] [address] [place], [place] [address]    Report Status 06/20/2021 FINAL  Final   Culture, blood (routine x 2)     Status: None (Preliminary result)   Collection Time: 06/20/21  4:01 AM   Specimen: Left Antecubital; Blood  Result Value Ref Range Status   Specimen Description   Final    LEFT ANTECUBITAL Performed at [place] [address] [place], [place] [address]    Special Requests   Final    BOTTLES DRAWN AEROBIC AND ANAEROBIC Blood Culture adequate volume Performed at [place] [address], [place], [place] [address]    Culture   Final    NO GROWTH 4 DAYS Performed at [facility], [place] [address] [place], [place] [address]    Report Status PENDING  Incomplete  Culture, blood (routine x 2)     Status: None (Preliminary result)   Collection Time: 06/20/21  4:01 AM   Specimen: BLOOD LEFT FOREARM  Result Value Ref Range Status   Specimen Description   Final    BLOOD LEFT FOREARM Performed at [place] [address], [place], [place] [address]    Special Requests   Final    BOTTLES DRAWN AEROBIC AND ANAEROBIC Blood Culture results may not be optimal due to an inadequate volume of blood received in culture bottles Performed at [place] [address] [place], [place] [address]    Culture   Final    NO GROWTH 4 DAYS Performed at [facility], [place] [address], [place], [place] [address]    Report Status PENDING  Incomplete  Respiratory (~20 pathogens) panel by PCR     Status: None   Collection Time: 06/20/21  5:37 AM   Specimen: Nasopharyngeal Swab; Respiratory  Result Value Ref Range Status   Adenovirus NOT DETECTED NOT DETECTED Final   Coronavirus 229E NOT DETECTED NOT DETECTED Final    Comment: (NOTE) The Coronavirus on the Respiratory Panel, DOES NOT test for the novel  Coronavirus (2019 nCoV)    Coronavirus HKU1 NOT DETECTED NOT DETECTED Final   Coronavirus NL63 NOT DETECTED NOT DETECTED Final   Coronavirus OC43 NOT DETECTED NOT DETECTED  Final   Metapneumovirus NOT DETECTED NOT DETECTED Final   Rhinovirus / Enterovirus NOT DETECTED NOT DETECTED Final   Influenza A NOT DETECTED NOT DETECTED Final   Influenza B NOT DETECTED NOT DETECTED Final   Parainfluenza Virus 1 NOT DETECTED NOT DETECTED Final   Parainfluenza Virus 2 NOT DETECTED NOT DETECTED Final   Parainfluenza Virus 3 NOT DETECTED NOT DETECTED Final   Parainfluenza Virus 4 NOT DETECTED NOT DETECTED Final   Respiratory Syncytial Virus NOT DETECTED NOT DETECTED Final   Bordetella pertussis NOT DETECTED NOT DETECTED Final   Bordetella Parapertussis NOT DETECTED NOT DETECTED Final   Chlamydophila pneumoniae NOT DETECTED NOT DETECTED Final   Mycoplasma pneumoniae NOT DETECTED NOT DETECTED Final    Comment: Performed at [facility], [place]. [address], [place],  [address]  Radiology Studies: MR BRAIN W WO CONTRAST  Result Date: 06/23/2021 CLINICAL DATA:  Delirium EXAM: MRI HEAD WITHOUT AND WITH CONTRAST TECHNIQUE: Multiplanar, multiecho pulse sequences of the brain and surrounding structures were obtained without and with intravenous contrast. CONTRAST:  7.18mL GADAVIST GADOBUTROL 1 MMOL/ML IV SOLN COMPARISON:  None. FINDINGS: Brain: No acute infarct, mass effect or extra-axial collection. No acute or chronic hemorrhage. Normal white matter signal, parenchymal volume and CSF spaces. The midline structures are normal. There is no abnormal contrast enhancement. Vascular: Major flow voids are preserved. Skull and upper cervical spine: Normal calvarium and skull base. Visualized upper cervical spine and soft tissues are normal. Sinuses/Orbits:No paranasal sinus fluid levels or advanced mucosal thickening. No mastoid or middle ear effusion. Normal orbits. IMPRESSION: Normal brain MRI. Electronically Signed   By: Ulyses Jarred M.D.   On: 06/23/2021 20:15      Scheduled Meds:  clonazePAM  1 mg Oral BID   cyanocobalamin  1,000 mcg Subcutaneous Q1200   KEPPRA  (BRAND NAME ONLY) 2000 MG = 2 TABS  2,000 mg Oral BID   TOPAMAX (BRAND NAME ONLY) 200 MG = 1 TAB  200 mg Oral BID   Continuous Infusions:  0.9 % NaCl with KCl 20 mEq / L 100 mL/hr at 06/24/21 1123   piperacillin-tazobactam (ZOSYN)  IV Stopped (06/24/21 0659)     LOS: 4 days      Debbe Odea, MD [facility] Pager: [email or web address] 06/24/2021, 12:44 PM

## 2021-06-24 NOTE — Plan of Care (Signed)
  Problem: Safety: Goal: Ability to remain free from injury will improve Outcome: Progressing   Problem: Skin Integrity: Goal: Risk for impaired skin integrity will decrease Outcome: Progressing   

## 2021-06-24 NOTE — Progress Notes (Signed)
EEG complete - results pending 

## 2021-06-24 NOTE — Procedures (Addendum)
Patient Name: Terrence Riley  MRN: [id number]  Epilepsy Attending: Lora Havens  Referring Physician/Provider: Dr Kerney Elbe Date: 06/24/2021 Duration: 24.06 mins  Patient history: 20 year old male who with history of epilepsy who presented with altered mental status.  EEG to evaluate for seizure.   Level of alertness: Awake   AEDs during EEG study: Keppra, Vimpat   Technical aspects: This EEG study was done with scalp electrodes positioned according to the 10-20 International system of electrode placement. Electrical activity was acquired at a sampling rate of 500Hz  and reviewed with a high frequency filter of 70Hz  and a low frequency filter of 1Hz . EEG data were recorded continuously and digitally stored.    Description: The posterior dominant rhythm consists of 9-10 Hz activity of moderate voltage (25-35 uV) seen predominantly in posterior head regions, symmetric and reactive to eye opening and eye closing.  EEG showed intermittent generalized 3 to 6 Hz theta-delta slowing.  There is also an excessive amount of 15 to 18 Hz beta activity with irregular morphology distributed symmetrically and diffusely. Hyperventilation and photic stimulation were not performed.      ABNORMALITY - Intermittent slow, generalized - Excessive beta, generalized   IMPRESSION: This study is suggestive of mild diffuse encephalopathy, nonspecific to etiology. The excessive beta activity seen in the background is most likely due to the effect of benzodiazepine and is a benign EEG pattern. No seizures or epileptiform discharges were seen throughout the recording.  EEG appears similar to previous day.  Dierdra Salameh [name]

## 2021-06-24 NOTE — Progress Notes (Signed)
vLTM started  all impedances are below 10kohms  [facility] to monitor.  Pt event button tested

## 2021-06-25 DIAGNOSIS — G40909 Epilepsy, unspecified, not intractable, without status epilepticus: Secondary | ICD-10-CM | POA: Diagnosis not present

## 2021-06-25 DIAGNOSIS — R509 Fever, unspecified: Secondary | ICD-10-CM | POA: Diagnosis not present

## 2021-06-25 DIAGNOSIS — R4182 Altered mental status, unspecified: Secondary | ICD-10-CM | POA: Diagnosis not present

## 2021-06-25 DIAGNOSIS — G9340 Encephalopathy, unspecified: Secondary | ICD-10-CM | POA: Diagnosis not present

## 2021-06-25 DIAGNOSIS — D72828 Other elevated white blood cell count: Secondary | ICD-10-CM | POA: Diagnosis not present

## 2021-06-25 LAB — MAGNESIUM: Magnesium: 2.1 mg/dL (ref 1.7–2.4)

## 2021-06-25 LAB — CK: Total CK: 1888 U/L — ABNORMAL HIGH (ref 49–397)

## 2021-06-25 LAB — CBC WITH DIFFERENTIAL/PLATELET
Abs Immature Granulocytes: 0 10*3/uL (ref 0.00–0.07)
Basophils Absolute: 0.1 10*3/uL (ref 0.0–0.1)
Basophils Relative: 1 %
Eosinophils Absolute: 0.2 10*3/uL (ref 0.0–0.5)
Eosinophils Relative: 4 %
HCT: 37 % — ABNORMAL LOW (ref 39.0–52.0)
Hemoglobin: 13 g/dL (ref 13.0–17.0)
Immature Granulocytes: 0 %
Lymphocytes Relative: 33 %
Lymphs Abs: 1.6 10*3/uL (ref 0.7–4.0)
MCH: 29.5 pg (ref 26.0–34.0)
MCHC: 35.1 g/dL (ref 30.0–36.0)
MCV: 83.9 fL (ref 80.0–100.0)
Monocytes Absolute: 0.4 10*3/uL (ref 0.1–1.0)
Monocytes Relative: 9 %
Neutro Abs: 2.5 10*3/uL (ref 1.7–7.7)
Neutrophils Relative %: 53 %
Platelets: 184 10*3/uL (ref 150–400)
RBC: 4.41 MIL/uL (ref 4.22–5.81)
RDW: 12 % (ref 11.5–15.5)
WBC: 4.8 10*3/uL (ref 4.0–10.5)
nRBC: 0 % (ref 0.0–0.2)

## 2021-06-25 LAB — BASIC METABOLIC PANEL
Anion gap: 7 (ref 5–15)
BUN: 8 mg/dL (ref 6–20)
CO2: 16 mmol/L — ABNORMAL LOW (ref 22–32)
Calcium: 9.5 mg/dL (ref 8.9–10.3)
Chloride: 115 mmol/L — ABNORMAL HIGH (ref 98–111)
Creatinine, Ser: 1.1 mg/dL (ref 0.61–1.24)
GFR, Estimated: >60 mL/min (ref >60)
Glucose, Bld: 99 mg/dL (ref 70–99)
Potassium: 3.8 mmol/L (ref 3.5–5.1)
Sodium: 138 mmol/L (ref 135–145)

## 2021-06-25 LAB — CULTURE, BLOOD (ROUTINE X 2)
Culture: NO GROWTH
Culture: NO GROWTH
Special Requests: ADEQUATE

## 2021-06-25 LAB — CK TOTAL AND CKMB (NOT AT ARMC)
CK, MB: 1.7 ng/mL (ref 0.5–5.0)
Relative Index: 0.1 (ref 0.0–2.5)
Total CK: 1898 U/L — ABNORMAL HIGH (ref 49–397)

## 2021-06-25 MED ORDER — NONFORMULARY OR COMPOUNDED ITEM: 150.0000 mg | Freq: Two times a day (BID) | Status: DC

## 2021-06-25 MED ORDER — NONFORMULARY OR COMPOUNDED ITEM
200.0000 mg | Freq: Two times a day (BID) | Status: DC
  Administered 2021-06-25 – 2021-06-27 (×4): 200 mg via ORAL
  Filled 2021-06-25 (×5): qty 1

## 2021-06-25 MED ORDER — CLONAZEPAM 0.5 MG PO TABS
0.5000 mg | ORAL_TABLET | Freq: Two times a day (BID) | ORAL | Status: DC
  Administered 2021-06-25 – 2021-06-27 (×4): 0.5 mg via ORAL
  Filled 2021-06-25 (×4): qty 1

## 2021-06-25 NOTE — Progress Notes (Addendum)
PROGRESS NOTE    Terrence Riley   ZOX:[id number]  DOB: 2001-02-13  DOA: 06/20/2021 PCP: Eulas Post, MD   Brief Narrative:  Terrence Riley is a 20 year old male with history of seizure disorder, eosinophilic esophagitis (as a child) who presents to the hospital for confusion.  The history was obtained from the patient's mother who stated that when she went to check on him in his bedroom, she saw that there was urine all over the floor of the spectrum.  She found him downstairs confused and without any clothes on.  He mentioned to her that he believed he may be taking too many medications but did not elaborate.  She did not see him having seizures yesterday however he did have swelling of his right lower lip which appeared to be from an injury.  In the ED the patient was noted to have a temperature of 101.  He admitted to having a cough and a sore throat.  Due to confusion, an LP was performed.  Blood culture chest x-ray and UA also performed. CT scan of the chest with contrast was performed and this revealed a left upper lobe infiltrate. Influenza and COVID test found to be negative  Subjective: Patient seen and examined.  I am seeing him for the first time however he appears to be much improved compared to how he was yesterday, based on my review of the chart from hospitalist yesterday.  Patient is awake, alert and responding to questions appropriately and correctly however he is very slow in response, very unusual for a 20 year old and unusual for him.  Assessment & Plan:   Principal Problem:   Acute encephalopathy-fever, leukocytosis/seizure disorder - Urine drug screen was negative - Serum alcohol level and salicylate level were negligible - Head CT was unrevealing -WBC count was 14.9 upon arrival which has resolved. - Lactic acid was 2.3 and improved to 1.3 - CSF was not consistent with a bacterial infection-31 RBCs were noted, protein level was normal and there was 1  WBC-have discontinued ceftriaxone -He was started on acyclovir per neurology for possible HSV however yesterday HSV resulted today and is negative - stopped Acyclovir  - MRI brain is normal.  He was transferred to [facility] for continuous EEG, per Dr. Hortense Ramal, patient does not have any epileptiform activities on the 24-hour EEG as well.  Per her, most likely cause of patient's encephalopathy is extended postictal phase since patient's Topamax and Keppra levels were low and UDS was negative for benzodiazepines either however he is supposed to be on benzodiazepine, all of this indicates that he was likely not taking his medications leading to seizure.  He has not had any seizures since admission.  He remains on antiseizure medications.    Elevated CPK - CPK noted to be 984 > 929 > 1,398 >1985> 1888> 1898  ? If secondary to unwitnessed seizure.  Per Dr. Hortense Ramal, it may take days for CPK to come down.  Hypokalemia Resolved.  Low Vit B12 - 127 - rechecked and found to be 115 - started replacement with s/c B12 on 11/27 - Intrinsic factor antibiotic level is normal    Aspiration pneumonia ? -Left upper lobe infiltrate noted on the CT scan - according to chart the patient had a cough and a sore throat.  Patient received 5 days of Zosyn.   Time spent in minutes: 34 DVT prophylaxis: SCDs Start: 06/20/21 0432  Code Status: Full code Family Communication: Called his mother and left voicemail.  Addendum 6:56 pm: Just finished a 21-minute call with the mother over the phone.  She was questioning about what the etiology could be for patient's encephalopathy at presentation.  I explained to her that according to the neurologist, presumably he was not taking his medications and he likely had seizure and he is going through extended postictal period.  Mother was not convinced with this explanation.  She claimed that she is sure that patient was taking all the medications.  She also was overly concerned about  vitamin B12 and minimal hypokalemia which was temporarily and which has resolved now.  Patient is already on B12 treatments.  I offered her to test for intrinsic factor antibodies.  She was happy about that but continued to redirect the conversation to seeking more answers about his presentation/encephlopathy.  After me explaining to her what neurologist thinks, over and over again, she became upset and demanded more answers.  I offered her to call neurologist in the morning and having a direct conversation so they can explain more in detail if there are other explanations. She was okay with that and said she will do that tomorrow.   Level of Care: Level of care: Progressive Disposition Plan:  Status is: Inpatient  Remains inpatient appropriate because: Ongoing confusion but improving.  Needs to be assessed by PT OT.  Consultants:  neuro Procedures:  EEG Antimicrobials:  Anti-infectives (From admission, onward)    Start     Dose/Rate Route Frequency Ordered Stop   06/20/21 1700  vancomycin (VANCOREADY) IVPB 1250 mg/250 mL  Status:  Discontinued        1,250 mg 166.7 mL/hr over 90 Minutes Intravenous Every 12 hours 06/20/21 0507 06/20/21 0850   06/20/21 1600  cefTRIAXone (ROCEPHIN) 2 g in sodium chloride 0.9 % 100 mL IVPB  Status:  Discontinued        2 g 200 mL/hr over 30 Minutes Intravenous Every 12 hours 06/20/21 0440 06/20/21 0853   06/20/21 0900  piperacillin-tazobactam (ZOSYN) IVPB 3.375 g  Status:  Discontinued        3.375 g 12.5 mL/hr over 240 Minutes Intravenous Every 8 hours 06/20/21 0856 06/24/21 1248   06/20/21 0545  vancomycin (VANCOREADY) IVPB 500 mg/100 mL        500 mg 100 mL/hr over 60 Minutes Intravenous  Once 06/20/21 0444 06/20/21 0558   06/20/21 0500  acyclovir (ZOVIRAX) 680 mg in dextrose 5 % 100 mL IVPB  Status:  Discontinued        10 mg/kg  68 kg 113.6 mL/hr over 60 Minutes Intravenous Every 8 hours 06/20/21 0442 06/24/21 0901   06/20/21 0300  cefTRIAXone  (ROCEPHIN) 2 g in sodium chloride 0.9 % 100 mL IVPB        2 g 200 mL/hr over 30 Minutes Intravenous  Once 06/20/21 0245 06/20/21 0439   06/20/21 0300  vancomycin (VANCOCIN) IVPB 1000 mg/200 mL premix        1,000 mg 200 mL/hr over 60 Minutes Intravenous  Once 06/20/21 0245 06/20/21 0528        Objective: Vitals:   06/25/21 0013 06/25/21 0408 06/25/21 0816 06/25/21 1209  BP: 104/74 (!) 93/56 111/68 108/75  Pulse: 66 (!) 54 (!) 55 62  Resp: 17 18 18 15   Temp: 98 F (36.7 C) (!) 97.5 F (36.4 C) 97.6 F (36.4 C) 97.8 F (36.6 C)  TempSrc: Oral Oral Oral Oral  SpO2: 100% 99% 99% 100%  Weight:      Height:  Intake/Output Summary (Last 24 hours) at 06/25/2021 1308 Last data filed at 06/24/2021 2328 Gross per 24 hour  Intake 1602.39 ml  Output 100 ml  Net 1502.39 ml    Filed Weights   06/20/21 0007  Weight: 68 kg    Examination:  General exam: Appears calm and comfortable  Respiratory system: Clear to auscultation. Respiratory effort normal. Cardiovascular system: S1 & S2 heard, RRR. No JVD, murmurs, rubs, gallops or clicks. No pedal edema. Gastrointestinal system: Abdomen is nondistended, soft and nontender. No organomegaly or masses felt. Normal bowel sounds heard. Central nervous system: Alert and oriented. No focal neurological deficits but slow in response. Extremities: Symmetric 5 x 5 power. Skin: No rashes, lesions or ulcers.    Data Reviewed: I have personally reviewed following labs and imaging studies  CBC: Recent Labs  Lab 06/20/21 0042 06/20/21 0554 06/22/21 0400 06/25/21 1012  WBC 14.9* 12.7* 4.6 4.8  NEUTROABS 13.6* 9.8* 2.1 2.5  HGB 15.4 12.3* 12.4* 13.0  HCT 44.5 35.6* 35.6* 37.0*  MCV 83.8 84.0 83.0 83.9  PLT 210 176 166 941    Basic Metabolic Panel: Recent Labs  Lab 06/20/21 0554 06/22/21 0400 06/22/21 1400 06/23/21 0408 06/24/21 1103 06/25/21 1012  NA 135 139  --  138 137 138  K 3.6 3.3*  --  3.2* 3.4* 3.8  CL 107 110   --  113* 111 115*  CO2 21* 23  --  19* 19* 16*  GLUCOSE 98 94  --  90 95 99  BUN 16 10  --  <5* 5* 8  CREATININE 0.93 1.01  --  0.81 1.05 1.10  CALCIUM 8.4* 8.8*  --  8.9 9.3 9.5  MG  --   --  1.8  --   --  2.1    GFR: Estimated Creatinine Clearance: 103 mL/min (by C-G formula based on SCr of 1.1 mg/dL). Liver Function Tests: Recent Labs  Lab 06/20/21 0042 06/20/21 0554  AST 51* 33  ALT 43 28  ALKPHOS 80 62  BILITOT 1.1 0.7  PROT 7.9 6.4*  ALBUMIN 4.8 3.9    No results for input(s): LIPASE, AMYLASE in the last 168 hours. No results for input(s): AMMONIA in the last 168 hours. Coagulation Profile: No results for input(s): INR, PROTIME in the last 168 hours. Cardiac Enzymes: Recent Labs  Lab 06/22/21 0400 06/23/21 0408 06/24/21 1103 06/25/21 1012 06/25/21 1109  CKTOTAL 929* 1,398* 1,985* 1,888* 1,898*  CKMB  --   --   --   --  1.7    BNP (last 3 results) No results for input(s): PROBNP in the last 8760 hours. HbA1C: No results for input(s): HGBA1C in the last 72 hours. CBG: Recent Labs  Lab 06/20/21 2327 06/21/21 0645 06/21/21 1129 06/21/21 1716 06/22/21 1838  GLUCAP 75 74 85 142* 109*    Lipid Profile: No results for input(s): CHOL, HDL, LDLCALC, TRIG, CHOLHDL, LDLDIRECT in the last 72 hours. Thyroid Function Tests: No results for input(s): TSH, T4TOTAL, FREET4, T3FREE, THYROIDAB in the last 72 hours.  Anemia Panel: No results for input(s): VITAMINB12, FOLATE, FERRITIN, TIBC, IRON, RETICCTPCT in the last 72 hours.  Urine analysis:    Component Value Date/Time   COLORURINE STRAW (A) 06/22/2021 1432   APPEARANCEUR CLEAR 06/22/2021 1432   APPEARANCEUR Clear 08/23/2013 1127   LABSPEC 1.008 06/22/2021 1432   LABSPEC 1.017 08/23/2013 1127   PHURINE 8.0 06/22/2021 1432   GLUCOSEU NEGATIVE 06/22/2021 1432   GLUCOSEU Negative 08/23/2013 1127   HGBUR  NEGATIVE 06/22/2021 [place] 06/22/2021 1432   BILIRUBINUR Negative 08/23/2013 1127    [place] 06/22/2021 1432   PROTEINUR NEGATIVE 06/22/2021 1432   NITRITE NEGATIVE 06/22/2021 1432   LEUKOCYTESUR NEGATIVE 06/22/2021 1432   LEUKOCYTESUR Negative 08/23/2013 1127   Sepsis Labs: @LABRCNTIP (procalcitonin:4,lacticidven:4) ) Recent Results (from the past 240 hour(s))  Resp Panel by RT-PCR (Flu A&B, Covid) Nasopharyngeal Swab     Status: None   Collection Time: 06/20/21  1:19 AM   Specimen: Nasopharyngeal Swab; Nasopharyngeal(NP) swabs in vial transport medium  Result Value Ref Range Status   SARS Coronavirus 2 by RT PCR NEGATIVE NEGATIVE Final    Comment: (NOTE) SARS-CoV-2 target nucleic acids are NOT DETECTED.  The SARS-CoV-2 RNA is generally detectable in upper respiratory specimens during the acute phase of infection. The lowest concentration of SARS-CoV-2 viral copies this assay can detect is 138 copies/mL. A negative result does not preclude SARS-Cov-2 infection and should not be used as the sole basis for treatment or other patient management decisions. A negative result may occur with  improper specimen collection/handling, submission of specimen other than nasopharyngeal swab, presence of viral mutation(s) within the areas targeted by this assay, and inadequate number of viral copies(<138 copies/mL). A negative result must be combined with clinical observations, patient history, and epidemiological information. The expected result is Negative.  Fact Sheet for Patients:  EntrepreneurPulse.com.au  Fact Sheet for Healthcare Providers:  IncredibleEmployment.be  This test is no t yet approved or cleared by the [place] FDA and  has been authorized for detection and/or diagnosis of SARS-CoV-2 by FDA under an Emergency Use Authorization (EUA). This EUA will remain  in effect (meaning this test can be used) for the duration of the COVID-19 declaration under Section 564(b)(1) of the Act, 21 U.S.C.section  360bbb-3(b)(1), unless the authorization is terminated  or revoked sooner.       Influenza A by PCR NEGATIVE NEGATIVE Final   Influenza B by PCR NEGATIVE NEGATIVE Final    Comment: (NOTE) The Xpert Xpress SARS-CoV-2/FLU/RSV plus assay is intended as an aid in the diagnosis of influenza from Nasopharyngeal swab specimens and should not be used as a sole basis for treatment. Nasal washings and aspirates are unacceptable for Xpert Xpress SARS-CoV-2/FLU/RSV testing.  Fact Sheet for Patients: EntrepreneurPulse.com.au  Fact Sheet for Healthcare Providers: IncredibleEmployment.be  This test is not yet approved or cleared by the [place] FDA and has been authorized for detection and/or diagnosis of SARS-CoV-2 by FDA under an Emergency Use Authorization (EUA). This EUA will remain in effect (meaning this test can be used) for the duration of the COVID-19 declaration under Section 564(b)(1) of the Act, 21 U.S.C. section 360bbb-3(b)(1), unless the authorization is terminated or revoked.  Performed at [facility], [place] [address] [place], [place] [address]   Urine Culture     Status: None   Collection Time: 06/20/21  2:55 AM   Specimen: Urine, Clean Catch  Result Value Ref Range Status   Specimen Description   Final    URINE, CLEAN CATCH Performed at [facility], [place] [address] [place], [place] [address]    Special Requests   Final    NONE Performed at [facility], [place] [address] [place], [place] [address]    Culture   Final    NO GROWTH Performed at [facility], [place] [address], [place], [place] [address]    Report Status 06/21/2021 FINAL  Final  CSF culture     Status: None   Collection Time: 06/20/21  3:39 AM   Specimen: Back; Cerebrospinal Fluid  Result Value Ref Range Status   Specimen Description   Final    BACK Performed at [place] [address], [place], [place] [address]    Special Requests   Final    NONE Performed at [facility], [place] [address] [place], [place] [address]    Gram Stain   Final    NO ORGANISMS SEEN NO WBC SEEN Gram Stain Report Called to,Read Back By and Verified With: RN J PRAY AT [phone] 06/20/21 CRUICKSHANK A Performed at [facility], [place] [address], [place], [place] [address]    Culture   Final    NO GROWTH 3 DAYS Performed at [facility], [place] [address], [place], [place] [address]    Report Status 06/23/2021 FINAL  Final  Gram stain     Status: None   Collection Time: 06/20/21  3:39 AM   Specimen: CSF; Cerebrospinal Fluid  Result Value Ref Range Status   Specimen Description CSF LP  Final   Special Requests NONE  Final   Gram Stain   Final    NO ORGANISMS SEEN NO WBC SEEN Gram Stain Report Called to,Read Back By and Verified With: rn j pray at 0442 06/20/21 cruickshank a Performed at [facility], [place] [address] [place], [place] [address]    Report Status 06/20/2021 FINAL  Final  Culture, blood (routine x 2)     Status: None   Collection Time: 06/20/21  4:01 AM   Specimen: Left Antecubital; Blood  Result Value Ref Range Status   Specimen Description   Final    LEFT ANTECUBITAL Performed at [place] [address], [place], [place] [address]    Special Requests   Final    BOTTLES DRAWN AEROBIC AND ANAEROBIC Blood Culture adequate volume Performed at [place] [address] [place], [place] [address]    Culture   Final    NO GROWTH 5 DAYS Performed at [facility], [place] [address], [place], [place] [address]    Report Status 06/25/2021 FINAL  Final  Culture, blood (routine x 2)     Status: None   Collection Time: 06/20/21  4:01 AM   Specimen: BLOOD LEFT FOREARM  Result Value Ref Range Status   Specimen Description   Final    BLOOD LEFT FOREARM Performed at  [place] [address], [place], [place] [address]    Special Requests   Final    BOTTLES DRAWN AEROBIC AND ANAEROBIC Blood Culture results may not be optimal due to an inadequate volume of blood received in culture bottles Performed at [place] [address], [place], [place] [address]    Culture   Final    NO GROWTH 5 DAYS Performed at [facility], [place] [address] [place],  [address]    Report Status 06/25/2021 FINAL  Final  Respiratory (~20 pathogens) panel by PCR     Status: None   Collection Time: 06/20/21  5:37 AM   Specimen: Nasopharyngeal Swab; Respiratory  Result Value Ref Range Status   Adenovirus NOT DETECTED NOT DETECTED Final   Coronavirus 229E NOT DETECTED NOT DETECTED Final    Comment: (NOTE) The Coronavirus on the Respiratory Panel, DOES NOT test for the novel  Coronavirus (2019  nCoV)    Coronavirus HKU1 NOT DETECTED NOT DETECTED Final   Coronavirus NL63 NOT DETECTED NOT DETECTED Final   Coronavirus OC43 NOT DETECTED NOT DETECTED Final   Metapneumovirus NOT DETECTED NOT DETECTED Final   Rhinovirus / Enterovirus NOT DETECTED NOT DETECTED Final   Influenza A NOT DETECTED NOT DETECTED Final   Influenza B NOT DETECTED NOT DETECTED Final   Parainfluenza Virus 1 NOT DETECTED NOT DETECTED Final   Parainfluenza Virus 2 NOT DETECTED NOT DETECTED Final   Parainfluenza Virus 3 NOT DETECTED NOT DETECTED Final   Parainfluenza Virus 4 NOT DETECTED NOT DETECTED Final   Respiratory Syncytial Virus NOT DETECTED NOT DETECTED Final   Bordetella pertussis NOT DETECTED NOT DETECTED Final   Bordetella Parapertussis NOT DETECTED NOT DETECTED Final   Chlamydophila pneumoniae NOT DETECTED NOT DETECTED Final   Mycoplasma pneumoniae NOT DETECTED NOT DETECTED Final    Comment: Performed at [facility], [place] [address], [place], [place] [address]         Radiology Studies: MR BRAIN W WO CONTRAST  Result Date:  06/23/2021 CLINICAL DATA:  Delirium EXAM: MRI HEAD WITHOUT AND WITH CONTRAST TECHNIQUE: Multiplanar, multiecho pulse sequences of the brain and surrounding structures were obtained without and with intravenous contrast. CONTRAST:  7.56mL GADAVIST GADOBUTROL 1 MMOL/ML IV SOLN COMPARISON:  None. FINDINGS: Brain: No acute infarct, mass effect or extra-axial collection. No acute or chronic hemorrhage. Normal white matter signal, parenchymal volume and CSF spaces. The midline structures are normal. There is no abnormal contrast enhancement. Vascular: Major flow voids are preserved. Skull and upper cervical spine: Normal calvarium and skull base. Visualized upper cervical spine and soft tissues are normal. Sinuses/Orbits:No paranasal sinus fluid levels or advanced mucosal thickening. No mastoid or middle ear effusion. Normal orbits. IMPRESSION: Normal brain MRI. Electronically Signed   By: Ulyses Jarred M.D.   On: 06/23/2021 20:15   EEG adult  Result Date: 06/24/2021 Lora Havens, MD     06/24/2021  5:57 PM Patient Name: TEVON BERHANE MRN: [id number] Epilepsy Attending: Lora Havens Referring Physician/Provider: Dr Kerney Elbe Date: 06/24/2021 Duration: 24.06 mins Patient history: 20 year old male who with history of epilepsy who presented with altered mental status.  EEG to evaluate for seizure.  Level of alertness: Awake  AEDs during EEG study: Keppra, Vimpat  Technical aspects: This EEG study was done with scalp electrodes positioned according to the 10-20 International system of electrode placement. Electrical activity was acquired at a sampling rate of 500Hz  and reviewed with a high frequency filter of 70Hz  and a low frequency filter of 1Hz . EEG data were recorded continuously and digitally stored.  Description: The posterior dominant rhythm consists of 9-10 Hz activity of moderate voltage (25-35 uV) seen predominantly in posterior head regions, symmetric and reactive to eye opening and eye closing.   EEG showed intermittent generalized 3 to 6 Hz theta-delta slowing.  There is also an excessive amount of 15 to 18 Hz beta activity with irregular morphology distributed symmetrically and diffusely. Hyperventilation and photic stimulation were not performed.    ABNORMALITY - Intermittent slow, generalized - Excessive beta, generalized  IMPRESSION: This study is suggestive of mild diffuse encephalopathy, nonspecific to etiology. The excessive beta activity seen in the background is most likely due to the effect of benzodiazepine and is a benign EEG pattern. No seizures or epileptiform discharges were seen throughout the recording. EEG appears similar to previous day. Priyanka O Yadav   Overnight EEG with video  Result  Date: 06/25/2021 Lora Havens, MD     06/25/2021  9:56 AM Patient Name: MIKAH POSS MRN: [id number] Epilepsy Attending: Lora Havens Referring Physician/Provider: Dr Debbe Odea Duration: 06/24/2021 2007 to 06/25/2021 1000  Patient history: 20 year old male who with history of epilepsy who presented with altered mental status.  EEG to evaluate for seizure.  Level of alertness: Awake  AEDs during EEG study: Keppra, TPM, Klonopin Technical aspects: This EEG study was done with scalp electrodes positioned according to the 10-20 International system of electrode placement. Electrical activity was acquired at a sampling rate of 500Hz  and reviewed with a high frequency filter of 70Hz  and a low frequency filter of 1Hz . EEG data were recorded continuously and digitally stored.  Description: The posterior dominant rhythm consists of 9-10 Hz activity of moderate voltage (25-35 uV) seen predominantly in posterior head regions, symmetric and reactive to eye opening and eye closing.  EEG showed intermittent generalized 3 to 6 Hz theta-delta slowing.  There is also an excessive amount of 15 to 18 Hz beta activity with irregular morphology distributed symmetrically and diffusely. Hyperventilation and  photic stimulation were not performed.    ABNORMALITY - Intermittent slow, generalized - Excessive beta, generalized  IMPRESSION: This study is suggestive of mild diffuse encephalopathy, nonspecific to etiology. The excessive beta activity seen in the background is most likely due to the effect of benzodiazepine and is a benign EEG pattern. No seizures or epileptiform discharges were seen throughout the recording.  Priyanka [name]      Scheduled Meds:  clonazePAM  0.5 mg Oral BID   cyanocobalamin  1,000 mcg Subcutaneous Q1200   KEPPRA (BRAND NAME ONLY) 2000 MG = 2 TABS  2,000 mg Oral BID   NONFORMULARY OR COMPOUNDED ITEM 200 mg  200 mg Oral BID   Continuous Infusions:  0.9 % NaCl with KCl 20 mEq / L 100 mL/hr at 06/25/21 1209   promethazine (PHENERGAN) injection (IM or IVPB)       LOS: 5 days   Darliss Cheney, MD [facility] Pager: [email or web address] 06/25/2021, 1:08 PM

## 2021-06-25 NOTE — Progress Notes (Signed)
Subjective: No clinical seizure-like episodes overnight.  Patient denies any muscle pain, headache, weakness.  Smiling and only answering in monosyllables.  ROS: negative except above  Examination  Vital signs in last 24 hours: Temp:  [97.4 F (36.3 C)-98.4 F (36.9 C)] 97.6 F (36.4 C) (11/30 0816) Pulse Rate:  [54-66] 55 (11/30 0816) Resp:  [17-18] 18 (11/30 0816) BP: (93-118)/(56-82) 111/68 (11/30 0816) SpO2:  [99 %-100 %] 99 % (11/30 0816)  General: lying in bed, not in apparent distress CVS: pulse-normal rate and rhythm RS: breathing comfortably, CTAB Extremities: normal, warm  Neuro: MS: Opens eyes to stimulation, alert, oriented to time place person, follows simple commands, unable to assess attention as patient smiles and does not engage in conversation CN: pupils equal and reactive,  EOMI, face symmetric, tongue midline, normal sensation over face, Motor: 5/5 strength in all 4 extremities Reflexes: 2+ bilaterally over patella, biceps, plantars: flexor Coordination: normal Gait: not tested  Basic Metabolic Panel: Recent Labs  Lab 06/20/21 0042 06/20/21 0554 06/22/21 0400 06/22/21 1400 06/23/21 0408 06/24/21 1103  NA 138 135 139  --  138 137  K 4.5 3.6 3.3*  --  3.2* 3.4*  CL 105 107 110  --  113* 111  CO2 23 21* 23  --  19* 19*  GLUCOSE 91 98 94  --  90 95  BUN 17 16 10   --  <5* 5*  CREATININE 1.14 0.93 1.01  --  0.81 1.05  CALCIUM 9.5 8.4* 8.8*  --  8.9 9.3  MG  --   --   --  1.8  --   --     CBC: Recent Labs  Lab 06/20/21 0042 06/20/21 0554 06/22/21 0400  WBC 14.9* 12.7* 4.6  NEUTROABS 13.6* 9.8* 2.1  HGB 15.4 12.3* 12.4*  HCT 44.5 35.6* 35.6*  MCV 83.8 84.0 83.0  PLT 210 176 166     Coagulation Studies: No results for input(s): LABPROT, INR in the last 72 hours.  Imaging MRI brain with and without contrast 06/23/2021: No acute abnormality.  ASSESSMENT AND PLAN: 20 year old male with history of epilepsy who presented with altered mental  status.  His CK was elevated and AED levels subtherapeutic.  Epilepsy with suspected breakthrough seizure Rhabdomyolysis -Patient came in with altered mental status with subtherapeutic AED levels as well as elevated CK.  He tells me he was taking his medications but I do not think he was compliant because of his subtherapeutic medication levels as well as absence of benzodiazepines on urine drug screen.  Recommendations -Continue Keppra 2000 mg twice daily and topiramate 200 mg twice daily -Ideally, I would not have reduced antiepileptic doses when patient presents with a seizure.  However, because this patient is noncompliant and has had prolonged encephalopathy after seizure, I would reduce clonazepam 0.5 mg twice daily to minimize sedation -Patient's CK levels have been trending upwards.  However, patient denies any clinical symptoms including myalgias and his renal function is within normal limits.  CK level can continue to trend up for about days after injury.  Therefore will continue to trend for now -Continue seizure precautions -As needed IV Ativan 2 mg for clinical seizure-like activity -Management of rest of comorbidities per primary team  I have spent a total of  36  minutes with the patient reviewing hospital notes,  test results, labs and examining the patient as well as establishing an assessment and plan that was discussed personally with the patient, RN and Dr Doristine Bosworth.  >  50% of time was spent in direct patient care.    Zeb Comfort Epilepsy [facility] For questions after 5pm please refer to AMION to reach the Neurologist on call

## 2021-06-25 NOTE — Procedures (Addendum)
Patient Name: Terrence Riley  MRN: [id number]  Epilepsy Attending: Lora Havens  Referring Physician/Provider: Dr Debbe Odea Duration: 06/24/2021 2007 to 06/25/2021 2007   Patient history: 20 year old male who with history of epilepsy who presented with altered mental status.  EEG to evaluate for seizure.   Level of alertness: Awake, asleep   AEDs during EEG study: Keppra, TPM, Klonopin  Technical aspects: This EEG study was done with scalp electrodes positioned according to the 10-20 International system of electrode placement. Electrical activity was acquired at a sampling rate of 500Hz  and reviewed with a high frequency filter of 70Hz  and a low frequency filter of 1Hz . EEG data were recorded continuously and digitally stored.    Description: The posterior dominant rhythm consists of 9-10 Hz activity of moderate voltage (25-35 uV) seen predominantly in posterior head regions, symmetric and reactive to eye opening and eye closing.  Sleep was characterized by vertex waves, sleep spindles (12 to 14 Hz), maximum frontocentral region.  EEG showed intermittent generalized 3 to 6 Hz theta-delta slowing.  There is also an excessive amount of 15 to 18 Hz beta activity with irregular morphology distributed symmetrically and diffusely. Hyperventilation and photic stimulation were not performed.      ABNORMALITY - Intermittent slow, generalized - Excessive beta, generalized   IMPRESSION: This study is suggestive of mild diffuse encephalopathy, nonspecific to etiology. The excessive beta activity seen in the background is most likely due to the effect of benzodiazepine and is a benign EEG pattern. No seizures or epileptiform discharges were seen throughout the recording.    Micala Saltsman [name]

## 2021-06-25 NOTE — Progress Notes (Signed)
OT Cancellation Note  Patient Details Name: Terrence Riley MRN: [id number] DOB: Apr 22, 2001   Cancelled Treatment:    Reason Eval/Treat Not Completed: Other (comment) Discussed with PT. Pt currently on EEG, will follow up tomorrow.  Ramond Dial, OT/L   Acute OT Clinical Specialist Acute Rehabilitation Services Pager [phone] Office [phone]  06/25/2021, 2:29 PM

## 2021-06-25 NOTE — Evaluation (Signed)
Physical Therapy Evaluation Patient Details Name: Terrence Riley MRN: [id number] DOB: 01-01-2001 Today's Date: 06/25/2021  History of Present Illness  Pt is a 20 y/o male admitted secondary to AMS and possible seizures. PMH includes seizures and asthma.  Clinical Impression  Pt admitted secondary to problem above with deficits below. Pt requiring supervision for bed mobility and min guard to stand and take steps at EOB. Further mobility limited as pt connected to long term EEG. Pt with slow processing throughout; unsure of baseline. Anticipate pt will progress well and will not require follow up PT. If cognitive deficits persist, will likely need increased supervision initially at home for safety. Will continue to follow acutely.        Recommendations for follow up therapy are one component of a multi-disciplinary discharge planning process, led by the attending physician.  Recommendations may be updated based on patient status, additional functional criteria and insurance authorization.  Follow Up Recommendations No PT follow up    Assistance Recommended at Discharge Frequent or constant Supervision/Assistance  Functional Status Assessment Patient has had a recent decline in their functional status and demonstrates the ability to make significant improvements in function in a reasonable and predictable amount of time.  Equipment Recommendations  None recommended by PT    Recommendations for Other Services       Precautions / Restrictions Precautions Precautions: Fall Restrictions Weight Bearing Restrictions: No      Mobility  Bed Mobility Overal bed mobility: Needs Assistance Bed Mobility: Supine to Sit;Sit to Supine     Supine to sit: Supervision Sit to supine: Supervision   General bed mobility comments: Supervision for safety and line management.    Transfers Overall transfer level: Needs assistance Equipment used: 1 person hand held assist Transfers: Sit to/from  Stand Sit to Stand: Min guard           General transfer comment: Min guard A for safety no LOB noted. Able to take steps to/from EOB without LOB. Unable to attempt further mobility as pt connected to EEG wires.    Ambulation/Gait                  Stairs            Wheelchair Mobility    Modified Rankin (Stroke Patients Only)       Balance Overall balance assessment: Needs assistance Sitting-balance support: No upper extremity supported;Feet supported Sitting balance-Leahy Scale: Good     Standing balance support: No upper extremity supported Standing balance-Leahy Scale: Fair                               Pertinent Vitals/Pain Pain Assessment: Faces Faces Pain Scale: Hurts a little bit Pain Location: low back Pain Descriptors / Indicators: Grimacing;Guarding Pain Intervention(s): Limited activity within patient's tolerance;Monitored during session    Home Living Family/patient expects to be discharged to:: Private residence Living Arrangements: Parent Available Help at Discharge: Family Type of Home: Other(Comment) (condo) Home Access: Level entry     Alternate Level Stairs-Number of Steps: flight Home Layout: Two level Home Equipment: None      Prior Function Prior Level of Function : Independent/Modified Independent;Working/employed                     Hand Dominance        Extremity/Trunk Assessment   Upper Extremity Assessment Upper Extremity Assessment: Defer to OT evaluation  Lower Extremity Assessment Lower Extremity Assessment: Overall WFL for tasks assessed    Cervical / Trunk Assessment Cervical / Trunk Assessment: Normal  Communication   Communication: Other (comment) (very soft spoken)  Cognition Arousal/Alertness: Awake/alert Behavior During Therapy: WFL for tasks assessed/performed Overall Cognitive Status: No family/caregiver present to determine baseline cognitive functioning                                  General Comments: Slow to respond to questions at times. A and O X3. Did not know why he was in the hospital.  Unsure of baseline        General Comments General comments (skin integrity, edema, etc.): No family present    Exercises     Assessment/Plan    PT Assessment Patient needs continued PT services  PT Problem List Decreased balance;Decreased mobility;Decreased cognition;Decreased safety awareness       PT Treatment Interventions Gait training;Therapeutic activities;Functional mobility training;Stair training;Therapeutic exercise;Balance training;Patient/family education;Cognitive remediation    PT Goals (Current goals can be found in the Care Plan section)  Acute Rehab PT Goals Patient Stated Goal: to go home PT Goal Formulation: With patient Time For Goal Achievement: 07/09/21 Potential to Achieve Goals: Good    Frequency Min 3X/week   Barriers to discharge        Co-evaluation               AM-PAC PT "6 Clicks" Mobility  Outcome Measure Help needed turning from your back to your side while in a flat bed without using bedrails?: None Help needed moving from lying on your back to sitting on the side of a flat bed without using bedrails?: None Help needed moving to and from a bed to a chair (including a wheelchair)?: A Little Help needed standing up from a chair using your arms (e.g., wheelchair or bedside chair)?: A Little Help needed to walk in hospital room?: A Little Help needed climbing 3-5 steps with a railing? : A Little 6 Click Score: 20    End of Session   Activity Tolerance: Patient tolerated treatment well Patient left: in bed;with call bell/phone within reach;with bed alarm set Nurse Communication: Mobility status PT Visit Diagnosis: Other symptoms and signs involving the nervous system (O03.212)    Time: 2482-5003 PT Time Calculation (min) (ACUTE ONLY): 17 min   Charges:   PT Evaluation $PT Eval  Moderate Complexity: 1 Mod          Terrence Riley, DPT  Acute Rehabilitation Services  Pager: [phone] Office: [phone]   Rudean Hitt 06/25/2021, 3:57 PM

## 2021-06-26 DIAGNOSIS — M6282 Rhabdomyolysis: Secondary | ICD-10-CM

## 2021-06-26 DIAGNOSIS — E538 Deficiency of other specified B group vitamins: Secondary | ICD-10-CM | POA: Diagnosis not present

## 2021-06-26 DIAGNOSIS — E876 Hypokalemia: Secondary | ICD-10-CM

## 2021-06-26 DIAGNOSIS — G40909 Epilepsy, unspecified, not intractable, without status epilepticus: Secondary | ICD-10-CM | POA: Diagnosis not present

## 2021-06-26 DIAGNOSIS — J690 Pneumonitis due to inhalation of food and vomit: Secondary | ICD-10-CM | POA: Diagnosis not present

## 2021-06-26 DIAGNOSIS — G9340 Encephalopathy, unspecified: Secondary | ICD-10-CM | POA: Diagnosis not present

## 2021-06-26 DIAGNOSIS — R4182 Altered mental status, unspecified: Secondary | ICD-10-CM | POA: Diagnosis not present

## 2021-06-26 LAB — PATHOLOGIST SMEAR REVIEW

## 2021-06-26 LAB — INTRINSIC FACTOR ANTIBODIES: Intrinsic Factor: 3.4 AU/mL — ABNORMAL HIGH (ref 0.0–1.1)

## 2021-06-26 LAB — BASIC METABOLIC PANEL
Anion gap: 7 (ref 5–15)
BUN: 12 mg/dL (ref 6–20)
CO2: 19 mmol/L — ABNORMAL LOW (ref 22–32)
Calcium: 9.3 mg/dL (ref 8.9–10.3)
Chloride: 112 mmol/L — ABNORMAL HIGH (ref 98–111)
Creatinine, Ser: 1.02 mg/dL (ref 0.61–1.24)
GFR, Estimated: >60 mL/min (ref >60)
Glucose, Bld: 91 mg/dL (ref 70–99)
Potassium: 3.7 mmol/L (ref 3.5–5.1)
Sodium: 138 mmol/L (ref 135–145)

## 2021-06-26 LAB — CK: Total CK: 1130 U/L — ABNORMAL HIGH (ref 49–397)

## 2021-06-26 MED ORDER — IBUPROFEN 200 MG PO TABS: 200.0000 mg | ORAL_TABLET | Freq: Four times a day (QID) | ORAL | Status: DC | PRN

## 2021-06-26 MED ORDER — KETOROLAC TROMETHAMINE 30 MG/ML IJ SOLN
30.0000 mg | Freq: Once | INTRAMUSCULAR | Status: AC
Start: 2021-06-26 — End: 2021-06-26
  Administered 2021-06-26: 30 mg via INTRAVENOUS
  Filled 2021-06-26: qty 1

## 2021-06-26 NOTE — Procedures (Addendum)
Patient Name: Terrence Riley  MRN: [id number]  Epilepsy Attending: Lora Havens  Referring Physician/Provider: Dr Debbe Odea Duration: 06/25/2021 2007 to 05/27/2021 1002   Patient history: 20 year old male who with history of epilepsy who presented with altered mental status.  EEG to evaluate for seizure.   Level of alertness: Awake, asleep   AEDs during EEG study: Keppra, TPM, Klonopin   Technical aspects: This EEG study was done with scalp electrodes positioned according to the 10-20 International system of electrode placement. Electrical activity was acquired at a sampling rate of 500Hz  and reviewed with a high frequency filter of 70Hz  and a low frequency filter of 1Hz . EEG data were recorded continuously and digitally stored.    Description: The posterior dominant rhythm consists of 9-10 Hz activity of moderate voltage (25-35 uV) seen predominantly in posterior head regions, symmetric and reactive to eye opening and eye closing.  Sleep was characterized by vertex waves, sleep spindles (12 to 14 Hz), maximum frontocentral region.  EEG showed intermittent generalized 3 to 6 Hz theta-delta slowing.  There is also an excessive amount of 15 to 18 Hz beta activity with irregular morphology distributed symmetrically and diffusely. Hyperventilation and photic stimulation were not performed.      ABNORMALITY - Intermittent slow, generalized - Excessive beta, generalized   IMPRESSION: This study is suggestive of mild diffuse encephalopathy, nonspecific to etiology. The excessive beta activity seen in the background is most likely due to the effect of benzodiazepine and is a benign EEG pattern. No seizures or epileptiform discharges were seen throughout the recording.    [name] [name]

## 2021-06-26 NOTE — Evaluation (Signed)
Occupational Therapy Evaluation Patient Details Name: Terrence Riley MRN: [id number] DOB: 03/16/2001 Today's Date: 06/26/2021   History of Present Illness Pt is a 20 y/o male admitted secondary to AMS and possible seizures. PMH includes seizures and asthma.   Clinical Impression   Pt admitted with above. He demonstrates the below listed deficits and will benefit from continued OT to maximize safety and independence with BADLs.  Pt presents to OT with mildly decreased activity tolerance as well as possible cognitive deficits - no family present, but pt indicates he has been attending university/college, and currently he is not functioning at that capacity.  He is able to perform ADLs with supervision and should quickly progress to independence.  Recommend OPOT at discharge for cognition and IADLs.        Recommendations for follow up therapy are one component of a multi-disciplinary discharge planning process, led by the attending physician.  Recommendations may be updated based on patient status, additional functional criteria and insurance authorization.   Follow Up Recommendations  Outpatient OT    Assistance Recommended at Discharge Intermittent Supervision/Assistance  Functional Status Assessment  Patient has had a recent decline in their functional status and demonstrates the ability to make significant improvements in function in a reasonable and predictable amount of time.  Equipment Recommendations  None recommended by OT    Recommendations for Other Services       Precautions / Restrictions Precautions Precautions: Fall      Mobility Bed Mobility Overal bed mobility: Independent                  Transfers Overall transfer level: Needs assistance Equipment used: None Transfers: Sit to/from Stand;Bed to chair/wheelchair/BSC Sit to Stand: Supervision     Step pivot transfers: Supervision            Balance Overall balance assessment: Needs  assistance Sitting-balance support: No upper extremity supported;Feet supported Sitting balance-Leahy Scale: Good     Standing balance support: No upper extremity supported Standing balance-Leahy Scale: Good                             ADL either performed or assessed with clinical judgement   ADL Overall ADL's : Needs assistance/impaired Eating/Feeding: Independent   Grooming: Wash/dry hands;Wash/dry face;Oral care;Brushing hair;Supervision/safety;Standing   Upper Body Bathing: Supervision/ safety;Sitting;Set up   Lower Body Bathing: Supervison/ safety;Sit to/from stand   Upper Body Dressing : Set up;Supervision/safety;Sitting   Lower Body Dressing: Supervision/safety;Sit to/from stand   Toilet Transfer: Supervision/safety;Ambulation;Comfort height toilet   Toileting- Clothing Manipulation and Hygiene: Supervision/safety;Sit to/from stand       Functional mobility during ADLs: Supervision/safety       Vision Patient Visual Report: No change from baseline       Perception     Praxis      Pertinent Vitals/Pain Pain Assessment: No/denies pain     Hand Dominance     Extremity/Trunk Assessment Upper Extremity Assessment Upper Extremity Assessment: Overall WFL for tasks assessed (recent shoulder surgery Rt)   Lower Extremity Assessment Lower Extremity Assessment: Defer to PT evaluation   Cervical / Trunk Assessment Cervical / Trunk Assessment: Normal   Communication Communication Communication: No difficulties   Cognition Arousal/Alertness: Awake/alert Behavior During Therapy: WFL for tasks assessed/performed  General Comments: Pt is oriented, and pleasant.  He was somewhat childlike in his behaviors.  He provided multiple different answers to schools he was attending, so unsure if he is confabulating, or if this info is correct, but the timeline is off.  He demonstrated intermittent delay with  processing.  He was able to follow a 3 step command, and perform path finding task using hospital signage without error, and then was able to find his way back to his room.     General Comments  Attempted to contact mother to determine pt baseline, but no answer    Exercises     Shoulder Instructions      Home Living Family/patient expects to be discharged to:: Private residence Living Arrangements: Parent Available Help at Discharge: Family Type of Home: Other(Comment) Home Access: Level entry     Home Layout: Two level Alternate Level Stairs-Number of Steps: flight Alternate Level Stairs-Rails: Right Bathroom Shower/Tub: Teacher, early years/pre: Standard     Home Equipment: None          Prior Functioning/Environment Prior Level of Function : Independent/Modified Independent;Working/employed               ADLs Comments: 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  He indicates that he was in school, but provided 5 different universities/colleges that he has been attending, so am not sure of the validity of this info.  He states he plans to study [employer]        OT Problem List:        OT Treatment/Interventions: Self-care/ADL training;Cognitive remediation/compensation;Therapeutic activities;Patient/family education    OT Goals(Current goals can be found in the care plan section) Acute Rehab OT Goals Patient Stated Goal: to go home OT Goal Formulation: With patient Time For Goal Achievement: 07/10/21 Potential to Achieve Goals: Good ADL Goals Additional ADL Goal #1: Pt will be independent with ADLs Additional ADL Goal #2: Pt will be independent performing a complex path finding task using hospital maps Additional ADL Goal #3: Pt will be able to alternate and divide attention independently during a novel task  OT Frequency: Min 2X/week   Barriers to  D/C:            Co-evaluation              AM-PAC OT "6 Clicks" Daily Activity     Outcome Measure Help from another person eating meals?: None Help from another person taking care of personal grooming?: A Little Help from another person toileting, which includes using toliet, bedpan, or urinal?: A Little Help from another person bathing (including washing, rinsing, drying)?: A Little Help from another person to put on and taking off regular upper body clothing?: A Little Help from another person to put on and taking off regular lower body clothing?: A Little 6 Click Score: 19   End of Session Nurse Communication: Mobility status  Activity Tolerance: Patient tolerated treatment well Patient left: in bed;with call bell/phone within reach;with bed alarm set;with family/visitor present  OT Visit Diagnosis: Cognitive communication deficit (R41.841)                Time: 1694-5038 OT Time Calculation (min): 30 min Charges:  OT General Charges $OT Visit: 1 Visit OT Evaluation $OT Eval Moderate Complexity: 1 Mod OT Treatments $Therapeutic Activity: 8-22 mins  Nilsa Nutting., OTR/L Acute Rehabilitation Services Pager [phone] Office  [phone]   Lucille Passy M 06/26/2021, 5:03 PM

## 2021-06-26 NOTE — Progress Notes (Addendum)
Subjective: No acute events overnight.  Patient reports mild bifrontal headache.  Patient requesting to go home.  Patient's brother at bedside.   ROS: negative except above  Examination  Vital signs in last 24 hours: Temp:  [97.5 F (36.4 C)-98.2 F (36.8 C)] 98 F (36.7 C) (12/01 1155) Pulse Rate:  [57-71] 71 (12/01 1155) Resp:  [16-18] 18 (12/01 1155) BP: (105-112)/(65-71) 112/65 (12/01 1155) SpO2:  [99 %-100 %] 100 % (12/01 1155)  General: lying in bed, not in apparent distress CVS: pulse-normal rate and rhythm RS: breathing comfortably, CTAB Extremities: normal, warm   Neuro: MS: Awake, alert, oriented x3, attention intact, unable to do simple addition and subtraction CN: pupils equal and reactive,  EOMI, face symmetric, tongue midline, normal sensation over face, Motor: 5/5 strength in all 4 extremities Reflexes: 2+ bilaterally over patella, biceps, plantars: flexor Coordination: normal Gait: not tested  Basic Metabolic Panel: Recent Labs  Lab 06/22/21 0400 06/22/21 1400 06/23/21 0408 06/24/21 1103 06/25/21 1012 06/26/21 0707  NA 139  --  138 137 138 138  K 3.3*  --  3.2* 3.4* 3.8 3.7  CL 110  --  113* 111 115* 112*  CO2 23  --  19* 19* 16* 19*  GLUCOSE 94  --  90 95 99 91  BUN 10  --  <5* 5* 8 12  CREATININE 1.01  --  0.81 1.05 1.10 1.02  CALCIUM 8.8*  --  8.9 9.3 9.5 9.3  MG  --  1.8  --   --  2.1  --     CBC: Recent Labs  Lab 06/20/21 0042 06/20/21 0554 06/22/21 0400 06/25/21 1012  WBC 14.9* 12.7* 4.6 4.8  NEUTROABS 13.6* 9.8* 2.1 2.5  HGB 15.4 12.3* 12.4* 13.0  HCT 44.5 35.6* 35.6* 37.0*  MCV 83.8 84.0 83.0 83.9  PLT 210 176 166 184     Coagulation Studies: No results for input(s): LABPROT, INR in the last 72 hours.  Imaging No new brain imaging overnight  ASSESSMENT AND PLAN: 20 year old male with history of epilepsy who presented with altered mental status.  His CK was elevated and AED levels subtherapeutic.   Epilepsy with suspected  breakthrough seizure Rhabdomyolysis Headache -Patient came in with altered mental status, urinary incontinence, lip bite. He had subtherapeutic AED levels, leucocytosis as well as elevated CK. He tells me he was taking his medications but I do not think he was compliant because of his subtherapeutic medication levels as well as absence of benzodiazepines on urine drug screen. -Most likely patient had breakthrough seizures due to medication noncompliance.  Recommendations -Continue Keppra 2000 mg twice daily and topiramate 200 mg twice daily -Ideally, I would not have reduced antiepileptic doses when patient presents with a seizure.  However, because this patient is noncompliant and has had prolonged encephalopathy after seizure, I reduced clonazepam 0.5 mg twice daily to minimize sedation -Continue seizure precautions -As needed IV Ativan 2 mg for clinical seizure-like activity -Management of rest of comorbidities per primary team -Called mom and explained my assessment and plan in great detail.  Patient's mom was appreciative of the phone call.  She did state that she is not comfortable taking patient home yet because she is concerned that he is not able to independently walk and his mental processing is still slow.  I will place an order for repeat PT consult.  As for his mental status, this is longer than typical postictal..  However, patient is continuing to show improvement every day.  We have already performed MRI brain as well as lumbar puncture which did not show any acute abnormality.  Therefore, I would recommend watchful waiting.   - One-time dose of IV Toradol for headache -Recommend close follow-up with neurology in 3 to 4 weeks  I have spent a total of  36  minutes with the patient reviewing hospital notes,  test results, labs and examining the patient as well as establishing an assessment and plan that was discussed personally with the patient, RN and Dr Doristine Bosworth.  > 50% of time was  spent in direct patient care.   Zeb Comfort Epilepsy [facility] For questions after 5pm please refer to AMION to reach the Neurologist on call

## 2021-06-26 NOTE — Progress Notes (Signed)
LTM EEG discontinued - no skin breakdown at unhook.   

## 2021-06-26 NOTE — Progress Notes (Signed)
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  The history was obtained from the patient's mother who stated that when she went to check on him in his bedroom, she saw that there was urine all over the floor of the spectrum.  She found him downstairs confused and without any clothes on.  He mentioned to her that he believed he may be taking too many medications but did not elaborate.  She did not see him having seizures yesterday however he did have swelling of his right lower lip which appeared to be from an injury. In the ED the patient was noted to have a temperature of 101.  He admitted to having a cough and a sore throat.  Due to confusion, an LP was performed.  Blood culture chest x-ray and UA also performed. CT scan of the chest with contrast was performed and this revealed a left upper lobe infiltrate. Influenza and COVID test found to be negative  Subjective / 24h Interval events: Doing well this morning, smiling. No complaints, no further clinical seizures  Assessment & Plan: Principal Problem:   Acute encephalopathy Active Problems:   Seizure disorder (HCC)   Aspiration pneumonia (HCC)   Hypokalemia   B12 deficiency   Rhabdomyolysis   Principal Problem: Acute encephalopathy-fever, leukocytosis/seizure disorder -patient was admitted to the hospital with acute encephalopathy.  Due to concern for seizures neurology was consulted and followed patient while hospitalized.  Urine drug screen was negative, alcohol and salicylate levels were negative as well.  White count was 14.9 upon arrival but this is resolved.  He underwent an LP and CSF was not consistent with a bacterial infection.  HSV PCR was negative as well.  There  is concern about whether patient is taking his medications since he is on benzodiazepines at home and UDS was negative, also Keppra and Topamax levels were undetectably low.  Neurology also felt that his encephalopathy may have represented an extended postictal phase.  He was placed back on Topamax, Keppra as well as clonazepam and has remained seizure-free.  Active problems Elevated CPK -likely due to unwitnessed seizures, improving.  He is tolerating p.o. intake Hypokalemia -Resolved. Low Vit B12 -continue repletion.  Intrinsic factor antibodies were normal Aspiration pneumonia-Left upper lobe infiltrate noted on the CT scan, completed a 5-day course of antibiotics while hospitalized  Scheduled Meds:  clonazePAM  0.5 mg Oral BID   cyanocobalamin  1,000 mcg Subcutaneous Q1200   KEPPRA (BRAND NAME ONLY) 2000 MG = 2 TABS  2,000 mg Oral BID   TOPAMAX (topiramate) BRAND NAME ONLY 200mg /tab-HOME MED  200 mg Oral BID   Continuous Infusions:  0.9 % NaCl with KCl 20 mEq / L 100 mL/hr at 06/25/21 2218   promethazine (PHENERGAN) injection (IM or IVPB)     PRN Meds:.acetaminophen **OR** acetaminophen, ibuprofen, promethazine (PHENERGAN) injection (IM or IVPB)  Diet Orders (From admission, onward)     Start     Ordered   06/24/21 1305  Diet regular Room service appropriate? Yes; Fluid consistency: Thin  Diet effective now       Comments: Pt can not have whole eggs alone, but foods with eggs incorporated/cooked in are ok.  Do not send milk or lactaid, cheese cooked with  foods are ok, though.  Thank you!  Question Answer Comment  Room service appropriate? Yes   Fluid consistency: Thin      06/24/21 1307            DVT prophylaxis: SCDs Start: 06/20/21 0432     Code Status: Full Code  Family Communication: no family at bedside   Status is: Inpatient  Remains inpatient appropriate because: neuro clearance  Level of care: Progressive  Consultants:  Neurology  Procedures:   none  Microbiology  none  Antimicrobials: none    Objective: Vitals:   06/26/21 0027 06/26/21 0438 06/26/21 0815 06/26/21 1155  BP: 107/68 105/70 110/69 112/65  Pulse: (!) 57 66 (!) 57 71  Resp: 16 16 18 18   Temp: 98.2 F (36.8 C) 97.7 F (36.5 C) (!) 97.5 F (36.4 C) 98 F (36.7 C)  TempSrc: Oral Oral Oral Oral  SpO2: 99% 100% 100% 100%  Weight:      Height:        Intake/Output Summary (Last 24 hours) at 06/26/2021 1349 Last data filed at 06/26/2021 0439 Gross per 24 hour  Intake 906.98 ml  Output 2100 ml  Net -1193.02 ml   Filed Weights   06/20/21 0007  Weight: 68 kg    Examination:  Constitutional: NAD Eyes: no scleral icterus ENMT: Mucous membranes are moist.  Neck: normal, supple Respiratory: clear to auscultation bilaterally, no wheezing, no crackles. Normal respiratory effort.  Cardiovascular: Regular rate and rhythm, no murmurs / rubs / gallops. No LE edema. Abdomen: non distended, no tenderness. Bowel sounds positive.  Musculoskeletal: no clubbing / cyanosis.  Skin: no rashes Neurologic: CN 2-12 grossly intact. Strength 5/5 in all 4.   Data Reviewed: I have independently reviewed following labs and imaging studies   CBC: Recent Labs  Lab 06/20/21 0042 06/20/21 0554 06/22/21 0400 06/25/21 1012  WBC 14.9* 12.7* 4.6 4.8  NEUTROABS 13.6* 9.8* 2.1 2.5  HGB 15.4 12.3* 12.4* 13.0  HCT 44.5 35.6* 35.6* 37.0*  MCV 83.8 84.0 83.0 83.9  PLT 210 176 166 992   Basic Metabolic Panel: Recent Labs  Lab 06/22/21 0400 06/22/21 1400 06/23/21 0408 06/24/21 1103 06/25/21 1012 06/26/21 0707  NA 139  --  138 137 138 138  K 3.3*  --  3.2* 3.4* 3.8 3.7  CL 110  --  113* 111 115* 112*  CO2 23  --  19* 19* 16* 19*  GLUCOSE 94  --  90 95 99 91  BUN 10  --  <5* 5* 8 12  CREATININE 1.01  --  0.81 1.05 1.10 1.02  CALCIUM 8.8*  --  8.9 9.3 9.5 9.3  MG  --  1.8  --   --  2.1  --    Liver Function Tests: Recent Labs  Lab 06/20/21 0042 06/20/21 0554   AST 51* 33  ALT 43 28  ALKPHOS 80 62  BILITOT 1.1 0.7  PROT 7.9 6.4*  ALBUMIN 4.8 3.9   Coagulation Profile: No results for input(s): INR, PROTIME in the last 168 hours. HbA1C: No results for input(s): HGBA1C in the last 72 hours. CBG: Recent Labs  Lab 06/20/21 2327 06/21/21 0645 06/21/21 1129 06/21/21 1716 06/22/21 1838  GLUCAP 75 74 85 142* 109*    Recent Results (from the past 240 hour(s))  Resp Panel by RT-PCR (Flu A&B, Covid) Nasopharyngeal Swab     Status: None   Collection Time: 06/20/21  1:19 AM   Specimen: Nasopharyngeal Swab; Nasopharyngeal(NP) swabs in  vial transport medium  Result Value Ref Range Status   SARS Coronavirus 2 by RT PCR NEGATIVE NEGATIVE Final    Comment: (NOTE) SARS-CoV-2 target nucleic acids are NOT DETECTED.  The SARS-CoV-2 RNA is generally detectable in upper respiratory specimens during the acute phase of infection. The lowest concentration of SARS-CoV-2 viral copies this assay can detect is 138 copies/mL. A negative result does not preclude SARS-Cov-2 infection and should not be used as the sole basis for treatment or other patient management decisions. A negative result may occur with  improper specimen collection/handling, submission of specimen other than nasopharyngeal swab, presence of viral mutation(s) within the areas targeted by this assay, and inadequate number of viral copies(<138 copies/mL). A negative result must be combined with clinical observations, patient history, and epidemiological information. The expected result is Negative.  Fact Sheet for Patients:  EntrepreneurPulse.com.au  Fact Sheet for Healthcare Providers:  IncredibleEmployment.be  This test is no t yet approved or cleared by the [place] FDA and  has been authorized for detection and/or diagnosis of SARS-CoV-2 by FDA under an Emergency Use Authorization (EUA). This EUA will remain  in effect (meaning this test  can be used) for the duration of the COVID-19 declaration under Section 564(b)(1) of the Act, 21 U.S.C.section 360bbb-3(b)(1), unless the authorization is terminated  or revoked sooner.       Influenza A by PCR NEGATIVE NEGATIVE Final   Influenza B by PCR NEGATIVE NEGATIVE Final    Comment: (NOTE) The Xpert Xpress SARS-CoV-2/FLU/RSV plus assay is intended as an aid in the diagnosis of influenza from Nasopharyngeal swab specimens and should not be used as a sole basis for treatment. Nasal washings and aspirates are unacceptable for Xpert Xpress SARS-CoV-2/FLU/RSV testing.  Fact Sheet for Patients: EntrepreneurPulse.com.au  Fact Sheet for Healthcare Providers: IncredibleEmployment.be  This test is not yet approved or cleared by the [place] FDA and has been authorized for detection and/or diagnosis of SARS-CoV-2 by FDA under an Emergency Use Authorization (EUA). This EUA will remain in effect (meaning this test can be used) for the duration of the COVID-19 declaration under Section 564(b)(1) of the Act, 21 U.S.C. section 360bbb-3(b)(1), unless the authorization is terminated or revoked.  Performed at [facility], [place] [address], [place], [place] [address]   Urine Culture     Status: None   Collection Time: 06/20/21  2:55 AM   Specimen: Urine, Clean Catch  Result Value Ref Range Status   Specimen Description   Final    URINE, CLEAN CATCH Performed at [facility], [place] [address], [place], [place] [address]    Special Requests   Final    NONE Performed at [facility], [place] [address] [place], [place] [address]    Culture   Final    NO GROWTH Performed at [facility], [place] [address], [place], [place] [address]    Report Status 06/21/2021 FINAL  Final  CSF culture     Status: None   Collection Time: 06/20/21  3:39 AM   Specimen: Back; Cerebrospinal Fluid  Result  Value Ref Range Status   Specimen Description   Final    BACK Performed at [place] [address] [place], [place] [address]    Special Requests   Final    NONE Performed at [facility], [place] [address], [place], [place] [address]    Gram Stain   Final    NO ORGANISMS  SEEN NO WBC SEEN Gram Stain Report Called to,Read Back By and Verified With: RN J PRAY AT [phone] 06/20/21 CRUICKSHANK A Performed at [facility], [place] [address] [place], [place] [address]    Culture   Final    NO GROWTH 3 DAYS Performed at [facility], [place] [address] [place], [place] [address]    Report Status 06/23/2021 FINAL  Final  Gram stain     Status: None   Collection Time: 06/20/21  3:39 AM   Specimen: CSF; Cerebrospinal Fluid  Result Value Ref Range Status   Specimen Description CSF LP  Final   Special Requests NONE  Final   Gram Stain   Final    NO ORGANISMS SEEN NO WBC SEEN Gram Stain Report Called to,Read Back By and Verified With: rn j pray at 0442 06/20/21 cruickshank a Performed at [facility], [place] [address] [place], [place] [address]    Report Status 06/20/2021 FINAL  Final  Culture, blood (routine x 2)     Status: None   Collection Time: 06/20/21  4:01 AM   Specimen: Left Antecubital; Blood  Result Value Ref Range Status   Specimen Description   Final    LEFT ANTECUBITAL Performed at [place] [address], [place], [place] [address]    Special Requests   Final    BOTTLES DRAWN AEROBIC AND ANAEROBIC Blood Culture adequate volume Performed at [place] [address] [place], [place] [address]    Culture   Final    NO GROWTH 5 DAYS Performed at [facility], [place] [address], [place], [place] [address]    Report Status 06/25/2021 FINAL  Final  Culture, blood (routine x 2)     Status: None   Collection Time: 06/20/21  4:01 AM   Specimen:  BLOOD LEFT FOREARM  Result Value Ref Range Status   Specimen Description   Final    BLOOD LEFT FOREARM Performed at [place] [address], [place], [place] [address]    Special Requests   Final    BOTTLES DRAWN AEROBIC AND ANAEROBIC Blood Culture results may not be optimal due to an inadequate volume of blood received in culture bottles Performed at [place] [address] [place], [place] [address]    Culture   Final    NO GROWTH 5 DAYS Performed at [facility], [place] [address] [place],  [address]    Report Status 06/25/2021 FINAL  Final  Respiratory (~20 pathogens) panel by PCR     Status: None   Collection Time: 06/20/21  5:37 AM   Specimen: Nasopharyngeal Swab; Respiratory  Result Value Ref Range Status   Adenovirus NOT DETECTED NOT DETECTED Final   Coronavirus 229E NOT DETECTED NOT DETECTED Final    Comment: (NOTE) The Coronavirus on the Respiratory Panel, DOES NOT test for the novel  Coronavirus (2019 nCoV)    Coronavirus HKU1 NOT DETECTED NOT DETECTED Final   Coronavirus NL63 NOT DETECTED NOT DETECTED Final   Coronavirus OC43 NOT DETECTED NOT DETECTED Final   Metapneumovirus NOT DETECTED NOT DETECTED Final   Rhinovirus / Enterovirus NOT DETECTED NOT DETECTED Final   Influenza A NOT DETECTED NOT DETECTED Final   Influenza B NOT DETECTED NOT DETECTED Final   Parainfluenza Virus 1 NOT DETECTED NOT DETECTED Final   Parainfluenza Virus 2 NOT DETECTED NOT DETECTED Final   Parainfluenza Virus 3 NOT  DETECTED NOT DETECTED Final   Parainfluenza Virus 4 NOT DETECTED NOT DETECTED Final   Respiratory Syncytial Virus NOT DETECTED NOT DETECTED Final   Bordetella pertussis NOT DETECTED NOT DETECTED Final   Bordetella Parapertussis NOT DETECTED NOT DETECTED Final   Chlamydophila pneumoniae NOT DETECTED NOT DETECTED Final   Mycoplasma pneumoniae NOT DETECTED NOT DETECTED Final    Comment: Performed at [place], [place] [address] [place], [place] [address]     Radiology Studies: No results found.   Marzetta Board, MD, PhD [facility]  Between 7 am - 7 pm I am available, please contact me via Amion (for emergencies) or Securechat (non urgent messages)  Between 7 pm - 7 am I am not available, please contact night coverage MD/APP via Amion

## 2021-06-26 NOTE — Progress Notes (Signed)
LTM EEG maint complete - no skin breakdown under:  FP1 Fz Cz P3

## 2021-06-26 NOTE — Progress Notes (Signed)
Physical Therapy Treatment Patient Details Name: Terrence Riley MRN: [id number] DOB: May 31, 2001 Today's Date: 06/26/2021   History of Present Illness Pt is a 20 y/o male admitted secondary to AMS and possible seizures. PMH includes seizures and asthma.    PT Comments    Patient progressing with mobility and able to complete DGI scoring 20/24.  Still slightly off with focus affecting balance/safety.  Will continue to follow until d/c.  Will not need follow up PT at d/c.   Recommendations for follow up therapy are one component of a multi-disciplinary discharge planning process, led by the attending physician.  Recommendations may be updated based on patient status, additional functional criteria and insurance authorization.  Follow Up Recommendations  No PT follow up     Assistance Recommended at Discharge    Equipment Recommendations  None recommended by PT    Recommendations for Other Services       Precautions / Restrictions Precautions Precautions: Fall Restrictions Weight Bearing Restrictions: No     Mobility  Bed Mobility         Supine to sit: Modified independent (Device/Increase time) Sit to supine: Modified independent (Device/Increase time)   General bed mobility comments: slower and heavily reliant on UE support; no lines as RN disconnected    Transfers Overall transfer level: Needs assistance Equipment used: None Transfers: Sit to/from Stand Sit to Stand: Min guard           General transfer comment: assist for safety    Ambulation/Gait Ambulation/Gait assistance: Supervision;Min guard Gait Distance (Feet): 300 Feet Assistive device: None Gait Pattern/deviations: Step-through pattern;Decreased stride length       General Gait Details: ambulated with close S to minguard A for balance during DGI; performed relatively well, but limited slightly to attention to task   Stairs             Wheelchair Mobility    Modified Rankin  (Stroke Patients Only)       Balance Overall balance assessment: Needs assistance   Sitting balance-Leahy Scale: Good       Standing balance-Leahy Scale: Good Standing balance comment: able to stand and perform alternate taps to step without UE support                 Standardized Balance Assessment Standardized Balance Assessment : Dynamic Gait Index   Dynamic Gait Index Level Surface: Mild Impairment Change in Gait Speed: Mild Impairment Gait with Horizontal Head Turns: Normal Gait with Vertical Head Turns: Mild Impairment Gait and Pivot Turn: Normal Step Over Obstacle: Normal Step Around Obstacles: Normal Steps: Mild Impairment Total Score: 20      Cognition Arousal/Alertness: Awake/alert Behavior During Therapy: WFL for tasks assessed/performed Overall Cognitive Status: No family/caregiver present to determine baseline cognitive functioning                                 General Comments: oriented, but mild delay with responses and slightly altered affect        Exercises      General Comments General comments (skin integrity, edema, etc.): Reports seizures since he was 87 mos old.   States just heard from coach at [employer] about going there to play basketball.  Has a birthday in 6 days.      Pertinent Vitals/Pain Pain Assessment: No/denies pain    Home Living  Prior Function            PT Goals (current goals can now be found in the care plan section) Progress towards PT goals: Progressing toward goals    Frequency    Min 3X/week      PT Plan Current plan remains appropriate    Co-evaluation              AM-PAC PT "6 Clicks" Mobility   Outcome Measure  Help needed turning from your back to your side while in a flat bed without using bedrails?: None Help needed moving from lying on your back to sitting on the side of a flat bed without using bedrails?: None Help  needed moving to and from a bed to a chair (including a wheelchair)?: A Little Help needed standing up from a chair using your arms (e.g., wheelchair or bedside chair)?: A Little Help needed to walk in hospital room?: A Little Help needed climbing 3-5 steps with a railing? : A Little 6 Click Score: 20    End of Session Equipment Utilized During Treatment: Gait belt Activity Tolerance: Patient tolerated treatment well Patient left: in bed;with call bell/phone within reach;with bed alarm set   PT Visit Diagnosis: Other symptoms and signs involving the nervous system (L93.570)     Time: 1779-3903 PT Time Calculation (min) (ACUTE ONLY): 20 min  Charges:  $Gait Training: 8-22 mins                     Magda Kiel, PT Acute Rehabilitation Services Pager:[phone] Office:[phone] 06/26/2021    Reginia Naas 06/26/2021, 11:57 AM

## 2021-06-27 DIAGNOSIS — G9340 Encephalopathy, unspecified: Secondary | ICD-10-CM | POA: Diagnosis not present

## 2021-06-27 DIAGNOSIS — E538 Deficiency of other specified B group vitamins: Secondary | ICD-10-CM | POA: Diagnosis not present

## 2021-06-27 DIAGNOSIS — J690 Pneumonitis due to inhalation of food and vomit: Secondary | ICD-10-CM | POA: Diagnosis not present

## 2021-06-27 DIAGNOSIS — G40919 Epilepsy, unspecified, intractable, without status epilepticus: Secondary | ICD-10-CM

## 2021-06-27 LAB — ARBOVIRUS IGG, CSF
California Enceph IgG: <1:1 {titer}
Eastern eq encephalitis, IgG: <1:1 {titer}
St Louis encephalitis, IgG: <1:1 {titer}
West Nile IgG CSF: 0.06 IV (ref <1.30)
Western eq encephalitis, IgG: <1:1 {titer}

## 2021-06-27 MED ORDER — METHOCARBAMOL 500 MG PO TABS: 500.0000 mg | ORAL_TABLET | Freq: Three times a day (TID) | ORAL | 0 refills | Status: AC | PRN

## 2021-06-27 MED ORDER — TOPIRAMATE 200 MG PO TABS: 200.0000 mg | ORAL_TABLET | Freq: Two times a day (BID) | ORAL | 0 refills | Status: DC

## 2021-06-27 MED ORDER — KEPPRA 1000 MG PO TABS: 2000.0000 mg | ORAL_TABLET | Freq: Two times a day (BID) | ORAL | 0 refills | Status: DC

## 2021-06-27 MED ORDER — CYANOCOBALAMIN 1000 MCG/ML IJ SOLN: 1000.0000 ug | INTRAMUSCULAR | 0 refills | Status: DC

## 2021-06-27 MED ORDER — CLONAZEPAM 0.5 MG PO TABS: 0.5000 mg | ORAL_TABLET | Freq: Two times a day (BID) | ORAL | 0 refills | Status: DC

## 2021-06-27 NOTE — Progress Notes (Signed)
Subjective: No acute events overnight.  Patient sitting up in bed having breakfast.  States he is feeling well.  Requesting to go home.  ROS: negative except above  Examination  Vital signs in last 24 hours: Temp:  [97.6 F (36.4 C)-98.4 F (36.9 C)] 98 F (36.7 C) (12/02 0912) Pulse Rate:  [57-85] 85 (12/02 0912) Resp:  [15-18] 18 (12/02 0912) BP: (91-120)/(55-73) 108/64 (12/02 0912) SpO2:  [100 %] 100 % (12/02 0912)  General: lying in bed, not in apparent distress CVS: pulse-normal rate and rhythm RS: breathing comfortably, CTAB Extremities: normal, warm   Neuro: MS: Awake, alert, oriented x3, attention intact, able to do simple arithmetic problems today but still slow to respond : pupils equal and reactive,  EOMI, face symmetric, tongue midline, normal sensation over face, Motor: 5/5 strength in all 4 extremities Reflexes: 2+ bilaterally over patella, biceps, plantars: flexor Coordination: normal Gait: not tested    Basic Metabolic Panel: Recent Labs  Lab 06/22/21 0400 06/22/21 1400 06/23/21 0408 06/24/21 1103 06/25/21 1012 06/26/21 0707  NA 139  --  138 137 138 138  K 3.3*  --  3.2* 3.4* 3.8 3.7  CL 110  --  113* 111 115* 112*  CO2 23  --  19* 19* 16* 19*  GLUCOSE 94  --  90 95 99 91  BUN 10  --  <5* 5* 8 12  CREATININE 1.01  --  0.81 1.05 1.10 1.02  CALCIUM 8.8*  --  8.9 9.3 9.5 9.3  MG  --  1.8  --   --  2.1  --     CBC: Recent Labs  Lab 06/22/21 0400 06/25/21 1012  WBC 4.6 4.8  NEUTROABS 2.1 2.5  HGB 12.4* 13.0  HCT 35.6* 37.0*  MCV 83.0 83.9  PLT 166 184     Coagulation Studies: No results for input(s): LABPROT, INR in the last 72 hours.  Imaging No new brain imaging overnight   ASSESSMENT AND PLAN: 20 year old male with history of epilepsy who presented with altered mental status.  His CK was elevated and AED levels subtherapeutic.   Epilepsy with suspected breakthrough seizure Rhabdomyolysis Headache -Patient came in with altered  mental status, urinary incontinence, lip bite. He had subtherapeutic AED levels, leucocytosis as well as elevated CK. He tells me he was taking his medications but I do not think he was compliant because of his subtherapeutic medication levels as well as absence of benzodiazepines on urine drug screen. -Most likely patient had breakthrough seizures due to medication noncompliance.   Recommendations -Continue Keppra 2000 mg twice daily and topiramate 200 mg twice daily -Ideally, I would not have reduced antiepileptic doses when patient presents with a seizure.  However, because this patient is noncompliant and has had prolonged encephalopathy after seizure, I reduced clonazepam 0.5 mg twice daily to minimize sedation -Discussed importance of medication compliance, seizure provoking factors as well as seizure precautions including do not drive the patient at bedside -Called mom and discussed my plan again today.  She states she is concerned her son might have leukemia and requesting testing for leukemia.  I told her I doubt he has that.  However, I am not a hematologist.  Therefore will let medicine team now. -Recommend close follow-up with neurology in 3 to 4 weeks   Seizure precautions: Per [facility] statutes, patients with seizures are not allowed to drive until they have been seizure-free for six months and cleared by a physician    Use  caution when using heavy equipment or power tools. Avoid working on ladders or at heights. Take showers instead of baths. Ensure the water temperature is not too high on the home water heater. Do not go swimming alone. Do not lock yourself in a room alone (i.e. bathroom). When caring for infants or small children, sit down when holding, feeding, or changing them to minimize risk of injury to the child in the event you have a seizure. Maintain good sleep hygiene. Avoid alcohol.    If patient has another seizure, call 911 and bring them back to the ED if: A.   The seizure lasts longer than 5 minutes.      B.  The patient doesn't wake shortly after the seizure or has new problems such as difficulty seeing, speaking or moving following the seizure C.  The patient was injured during the seizure D.  The patient has a temperature over 102 F (39C) E.  The patient vomited during the seizure and now is having trouble breathing    During the Seizure   - First, ensure adequate ventilation and place patients on the floor on their left side  Loosen clothing around the neck and ensure the airway is patent. If the patient is clenching the teeth, do not force the mouth open with any object as this can cause severe damage - Remove all items from the surrounding that can be hazardous. The patient may be oblivious to what's happening and may not even know what he or she is doing. If the patient is confused and wandering, either gently guide him/her away and block access to outside areas - Reassure the individual and be comforting - Call 911. In most cases, the seizure ends before EMS arrives. However, there are cases when seizures may last over 3 to 5 minutes. Or the individual may have developed breathing difficulties or severe injuries. If a pregnant patient or a person with diabetes develops a seizure, it is prudent to call an ambulance. - Finally, if the patient does not regain full consciousness, then call EMS. Most patients will remain confused for about 45 to 90 minutes after a seizure, so you must use judgment in calling for help. - Avoid restraints but make sure the patient is in a bed with padded side rails - Place the individual in a lateral position with the neck slightly flexed; this will help the saliva drain from the mouth and prevent the tongue from falling backward - Remove all nearby furniture and other hazards from the area - Provide verbal assurance as the individual is regaining consciousness - Provide the patient with privacy if possible - Call  for help and start treatment as ordered by the caregiver    After the Seizure (Postictal Stage)   After a seizure, most patients experience confusion, fatigue, muscle pain and/or a headache. Thus, one should permit the individual to sleep. For the next few days, reassurance is essential. Being calm and helping reorient the person is also of importance.   Most seizures are painless and end spontaneously. Seizures are not harmful to others but can lead to complications such as stress on the lungs, brain and the heart. Individuals with prior lung problems may develop labored breathing and respiratory distress.     I have spent a total of  26  minutes with the patient reviewing hospital notes,  test results, labs and examining the patient as well as establishing an assessment and plan that was discussed personally with the patient  and mother.  > 50% of time was spent in direct patient care.  Zeb Comfort Epilepsy [facility] For questions after 5pm please refer to AMION to reach the Neurologist on call

## 2021-06-27 NOTE — Progress Notes (Signed)
Physical Therapy Treatment Patient Details Name: Terrence Riley MRN: [id number] DOB: [birth year] Today's Date: 06/27/2021   History of Present Illness Pt is a 20 y/o male admitted secondary to AMS and possible seizures. PMH includes seizures and asthma.    PT Comments    Patient with continued progress with mobility/balance and cognition.  Able to perform wayfinding task with questioning cues.  Patient also appropriate with answering questions today.  Agree with outpatient OT for functional cognitive re-training.  PT signing off as planned d/c home today.   Recommendations for follow up therapy are one component of a multi-disciplinary discharge planning process, led by the attending physician.  Recommendations may be updated based on patient status, additional functional criteria and insurance authorization.  Follow Up Recommendations  No PT follow up     Assistance Recommended at Discharge Intermittent Supervision/Assistance  Equipment Recommendations  None recommended by PT    Recommendations for Other Services       Precautions / Restrictions Precautions Precautions: None Restrictions Weight Bearing Restrictions: No     Mobility  Bed Mobility Overal bed mobility: Independent                  Transfers Overall transfer level: Independent                      Ambulation/Gait Ambulation/Gait assistance: Independent Gait Distance (Feet): 400 Feet Assistive device: None Gait Pattern/deviations: WFL(Within Functional Limits)       General Gait Details: PT pushing IV pole   Stairs             Wheelchair Mobility    Modified Rankin (Stroke Patients Only)       Balance Overall balance assessment: Independent   Sitting balance-Leahy Scale: Normal       Standing balance-Leahy Scale: Good                              Cognition Arousal/Alertness: Awake/alert Behavior During Therapy: WFL for tasks  assessed/performed Overall Cognitive Status: No family/caregiver present to determine baseline cognitive functioning                                 General Comments: appropriate answers to questions today, still somewhat eurphoric in affect and quiet, though appropriate answers to questions today and found chapel and his way back to his room with questioning cues.        Exercises      General Comments        Pertinent Vitals/Pain Faces Pain Scale: Hurts a little bit Pain Location: mild headache Pain Descriptors / Indicators: Headache Pain Intervention(s): Monitored during session    Home Living                          Prior Function            PT Goals (current goals can now be found in the care plan section) Progress towards PT goals: Progressing toward goals    Frequency    Min 3X/week      PT Plan Current plan remains appropriate    Co-evaluation              AM-PAC PT "6 Clicks" Mobility   Outcome Measure  Help needed turning from your back to your side while in a flat bed  without using bedrails?: None Help needed moving from lying on your back to sitting on the side of a flat bed without using bedrails?: None Help needed moving to and from a bed to a chair (including a wheelchair)?: None Help needed standing up from a chair using your arms (e.g., wheelchair or bedside chair)?: None Help needed to walk in hospital room?: None Help needed climbing 3-5 steps with a railing? : None 6 Click Score: 24    End of Session Equipment Utilized During Treatment: Gait belt Activity Tolerance: Patient tolerated treatment well Patient left: in bed;with call bell/phone within reach;with bed alarm set   PT Visit Diagnosis: Other symptoms and signs involving the nervous system (R29.898)     Time: 1130-1145 PT Time Calculation (min) (ACUTE ONLY): 15 min  Charges:  $Therapeutic Activity: 8-22 mins                     Magda Kiel,  PT Acute Rehabilitation Services Pager:[phone] Office:[phone] 06/27/2021    Reginia Naas 06/27/2021, 12:54 PM

## 2021-06-27 NOTE — Consult Note (Signed)
   [facility] CM Inpatient Consult   06/27/2021  TEAGUE GOYNES 2001/03/22 [id number]   [facility] [ACO] Patient: Terrence Riley plan  Primary Care Provider:  Eulas Post, MD, [facility]  Came by and patient with MD. Patient will receive follow up for post hospital support.  Plan:  Patient has been currently assigned to an [facility] for telephonic follow up for support and service.       For additional questions or referrals please contact:   Natividad Brood, RN BSN [facility] Liaison  [phone] business mobile phone Toll free office [phone]  Fax number: [phone] [email or web address]

## 2021-06-27 NOTE — TOC Transition Note (Signed)
Transition of Care [facility]) - CM/SW Discharge Note   Patient Details  Name: Terrence Riley MRN: [id number] Date of Birth: 2000-11-04  Transition of Care [facility] CM/SW Contact:  Pollie Friar, RN Phone Number: 06/27/2021, 12:36 PM   Clinical Narrative:    Patient is discharging home with outpatient OT follow up. CM met with the patient and he was agreeable to attend [facility]. Orders in Epic and information on the AVS. Pt has transportation home.    Final next level of care: OP Rehab Barriers to Discharge: No Barriers Identified   Patient Goals and CMS Choice Patient states their goals for this hospitalization and ongoing recovery are:: to go home CMS Medicare.gov Compare Post Acute Care list provided to:: Patient Choice offered to / list presented to : Patient  Discharge Placement                       Discharge Plan and Services   Discharge Planning Services: CM Consult                                 Social Determinants of Health (SDOH) Interventions     Readmission Risk Interventions No flowsheet data found.

## 2021-06-27 NOTE — Discharge Summary (Addendum)
Physician Discharge Summary  Terrence Riley OZH:[id number] DOB: 2001-04-14 DOA: 06/20/2021  PCP: Eulas Post, MD  Admit date: 06/20/2021 Discharge date: 06/27/2021  Admitted From: home Disposition:  home  Recommendations for Outpatient Follow-up:  Follow up with PCP in 1-2 weeks  Home Health: none Equipment/Devices: none  Discharge Condition: stable CODE STATUS: Full code Diet recommendation: regular  HPI: Per admitting MD, Terrence Riley is a 20 y.o. male with history of seizures who had a right shoulder surgery last month had follow-up with surgeon last week was found to be acutely confused last night around 10 PM.  Prior to this patient mother saw the patient napping around 6 PM.  When patient mother saw the patient initially was confused and naked standing in the kitchen and also had urinated on the floor.  His lips look swollen.  Per patient's mother patient stated that he may have taken some of his medicine too much but he was not pointing to which one.  He appeared confused and he was brought to the ER.  Prior to coming to the ER patient did throw up twice as per the patient's mother.  Hospital Course / Discharge diagnoses: Principal Problem: Acute encephalopathy-fever, leukocytosis/seizure disorder -patient was admitted to the hospital with acute encephalopathy.  Due to concern for seizures neurology was consulted and followed patient while hospitalized.  Urine drug screen was negative, alcohol and salicylate levels were negative as well.  White count was 14.9 upon arrival but this is resolved.  He underwent an LP and CSF was not consistent with a bacterial infection.  HSV PCR was negative as well.  There is concern about whether patient is taking his medications since he is on benzodiazepines at home and UDS was negative, also Keppra and Topamax levels were undetectably low.  Neurology also felt that his encephalopathy may have represented an extended postictal phase.   He was placed back on Topamax, Keppra as well as clonazepam and has remained seizure-free.  He is now ambulatory, ambulated 300 feet with physical therapy, and will be discharged home in stable condition.  He is to resume his home antiepileptic regimen as below.  All medications were refilled for a month at the time of discharge   Active problems Elevated CPK -likely due to unwitnessed seizures, improving.  He is tolerating p.o. intake Hypokalemia -Resolved. 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  Intrinsic factor antibodies were normal initially but then they were rechecked and they were elevated.  Continue IM repletion as an outpatient with monthly injections and recheck B12 level in PCPs office in 1 to 2 months Aspiration pneumonia-Left upper lobe infiltrate noted on the CT scan, completed a 5-day course of antibiotics while hospitalized  Sepsis ruled out   Discharge Instructions  Discharge Instructions     Ambulatory referral to Occupational Therapy   Complete by: As directed       Allergies as of 06/27/2021       Reactions   Lortab [hydrocodone-acetaminophen] Hives   Other Anaphylaxis   Peanuts   Zofran [ondansetron Hcl] Nausea And Vomiting   Citrus Other (See Comments)   Sores in mouth   Dairy Aid [tilactase] Nausea And Vomiting   Influenza Virus Vaccine Other (See Comments)   Partial paralysis for a couple of weeks per mom   Soy Allergy Other (See Comments)   Seizures   Hydrocodone-acetaminophen Rash        Medication List     STOP taking these medications  oxyCODONE 5 MG immediate release tablet Commonly known as: Roxicodone       TAKE these medications    cetirizine 5 MG tablet Commonly known as: ZYRTEC Take 5 mg by mouth at bedtime.   clonazePAM 0.5 MG tablet Commonly known as: KLONOPIN Take 1 tablet (0.5 mg total) by mouth 2 (two) times daily. What changed:  medication strength how much to take   cyanocobalamin 1000  MCG/ML injection Commonly known as: (VITAMIN B-12) Inject 1 mL (1,000 mcg total) into the muscle every 30 (thirty) days.   EPINEPHrine 0.3 mg/0.3 mL Soaj injection Commonly known as: EPI-PEN Inject 0.3 mg into the muscle as needed for anaphylaxis.   Keppra 1000 MG tablet Generic drug: levETIRAcetam Take 2 tablets (2,000 mg total) by mouth 2 (two) times daily.   lidocaine 5 % Commonly known as: Lidoderm Place 1 patch onto the skin daily. Remove & Discard patch within 12 hours or as directed by MD What changed:  when to take this reasons to take this   methocarbamol 500 MG tablet Commonly known as: Robaxin Take 1 tablet (500 mg total) by mouth every 8 (eight) hours as needed for up to 10 days for muscle spasms.   promethazine 25 MG tablet Commonly known as: PHENERGAN Take 1 tablet (25 mg total) by mouth every 8 (eight) hours as needed for nausea or vomiting.   topiramate 200 MG tablet Commonly known as: Topamax Take 1 tablet (200 mg total) by mouth 2 (two) times daily.        Follow-up Information     Cameron Sprang, MD Follow up in 2 week(s).   Specialty: Neurology Contact information: [place] [address] [phone]         [place]. Schedule an appointment as soon as possible for a visit in 1 week(s).   Specialty: Rehabilitation Contact information: [address] [place] [id number] [place] [address] [phone]                Consultations: Neurology   Procedures/Studies:  DG Chest 2 View  Result Date: 06/10/2021 CLINICAL DATA:  Of breath and chest pain for 1 day EXAM: CHEST - 2 VIEW COMPARISON:  04/10/2021 FINDINGS: The heart size and mediastinal contours are within normal limits. Both lungs are clear. The visualized skeletal structures are unremarkable. IMPRESSION: No active cardiopulmonary disease. Electronically Signed   By: Inez Catalina M.D.    On: 06/10/2021 03:26   CT Head Wo Contrast  Addendum Date: 06/20/2021   ADDENDUM REPORT: 06/20/2021 02:48 ADDENDUM: Basilar cisterns are patent. No mass effect. Images are obliqued on CT axial. These results were called by telephone at the time of interpretation on 06/20/2021 at 2:44 am to provider Dr. Christy Gentles, who verbally acknowledged these results. Electronically Signed   By: Iven Finn M.D.   On: 06/20/2021 02:48   Result Date: 06/20/2021 CLINICAL DATA:  Mental status change. Unknown cause. Overdose of unknown substance suspected. EXAM: CT HEAD WITHOUT CONTRAST TECHNIQUE: Contiguous axial images were obtained from the base of the skull through the vertex without intravenous contrast. COMPARISON:  None. FINDINGS: Brain: No evidence of large-territorial acute infarction. No parenchymal hemorrhage. No mass lesion. No extra-axial collection. No mass effect or midline shift. No hydrocephalus. Basilar cisterns are patent. Vascular: No hyperdense vessel. Skull: No acute fracture or focal lesion. Sinuses/Orbits: Paranasal sinuses and mastoid air cells are clear. The orbits are unremarkable. Other: None. IMPRESSION: Negative for acute traumatic injury.  Electronically Signed: By: Iven Finn M.D. On: 06/20/2021 01:21   CT CHEST WO CONTRAST  Result Date: 06/20/2021 CLINICAL DATA:  Persistent cough with fever and nausea/vomiting. Blood in the urine and leukocytosis. EXAM: CT CHEST, ABDOMEN AND PELVIS WITHOUT CONTRAST TECHNIQUE: Multidetector CT imaging of the chest, abdomen and pelvis was performed following the standard protocol without IV contrast. COMPARISON:  None. FINDINGS: CT CHEST FINDINGS Cardiovascular: No significant vascular findings. Normal heart size. No pericardial effusion. Mediastinum/Nodes: No adenopathy or visible inflammation Lungs/Pleura: Patchy ground-glass opacity in the left upper lobe, primarily lingular. Although peripheral, no confluent wedge-shaped to imply vascular cause.  No pulmonary edema or pleural fluid. Musculoskeletal: No acute finding CT ABDOMEN PELVIS FINDINGS Limited by lack of intra-abdominal fat hand low-dose noncontrast technique. Hepatobiliary: No focal liver abnormality.No evidence of biliary obstruction or stone. Pancreas: Unremarkable. Spleen: Unremarkable. Adrenals/Urinary Tract: Negative adrenals. No hydronephrosis or stone. Unremarkable bladder. Stomach/Bowel: Indistinct bowel and mesenteric fat in the right abdomen is likely from streak artifact from adjacent EKG pad. No convincing bowel wall thickening. No pericecal inflammatory changes. Moderate stool volume. Vascular/Lymphatic: No acute vascular abnormality. No mass or adenopathy. Reproductive:Negative Other: No ascites or pneumoperitoneum. Musculoskeletal: No acute finding. Spinal alignment which may be related to patient positioning. IMPRESSION: 1. Left upper lobe pneumonia. 2. No hydronephrosis or nephrolithiasis. Electronically Signed   By: Jorje Guild M.D.   On: 06/20/2021 07:12   MR BRAIN W WO CONTRAST  Result Date: 06/23/2021 CLINICAL DATA:  Delirium EXAM: MRI HEAD WITHOUT AND WITH CONTRAST TECHNIQUE: Multiplanar, multiecho pulse sequences of the brain and surrounding structures were obtained without and with intravenous contrast. CONTRAST:  7.10mL GADAVIST GADOBUTROL 1 MMOL/ML IV SOLN COMPARISON:  None. FINDINGS: Brain: No acute infarct, mass effect or extra-axial collection. No acute or chronic hemorrhage. Normal white matter signal, parenchymal volume and CSF spaces. The midline structures are normal. There is no abnormal contrast enhancement. Vascular: Major flow voids are preserved. Skull and upper cervical spine: Normal calvarium and skull base. Visualized upper cervical spine and soft tissues are normal. Sinuses/Orbits:No paranasal sinus fluid levels or advanced mucosal thickening. No mastoid or middle ear effusion. Normal orbits. IMPRESSION: Normal brain MRI. Electronically Signed   By:  Ulyses Jarred M.D.   On: 06/23/2021 20:15   DG Chest Port 1 View  Result Date: 06/20/2021 CLINICAL DATA:  Rule out aspiration.  Altered mental status. EXAM: PORTABLE CHEST 1 VIEW COMPARISON:  06/10/2021 FINDINGS: The heart size and mediastinal contours are within normal limits. No consolidation, effusion, or pneumothorax. No acute osseous abnormality. IMPRESSION: No acute cardiopulmonary process. Electronically Signed   By: Brett Fairy M.D.   On: 06/20/2021 00:59   EEG adult  Result Date: 06/24/2021 Lora Havens, MD     06/24/2021  5:57 PM Patient Name: JAXDEN BLYDEN MRN: [id number] Epilepsy Attending: Lora Havens Referring Physician/Provider: Dr Kerney Elbe Date: 06/24/2021 Duration: 24.06 mins Patient history: 20 year old male who with history of epilepsy who presented with altered mental status.  EEG to evaluate for seizure.  Level of alertness: Awake  AEDs during EEG study: Keppra, Vimpat  Technical aspects: This EEG study was done with scalp electrodes positioned according to the 10-20 International system of electrode placement. Electrical activity was acquired at a sampling rate of 500Hz  and reviewed with a high frequency filter of 70Hz  and a low frequency filter of 1Hz . EEG data were recorded continuously and digitally stored.  Description: The posterior dominant rhythm consists of 9-10 Hz activity of moderate voltage (  25-35 uV) seen predominantly in posterior head regions, symmetric and reactive to eye opening and eye closing.  EEG showed intermittent generalized 3 to 6 Hz theta-delta slowing.  There is also an excessive amount of 15 to 18 Hz beta activity with irregular morphology distributed symmetrically and diffusely. Hyperventilation and photic stimulation were not performed.    ABNORMALITY - Intermittent slow, generalized - Excessive beta, generalized  IMPRESSION: This study is suggestive of mild diffuse encephalopathy, nonspecific to etiology. The excessive beta activity seen  in the background is most likely due to the effect of benzodiazepine and is a benign EEG pattern. No seizures or epileptiform discharges were seen throughout the recording. EEG appears similar to previous day. Lora Havens   EEG adult  Result Date: 06/20/2021 Lora Havens, MD     06/20/2021 12:41 PM Patient Name: EITAN DOUBLEDAY MRN: [id number] Epilepsy Attending: Lora Havens Referring Physician/Provider: Dr Gean Birchwood Date: 06/20/2021 Duration: 23.09 mins Patient history: 20 year old male who with history of epilepsy who presented with altered mental status.  EEG to evaluate for seizure. Level of alertness: Awake AEDs during EEG study: Keppra, Vimpat Technical aspects: This EEG study was done with scalp electrodes positioned according to the 10-20 International system of electrode placement. Electrical activity was acquired at a sampling rate of 500Hz  and reviewed with a high frequency filter of 70Hz  and a low frequency filter of 1Hz . EEG data were recorded continuously and digitally stored. Description: The posterior dominant rhythm consists of 9-10 Hz activity of moderate voltage (25-35 uV) seen predominantly in posterior head regions, symmetric and reactive to eye opening and eye closing.  EEG showed intermittent generalized 3 to 6 Hz theta-delta slowing.  There is also an excessive amount of 15 to 18 Hz beta activity with irregular morphology distributed symmetrically and diffusely. Hyperventilation and photic stimulation were not performed.   ABNORMALITY - Intermittent slow, generalized - Excessive beta, generalized IMPRESSION: This study is suggestive of mild diffuse encephalopathy, nonspecific to etiology. The excessive beta activity seen in the background is most likely due to the effect of benzodiazepine and is a benign EEG pattern. No seizures or epileptiform discharges were seen throughout the recording. Priyanka [name]   Overnight EEG with video  Result Date:  06/25/2021 Lora Havens, MD     06/26/2021  9:08 AM Patient Name: CURLEY HOGEN MRN: [id number] Epilepsy Attending: Lora Havens Referring Physician/Provider: Dr Debbe Odea Duration: 06/24/2021 2007 to 06/25/2021 2007  Patient history: 20 year old male who with history of epilepsy who presented with altered mental status.  EEG to evaluate for seizure.  Level of alertness: Awake, asleep  AEDs during EEG study: Keppra, TPM, Klonopin Technical aspects: This EEG study was done with scalp electrodes positioned according to the 10-20 International system of electrode placement. Electrical activity was acquired at a sampling rate of 500Hz  and reviewed with a high frequency filter of 70Hz  and a low frequency filter of 1Hz . EEG data were recorded continuously and digitally stored.  Description: The posterior dominant rhythm consists of 9-10 Hz activity of moderate voltage (25-35 uV) seen predominantly in posterior head regions, symmetric and reactive to eye opening and eye closing.  Sleep was characterized by vertex waves, sleep spindles (12 to 14 Hz), maximum frontocentral region.  EEG showed intermittent generalized 3 to 6 Hz theta-delta slowing.  There is also an excessive amount of 15 to 18 Hz beta activity with irregular morphology distributed symmetrically and diffusely. Hyperventilation and photic stimulation were not performed.  ABNORMALITY - Intermittent slow, generalized - Excessive beta, generalized  IMPRESSION: This study is suggestive of mild diffuse encephalopathy, nonspecific to etiology. The excessive beta activity seen in the background is most likely due to the effect of benzodiazepine and is a benign EEG pattern. No seizures or epileptiform discharges were seen throughout the recording.  Priyanka [name]   CT RENAL STONE STUDY  Result Date: 06/20/2021 CLINICAL DATA:  Persistent cough with fever and nausea/vomiting. Blood in the urine and leukocytosis. EXAM: CT CHEST, ABDOMEN AND PELVIS  WITHOUT CONTRAST TECHNIQUE: Multidetector CT imaging of the chest, abdomen and pelvis was performed following the standard protocol without IV contrast. COMPARISON:  None. FINDINGS: CT CHEST FINDINGS Cardiovascular: No significant vascular findings. Normal heart size. No pericardial effusion. Mediastinum/Nodes: No adenopathy or visible inflammation Lungs/Pleura: Patchy ground-glass opacity in the left upper lobe, primarily lingular. Although peripheral, no confluent wedge-shaped to imply vascular cause. No pulmonary edema or pleural fluid. Musculoskeletal: No acute finding CT ABDOMEN PELVIS FINDINGS Limited by lack of intra-abdominal fat hand low-dose noncontrast technique. Hepatobiliary: No focal liver abnormality.No evidence of biliary obstruction or stone. Pancreas: Unremarkable. Spleen: Unremarkable. Adrenals/Urinary Tract: Negative adrenals. No hydronephrosis or stone. Unremarkable bladder. Stomach/Bowel: Indistinct bowel and mesenteric fat in the right abdomen is likely from streak artifact from adjacent EKG pad. No convincing bowel wall thickening. No pericecal inflammatory changes. Moderate stool volume. Vascular/Lymphatic: No acute vascular abnormality. No mass or adenopathy. Reproductive:Negative Other: No ascites or pneumoperitoneum. Musculoskeletal: No acute finding. Spinal alignment which may be related to patient positioning. IMPRESSION: 1. Left upper lobe pneumonia. 2. No hydronephrosis or nephrolithiasis. Electronically Signed   By: Jorje Guild M.D.   On: 06/20/2021 07:12     Subjective: - no chest pain, shortness of breath, no abdominal pain, nausea or vomiting.   Discharge Exam: BP 108/64   Pulse 85   Temp 98 F (36.7 C) (Oral)   Resp 18   Ht 6' (1.829 m)   Wt 68 kg   SpO2 100%   BMI 20.34 kg/m   General: Pt is alert, awake, not in acute distress Cardiovascular: RRR, S1/S2 +, no rubs, no gallops Respiratory: CTA bilaterally, no wheezing, no rhonchi Abdominal: Soft, NT, ND,  bowel sounds + Extremities: no edema, no cyanosis    The results of significant diagnostics from this hospitalization (including imaging, microbiology, ancillary and laboratory) are listed below for reference.     Microbiology: Recent Results (from the past 240 hour(s))  Resp Panel by RT-PCR (Flu A&B, Covid) Nasopharyngeal Swab     Status: None   Collection Time: 06/20/21  1:19 AM   Specimen: Nasopharyngeal Swab; Nasopharyngeal(NP) swabs in vial transport medium  Result Value Ref Range Status   SARS Coronavirus 2 by RT PCR NEGATIVE NEGATIVE Final    Comment: (NOTE) SARS-CoV-2 target nucleic acids are NOT DETECTED.  The SARS-CoV-2 RNA is generally detectable in upper respiratory specimens during the acute phase of infection. The lowest concentration of SARS-CoV-2 viral copies this assay can detect is 138 copies/mL. A negative result does not preclude SARS-Cov-2 infection and should not be used as the sole basis for treatment or other patient management decisions. A negative result may occur with  improper specimen collection/handling, submission of specimen other than nasopharyngeal swab, presence of viral mutation(s) within the areas targeted by this assay, and inadequate number of viral copies(<138 copies/mL). A negative result must be combined with clinical observations, patient history, and epidemiological information. The expected result is Negative.  Fact Sheet for Patients:  EntrepreneurPulse.com.au  Fact Sheet for Healthcare Providers:  IncredibleEmployment.be  This test is no t yet approved or cleared by the [place] FDA and  has been authorized for detection and/or diagnosis of SARS-CoV-2 by FDA under an Emergency Use Authorization (EUA). This EUA will remain  in effect (meaning this test can be used) for the duration of the COVID-19 declaration under Section 564(b)(1) of the Act, 21 U.S.C.section 360bbb-3(b)(1), unless the  authorization is terminated  or revoked sooner.       Influenza A by PCR NEGATIVE NEGATIVE Final   Influenza B by PCR NEGATIVE NEGATIVE Final    Comment: (NOTE) The Xpert Xpress SARS-CoV-2/FLU/RSV plus assay is intended as an aid in the diagnosis of influenza from Nasopharyngeal swab specimens and should not be used as a sole basis for treatment. Nasal washings and aspirates are unacceptable for Xpert Xpress SARS-CoV-2/FLU/RSV testing.  Fact Sheet for Patients: EntrepreneurPulse.com.au  Fact Sheet for Healthcare Providers: IncredibleEmployment.be  This test is not yet approved or cleared by the [place] FDA and has been authorized for detection and/or diagnosis of SARS-CoV-2 by FDA under an Emergency Use Authorization (EUA). This EUA will remain in effect (meaning this test can be used) for the duration of the COVID-19 declaration under Section 564(b)(1) of the Act, 21 U.S.C. section 360bbb-3(b)(1), unless the authorization is terminated or revoked.  Performed at [facility], [place] [address] [place], [place] [address]   Urine Culture     Status: None   Collection Time: 06/20/21  2:55 AM   Specimen: Urine, Clean Catch  Result Value Ref Range Status   Specimen Description   Final    URINE, CLEAN CATCH Performed at [facility], [place] [address], [place], [place] [address]    Special Requests   Final    NONE Performed at [facility], [place] [address] [place], [place] [address]    Culture   Final    NO GROWTH Performed at [facility], [place] [address], [place], [place] [address]    Report Status 06/21/2021 FINAL  Final  CSF culture     Status: None   Collection Time: 06/20/21  3:39 AM   Specimen: Back; Cerebrospinal Fluid  Result Value Ref Range Status   Specimen Description   Final    BACK Performed at [place] [address],  [place], [place] [address]    Special Requests   Final    NONE Performed at [facility], [place] [address] [place], [place] [address]    Gram Stain   Final    NO ORGANISMS SEEN NO WBC SEEN Gram Stain Report Called to,Read Back By and Verified With: RN J PRAY AT [phone] 06/20/21 CRUICKSHANK A Performed at [facility], [place] [address] [place], [place] [address]    Culture   Final    NO GROWTH 3 DAYS Performed at [facility], [place] [address], [place],  [address]    Report Status 06/23/2021 FINAL  Final  Gram stain     Status: None   Collection Time: 06/20/21  3:39 AM   Specimen: CSF; Cerebrospinal Fluid  Result Value Ref Range Status   Specimen Description CSF LP  Final   Special Requests NONE  Final   Gram Stain   Final    NO ORGANISMS SEEN NO WBC SEEN Gram Stain Report Called to,Read Back By and Verified With: rn j pray at [phone] 06/20/21  cruickshank a Performed at [facility], [place] [address], [place], [place] [address]    Report Status 06/20/2021 FINAL  Final  Culture, blood (routine x 2)     Status: None   Collection Time: 06/20/21  4:01 AM   Specimen: Left Antecubital; Blood  Result Value Ref Range Status   Specimen Description   Final    LEFT ANTECUBITAL Performed at [place] [address], [place], [place] [address]    Special Requests   Final    BOTTLES DRAWN AEROBIC AND ANAEROBIC Blood Culture adequate volume Performed at [place] [address], [place], [place] [address]    Culture   Final    NO GROWTH 5 DAYS Performed at [facility], [place] [address], [place], [place] [address]    Report Status 06/25/2021 FINAL  Final  Culture, blood (routine x 2)     Status: None   Collection Time: 06/20/21  4:01 AM   Specimen: BLOOD LEFT FOREARM  Result Value Ref Range Status   Specimen Description   Final    BLOOD LEFT FOREARM Performed at [place] [address], [place], [place] [address]    Special Requests   Final    BOTTLES DRAWN AEROBIC AND ANAEROBIC Blood Culture results may not be optimal due to an inadequate volume of blood received in culture bottles Performed at [place] [address], [place], [place] [address]    Culture   Final    NO GROWTH 5 DAYS Performed at [facility], [place] [address] [place], [place] [address]    Report Status 06/25/2021 FINAL  Final  Respiratory (~20 pathogens) panel by PCR     Status: None   Collection Time: 06/20/21  5:37 AM   Specimen: Nasopharyngeal Swab; Respiratory  Result Value Ref Range Status   Adenovirus NOT DETECTED NOT DETECTED Final   Coronavirus 229E NOT DETECTED NOT DETECTED Final    Comment: (NOTE) The Coronavirus on the Respiratory Panel, DOES NOT test for the novel  Coronavirus (2019 nCoV)    Coronavirus HKU1 NOT DETECTED NOT DETECTED Final   Coronavirus NL63 NOT DETECTED NOT DETECTED Final   Coronavirus OC43 NOT DETECTED NOT DETECTED Final   Metapneumovirus NOT DETECTED NOT DETECTED Final   Rhinovirus / Enterovirus NOT DETECTED NOT DETECTED Final   Influenza A NOT DETECTED NOT DETECTED Final   Influenza B NOT DETECTED NOT DETECTED Final   Parainfluenza Virus 1 NOT DETECTED NOT DETECTED Final   Parainfluenza Virus 2 NOT DETECTED NOT DETECTED Final   Parainfluenza Virus 3 NOT DETECTED NOT DETECTED Final   Parainfluenza Virus 4 NOT DETECTED NOT DETECTED Final   Respiratory Syncytial Virus NOT DETECTED NOT DETECTED Final   Bordetella pertussis NOT DETECTED NOT DETECTED Final   Bordetella Parapertussis NOT DETECTED NOT DETECTED Final   Chlamydophila pneumoniae NOT DETECTED NOT DETECTED Final   Mycoplasma pneumoniae NOT DETECTED NOT DETECTED Final    Comment: Performed at [facility], [place]. [address] [place], [place] [address]     Labs: Basic Metabolic Panel: Recent Labs  Lab 06/22/21 0400 06/22/21 1400  06/23/21 0408 06/24/21 1103 06/25/21 1012 06/26/21 0707  NA 139  --  138 137 138 138  K 3.3*  --  3.2* 3.4* 3.8 3.7  CL 110  --  113* 111 115* 112*  CO2 23  --  19* 19* 16* 19*  GLUCOSE 94  --  90 95 99 91  BUN 10  --  <5* 5* 8 12  CREATININE 1.01  --  0.81 1.05 1.10 1.02  CALCIUM 8.8*  --  8.9 9.3 9.5 9.3  MG  --  1.8  --   --  2.1  --    Liver Function Tests: No results for input(s): AST, ALT, ALKPHOS, BILITOT, PROT, ALBUMIN in the last 168 hours. CBC: Recent Labs  Lab 06/22/21 0400 06/25/21 1012  WBC 4.6 4.8  NEUTROABS 2.1 2.5  HGB 12.4* 13.0  HCT 35.6* 37.0*  MCV 83.0 83.9  PLT 166 184   CBG: Recent Labs  Lab 06/20/21 2327 06/21/21 0645 06/21/21 1129 06/21/21 1716 06/22/21 1838  GLUCAP 75 74 85 142* 109*   Hgb A1c No results for input(s): HGBA1C in the last 72 hours. Lipid Profile No results for input(s): CHOL, HDL, LDLCALC, TRIG, CHOLHDL, LDLDIRECT in the last 72 hours. Thyroid function studies No results for input(s): TSH, T4TOTAL, T3FREE, THYROIDAB in the last 72 hours.  Invalid input(s): FREET3 Urinalysis    Component Value Date/Time   COLORURINE STRAW (A) 06/22/2021 1432   APPEARANCEUR CLEAR 06/22/2021 1432   APPEARANCEUR Clear 08/23/2013 1127   LABSPEC 1.008 06/22/2021 1432   LABSPEC 1.017 08/23/2013 1127   PHURINE 8.0 06/22/2021 1432   GLUCOSEU NEGATIVE 06/22/2021 1432   GLUCOSEU Negative 08/23/2013 1127   HGBUR NEGATIVE 06/22/2021 1432   BILIRUBINUR NEGATIVE 06/22/2021 1432   BILIRUBINUR Negative 08/23/2013 1127   KETONESUR NEGATIVE 06/22/2021 1432   PROTEINUR NEGATIVE 06/22/2021 1432   NITRITE NEGATIVE 06/22/2021 1432   LEUKOCYTESUR NEGATIVE 06/22/2021 1432   LEUKOCYTESUR Negative 08/23/2013 1127    FURTHER DISCHARGE INSTRUCTIONS:   Get Medicines reviewed and adjusted: Please take all your medications with you for your next visit with your Primary MD   Laboratory/radiological data: Please request your Primary MD to go over all  hospital tests and procedure/radiological results at the follow up, please ask your Primary MD to get all Hospital records sent to his/her office.   In some cases, they will be blood work, cultures and biopsy results pending at the time of your discharge. Please request that your primary care M.D. goes through all the records of your hospital data and follows up on these results.   Also Note the following: If you experience worsening of your admission symptoms, develop shortness of breath, life threatening emergency, suicidal or homicidal thoughts you must seek medical attention immediately by calling 911 or calling your MD immediately  if symptoms less severe.   You must read complete instructions/literature along with all the possible adverse reactions/side effects for all the Medicines you take and that have been prescribed to you. Take any new Medicines after you have completely understood and accpet all the possible adverse reactions/side effects.    Do not drive when taking Pain medications or sleeping medications (Benzodaizepines)   Do not take more than prescribed Pain, Sleep and Anxiety Medications. It is not advisable to combine anxiety,sleep and pain medications without talking with your primary care practitioner   Special Instructions: If you have smoked or chewed Tobacco  in the last 2 yrs please stop smoking, stop any regular Alcohol  and or any Recreational drug use.   Wear Seat belts while driving.   Please note: You were cared for by a hospitalist during your hospital stay. Once you are discharged, your primary care physician will handle any further medical issues. Please note that NO REFILLS for any discharge medications will  be authorized once you are discharged, as it is imperative that you return to your primary care physician (or establish a relationship with a primary care physician if you do not have one) for your post hospital discharge needs so that they can reassess your  need for medications and monitor your lab values.  Time coordinating discharge: 40 minutes  SIGNED:  Marzetta Board, MD, PhD 06/27/2021, 1:55 PM

## 2021-07-01 ENCOUNTER — Encounter: Payer: Self-pay | Admitting: Family Medicine

## 2021-07-01 ENCOUNTER — Ambulatory Visit (INDEPENDENT_AMBULATORY_CARE_PROVIDER_SITE_OTHER): Payer: No Typology Code available for payment source | Admitting: Family Medicine

## 2021-07-01 VITALS — BP 110/78 | HR 69 | Temp 97.9°F | Ht 72.0 in | Wt 121.6 lb

## 2021-07-01 DIAGNOSIS — G40909 Epilepsy, unspecified, not intractable, without status epilepticus: Secondary | ICD-10-CM | POA: Diagnosis not present

## 2021-07-01 DIAGNOSIS — E538 Deficiency of other specified B group vitamins: Secondary | ICD-10-CM

## 2021-07-01 DIAGNOSIS — J690 Pneumonitis due to inhalation of food and vomit: Secondary | ICD-10-CM

## 2021-07-01 NOTE — Progress Notes (Signed)
Established Patient Office Visit  Subjective:  Patient ID: Terrence Riley, male    DOB: 11/02/2000  Age: 20 y.o. MRN: [id number]  CC:  Chief Complaint  Patient presents with   Hospitalization Follow-up    HPI Terrence Riley presents to discuss recent hospitalization.  He has history of migraine headaches, history of mild intermittent asthma, history of GERD, history of eosinophilic esophagitis, history of seizure disorder.  Recent admission on 25 November for acute encephalopathy.  His recent history is that he had right shoulder surgery over a month ago.  He was recovering well from that.  He had developed acute confusion around 10 PM.  Mother had noted that he was napping around 6 PM.  She saw him that night confused and standing naked in the kitchen and he had urinated on the floor.  At that point she called EMS and he was taken to the ER.  Work-up for acute encephalopathy.  He had some low-grade fever and leukocytosis.  Patient had fairly extensive work-up.  Urine call drug culture negative.  Alcohol level negative.  White count 14.9.  LP was obtained and CSF did not show evidence for bacterial infection.  HSV PCR negative.  There was some question regarding drug compliance though patient states he been taking his Keppra and Topamax regularly though his levels were undetectably low.  Neurology felt that he probably had encephalopathy related to extended postictal phase.  He was placed back on Topamax, Keppra, and clonazepam and has had no further episodes since then.  He had some mild hypokalemia which was corrected.  He did have low vitamin B12.  Intrinsic factor antibodies were elevated.  He did receive 1 injection of B12 with recommendation to follow-up with [place] every month or so for repeat injections.  There was also question of aspiration pneumonia left upper lobe from CT scan chest.  He completed 5 days of antibiotics.  No cough or fever at this time.  Sepsis was ruled out.  Past  Medical History:  Diagnosis Date   Allergy    rhinitis   Asthma    as a child   Complication of anesthesia    pt. reports he need more anesthesia with the septoplasty surgery   Eosinophilic esophagitis    heartburn and reflux   Family history of adverse reaction to anesthesia    mother respiratory failure   Headache    Nasal fracture    deviated septum   Pneumonia    1x history of   Premature baby    2 months early   Seizures ([place])    well controlled on meds/ one 2 months ago when meds were late    Past Surgical History:  Procedure Laterality Date   ADENOIDECTOMY     CLOSED REDUCTION NASAL FRACTURE N/A 08/31/2017   Procedure: CLOSED REDUCTION NASAL FRACTURE;  Surgeon: Clyde Canterbury, MD;  Location: [facility];  Service: ENT;  Laterality: N/A;   ORIF HUMERUS FRACTURE Right 05/05/2021   Procedure: reverse Hill-Sachs allografting, biceps tenodesis;  Surgeon: [name], MD;  Location: [facility];  Service: Orthopedics;  Laterality: Right;   SEPTOPLASTY N/A 08/31/2017   Procedure: SEPTOPLASTY;  Surgeon: Clyde Canterbury, MD;  Location: [facility];  Service: ENT;  Laterality: N/A;   SHOULDER ARTHROSCOPY WITH LABRAL REPAIR Left 04/30/2020   Procedure: LEFT SHOULDER POSTERIOR LABRAL REPAIR WITH ARTHROSCOPY, BICEPS TENDON RELEASE AND TENODESIS, OPEN ALLOGRAFT FOR REVERSE BANKART LESION;  Surgeon: [name], MD;  Location:  [facility];  Service: Orthopedics;  Laterality: Left;   SHOULDER ARTHROSCOPY WITH LABRAL REPAIR Right 05/05/2021   Procedure: right shoulder arthroscopy, biceps release, posterior labral repair;;  Surgeon: [name], MD;  Location: [facility];  Service: Orthopedics;  Laterality: Right;   TONSILLECTOMY     and addenoids    Family History  Problem Relation Age of Onset   Cancer Mother        breat   Protein S deficiency Mother     Social History   Socioeconomic History   Marital status: Significant Other    Spouse name: Not on file    Number of children: Not on file   Years of education: Not on file   Highest education level: Not on file  Occupational History   Not on file  Tobacco Use   Smoking status: Never   Smokeless tobacco: Never  Vaping Use   Vaping Use: Never used  Substance and Sexual Activity   Alcohol use: No   Drug use: No   Sexual activity: Never  Other Topics Concern   Not on file  Social History Narrative   Right handed   Social Determinants of Health   Financial Resource Strain: Not on file  Food Insecurity: Not on file  Transportation Needs: Not on file  Physical Activity: Not on file  Stress: Not on file  Social Connections: Not on file  Intimate Partner Violence: Not on file    Outpatient Medications Prior to Visit  Medication Sig Dispense Refill   cetirizine (ZYRTEC) 5 MG tablet Take 5 mg by mouth at bedtime.     clonazePAM (KLONOPIN) 0.5 MG tablet Take 1 tablet (0.5 mg total) by mouth 2 (two) times daily. 30 tablet 0   cyanocobalamin (,VITAMIN B-12,) 1000 MCG/ML injection Inject 1 mL (1,000 mcg total) into the muscle every 30 (thirty) days. 1 mL 0   EPINEPHrine 0.3 mg/0.3 mL IJ SOAJ injection Inject 0.3 mg into the muscle as needed for anaphylaxis. 1 each 0   KEPPRA 1000 MG tablet Take 2 tablets (2,000 mg total) by mouth 2 (two) times daily. 120 tablet 0   lidocaine (LIDODERM) 5 % Place 1 patch onto the skin daily. Remove & Discard patch within 12 hours or as directed by MD (Patient taking differently: Place 1 patch onto the skin daily as needed (pain). Remove & Discard patch within 12 hours or as directed by MD) 30 patch 0   methocarbamol (ROBAXIN) 500 MG tablet Take 1 tablet (500 mg total) by mouth every 8 (eight) hours as needed for up to 10 days for muscle spasms. 30 tablet 0   promethazine (PHENERGAN) 25 MG tablet Take 1 tablet (25 mg total) by mouth every 8 (eight) hours as needed for nausea or vomiting. 10 tablet 0   topiramate (TOPAMAX) 200 MG tablet Take 1 tablet (200 mg total)  by mouth 2 (two) times daily. 60 tablet 0   No facility-administered medications prior to visit.    Allergies  Allergen Reactions   Lortab [Hydrocodone-Acetaminophen] Hives   Other Anaphylaxis    Peanuts   Zofran [Ondansetron Hcl] Nausea And Vomiting   Citrus Other (See Comments)    Sores in mouth   Dairy Aid [Tilactase] Nausea And Vomiting   Influenza Virus Vaccine Other (See Comments)    Partial paralysis for a couple of weeks per mom   Soy Allergy Other (See Comments)    Seizures    Hydrocodone-Acetaminophen Rash    ROS Review of  Systems  Constitutional:  Negative for chills and fever.  Respiratory:  Negative for cough and shortness of breath.   Cardiovascular:  Negative for chest pain.  Gastrointestinal:  Negative for abdominal pain.  Genitourinary:  Negative for dysuria.  Neurological:  Negative for seizures, speech difficulty, weakness and headaches.  Psychiatric/Behavioral:  Negative for confusion.      Objective:    Physical Exam Vitals reviewed.  Constitutional:      Appearance: Normal appearance.  Cardiovascular:     Rate and Rhythm: Normal rate and regular rhythm.  Pulmonary:     Effort: Pulmonary effort is normal.     Breath sounds: Normal breath sounds.  Neurological:     General: No focal deficit present.     Mental Status: He is alert.     Cranial Nerves: No cranial nerve deficit.  Psychiatric:        Mood and Affect: Mood normal.        Thought Content: Thought content normal.        Judgment: Judgment normal.    BP 110/78 (BP Location: Left Arm, Patient Position: Sitting, Cuff Size: Normal)   Pulse 69   Temp 97.9 F (36.6 C) (Oral)   Ht 6' (1.829 m)   Wt 121 lb 9.6 oz (55.2 kg)   SpO2 98%   BMI 16.49 kg/m  Wt Readings from Last 3 Encounters:  07/01/21 121 lb 9.6 oz (55.2 kg)  06/20/21 150 lb (68 kg)  06/10/21 130 lb (59 kg)     Health Maintenance Due  Topic Date Due   Pneumococcal Vaccine 4-36 Years old (1 - PCV) 07/02/2006    COVID-19 Vaccine (2 - Booster for Janssen series) 02/25/2020   INFLUENZA VACCINE  02/24/2021    There are no preventive care reminders to display for this patient.  Lab Results  Component Value Date   TSH 1.729 06/22/2021   Lab Results  Component Value Date   WBC 4.8 06/25/2021   HGB 13.0 06/25/2021   HCT 37.0 (L) 06/25/2021   MCV 83.9 06/25/2021   PLT 184 06/25/2021   Lab Results  Component Value Date   NA 138 06/26/2021   K 3.7 06/26/2021   CO2 19 (L) 06/26/2021   GLUCOSE 91 06/26/2021   BUN 12 06/26/2021   CREATININE 1.02 06/26/2021   BILITOT 0.7 06/20/2021   ALKPHOS 62 06/20/2021   AST 33 06/20/2021   ALT 28 06/20/2021   PROT 6.4 (L) 06/20/2021   ALBUMIN 3.9 06/20/2021   CALCIUM 9.3 06/26/2021   ANIONGAP 7 06/26/2021   Lab Results  Component Value Date   CHOL 125 03/12/2020   Lab Results  Component Value Date   HDL 49 03/12/2020   Lab Results  Component Value Date   LDLCALC 63 03/12/2020   Lab Results  Component Value Date   TRIG 51 03/12/2020   Lab Results  Component Value Date   CHOLHDL 2.6 03/12/2020   No results found for: HGBA1C    Assessment & Plan:   #1 recent acute encephalopathy.  Infectious causes ruled out.  Question of extended postictal phase from seizure.  Patient had suboptimal drug levels as above. -Continue close follow-up with neurology.  He is now back on Topamax, Keppra and clonazepam and has remained seizure-free since getting out of hospital  #2 low B12.  Apparently his mother has history of B12 deficiency.  He just received injection 4 days ago.  We discussed setting up regimen of monthly B12 and then recheck  levels in a few months.  #3 probable recent aspiration pneumonia.  This was noted on CT scan.  He has no cough or residual symptoms this time as he completed course of antibiotics.  #4 recent elevated CPK probably related to seizure.  No orders of the defined types were placed in this encounter.   Follow-up: No  follow-ups on file.    Carolann Littler, MD

## 2021-07-01 NOTE — Patient Instructions (Signed)
We will need to set up at least monthly B12 injections for now.

## 2021-07-02 ENCOUNTER — Telehealth (INDEPENDENT_AMBULATORY_CARE_PROVIDER_SITE_OTHER): Payer: No Typology Code available for payment source | Admitting: Family Medicine

## 2021-07-02 ENCOUNTER — Encounter: Payer: Self-pay | Admitting: Family Medicine

## 2021-07-02 VITALS — Ht 72.0 in

## 2021-07-02 DIAGNOSIS — G40909 Epilepsy, unspecified, not intractable, without status epilepticus: Secondary | ICD-10-CM

## 2021-07-02 DIAGNOSIS — G9340 Encephalopathy, unspecified: Secondary | ICD-10-CM

## 2021-07-02 DIAGNOSIS — E538 Deficiency of other specified B group vitamins: Secondary | ICD-10-CM

## 2021-07-02 NOTE — Progress Notes (Signed)
Patient ID: PLEAS CARNEAL, male   DOB: 03/16/2001, 20 y.o.   MRN: [id number]  This visit type was conducted due to national recommendations for restrictions regarding the COVID-19 pandemic in an effort to limit this patient's exposure and mitigate transmission in our community.   Virtual Visit via Video Note  I connected with Gwynneth Macleod on 07/02/21 at  5:15 PM EST by a video enabled telemedicine application and verified that I am speaking with the correct person using two identifiers.  Location patient: home Location provider:work or home office Persons participating in the virtual visit: patient, provider  I discussed the limitations of evaluation and management by telemedicine and the availability of in person appointments. The patient expressed understanding and agreed to proceed.   HPI:  Connected via virtual visit with patient and his mother.  Recent admission for acute encephalopathy.  Refer to note from yesterday for details regarding recent hospitalization.    Recent admission on 25 November for acute encephalopathy.  His recent history is that he had right shoulder surgery over a month ago.  He was recovering well from that.  He had developed acute confusion around 10 PM.  Mother had noted that he was napping around 6 PM.  She saw him that night confused and standing naked in the kitchen and he had urinated on the floor.  At that point she called EMS and he was taken to the ER.   Work-up for acute encephalopathy.  He had some low-grade fever and leukocytosis.  Patient had fairly extensive work-up.  Urine call drug culture negative.  Alcohol level negative.  White count 14.9.  LP was obtained and CSF did not show evidence for bacterial infection.  HSV PCR negative.  There was some question regarding drug compliance though patient states he been taking his Keppra and Topamax regularly though his levels were undetectably low.  Neurology felt that he probably had encephalopathy related  to extended postictal phase.  He was placed back on Topamax, Keppra, and clonazepam and has had no further episodes since then.   He had some mild hypokalemia which was corrected.  He did have low vitamin B12.  Intrinsic factor antibodies were elevated.  He did receive 1 injection of B12 with recommendation to follow-up with [place] every month or so for repeat injections.  There was also question of aspiration pneumonia left upper lobe from CT scan chest.  He completed 5 days of antibiotics.  No cough or fever at this time.  Sepsis was ruled out.  Mother still has several questions that she feels like have not been adequately answered.  She is not convinced at all that this was seizure related activity.  She states that his recent behavior was very atypical compared with prior events.  She is also concerned regarding his multiple other recent abnormalities including hypokalemia, elevated creatinine kinase, low B12.  There is some confusion but apparently they think that he had gotten some B12 subcutaneous injections abdominally.  I was only able to confirm that he had 1 B12 injection and looking back through medication records.  He has not had any obvious seizure activity since discharge but mom states that he has been very fatigued and lethargic still at times.  No recurrent confusion.  His confusion apparently lasted for few days after his recent admission.  Drug screen was unremarkable.  They do not have appointment yet with his current neurologist.  They would like to set that up soon.  ROS: See pertinent positives  and negatives per HPI.  Past Medical History:  Diagnosis Date   Allergy    rhinitis   Asthma    as a child   Complication of anesthesia    pt. reports he need more anesthesia with the septoplasty surgery   Eosinophilic esophagitis    heartburn and reflux   Family history of adverse reaction to anesthesia    mother respiratory failure   Headache    Nasal fracture    deviated  septum   Pneumonia    1x history of   Premature baby    2 months early   Seizures ([place])    well controlled on meds/ one 2 months ago when meds were late    Past Surgical History:  Procedure Laterality Date   ADENOIDECTOMY     CLOSED REDUCTION NASAL FRACTURE N/A 08/31/2017   Procedure: CLOSED REDUCTION NASAL FRACTURE;  Surgeon: Clyde Canterbury, MD;  Location: [facility];  Service: ENT;  Laterality: N/A;   ORIF HUMERUS FRACTURE Right 05/05/2021   Procedure: reverse Hill-Sachs allografting, biceps tenodesis;  Surgeon: [name], MD;  Location: [facility];  Service: Orthopedics;  Laterality: Right;   SEPTOPLASTY N/A 08/31/2017   Procedure: SEPTOPLASTY;  Surgeon: Clyde Canterbury, MD;  Location: [facility];  Service: ENT;  Laterality: N/A;   SHOULDER ARTHROSCOPY WITH LABRAL REPAIR Left 04/30/2020   Procedure: LEFT SHOULDER POSTERIOR LABRAL REPAIR WITH ARTHROSCOPY, BICEPS TENDON RELEASE AND TENODESIS, OPEN ALLOGRAFT FOR REVERSE BANKART LESION;  Surgeon: [name], MD;  Location: [facility];  Service: Orthopedics;  Laterality: Left;   SHOULDER ARTHROSCOPY WITH LABRAL REPAIR Right 05/05/2021   Procedure: right shoulder arthroscopy, biceps release, posterior labral repair;;  Surgeon: [name], MD;  Location: [facility];  Service: Orthopedics;  Laterality: Right;   TONSILLECTOMY     and addenoids    Family History  Problem Relation Age of Onset   Cancer Mother        breat   Protein S deficiency Mother     SOCIAL HX: Non-smoker   Current Outpatient Medications:    cetirizine (ZYRTEC) 5 MG tablet, Take 5 mg by mouth at bedtime., Disp: , Rfl:    clonazePAM (KLONOPIN) 0.5 MG tablet, Take 1 tablet (0.5 mg total) by mouth 2 (two) times daily., Disp: 30 tablet, Rfl: 0   cyanocobalamin (,VITAMIN B-12,) 1000 MCG/ML injection, Inject 1 mL (1,000 mcg total) into the muscle every 30 (thirty) days., Disp: 1 mL, Rfl: 0   EPINEPHrine 0.3 mg/0.3 mL IJ SOAJ injection, Inject 0.3  mg into the muscle as needed for anaphylaxis., Disp: 1 each, Rfl: 0   KEPPRA 1000 MG tablet, Take 2 tablets (2,000 mg total) by mouth 2 (two) times daily., Disp: 120 tablet, Rfl: 0   lidocaine (LIDODERM) 5 %, Place 1 patch onto the skin daily. Remove & Discard patch within 12 hours or as directed by MD (Patient taking differently: Place 1 patch onto the skin daily as needed (pain). Remove & Discard patch within 12 hours or as directed by MD), Disp: 30 patch, Rfl: 0   methocarbamol (ROBAXIN) 500 MG tablet, Take 1 tablet (500 mg total) by mouth every 8 (eight) hours as needed for up to 10 days for muscle spasms., Disp: 30 tablet, Rfl: 0   promethazine (PHENERGAN) 25 MG tablet, Take 1 tablet (25 mg total) by mouth every 8 (eight) hours as needed for nausea or vomiting., Disp: 10 tablet, Rfl: 0   topiramate (TOPAMAX) 200 MG tablet, Take  1 tablet (200 mg total) by mouth 2 (two) times daily., Disp: 60 tablet, Rfl: 0  EXAM:  VITALS per patient if applicable:  GENERAL: alert, oriented, appears well and in no acute distress  HEENT: atraumatic, conjunttiva clear, no obvious abnormalities on inspection of external nose and ears  NECK: normal movements of the head and neck  LUNGS: on inspection no signs of respiratory distress, breathing rate appears normal, no obvious gross SOB, gasping or wheezing  CV: no obvious cyanosis  MS: moves all visible extremities without noticeable abnormality  PSYCH/NEURO: pleasant and cooperative, no obvious depression or anxiety, speech and thought processing grossly intact  ASSESSMENT AND PLAN:  Discussed the following assessment and plan:  #1 recent admission for acute encephalopathy.  Infectious etiology was ruled out.  MRI brain unremarkable.  Patient did have suboptimal drug levels and was thought to have possible extended postictal phase from seizure -We recommended follow-up with his primary neurologist as an outpatient to review recent records.  Mom still has  concerns as above regarding the diagnosis.  #2 low B12.  Abnormal intrinsic factor.  We will look further to see and confirm whether he had more than 1 B12 injection.  If not we will recommend weekly B12 IM for 4 weeks followed by every other week for a month and then monthly and recommend follow-up B12 in about 3 months  #3 recent aspiration pneumonia on CT scan.  Patient treated with antibiotics and clinically improved with regard to cough     I discussed the assessment and treatment plan with the patient. The patient was provided an opportunity to ask questions and all were answered. The patient agreed with the plan and demonstrated an understanding of the instructions.   The patient was advised to call back or seek an in-person evaluation if the symptoms worsen or if the condition fails to improve as anticipated.     Carolann Littler, MD

## 2021-07-03 ENCOUNTER — Ambulatory Visit: Payer: No Typology Code available for payment source | Attending: Internal Medicine | Admitting: Occupational Therapy

## 2021-07-04 ENCOUNTER — Telehealth: Payer: Self-pay | Admitting: *Deleted

## 2021-07-04 NOTE — Chronic Care Management (AMB) (Signed)
  Care Management   Outreach Note  07/04/2021 Name: Terrence Riley MRN: [id number] DOB: 11-Jul-2001  Referred by: Eulas Post, MD Reason for referral : Care Coordination (Initial outreach to schedule referral with [facility] )   An unsuccessful telephone outreach was attempted today. The patient was referred to the case management team for assistance with care management and care coordination.   Follow Up Plan:  The care management team will reach out to the patient again over the next 7 days. If patient returns call to provider office, please advise to call [facility] at [phone].  [facility]  Direct Dial: [phone]

## 2021-07-06 ENCOUNTER — Emergency Department
Admission: EM | Admit: 2021-07-06 | Discharge: 2021-07-06 | Disposition: A | Discharge status: Home / Self Care | Payer: No Typology Code available for payment source | Attending: Emergency Medicine | Admitting: Emergency Medicine

## 2021-07-06 ENCOUNTER — Emergency Department: Payer: No Typology Code available for payment source

## 2021-07-06 ENCOUNTER — Other Ambulatory Visit: Payer: Self-pay

## 2021-07-06 ENCOUNTER — Encounter: Payer: Self-pay | Admitting: Emergency Medicine

## 2021-07-06 DIAGNOSIS — R0602 Shortness of breath: Secondary | ICD-10-CM | POA: Diagnosis not present

## 2021-07-06 DIAGNOSIS — R5383 Other fatigue: Secondary | ICD-10-CM | POA: Diagnosis not present

## 2021-07-06 DIAGNOSIS — Z5321 Procedure and treatment not carried out due to patient leaving prior to being seen by health care provider: Secondary | ICD-10-CM | POA: Insufficient documentation

## 2021-07-06 DIAGNOSIS — R0789 Other chest pain: Secondary | ICD-10-CM | POA: Insufficient documentation

## 2021-07-06 LAB — BASIC METABOLIC PANEL
Anion gap: 6 (ref 5–15)
BUN: 16 mg/dL (ref 6–20)
CO2: 23 mmol/L (ref 22–32)
Calcium: 9.5 mg/dL (ref 8.9–10.3)
Chloride: 109 mmol/L (ref 98–111)
Creatinine, Ser: 0.96 mg/dL (ref 0.61–1.24)
GFR, Estimated: >60 mL/min (ref >60)
Glucose, Bld: 91 mg/dL (ref 70–99)
Potassium: 3.6 mmol/L (ref 3.5–5.1)
Sodium: 138 mmol/L (ref 135–145)

## 2021-07-06 LAB — TROPONIN I (HIGH SENSITIVITY): Troponin I (High Sensitivity): <2 ng/L (ref <18)

## 2021-07-06 LAB — CBC
HCT: 39.8 % (ref 39.0–52.0)
Hemoglobin: 13.5 g/dL (ref 13.0–17.0)
MCH: 28.7 pg (ref 26.0–34.0)
MCHC: 33.9 g/dL (ref 30.0–36.0)
MCV: 84.7 fL (ref 80.0–100.0)
Platelets: 224 10*3/uL (ref 150–400)
RBC: 4.7 MIL/uL (ref 4.22–5.81)
RDW: 13.1 % (ref 11.5–15.5)
WBC: 3.5 10*3/uL — ABNORMAL LOW (ref 4.0–10.5)
nRBC: 0 % (ref 0.0–0.2)

## 2021-07-06 IMAGING — CR DG CHEST 2V
2 series · 2 of 2 positions shown · non-contrast
Comparison: None.

CLINICAL DATA: Chest pain

EXAM:
CHEST - 2 VIEW

[chest pa]
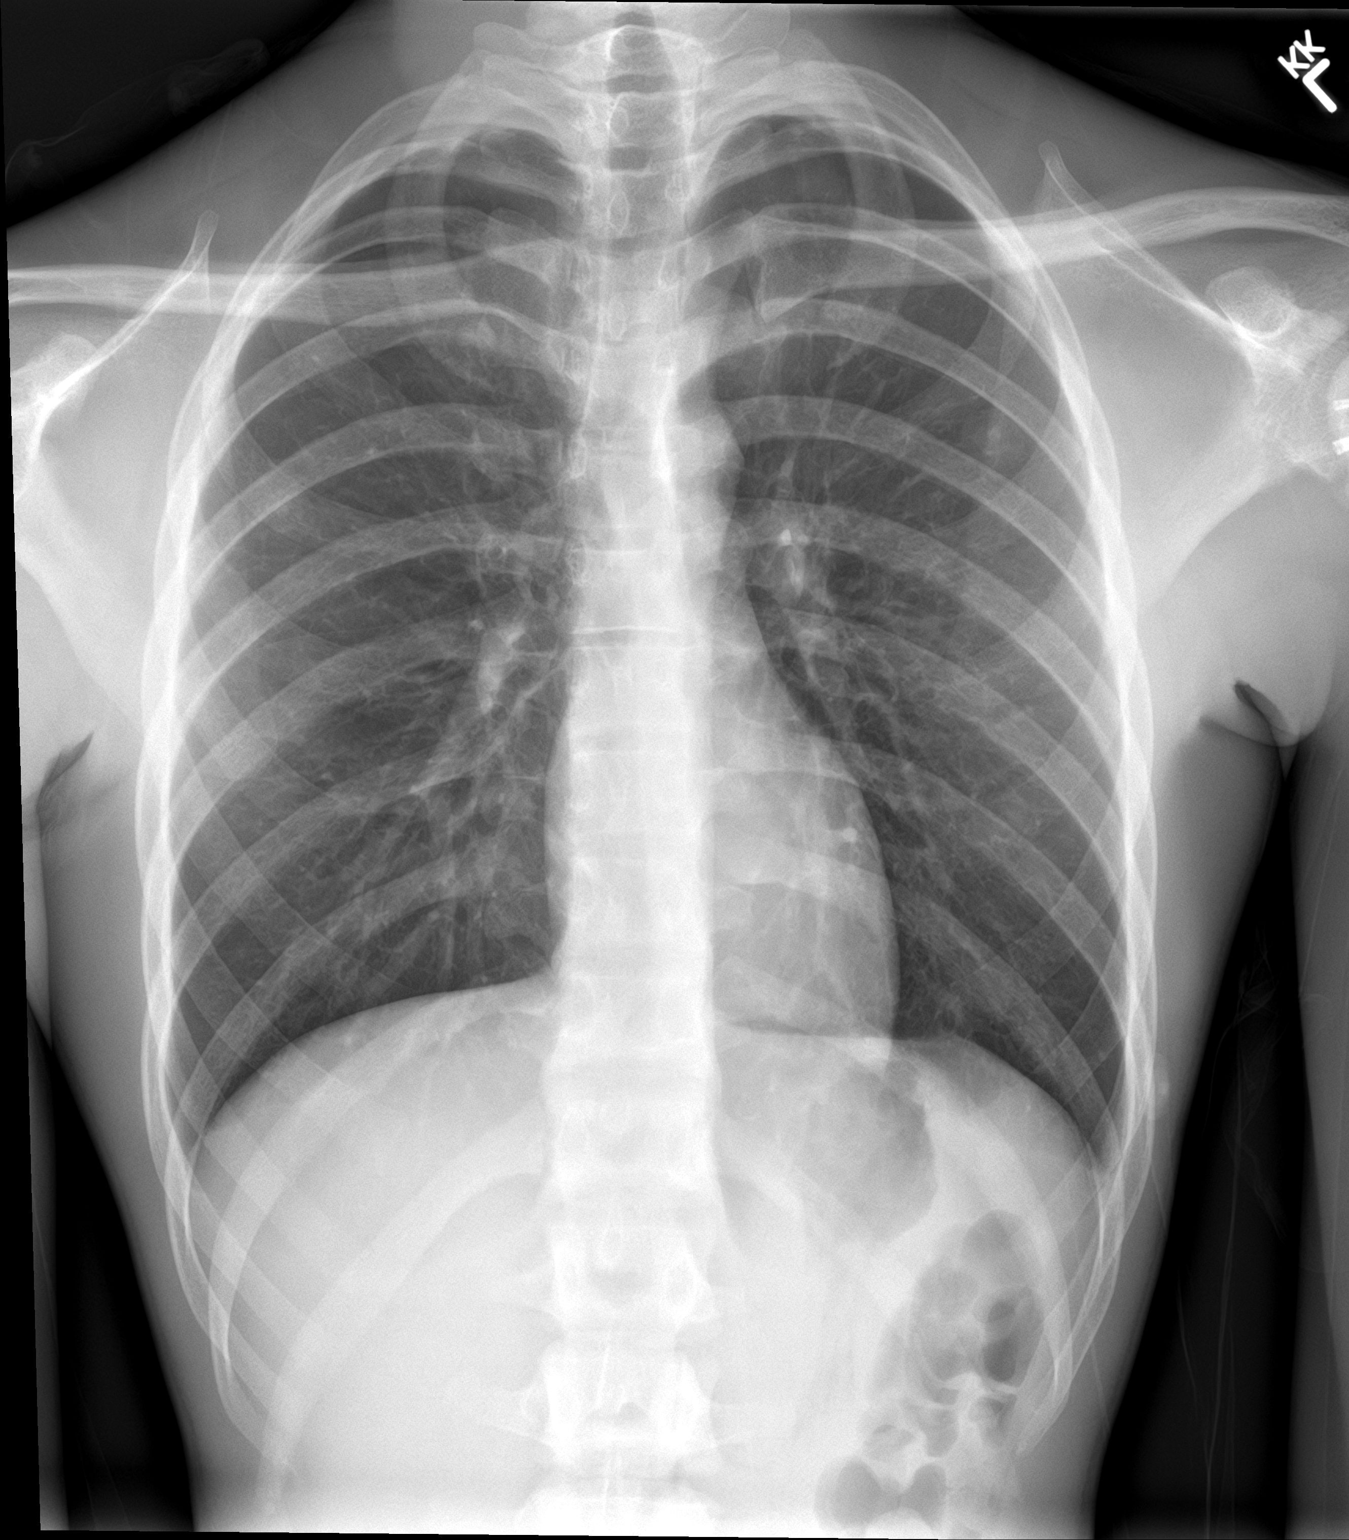

[chest lat]
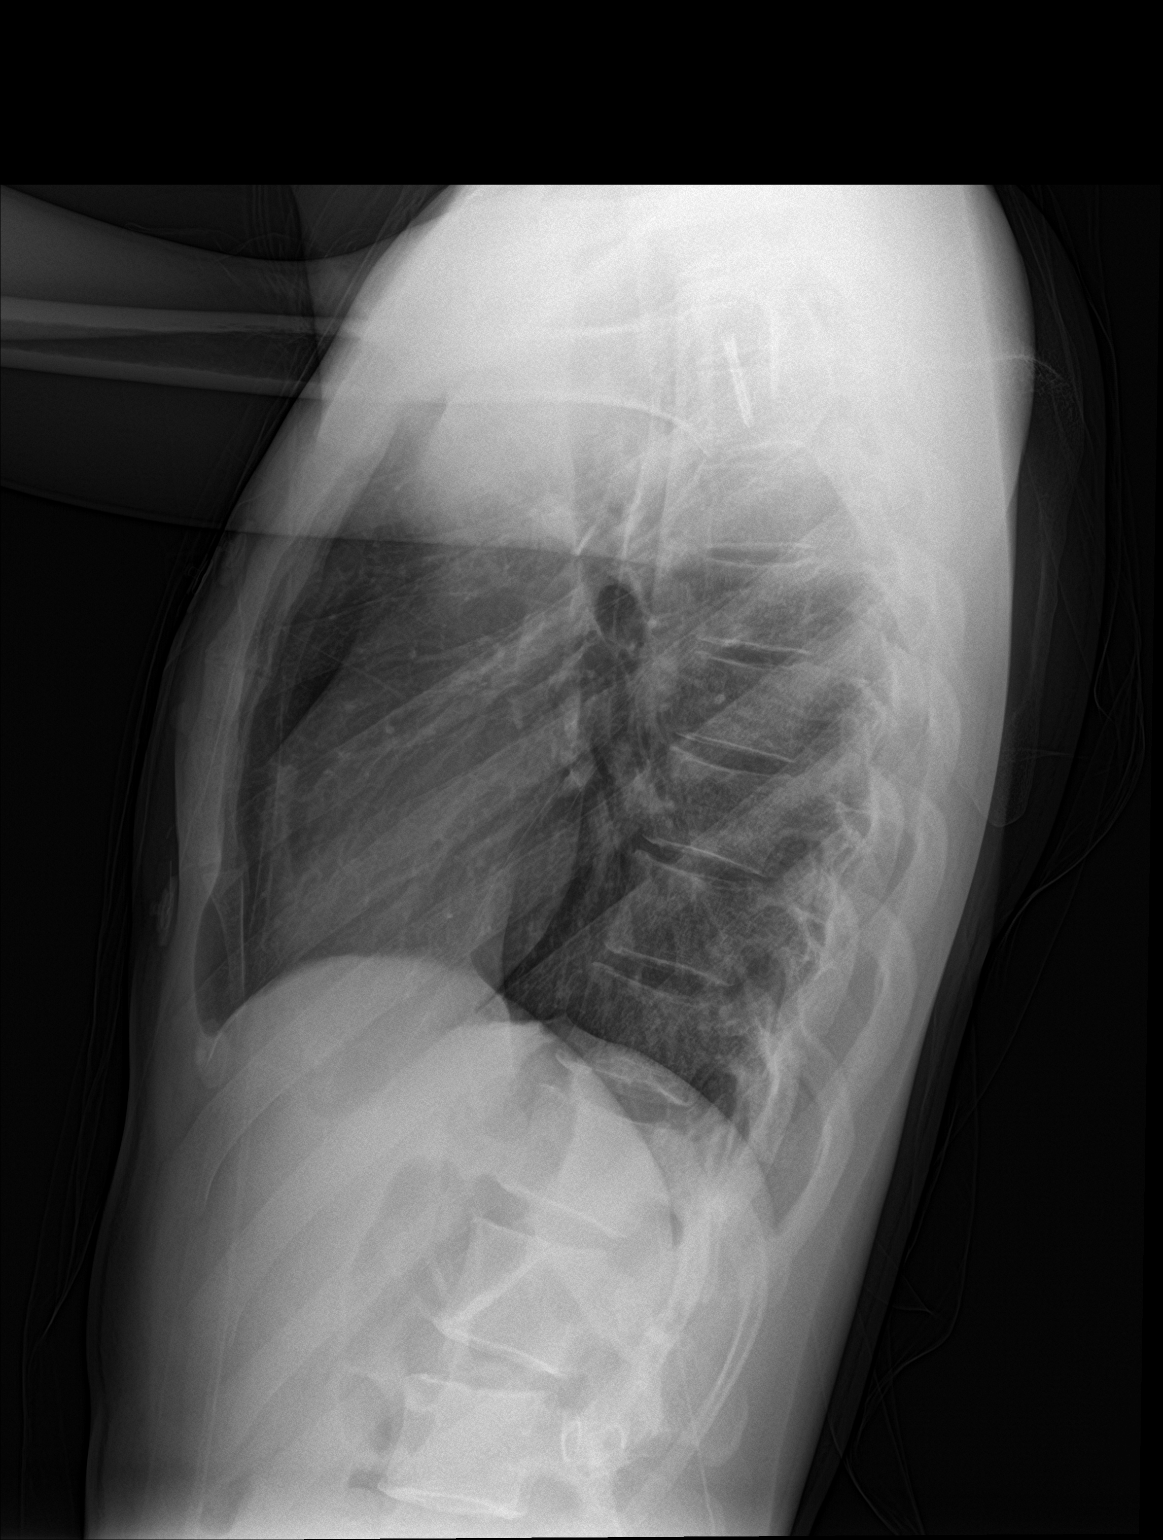

[2 of 2 positions shown; findings below may reference images not displayed]

FINDINGS: The heart size and mediastinal contours are within normal limits.
Both lungs are clear. The visualized skeletal structures are
unremarkable.
IMPRESSION: No active cardiopulmonary disease.

## 2021-07-06 NOTE — ED Triage Notes (Signed)
Pt called x's 2, no response 

## 2021-07-06 NOTE — ED Notes (Signed)
Pt called x's 3, no response ?

## 2021-07-06 NOTE — ED Triage Notes (Signed)
Pt via POV from home. Pt was picked up and dropped off by grandmother. Pt c/o L sided CP since for about an hour. Also, endorse SOB. Denies fever/cough. Pt is not easily arousable and lethargic. When asked if he has taken any any medication or used any drugs pt denies. Pt is A&Ox4

## 2021-07-06 NOTE — ED Triage Notes (Signed)
Pt called x's 1, no repsonse

## 2021-07-08 ENCOUNTER — Other Ambulatory Visit: Payer: Self-pay

## 2021-07-08 ENCOUNTER — Encounter: Payer: Self-pay | Admitting: Family Medicine

## 2021-07-08 DIAGNOSIS — E538 Deficiency of other specified B group vitamins: Secondary | ICD-10-CM

## 2021-07-09 NOTE — Chronic Care Management (AMB) (Signed)
°  Care Management   Outreach Note  07/09/2021 Name: Terrence Riley MRN: [id number] DOB: 08/12/2000  Referred by: Eulas Post, MD Reason for referral : Care Coordination (Initial outreach to schedule referral with [facility] )   A second unsuccessful telephone outreach was attempted today. The patient was referred to the case management team for assistance with care management and care coordination.   Follow Up Plan:  The care management team will reach out to the patient again over the next 7 days.  If patient returns call to provider office, please advise to call [place]* at [phone].*  [facility] Management  Direct Dial: [phone]

## 2021-07-14 ENCOUNTER — Telehealth: Payer: Self-pay

## 2021-07-14 ENCOUNTER — Other Ambulatory Visit (INDEPENDENT_AMBULATORY_CARE_PROVIDER_SITE_OTHER): Payer: No Typology Code available for payment source

## 2021-07-14 ENCOUNTER — Ambulatory Visit: Payer: No Typology Code available for payment source | Admitting: Family Medicine

## 2021-07-14 DIAGNOSIS — E538 Deficiency of other specified B group vitamins: Secondary | ICD-10-CM | POA: Diagnosis not present

## 2021-07-14 MED ORDER — CYANOCOBALAMIN 1000 MCG/ML IJ SOLN
1000.0000 ug | Freq: Once | INTRAMUSCULAR | Status: AC
Start: 2021-07-14 — End: 2021-07-14
  Administered 2021-07-14: 14:00:00 1000 ug via INTRAMUSCULAR

## 2021-07-14 NOTE — Chronic Care Management (AMB) (Signed)
°  Care Management   Outreach Note  07/14/2021 Name: Terrence Riley MRN: [id number] DOB: 08-Feb-2001  Referred by: Eulas Post, MD Reason for referral : Care Coordination (Initial outreach to schedule referral with [facility] )   Third unsuccessful telephone outreach was attempted today. The patient was referred to the case management team for assistance with care management and care coordination. The patient's primary care provider has been notified of our unsuccessful attempts to make or maintain contact with the patient. The care management team is pleased to engage with this patient at any time in the future should he/she be interested in assistance from the care management team.   Follow Up Plan:  We have been unable to make contact with the patient for follow up. The care management team is available to follow up with the patient after provider conversation with the patient regarding recommendation for care management engagement and subsequent re-referral to the care management team.  A HIPAA compliant phone message was left for the patient providing contact information and requesting a return call.   [facility] Management  Direct Dial: [phone]

## 2021-07-14 NOTE — Chronic Care Management (AMB) (Signed)
°  Care Management   Note  07/14/2021 Name: JONPAUL LUMM MRN: [id number] DOB: 15-May-2001  Floyde Parkins is a 20 y.o. year old male who is a primary care patient of Burchette, Alinda Sierras, MD. I reached out to Floyde Parkins by phone today in response to a referral sent by Mr. ARISTOTELIS VILARDI primary care provider.   Mr. Lembcke was given information about care management services today including:  Care management services include personalized support from designated clinical staff supervised by his physician, including individualized plan of care and coordination with other care providers 24/7 contact phone numbers for assistance for urgent and routine care needs. The patient may stop care management services at any time by phone call to the office staff.  Patient agreed to services and verbal consent obtained.   Follow up plan: Telephone appointment with care management team member scheduled for:08/07/20  [place] Management  Direct Dial: [phone]

## 2021-07-14 NOTE — Telephone Encounter (Signed)
Transition Care Management Unsuccessful Follow-up Telephone Call  Date of discharge and from where:  06/27/2021 Terrence Riley  Attempts:  1st Attempt  Reason for unsuccessful TCM follow-up call:  No answer/busy Tomasa Rand, RN, BSN, CEN [place] Coordinator [phone]

## 2021-07-15 LAB — VITAMIN B12: Vitamin B-12: 241 pg/mL (ref 211–911)

## 2021-07-16 ENCOUNTER — Ambulatory Visit (INDEPENDENT_AMBULATORY_CARE_PROVIDER_SITE_OTHER): Payer: No Typology Code available for payment source | Admitting: Orthopedic Surgery

## 2021-07-16 ENCOUNTER — Other Ambulatory Visit: Payer: Self-pay | Admitting: Neurology

## 2021-07-16 ENCOUNTER — Ambulatory Visit (INDEPENDENT_AMBULATORY_CARE_PROVIDER_SITE_OTHER): Payer: No Typology Code available for payment source | Admitting: Family Medicine

## 2021-07-16 ENCOUNTER — Other Ambulatory Visit: Payer: Self-pay

## 2021-07-16 ENCOUNTER — Telehealth: Payer: Self-pay | Admitting: Neurology

## 2021-07-16 ENCOUNTER — Other Ambulatory Visit (HOSPITAL_COMMUNITY): Payer: Self-pay

## 2021-07-16 VITALS — BP 122/62 | HR 82 | Temp 98.6°F | Wt 121.9 lb

## 2021-07-16 DIAGNOSIS — R1110 Vomiting, unspecified: Secondary | ICD-10-CM | POA: Diagnosis not present

## 2021-07-16 DIAGNOSIS — E538 Deficiency of other specified B group vitamins: Secondary | ICD-10-CM | POA: Diagnosis not present

## 2021-07-16 DIAGNOSIS — Z9889 Other specified postprocedural states: Secondary | ICD-10-CM

## 2021-07-16 DIAGNOSIS — G9340 Encephalopathy, unspecified: Secondary | ICD-10-CM

## 2021-07-16 DIAGNOSIS — G40909 Epilepsy, unspecified, not intractable, without status epilepticus: Secondary | ICD-10-CM

## 2021-07-16 MED ORDER — CLONAZEPAM 0.5 MG PO TABS
0.5000 mg | ORAL_TABLET | Freq: Two times a day (BID) | ORAL | 5 refills | Status: DC
Start: 2021-07-16 — End: 2021-07-16

## 2021-07-16 MED ORDER — CLONAZEPAM 0.5 MG PO TABS
0.5000 mg | ORAL_TABLET | Freq: Two times a day (BID) | ORAL | 5 refills | Status: DC
  Filled 2021-07-16: qty 60, 30d supply, fill #0

## 2021-07-16 MED ORDER — TOPIRAMATE 200 MG PO TABS: 200.0000 mg | ORAL_TABLET | Freq: Two times a day (BID) | ORAL | 3 refills | Status: DC

## 2021-07-16 MED ORDER — TOPIRAMATE 200 MG PO TABS
200.0000 mg | ORAL_TABLET | Freq: Two times a day (BID) | ORAL | 3 refills | Status: DC
  Filled 2021-07-16: qty 180, 90d supply, fill #0

## 2021-07-16 MED ORDER — PROMETHAZINE HCL 25 MG PO TABS
25.0000 mg | ORAL_TABLET | Freq: Three times a day (TID) | ORAL | 0 refills | Status: DC | PRN
Start: 2021-07-16 — End: 2021-11-18

## 2021-07-16 MED ORDER — KEPPRA 1000 MG PO TABS
2000.0000 mg | ORAL_TABLET | Freq: Two times a day (BID) | ORAL | 0 refills | Status: DC
  Filled 2021-07-16: qty 120, 30d supply, fill #0

## 2021-07-16 NOTE — Progress Notes (Signed)
Established Patient Office Visit  Subjective:  Patient ID: Terrence Riley, male    DOB: [birth year]  Age: 20 y.o. MRN: [id number]  CC:  Chief Complaint  Patient presents with   Hospitalization Follow-up    HPI Terrence Riley is seen with mother to discuss further details regarding recent hospitalization.  Recent admission November 25 through December 2 for acute encephalopathy.  Refer to details from 12-12/2020 for details regarding recent admission  Recent admission on 25 November for acute encephalopathy.  His recent history is that he had right shoulder surgery over a month ago.  He was recovering well from that.  He had developed acute confusion around 10 PM.  Mother had noted that he was napping around 6 PM.  She saw him that night confused and standing naked in the kitchen and he had urinated on the floor.  At that point she called EMS and he was taken to the ER.   Work-up for acute encephalopathy.  He had some low-grade fever and leukocytosis.  Patient had fairly extensive work-up.  Urine call drug culture negative.  Alcohol level negative.  White count 14.9.  LP was obtained and CSF did not show evidence for bacterial infection.  HSV PCR negative.  There was some question regarding drug compliance though patient states he been taking his Keppra and Topamax regularly though his levels were undetectably low.  Neurology felt that he probably had encephalopathy related to extended postictal phase.  He was placed back on Topamax, Keppra, and clonazepam and has had no further episodes since then.   He had some mild hypokalemia which was corrected.  He did have low vitamin B12.  Intrinsic factor antibodies were elevated.  He did receive 1 injection of B12 with recommendation to follow-up with [place] every month or so for repeat injections.  There was also question of aspiration pneumonia left upper lobe from CT scan chest.  He completed 5 days of antibiotics.  No cough or fever at this time.   Sepsis was ruled out.  Working diagnosis from neurology was possible extended postictal phase.  His EEG did not show any obvious seizure activity.  He did have negative urine drug screen for benzodiazepine which suggest that he was not taking clonazepam and also his Keppra and Topamax levels were low.  He did interestingly have low B12 but normal MMA.  Initial intrinsic factor antibody was normal and subsequent 1 was elevated.  He is not a vegetarian.  Has had a couple of B12 injections thus far and actually had repeat level Monday which came back over 200.  Still low end of normal.  Patient states he is back compliant with medications at this time.  He apparently Sunday went to church with his grandmother and became very lethargic and ended up going to the ER.  He had lab work including troponin because of some atypical chest pain and CBC and chemistries and these were unremarkable.  He ended up leaving before being seen.  Mom has concerns that he has some depression issues.  He continues to complain of fatigue and apparently has had some recent recurrent vomiting.  No diarrhea.  Occasional abdominal cramping but no localized abdominal pain.  No known family history of inflammatory bowel.  Weight is down about 3 pounds from last summer.  Generally has poor appetite  Past Medical History:  Diagnosis Date   Allergy    rhinitis   Asthma    as a child   Complication  of anesthesia    pt. reports he need more anesthesia with the septoplasty surgery   Eosinophilic esophagitis    heartburn and reflux   Family history of adverse reaction to anesthesia    mother respiratory failure   Headache    Nasal fracture    deviated septum   Pneumonia    1x history of   Premature baby    2 months early   Seizures ([place])    well controlled on meds/ one 2 months ago when meds were late    Past Surgical History:  Procedure Laterality Date   ADENOIDECTOMY     CLOSED REDUCTION NASAL FRACTURE N/A 08/31/2017    Procedure: CLOSED REDUCTION NASAL FRACTURE;  Surgeon: Clyde Canterbury, MD;  Location: [facility];  Service: ENT;  Laterality: N/A;   ORIF HUMERUS FRACTURE Right 05/05/2021   Procedure: reverse Hill-Sachs allografting, biceps tenodesis;  Surgeon: [name], MD;  Location: [facility];  Service: Orthopedics;  Laterality: Right;   SEPTOPLASTY N/A 08/31/2017   Procedure: SEPTOPLASTY;  Surgeon: Clyde Canterbury, MD;  Location: [place];  Service: ENT;  Laterality: N/A;   SHOULDER ARTHROSCOPY WITH LABRAL REPAIR Left 04/30/2020   Procedure: LEFT SHOULDER POSTERIOR LABRAL REPAIR WITH ARTHROSCOPY, BICEPS TENDON RELEASE AND TENODESIS, OPEN ALLOGRAFT FOR REVERSE BANKART LESION;  Surgeon: [name], MD;  Location: [facility];  Service: Orthopedics;  Laterality: Left;   SHOULDER ARTHROSCOPY WITH LABRAL REPAIR Right 05/05/2021   Procedure: right shoulder arthroscopy, biceps release, posterior labral repair;;  Surgeon: [name], MD;  Location: [place];  Service: Orthopedics;  Laterality: Right;   TONSILLECTOMY     and addenoids    Family History  Problem Relation Age of Onset   Cancer Mother        breat   Protein S deficiency Mother     Social History   Socioeconomic History   Marital status: Significant Other    Spouse name: Not on file   Number of children: Not on file   Years of education: Not on file   Highest education level: Not on file  Occupational History   Not on file  Tobacco Use   Smoking status: Never   Smokeless tobacco: Never  Vaping Use   Vaping Use: Never used  Substance and Sexual Activity   Alcohol use: No   Drug use: No   Sexual activity: Never  Other Topics Concern   Not on file  Social History Narrative   Right handed   Social Determinants of Health   Financial Resource Strain: Not on file  Food Insecurity: Not on file  Transportation Needs: Not on file  Physical Activity: Not on file  Stress: Not on file  Social Connections:  Not on file  Intimate Partner Violence: Not on file    Outpatient Medications Prior to Visit  Medication Sig Dispense Refill   cetirizine (ZYRTEC) 5 MG tablet Take 5 mg by mouth at bedtime.     cyanocobalamin (,VITAMIN B-12,) 1000 MCG/ML injection Inject 1 mL (1,000 mcg total) into the muscle every 30 (thirty) days. 1 mL 0   EPINEPHrine 0.3 mg/0.3 mL IJ SOAJ injection Inject 0.3 mg into the muscle as needed for anaphylaxis. 1 each 0   KEPPRA 1000 MG tablet Take 2 tablets (2,000 mg total) by mouth 2 (two) times daily. 120 tablet 0   lidocaine (LIDODERM) 5 % Place 1 patch onto the skin daily. Remove & Discard patch within 12 hours or as directed by  MD (Patient taking differently: Place 1 patch onto the skin daily as needed (pain). Remove & Discard patch within 12 hours or as directed by MD) 30 patch 0   clonazePAM (KLONOPIN) 0.5 MG tablet Take 1 tablet (0.5 mg total) by mouth 2 (two) times daily. 60 tablet 5   promethazine (PHENERGAN) 25 MG tablet Take 1 tablet (25 mg total) by mouth every 8 (eight) hours as needed for nausea or vomiting. 10 tablet 0   topiramate (TOPAMAX) 200 MG tablet Take 1 tablet (200 mg total) by mouth 2 (two) times daily. 180 tablet 3   No facility-administered medications prior to visit.    Allergies  Allergen Reactions   Lortab [Hydrocodone-Acetaminophen] Hives   Other Anaphylaxis    Peanuts   Zofran [Ondansetron Hcl] Nausea And Vomiting   Citrus Other (See Comments)    Sores in mouth   Dairy Aid [Tilactase] Nausea And Vomiting   Influenza Virus Vaccine Other (See Comments)    Partial paralysis for a couple of weeks per mom   Soy Allergy Other (See Comments)    Seizures    Hydrocodone-Acetaminophen Rash    ROS Review of Systems  Constitutional:  Positive for fatigue. Negative for chills and fever.  Respiratory:  Negative for cough and shortness of breath.   Cardiovascular:  Negative for chest pain.  Gastrointestinal:  Positive for nausea and vomiting.  Negative for blood in stool and diarrhea.  Genitourinary:  Negative for dysuria.  Neurological:  Negative for syncope and headaches.  Psychiatric/Behavioral:  Positive for dysphoric mood.      Objective:    Physical Exam Vitals reviewed.  Cardiovascular:     Rate and Rhythm: Normal rate and regular rhythm.  Pulmonary:     Effort: Pulmonary effort is normal.     Breath sounds: Normal breath sounds.  Abdominal:     Palpations: Abdomen is soft. There is no mass.     Tenderness: There is no abdominal tenderness.  Musculoskeletal:     Right lower leg: No edema.     Left lower leg: No edema.  Neurological:     General: No focal deficit present.     Mental Status: He is alert and oriented to person, place, and time.     Cranial Nerves: No cranial nerve deficit.     Motor: No weakness.  Psychiatric:     Comments: Patient is not very interactive.  He did make good eye contact and was appropriate initially prior to mom entering room but then he seemed to withdrawal more.    BP 122/62 (BP Location: Left Arm, Patient Position: Sitting, Cuff Size: Normal)    Pulse 82    Temp 98.6 F (37 C) (Oral)    Wt 121 lb 14.4 oz (55.3 kg)    SpO2 97%    BMI 16.53 kg/m  Wt Readings from Last 3 Encounters:  07/16/21 121 lb 14.4 oz (55.3 kg)  07/06/21 121 lb (54.9 kg)  07/01/21 121 lb 9.6 oz (55.2 kg)     Health Maintenance Due  Topic Date Due   Pneumococcal Vaccine 19-64 Years old (1 - PCV) 07/02/2006   COVID-19 Vaccine (2 - Booster for Janssen series) 02/25/2020   INFLUENZA VACCINE  02/24/2021    There are no preventive care reminders to display for this patient.  Lab Results  Component Value Date   TSH 1.729 06/22/2021   Lab Results  Component Value Date   WBC 3.5 (L) 07/06/2021   HGB 13.5 07/06/2021  HCT 39.8 07/06/2021   MCV 84.7 07/06/2021   PLT 224 07/06/2021   Lab Results  Component Value Date   NA 138 07/06/2021   K 3.6 07/06/2021   CO2 23 07/06/2021   GLUCOSE 91  07/06/2021   BUN 16 07/06/2021   CREATININE 0.96 07/06/2021   BILITOT 0.7 06/20/2021   ALKPHOS 62 06/20/2021   AST 33 06/20/2021   ALT 28 06/20/2021   PROT 6.4 (L) 06/20/2021   ALBUMIN 3.9 06/20/2021   CALCIUM 9.5 07/06/2021   ANIONGAP 6 07/06/2021   Lab Results  Component Value Date   CHOL 125 03/12/2020   Lab Results  Component Value Date   HDL 49 03/12/2020   Lab Results  Component Value Date   LDLCALC 63 03/12/2020   Lab Results  Component Value Date   TRIG 51 03/12/2020   Lab Results  Component Value Date   CHOLHDL 2.6 03/12/2020   No results found for: HGBA1C    Assessment & Plan:   #1 recent acute encephalopathy-working diagnosis was extended postictal phase as patient had low levels of Keppra and Topamax on admission and negative urine drug screen with no benzodiazepines suggesting no recent clonazepam use.  EEG did not show any obvious seizure activity.  He had extensive work-up as outlined above which was otherwise unrevealing -We will send note to neurology to see if they can work on getting him in sooner.  He has scheduled follow-up at this point in February.  #2 prolonged fatigue.  Etiology unclear.  Did have recent B12 deficiency but not clear if this explains some of his fatigue issues.  Recent TSH normal.    #3 B12 deficiency.  He does not have risk for dietary deficiency.  No PPI use.  We explained that autoimmune causes would be less likely in blacks but definitely possible.  He had initial normal intrinsic factor antibody and subsequent one came back elevated.  We discussed other category of B12 deficiency including malabsorption.  No prior abdominal surgery.  We mentioned other etiologies such as Crohn's disease-which are probably not extremely likely but on the other hand he has had poor appetite and some intermittent abdominal cramping and reported recent recurrent vomiting -Consider upper GI with small bowel follow-through to further assess  #4  recent elevated total CK.  This was trending down at the time of discharge.  We explained this could have been up related to potential recent seizure  #5 recent left upper lobe pneumonia probably related to aspiration.  No cough or persistent respiratory symptoms at this time.  Meds ordered this encounter  Medications   promethazine (PHENERGAN) 25 MG tablet    Sig: Take 1 tablet (25 mg total) by mouth every 8 (eight) hours as needed for nausea or vomiting.    Dispense:  10 tablet    Refill:  0    Follow-up: No follow-ups on file.    Carolann Littler, MD

## 2021-07-16 NOTE — Telephone Encounter (Signed)
1. Which medications need refilled? (List name and dosage, if known) klonopin, topamax (brand name only), and Keppra (brand name only)  2. Which pharmacy/location is medication to be sent to? (include street and city if local pharmacy) [place]

## 2021-07-16 NOTE — Patient Instructions (Signed)
Will set up UGI with small bowel follow through   Will talk to Dr Delice Lesch about follow up.

## 2021-07-16 NOTE — Telephone Encounter (Signed)
Refill sent in for pt. 

## 2021-07-17 ENCOUNTER — Telehealth: Payer: Self-pay

## 2021-07-17 NOTE — Telephone Encounter (Signed)
New message   Fax sent to the plan Your PA has been faxed to the plan as a paper copy. Please contact the plan directly if you haven't received a determination in a typical timeframe.  You will be notified of the determination via fax.  Terrence Riley ([name]) - [phone] Need help? Call Terrence Riley at [phone] Status Sent to [employer] The plan will fax you a determination, typically within 1 to 5 business days.  How do I follow up? Drug Keppra 1000MG  tablets Form [employer] Prior Authorization Request Form Prior Authorization for [employer] members [phone] [phone] Original Claim Info 70 TRANS FEE = 0.00BRAND ONLY COVERED IF MEDICALLY NECESSARY, PLEASE SUBMIT PA FOR REVIEW

## 2021-07-17 NOTE — Progress Notes (Signed)
Per orders of Dr. Elease Hashimoto, injection of B12 given by Rebecca Eaton. Patient tolerated injection well.

## 2021-07-18 ENCOUNTER — Emergency Department (HOSPITAL_COMMUNITY)
Admission: EM | Admit: 2021-07-18 | Discharge: 2021-07-18 | Discharge status: Against Medical Advice | Payer: No Typology Code available for payment source | Attending: Emergency Medicine | Admitting: Emergency Medicine

## 2021-07-18 ENCOUNTER — Encounter: Payer: Self-pay | Admitting: Family Medicine

## 2021-07-18 DIAGNOSIS — R1030 Lower abdominal pain, unspecified: Secondary | ICD-10-CM | POA: Insufficient documentation

## 2021-07-18 DIAGNOSIS — J4520 Mild intermittent asthma, uncomplicated: Secondary | ICD-10-CM | POA: Diagnosis not present

## 2021-07-18 LAB — URINALYSIS, ROUTINE W REFLEX MICROSCOPIC
Bilirubin Urine: NEGATIVE
Glucose, UA: NEGATIVE mg/dL
Hgb urine dipstick: NEGATIVE
Ketones, ur: 5 mg/dL — AB
Leukocytes,Ua: NEGATIVE
Nitrite: NEGATIVE
Protein, ur: 100 mg/dL — AB
Specific Gravity, Urine: 1.03 (ref 1.005–1.030)
pH: 6 (ref 5.0–8.0)

## 2021-07-18 LAB — COMPREHENSIVE METABOLIC PANEL
ALT: 29 U/L (ref 0–44)
AST: 24 U/L (ref 15–41)
Albumin: 4.7 g/dL (ref 3.5–5.0)
Alkaline Phosphatase: 81 U/L (ref 38–126)
Anion gap: 10 (ref 5–15)
BUN: 12 mg/dL (ref 6–20)
CO2: 22 mmol/L (ref 22–32)
Calcium: 9.4 mg/dL (ref 8.9–10.3)
Chloride: 106 mmol/L (ref 98–111)
Creatinine, Ser: 1.08 mg/dL (ref 0.61–1.24)
GFR, Estimated: >60 mL/min (ref >60)
Glucose, Bld: 90 mg/dL (ref 70–99)
Potassium: 3.4 mmol/L — ABNORMAL LOW (ref 3.5–5.1)
Sodium: 138 mmol/L (ref 135–145)
Total Bilirubin: 0.6 mg/dL (ref 0.3–1.2)
Total Protein: 7.3 g/dL (ref 6.5–8.1)

## 2021-07-18 LAB — CBC WITH DIFFERENTIAL/PLATELET
Abs Immature Granulocytes: 0.01 10*3/uL (ref 0.00–0.07)
Basophils Absolute: 0.1 10*3/uL (ref 0.0–0.1)
Basophils Relative: 2 %
Eosinophils Absolute: 0.2 10*3/uL (ref 0.0–0.5)
Eosinophils Relative: 4 %
HCT: 43.6 % (ref 39.0–52.0)
Hemoglobin: 14.9 g/dL (ref 13.0–17.0)
Immature Granulocytes: 0 %
Lymphocytes Relative: 40 %
Lymphs Abs: 1.9 10*3/uL (ref 0.7–4.0)
MCH: 29.2 pg (ref 26.0–34.0)
MCHC: 34.2 g/dL (ref 30.0–36.0)
MCV: 85.3 fL (ref 80.0–100.0)
Monocytes Absolute: 0.4 10*3/uL (ref 0.1–1.0)
Monocytes Relative: 9 %
Neutro Abs: 2.2 10*3/uL (ref 1.7–7.7)
Neutrophils Relative %: 45 %
Platelets: 184 10*3/uL (ref 150–400)
RBC: 5.11 MIL/uL (ref 4.22–5.81)
RDW: 13.4 % (ref 11.5–15.5)
WBC: 4.8 10*3/uL (ref 4.0–10.5)
nRBC: 0 % (ref 0.0–0.2)

## 2021-07-18 LAB — LACTIC ACID, PLASMA: Lactic Acid, Venous: 1.2 mmol/L (ref 0.5–1.9)

## 2021-07-18 LAB — CK: Total CK: 134 U/L (ref 49–397)

## 2021-07-18 LAB — LIPASE, BLOOD: Lipase: 27 U/L (ref 11–51)

## 2021-07-18 MED ORDER — HYOSCYAMINE SULFATE 0.125 MG PO TABS: 0.1250 mg | ORAL_TABLET | Freq: Once | ORAL | Status: DC

## 2021-07-18 MED ORDER — KETOROLAC TROMETHAMINE 60 MG/2ML IM SOLN
30.0000 mg | Freq: Once | INTRAMUSCULAR | Status: AC
  Administered 2021-07-18: 02:00:00 30 mg via INTRAMUSCULAR
  Filled 2021-07-18: qty 2

## 2021-07-18 MED ORDER — HYOSCYAMINE SULFATE 0.125 MG SL SUBL
0.1250 mg | SUBLINGUAL_TABLET | Freq: Once | SUBLINGUAL | Status: AC
  Administered 2021-07-18: 02:00:00 0.125 mg via ORAL
  Filled 2021-07-18: qty 1

## 2021-07-18 NOTE — ED Triage Notes (Signed)
Pt came in with c/o lower abdominal pain that started suddenly around 2300. Pt took oxycodone and phenergan before he came. Pt denies diarrhea

## 2021-07-18 NOTE — ED Provider Notes (Signed)
[facility] DEPT Provider Note  CSN: [id number] Arrival date & time: 07/18/21 1700  Chief Complaint(s) Abdominal Pain  HPI Terrence Riley is a 20 y.o. male here for 1 to 1-1/2 hours of sudden onset lower abdominal pain. Moderate to severe. Cramping/stabbing. Nonradiating. No alleviating or aggravating factors. No suspicious food intake. Some nausea without emesis. No diarrhea No dysuria.  No testicular pain.   Patient had a recent admission to the hospital for altered mental status/encephalopathy.  Noted to be vitamin B12 deficient concerning for pernicious anemia.  Also noted to have aspiration pneumonia that was treated with antibiotics in the hospital.  Discharged 3 weeks ago.  Has been doing well since then. Mother reports that patient's CK levels have been uptrending -they are unsure why.  No recent seizures.   Abdominal Pain  Past Medical History Past Medical History:  Diagnosis Date   Allergy    rhinitis   Asthma    as a child   Complication of anesthesia    pt. reports he need more anesthesia with the septoplasty surgery   Eosinophilic esophagitis    heartburn and reflux   Family history of adverse reaction to anesthesia    mother respiratory failure   Headache    Nasal fracture    deviated septum   Pneumonia    1x history of   Premature baby    2 months early   Seizures ([place])    well controlled on meds/ one 2 months ago when meds were late   Patient Active Problem List   Diagnosis Date Noted   Hypokalemia 06/26/2021   B12 deficiency 06/26/2021   Rhabdomyolysis 06/26/2021   Acute encephalopathy 06/20/2021   Aspiration pneumonia ([place]) 06/20/2021   Febrile illness    Shoulder instability, right    Humeral head fracture, right, with malunion, subsequent encounter    Esophagitis determined by endoscopy 07/24/2020   Labral tear of shoulder, left, subsequent encounter 02/13/2020   Shoulder dislocation, left, subsequent  encounter 02/13/2020   Seizure disorder ([place]) 12/22/2019   Migraines 12/22/2019   Mild intermittent asthma 12/22/2019   Migraine without aura and without status migrainosus, not intractable 06/29/2019   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   Altered mental status 03/12/2014   Lethargic 03/09/2014   Environmental allergies 03/01/2014   Gastroesophageal reflux disease with esophagitis 10/06/2013   Localization-related (focal) (partial) symptomatic epilepsy and epileptic syndromes with complex partial seizures, not intractable, without status epilepticus ([place]) 10/06/2013   Spells 07/26/2013   Allergy to other foods 07/13/2013   Prophylactic immunotherapy 07/13/2013   Unspecified asthma, uncomplicated 17/49/4496   Eosinophilic esophagitis 75/91/6384   Gastroesophageal reflux 01/23/2013   Vomiting 01/23/2013   Leukopenia 10/19/2012   Muscle jerks during sleep 10/19/2012   Constipation 08/20/2012   Neutropenia ([place]) 08/20/2012   Allergic rhinitis due to pollen 02/19/2011   Home Medication(s) Prior to Admission medications   Medication Sig Start Date End Date Taking? Authorizing Provider  cetirizine (ZYRTEC) 5 MG tablet Take 5 mg by mouth at bedtime.    [provider]  clonazePAM (KLONOPIN) 0.5 MG tablet Take 1 tablet (0.5 mg total) by mouth 2 (two) times daily. 07/16/21   Cameron Sprang, MD  cyanocobalamin (,VITAMIN B-12,) 1000 MCG/ML injection Inject 1 mL (1,000 mcg total) into the muscle every 30 (thirty) days. 06/27/21   [name], MD  EPINEPHrine 0.3 mg/0.3 mL IJ SOAJ injection Inject 0.3 mg into the muscle as needed for anaphylaxis. 07/10/20   McDonald, Laymond Purser,  PA-C  KEPPRA 1000 MG tablet Take 2 tablets (2,000 mg total) by mouth 2 (two) times daily. 07/16/21   Cameron Sprang, MD  lidocaine (LIDODERM) 5 % Place 1 patch onto the skin daily. Remove & Discard patch within 12 hours or as directed by MD Patient taking differently: Place 1 patch onto the skin daily as needed  (pain). Remove & Discard patch within 12 hours or as directed by MD 02/25/20   Henderly, Britni A, PA-C  promethazine (PHENERGAN) 25 MG tablet Take 1 tablet (25 mg total) by mouth every 8 (eight) hours as needed for nausea or vomiting. 07/16/21   Burchette, Alinda Sierras, MD  topiramate (TOPAMAX) 200 MG tablet Take 1 tablet (200 mg total) by mouth 2 (two) times daily. 07/16/21   Cameron Sprang, MD                                                                                                                                    Past Surgical History Past Surgical History:  Procedure Laterality Date   ADENOIDECTOMY     CLOSED REDUCTION NASAL FRACTURE N/A 08/31/2017   Procedure: CLOSED REDUCTION NASAL FRACTURE;  Surgeon: Clyde Canterbury, MD;  Location: [facility];  Service: ENT;  Laterality: N/A;   ORIF HUMERUS FRACTURE Right 05/05/2021   Procedure: reverse Hill-Sachs allografting, biceps tenodesis;  Surgeon: [name], MD;  Location: [facility];  Service: Orthopedics;  Laterality: Right;   SEPTOPLASTY N/A 08/31/2017   Procedure: SEPTOPLASTY;  Surgeon: Clyde Canterbury, MD;  Location: [facility];  Service: ENT;  Laterality: N/A;   SHOULDER ARTHROSCOPY WITH LABRAL REPAIR Left 04/30/2020   Procedure: LEFT SHOULDER POSTERIOR LABRAL REPAIR WITH ARTHROSCOPY, BICEPS TENDON RELEASE AND TENODESIS, OPEN ALLOGRAFT FOR REVERSE BANKART LESION;  Surgeon: [name], MD;  Location: [facility] [place];  Service: Orthopedics;  Laterality: Left;   SHOULDER ARTHROSCOPY WITH LABRAL REPAIR Right 05/05/2021   Procedure: right shoulder arthroscopy, biceps release, posterior labral repair;;  Surgeon: [name], MD;  Location: [facility];  Service: Orthopedics;  Laterality: Right;   TONSILLECTOMY     and addenoids   Family History Family History  Problem Relation Age of Onset   Cancer Mother        breat   Protein S deficiency Mother     Social History Social History   Tobacco Use   Smoking status: Never    Smokeless tobacco: Never  Vaping Use   Vaping Use: Never used  Substance Use Topics   Alcohol use: No   Drug use: No   Allergies Lortab [hydrocodone-acetaminophen], Other, Zofran [ondansetron hcl], Citrus, Dairy aid [tilactase], Influenza virus vaccine, Soy allergy, and Hydrocodone-acetaminophen  Review of Systems Review of Systems  Gastrointestinal:  Positive for abdominal pain.  All other systems are reviewed and are negative for acute change except as noted in the HPI  Physical Exam Vital Signs  I have reviewed the  triage vital signs BP 126/73 (BP Location: Right Arm)    Pulse 97    Temp 99.1 F (37.3 C) (Oral)    Resp 20    Ht 6' (1.829 m)    Wt 59 kg    SpO2 97%    BMI 17.63 kg/m   Physical Exam Vitals reviewed.  Constitutional:      General: He is not in acute distress.    Appearance: He is well-developed. He is not diaphoretic.  HENT:     Head: Normocephalic and atraumatic.     Right Ear: External ear normal.     Left Ear: External ear normal.     Nose: Nose normal.     Mouth/Throat:     Mouth: Mucous membranes are moist.  Eyes:     General: No scleral icterus.    Conjunctiva/sclera: Conjunctivae normal.  Neck:     Trachea: Phonation normal.  Cardiovascular:     Rate and Rhythm: Normal rate and regular rhythm.  Pulmonary:     Effort: Pulmonary effort is normal. No respiratory distress.     Breath sounds: No stridor.  Abdominal:     General: There is no distension.     Tenderness: There is abdominal tenderness in the right lower quadrant, suprapubic area and left lower quadrant. There is no guarding or rebound. Negative signs include Murphy's sign and McBurney's sign.  Musculoskeletal:        General: Normal range of motion.     Cervical back: Normal range of motion.  Neurological:     Mental Status: He is alert and oriented to person, place, and time.  Psychiatric:        Behavior: Behavior normal.    ED Results and Treatments Labs (all labs ordered  are listed, but only abnormal results are displayed) Labs Reviewed  COMPREHENSIVE METABOLIC PANEL - Abnormal; Notable for the following components:      Result Value   Potassium 3.4 (*)    All other components within normal limits  CBC WITH DIFFERENTIAL/PLATELET  LIPASE, BLOOD  LACTIC ACID, PLASMA  CK  URINALYSIS, ROUTINE W REFLEX MICROSCOPIC  I-STAT CHEM 8, ED                                                                                                                         EKG  EKG Interpretation  Date/Time:    Ventricular Rate:    PR Interval:    QRS Duration:   QT Interval:    QTC Calculation:   R Axis:     Text Interpretation:         Radiology No results found.  Pertinent labs & imaging results that were available during my care of the patient were reviewed by me and considered in my medical decision making (see MDM for details).  Medications Ordered in ED Medications  ketorolac (TORADOL) injection 30 mg (30 mg Intramuscular Given 07/18/21 0156)  hyoscyamine (LEVSIN SL) SL tablet 0.125 mg (0.125 mg  Oral Given 07/18/21 0159)                                                                                                                                     Procedures Procedures  (including critical care time)  Medical Decision Making / ED Course I have reviewed the nursing notes for this encounter and the patient's prior records (if available in EHR or on provided paperwork).  GILLIAM HAWKES was evaluated in Emergency Department on 07/18/2021 for the symptoms described in the history of present illness. He was evaluated in the context of the global COVID-19 pandemic, which necessitated consideration that the patient might be at risk for infection with the SARS-CoV-2 virus that causes COVID-19. Institutional protocols and algorithms that pertain to the evaluation of patients at risk for COVID-19 are in a state of rapid change based on information released by  regulatory bodies including the CDC and federal and state organizations. These policies and algorithms were followed during the patient's care in the ED.     Lower abdominal pain. Sudden onset. On review of records recent CT of the abdomen was negative for any renal stones. Presentation is not classic for inflammatory/infectious process such as appendicitis. No testicular pain concerning for torsion.  Will get screening labs and provide patient with symptomatic meds.  Pertinent labs & imaging results that were available during my care of the patient were reviewed by me and considered in my medical decision making:  CBC without leukocytosis or anemia.  No significant electrolyte derangements or renal sufficiency.  No evidence of bili obstruction or pancreatitis.  Due to lack of staff, patient was placed  In the waiting room to await results.  UA still pending.  On reassessment still has not provided a urine but felt better.  Instructed to let us know when he was ready to provide urine.  He was given a cup and placed back into the waiting room with his mother..  6:06 AM I went to reassess the patient and he and the mother had left.  Final Clinical Impression(s) / ED Diagnoses Final diagnoses:  Lower abdominal pain     This chart was dictated using voice recognition software.  Despite best efforts to proofread,  errors can occur which can change the documentation meaning.    Fatima Blank, MD 07/18/21 [id number]

## 2021-07-18 NOTE — ED Notes (Signed)
Pt turned in his stickers at 0558 and elected to leave

## 2021-07-21 ENCOUNTER — Encounter: Payer: Self-pay | Admitting: Orthopedic Surgery

## 2021-07-21 NOTE — Progress Notes (Signed)
Post-Op Visit Note   Patient: Terrence Riley           Date of Birth: 2000-12-19           MRN: [id number] Visit Date: 07/16/2021 PCP: Eulas Post, MD   Assessment & Plan:  Chief Complaint:  Chief Complaint  Patient presents with   Right Shoulder - Routine Post Op   Visit Diagnoses:  1. S/P arthroscopy of right shoulder     Plan: Patient presents for follow-up right shoulder arthroscopy with posterior labral repair and open reverse Hill-Sachs lesion bone grafting with biceps tenodesis.  Patient is doing his own physical therapy.  States that shoulder feels good.  Has been doing some shooting around but no playing yet.  Had similar procedure done on the left shoulder.  On examination he is got a stable shoulder.  Rotator cuff strength good with 5- out of 5 subscap strength on the right.  Plan is physical therapy here for range of motion and strengthening.  No crossed arm adduction and no posterior capsular stretching.  2 times a week for 4 weeks.  Follow-up with me in 8 weeks.  I think is okay for him to do some shooting and dribbling and recreational play beginning at the earliest in the January which would be 3 months out after procedure.  Follow-Up Instructions: No follow-ups on file.   Orders:  Orders Placed This Encounter  Procedures   Ambulatory referral to Physical Therapy   No orders of the defined types were placed in this encounter.   Imaging: No results found.  PMFS History: Patient Active Problem List   Diagnosis Date Noted   Hypokalemia 06/26/2021   B12 deficiency 06/26/2021   Rhabdomyolysis 06/26/2021   Acute encephalopathy 06/20/2021   Aspiration pneumonia ([place]) 06/20/2021   Febrile illness    Shoulder instability, right    Humeral head fracture, right, with malunion, subsequent encounter    Esophagitis determined by endoscopy 07/24/2020   Labral tear of shoulder, left, subsequent encounter 02/13/2020   Shoulder dislocation, left, subsequent  encounter 02/13/2020   Seizure disorder ([place]) 12/22/2019   Migraines 12/22/2019   Mild intermittent asthma 12/22/2019   Migraine without aura and without status migrainosus, not intractable 06/29/2019   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   Altered mental status 03/12/2014   Lethargic 03/09/2014   Environmental allergies 03/01/2014   Gastroesophageal reflux disease with esophagitis 10/06/2013   Localization-related (focal) (partial) symptomatic epilepsy and epileptic syndromes with complex partial seizures, not intractable, without status epilepticus ([place]) 10/06/2013   Spells 07/26/2013   Allergy to other foods 07/13/2013   Prophylactic immunotherapy 07/13/2013   Unspecified asthma, uncomplicated 64/33/2951   Eosinophilic esophagitis 88/41/6606   Gastroesophageal reflux 01/23/2013   Vomiting 01/23/2013   Leukopenia 10/19/2012   Muscle jerks during sleep 10/19/2012   Constipation 08/20/2012   Neutropenia ([place]) 08/20/2012   Allergic rhinitis due to pollen 02/19/2011   Past Medical History:  Diagnosis Date   Allergy    rhinitis   Asthma    as a child   Complication of anesthesia    pt. reports he need more anesthesia with the septoplasty surgery   Eosinophilic esophagitis    heartburn and reflux   Family history of adverse reaction to anesthesia    mother respiratory failure   Headache    Nasal fracture    deviated septum   Pneumonia    1x history of   Premature baby    2 months early   Seizures (  [place])    well controlled on meds/ one 2 months ago when meds were late    Family History  Problem Relation Age of Onset   Cancer Mother        breat   Protein S deficiency Mother     Past Surgical History:  Procedure Laterality Date   ADENOIDECTOMY     CLOSED REDUCTION NASAL FRACTURE N/A 08/31/2017   Procedure: CLOSED REDUCTION NASAL FRACTURE;  Surgeon: Clyde Canterbury, MD;  Location: [facility];  Service: ENT;  Laterality: N/A;   ORIF HUMERUS FRACTURE Right 05/05/2021    Procedure: reverse Hill-Sachs allografting, biceps tenodesis;  Surgeon: [name], MD;  Location: [facility];  Service: Orthopedics;  Laterality: Right;   SEPTOPLASTY N/A 08/31/2017   Procedure: SEPTOPLASTY;  Surgeon: Clyde Canterbury, MD;  Location: [facility];  Service: ENT;  Laterality: N/A;   SHOULDER ARTHROSCOPY WITH LABRAL REPAIR Left 04/30/2020   Procedure: LEFT SHOULDER POSTERIOR LABRAL REPAIR WITH ARTHROSCOPY, BICEPS TENDON RELEASE AND TENODESIS, OPEN ALLOGRAFT FOR REVERSE BANKART LESION;  Surgeon: [name], MD;  Location: [facility];  Service: Orthopedics;  Laterality: Left;   SHOULDER ARTHROSCOPY WITH LABRAL REPAIR Right 05/05/2021   Procedure: right shoulder arthroscopy, biceps release, posterior labral repair;;  Surgeon: [name], MD;  Location: [facility];  Service: Orthopedics;  Laterality: Right;   TONSILLECTOMY     and addenoids   Social History   Occupational History   Not on file  Tobacco Use   Smoking status: Never   Smokeless tobacco: Never  Vaping Use   Vaping Use: Never used  Substance and Sexual Activity   Alcohol use: No   Drug use: No   Sexual activity: Never

## 2021-07-22 ENCOUNTER — Telehealth: Payer: Self-pay | Admitting: *Deleted

## 2021-07-22 NOTE — Telephone Encounter (Signed)
Transition Care Management Follow-up Telephone Call Date of discharge and from where: 06/27/21 [facility] How have you been since you were released from the hospital? "Still feel tired and weak but otherwise OK"  Any questions or concerns? No  Items Reviewed: Did the pt receive and understand the discharge instructions provided? Yes  Medications obtained and verified? Yes  Other? No  Any new allergies since your discharge? No  Dietary orders reviewed? Yes Do you have support at home? No   Home Care and Equipment/Supplies: Were home health services ordered? not applicable If so, what is the name of the agency? Not applicable  Has the agency set up a time to come to the patient's home? not applicable Were any new equipment or medical supplies ordered?  No What is the name of the medical supply agency? Not applicable Were you able to get the supplies/equipment? not applicable Do you have any questions related to the use of the equipment or supplies? Not applicable  Functional Questionnaire: (I = Independent and D = Dependent) ADLs: I  Bathing/Dressing- I  Meal Prep- I  Eating- I  Maintaining continence- I  Transferring/Ambulation- I  Managing Meds- I  Follow up appointments reviewed:  PCP Hospital f/u appt confirmed? Yes  Scheduled to see Dr Elease Hashimoto on 07/16/21  @ 4:00 pm- completed. [facility] f/u appt confirmed? Yes  Scheduled to see Dr Delice Lesch on 09/18/21 @ 11:30 am. Are transportation arrangements needed? No  If their condition worsens, is the pt aware to call PCP or go to the Emergency Dept.? Yes Was the patient provided with contact information for the PCP's office or ED? Yes Was to pt encouraged to call back with questions or concerns? Yes  Kelli Churn RN, CCM, [facility] Care Management Coordinator - Managed [place] [facility] [phone]

## 2021-07-24 ENCOUNTER — Encounter (HOSPITAL_COMMUNITY): Payer: Self-pay

## 2021-07-24 ENCOUNTER — Emergency Department (HOSPITAL_COMMUNITY)
Admission: EM | Admit: 2021-07-24 | Discharge: 2021-07-24 | Disposition: A | Discharge status: Home / Self Care | Payer: No Typology Code available for payment source | Attending: Emergency Medicine | Admitting: Emergency Medicine

## 2021-07-24 ENCOUNTER — Other Ambulatory Visit: Payer: Self-pay

## 2021-07-24 DIAGNOSIS — Z20822 Contact with and (suspected) exposure to covid-19: Secondary | ICD-10-CM | POA: Diagnosis not present

## 2021-07-24 DIAGNOSIS — J4520 Mild intermittent asthma, uncomplicated: Secondary | ICD-10-CM | POA: Insufficient documentation

## 2021-07-24 DIAGNOSIS — Y900 Blood alcohol level of less than 20 mg/100 ml: Secondary | ICD-10-CM | POA: Diagnosis not present

## 2021-07-24 DIAGNOSIS — F99 Mental disorder, not otherwise specified: Secondary | ICD-10-CM | POA: Diagnosis present

## 2021-07-24 DIAGNOSIS — F489 Nonpsychotic mental disorder, unspecified: Secondary | ICD-10-CM

## 2021-07-24 DIAGNOSIS — R45851 Suicidal ideations: Secondary | ICD-10-CM | POA: Diagnosis not present

## 2021-07-24 LAB — COMPREHENSIVE METABOLIC PANEL
ALT: 38 U/L (ref 0–44)
AST: 59 U/L — ABNORMAL HIGH (ref 15–41)
Albumin: 4.5 g/dL (ref 3.5–5.0)
Alkaline Phosphatase: 83 U/L (ref 38–126)
Anion gap: 7 (ref 5–15)
BUN: 11 mg/dL (ref 6–20)
CO2: 22 mmol/L (ref 22–32)
Calcium: 8.8 mg/dL — ABNORMAL LOW (ref 8.9–10.3)
Chloride: 111 mmol/L (ref 98–111)
Creatinine, Ser: 0.93 mg/dL (ref 0.61–1.24)
GFR, Estimated: >60 mL/min (ref >60)
Glucose, Bld: 81 mg/dL (ref 70–99)
Potassium: 3.1 mmol/L — ABNORMAL LOW (ref 3.5–5.1)
Sodium: 140 mmol/L (ref 135–145)
Total Bilirubin: 0.9 mg/dL (ref 0.3–1.2)
Total Protein: 7 g/dL (ref 6.5–8.1)

## 2021-07-24 LAB — CBC
HCT: 39.2 % (ref 39.0–52.0)
Hemoglobin: 13.5 g/dL (ref 13.0–17.0)
MCH: 29.4 pg (ref 26.0–34.0)
MCHC: 34.4 g/dL (ref 30.0–36.0)
MCV: 85.4 fL (ref 80.0–100.0)
Platelets: 163 10*3/uL (ref 150–400)
RBC: 4.59 MIL/uL (ref 4.22–5.81)
RDW: 13.7 % (ref 11.5–15.5)
WBC: 6.1 10*3/uL (ref 4.0–10.5)
nRBC: 0 % (ref 0.0–0.2)

## 2021-07-24 LAB — ETHANOL: Alcohol, Ethyl (B): <10 mg/dL (ref <10)

## 2021-07-24 LAB — RAPID URINE DRUG SCREEN, HOSP PERFORMED
Amphetamines: NOT DETECTED
Barbiturates: NOT DETECTED
Benzodiazepines: NOT DETECTED
Cocaine: NOT DETECTED
Opiates: NOT DETECTED
Tetrahydrocannabinol: NOT DETECTED

## 2021-07-24 LAB — ACETAMINOPHEN LEVEL: Acetaminophen (Tylenol), Serum: <10 ug/mL — ABNORMAL LOW (ref 10–30)

## 2021-07-24 LAB — RESP PANEL BY RT-PCR (FLU A&B, COVID) ARPGX2
Influenza A by PCR: NEGATIVE
Influenza B by PCR: NEGATIVE
SARS Coronavirus 2 by RT PCR: NEGATIVE

## 2021-07-24 LAB — SALICYLATE LEVEL: Salicylate Lvl: <7 mg/dL — ABNORMAL LOW (ref 7.0–30.0)

## 2021-07-24 NOTE — ED Triage Notes (Signed)
Pt reports "not being able to take life anymore". 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 Admits to thoughts of harming himself but no intention to acting due to positive reasons to live for. Seeking help.

## 2021-07-24 NOTE — ED Provider Notes (Signed)
[facility] DEPT Provider Note   CSN: [id number] Arrival date & time: 07/24/21  0024     History Chief Complaint  Patient presents with   Mental Health Problem    Terrence Riley is a 20 y.o. male.  The history is provided by the patient and medical records.  Mental Health Problem Presenting symptoms: suicidal thoughts    20 y.o. M with hx of asthma, headaches, seizures, presenting to the ED for psychiatric evaluation.  States he was hospitalized from October-December in a "coma", however per chart review it appears he was admitted in November for acute encephalopathy for 1 week.  States after that he seems to be having "medical issues" that keep arising that Is causing his stress.  He states his family is having a hard time with this as well as they "just don't seem to understand".  He also reports losing his job due to his medical issues and is causing financial burden.  He has had thoughts about harming himself intermittently but no specific plan voiced.  States he stops himself because he doesn't want to hurt his family further but he admits to needing help.  He does take narcotic pain medication for his shoulder but has been taking as prescribed.  Denies illicit drug or EtOH use.  Denies HI/AVH.  Past Medical History:  Diagnosis Date   Allergy    rhinitis   Asthma    as a child   Complication of anesthesia    pt. reports he need more anesthesia with the septoplasty surgery   Eosinophilic esophagitis    heartburn and reflux   Family history of adverse reaction to anesthesia    mother respiratory failure   Headache    Nasal fracture    deviated septum   Pneumonia    1x history of   Premature baby    2 months early   Seizures ([place])    well controlled on meds/ one 2 months ago when meds were late    Patient Active Problem List   Diagnosis Date Noted   Hypokalemia 06/26/2021   B12 deficiency 06/26/2021   Rhabdomyolysis 06/26/2021    Acute encephalopathy 06/20/2021   Aspiration pneumonia ([place]) 06/20/2021   Febrile illness    Shoulder instability, right    Humeral head fracture, right, with malunion, subsequent encounter    Esophagitis determined by endoscopy 07/24/2020   Labral tear of shoulder, left, subsequent encounter 02/13/2020   Shoulder dislocation, left, subsequent encounter 02/13/2020   Seizure disorder ([place]) 12/22/2019   Migraines 12/22/2019   Mild intermittent asthma 12/22/2019   Migraine without aura and without status migrainosus, not intractable 06/29/2019   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   Altered mental status 03/12/2014   Lethargic 03/09/2014   Environmental allergies 03/01/2014   Gastroesophageal reflux disease with esophagitis 10/06/2013   Localization-related (focal) (partial) symptomatic epilepsy and epileptic syndromes with complex partial seizures, not intractable, without status epilepticus ([place]) 10/06/2013   Spells 07/26/2013   Allergy to other foods 07/13/2013   Prophylactic immunotherapy 07/13/2013   Unspecified asthma, uncomplicated 23/76/2831   Eosinophilic esophagitis 51/76/1607   Gastroesophageal reflux 01/23/2013   Vomiting 01/23/2013   Leukopenia 10/19/2012   Muscle jerks during sleep 10/19/2012   Constipation 08/20/2012   Neutropenia ([place]) 08/20/2012   Allergic rhinitis due to pollen 02/19/2011    Past Surgical History:  Procedure Laterality Date   ADENOIDECTOMY     CLOSED REDUCTION NASAL FRACTURE N/A 08/31/2017   Procedure: CLOSED REDUCTION NASAL FRACTURE;  Surgeon: Clyde Canterbury, MD;  Location: [facility];  Service: ENT;  Laterality: N/A;   ORIF HUMERUS FRACTURE Right 05/05/2021   Procedure: reverse Hill-Sachs allografting, biceps tenodesis;  Surgeon: [name], MD;  Location: [facility];  Service: Orthopedics;  Laterality: Right;   SEPTOPLASTY N/A 08/31/2017   Procedure: SEPTOPLASTY;  Surgeon: Clyde Canterbury, MD;  Location: [facility];  Service: ENT;   Laterality: N/A;   SHOULDER ARTHROSCOPY WITH LABRAL REPAIR Left 04/30/2020   Procedure: LEFT SHOULDER POSTERIOR LABRAL REPAIR WITH ARTHROSCOPY, BICEPS TENDON RELEASE AND TENODESIS, OPEN ALLOGRAFT FOR REVERSE BANKART LESION;  Surgeon: [name], MD;  Location: [facility];  Service: Orthopedics;  Laterality: Left;   SHOULDER ARTHROSCOPY WITH LABRAL REPAIR Right 05/05/2021   Procedure: right shoulder arthroscopy, biceps release, posterior labral repair;;  Surgeon: [name], MD;  Location: [facility];  Service: Orthopedics;  Laterality: Right;   TONSILLECTOMY     and addenoids       Family History  Problem Relation Age of Onset   Cancer Mother        breat   Protein S deficiency Mother     Social History   Tobacco Use   Smoking status: Never   Smokeless tobacco: Never  Vaping Use   Vaping Use: Never used  Substance Use Topics   Alcohol use: No   Drug use: No    Home Medications Prior to Admission medications   Medication Sig Start Date End Date Taking? Authorizing Provider  cetirizine (ZYRTEC) 5 MG tablet Take 5 mg by mouth at bedtime.    [provider]  clonazePAM (KLONOPIN) 0.5 MG tablet Take 1 tablet (0.5 mg total) by mouth 2 (two) times daily. 07/16/21   Cameron Sprang, MD  cyanocobalamin (,VITAMIN B-12,) 1000 MCG/ML injection Inject 1 mL (1,000 mcg total) into the muscle every 30 (thirty) days. 06/27/21   [name], MD  EPINEPHrine 0.3 mg/0.3 mL IJ SOAJ injection Inject 0.3 mg into the muscle as needed for anaphylaxis. 07/10/20   McDonald, Mia A, PA-C  KEPPRA 1000 MG tablet Take 2 tablets (2,000 mg total) by mouth 2 (two) times daily. 07/16/21   Cameron Sprang, MD  lidocaine (LIDODERM) 5 % Place 1 patch onto the skin daily. Remove & Discard patch within 12 hours or as directed by MD Patient taking differently: Place 1 patch onto the skin daily as needed (pain). Remove & Discard patch within 12 hours or as directed by MD 02/25/20   Henderly, Britni A,  PA-C  promethazine (PHENERGAN) 25 MG tablet Take 1 tablet (25 mg total) by mouth every 8 (eight) hours as needed for nausea or vomiting. 07/16/21   Burchette, Alinda Sierras, MD  topiramate (TOPAMAX) 200 MG tablet Take 1 tablet (200 mg total) by mouth 2 (two) times daily. 07/16/21   Cameron Sprang, MD    Allergies    Lortab [hydrocodone-acetaminophen], Other, Zofran [name] hcl], Citrus, Dairy aid [tilactase], Influenza virus vaccine, Soy allergy, and Hydrocodone-acetaminophen  Review of Systems   Review of Systems  Psychiatric/Behavioral:  Positive for suicidal ideas.        Depression  All other systems reviewed and are negative.  Physical Exam Updated Vital Signs BP (!) 141/74 (BP Location: Left Arm)    Pulse 85    Temp 98.1 F (36.7 C) (Oral)    Resp 16    Ht 6' (1.829 m)    Wt 54.9 kg    SpO2 99%  BMI 16.41 kg/m   Physical Exam Vitals and nursing note reviewed.  Constitutional:      Appearance: He is well-developed.  HENT:     Head: Normocephalic and atraumatic.  Eyes:     Conjunctiva/sclera: Conjunctivae normal.     Pupils: Pupils are equal, round, and reactive to light.  Cardiovascular:     Rate and Rhythm: Normal rate and regular rhythm.     Heart sounds: Normal heart sounds.  Pulmonary:     Effort: Pulmonary effort is normal.     Breath sounds: Normal breath sounds.  Abdominal:     General: Bowel sounds are normal.     Palpations: Abdomen is soft.  Musculoskeletal:        General: Normal range of motion.     Cervical back: Normal range of motion.  Skin:    General: Skin is warm and dry.  Neurological:     Mental Status: He is alert and oriented to person, place, and time.  Psychiatric:        Attention and Perception: He does not perceive auditory hallucinations.        Thought Content: Thought content includes suicidal ideation. Thought content does not include homicidal ideation. Thought content does not include homicidal or suicidal plan.     Comments:  Some passive thoughts of SI without plan voice, no active SI/HI/AVH voiced during exam    ED Results / Procedures / Treatments   Labs (all labs ordered are listed, but only abnormal results are displayed) Labs Reviewed  COMPREHENSIVE METABOLIC PANEL - Abnormal; Notable for the following components:      Result Value   Potassium 3.1 (*)    Calcium 8.8 (*)    AST 59 (*)    All other components within normal limits  SALICYLATE LEVEL - Abnormal; Notable for the following components:   Salicylate Lvl <0.8 (*)    All other components within normal limits  ACETAMINOPHEN LEVEL - Abnormal; Notable for the following components:   Acetaminophen (Tylenol), Serum <10 (*)    All other components within normal limits  RESP PANEL BY RT-PCR (FLU A&B, COVID) ARPGX2  ETHANOL  CBC  RAPID URINE DRUG SCREEN, HOSP PERFORMED    EKG None  Radiology No results found.  Procedures Procedures   Medications Ordered in ED Medications - No data to display  ED Course  I have reviewed the triage vital signs and the nursing notes.  Pertinent labs & imaging results that were available during my care of the patient were reviewed by me and considered in my medical decision making (see chart for details).    MDM Rules/Calculators/A&P  20 year old male presenting to the ED for psychiatric evaluation.  Admits to many stressors including health problems, financial worries, and family issues.  Admits to some thoughts of harming himself intermittently but no specific plan.  He denies any drug or alcohol abuse.  He denies any homicidal ideation or hallucinations.  His labs are overall reassuring.  We will plan for TTS consult.  2:43 AM Notified by RN that patient has now decided he does not want to stay for TTS evaluation.  States he does not feel like he will harm himself, more so just wanted help with depression and his recent stressors.  Currently he has no plan, appears to have insight, and does not appear to  be an imminent risk to himself or others at this time. I do not feel I have grounds for IVC at this time.  Patient will be discharged with OP resources and close follow-up at [facility].  He was given strict ED return precautions for any new/acute changes with his mental health.  Final Clinical Impression(s) / ED Diagnoses Final diagnoses:  Mental health problem    Rx / DC Orders ED Discharge Orders     None        Larene Pickett, PA-C 07/24/21 0248    Shanon Rosser, MD 07/24/21 [phone]

## 2021-07-24 NOTE — ED Notes (Signed)
Patient asking to leave. Provider made aware.

## 2021-07-24 NOTE — Discharge Instructions (Signed)
Recommend that you follow-up with [facility] center for ongoing psychiatric care. Return to the ED immediately if you begin to feel that you want to harm yourself or others, experience hallucinations, or other concerning symptoms.

## 2021-07-25 ENCOUNTER — Ambulatory Visit (INDEPENDENT_AMBULATORY_CARE_PROVIDER_SITE_OTHER): Payer: No Typology Code available for payment source | Admitting: Family Medicine

## 2021-07-25 VITALS — BP 100/60 | HR 75 | Temp 97.6°F | Wt 123.2 lb

## 2021-07-25 DIAGNOSIS — F322 Major depressive disorder, single episode, severe without psychotic features: Secondary | ICD-10-CM | POA: Diagnosis not present

## 2021-07-25 DIAGNOSIS — R109 Unspecified abdominal pain: Secondary | ICD-10-CM

## 2021-07-25 DIAGNOSIS — E538 Deficiency of other specified B group vitamins: Secondary | ICD-10-CM

## 2021-07-25 MED ORDER — FLUOXETINE HCL 20 MG PO CAPS: 20.0000 mg | ORAL_CAPSULE | Freq: Every day | ORAL | 3 refills | Status: DC

## 2021-07-25 NOTE — Patient Instructions (Signed)
Call radiology at [phone] to set up UGI with small bowel follow through study  Start the Prozac 20 mg daily and set up 3 week follow up.

## 2021-07-25 NOTE — Progress Notes (Signed)
Established Patient Office Visit  Subjective:  Patient ID: Terrence Riley, male    DOB: 03-29-2001  Age: 20 y.o. MRN: [id number]  CC:  Chief Complaint  Patient presents with   Hospitalization Follow-up    HPI Terrence Riley presents for hospital follow-up from recent ER visit.  He has had a couple recent visits.  He was seen December 23 with some lower abdominal pain.  Had already had recent CT abdomen pelvis.  Recent low B12 and we had ordered upper GI small bowel follow-through to rule out any evidence for Crohn's/inflammatory bowel disease.  Patient states he was never called regarding imaging but we called to check on this and apparently they have been trying to contact him.  Patient was given number to [facility] imaging to set this up.  He then returned to the ER yesterday initially with some complaints of suicidal thoughts.  On further questioning though no active suicidal ideation.  He complained of increased stress issues with his recent medical history, recently losing a job, difficulty finding current work.  He apparently had some fleeting thoughts of harming self but states he has no specific plans and no active suicidal ideation.  No alcohol or illicit drug use.  ER notes imply that he was taking narcotic medication for shoulder but he states he is only taking ibuprofen and recent drug screen was negative for opioids.  Urine drug screen again negative.  He denies any agitation.  No clinical suspicion for bipolar.  He states he is felt depressed for some time but never treated previously medically.  He does folic he had some building depression symptoms for several weeks if not months  Past Medical History:  Diagnosis Date   Allergy    rhinitis   Asthma    as a child   Complication of anesthesia    pt. reports he need more anesthesia with the septoplasty surgery   Eosinophilic esophagitis    heartburn and reflux   Family history of adverse reaction to anesthesia    mother  respiratory failure   Headache    Nasal fracture    deviated septum   Pneumonia    1x history of   Premature baby    2 months early   Seizures ([place])    well controlled on meds/ one 2 months ago when meds were late    Past Surgical History:  Procedure Laterality Date   ADENOIDECTOMY     CLOSED REDUCTION NASAL FRACTURE N/A 08/31/2017   Procedure: CLOSED REDUCTION NASAL FRACTURE;  Surgeon: Clyde Canterbury, MD;  Location: [place];  Service: ENT;  Laterality: N/A;   ORIF HUMERUS FRACTURE Right 05/05/2021   Procedure: reverse Hill-Sachs allografting, biceps tenodesis;  Surgeon: [name], MD;  Location: [place];  Service: Orthopedics;  Laterality: Right;   SEPTOPLASTY N/A 08/31/2017   Procedure: SEPTOPLASTY;  Surgeon: Clyde Canterbury, MD;  Location: [place];  Service: ENT;  Laterality: N/A;   SHOULDER ARTHROSCOPY WITH LABRAL REPAIR Left 04/30/2020   Procedure: LEFT SHOULDER POSTERIOR LABRAL REPAIR WITH ARTHROSCOPY, BICEPS TENDON RELEASE AND TENODESIS, OPEN ALLOGRAFT FOR REVERSE BANKART LESION;  Surgeon: [name], MD;  Location: [place];  Service: Orthopedics;  Laterality: Left;   SHOULDER ARTHROSCOPY WITH LABRAL REPAIR Right 05/05/2021   Procedure: right shoulder arthroscopy, biceps release, posterior labral repair;;  Surgeon: [name], MD;  Location: [facility];  Service: Orthopedics;  Laterality: Right;   TONSILLECTOMY     and addenoids  Family History  Problem Relation Age of Onset   Cancer Mother        breat   Protein S deficiency Mother     Social History   Socioeconomic History   Marital status: Significant Other    Spouse name: Not on file   Number of children: Not on file   Years of education: Not on file   Highest education level: Not on file  Occupational History   Not on file  Tobacco Use   Smoking status: Never   Smokeless tobacco: Never  Vaping Use   Vaping Use: Never used  Substance and Sexual Activity   Alcohol use:  No   Drug use: No   Sexual activity: Never  Other Topics Concern   Not on file  Social History Narrative   Right handed   Social Determinants of Health   Financial Resource Strain: Not on file  Food Insecurity: Not on file  Transportation Needs: Not on file  Physical Activity: Not on file  Stress: Not on file  Social Connections: Not on file  Intimate Partner Violence: Not on file    Outpatient Medications Prior to Visit  Medication Sig Dispense Refill   cetirizine (ZYRTEC) 5 MG tablet Take 5 mg by mouth at bedtime.     clonazePAM (KLONOPIN) 0.5 MG tablet Take 1 tablet (0.5 mg total) by mouth 2 (two) times daily. 60 tablet 5   cyanocobalamin (,VITAMIN B-12,) 1000 MCG/ML injection Inject 1 mL (1,000 mcg total) into the muscle every 30 (thirty) days. 1 mL 0   EPINEPHrine 0.3 mg/0.3 mL IJ SOAJ injection Inject 0.3 mg into the muscle as needed for anaphylaxis. 1 each 0   KEPPRA 1000 MG tablet Take 2 tablets (2,000 mg total) by mouth 2 (two) times daily. 120 tablet 0   lidocaine (LIDODERM) 5 % Place 1 patch onto the skin daily. Remove & Discard patch within 12 hours or as directed by MD (Patient taking differently: Place 1 patch onto the skin daily as needed (pain). Remove & Discard patch within 12 hours or as directed by MD) 30 patch 0   promethazine (PHENERGAN) 25 MG tablet Take 1 tablet (25 mg total) by mouth every 8 (eight) hours as needed for nausea or vomiting. 10 tablet 0   topiramate (TOPAMAX) 200 MG tablet Take 1 tablet (200 mg total) by mouth 2 (two) times daily. 180 tablet 3   No facility-administered medications prior to visit.    Allergies  Allergen Reactions   Lortab [Hydrocodone-Acetaminophen] Hives   Other Anaphylaxis    Peanuts   Zofran [Ondansetron Hcl] Nausea And Vomiting   Citrus Other (See Comments)    Sores in mouth   Dairy Aid [Tilactase] Nausea And Vomiting   Influenza Virus Vaccine Other (See Comments)    Partial paralysis for a couple of weeks per mom    Soy Allergy Other (See Comments)    Seizures    Hydrocodone-Acetaminophen Rash    ROS Review of Systems  Constitutional:  Positive for fatigue. Negative for chills and fever.  Respiratory:  Negative for cough and shortness of breath.   Cardiovascular:  Negative for chest pain.  Psychiatric/Behavioral:  Positive for dysphoric mood. Negative for agitation, confusion, hallucinations and suicidal ideas.      Objective:    Physical Exam Cardiovascular:     Rate and Rhythm: Normal rate and regular rhythm.  Pulmonary:     Effort: Pulmonary effort is normal.     Breath sounds: Normal breath  sounds.  Neurological:     General: No focal deficit present.     Mental Status: He is alert.  Psychiatric:        Behavior: Behavior normal.        Thought Content: Thought content normal.        Judgment: Judgment normal.     Comments: PHQ-9 equals 24    BP 100/60 (BP Location: Left Arm, Patient Position: Sitting, Cuff Size: Normal)    Pulse 75    Temp 97.6 F (36.4 C) (Oral)    Wt 123 lb 3.2 oz (55.9 kg)    SpO2 100%    BMI 16.71 kg/m  Wt Readings from Last 3 Encounters:  07/25/21 123 lb 3.2 oz (55.9 kg)  07/24/21 121 lb (54.9 kg)  07/18/21 130 lb (59 kg)     Health Maintenance Due  Topic Date Due   Pneumococcal Vaccine 22-64 Years old (1 - PCV) 07/02/2006   COVID-19 Vaccine (2 - Booster for Janssen series) 02/25/2020   INFLUENZA VACCINE  02/24/2021    There are no preventive care reminders to display for this patient.  Lab Results  Component Value Date   TSH 1.729 06/22/2021   Lab Results  Component Value Date   WBC 6.1 07/24/2021   HGB 13.5 07/24/2021   HCT 39.2 07/24/2021   MCV 85.4 07/24/2021   PLT 163 07/24/2021   Lab Results  Component Value Date   NA 140 07/24/2021   K 3.1 (L) 07/24/2021   CO2 22 07/24/2021   GLUCOSE 81 07/24/2021   BUN 11 07/24/2021   CREATININE 0.93 07/24/2021   BILITOT 0.9 07/24/2021   ALKPHOS 83 07/24/2021   AST 59 (H) 07/24/2021    ALT 38 07/24/2021   PROT 7.0 07/24/2021   ALBUMIN 4.5 07/24/2021   CALCIUM 8.8 (L) 07/24/2021   ANIONGAP 7 07/24/2021   Lab Results  Component Value Date   CHOL 125 03/12/2020   Lab Results  Component Value Date   HDL 49 03/12/2020   Lab Results  Component Value Date   LDLCALC 63 03/12/2020   Lab Results  Component Value Date   TRIG 51 03/12/2020   Lab Results  Component Value Date   CHOLHDL 2.6 03/12/2020   No results found for: HGBA1C    Assessment & Plan:   #1 depression symptoms.  Patient has some fleeting suicidal thoughts but no active suicidal ideation.  He adamantly denies any plan at this time.  Very high PHQ-9 score as above.  -We recommend going ahead and initiating medication with fluoxetine 20 mg daily.  He is aware this may take several weeks to see full effect. -Patient instructed to set up 3-week follow-up -We also discussed possible referral to behavioral health and he declines at this time. -He does agree to let us know immediately if he has any worsening depression or suicidal thoughts  #2 intermittent abdominal pain and recent B12 deficiency.  Upper GI with small bowel follow-through pending.  Patient was given number for radiology.  He is instructed to call them to set this up.  They apparently tried to contact him multiple times.  Meds ordered this encounter  Medications   FLUoxetine (PROZAC) 20 MG capsule    Sig: Take 1 capsule (20 mg total) by mouth daily.    Dispense:  30 capsule    Refill:  3    Follow-up: Return in about 3 weeks (around 08/15/2021).    Carolann Littler, MD

## 2021-07-30 NOTE — Therapy (Incomplete)
OUTPATIENT PHYSICAL THERAPY SHOULDER EVALUATION   Patient Name: Terrence Riley MRN: [id number] DOB:2000/10/03, 21 y.o., male Today's Date: 07/30/2021    Past Medical History:  Diagnosis Date   Allergy    rhinitis   Asthma    as a child   Complication of anesthesia    pt. reports he need more anesthesia with the septoplasty surgery   Eosinophilic esophagitis    heartburn and reflux   Family history of adverse reaction to anesthesia    mother respiratory failure   Headache    Nasal fracture    deviated septum   Pneumonia    1x history of   Premature baby    2 months early   Seizures ([place])    well controlled on meds/ one 2 months ago when meds were late   Past Surgical History:  Procedure Laterality Date   ADENOIDECTOMY     CLOSED REDUCTION NASAL FRACTURE N/A 08/31/2017   Procedure: CLOSED REDUCTION NASAL FRACTURE;  Surgeon: Clyde Canterbury, MD;  Location: [facility];  Service: ENT;  Laterality: N/A;   ORIF HUMERUS FRACTURE Right 05/05/2021   Procedure: reverse Hill-Sachs allografting, biceps tenodesis;  Surgeon: [name], MD;  Location: [facility];  Service: Orthopedics;  Laterality: Right;   SEPTOPLASTY N/A 08/31/2017   Procedure: SEPTOPLASTY;  Surgeon: Clyde Canterbury, MD;  Location: [facility];  Service: ENT;  Laterality: N/A;   SHOULDER ARTHROSCOPY WITH LABRAL REPAIR Left 04/30/2020   Procedure: LEFT SHOULDER POSTERIOR LABRAL REPAIR WITH ARTHROSCOPY, BICEPS TENDON RELEASE AND TENODESIS, OPEN ALLOGRAFT FOR REVERSE BANKART LESION;  Surgeon: [name], MD;  Location: [facility];  Service: Orthopedics;  Laterality: Left;   SHOULDER ARTHROSCOPY WITH LABRAL REPAIR Right 05/05/2021   Procedure: right shoulder arthroscopy, biceps release, posterior labral repair;;  Surgeon: [name], MD;  Location: [facility];  Service: Orthopedics;  Laterality: Right;   TONSILLECTOMY     and addenoids   Patient Active Problem List   Diagnosis Date Noted    Hypokalemia 06/26/2021   B12 deficiency 06/26/2021   Rhabdomyolysis 06/26/2021   Acute encephalopathy 06/20/2021   Aspiration pneumonia ([place]) 06/20/2021   Febrile illness    Shoulder instability, right    Humeral head fracture, right, with malunion, subsequent encounter    Esophagitis determined by endoscopy 07/24/2020   Labral tear of shoulder, left, subsequent encounter 02/13/2020   Shoulder dislocation, left, subsequent encounter 02/13/2020   Seizure disorder ([place]) 12/22/2019   Migraines 12/22/2019   Mild intermittent asthma 12/22/2019   Migraine without aura and without status migrainosus, not intractable 06/29/2019   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   Altered mental status 03/12/2014   Lethargic 03/09/2014   Environmental allergies 03/01/2014   Gastroesophageal reflux disease with esophagitis 10/06/2013   Localization-related (focal) (partial) symptomatic epilepsy and epileptic syndromes with complex partial seizures, not intractable, without status epilepticus ([place]) 10/06/2013   Spells 07/26/2013   Allergy to other foods 07/13/2013   Prophylactic immunotherapy 07/13/2013   Unspecified asthma, uncomplicated 45/80/9983   Eosinophilic esophagitis 38/25/0539   Gastroesophageal reflux 01/23/2013   Vomiting 01/23/2013   Leukopenia 10/19/2012   Muscle jerks during sleep 10/19/2012   Constipation 08/20/2012   Neutropenia ([place]) 08/20/2012   Allergic rhinitis due to pollen 02/19/2011    PCP: Eulas Post, MD  REFERRING PROVIDER: [name], MD  REFERRING DIAG: [id number] (ICD-10-CM) - S/P arthroscopy of right shoulder  THERAPY DIAG:  No diagnosis found.   ONSET DATE: ***  SUBJECTIVE:  SUBJECTIVE STATEMENT: ***  PERTINENT HISTORY: Rt shoulder arthroscopy, biceps release, posterior  labral repair; reverse Hill-Sachs allografting, biceps tenodesis on 05/05/2021.  PAIN:  Are you having pain? {yes/no:20286} VAS scale: ***/10 Pain location: *** Pain orientation: {Pain Orientation:25161}  PAIN TYPE: {type:313116} Pain description: {PAIN DESCRIPTION:21022940}  Aggravating factors: *** Relieving factors: ***  PRECAUTIONS: Shoulder: No cross arm adduction  No posterior capsular stretching. Follow [facility] protocol for biceps tenodesis.  WEIGHT BEARING RESTRICTIONS {Yes ***/No:24003}  FALLS:  Has patient fallen in last 6 months? {yes/no:20286} Number of falls: ***  LIVING ENVIRONMENT: Lives with: {[facility] lives with:25569::"lives with their family"} Lives in: {Lives in:25570} Stairs: {yes/no:20286}; {Stairs:24000} Has following equipment at home: {Assistive devices:23999}  OCCUPATION: ***  PLOF: {PLOF:24004}  PATIENT GOALS ***  OBJECTIVE:   DIAGNOSTIC FINDINGS:  IMPRESSION: 1. Posterior labral tear and fracture of the posterior glenoid. Abnormal concavity involving the anteromedial aspect of the humeral head and bone marrow edema consistent with a reverse Hill Sachs lesion. Overall findings are consistent with sequela posterior shoulder dislocation.  PATIENT SURVEYS:  FOTO ***  COGNITION:  Overall cognitive status: {cognition:24006}     SENSATION:  Light touch: {intact/deficits:24005}    POSTURE: ***  PALPATION: ***  UPPER EXTREMITY AROM/PROM:  A/PROM Right 07/30/2021 Left 07/30/2021  Shoulder flexion    Shoulder extension    Shoulder abduction    Shoulder adduction    Shoulder internal rotation    Shoulder external rotation    Elbow flexion    Elbow extension    Wrist flexion    Wrist extension    Wrist ulnar deviation    Wrist radial deviation    Wrist pronation    Wrist supination    (Blank rows = not tested)  UPPER EXTREMITY MMT:  MMT Right 07/30/2021 Left 07/30/2021  Shoulder flexion    Shoulder extension     Shoulder abduction    Shoulder adduction    Middle trapezius    Lower trapezius    Elbow flexion    Elbow extension    Wrist flexion    Wrist extension    Wrist ulnar deviation    Wrist radial deviation    Wrist pronation    Wrist supination    Grip strength (lbs)    (Blank rows = not tested)  SHOULDER SPECIAL TESTS:  Impingement tests: {shoulder impingement test:25231:a}  SLAP lesions: {SLAP lesions:25232}  Instability tests: {shoulder instability test:25233}  Rotator cuff assessment: {rotator cuff assessment:25234}  Biceps assessment: {biceps assessment:25235}  JOINT MOBILITY TESTING:  ***  PALPATION:  ***  POSTURE:  ***   TODAY'S TREATMENT:  ***   PATIENT EDUCATION: Education details: *** Person educated: {Person educated:25204} Education method: {Education Method:25205} Education comprehension: {Education Comprehension:25206}   HOME EXERCISE PROGRAM: ***  ASSESSMENT:  CLINICAL IMPRESSION: Patient is a *** y.o. *** who was seen today for physical therapy evaluation and treatment for ***. Objective impairments include {opptimpairments:25111}. These impairments are limiting patient from {activity limitations:25113}. Personal factors including {Personal factors:25162} are also affecting patient's functional outcome. Patient will benefit from skilled PT to address above impairments and improve overall function.  REHAB POTENTIAL: {rehabpotential:25112}  CLINICAL DECISION MAKING: {clinical decision making:25114}  EVALUATION COMPLEXITY: {Evaluation complexity:25115}   GOALS: Goals reviewed with patient? {yes/no:20286}  SHORT TERM GOALS:  STG Name Target Date Goal status  1 *** Baseline:  {follow up:25551} {GOALSTATUS:25110}  2 *** Baseline:  {follow up:25551} {GOALSTATUS:25110}  3 *** Baseline: {follow up:25551} {GOALSTATUS:25110}  4 *** Baseline: {follow up:25551} {GOALSTATUS:25110}   LONG  TERM GOALS:   LTG Name Target Date Goal status  1  *** Baseline: {follow up:25551} {GOALSTATUS:25110}  2 *** Baseline: {follow up:25551} {GOALSTATUS:25110}  3 *** Baseline: {follow up:25551} {GOALSTATUS:25110}  4 *** Baseline: {follow up:25551} {GOALSTATUS:25110}   PLAN: PT FREQUENCY: {rehab frequency:25116}  PT DURATION: {rehab duration:25117}  PLANNED INTERVENTIONS: {rehab planned interventions:25118::"Therapeutic exercises","Therapeutic activity","Neuro Muscular re-education","Balance training","Gait training","Patient/Family education","Joint mobilization"}  PLAN FOR NEXT SESSION: Vanessa Lilbourn, PT, DPT 07/30/21 9:36 AM

## 2021-07-31 ENCOUNTER — Ambulatory Visit: Payer: Medicaid Other

## 2021-08-01 ENCOUNTER — Emergency Department (HOSPITAL_COMMUNITY): Payer: Medicaid Other

## 2021-08-01 ENCOUNTER — Other Ambulatory Visit: Payer: Self-pay

## 2021-08-01 ENCOUNTER — Encounter (HOSPITAL_COMMUNITY): Payer: Self-pay

## 2021-08-01 ENCOUNTER — Ambulatory Visit: Payer: Medicaid Other | Attending: Orthopedic Surgery

## 2021-08-01 ENCOUNTER — Emergency Department (HOSPITAL_COMMUNITY)
Admission: EM | Admit: 2021-08-01 | Discharge: 2021-08-01 | Disposition: A | Discharge status: Home / Self Care | Payer: Medicaid Other | Attending: Emergency Medicine | Admitting: Emergency Medicine

## 2021-08-01 DIAGNOSIS — Z9889 Other specified postprocedural states: Secondary | ICD-10-CM | POA: Insufficient documentation

## 2021-08-01 DIAGNOSIS — M25611 Stiffness of right shoulder, not elsewhere classified: Secondary | ICD-10-CM | POA: Insufficient documentation

## 2021-08-01 DIAGNOSIS — R0781 Pleurodynia: Secondary | ICD-10-CM | POA: Diagnosis not present

## 2021-08-01 DIAGNOSIS — R293 Abnormal posture: Secondary | ICD-10-CM | POA: Insufficient documentation

## 2021-08-01 DIAGNOSIS — M6281 Muscle weakness (generalized): Secondary | ICD-10-CM | POA: Insufficient documentation

## 2021-08-01 DIAGNOSIS — M25612 Stiffness of left shoulder, not elsewhere classified: Secondary | ICD-10-CM | POA: Insufficient documentation

## 2021-08-01 DIAGNOSIS — R0602 Shortness of breath: Secondary | ICD-10-CM | POA: Insufficient documentation

## 2021-08-01 DIAGNOSIS — R079 Chest pain, unspecified: Secondary | ICD-10-CM | POA: Diagnosis present

## 2021-08-01 IMAGING — CR DG CHEST 2V
2 series · 2 of 2 positions shown · non-contrast
Comparison: CT examination dated [DATE] and radiograph
dated [DATE]

CLINICAL DATA: Chest pain, shortness of breath

EXAM:
CHEST - 2 VIEW

[w chest pa]
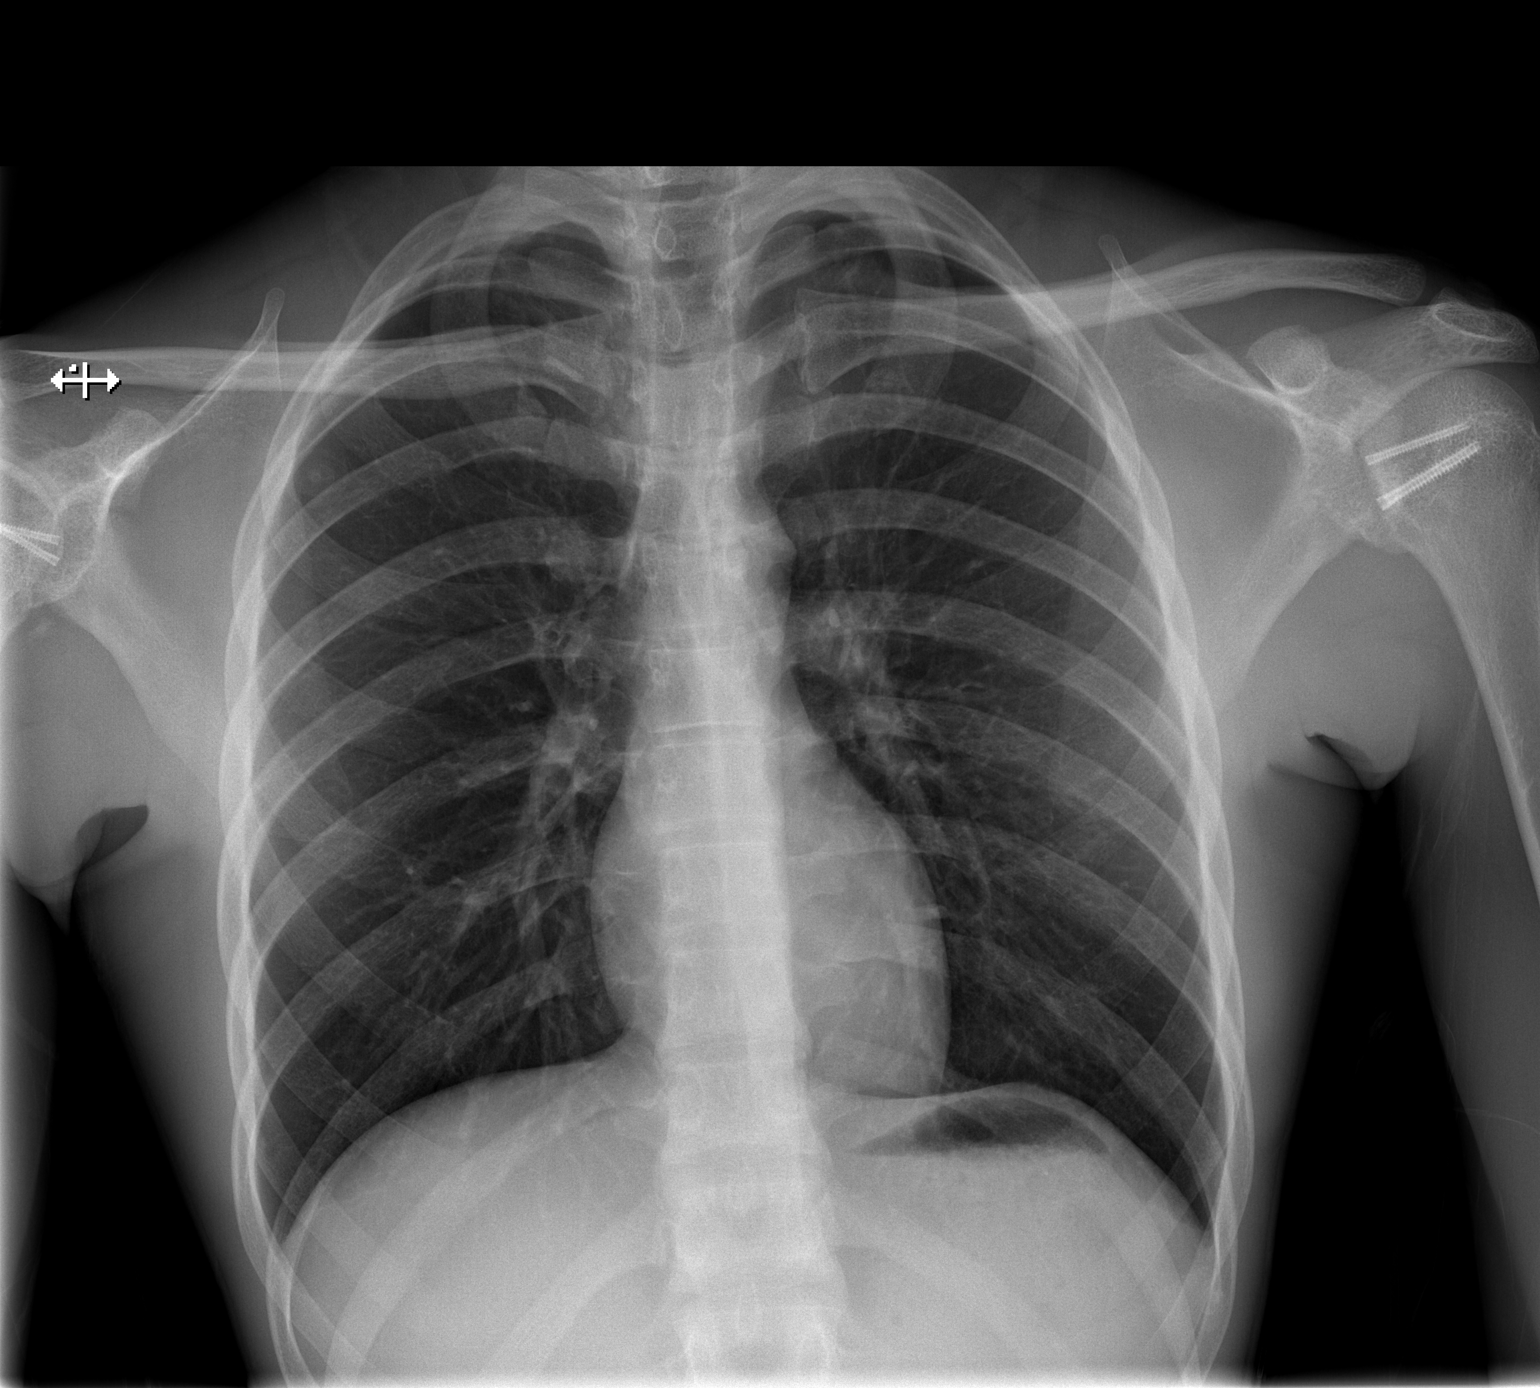

[w chest lat]
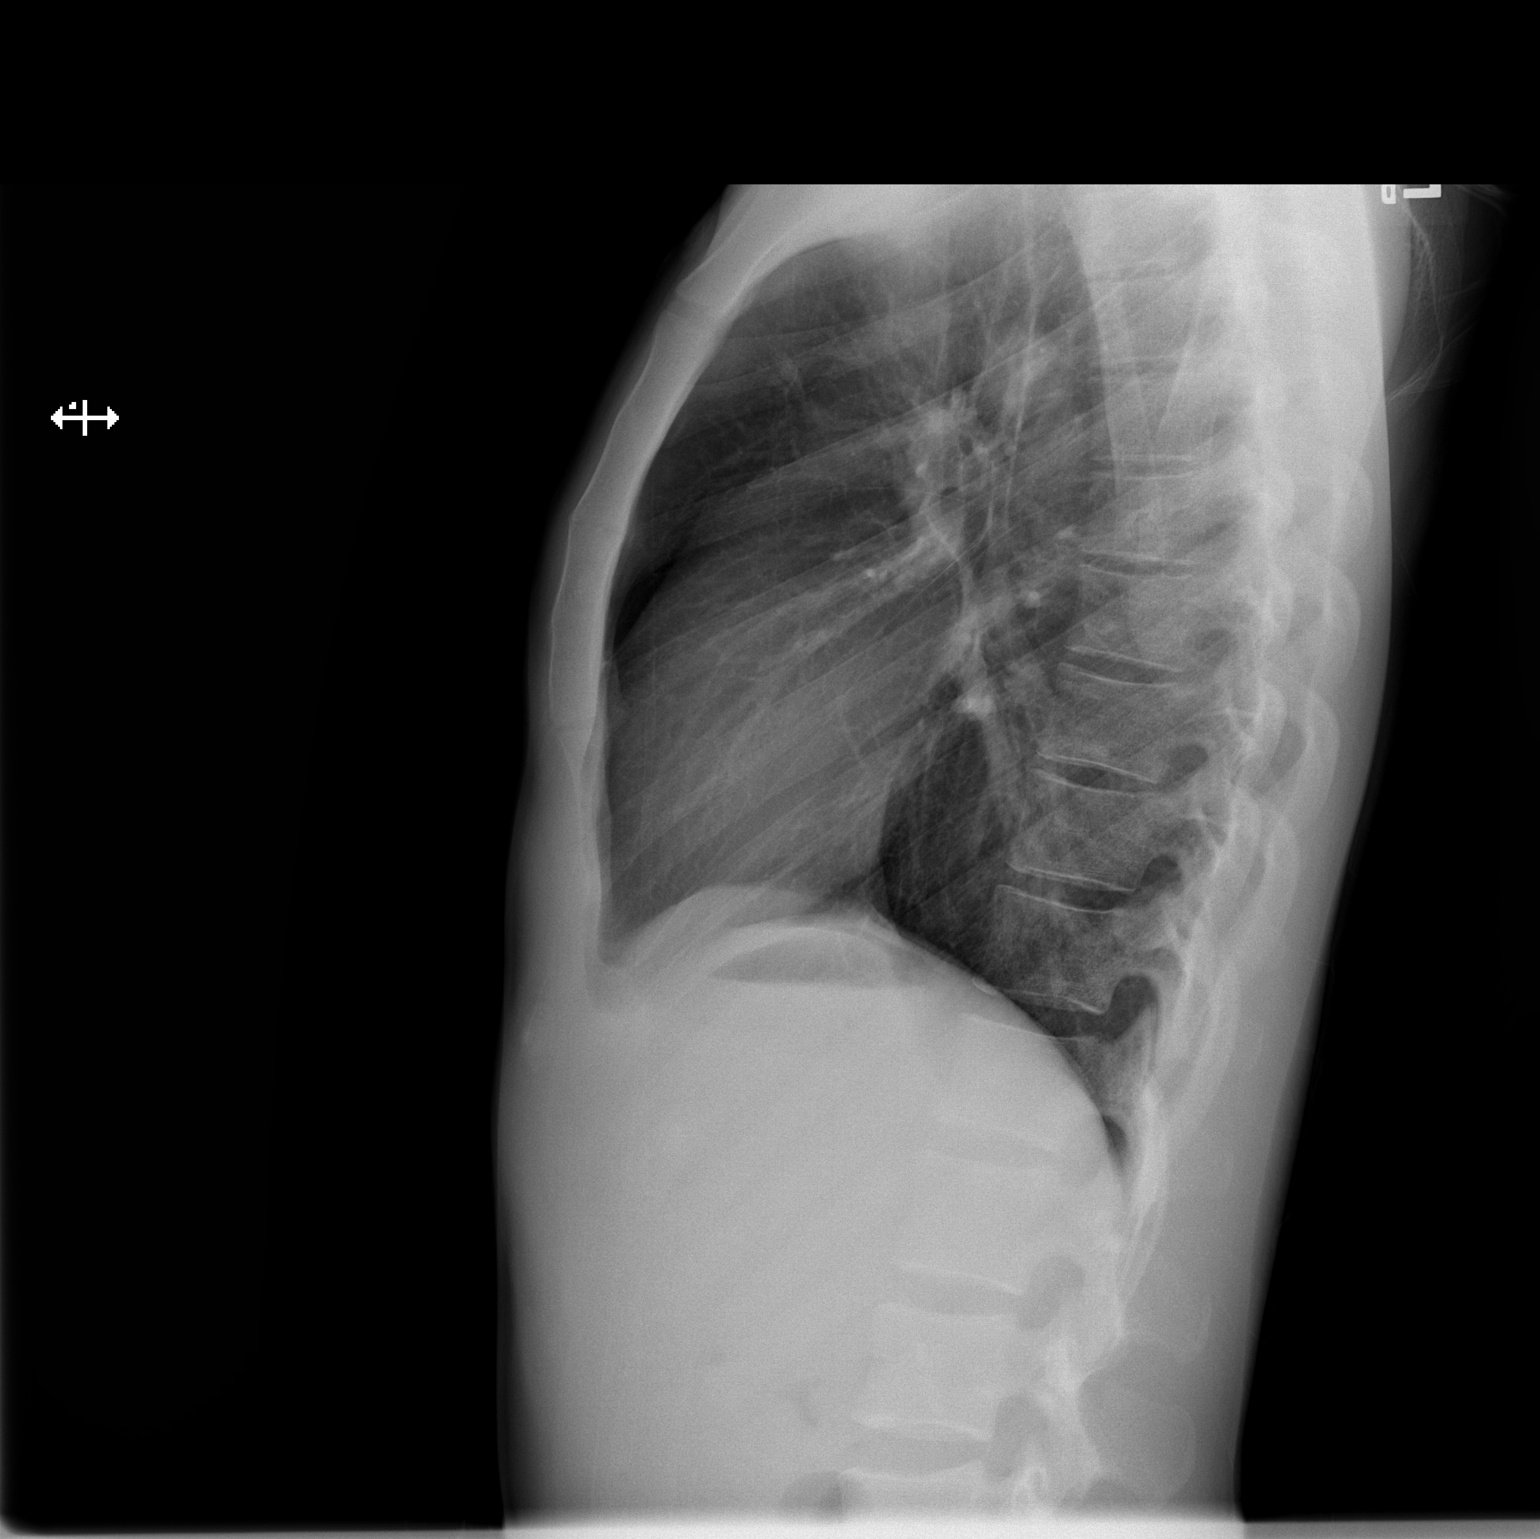

[2 of 2 positions shown; findings below may reference images not displayed]

FINDINGS: The heart size and mediastinal contours are within normal limits.
Both lungs are clear. Postsurgical changes in humeral head with
bilateral cancellous screws.
IMPRESSION: No active cardiopulmonary disease.

## 2021-08-01 MED ORDER — NAPROXEN 500 MG PO TABS: 500.0000 mg | ORAL_TABLET | Freq: Once | ORAL | Status: DC

## 2021-08-01 NOTE — Discharge Instructions (Signed)
The Xrays are normal. The EKG is not showing any signs of heart damage.  Please take ibuprofen every 6-8 hours.  Please return to the ER if you have worsening chest pain, shortness of breath, pain radiating to your jaw, shoulder, or back, sweats or fainting.

## 2021-08-01 NOTE — ED Provider Notes (Signed)
[facility] DEPT Provider Note   CSN: [id number] Arrival date & time: 08/01/21  0636     History  Chief Complaint  Patient presents with   Shortness of Breath    Terrence Riley is a 21 y.o. male.  HPI    Pt has chest pain. Started 2-3 hours ago. No falls. Pain not worse with laying flat. No n/v/f/c. No cough, uri. Pt has no hx of PE, DVT and denies any exogenous hormone (testosterone / estrogen) use, long distance travels or surgery in the past 6 weeks, active cancer, recent immobilization.   Home Medications Prior to Admission medications   Medication Sig Start Date End Date Taking? Authorizing Provider  cetirizine (ZYRTEC) 5 MG tablet Take 5 mg by mouth at bedtime.    [provider]  clonazePAM (KLONOPIN) 0.5 MG tablet Take 1 tablet (0.5 mg total) by mouth 2 (two) times daily. 07/16/21   Cameron Sprang, MD  cyanocobalamin (,VITAMIN B-12,) 1000 MCG/ML injection Inject 1 mL (1,000 mcg total) into the muscle every 30 (thirty) days. 06/27/21   [name], MD  EPINEPHrine 0.3 mg/0.3 mL IJ SOAJ injection Inject 0.3 mg into the muscle as needed for anaphylaxis. 07/10/20   McDonald, Mia A, PA-C  FLUoxetine (PROZAC) 20 MG capsule Take 1 capsule (20 mg total) by mouth daily. 07/25/21   Burchette, Alinda Sierras, MD  KEPPRA 1000 MG tablet Take 2 tablets (2,000 mg total) by mouth 2 (two) times daily. 07/16/21   Cameron Sprang, MD  lidocaine (LIDODERM) 5 % Place 1 patch onto the skin daily. Remove & Discard patch within 12 hours or as directed by MD Patient taking differently: Place 1 patch onto the skin daily as needed (pain). Remove & Discard patch within 12 hours or as directed by MD 02/25/20   Henderly, Britni A, PA-C  promethazine (PHENERGAN) 25 MG tablet Take 1 tablet (25 mg total) by mouth every 8 (eight) hours as needed for nausea or vomiting. 07/16/21   Burchette, Alinda Sierras, MD  topiramate (TOPAMAX) 200 MG tablet Take 1 tablet (200 mg total) by  mouth 2 (two) times daily. 07/16/21   Cameron Sprang, MD      Allergies    Lortab [hydrocodone-acetaminophen], Other, Zofran [name] hcl], Citrus, Dairy aid [tilactase], Influenza virus vaccine, Soy allergy, and Hydrocodone-acetaminophen    Review of Systems   Review of Systems  Constitutional:  Positive for activity change.  Cardiovascular:  Positive for chest pain.   Physical Exam Updated Vital Signs BP 119/75 (BP Location: Left Arm)    Pulse 86    Temp 97.7 F (36.5 C) (Oral)    Resp 16    Ht 6' (1.829 m)    Wt 54.9 kg    SpO2 100%    BMI 16.41 kg/m  Physical Exam Vitals and nursing note reviewed.  HENT:     Head: Atraumatic.  Cardiovascular:     Rate and Rhythm: Normal rate.  Pulmonary:     Effort: Pulmonary effort is normal.  Chest:     Chest wall: Tenderness present.  Musculoskeletal:     Cervical back: Neck supple.  Skin:    General: Skin is warm.  Neurological:     Mental Status: He is alert and oriented to person, place, and time.    ED Results / Procedures / Treatments   Labs (all labs ordered are listed, but only abnormal results are displayed) Labs Reviewed - No data to display  EKG  EKG Interpretation  Date/Time:  Friday August 01 2021 06:44:24 EST Ventricular Rate:  59 PR Interval:  127 QRS Duration: 81 QT Interval:  394 QTC Calculation: 391 R Axis:   64 Text Interpretation: Sinus rhythm RSR' in V1 or V2, right VCD or RVH ST elev, probable normal early repol pattern No significant change was found Confirmed by Shanon Rosser [phone]) on 08/01/2021 6:50:15 AM  Radiology DG Chest 2 View  Result Date: 08/01/2021 CLINICAL DATA:  Chest pain, shortness of breath EXAM: CHEST - 2 VIEW COMPARISON:  CT examination dated June 20, 2001 and radiograph dated July 06, 2021 FINDINGS: The heart size and mediastinal contours are within normal limits. Both lungs are clear. Postsurgical changes in humeral head with bilateral cancellous screws. IMPRESSION: No  active cardiopulmonary disease. Electronically Signed   By: Keane Police D.O.   On: 08/01/2021 07:17    Procedures Procedures    Medications Ordered in ED Medications  naproxen (NAPROSYN) tablet 500 mg (has no administration in time range)    ED Course/ Medical Decision Making/ A&P                           Medical Decision Making Amount and/or Complexity of Data Reviewed Labs:     Details: Considered troponins, but patient has a very atypical chest pain, constant x 2 hours with normal ekg and no risk factors at all for ACS. Radiology: ordered and independent interpretation performed.    Details: Reassuring - no PTX or PNA ECG/medicine tests: ordered and independent interpretation performed.  Risk OTC drugs.   Pt is PERC neg - no dimer / PE needed. WELLs criteria - low probability.  The patient appears reasonably screened and/or stabilized for discharge and I doubt any other medical condition or other [facility] requiring further screening, evaluation, or treatment in the ED at this time prior to discharge.   Results from the ER workup discussed with the patient face to face and all questions answered to the best of my ability. The patient is safe for discharge with strict return precautions. Discharge with nsaids.         Final Clinical Impression(s) / ED Diagnoses Final diagnoses:  Pleuritic chest pain    Rx / DC Orders ED Discharge Orders     None         Varney Biles, MD 08/01/21 [phone]

## 2021-08-01 NOTE — ED Triage Notes (Signed)
Pt arrives with c/o shob, nausea, headache, fatigue and sharp chest pain on inspiration.

## 2021-08-01 NOTE — Therapy (Signed)
OUTPATIENT PHYSICAL THERAPY SHOULDER EVALUATION   Patient Name: Terrence Riley MRN: [id number] DOB:2000/11/05, 21 y.o., male Today's Date: 08/02/2021   PT End of Session - 08/02/21 2221     Visit Number 1    Number of Visits 17    Date for PT Re-Evaluation 10/03/21    Authorization Type [place] MEDICA[employer] OF [place]    Progress Note Due on Visit 10    PT Start Time 1230    PT Stop Time 1315    PT Time Calculation (min) 45 min    Activity Tolerance Patient tolerated treatment well    Behavior During Therapy WFL for tasks assessed/performed              Past Medical History:  Diagnosis Date   Allergy    rhinitis   Asthma    as a child   Complication of anesthesia    pt. reports he need more anesthesia with the septoplasty surgery   Eosinophilic esophagitis    heartburn and reflux   Family history of adverse reaction to anesthesia    mother respiratory failure   Headache    Nasal fracture    deviated septum   Pneumonia    1x history of   Premature baby    2 months early   Seizures ([place])    well controlled on meds/ one 2 months ago when meds were late   Past Surgical History:  Procedure Laterality Date   ADENOIDECTOMY     CLOSED REDUCTION NASAL FRACTURE N/A 08/31/2017   Procedure: CLOSED REDUCTION NASAL FRACTURE;  Surgeon: Clyde Canterbury, MD;  Location: [place];  Service: ENT;  Laterality: N/A;   ORIF HUMERUS FRACTURE Right 05/05/2021   Procedure: reverse Hill-Sachs allografting, biceps tenodesis;  Surgeon: [name], MD;  Location: [place];  Service: Orthopedics;  Laterality: Right;   SEPTOPLASTY N/A 08/31/2017   Procedure: SEPTOPLASTY;  Surgeon: Clyde Canterbury, MD;  Location: [place];  Service: ENT;  Laterality: N/A;   SHOULDER ARTHROSCOPY WITH LABRAL REPAIR Left 04/30/2020   Procedure: LEFT SHOULDER POSTERIOR LABRAL REPAIR WITH ARTHROSCOPY, BICEPS TENDON RELEASE AND TENODESIS, OPEN ALLOGRAFT FOR REVERSE BANKART LESION;   Surgeon: [name], MD;  Location: [place];  Service: Orthopedics;  Laterality: Left;   SHOULDER ARTHROSCOPY WITH LABRAL REPAIR Right 05/05/2021   Procedure: right shoulder arthroscopy, biceps release, posterior labral repair;;  Surgeon: [name], MD;  Location: [place];  Service: Orthopedics;  Laterality: Right;   TONSILLECTOMY     and addenoids   Patient Active Problem List   Diagnosis Date Noted   Hypokalemia 06/26/2021   B12 deficiency 06/26/2021   Rhabdomyolysis 06/26/2021   Acute encephalopathy 06/20/2021   Aspiration pneumonia ([place]) 06/20/2021   Febrile illness    Shoulder instability, right    Humeral head fracture, right, with malunion, subsequent encounter    Esophagitis determined by endoscopy 07/24/2020   Labral tear of shoulder, left, subsequent encounter 02/13/2020   Shoulder dislocation, left, subsequent encounter 02/13/2020   Seizure disorder ([place]) 12/22/2019   Migraines 12/22/2019   Mild intermittent asthma 12/22/2019   Migraine without aura and without status migrainosus, not intractable 06/29/2019   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   Altered mental status 03/12/2014   Lethargic 03/09/2014   Environmental allergies 03/01/2014   Gastroesophageal reflux disease with esophagitis 10/06/2013   Localization-related (focal) (partial) symptomatic epilepsy and epileptic syndromes with complex partial seizures, not intractable, without status epilepticus ([place]) 10/06/2013   Spells 07/26/2013  Allergy to other foods 07/13/2013   Prophylactic immunotherapy 07/13/2013   Unspecified asthma, uncomplicated 76/28/3151   Eosinophilic esophagitis 76/16/0737   Gastroesophageal reflux 01/23/2013   Vomiting 01/23/2013   Leukopenia 10/19/2012   Muscle jerks during sleep 10/19/2012   Constipation 08/20/2012   Neutropenia ([place]) 08/20/2012   Allergic rhinitis due to pollen 02/19/2011    PCP: Eulas Post, MD  REFERRING PROVIDER: [name],  MD  REFERRING DIAG: S/P arthroscopy of right shoulder- 05/05/21: Procedure: reverse Hill-Sachs allografting, biceps tenodesis   THERAPY DIAG:  Decreased ROM of right shoulder  S/P arthroscopy of right shoulder  Muscle weakness (generalized)   ONSET DATE: 05/05/22  SUBJECTIVE:                                                                                                                                                                                      SUBJECTIVE STATEMENT: Pt reports his R shoulder has not dislocated since surgery. Pt reports he has R shoulder pain primarily when shooting basketball.  PERTINENT HISTORY: LEFT SHOULDER POSTERIOR LABRAL REPAIR WITH ARTHROSCOPY  PAIN:  Are you having pain? Yes VAS scale: 4/10 Pain location: Shoulder Pain orientation: Right  PAIN TYPE: aching Pain description: intermittent  Aggravating factors: Shooting basketball Relieving factors: Rest  PRECAUTIONS: Shoulder: No crossed arm adduction and no posterior capsular stretching.  2 times a week for 4 weeks.  Follow-up with me in 8 weeks.  I think is okay for him to do some shooting and dribbling and recreational play beginning at the earliest in the January which would be 3 months out after procedure.  WEIGHT BEARING RESTRICTIONS No     FALLS:  Has patient fallen in last 6 months? No Number of falls: 0  LIVING ENVIRONMENT: Lives with: lives with their family Lives in: House/apartment Stairs: Yes; Internal: 14 steps; can reach both and External: 0 steps; none Has following equipment at home: None  OCCUPATION: Unemployed, looking to return to school  PLOF: Independent  PATIENT GOALS To play recreational basketball  OBJECTIVE:   DIAGNOSTIC FINDINGS:  IMPRESSION: 03/07/21 1. Posterior labral tear and fracture of the posterior glenoid. Abnormal concavity involving the anteromedial aspect of the humeral head and bone marrow edema consistent with a reverse Hill  Sachs lesion. Overall findings are consistent with sequela posterior shoulder dislocation.  COGNITION:  Overall cognitive status: Within functional limits for tasks assessed     SENSATION:  Light touch: Appears intact  POSTURE: Bilat winging scapula  PALPATION: NT  UPPER EXTREMITY AROM/PROM:  A/PROM Right 08/02/2021 Left 08/02/2021  Shoulder flexion 120/140 140/160  Shoulder extension    Shoulder abduction 115/120 125/160  Shoulder adduction  Shoulder internal rotation L1/40 T8/63  Shoulder external rotation T5/65 T7/90  Elbow flexion    Elbow extension    Wrist flexion    Wrist extension    Wrist ulnar deviation    Wrist radial deviation    Wrist pronation    Wrist supination    (Blank rows = not tested) Tendency for compensation for R shoulder elevation with trunk ext. Pt experiences R shoulder pain at the endrange of shoulder flexion.  UPPER EXTREMITY MMT:  MMT Right 08/02/2021 Left 08/02/2021  Shoulder flexion 4 4+  Shoulder extension 5 5  Shoulder abduction 4 4+  Shoulder adduction 5 5  IR 4 5  ER 4 5  Elbow flexion 4 5  Elbow extension 4 5  Wrist flexion    Wrist extension    Wrist ulnar deviation    Wrist radial deviation    Wrist pronation    Wrist supination    Grip strength (lbs)    (Blank rows = not tested)   TODAY'S TREATMENT:  Shoulder flex stretch on wall x2, 30" Shoulder ER stretch in doorway x2, 30" Shoulder IR stretch c towel x2, 30"  PATIENT EDUCATION: Education details: Eval findings, POC, HEP Person educated: Patient Education method: Explanation, Demonstration, Tactile cues, Verbal cues, and Handouts Education comprehension: verbalized understanding, returned demonstration, verbal cues required, tactile cues required, and needs further education   HOME EXERCISE PROGRAM: Access Code: [facility] URL: [email or web address] Date: 08/02/2021 Prepared by: Gar Ponto  Exercises Standing Single Arm Shoulder Flexion  Stretch on Wall (Mirrored) - 2 x daily - 7 x weekly - 1 sets - 3 reps - 30 hold Standing Shoulder External Rotation Stretch in Doorway - 2 x daily - 7 x weekly - 1 sets - 3 reps - 30 hold Standing Shoulder Internal Rotation Stretch with Towel - 2 x daily - 7 x weekly - 1 sets - 3 reps - 30 hold   ASSESSMENT:  CLINICAL IMPRESSION: Patient is a 21 y.o. M who was seen today for physical therapy evaluation and treatment for  S/P arthroscopy of right shoulder-05/05/21: Procedure: reverse Hill-Sachs allografting, biceps tenodesis . Objective impairments include decreased ROM, decreased strength, impaired UE functional use, and pain. These impairments are limiting patient from  playing recreational basketball . Pt is currently experiencing R shoulder pain at the apex of R UE elevation when shooting a basketball. Personal factors including Time since onset of injury/illness/exacerbation are also affecting patient's functional outcome. Patient will benefit from skilled PT to address above impairments and improve overall function.  REHAB POTENTIAL: Excellent  CLINICAL DECISION MAKING: Stable/uncomplicated  EVALUATION COMPLEXITY: Low   GOALS:  SHORT TERM GOALS=LTGs   LONG TERM GOALS:   LTG Name Target Date Goal status  1 Pt will be Ind in a final HEP to maintain achieved level of function Baseline: 10/03/21 INITIAL  2 Increase R shoulder AROM: flexion 140d, abd 140d, IR T10, ER T7 for improved R shoulder function Baseline: flex 120, abd 115, IR L1, ER T5 10/03/21  INITIAL  3 Increase R shoulder strength to 4+/5 or greater for improved R shoulder function Baseline:flex, abd, ER, IR 4/5 10/03/21 INITIAL  4 Pt will be able to shoot a basketball s experiencing R shoulder pain  Baseline: 10/03/21 INITIAL   PLAN: PT FREQUENCY: 2x/week  PT DURATION: 8 weeks  PLANNED INTERVENTIONS: Therapeutic exercises, Therapeutic activity, Neuro Muscular re-education, Patient/Family education, Joint mobilization,  Dry Needling, Cryotherapy, Moist heat, Taping, Vasopneumatic device, Ultrasound, Ionotophoresis 4mg /ml Dexamethasone, and Manual therapy  PLAN FOR NEXT SESSION: Assess response to HEP, progress therex to strengthening exs and add to HEP, utilize manual therpay and modalities as indicated   [employer] MS, PT 08/02/21 11:04 PM  Check all possible CPT codes: 97110- Therapeutic Exercise, 97112- Neuro Re-education, 97140 - Manual Therapy, 97530 - Therapeutic Activities, 97535 - Self Care, 97014 - Electrical stimulation (unattended), [id number] - Electrical stimulation (Manual), [id number] - Iontophoresis, [id number] - Ultrasound, and 68257 - Vaso

## 2021-08-05 ENCOUNTER — Emergency Department (HOSPITAL_COMMUNITY)
Admission: EM | Admit: 2021-08-05 | Discharge: 2021-08-06 | Disposition: A | Discharge status: Home / Self Care | Payer: Medicaid Other | Attending: Physician Assistant | Admitting: Physician Assistant

## 2021-08-05 ENCOUNTER — Encounter (HOSPITAL_COMMUNITY): Payer: Self-pay

## 2021-08-05 DIAGNOSIS — R079 Chest pain, unspecified: Secondary | ICD-10-CM | POA: Diagnosis not present

## 2021-08-05 DIAGNOSIS — Z5321 Procedure and treatment not carried out due to patient leaving prior to being seen by health care provider: Secondary | ICD-10-CM | POA: Diagnosis not present

## 2021-08-05 NOTE — ED Triage Notes (Signed)
Pt BIB [facility] for eval. Ems reports he was found unresponsive in the vehicle by friends mother. EMS reports pt w/hx of seizures. EMS reports pt was voluntarily squeezing eyes shut when he was trying to assess him. Pt will not communicate w/ EMS

## 2021-08-06 ENCOUNTER — Emergency Department (HOSPITAL_COMMUNITY): Payer: Medicaid Other

## 2021-08-06 ENCOUNTER — Encounter: Payer: Self-pay | Admitting: Neurology

## 2021-08-06 ENCOUNTER — Other Ambulatory Visit: Payer: Self-pay

## 2021-08-06 LAB — COMPREHENSIVE METABOLIC PANEL
ALT: 29 U/L (ref 0–44)
AST: 31 U/L (ref 15–41)
Albumin: 4.4 g/dL (ref 3.5–5.0)
Alkaline Phosphatase: 88 U/L (ref 38–126)
Anion gap: 9 (ref 5–15)
BUN: 9 mg/dL (ref 6–20)
CO2: 19 mmol/L — ABNORMAL LOW (ref 22–32)
Calcium: 8.9 mg/dL (ref 8.9–10.3)
Chloride: 108 mmol/L (ref 98–111)
Creatinine, Ser: 1.17 mg/dL (ref 0.61–1.24)
GFR, Estimated: >60 mL/min (ref >60)
Glucose, Bld: 80 mg/dL (ref 70–99)
Potassium: 3.7 mmol/L (ref 3.5–5.1)
Sodium: 136 mmol/L (ref 135–145)
Total Bilirubin: 0.8 mg/dL (ref 0.3–1.2)
Total Protein: 7.1 g/dL (ref 6.5–8.1)

## 2021-08-06 LAB — CBC WITH DIFFERENTIAL/PLATELET
Abs Immature Granulocytes: 0.01 10*3/uL (ref 0.00–0.07)
Basophils Absolute: 0.1 10*3/uL (ref 0.0–0.1)
Basophils Relative: 2 %
Eosinophils Absolute: 0.3 10*3/uL (ref 0.0–0.5)
Eosinophils Relative: 8 %
HCT: 45.5 % (ref 39.0–52.0)
Hemoglobin: 15.3 g/dL (ref 13.0–17.0)
Immature Granulocytes: 0 %
Lymphocytes Relative: 47 %
Lymphs Abs: 1.8 10*3/uL (ref 0.7–4.0)
MCH: 29.5 pg (ref 26.0–34.0)
MCHC: 33.6 g/dL (ref 30.0–36.0)
MCV: 87.8 fL (ref 80.0–100.0)
Monocytes Absolute: 0.3 10*3/uL (ref 0.1–1.0)
Monocytes Relative: 7 %
Neutro Abs: 1.3 10*3/uL — ABNORMAL LOW (ref 1.7–7.7)
Neutrophils Relative %: 36 %
Platelets: 196 10*3/uL (ref 150–400)
RBC: 5.18 MIL/uL (ref 4.22–5.81)
RDW: 13.4 % (ref 11.5–15.5)
WBC: 3.7 10*3/uL — ABNORMAL LOW (ref 4.0–10.5)
nRBC: 0 % (ref 0.0–0.2)

## 2021-08-06 LAB — RAPID URINE DRUG SCREEN, HOSP PERFORMED
Amphetamines: NOT DETECTED
Barbiturates: NOT DETECTED
Benzodiazepines: NOT DETECTED
Cocaine: NOT DETECTED
Opiates: NOT DETECTED
Tetrahydrocannabinol: NOT DETECTED

## 2021-08-06 LAB — TROPONIN I (HIGH SENSITIVITY): Troponin I (High Sensitivity): 3 ng/L (ref <18)

## 2021-08-06 LAB — ETHANOL: Alcohol, Ethyl (B): <10 mg/dL (ref <10)

## 2021-08-06 MED ORDER — CLONAZEPAM 0.5 MG PO TABS: 0.5000 mg | ORAL_TABLET | Freq: Two times a day (BID) | ORAL | 0 refills | Status: DC

## 2021-08-06 MED ORDER — TOPIRAMATE 200 MG PO TABS: 200.0000 mg | ORAL_TABLET | Freq: Two times a day (BID) | ORAL | 0 refills | Status: DC

## 2021-08-06 MED ORDER — KEPPRA 1000 MG PO TABS: 2000.0000 mg | ORAL_TABLET | Freq: Two times a day (BID) | ORAL | 0 refills | Status: DC

## 2021-08-06 NOTE — ED Notes (Signed)
Pt"s mother would like to take him home so he can take his meds

## 2021-08-06 NOTE — ED Provider Triage Note (Signed)
Emergency Medicine Provider Triage Evaluation Note  Terrence Riley , a 21 y.o. male  was evaluated in triage.  Pt complains of chest pain.  Per EMS, he was found unresponsive in his vehicle by a friend's mother.  He does have a history of seizures.  EMS noted the patient was voluntarily squeezing his eyes shut while they were trying to assess him.  Patient would not communicate with EMS.  My exam patient appears anxious.  He tells me that he has left-sided chest pain.  States it started about 1 hour ago.  Does not provide any additional details.  States he has no other symptoms.  Patient states he does not remember being unresponsive in a vehicle.  Denies any drug use or alcohol use.  Denies any SI/HI or auditory/visual hallucinations.  Physical Exam  BP 119/83 (BP Location: Right Arm)    Pulse 62    Temp 98.5 F (36.9 C) (Oral)    Resp 14    Ht 6' (1.829 m)    Wt 55 kg    SpO2 100%    BMI 16.44 kg/m  Gen:   Awake, anxious appearing Resp:  Normal effort  MSK:   Moves extremities without difficulty  Other:    Medical Decision Making  Medically screening exam initiated at 12:04 AM.  Appropriate orders placed.  Terrence Riley was informed that the remainder of the evaluation will be completed by another provider, this initial triage assessment does not replace that evaluation, and the importance of remaining in the ED until their evaluation is complete.   Rayna Sexton, PA-C 08/06/21 0016

## 2021-08-07 ENCOUNTER — Ambulatory Visit: Payer: No Typology Code available for payment source

## 2021-08-07 DIAGNOSIS — G9340 Encephalopathy, unspecified: Secondary | ICD-10-CM

## 2021-08-07 DIAGNOSIS — G40909 Epilepsy, unspecified, not intractable, without status epilepticus: Secondary | ICD-10-CM

## 2021-08-07 NOTE — Patient Instructions (Signed)
Visit Information  Thank you for allowing me to share the care management and care coordination services that are available to you as part of your health plan and services through your primary care provider and medical home. Please reach out to me at [phone] if the care management/care coordination team may be of assistance to you in the future.  Caliber Landess RN, BSN,CCM, CDE Care Management Coordinator [facility] [phone], Mobile [phone]   

## 2021-08-07 NOTE — Chronic Care Management (AMB) (Signed)
°  Care Management   Follow Up Note   08/07/2021 Name: Terrence Riley MRN: [id number] DOB: 2000-12-11   Referred by: Eulas Post, MD Reason for referral : Care Coordination (RNCM: Initial Outreach Care coordination needs-declines services)   Successful contact was made with the patient to discuss care management and care coordination services. Patient declines engagement at this time.  States that he is doing good and does not need case management services at this time  Follow Up Plan: The patient has been provided with contact information for the care management team and has been advised to call with any health related questions or concerns.   Peter Garter RN, Jackquline Denmark, CDE Care Management Coordinator [facility] [phone], Mobile [phone]

## 2021-08-11 ENCOUNTER — Other Ambulatory Visit: Payer: Self-pay | Admitting: Surgical

## 2021-08-11 ENCOUNTER — Other Ambulatory Visit (HOSPITAL_COMMUNITY): Payer: Self-pay

## 2021-08-12 ENCOUNTER — Telehealth: Payer: Self-pay

## 2021-08-12 ENCOUNTER — Ambulatory Visit: Payer: Medicaid Other

## 2021-08-12 NOTE — Telephone Encounter (Signed)
Spoke to pt today's no show appt. Discussed attendance policy and upcoming appt on 08/14/21. Pt voiced understanding.

## 2021-08-12 NOTE — Therapy (Incomplete)
OUTPATIENT PHYSICAL THERAPY TREATMENT NOTE   Patient Name: Terrence Riley MRN: [id number] DOB:07-17-2001, 21 y.o., male Today's Date: 08/12/2021  PCP: Eulas Post, MD REFERRING PROVIDER: Eulas Post, MD    Past Medical History:  Diagnosis Date   Allergy    rhinitis   Asthma    as a child   Complication of anesthesia    pt. reports he need more anesthesia with the septoplasty surgery   Eosinophilic esophagitis    heartburn and reflux   Family history of adverse reaction to anesthesia    mother respiratory failure   Headache    Nasal fracture    deviated septum   Pneumonia    1x history of   Premature baby    2 months early   Seizures ([place])    well controlled on meds/ one 2 months ago when meds were late   Past Surgical History:  Procedure Laterality Date   ADENOIDECTOMY     CLOSED REDUCTION NASAL FRACTURE N/A 08/31/2017   Procedure: CLOSED REDUCTION NASAL FRACTURE;  Surgeon: Clyde Canterbury, MD;  Location: [place];  Service: ENT;  Laterality: N/A;   ORIF HUMERUS FRACTURE Right 05/05/2021   Procedure: reverse Hill-Sachs allografting, biceps tenodesis;  Surgeon: [name], MD;  Location: [place];  Service: Orthopedics;  Laterality: Right;   SEPTOPLASTY N/A 08/31/2017   Procedure: SEPTOPLASTY;  Surgeon: Clyde Canterbury, MD;  Location: [place];  Service: ENT;  Laterality: N/A;   SHOULDER ARTHROSCOPY WITH LABRAL REPAIR Left 04/30/2020   Procedure: LEFT SHOULDER POSTERIOR LABRAL REPAIR WITH ARTHROSCOPY, BICEPS TENDON RELEASE AND TENODESIS, OPEN ALLOGRAFT FOR REVERSE BANKART LESION;  Surgeon: [name], MD;  Location: [place];  Service: Orthopedics;  Laterality: Left;   SHOULDER ARTHROSCOPY WITH LABRAL REPAIR Right 05/05/2021   Procedure: right shoulder arthroscopy, biceps release, posterior labral repair;;  Surgeon: [name], MD;  Location: [place];  Service: Orthopedics;  Laterality: Right;   TONSILLECTOMY     and  addenoids   Patient Active Problem List   Diagnosis Date Noted   Hypokalemia 06/26/2021   B12 deficiency 06/26/2021   Rhabdomyolysis 06/26/2021   Acute encephalopathy 06/20/2021   Aspiration pneumonia ([place]) 06/20/2021   Febrile illness    Shoulder instability, right    Humeral head fracture, right, with malunion, subsequent encounter    Esophagitis determined by endoscopy 07/24/2020   Labral tear of shoulder, left, subsequent encounter 02/13/2020   Shoulder dislocation, left, subsequent encounter 02/13/2020   Seizure disorder ([place]) 12/22/2019   Migraines 12/22/2019   Mild intermittent asthma 12/22/2019   Migraine without aura and without status migrainosus, not intractable 06/29/2019   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   Altered mental status 03/12/2014   Lethargic 03/09/2014   Environmental allergies 03/01/2014   Gastroesophageal reflux disease with esophagitis 10/06/2013   Localization-related (focal) (partial) symptomatic epilepsy and epileptic syndromes with complex partial seizures, not intractable, without status epilepticus ([place]) 10/06/2013   Spells 07/26/2013   Allergy to other foods 07/13/2013   Prophylactic immunotherapy 07/13/2013   Unspecified asthma, uncomplicated 97/98/9211   Eosinophilic esophagitis 94/17/4081   Gastroesophageal reflux 01/23/2013   Vomiting 01/23/2013   Leukopenia 10/19/2012   Muscle jerks during sleep 10/19/2012   Constipation 08/20/2012   Neutropenia ([place]) 08/20/2012   Allergic rhinitis due to pollen 02/19/2011    REFERRING DIAG: S/P arthroscopy of right shoulder- 05/05/21: Procedure: reverse Hill-Sachs allografting, biceps tenodesis   THERAPY DIAG:  No diagnosis found.  PERTINENT HISTORY: ***  PRECAUTIONS: ***  SUBJECTIVE: ***  PAIN:  Are you having pain? {yes/no:20286} NPRS scale: ***/10 Pain location: *** Pain orientation: {Pain Orientation:25161}  PAIN TYPE: {type:313116} Pain description: {PAIN DESCRIPTION:21022940}   Aggravating factors: *** Relieving factors: ***     (Copy Today's treatment to Plan section here) OBJECTIVE:    DIAGNOSTIC FINDINGS:  IMPRESSION: 03/07/21 1. Posterior labral tear and fracture of the posterior glenoid. Abnormal concavity involving the anteromedial aspect of the humeral head and bone marrow edema consistent with a reverse Hill Sachs lesion. Overall findings are consistent with sequela posterior shoulder dislocation.   COGNITION:          Overall cognitive status: Within functional limits for tasks assessed                               SENSATION:          Light touch: Appears intact   POSTURE: Bilat winging scapula   PALPATION: NT   UPPER EXTREMITY AROM/PROM:   A/PROM Right 08/02/2021 Left 08/02/2021  Shoulder flexion 120/140 140/160  Shoulder extension      Shoulder abduction 115/120 125/160  Shoulder adduction      Shoulder internal rotation L1/40 T8/63  Shoulder external rotation T5/65 T7/90  Elbow flexion      Elbow extension      Wrist flexion      Wrist extension      Wrist ulnar deviation      Wrist radial deviation      Wrist pronation      Wrist supination      (Blank rows = not tested) Tendency for compensation for R shoulder elevation with trunk ext. Pt experiences R shoulder pain at the endrange of shoulder flexion.   UPPER EXTREMITY MMT:   MMT Right 08/02/2021 Left 08/02/2021  Shoulder flexion 4 4+  Shoulder extension 5 5  Shoulder abduction 4 4+  Shoulder adduction 5 5  IR 4 5  ER 4 5  Elbow flexion 4 5  Elbow extension 4 5  Wrist flexion      Wrist extension      Wrist ulnar deviation      Wrist radial deviation      Wrist pronation      Wrist supination      Grip strength (lbs)      (Blank rows = not tested)            TODAY'S TREATMENT:  Shoulder flex stretch on wall x2, 30" Shoulder ER stretch in doorway x2, 30" Shoulder IR stretch c towel x2, 30"   PATIENT EDUCATION: Education details: Eval findings, POC,  HEP Person educated: Patient Education method: Explanation, Demonstration, Tactile cues, Verbal cues, and Handouts Education comprehension: verbalized understanding, returned demonstration, verbal cues required, tactile cues required, and needs further education     HOME EXERCISE PROGRAM: Access Code: [facility] URL: [email or web address] Date: 08/02/2021 Prepared by: Gar Ponto   Exercises Standing Single Arm Shoulder Flexion Stretch on Wall (Mirrored) - 2 x daily - 7 x weekly - 1 sets - 3 reps - 30 hold Standing Shoulder External Rotation Stretch in Doorway - 2 x daily - 7 x weekly - 1 sets - 3 reps - 30 hold Standing Shoulder Internal Rotation Stretch with Towel - 2 x daily - 7 x weekly - 1 sets - 3 reps - 30 hold     ASSESSMENT:   CLINICAL IMPRESSION: Patient is a 21 y.o. M who was seen  today for physical therapy evaluation and treatment for  S/P arthroscopy of right shoulder-05/05/21: Procedure: reverse Hill-Sachs allografting, biceps tenodesis . Objective impairments include decreased ROM, decreased strength, impaired UE functional use, and pain. These impairments are limiting patient from  playing recreational basketball . Pt is currently experiencing R shoulder pain at the apex of R UE elevation when shooting a basketball. Personal factors including Time since onset of injury/illness/exacerbation are also affecting patient's functional outcome. Patient will benefit from skilled PT to address above impairments and improve overall function.   REHAB POTENTIAL: Excellent   CLINICAL DECISION MAKING: Stable/uncomplicated   EVALUATION COMPLEXITY: Low     GOALS:   SHORT TERM GOALS=LTGs     LONG TERM GOALS:    LTG Name Target Date Goal status  1 Pt will be Ind in a final HEP to maintain achieved level of function Baseline: 10/03/21 INITIAL  2 Increase R shoulder AROM: flexion 140d, abd 140d, IR T10, ER T7 for improved R shoulder function Baseline: flex 120, abd  115, IR L1, ER T5 10/03/21  INITIAL  3 Increase R shoulder strength to 4+/5 or greater for improved R shoulder function Baseline:flex, abd, ER, IR 4/5 10/03/21 INITIAL  4 Pt will be able to shoot a basketball s experiencing R shoulder pain  Baseline: 10/03/21 INITIAL    PLAN: PT FREQUENCY: 2x/week   PT DURATION: 8 weeks   PLANNED INTERVENTIONS: Therapeutic exercises, Therapeutic activity, Neuro Muscular re-education, Patient/Family education, Joint mobilization, Dry Needling, Cryotherapy, Moist heat, Taping, Vasopneumatic device, Ultrasound, Ionotophoresis 4mg /ml Dexamethasone, and Manual therapy   PLAN FOR NEXT SESSION: Assess response to HEP, progress therex to strengthening exs and add to HEP, utilize manual therpay and modalities as indicated    [employer] 08/12/2021, 6:15 AM

## 2021-08-14 ENCOUNTER — Ambulatory Visit (HOSPITAL_COMMUNITY)
Admission: RE | Admit: 2021-08-14 | Discharge: 2021-08-14 | Disposition: A | Discharge status: Home / Self Care | Payer: Medicaid Other | Source: Ambulatory Visit | Attending: Family Medicine | Admitting: Family Medicine

## 2021-08-14 ENCOUNTER — Other Ambulatory Visit: Payer: Self-pay

## 2021-08-14 ENCOUNTER — Ambulatory Visit: Payer: Medicaid Other

## 2021-08-14 DIAGNOSIS — E538 Deficiency of other specified B group vitamins: Secondary | ICD-10-CM | POA: Insufficient documentation

## 2021-08-14 DIAGNOSIS — R1110 Vomiting, unspecified: Secondary | ICD-10-CM | POA: Insufficient documentation

## 2021-08-14 IMAGING — CR DG UGI W/ SMALL BOWEL
1 series · 1 of 1 positions shown · non-contrast
Comparison: CT stone study of [DATE]

CLINICAL DATA: Vomiting. Decreased appetite. Weight loss. Rule out
Crohn disease.

EXAM:
UPPER GI SERIES WITH SMALL BOWEL FOLLOW-THROUGH
FLUOROSCOPY TIME:  Fluoroscopy Time:  2 minutes and 24 seconds
Radiation Exposure Index (if provided by the fluoroscopic device):
Not applicable.
Number of Acquired Spot Images: 0
TECHNIQUE: Combined double contrast and single contrast upper GI series using
effervescent crystals, thick barium, and thin barium. Subsequently,
serial images of the small bowel were obtained including spot views
of the terminal ileum.

[t abdomen supine]
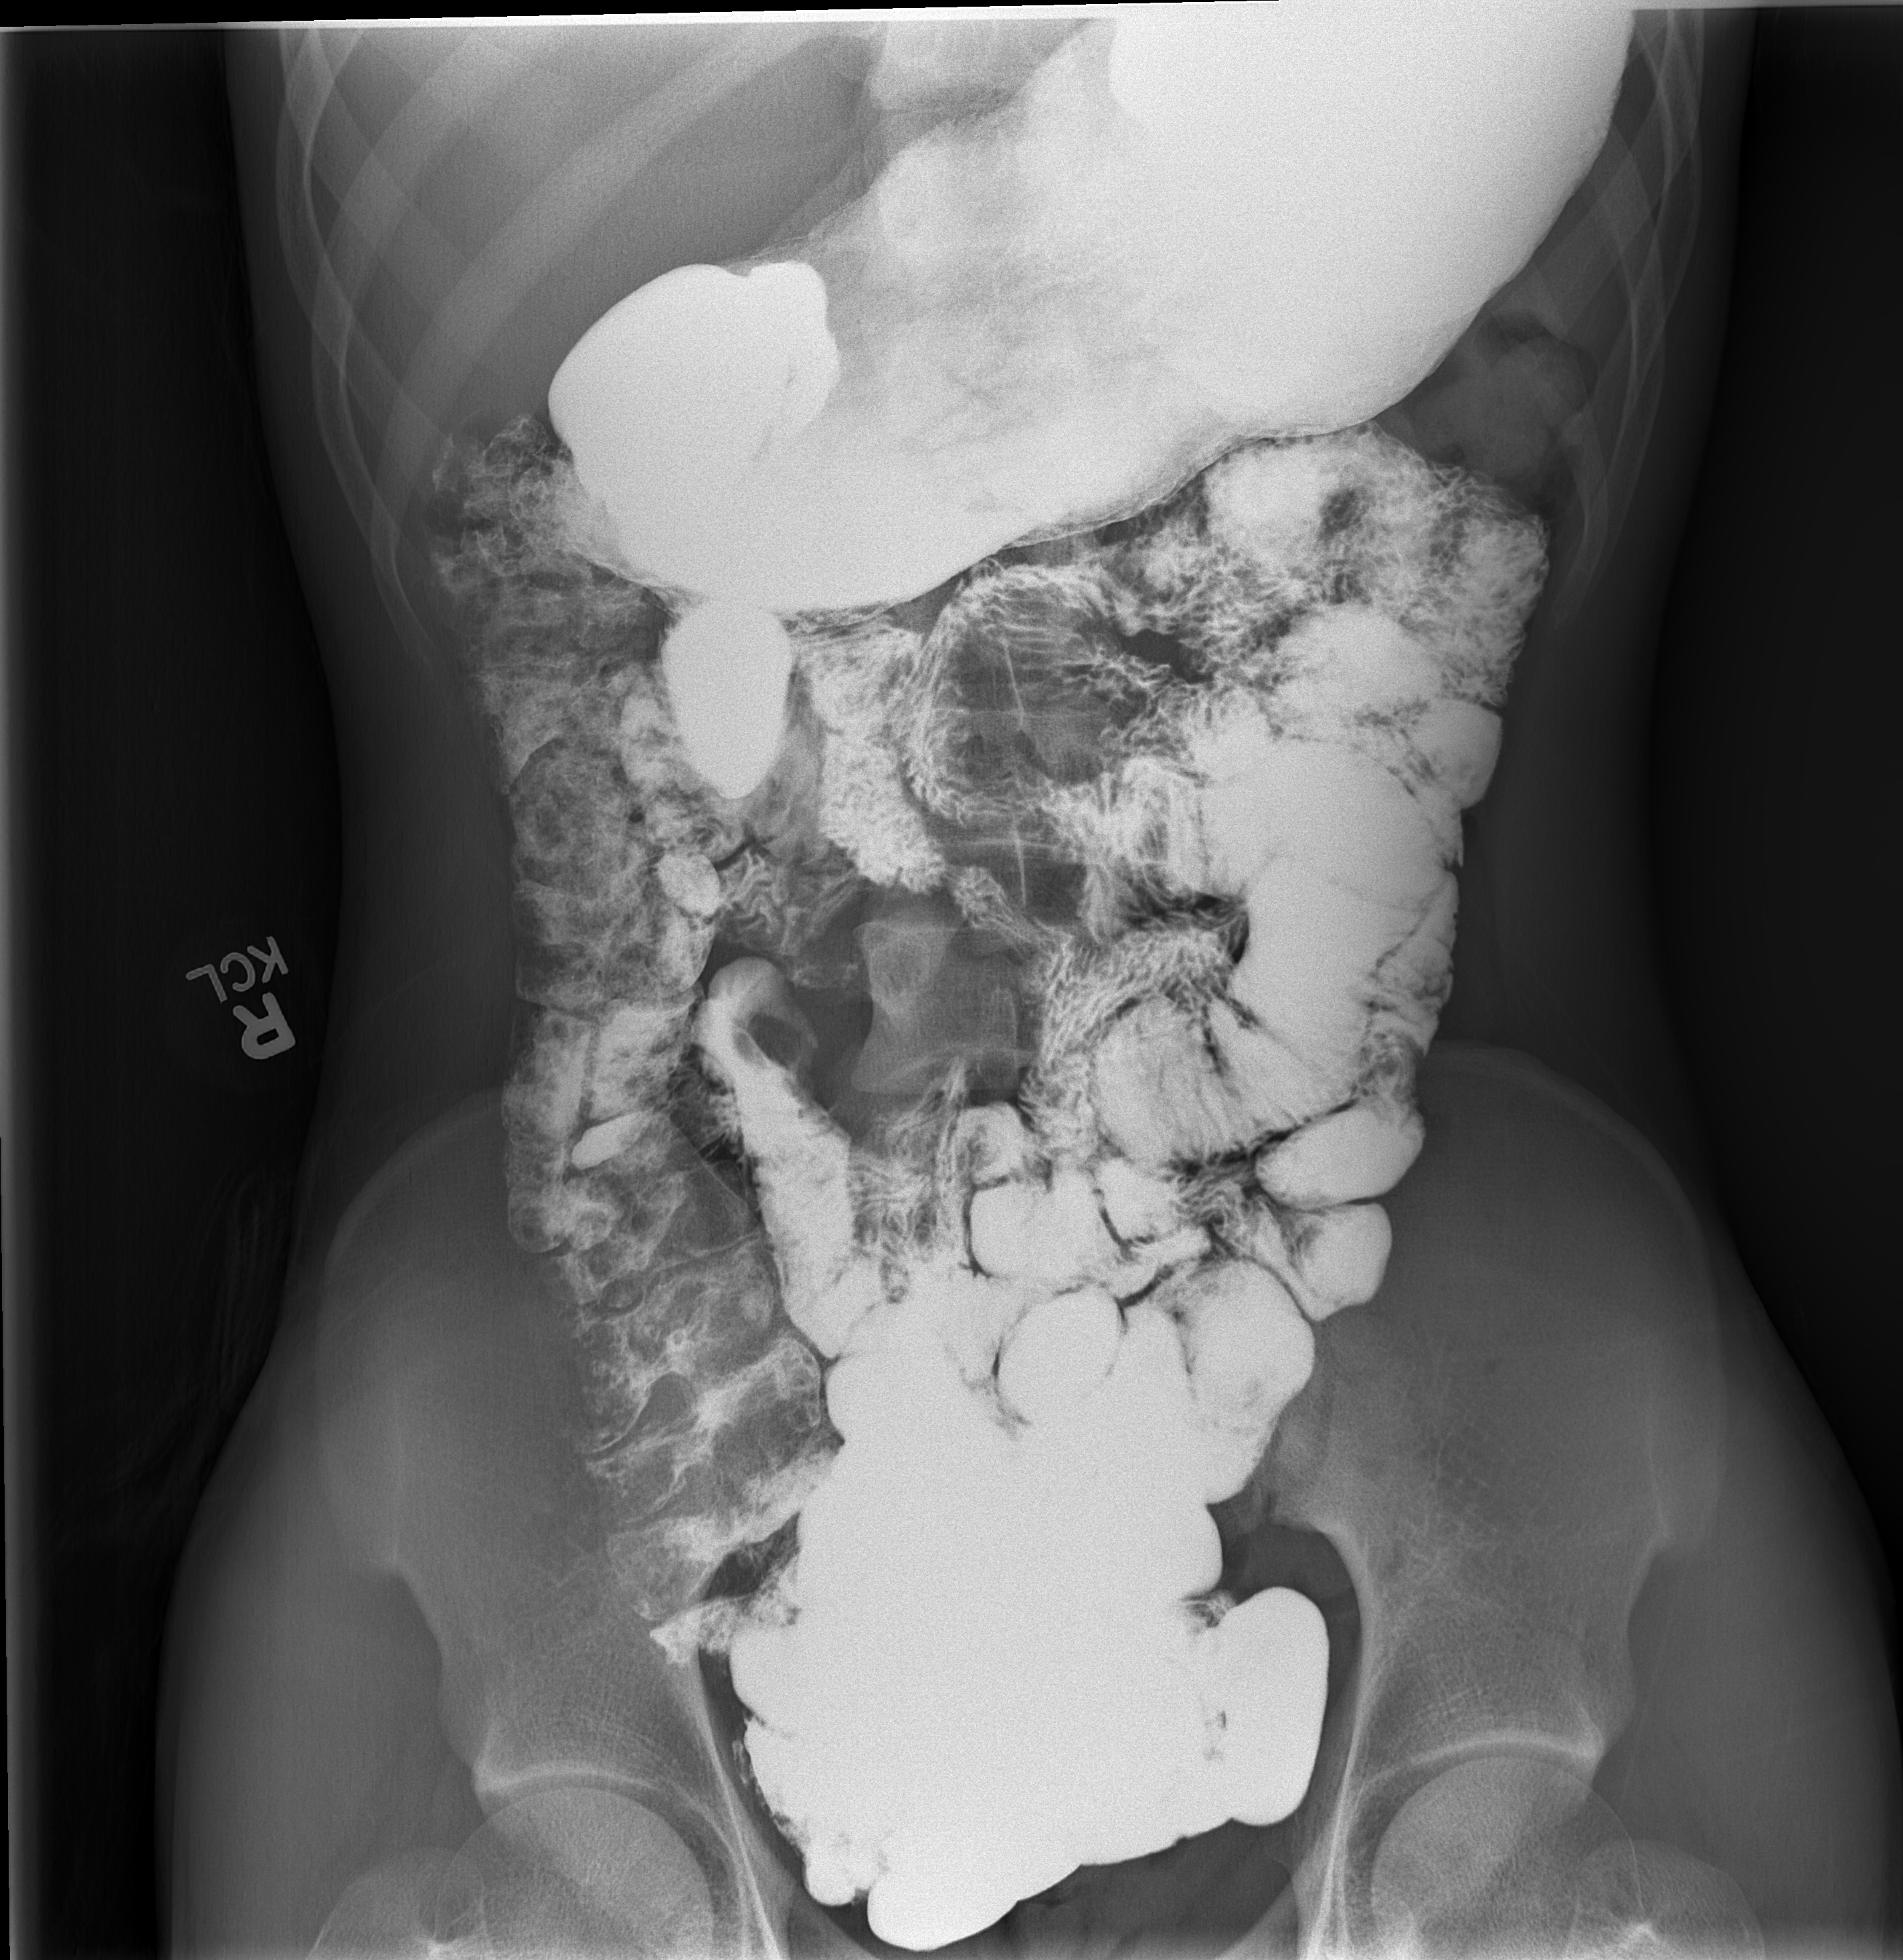

[1 of 1 positions shown; findings below may reference images not displayed]

FINDINGS: Preprocedure scout film demonstrates a nonobstructive bowel-gas
pattern.

Double contrast evaluation of the esophagus demonstrates no mucosal
abnormality.

Double contrast evaluation of the stomach demonstrates no mass,
ulcer. Prompt passage of contrast into the duodenal bulb and C-loop.

Small-bowel follow-through images demonstrate normal small bowel
caliber, fold pattern. Despite multiple maneuvers, the terminal
ileum is difficult to localize. May be identified on series 5. Given
this limitation, no terminal ileitis identified.
IMPRESSION: Suboptimal delineation of the terminal ileum, limiting evaluation.
Otherwise, normal upper GI/small bowel follow-through, without
explanation for patient's symptoms.

CT enterography may be of increased sensitivity for Crohn disease.

## 2021-08-14 IMAGING — RF DG UGI W/ SMALL BOWEL
11 of 17 series · 14 of 23 positions shown · non-contrast
Comparison: CT stone study of [DATE]

CLINICAL DATA: Vomiting. Decreased appetite. Weight loss. Rule out
Crohn disease.

EXAM:
UPPER GI SERIES WITH SMALL BOWEL FOLLOW-THROUGH
FLUOROSCOPY TIME:  Fluoroscopy Time:  2 minutes and 24 seconds
Radiation Exposure Index (if provided by the fluoroscopic device):
Not applicable.
Number of Acquired Spot Images: 0
TECHNIQUE: Combined double contrast and single contrast upper GI series using
effervescent crystals, thick barium, and thin barium. Subsequently,
serial images of the small bowel were obtained including spot views
of the terminal ileum.

[Series 1: cp_standard · 0.20mm/px · 1 of 1 slices shown (1 of 10)]
[im 1/1]
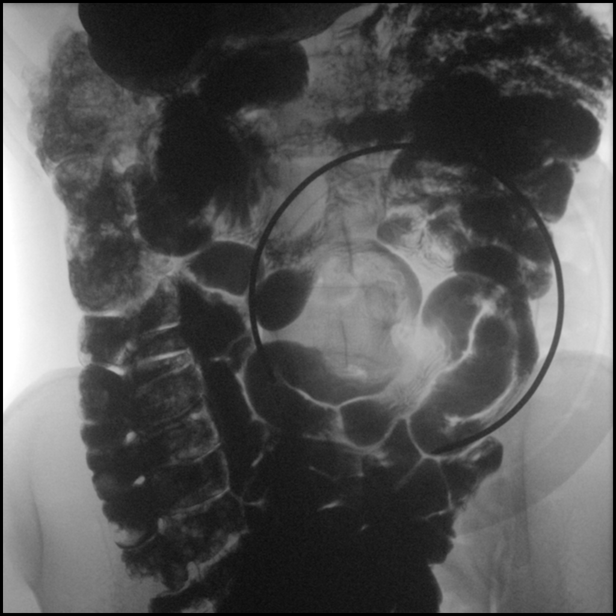

[Series 2: cp_standard · 0.20mm/px · 1 of 1 slices shown (2 of 10)]
[im 1/1]
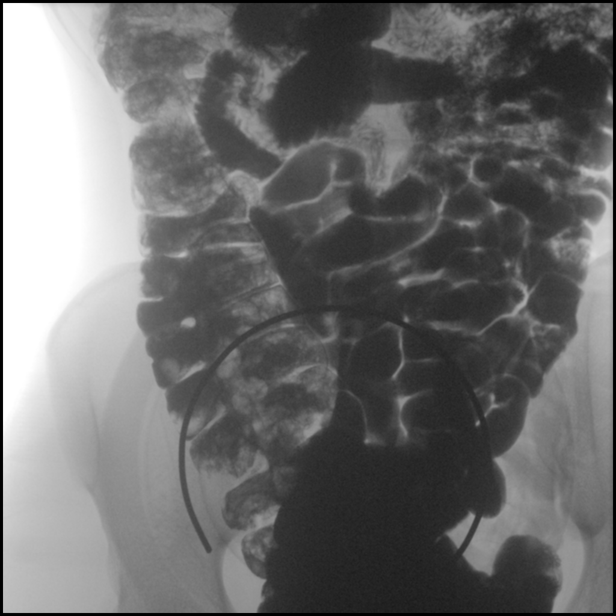

[Series 2: cp_standard · 2 of 136 frames shown (3 of 10)]
[frame 21/136]
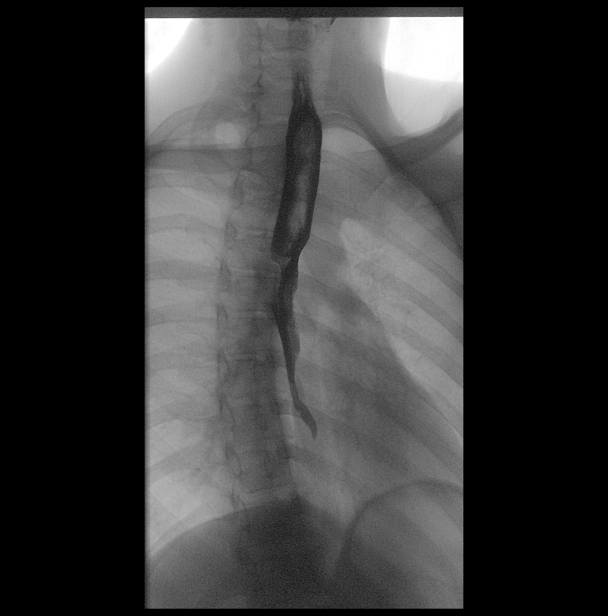
[frame 69/136]
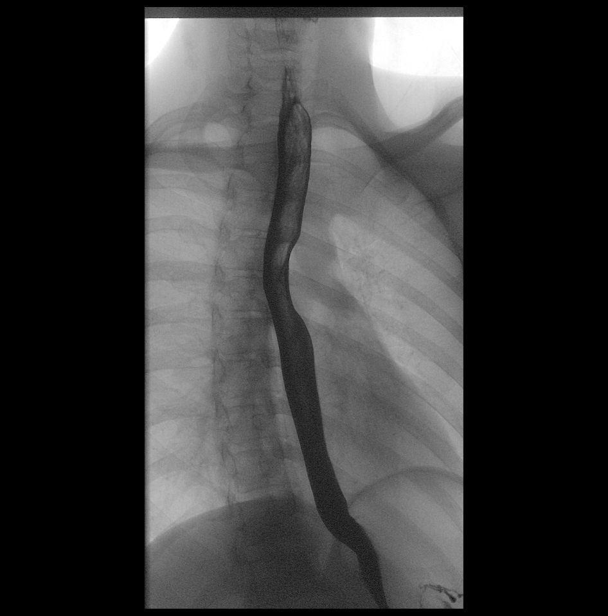

[Series 3: cp_standard · 1 of 1 slices shown (4 of 10)]
[im 1/1]
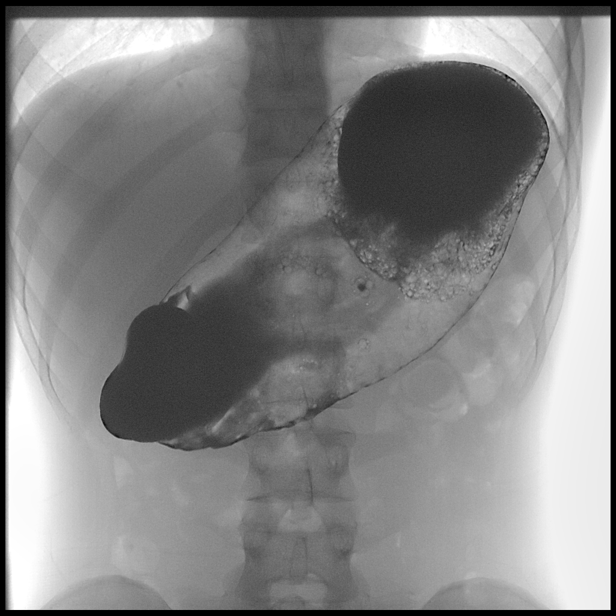

[Series 4: cp_standard · 1 of 1 slices shown (5 of 10)]
[im 1/1]
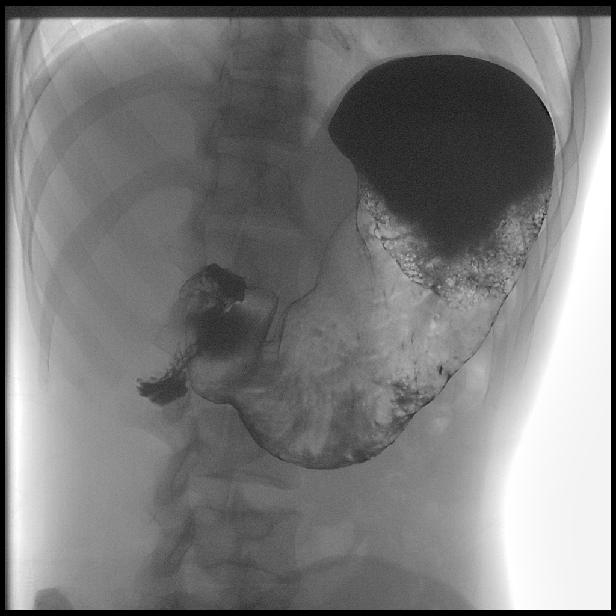

[Series 4: cp_standard · 0.20mm/px · 1 of 1 slices shown (6 of 10)]
[im 1/1]
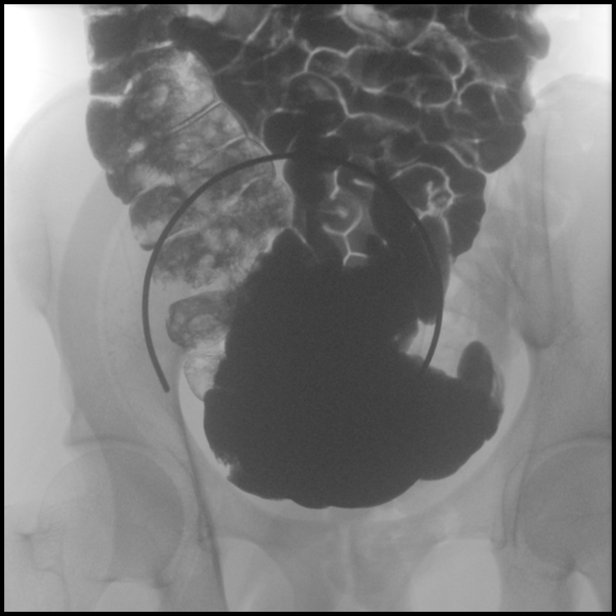

[Series 5: cp_standard · 1 of 1 slices shown (7 of 10)]
[im 1/1]
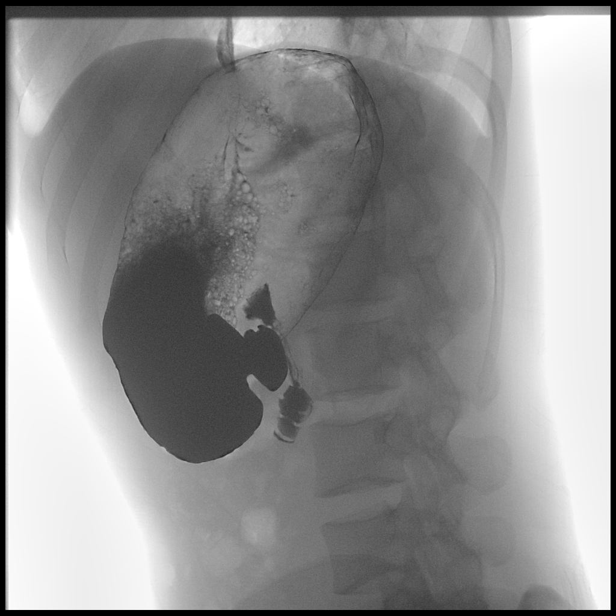

[Series 6: cp_standard · 1 of 1 slices shown (8 of 10)]
[im 1/1]
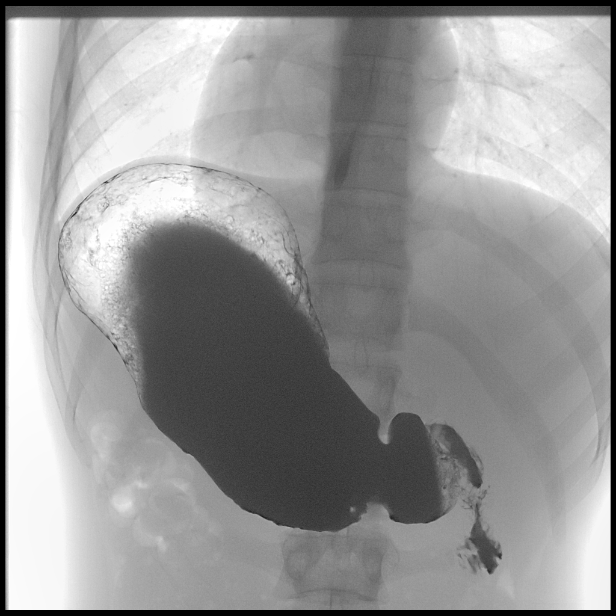

[Series 8: cp_standard · 3 of 237 frames shown (9 of 10)]
[frame 36/237]
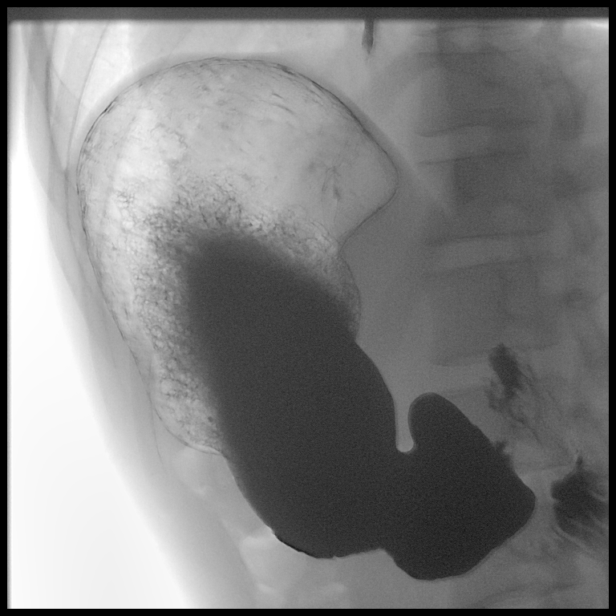
[frame 119/237]
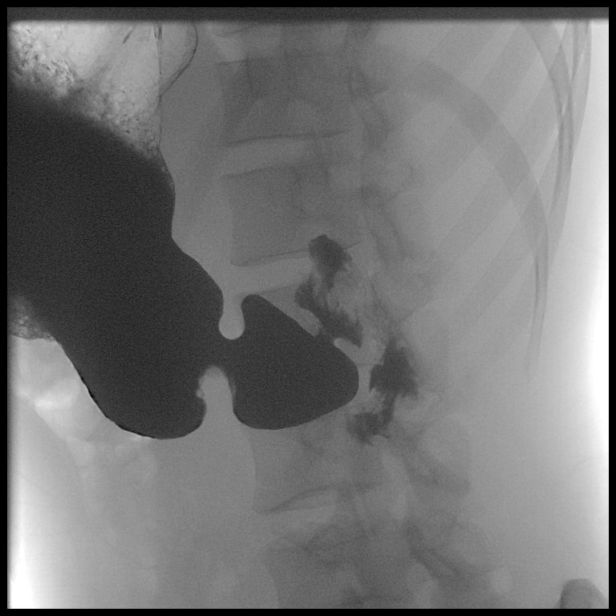
[frame 202/237]
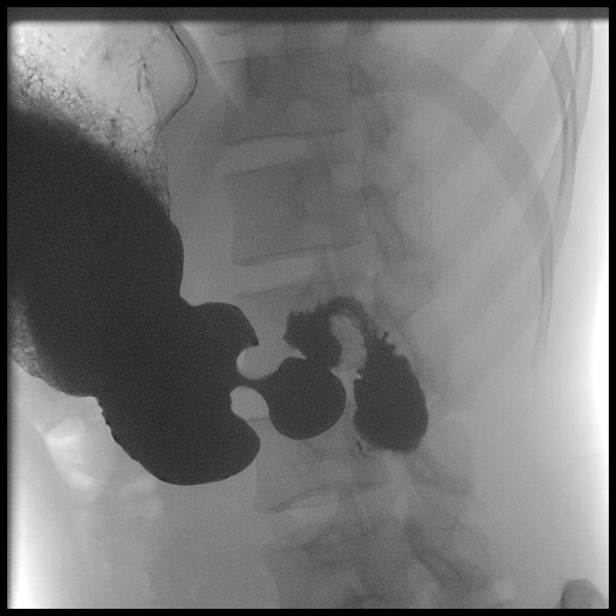

[Series 10: cp_standard · 1 of 1 slices shown (10 of 10)]
[im 1/1]
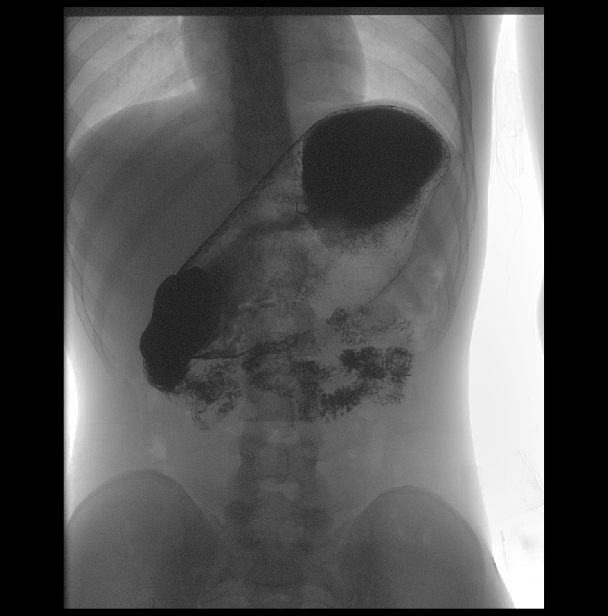

[Series 12: t abdomen · 1 of 1 slices shown]
[im 1/1]
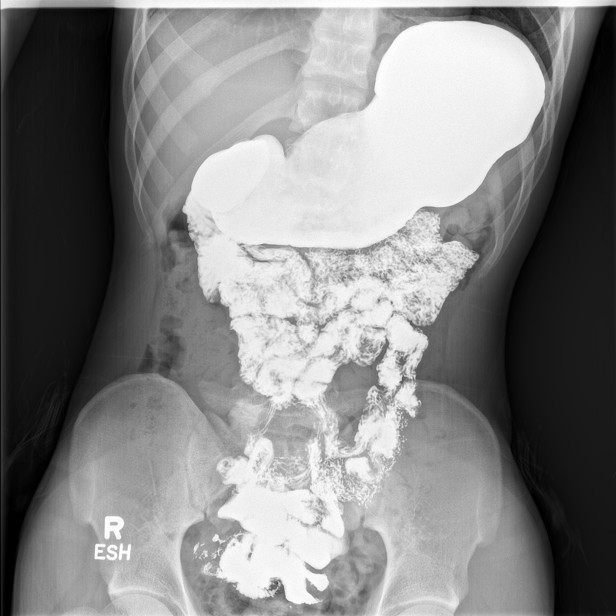

[14 of 23 positions shown; findings below may reference images not displayed]

FINDINGS: Preprocedure scout film demonstrates a nonobstructive bowel-gas
pattern.

Double contrast evaluation of the esophagus demonstrates no mucosal
abnormality.

Double contrast evaluation of the stomach demonstrates no mass,
ulcer. Prompt passage of contrast into the duodenal bulb and C-loop.

Small-bowel follow-through images demonstrate normal small bowel
caliber, fold pattern. Despite multiple maneuvers, the terminal
ileum is difficult to localize. May be identified on series 5. Given
this limitation, no terminal ileitis identified.
IMPRESSION: Suboptimal delineation of the terminal ileum, limiting evaluation.
Otherwise, normal upper GI/small bowel follow-through, without
explanation for patient's symptoms.

CT enterography may be of increased sensitivity for Crohn disease.

## 2021-08-14 NOTE — Therapy (Incomplete)
OUTPATIENT PHYSICAL THERAPY TREATMENT NOTE   Patient Name: Terrence Riley MRN: [id number] DOB:May 26, 2001, 21 y.o., male Today's Date: 08/14/2021  PCP: Eulas Post, MD REFERRING PROVIDER: [name], MD    Past Medical History:  Diagnosis Date   Allergy    rhinitis   Asthma    as a child   Complication of anesthesia    pt. reports he need more anesthesia with the septoplasty surgery   Eosinophilic esophagitis    heartburn and reflux   Family history of adverse reaction to anesthesia    mother respiratory failure   Headache    Nasal fracture    deviated septum   Pneumonia    1x history of   Premature baby    2 months early   Seizures ([place])    well controlled on meds/ one 2 months ago when meds were late   Past Surgical History:  Procedure Laterality Date   ADENOIDECTOMY     CLOSED REDUCTION NASAL FRACTURE N/A 08/31/2017   Procedure: CLOSED REDUCTION NASAL FRACTURE;  Surgeon: Clyde Canterbury, MD;  Location: [place];  Service: ENT;  Laterality: N/A;   ORIF HUMERUS FRACTURE Right 05/05/2021   Procedure: reverse Hill-Sachs allografting, biceps tenodesis;  Surgeon: [name], MD;  Location: [place];  Service: Orthopedics;  Laterality: Right;   SEPTOPLASTY N/A 08/31/2017   Procedure: SEPTOPLASTY;  Surgeon: Clyde Canterbury, MD;  Location: [place];  Service: ENT;  Laterality: N/A;   SHOULDER ARTHROSCOPY WITH LABRAL REPAIR Left 04/30/2020   Procedure: LEFT SHOULDER POSTERIOR LABRAL REPAIR WITH ARTHROSCOPY, BICEPS TENDON RELEASE AND TENODESIS, OPEN ALLOGRAFT FOR REVERSE BANKART LESION;  Surgeon: [name], MD;  Location: [place];  Service: Orthopedics;  Laterality: Left;   SHOULDER ARTHROSCOPY WITH LABRAL REPAIR Right 05/05/2021   Procedure: right shoulder arthroscopy, biceps release, posterior labral repair;;  Surgeon: [name], MD;  Location: [place];  Service: Orthopedics;  Laterality: Right;   TONSILLECTOMY     and  addenoids   Patient Active Problem List   Diagnosis Date Noted   Hypokalemia 06/26/2021   B12 deficiency 06/26/2021   Rhabdomyolysis 06/26/2021   Acute encephalopathy 06/20/2021   Aspiration pneumonia ([place]) 06/20/2021   Febrile illness    Shoulder instability, right    Humeral head fracture, right, with malunion, subsequent encounter    Esophagitis determined by endoscopy 07/24/2020   Labral tear of shoulder, left, subsequent encounter 02/13/2020   Shoulder dislocation, left, subsequent encounter 02/13/2020   Seizure disorder ([place]) 12/22/2019   Migraines 12/22/2019   Mild intermittent asthma 12/22/2019   Migraine without aura and without status migrainosus, not intractable 06/29/2019   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   Altered mental status 03/12/2014   Lethargic 03/09/2014   Environmental allergies 03/01/2014   Gastroesophageal reflux disease with esophagitis 10/06/2013   Localization-related (focal) (partial) symptomatic epilepsy and epileptic syndromes with complex partial seizures, not intractable, without status epilepticus ([place]) 10/06/2013   Spells 07/26/2013   Allergy to other foods 07/13/2013   Prophylactic immunotherapy 07/13/2013   Unspecified asthma, uncomplicated 40/98/1191   Eosinophilic esophagitis 47/82/9562   Gastroesophageal reflux 01/23/2013   Vomiting 01/23/2013   Leukopenia 10/19/2012   Muscle jerks during sleep 10/19/2012   Constipation 08/20/2012   Neutropenia ([place]) 08/20/2012   Allergic rhinitis due to pollen 02/19/2011    REFERRING DIAG: S/P arthroscopy of right shoulder- 05/05/21: Procedure: reverse Hill-Sachs allografting, biceps tenodesis   THERAPY DIAG:  No diagnosis found.  PERTINENT HISTORY: ***  PRECAUTIONS: ***  SUBJECTIVE: ***  PAIN:  Are you having pain? {yes/no:20286} NPRS scale: ***/10 Pain location: *** Pain orientation: {Pain Orientation:25161}  PAIN TYPE: {type:313116} Pain description: {PAIN DESCRIPTION:21022940}   Aggravating factors: *** Relieving factors: ***     (Copy Today's treatment to Plan section here) OBJECTIVE:    DIAGNOSTIC FINDINGS:  IMPRESSION: 03/07/21 1. Posterior labral tear and fracture of the posterior glenoid. Abnormal concavity involving the anteromedial aspect of the humeral head and bone marrow edema consistent with a reverse Hill Sachs lesion. Overall findings are consistent with sequela posterior shoulder dislocation.   COGNITION:          Overall cognitive status: Within functional limits for tasks assessed                               SENSATION:          Light touch: Appears intact   POSTURE: Bilat winging scapula   PALPATION: NT   UPPER EXTREMITY AROM/PROM:   A/PROM Right 08/02/2021 Left 08/02/2021  Shoulder flexion 120/140 140/160  Shoulder extension      Shoulder abduction 115/120 125/160  Shoulder adduction      Shoulder internal rotation L1/40 T8/63  Shoulder external rotation T5/65 T7/90  Elbow flexion      Elbow extension      Wrist flexion      Wrist extension      Wrist ulnar deviation      Wrist radial deviation      Wrist pronation      Wrist supination      (Blank rows = not tested) Tendency for compensation for R shoulder elevation with trunk ext. Pt experiences R shoulder pain at the endrange of shoulder flexion.   UPPER EXTREMITY MMT:   MMT Right 08/02/2021 Left 08/02/2021  Shoulder flexion 4 4+  Shoulder extension 5 5  Shoulder abduction 4 4+  Shoulder adduction 5 5  IR 4 5  ER 4 5  Elbow flexion 4 5  Elbow extension 4 5  Wrist flexion      Wrist extension      Wrist ulnar deviation      Wrist radial deviation      Wrist pronation      Wrist supination      Grip strength (lbs)      (Blank rows = not tested)            TODAY'S TREATMENT:  Shoulder flex stretch on wall x2, 30" Shoulder ER stretch in doorway x2, 30" Shoulder IR stretch c towel x2, 30"   PATIENT EDUCATION: Education details: Eval findings, POC,  HEP Person educated: Patient Education method: Explanation, Demonstration, Tactile cues, Verbal cues, and Handouts Education comprehension: verbalized understanding, returned demonstration, verbal cues required, tactile cues required, and needs further education     HOME EXERCISE PROGRAM: Access Code: [facility] URL: [email or web address] Date: 08/02/2021 Prepared by: Gar Ponto   Exercises Standing Single Arm Shoulder Flexion Stretch on Wall (Mirrored) - 2 x daily - 7 x weekly - 1 sets - 3 reps - 30 hold Standing Shoulder External Rotation Stretch in Doorway - 2 x daily - 7 x weekly - 1 sets - 3 reps - 30 hold Standing Shoulder Internal Rotation Stretch with Towel - 2 x daily - 7 x weekly - 1 sets - 3 reps - 30 hold     ASSESSMENT:   CLINICAL IMPRESSION: Patient is a 21 y.o. M who was seen  today for physical therapy evaluation and treatment for  S/P arthroscopy of right shoulder-05/05/21: Procedure: reverse Hill-Sachs allografting, biceps tenodesis . Objective impairments include decreased ROM, decreased strength, impaired UE functional use, and pain. These impairments are limiting patient from  playing recreational basketball . Pt is currently experiencing R shoulder pain at the apex of R UE elevation when shooting a basketball. Personal factors including Time since onset of injury/illness/exacerbation are also affecting patient's functional outcome. Patient will benefit from skilled PT to address above impairments and improve overall function.   REHAB POTENTIAL: Excellent   CLINICAL DECISION MAKING: Stable/uncomplicated   EVALUATION COMPLEXITY: Low     GOALS:   SHORT TERM GOALS=LTGs     LONG TERM GOALS:    LTG Name Target Date Goal status  1 Pt will be Ind in a final HEP to maintain achieved level of function Baseline: 10/03/21 INITIAL  2 Increase R shoulder AROM: flexion 140d, abd 140d, IR T10, ER T7 for improved R shoulder function Baseline: flex 120, abd  115, IR L1, ER T5 10/03/21  INITIAL  3 Increase R shoulder strength to 4+/5 or greater for improved R shoulder function Baseline:flex, abd, ER, IR 4/5 10/03/21 INITIAL  4 Pt will be able to shoot a basketball s experiencing R shoulder pain  Baseline: 10/03/21 INITIAL    PLAN: PT FREQUENCY: 2x/week   PT DURATION: 8 weeks   PLANNED INTERVENTIONS: Therapeutic exercises, Therapeutic activity, Neuro Muscular re-education, Patient/Family education, Joint mobilization, Dry Needling, Cryotherapy, Moist heat, Taping, Vasopneumatic device, Ultrasound, Ionotophoresis 4mg /ml Dexamethasone, and Manual therapy   PLAN FOR NEXT SESSION: Assess response to HEP, progress therex to strengthening exs and add to HEP, utilize manual therpay and modalities as indicated    [employer] 08/14/2021, 10:46 AM

## 2021-08-19 ENCOUNTER — Other Ambulatory Visit: Payer: Self-pay

## 2021-08-19 ENCOUNTER — Ambulatory Visit: Payer: Medicaid Other

## 2021-08-19 DIAGNOSIS — M25611 Stiffness of right shoulder, not elsewhere classified: Secondary | ICD-10-CM | POA: Diagnosis not present

## 2021-08-19 DIAGNOSIS — M6281 Muscle weakness (generalized): Secondary | ICD-10-CM

## 2021-08-19 DIAGNOSIS — M25612 Stiffness of left shoulder, not elsewhere classified: Secondary | ICD-10-CM

## 2021-08-19 DIAGNOSIS — R293 Abnormal posture: Secondary | ICD-10-CM

## 2021-08-19 NOTE — Therapy (Cosign Needed)
OUTPATIENT PHYSICAL THERAPY TREATMENT NOTE   Patient Name: Terrence Riley MRN: [id number] DOB:[birth year], 21 y.o., male Today's Date: 08/19/2021  PCP: Eulas Post, MD REFERRING PROVIDER: [name], MD   PT End of Session - 08/19/21 1432     Visit Number 2    Number of Visits 17    Date for PT Re-Evaluation 10/03/21    Authorization Type [place] MEDICAID [employer] [place]    Progress Note Due on Visit 10    PT Start Time 1422    PT Stop Time 1502    PT Time Calculation (min) 40 min    Activity Tolerance Patient tolerated treatment well    Behavior During Therapy WFL for tasks assessed/performed             Past Medical History:  Diagnosis Date   Allergy    rhinitis   Asthma    as a child   Complication of anesthesia    pt. reports he need more anesthesia with the septoplasty surgery   Eosinophilic esophagitis    heartburn and reflux   Family history of adverse reaction to anesthesia    mother respiratory failure   Headache    Nasal fracture    deviated septum   Pneumonia    1x history of   Premature baby    2 months early   Seizures ([place])    well controlled on meds/ one 2 months ago when meds were late   Past Surgical History:  Procedure Laterality Date   ADENOIDECTOMY     CLOSED REDUCTION NASAL FRACTURE N/A 08/31/2017   Procedure: CLOSED REDUCTION NASAL FRACTURE;  Surgeon: Clyde Canterbury, MD;  Location: [place];  Service: ENT;  Laterality: N/A;   ORIF HUMERUS FRACTURE Right 05/05/2021   Procedure: reverse Hill-Sachs allografting, biceps tenodesis;  Surgeon: [name], MD;  Location: [place];  Service: Orthopedics;  Laterality: Right;   SEPTOPLASTY N/A 08/31/2017   Procedure: SEPTOPLASTY;  Surgeon: Clyde Canterbury, MD;  Location: [place];  Service: ENT;  Laterality: N/A;   SHOULDER ARTHROSCOPY WITH LABRAL REPAIR Left 04/30/2020   Procedure: LEFT SHOULDER POSTERIOR LABRAL REPAIR WITH ARTHROSCOPY, BICEPS TENDON  RELEASE AND TENODESIS, OPEN ALLOGRAFT FOR REVERSE BANKART LESION;  Surgeon: [name], MD;  Location: [place];  Service: Orthopedics;  Laterality: Left;   SHOULDER ARTHROSCOPY WITH LABRAL REPAIR Right 05/05/2021   Procedure: right shoulder arthroscopy, biceps release, posterior labral repair;;  Surgeon: [name], MD;  Location: [place];  Service: Orthopedics;  Laterality: Right;   TONSILLECTOMY     and addenoids   Patient Active Problem List   Diagnosis Date Noted   Hypokalemia 06/26/2021   B12 deficiency 06/26/2021   Rhabdomyolysis 06/26/2021   Acute encephalopathy 06/20/2021   Aspiration pneumonia ([place]) 06/20/2021   Febrile illness    Shoulder instability, right    Humeral head fracture, right, with malunion, subsequent encounter    Esophagitis determined by endoscopy 07/24/2020   Labral tear of shoulder, left, subsequent encounter 02/13/2020   Shoulder dislocation, left, subsequent encounter 02/13/2020   Seizure disorder ([place]) 12/22/2019   Migraines 12/22/2019   Mild intermittent asthma 12/22/2019   Migraine without aura and without status migrainosus, not intractable 06/29/2019   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   Altered mental status 03/12/2014   Lethargic 03/09/2014   Environmental allergies 03/01/2014   Gastroesophageal reflux disease with esophagitis 10/06/2013   Localization-related (focal) (partial) symptomatic epilepsy and epileptic syndromes with complex partial seizures, not  intractable, without status epilepticus ([place]) 10/06/2013   Spells 07/26/2013   Allergy to other foods 07/13/2013   Prophylactic immunotherapy 07/13/2013   Unspecified asthma, uncomplicated 41/32/4401   Eosinophilic esophagitis 02/72/5366   Gastroesophageal reflux 01/23/2013   Vomiting 01/23/2013   Leukopenia 10/19/2012   Muscle jerks during sleep 10/19/2012   Constipation 08/20/2012   Neutropenia ([place]) 08/20/2012   Allergic rhinitis due to pollen 02/19/2011    REFERRING DIAG:  S/P arthroscopy of right shoulder- 05/05/21: Procedure: reverse Hill-Sachs allografting, biceps tenodesis   THERAPY DIAG:  Decreased ROM of right shoulder  Muscle weakness (generalized)  Stiffness of left shoulder, not elsewhere classified  Abnormal posture  PERTINENT HISTORY: LEFT SHOULDER POSTERIOR LABRAL REPAIR WITH ARTHROSCOPY  PRECAUTIONS: Shoulder: No crossed arm adduction and no posterior capsular stretching.  2 times a week for 4 weeks.  Follow-up with me in 8 weeks.  I think is okay for him to do some shooting and dribbling and recreational play beginning at the earliest in the January which would be 3 months out after procedure  SUBJECTIVE: Pt reports his R shoulder is doing better. No pain at the endrange of his jump shot. Pt reports he is playing basketball 4 to 5 days a week.  PAIN:  Are you having pain? No NPRS scale: 0/10 Pain location: Shoulder Pain orientation: Right  PAIN TYPE: NA Pain description: NA  Aggravating factors: NA Relieving factors: NA  OBJECTIVE:    DIAGNOSTIC FINDINGS:  IMPRESSION: 03/07/21 1. Posterior labral tear and fracture of the posterior glenoid. Abnormal concavity involving the anteromedial aspect of the humeral head and bone marrow edema consistent with a reverse Hill Sachs lesion. Overall findings are consistent with sequela posterior shoulder dislocation.   COGNITION:          Overall cognitive status: Within functional limits for tasks assessed                               SENSATION:          Light touch: Appears intact   POSTURE: Bilat winging scapula   PALPATION: NT   UPPER EXTREMITY AROM/PROM:   A/PROM Right 08/02/2021 Left 08/02/2021  Shoulder flexion 120/140 140/160  Shoulder extension      Shoulder abduction 115/120 125/160  Shoulder adduction      Shoulder internal rotation L1/40 T8/63  Shoulder external rotation T5/65 T7/90  Elbow flexion      Elbow extension      Wrist flexion      Wrist extension       Wrist ulnar deviation      Wrist radial deviation      Wrist pronation      Wrist supination      (Blank rows = not tested) Tendency for compensation for R shoulder elevation with trunk ext. Pt experiences R shoulder pain at the endrange of shoulder flexion.   UPPER EXTREMITY MMT:   MMT Right 08/02/2021 Left 08/02/2021  Shoulder flexion 4 4+  Shoulder extension 5 5  Shoulder abduction 4 4+  Shoulder adduction 5 5  IR 4 5  ER 4 5  Elbow flexion 4 5  Elbow extension 4 5  Wrist flexion      Wrist extension      Wrist ulnar deviation      Wrist radial deviation      Wrist pronation      Wrist supination      Grip strength (lbs)      (  Blank rows = not tested)  [facility] Adult PT Treatment:                                                DATE: 1/24.23  Therapeutic Exercise:  UBE L1, 5 mins, each direction Shoulder flex stretch on wall x2, 30" Shoulder ER stretch in doorway x2, 30" Shoulder IR stretch c towel x2, 30" Shoulder row 2x15, GTB Shoulder press/serratus press 2x15, GTB Shoulder ER 2x15. GTB Shoulder IR 2x15, GTB  Self Care: Updated HEP, stretching exs daily, strengthening exs every other day            TODAY'S TREATMENT: Eval 08/01/21 Shoulder flex stretch on wall x2, 30" Shoulder ER stretch in doorway x2, 30" Shoulder IR stretch c towel x2, 30"   PATIENT EDUCATION: Education details: Eval findings, POC, HEP Person educated: Patient Education method: Explanation, Demonstration, Tactile cues, Verbal cues, and Handouts Education comprehension: verbalized understanding, returned demonstration, verbal cues required, tactile cues required, and needs further education     HOME EXERCISE PROGRAM: Access Code: [facility] URL: [email or web address] Date: 08/19/2021 Prepared by: Gar Ponto  Exercises Standing Single Arm Shoulder Flexion Stretch on Wall (Mirrored) - 2 x daily - 7 x weekly - 1 sets - 3 reps - 30 hold Standing Shoulder External Rotation Stretch  in Doorway - 2 x daily - 7 x weekly - 1 sets - 3 reps - 30 hold Standing Shoulder Internal Rotation Stretch with Towel - 2 x daily - 7 x weekly - 1 sets - 3 reps - 30 hold Shoulder External Rotation with Anchored Resistance - 1 x daily - 7 x weekly - 3 sets - 10 reps - 3 hold Shoulder Internal Rotation with Resistance - 1 x daily - 7 x weekly - 3 sets - 10 reps - 3 hold Standing Shoulder Row with Anchored Resistance - 1 x daily - 7 x weekly - 3 sets - 10 reps - 3 hold Standing Serratus Punch with Resistance - 1 x daily - 7 x weekly - 3 sets - 10 reps - 3 hold      ASSESSMENT:   CLINICAL IMPRESSION:  Pt returns to his 2nd PT appt after 3 weeks. He reports completing his HEP every other day. Pt's R shoulder AROM has improved from 120d to 133d. R shoulder stretching exs were reviewed, pt completing properly. Theraband strengthening exs were completed today, with pt tolerating without adverse effects. Strengthening exs were added to the pt's HEP with pt returning demonstration. Pt will continue to benefit from skilled PT to address ROM and strength deficits to optimize functional use of the R UE.   REHAB POTENTIAL: Excellent   CLINICAL DECISION MAKING: Stable/uncomplicated   EVALUATION COMPLEXITY: Low     GOALS:   SHORT TERM GOALS=LTGs     LONG TERM GOALS:    LTG Name Target Date Goal status  1 Pt will be Ind in a final HEP to maintain achieved level of function Baseline: 10/03/21 INITIAL  2 Increase R shoulder AROM: flexion 140d, abd 140d, IR T10, ER T7 for improved R shoulder function Baseline: flex 120, abd 115, IR L1, ER T5 10/03/21  INITIAL  3 Increase R shoulder strength to 4+/5 or greater for improved R shoulder function Baseline:flex, abd, ER, IR 4/5 10/03/21 INITIAL  4 Pt will be able to shoot a basketball s experiencing  R shoulder pain  Baseline: 10/03/21 INITIAL    PLAN: PT FREQUENCY: 2x/week   PT DURATION: 8 weeks   PLANNED INTERVENTIONS: Therapeutic exercises,  Therapeutic activity, Neuro Muscular re-education, Patient/Family education, Joint mobilization, Dry Needling, Cryotherapy, Moist heat, Taping, Vasopneumatic device, Ultrasound, Ionotophoresis 4mg /ml Dexamethasone, and Manual therapy   PLAN FOR NEXT SESSION: Assess response to HEP, progress therex to strengthening exs and add to HEP, utilize manual therapy and modalities as indicated    [employer] MS, PT 08/19/21 5:08 PM

## 2021-08-20 NOTE — Therapy (Incomplete)
OUTPATIENT PHYSICAL THERAPY TREATMENT NOTE   Patient Name: Terrence Riley MRN: [id number] DOB:04-Feb-2001, 21 y.o., male Today's Date: 08/20/2021  PCP: Eulas Post, MD REFERRING PROVIDER: [name], MD     Past Medical History:  Diagnosis Date   Allergy    rhinitis   Asthma    as a child   Complication of anesthesia    pt. reports he need more anesthesia with the septoplasty surgery   Eosinophilic esophagitis    heartburn and reflux   Family history of adverse reaction to anesthesia    mother respiratory failure   Headache    Nasal fracture    deviated septum   Pneumonia    1x history of   Premature baby    2 months early   Seizures ([place])    well controlled on meds/ one 2 months ago when meds were late   Past Surgical History:  Procedure Laterality Date   ADENOIDECTOMY     CLOSED REDUCTION NASAL FRACTURE N/A 08/31/2017   Procedure: CLOSED REDUCTION NASAL FRACTURE;  Surgeon: Clyde Canterbury, MD;  Location: [place];  Service: ENT;  Laterality: N/A;   ORIF HUMERUS FRACTURE Right 05/05/2021   Procedure: reverse Hill-Sachs allografting, biceps tenodesis;  Surgeon: [name], MD;  Location: [place];  Service: Orthopedics;  Laterality: Right;   SEPTOPLASTY N/A 08/31/2017   Procedure: SEPTOPLASTY;  Surgeon: Clyde Canterbury, MD;  Location: [facility];  Service: ENT;  Laterality: N/A;   SHOULDER ARTHROSCOPY WITH LABRAL REPAIR Left 04/30/2020   Procedure: LEFT SHOULDER POSTERIOR LABRAL REPAIR WITH ARTHROSCOPY, BICEPS TENDON RELEASE AND TENODESIS, OPEN ALLOGRAFT FOR REVERSE BANKART LESION;  Surgeon: [name], MD;  Location: [facility];  Service: Orthopedics;  Laterality: Left;   SHOULDER ARTHROSCOPY WITH LABRAL REPAIR Right 05/05/2021   Procedure: right shoulder arthroscopy, biceps release, posterior labral repair;;  Surgeon: [name], MD;  Location: [facility];  Service: Orthopedics;  Laterality: Right;   TONSILLECTOMY     and  addenoids   Patient Active Problem List   Diagnosis Date Noted   Hypokalemia 06/26/2021   B12 deficiency 06/26/2021   Rhabdomyolysis 06/26/2021   Acute encephalopathy 06/20/2021   Aspiration pneumonia ([place]) 06/20/2021   Febrile illness    Shoulder instability, right    Humeral head fracture, right, with malunion, subsequent encounter    Esophagitis determined by endoscopy 07/24/2020   Labral tear of shoulder, left, subsequent encounter 02/13/2020   Shoulder dislocation, left, subsequent encounter 02/13/2020   Seizure disorder ([place]) 12/22/2019   Migraines 12/22/2019   Mild intermittent asthma 12/22/2019   Migraine without aura and without status migrainosus, not intractable 06/29/2019   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   Altered mental status 03/12/2014   Lethargic 03/09/2014   Environmental allergies 03/01/2014   Gastroesophageal reflux disease with esophagitis 10/06/2013   Localization-related (focal) (partial) symptomatic epilepsy and epileptic syndromes with complex partial seizures, not intractable, without status epilepticus ([place]) 10/06/2013   Spells 07/26/2013   Allergy to other foods 07/13/2013   Prophylactic immunotherapy 07/13/2013   Unspecified asthma, uncomplicated 41/66/0630   Eosinophilic esophagitis 16/07/930   Gastroesophageal reflux 01/23/2013   Vomiting 01/23/2013   Leukopenia 10/19/2012   Muscle jerks during sleep 10/19/2012   Constipation 08/20/2012   Neutropenia ([place]) 08/20/2012   Allergic rhinitis due to pollen 02/19/2011    REFERRING DIAG: S/P arthroscopy of right shoulder- 05/05/21: Procedure: reverse Hill-Sachs allografting, biceps tenodesis   THERAPY DIAG:  No diagnosis found.  PERTINENT HISTORY: LEFT SHOULDER POSTERIOR  LABRAL REPAIR WITH ARTHROSCOPY  PRECAUTIONS: Shoulder: No crossed arm adduction and no posterior capsular stretching.  2 times a week for 4 weeks.  Follow-up with me in 8 weeks.  I think is okay for him to do some shooting and  dribbling and recreational play beginning at the earliest in the January which would be 3 months out after procedure  SUBJECTIVE: ***  PAIN:  Are you having pain? No NPRS scale: 0/10 Pain location: Shoulder Pain orientation: Right  PAIN TYPE: NA Pain description: NA  Aggravating factors: NA Relieving factors: NA  OBJECTIVE:   *Unless otherwise noted, objective information collected previously* DIAGNOSTIC FINDINGS:  IMPRESSION: 03/07/21 1. Posterior labral tear and fracture of the posterior glenoid. Abnormal concavity involving the anteromedial aspect of the humeral head and bone marrow edema consistent with a reverse Hill Sachs lesion. Overall findings are consistent with sequela posterior shoulder dislocation.   COGNITION:          Overall cognitive status: Within functional limits for tasks assessed                               SENSATION:          Light touch: Appears intact   POSTURE: Bilat winging scapula   PALPATION: NT   UPPER EXTREMITY AROM/PROM:   A/PROM Right 08/02/2021 Left 08/02/2021  Shoulder flexion 120/140 140/160  Shoulder extension      Shoulder abduction 115/120 125/160  Shoulder adduction      Shoulder internal rotation L1/40 T8/63  Shoulder external rotation T5/65 T7/90  Elbow flexion      Elbow extension      Wrist flexion      Wrist extension      Wrist ulnar deviation      Wrist radial deviation      Wrist pronation      Wrist supination      (Blank rows = not tested) Tendency for compensation for R shoulder elevation with trunk ext. Pt experiences R shoulder pain at the endrange of shoulder flexion.   UPPER EXTREMITY MMT:   MMT Right 08/02/2021 Left 08/02/2021  Shoulder flexion 4 4+  Shoulder extension 5 5  Shoulder abduction 4 4+  Shoulder adduction 5 5  IR 4 5  ER 4 5  Elbow flexion 4 5  Elbow extension 4 5  Wrist flexion      Wrist extension      Wrist ulnar deviation      Wrist radial deviation      Wrist pronation       Wrist supination      Grip strength (lbs)      (Blank rows = not tested)   [facility] Adult PT Treatment:                                                DATE: 08/21/2021 Therapeutic Exercise: *** Manual Therapy: *** Neuromuscular re-ed: *** Therapeutic Activity: *** Modalities: *** Self Care: [facility] Adult PT Treatment:                                                DATE: 1/24.23  Therapeutic Exercise:  UBE L1, 5 mins, each direction Shoulder flex stretch on wall x2, 30" Shoulder ER stretch in doorway x2, 30" Shoulder IR stretch c towel x2, 30" Shoulder row 2x15, GTB Shoulder press/serratus press 2x15, GTB Shoulder ER 2x15. GTB Shoulder IR 2x15, GTB  Self Care: Updated HEP, stretching exs daily, strengthening exs every other day              PATIENT EDUCATION: Education details: Eval findings, POC, HEP Person educated: Patient Education method: Explanation, Demonstration, Tactile cues, Verbal cues, and Handouts Education comprehension: verbalized understanding, returned demonstration, verbal cues required, tactile cues required, and needs further education     HOME EXERCISE PROGRAM: Access Code: [facility] URL: [email or web address] Date: 08/19/2021 Prepared by: Gar Ponto  Exercises Standing Single Arm Shoulder Flexion Stretch on Wall (Mirrored) - 2 x daily - 7 x weekly - 1 sets - 3 reps - 30 hold Standing Shoulder External Rotation Stretch in Doorway - 2 x daily - 7 x weekly - 1 sets - 3 reps - 30 hold Standing Shoulder Internal Rotation Stretch with Towel - 2 x daily - 7 x weekly - 1 sets - 3 reps - 30 hold Shoulder External Rotation with Anchored Resistance - 1 x daily - 7 x weekly - 3 sets - 10 reps - 3 hold Shoulder Internal Rotation with Resistance - 1 x daily - 7 x weekly - 3 sets - 10 reps - 3 hold Standing Shoulder Row with Anchored Resistance - 1 x daily - 7 x weekly - 3 sets - 10 reps - 3 hold Standing Serratus Punch with Resistance - 1 x  daily - 7 x weekly - 3 sets - 10 reps - 3 hold      ASSESSMENT:   CLINICAL IMPRESSION:  ***   REHAB POTENTIAL: Excellent   CLINICAL DECISION MAKING: Stable/uncomplicated   EVALUATION COMPLEXITY: Low     GOALS:   SHORT TERM GOALS=LTGs     LONG TERM GOALS:    LTG Name Target Date Goal status  1 Pt will be Ind in a final HEP to maintain achieved level of function Baseline: 10/03/21 INITIAL  2 Increase R shoulder AROM: flexion 140d, abd 140d, IR T10, ER T7 for improved R shoulder function Baseline: flex 120, abd 115, IR L1, ER T5 10/03/21  INITIAL  3 Increase R shoulder strength to 4+/5 or greater for improved R shoulder function Baseline:flex, abd, ER, IR 4/5 10/03/21 INITIAL  4 Pt will be able to shoot a basketball s experiencing R shoulder pain  Baseline: 10/03/21 INITIAL    PLAN: PT FREQUENCY: 2x/week   PT DURATION: 8 weeks   PLANNED INTERVENTIONS: Therapeutic exercises, Therapeutic activity, Neuro Muscular re-education, Patient/Family education, Joint mobilization, Dry Needling, Cryotherapy, Moist heat, Taping, Vasopneumatic device, Ultrasound, Ionotophoresis 4mg /ml Dexamethasone, and Manual therapy   PLAN FOR NEXT SESSION: Assess response to HEP, progress therex to strengthening exs and add to HEP, utilize manual therapy and modalities as indicated    Vanessa Geraldine, PT, DPT 08/20/21 3:17 PM

## 2021-08-21 ENCOUNTER — Other Ambulatory Visit: Payer: Self-pay

## 2021-08-21 ENCOUNTER — Ambulatory Visit: Payer: Medicaid Other

## 2021-08-21 DIAGNOSIS — Z9889 Other specified postprocedural states: Secondary | ICD-10-CM

## 2021-08-21 DIAGNOSIS — M25611 Stiffness of right shoulder, not elsewhere classified: Secondary | ICD-10-CM

## 2021-08-21 DIAGNOSIS — M25612 Stiffness of left shoulder, not elsewhere classified: Secondary | ICD-10-CM

## 2021-08-21 DIAGNOSIS — M6281 Muscle weakness (generalized): Secondary | ICD-10-CM

## 2021-08-21 DIAGNOSIS — R293 Abnormal posture: Secondary | ICD-10-CM

## 2021-08-21 NOTE — Therapy (Signed)
Therapy    Incomplete Encounter Date:  08/21/2021   Incomplete      OUTPATIENT PHYSICAL THERAPY TREATMENT NOTE     Patient Name: Terrence Riley MRN: [id number] DOB:2001/02/22, 21 y.o., male Today's Date: 08/20/2021   PCP: Eulas Post, MD REFERRING PROVIDER: [name], MD             Past Medical History:  Diagnosis Date   Allergy      rhinitis   Asthma      as a child   Complication of anesthesia      pt. reports he need more anesthesia with the septoplasty surgery   Eosinophilic esophagitis      heartburn and reflux   Family history of adverse reaction to anesthesia      mother respiratory failure   Headache     Nasal fracture      deviated septum   Pneumonia      1x history of   Premature baby      2 months early   Seizures ([place])      well controlled on meds/ one 2 months ago when meds were late         Past Surgical History:  Procedure Laterality Date   ADENOIDECTOMY       CLOSED REDUCTION NASAL FRACTURE N/A 08/31/2017    Procedure: CLOSED REDUCTION NASAL FRACTURE;  Surgeon: Clyde Canterbury, MD;  Location: [facility];  Service: ENT;  Laterality: N/A;   ORIF HUMERUS FRACTURE Right 05/05/2021    Procedure: reverse Hill-Sachs allografting, biceps tenodesis;  Surgeon: [name], MD;  Location: [facility];  Service: Orthopedics;  Laterality: Right;   SEPTOPLASTY N/A 08/31/2017    Procedure: SEPTOPLASTY;  Surgeon: Clyde Canterbury, MD;  Location: [facility] [place];  Service: ENT;  Laterality: N/A;   SHOULDER ARTHROSCOPY WITH LABRAL REPAIR Left 04/30/2020    Procedure: LEFT SHOULDER POSTERIOR LABRAL REPAIR WITH ARTHROSCOPY, BICEPS TENDON RELEASE AND TENODESIS, OPEN ALLOGRAFT FOR REVERSE BANKART LESION;  Surgeon: [name], MD;  Location: [place];  Service: Orthopedics;  Laterality: Left;   SHOULDER ARTHROSCOPY WITH LABRAL REPAIR Right 05/05/2021    Procedure: right shoulder arthroscopy, biceps release, posterior labral repair;;   Surgeon: [name], MD;  Location: [facility];  Service: Orthopedics;  Laterality: Right;   TONSILLECTOMY        and addenoids        Patient Active Problem List    Diagnosis Date Noted   Hypokalemia 06/26/2021   B12 deficiency 06/26/2021   Rhabdomyolysis 06/26/2021   Acute encephalopathy 06/20/2021   Aspiration pneumonia ([place]) 06/20/2021   Febrile illness     Shoulder instability, right     Humeral head fracture, right, with malunion, subsequent encounter     Esophagitis determined by endoscopy 07/24/2020   Labral tear of shoulder, left, subsequent encounter 02/13/2020   Shoulder dislocation, left, subsequent encounter 02/13/2020   Seizure disorder ([place]) 12/22/2019   Migraines 12/22/2019   Mild intermittent asthma 12/22/2019   Migraine without aura and without status migrainosus, not intractable 06/29/2019   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   Altered mental status 03/12/2014   Lethargic 03/09/2014   Environmental allergies 03/01/2014   Gastroesophageal reflux disease with esophagitis 10/06/2013   Localization-related (focal) (partial) symptomatic epilepsy and epileptic syndromes with complex partial seizures, not intractable, without status epilepticus ([place]) 10/06/2013   Spells 07/26/2013   Allergy to other foods 07/13/2013   Prophylactic immunotherapy 07/13/2013   Unspecified asthma, uncomplicated 00/93/8182  Eosinophilic esophagitis 16/04/9603   Gastroesophageal reflux 01/23/2013   Vomiting 01/23/2013   Leukopenia 10/19/2012   Muscle jerks during sleep 10/19/2012   Constipation 08/20/2012   Neutropenia ([place]) 08/20/2012   Allergic rhinitis due to pollen 02/19/2011      REFERRING DIAG: S/P arthroscopy of right shoulder- 05/05/21: Procedure: reverse Hill-Sachs allografting, biceps tenodesis    THERAPY DIAG:  No diagnosis found.   PERTINENT HISTORY: LEFT SHOULDER POSTERIOR LABRAL REPAIR WITH ARTHROSCOPY   PRECAUTIONS: Shoulder: No crossed arm adduction and no  posterior capsular stretching.  2 times a week for 4 weeks.  Follow-up with me in 8 weeks.  I think is okay for him to do some shooting and dribbling and recreational play beginning at the earliest in the January which would be 3 months out after procedure   SUBJECTIVE: I've been playing basketball and it feels tight in the top of my shoulder when I go to make a shot.   PAIN:  Are you having pain? No NPRS scale: 0/10 Pain location: Shoulder Pain orientation: Right  PAIN TYPE: NA Pain description: NA  Aggravating factors: NA Relieving factors: NA   OBJECTIVE:   *Unless otherwise noted, objective information collected previously* DIAGNOSTIC FINDINGS:  IMPRESSION: 03/07/21 1. Posterior labral tear and fracture of the posterior glenoid. Abnormal concavity involving the anteromedial aspect of the humeral head and bone marrow edema consistent with a reverse Hill Sachs lesion. Overall findings are consistent with sequela posterior shoulder dislocation.   COGNITION:          Overall cognitive status: Within functional limits for tasks assessed                               SENSATION:          Light touch: Appears intact   POSTURE: Bilat winging scapula   PALPATION: NT   UPPER EXTREMITY AROM/PROM:   A/PROM Right 08/02/2021 Left 08/02/2021 Right 08/21/21  Shoulder flexion 120/140 140/160 150/155  Shoulder extension       Shoulder abduction 115/120 125/160 120/180  Shoulder adduction       Shoulder internal rotation L1/40 T8/63 30  Shoulder external rotation T5/65 T7/90 65  Elbow flexion       Elbow extension       Wrist flexion       Wrist extension       Wrist ulnar deviation       Wrist radial deviation       Wrist pronation       Wrist supination       (Blank rows = not tested) Tendency for compensation for R shoulder elevation with trunk ext. Pt experiences R shoulder pain at the endrange of shoulder flexion.   UPPER EXTREMITY MMT:   MMT Right 08/02/2021 Left 08/02/2021  Right 08/21/21  Shoulder flexion 4 4+ 4+  Shoulder extension 5 5 -  Shoulder abduction 4 4+ 5  Shoulder adduction 5 5 -  IR 4 5 5   ER 4 5 5   Elbow flexion 4 5 5   Elbow extension 4 5 5   Wrist flexion       Wrist extension       Wrist ulnar deviation       Wrist radial deviation       Wrist pronation       Wrist supination       Grip strength (lbs)       (Blank rows =  not tested)     [facility] Adult PT Treatment:                                                DATE: 08/21/2021 Therapeutic Exercise: Ball up wall with manual perturbations 3 x 30 sec Shoulder rolls forward/retro 3 x 10 Super set: Low/high rows, lat pull down  25# x10 each, 35# x 10 each Seated pendulums x 20 Push up+ at wall with alternating scap retraction 2 x 10 Standing Y's 1lb x 2  Prone Y's 1 lb 2 x 10 bil   [facility] Adult PT Treatment:                                                DATE: 1/24.23   Therapeutic Exercise:  UBE L1, 5 mins, each direction Shoulder flex stretch on wall x2, 30" Shoulder ER stretch in doorway x2, 30" Shoulder IR stretch c towel x2, 30" Shoulder row 2x15, GTB Shoulder press/serratus press 2x15, GTB Shoulder ER 2x15. GTB Shoulder IR 2x15, GTB   Self Care: Updated HEP, stretching exs daily, strengthening exs every other day              PATIENT EDUCATION: Education details: Eval findings, POC, HEP Person educated: Patient Education method: Explanation, Demonstration, Tactile cues, Verbal cues, and Handouts Education comprehension: verbalized understanding, returned demonstration, verbal cues required, tactile cues required, and needs further education     HOME EXERCISE PROGRAM: Access Code: [facility] URL: [email or web address] Date: 08/19/2021 Prepared by: Gar Ponto   Exercises Standing Single Arm Shoulder Flexion Stretch on Wall (Mirrored) - 2 x daily - 7 x weekly - 1 sets - 3 reps - 30 hold Standing Shoulder External Rotation Stretch in Doorway - 2 x daily - 7  x weekly - 1 sets - 3 reps - 30 hold Standing Shoulder Internal Rotation Stretch with Towel - 2 x daily - 7 x weekly - 1 sets - 3 reps - 30 hold Shoulder External Rotation with Anchored Resistance - 1 x daily - 7 x weekly - 3 sets - 10 reps - 3 hold Shoulder Internal Rotation with Resistance - 1 x daily - 7 x weekly - 3 sets - 10 reps - 3 hold Standing Shoulder Row with Anchored Resistance - 1 x daily - 7 x weekly - 3 sets - 10 reps - 3 hold Standing Serratus Punch with Resistance - 1 x daily - 7 x weekly - 3 sets - 10 reps - 3 hold       ASSESSMENT:   CLINICAL IMPRESSION:   Pt reports no pain in R shoulder, just stiffness, especially when going to shoot a basketball. He demonstrates improvement in AROM flexion and PROM abduction. He also demonstrated improvement in all motions of R shoulder/elbow strength. Integrated more strengthening exercises this session with good participation and motivation. Patient continues to benefit from skilled PT services and should be progressed as able to improve functional independence.    REHAB POTENTIAL: Excellent   CLINICAL DECISION MAKING: Stable/uncomplicated   EVALUATION COMPLEXITY: Low     GOALS:   SHORT TERM GOALS=LTGs     LONG TERM GOALS:    LTG Name Target Date Goal status  1 Pt will  be Ind in a final HEP to maintain achieved level of function Baseline: 10/03/21 Progressing  2 Increase R shoulder AROM: flexion 140d, abd 140d, IR T10, ER T7 for improved R shoulder function Baseline: flex 120, abd 115, IR L1, ER T5 Current 08/21/21: flex 150, abd 120, IR 30, ER 65 10/03/21  Progressing  3 Increase R shoulder strength to 4+/5 or greater for improved R shoulder function Baseline:flex, abd, ER, IR 4/5 Current 08/21/21: flex 4+/5, abd, ER, IR 5/5 10/03/21 Progressing  4 Pt will be able to shoot a basketball s experiencing R shoulder pain  Baseline: 10/03/21 Progressing    PLAN: PT FREQUENCY: 2x/week   PT DURATION: 8 weeks   PLANNED  INTERVENTIONS: Therapeutic exercises, Therapeutic activity, Neuro Muscular re-education, Patient/Family education, Joint mobilization, Dry Needling, Cryotherapy, Moist heat, Taping, Vasopneumatic device, Ultrasound, Ionotophoresis 4mg /ml Dexamethasone, and Manual therapy   PLAN FOR NEXT SESSION: Assess response to HEP, progress therex to strengthening exs and add to HEP, utilize manual therapy and modalities as indicated       Evelene Croon, PTA 08/21/21 2:45 PM                    Note Details  Author Cherie Ouch, PT File Time 08/20/2021  3:21 PM  Author Type Physical Therapist Status Incomplete  Last Editor Cherie Ouch, PT Specialty Physical Therapy

## 2021-08-26 ENCOUNTER — Ambulatory Visit: Payer: Medicaid Other

## 2021-08-26 ENCOUNTER — Other Ambulatory Visit: Payer: Self-pay

## 2021-08-26 DIAGNOSIS — M25611 Stiffness of right shoulder, not elsewhere classified: Secondary | ICD-10-CM

## 2021-08-26 DIAGNOSIS — M6281 Muscle weakness (generalized): Secondary | ICD-10-CM

## 2021-08-26 DIAGNOSIS — R293 Abnormal posture: Secondary | ICD-10-CM

## 2021-08-26 DIAGNOSIS — Z9889 Other specified postprocedural states: Secondary | ICD-10-CM

## 2021-08-26 DIAGNOSIS — M25612 Stiffness of left shoulder, not elsewhere classified: Secondary | ICD-10-CM

## 2021-08-26 NOTE — Therapy (Addendum)
OUTPATIENT PHYSICAL THERAPY TREATMENT NOTE/DIscharge     Patient Name: Terrence Riley MRN: [id number] DOB:[birth year], 21 y.o., male Today's Date: 08/20/2021   PCP: Eulas Post, MD REFERRING PROVIDER: [name], MD             Past Medical History:  Diagnosis Date   Allergy      rhinitis   Asthma      as a child   Complication of anesthesia      pt. reports he need more anesthesia with the septoplasty surgery   Eosinophilic esophagitis      heartburn and reflux   Family history of adverse reaction to anesthesia      mother respiratory failure   Headache     Nasal fracture      deviated septum   Pneumonia      1x history of   Premature baby      2 months early   Seizures ([place])      well controlled on meds/ one 2 months ago when meds were late         Past Surgical History:  Procedure Laterality Date   ADENOIDECTOMY       CLOSED REDUCTION NASAL FRACTURE N/A 08/31/2017    Procedure: CLOSED REDUCTION NASAL FRACTURE;  Surgeon: Clyde Canterbury, MD;  Location: [place];  Service: ENT;  Laterality: N/A;   ORIF HUMERUS FRACTURE Right 05/05/2021    Procedure: reverse Hill-Sachs allografting, biceps tenodesis;  Surgeon: [name], MD;  Location: [place];  Service: Orthopedics;  Laterality: Right;   SEPTOPLASTY N/A 08/31/2017    Procedure: SEPTOPLASTY;  Surgeon: Clyde Canterbury, MD;  Location: [place];  Service: ENT;  Laterality: N/A;   SHOULDER ARTHROSCOPY WITH LABRAL REPAIR Left 04/30/2020    Procedure: LEFT SHOULDER POSTERIOR LABRAL REPAIR WITH ARTHROSCOPY, BICEPS TENDON RELEASE AND TENODESIS, OPEN ALLOGRAFT FOR REVERSE BANKART LESION;  Surgeon: [name], MD;  Location: [place];  Service: Orthopedics;  Laterality: Left;   SHOULDER ARTHROSCOPY WITH LABRAL REPAIR Right  05/05/2021    Procedure: right shoulder arthroscopy, biceps release, posterior labral repair;;  Surgeon: [name], MD;  Location: [place];  Service: Orthopedics;  Laterality: Right;   TONSILLECTOMY        and addenoids        Patient Active Problem List    Diagnosis Date Noted   Hypokalemia 06/26/2021   B12 deficiency 06/26/2021   Rhabdomyolysis 06/26/2021   Acute encephalopathy 06/20/2021   Aspiration pneumonia ([place]) 06/20/2021   Febrile illness     Shoulder instability, right     Humeral  head fracture, right, with malunion, subsequent encounter     Esophagitis determined by endoscopy 07/24/2020   Labral tear of shoulder, left, subsequent encounter 02/13/2020   Shoulder dislocation, left, subsequent encounter 02/13/2020   Seizure disorder ([place]) 12/22/2019   Migraines 12/22/2019   Mild intermittent asthma 12/22/2019   Migraine without aura and without status migrainosus, not intractable 06/29/2019   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   Altered mental status 03/12/2014   Lethargic 03/09/2014   Environmental allergies 03/01/2014   Gastroesophageal reflux disease with esophagitis 10/06/2013   Localization-related (focal) (partial) symptomatic epilepsy and epileptic syndromes with complex partial seizures, not intractable, without status epilepticus ([place]) 10/06/2013   Spells 07/26/2013   Allergy to other foods 07/13/2013   Prophylactic immunotherapy 07/13/2013   Unspecified asthma, uncomplicated 29/08/1113   Eosinophilic esophagitis 52/02/222   Gastroesophageal reflux 01/23/2013   Vomiting 01/23/2013   Leukopenia 10/19/2012   Muscle jerks during sleep 10/19/2012   Constipation 08/20/2012   Neutropenia ([place]) 08/20/2012   Allergic rhinitis due to pollen 02/19/2011      REFERRING DIAG: S/P arthroscopy of right shoulder- 05/05/21: Procedure: reverse Hill-Sachs allografting, biceps tenodesis    THERAPY DIAG:  No diagnosis found.   PERTINENT HISTORY: LEFT SHOULDER POSTERIOR  LABRAL REPAIR WITH ARTHROSCOPY   PRECAUTIONS: Shoulder: No crossed arm adduction and no posterior capsular stretching.  2 times a week for 4 weeks.  Follow-up with me in 8 weeks.  I think is okay for him to do some shooting and dribbling and recreational play beginning at the earliest in the January which would be 3 months out after procedure   SUBJECTIVE: Pt's reports his R shoulder is doing well and has no concerns   PAIN:  Are you having pain? No NPRS scale: 0/10 Pain location: Shoulder Pain orientation: Right  PAIN TYPE: NA Pain description: NA  Aggravating factors: NA Relieving factors: NA   OBJECTIVE:   *Unless otherwise noted, objective information collected previously* DIAGNOSTIC FINDINGS:  IMPRESSION: 03/07/21 1. Posterior labral tear and fracture of the posterior glenoid. Abnormal concavity involving the anteromedial aspect of the humeral head and bone marrow edema consistent with a reverse Hill Sachs lesion. Overall findings are consistent with sequela posterior shoulder dislocation.   COGNITION:          Overall cognitive status: Within functional limits for tasks assessed                               SENSATION:          Light touch: Appears intact   POSTURE: Bilat winging scapula   PALPATION: NT   UPPER EXTREMITY AROM/PROM:   A/PROM Right 08/02/2021 Left 08/02/2021 Right 08/21/21  Shoulder flexion 120/140 140/160 150/155  Shoulder extension       Shoulder abduction 115/120 125/160 120/180  Shoulder adduction       Shoulder internal rotation L1/40 T8/63 30  Shoulder external rotation T5/65 T7/90 65  Elbow flexion       Elbow extension       Wrist flexion       Wrist extension       Wrist ulnar deviation       Wrist radial deviation       Wrist pronation       Wrist supination       (Blank rows = not tested) Tendency for compensation for R shoulder elevation with trunk ext. Pt experiences R shoulder pain at the endrange of  shoulder flexion.   UPPER  EXTREMITY MMT:   MMT Right 08/02/2021 Left 08/02/2021 Right 08/21/21  Shoulder flexion 4 4+ 4+  Shoulder extension 5 5 -  Shoulder abduction 4 4+ 5  Shoulder adduction 5 5 -  IR 4 5 5   ER 4 5 5   Elbow flexion 4 5 5   Elbow extension 4 5 5   Wrist flexion       Wrist extension       Wrist ulnar deviation       Wrist radial deviation       Wrist pronation       Wrist supination       Grip strength (lbs)       (Blank rows = not tested)   [facility] Adult PT Treatment:                                                DATE: 08/26/21  Therapeutic Exercise:  UBE L1, 5 mins, each direction Shoulder ladder x3, 20" Body blade 90d flexion and abd, x2 each, 20" SL, R ER, 3x10, 5# Supine protraction, 3x10, 10" Bicep curl (supination), 3x10, 10" Standing shoulder flexion 3x10, 2" (shoulder hike with 3 #)     [facility] Adult PT Treatment:                                                DATE: 08/21/2021 Therapeutic Exercise: Ball up wall with manual perturbations 3 x 30 sec Shoulder rolls forward/retro 3 x 10 Super set: Low/high rows, lat pull down  25# x10 each, 35# x 10 each Seated pendulums x 20 Push up+ at wall with alternating scap retraction 2 x 10 Standing Y's 1lb x 2  Prone Y's 1 lb 2 x 10 bil   [facility] Adult PT Treatment:                                                DATE: 1/24.23   Therapeutic Exercise:  UBE L1, 5 mins, each direction Shoulder flex stretch on wall x2, 30" Shoulder ER stretch in doorway x2, 30" Shoulder IR stretch c towel x2, 30" Shoulder row 2x15, GTB Shoulder press/serratus press 2x15, GTB Shoulder ER 2x15. GTB Shoulder IR 2x15, GTB   Self Care: Updated HEP, stretching exs daily, strengthening exs every other day              PATIENT EDUCATION: Education details: Eval findings, POC, HEP Person educated: Patient Education method: Explanation, Demonstration, Tactile cues, Verbal cues, and Handouts Education comprehension: verbalized understanding, returned  demonstration, verbal cues required, tactile cues required, and needs further education     HOME EXERCISE PROGRAM: Access Code: [facility] URL: [email or web address] Date: 08/19/2021 Prepared by: Gar Ponto   Exercises Standing Single Arm Shoulder Flexion Stretch on Wall (Mirrored) - 2 x daily - 7 x weekly - 1 sets - 3 reps - 30 hold Standing Shoulder External Rotation Stretch in Doorway - 2 x daily - 7 x weekly - 1 sets - 3 reps - 30 hold Standing Shoulder Internal Rotation Stretch with Towel - 2 x daily -  7 x weekly - 1 sets - 3 reps - 30 hold Shoulder External Rotation with Anchored Resistance - 1 x daily - 7 x weekly - 3 sets - 10 reps - 3 hold Shoulder Internal Rotation with Resistance - 1 x daily - 7 x weekly - 3 sets - 10 reps - 3 hold Standing Shoulder Row with Anchored Resistance - 1 x daily - 7 x weekly - 3 sets - 10 reps - 3 hold Standing Serratus Punch with Resistance - 1 x daily - 7 x weekly - 3 sets - 10 reps - 3 hold       ASSESSMENT:   CLINICAL IMPRESSION:   Pt completed PT for R shoulder strengthening and ROM. Pt continues to tolerated progression with his therex for ROM and strengthening. Pt tolerated today's PT session without adverse effects. Pt will continue to benefit from skilled PT to improve ROM and strength deficits to optimize functional use of the R UE.   REHAB POTENTIAL: Excellent   CLINICAL DECISION MAKING: Stable/uncomplicated   EVALUATION COMPLEXITY: Low     GOALS:   SHORT TERM GOALS=LTGs     LONG TERM GOALS:    LTG Name Target Date Goal status  1 Pt will be Ind in a final HEP to maintain achieved level of function Baseline: 10/03/21 Progressing  2 Increase R shoulder AROM: flexion 140d, abd 140d, IR T10, ER T7 for improved R shoulder function Baseline: flex 120, abd 115, IR L1, ER T5 Current 08/21/21: flex 150, abd 120, IR 30, ER 65 10/03/21  Progressing  3 Increase R shoulder strength to 4+/5 or greater for improved R shoulder  function Baseline:flex, abd, ER, IR 4/5 Current 08/21/21: flex 4+/5, abd, ER, IR 5/5 10/03/21 Progressing  4 Pt will be able to shoot a basketball s experiencing R shoulder pain  Baseline: 10/03/21 Progressing    PLAN: PT FREQUENCY: 2x/week   PT DURATION: 8 weeks   PLANNED INTERVENTIONS: Therapeutic exercises, Therapeutic activity, Neuro Muscular re-education, Patient/Family education, Joint mobilization, Dry Needling, Cryotherapy, Moist heat, Taping, Vasopneumatic device, Ultrasound, Ionotophoresis 29m/ml Dexamethasone, and Manual therapy   PLAN FOR NEXT SESSION: Assess response to HEP, progress therex to strengthening exs and add to HEP, utilize manual therapy and modalities as indicated       [employer]MS, PT 08/26/21 3:34 PM  PHYSICAL THERAPY DISCHARGE SUMMARY  Visits from Start of Care: 4  Current functional level related to goals / functional outcomes: See clinical impression and PT goals    Remaining deficits: See clinical impression and PT goals    Education / Equipment: HEP   Patient agrees to discharge. Patient goals were not met. Patient is being discharged due to not returning since the last visit.  Davena Julian MS, PT 06/23/22 6:29 AM                        Note Details

## 2021-08-27 NOTE — Therapy (Incomplete)
OUTPATIENT PHYSICAL THERAPY TREATMENT NOTE     Patient Name: Terrence Riley MRN: [id number] DOB:01-10-2001, 21 y.o., male Today's Date: 08/20/2021   PCP: Eulas Post, MD REFERRING PROVIDER: [name], MD             Past Medical History:  Diagnosis Date   Allergy      rhinitis   Asthma      as a child   Complication of anesthesia      pt. reports he need more anesthesia with the septoplasty surgery   Eosinophilic esophagitis      heartburn and reflux   Family history of adverse reaction to anesthesia      mother respiratory failure   Headache     Nasal fracture      deviated septum   Pneumonia      1x history of   Premature baby      2 months early   Seizures ([place])      well controlled on meds/ one 2 months ago when meds were late         Past Surgical History:  Procedure Laterality Date   ADENOIDECTOMY       CLOSED REDUCTION NASAL FRACTURE N/A 08/31/2017    Procedure: CLOSED REDUCTION NASAL FRACTURE;  Surgeon: Clyde Canterbury, MD;  Location: [place];  Service: ENT;  Laterality: N/A;   ORIF HUMERUS FRACTURE Right 05/05/2021    Procedure: reverse Hill-Sachs allografting, biceps tenodesis;  Surgeon: [name], MD;  Location: [place];  Service: Orthopedics;  Laterality: Right;   SEPTOPLASTY N/A 08/31/2017    Procedure: SEPTOPLASTY;  Surgeon: Clyde Canterbury, MD;  Location: [place];  Service: ENT;  Laterality: N/A;   SHOULDER ARTHROSCOPY WITH LABRAL REPAIR Left 04/30/2020    Procedure: LEFT SHOULDER POSTERIOR LABRAL REPAIR WITH ARTHROSCOPY, BICEPS TENDON RELEASE AND TENODESIS, OPEN ALLOGRAFT FOR REVERSE BANKART LESION;  Surgeon: [name], MD;  Location: [place];  Service: Orthopedics;  Laterality: Left;   SHOULDER ARTHROSCOPY WITH LABRAL REPAIR Right 05/05/2021     Procedure: right shoulder arthroscopy, biceps release, posterior labral repair;;  Surgeon: [name], MD;  Location: [place];  Service: Orthopedics;  Laterality: Right;   TONSILLECTOMY        and addenoids        Patient Active Problem List    Diagnosis Date Noted   Hypokalemia 06/26/2021   B12 deficiency 06/26/2021   Rhabdomyolysis 06/26/2021   Acute encephalopathy 06/20/2021   Aspiration pneumonia ([place]) 06/20/2021   Febrile illness     Shoulder instability, right     Humeral  head fracture, right, with malunion, subsequent encounter     Esophagitis determined by endoscopy 07/24/2020   Labral tear of shoulder, left, subsequent encounter 02/13/2020   Shoulder dislocation, left, subsequent encounter 02/13/2020   Seizure disorder ([place]) 12/22/2019   Migraines 12/22/2019   Mild intermittent asthma 12/22/2019   Migraine without aura and without status migrainosus, not intractable 06/29/2019   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   Altered mental status 03/12/2014   Lethargic 03/09/2014   Environmental allergies 03/01/2014   Gastroesophageal reflux disease with esophagitis 10/06/2013   Localization-related (focal) (partial) symptomatic epilepsy and epileptic syndromes with complex partial seizures, not intractable, without status epilepticus ([place]) 10/06/2013   Spells 07/26/2013   Allergy to other foods 07/13/2013   Prophylactic immunotherapy 07/13/2013   Unspecified asthma, uncomplicated 09/38/1829   Eosinophilic esophagitis 93/71/6967   Gastroesophageal reflux 01/23/2013   Vomiting 01/23/2013   Leukopenia 10/19/2012   Muscle jerks during sleep 10/19/2012   Constipation 08/20/2012   Neutropenia ([place]) 08/20/2012   Allergic rhinitis due to pollen 02/19/2011      REFERRING DIAG: S/P arthroscopy of right shoulder- 05/05/21: Procedure: reverse Hill-Sachs allografting, biceps tenodesis    THERAPY DIAG:  No diagnosis found.   PERTINENT HISTORY: LEFT SHOULDER POSTERIOR LABRAL REPAIR  WITH ARTHROSCOPY   PRECAUTIONS: Shoulder: No crossed arm adduction and no posterior capsular stretching.  2 times a week for 4 weeks.  Follow-up with me in 8 weeks.  I think is okay for him to do some shooting and dribbling and recreational play beginning at the earliest in the January which would be 3 months out after procedure   SUBJECTIVE: Pt's reports his R shoulder is doing well and has no concerns   PAIN:  Are you having pain? No NPRS scale: 0/10 Pain location: Shoulder Pain orientation: Right  PAIN TYPE: NA Pain description: NA  Aggravating factors: NA Relieving factors: NA   OBJECTIVE:   *Unless otherwise noted, objective information collected previously* DIAGNOSTIC FINDINGS:  IMPRESSION: 03/07/21 1. Posterior labral tear and fracture of the posterior glenoid. Abnormal concavity involving the anteromedial aspect of the humeral head and bone marrow edema consistent with a reverse Hill Sachs lesion. Overall findings are consistent with sequela posterior shoulder dislocation.   COGNITION:          Overall cognitive status: Within functional limits for tasks assessed                               SENSATION:          Light touch: Appears intact   POSTURE: Bilat winging scapula   PALPATION: NT   UPPER EXTREMITY AROM/PROM:   A/PROM Right 08/02/2021 Left 08/02/2021 Right 08/21/21  Shoulder flexion 120/140 140/160 150/155  Shoulder extension       Shoulder abduction 115/120 125/160 120/180  Shoulder adduction       Shoulder internal rotation L1/40 T8/63 30  Shoulder external rotation T5/65 T7/90 65  Elbow flexion       Elbow extension       Wrist flexion       Wrist extension       Wrist ulnar deviation       Wrist radial deviation       Wrist pronation       Wrist supination       (Blank rows = not tested) Tendency for compensation for R shoulder elevation with trunk ext. Pt experiences R shoulder pain at the endrange of  shoulder flexion.   UPPER EXTREMITY  MMT:   MMT Right 08/02/2021 Left 08/02/2021 Right 08/21/21  Shoulder flexion 4 4+ 4+  Shoulder extension 5 5 -  Shoulder abduction 4 4+ 5  Shoulder adduction 5 5 -  IR 4 5 5   ER 4 5 5   Elbow flexion 4 5 5   Elbow extension 4 5 5   Wrist flexion       Wrist extension       Wrist ulnar deviation       Wrist radial deviation       Wrist pronation       Wrist supination       Grip strength (lbs)       (Blank rows = not tested)   [facility] Adult PT Treatment:                                                DATE: *** Therapeutic Exercise: *** Manual Therapy: *** Neuromuscular re-ed: *** Therapeutic Activity: *** Modalities: *** Self Care: ***    [facility] Adult PT Treatment:                                                DATE: 08/26/21  Therapeutic Exercise:  UBE L1, 5 mins, each direction Shoulder ladder x3, 20" Body blade 90d flexion and abd, x2 each, 20" SL, R ER, 3x10, 5# Supine protraction, 3x10, 10" Bicep curl (supination), 3x10, 10" Standing shoulder flexion 3x10, 2" (shoulder hike with 3 #)     [place] Adult PT Treatment:                                                DATE: 08/21/2021 Therapeutic Exercise: Ball up wall with manual perturbations 3 x 30 sec Shoulder rolls forward/retro 3 x 10 Super set: Low/high rows, lat pull down  25# x10 each, 35# x 10 each Seated pendulums x 20 Push up+ at wall with alternating scap retraction 2 x 10 Standing Y's 1lb x 2  Prone Y's 1 lb 2 x 10 bil   [facility] Adult PT Treatment:                                                DATE: 1/24.23   Therapeutic Exercise:  UBE L1, 5 mins, each direction Shoulder flex stretch on wall x2, 30" Shoulder ER stretch in doorway x2, 30" Shoulder IR stretch c towel x2, 30" Shoulder row 2x15, GTB Shoulder press/serratus press 2x15, GTB Shoulder ER 2x15. GTB Shoulder IR 2x15, GTB   Self Care: Updated HEP, stretching exs daily, strengthening exs every other day              PATIENT  EDUCATION: Education details: Eval findings, POC, HEP Person educated: Patient Education method: Explanation, Demonstration, Tactile cues, Verbal cues, and Handouts Education comprehension: verbalized understanding, returned demonstration, verbal cues required, tactile cues required, and needs further education     HOME EXERCISE PROGRAM: Access Code:  [email or web address] URL: [email or web address] Date: 08/19/2021 Prepared by: Gar Ponto   Exercises Standing Single Arm Shoulder Flexion Stretch on Wall (Mirrored) - 2 x daily - 7 x weekly - 1 sets - 3 reps - 30 hold Standing Shoulder External Rotation Stretch in Doorway - 2 x daily - 7 x weekly - 1 sets - 3 reps - 30 hold Standing Shoulder Internal Rotation Stretch with Towel - 2 x daily - 7 x weekly - 1 sets - 3 reps - 30 hold Shoulder External Rotation with Anchored Resistance - 1 x daily - 7 x weekly - 3 sets - 10 reps - 3 hold Shoulder Internal Rotation with Resistance - 1 x daily - 7 x weekly - 3 sets - 10 reps - 3 hold Standing Shoulder Row with Anchored Resistance - 1 x daily - 7 x weekly - 3 sets - 10 reps - 3 hold Standing Serratus Punch with Resistance - 1 x daily - 7 x weekly - 3 sets - 10 reps - 3 hold       ASSESSMENT:   CLINICAL IMPRESSION:   Pt completed PT for R shoulder strengthening and ROM. Pt continues to tolerated progression with his therex for ROM and strengthening. Pt tolerated today's PT session without adverse effects. Pt will continue to benefit from skilled PT to improve ROM and strength deficits to optimize functional use of the R UE.   REHAB POTENTIAL: Excellent   CLINICAL DECISION MAKING: Stable/uncomplicated   EVALUATION COMPLEXITY: Low     GOALS:   SHORT TERM GOALS=LTGs     LONG TERM GOALS:    LTG Name Target Date Goal status  1 Pt will be Ind in a final HEP to maintain achieved level of function Baseline: 10/03/21 Progressing  2 Increase R shoulder AROM: flexion 140d, abd 140d, IR T10,  ER T7 for improved R shoulder function Baseline: flex 120, abd 115, IR L1, ER T5 Current 08/21/21: flex 150, abd 120, IR 30, ER 65 10/03/21  Progressing  3 Increase R shoulder strength to 4+/5 or greater for improved R shoulder function Baseline:flex, abd, ER, IR 4/5 Current 08/21/21: flex 4+/5, abd, ER, IR 5/5 10/03/21 Progressing  4 Pt will be able to shoot a basketball s experiencing R shoulder pain  Baseline: 10/03/21 Progressing    PLAN: PT FREQUENCY: 2x/week   PT DURATION: 8 weeks   PLANNED INTERVENTIONS: Therapeutic exercises, Therapeutic activity, Neuro Muscular re-education, Patient/Family education, Joint mobilization, Dry Needling, Cryotherapy, Moist heat, Taping, Vasopneumatic device, Ultrasound, Ionotophoresis 4mg /ml Dexamethasone, and Manual therapy   PLAN FOR NEXT SESSION: Assess response to HEP, progress therex to strengthening exs and add to HEP, utilize manual therapy and modalities as indicated       [employer] MS, PT 08/27/21 9:12 PM                       Note Details

## 2021-08-28 ENCOUNTER — Ambulatory Visit: Payer: Medicaid Other

## 2021-09-18 ENCOUNTER — Encounter: Payer: Self-pay | Admitting: Neurology

## 2021-09-18 ENCOUNTER — Ambulatory Visit (INDEPENDENT_AMBULATORY_CARE_PROVIDER_SITE_OTHER): Payer: Medicaid Other | Admitting: Neurology

## 2021-09-18 ENCOUNTER — Other Ambulatory Visit: Payer: Self-pay

## 2021-09-18 VITALS — BP 123/80 | HR 72 | Ht 72.0 in | Wt 129.0 lb

## 2021-09-18 DIAGNOSIS — R440 Auditory hallucinations: Secondary | ICD-10-CM

## 2021-09-18 DIAGNOSIS — G40209 Localization-related (focal) (partial) symptomatic epilepsy and epileptic syndromes with complex partial seizures, not intractable, without status epilepticus: Secondary | ICD-10-CM | POA: Diagnosis not present

## 2021-09-18 DIAGNOSIS — G4700 Insomnia, unspecified: Secondary | ICD-10-CM

## 2021-09-18 DIAGNOSIS — F5104 Psychophysiologic insomnia: Secondary | ICD-10-CM

## 2021-09-18 DIAGNOSIS — F445 Conversion disorder with seizures or convulsions: Secondary | ICD-10-CM | POA: Diagnosis not present

## 2021-09-18 MED ORDER — TOPIRAMATE 200 MG PO TABS: 200.0000 mg | ORAL_TABLET | Freq: Two times a day (BID) | ORAL | 11 refills | Status: DC

## 2021-09-18 MED ORDER — CLONAZEPAM 0.5 MG PO TABS: 0.5000 mg | ORAL_TABLET | Freq: Two times a day (BID) | ORAL | 5 refills | Status: DC

## 2021-09-18 MED ORDER — KEPPRA 1000 MG PO TABS: 2000.0000 mg | ORAL_TABLET | Freq: Two times a day (BID) | ORAL | 11 refills | Status: DC

## 2021-09-18 NOTE — Patient Instructions (Signed)
Good to see you doing better.  Check Keppra and Topamax blood levels  2. Continue all your medications  3. Referral will be sent to Psychiatry to help with sleep  4. Follow-up in 6 months, call for any changes   Seizure Precautions: 1. If medication has been prescribed for you to prevent seizures, take it exactly as directed.  Do not stop taking the medicine without talking to your doctor first, even if you have not had a seizure in a long time.   2. Avoid activities in which a seizure would cause danger to yourself or to others.  Don't operate dangerous machinery, swim alone, or climb in high or dangerous places, such as on ladders, roofs, or girders.  Do not drive unless your doctor says you may.  3. If you have any warning that you may have a seizure, lay down in a safe place where you can't hurt yourself.    4.  No driving for 6 months from last seizure, as per [facility].   Please refer to the following link on the [place] website for more information: http://www.epilepsyfoundation.org/answerplace/Social/driving/drivingu.cfm   5.  Maintain good sleep hygiene. Avoid alcohol.  6.  Contact your doctor if you have any problems that may be related to the medicine you are taking.  7.  Call 911 and bring the patient back to the ED if:        A.  The seizure lasts longer than 5 minutes.       B.  The patient doesn't awaken shortly after the seizure  C.  The patient has new problems such as difficulty seeing, speaking or moving  D.  The patient was injured during the seizure  E.  The patient has a temperature over 102 F (39C)  F.  The patient vomited and now is having trouble breathing

## 2021-09-18 NOTE — Progress Notes (Signed)
NEUROLOGY FOLLOW UP OFFICE NOTE  OTHMAR Riley [id number] 16-Jul-2001  HISTORY OF PRESENT ILLNESS: I had the pleasure of seeing Terrence Riley in follow-up in the neurology clinic on 09/18/2021.  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 He is alone in the office today. Records and images were personally reviewed where available.  He has been to the ER several times since his last visit. He was admitted from 06/20/21 to 06/27/21 for altered mental status. His mother found his confused, naked in the kitchen, he had urinated on the floor. His mother reported it looked like he may have taken too much of his medication. UDS negative, he was febrile with leukocytosis. Lumbar puncture was normal. CK was 984, B12 was 115, he is on B12 injections. Since UDS was negative, concern about medication compliance was raised, since he is on clonazepam 22m BID. Keppra and Topamax were undetectable. Seizure medications restarted, he denies any further seizures or seizure-like symptoms since then. He lives with his mother who has not mentioned any staring/unresponsive episodes. He denies any gaps in time, olfactory/gustatory hallucinations, focal numbness/tingling/weakness, myoclonic jerks. Sometimes he feels a tingling through his body, like he is cold, no associated confusion. He denies any headaches, dizziness, vision changes, no falls. He was in the ER on 09/07/21 for auditory hallucinations, that he reports has been present since 2014. He has not seen Psychiatry recently. He reports mood is fine. He feels tired, sleeping a lot in the day, however awake at night. He manages his own medications and denies missing doses, his mother reminds him just in case. He is on brand Keppra 20045mBID, Topiramate 20038mID, and clonazepam 0.5mg22mD.   History on Initial Assessment 04/16/2021: This is a 21 y28r old right-handed man with a history of co-existing epilepsy  and psychogenic non-epileptic events, depression, presenting to establish care. He is a poor historian, records from his neurologist at [facility] ER visits were reviewed. He states seizures started when he was a baby, notes indicate seizure started in infancy. He does not recall when his last seizure was, he does not remember being in the ER 3 months ago for seizure. This year, he was in the ER in 08/2020 for a nocturnal seizure where he sustained a left eyelid laceration. He admitted to missing 3 doses of Keppra. He was in the ER on 01/14/21 for dizziness/chest burning, when he had a witnessed episode of eye twitching, unresponsive with barely perceptible bilateral upper extremity twitching that lasted at least 20 minutes, given 3mg 31mAtivan and IV Keppra. He was back in the ER on 01/16/21 when he had a [place] the mall lasting 2 minutes, post-ictal on EMS arrival. He states he gets lightheaded and cannot hear, then wakes up in the ambulance. He feels weird and sore, he denies any tongue bite or incontinence. He denies any staring episodes, however [facility] where [facility] is staring, unresponsive with right face, arm, leg jerking. He also has a history of seizures with face contortion, stiffening, head jerks to the left. One time he spun in a complete circle then crumpled to the floor with jerking, head turned to the left. Brain MRI in 2011 was normal. He had a 24-hour EEG in 11/2012 where push button episodes for reduced responses showed normal EEG. Baseline EEG was abnormal due to frequent left frontal/temporal delta slowing and occasional independent right temporal delta slowing, no  epileptiform discharges. He had an EMU admission for 3 days (07/26/13 to 07/29/13) where episode of staring spell felt likely non-epileptic. Baseline EEG was abnormal with "increased epileptogenic potential in the right central/parietal area, increased epileptogenic potential with a generalized  mechanism of onset." He is listed as taking Topiramate 22m BID but only takes 2049mqhs, Levetiracetam 100026mID (taking 2000m63ms), and clonazepam 1mg 22m. Per notes, clonazepam 2 pills daily was started by his pediatric neurologist "meant to be a bridge according to notes dating back to 2014." He denies missing medications, and denies any side effects. He has not noticed any triggers to his seizures. He denies any olfactory/gustatory hallucinations, deja vu, rising epigastric sensation, focal numbness/tingling/weakness, myoclonic jerks. He has neck pain and right shoulder pain with shoulder surgery scheduled next month. He denies any headaches, dizziness, vision changes, bowel/bladder dysfunction, no falls. He lives with his mother who has not mentioned staring spells recently. He usually gets 4-5 hours of sleep and takes naps. 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 Mood is fine.   Epilepsy Risk Factors:  He was born premature. His mother is treated with seizure medication but unclear if she has epilepsy. He had a normal birth and early development.  There is no history of febrile convulsions, CNS infections such as meningitis/encephalitis, significant traumatic brain injury, neurosurgical procedures.  Prior ASMs: Keppra, Trileptal, Tegretol, Carbatrol; incomplete control on monotherapy. More seizures on generic Levetiracetam   PAST MEDICAL HISTORY: Past Medical History:  Diagnosis Date   Allergy    rhinitis   Asthma    as a child   Complication of anesthesia    pt. reports he need more anesthesia with the septoplasty surgery   Eosinophilic esophagitis    heartburn and reflux   Family history of adverse reaction to anesthesia    mother respiratory failure   Headache    Nasal fracture    deviated septum   Pneumonia    1x history of   Premature baby    2 months early   [place]/ one 2 [place] when meds were late     MEDICATIONS: Current Outpatient Medications on File Prior to Visit  Medication Sig Dispense Refill   celecoxib (CELEBREX) 100 MG capsule TAKE 1 CAPSULE BY MOUTH TWICE A DAY 60 capsule 0   cetirizine (ZYRTEC) 5 MG tablet Take 5 mg by mouth at bedtime.     clonazePAM (KLONOPIN) 0.5 MG tablet Take 1 tablet (0.5 mg total) by mouth 2 (two) times daily. 60 tablet 0   cyanocobalamin (,VITAMIN B-12,) 1000 MCG/ML injection Inject 1 mL (1,000 mcg total) into the muscle every 30 (thirty) days. 1 mL 0   EPINEPHrine 0.3 mg/0.3 mL IJ SOAJ injection Inject 0.3 mg into the muscle as needed for anaphylaxis. 1 each 0   FLUoxetine (PROZAC) 20 MG capsule Take 1 capsule (20 mg total) by mouth daily. 30 capsule 3   KEPPRA 1000 MG tablet Take 2 tablets (2,000 mg total) by mouth 2 (two) times daily. 120 tablet 0   lidocaine (LIDODERM) 5 % Place 1 patch onto the skin daily. Remove & Discard patch within 12 hours or as directed by MD (Patient taking differently: Place 1 patch onto the skin daily as needed (pain). Remove & Discard patch within 12 hours or as directed by MD) 30 patch 0   promethazine (PHENERGAN) 25 MG tablet Take 1 tablet (25 mg total) by  mouth every 8 (eight) hours as needed for nausea or vomiting. 10 tablet 0   topiramate (TOPAMAX) 200 MG tablet Take 1 tablet (200 mg total) by mouth 2 (two) times daily. 60 tablet 0   No current facility-administered medications on file prior to visit.    ALLERGIES: Allergies  Allergen Reactions   Lortab [Hydrocodone-Acetaminophen] Hives   Other Anaphylaxis    Peanuts   Zofran [Ondansetron Hcl] Nausea And Vomiting   Citrus Other (See Comments)    Sores in mouth   Dairy Aid [Tilactase] Nausea And Vomiting   Influenza Virus Vaccine Other (See Comments)    Partial paralysis for a couple of weeks per mom   Soy Allergy Other (See Comments)    Seizures    Hydrocodone-Acetaminophen Rash    FAMILY HISTORY: Family History  Problem Relation Age of Onset    Cancer Mother        breat   Protein S deficiency Mother     SOCIAL HISTORY: Social History   Socioeconomic History   Marital status: Significant Other    Spouse name: Not on file   Number of children: Not on file   Years of education: Not on file   Highest education level: Not on file  Occupational History   Not on file  Tobacco Use   Smoking status: Never   Smokeless tobacco: Never  Vaping Use   Vaping Use: Never used  Substance and Sexual Activity   Alcohol use: Yes   Drug use: No   Sexual activity: Never  Other Topics Concern   Not on file  Social History Narrative   Right handed   Social Determinants of Health   Financial Resource Strain: Not on file  Food Insecurity: Not on file  Transportation Needs: Not on file  Physical Activity: Not on file  Stress: Not on file  Social Connections: Not on file  Intimate Partner Violence: Not on file     PHYSICAL EXAM: Vitals:   09/18/21 1051  BP: 123/80  Pulse: 72  SpO2: 98%   General: No acute distress Head:  Normocephalic/atraumatic Skin/Extremities: No rash, no edema Neurological Exam: alert and awake. No aphasia or dysarthria. Fund of knowledge is appropriate.  Attention and concentration are normal.   Cranial nerves: Pupils equal, round. Extraocular movements intact with no nystagmus. Visual fields full.  No facial asymmetry.  Motor: Bulk and tone normal, muscle strength 5/5 throughout with no pronator drift.   Finger to nose testing intact.  Gait narrow-based and steady, able to tandem walk adequately.  Romberg negative.   IMPRESSION: This is a 21 yo RH man with a history of depression, with co-existing epilepsy and psychogenic non-epileptic events. Prior EEG reported bilateral temporal slowing, epileptogenic potential in the right central/parietal area, and generalized mechanism. EEGs in the past showed normal EEG during episodes of staring/decreased responsiveness. MRI brain normal. He was admitted for altered  mental status in November 2022, possibly prolonged post-ictal state, ASM levels were undetectable. He denies any seizures or seizure-like symptoms since then, continue Keppra 2050m BID, Topiramate 2048mBID, and clonazepam 0.76m47mID. He reports compliance, check Keppra and Topamax levels. We discussed poor sleep and auditory hallucinations, he would benefit from evaluation and treatment with Psychiatry. He is aware of [place] driving laws to stop driving after an episode of loss of awareness until 6 months seizure-free. Follow-up in 6 months, call for any changes.    Thank you for allowing me to participate in his care.  Please do  not hesitate to call for any questions or concerns.    Ellouise Newer, M.D.   CC: Dr. Elease Hashimoto

## 2021-10-13 ENCOUNTER — Ambulatory Visit: Payer: Medicaid Other | Admitting: Family Medicine

## 2021-10-17 ENCOUNTER — Ambulatory Visit (INDEPENDENT_AMBULATORY_CARE_PROVIDER_SITE_OTHER): Payer: BC Managed Care – PPO | Admitting: Family Medicine

## 2021-10-17 ENCOUNTER — Encounter: Payer: Self-pay | Admitting: Family Medicine

## 2021-10-17 VITALS — BP 108/80 | HR 75 | Temp 97.6°F | Ht 72.0 in | Wt 125.9 lb

## 2021-10-17 DIAGNOSIS — E538 Deficiency of other specified B group vitamins: Secondary | ICD-10-CM

## 2021-10-17 DIAGNOSIS — G4700 Insomnia, unspecified: Secondary | ICD-10-CM

## 2021-10-17 DIAGNOSIS — G40909 Epilepsy, unspecified, not intractable, without status epilepticus: Secondary | ICD-10-CM | POA: Diagnosis not present

## 2021-10-17 NOTE — Patient Instructions (Signed)
Consider trial of over the counter Melatonin 5 to 10 mg ? ?Could also try OTC Tylenol PM    ? ?Start over the counter B12 1,000 mcg daily.  ?

## 2021-10-17 NOTE — Progress Notes (Signed)
? ?Established Patient Office Visit ? ?Subjective:  ?Patient ID: Terrence Riley, male    DOB: [birth year]  Age: 21 y.o. MRN: [id number] ? ?CC:  ?Chief Complaint  ?Patient presents with  ? Follow-up  ? ? ?HPI ?Terrence Riley presents for medical follow-up.  He has history of migraine headaches, asthma, eosinophilic esophagitis, seizure disorder, B12 deficiency with unremarkable upper GI with small bowel follow-through back in the wintertime.  His neurologic history is somewhat complex.  Coexisting epilepsy and psychogenic nonepileptic events.  He states he has been compliant with his seizure medications recently with no recent known seizures. ?He did have MRI brain which was normal following one of his altered mental status events.  This was back in November. ? ?He states he has difficulty falling asleep and sometimes can go most the night without sleep.  Does not feel agitated.  No high energy.  No impulsivity.  No history of bipolar.  No alcohol use.  Denies illicit drug use.  Recent drug screens of been negative. ? ?Past Medical History:  ?Diagnosis Date  ? Allergy   ? rhinitis  ? Asthma   ? as a child  ? Complication of anesthesia   ? pt. reports he need more anesthesia with the septoplasty surgery  ? Eosinophilic esophagitis   ? heartburn and reflux  ? Family history of adverse reaction to anesthesia   ? mother respiratory failure  ? Headache   ? Nasal fracture   ? deviated septum  ? Pneumonia   ? 1x history of  ? Premature baby   ? 2 months early  ? Seizures ([place])   ? well controlled on meds/ one 2 months ago when meds were late  ? ? ?Past Surgical History:  ?Procedure Laterality Date  ? ADENOIDECTOMY    ? CLOSED REDUCTION NASAL FRACTURE N/A 08/31/2017  ? Procedure: CLOSED REDUCTION NASAL FRACTURE;  Surgeon: Clyde Canterbury, MD;  Location: [place];  Service: ENT;  Laterality: N/A;  ? ORIF HUMERUS FRACTURE Right 05/05/2021  ? Procedure: reverse Hill-Sachs allografting, biceps tenodesis;  Surgeon: [name], MD;  Location: [place];  Service: Orthopedics;  Laterality: Right;  ? SEPTOPLASTY N/A 08/31/2017  ? Procedure: SEPTOPLASTY;  Surgeon: Clyde Canterbury, MD;  Location: [place];  Service: ENT;  Laterality: N/A;  ? SHOULDER ARTHROSCOPY WITH LABRAL REPAIR Left 04/30/2020  ? Procedure: LEFT SHOULDER POSTERIOR LABRAL REPAIR WITH ARTHROSCOPY, BICEPS TENDON RELEASE AND TENODESIS, OPEN ALLOGRAFT FOR REVERSE BANKART LESION;  Surgeon: [name], MD;  Location: [place];  Service: Orthopedics;  Laterality: Left;  ? SHOULDER ARTHROSCOPY WITH LABRAL REPAIR Right 05/05/2021  ? Procedure: right shoulder arthroscopy, biceps release, posterior labral repair;;  Surgeon: [name], MD;  Location: [place];  Service: Orthopedics;  Laterality: Right;  ? TONSILLECTOMY    ? and addenoids  ? ? ?Family History  ?Problem Relation Age of Onset  ? Cancer Mother   ?     breat  ? Protein S deficiency Mother   ? ? ?Social History  ? ?Socioeconomic History  ? Marital status: Single  ?  Spouse name: Not on file  ? Number of children: Not on file  ? Years of education: Not on file  ? Highest education level: Not on file  ?Occupational History  ? Not on file  ?Tobacco Use  ? Smoking status: Never  ? Smokeless tobacco: Never  ?Vaping Use  ? Vaping Use: Never used  ?Substance and Sexual Activity  ?  Alcohol use: Yes  ? Drug use: No  ? Sexual activity: Never  ?Other Topics Concern  ? Not on file  ?Social History Narrative  ? Right handed  ? ?Social Determinants of Health  ? ?Financial Resource Strain: Not on file  ?Food Insecurity: Not on file  ?Transportation Needs: Not on file  ?Physical Activity: Not on file  ?Stress: Not on file  ?Social Connections: Not on file  ?Intimate Partner Violence: Not on file  ? ? ?Outpatient Medications Prior to Visit  ?Medication Sig Dispense Refill  ? celecoxib (CELEBREX) 100 MG capsule TAKE 1 CAPSULE BY MOUTH TWICE A DAY 60 capsule 0  ? cetirizine (ZYRTEC) 5 MG tablet Take 5 mg by mouth  at bedtime.    ? clonazePAM (KLONOPIN) 0.5 MG tablet Take 1 tablet (0.5 mg total) by mouth 2 (two) times daily. 60 tablet 5  ? cyanocobalamin (,VITAMIN B-12,) 1000 MCG/ML injection Inject 1 mL (1,000 mcg total) into the muscle every 30 (thirty) days. 1 mL 0  ? EPINEPHrine 0.3 mg/0.3 mL IJ SOAJ injection Inject 0.3 mg into the muscle as needed for anaphylaxis. 1 each 0  ? FLUoxetine (PROZAC) 20 MG capsule Take 1 capsule (20 mg total) by mouth daily. 30 capsule 3  ? KEPPRA 1000 MG tablet Take 2 tablets (2,000 mg total) by mouth 2 (two) times daily. 120 tablet 11  ? lidocaine (LIDODERM) 5 % Place 1 patch onto the skin daily. Remove & Discard patch within 12 hours or as directed by MD (Patient taking differently: Place 1 patch onto the skin daily as needed (pain). Remove & Discard patch within 12 hours or as directed by MD) 30 patch 0  ? promethazine (PHENERGAN) 25 MG tablet Take 1 tablet (25 mg total) by mouth every 8 (eight) hours as needed for nausea or vomiting. 10 tablet 0  ? topiramate (TOPAMAX) 200 MG tablet Take 1 tablet (200 mg total) by mouth 2 (two) times daily. 60 tablet 11  ? ?No facility-administered medications prior to visit.  ? ? ?Allergies  ?Allergen Reactions  ? Lortab [Hydrocodone-Acetaminophen] Hives  ? Other Anaphylaxis  ?  Peanuts  ? Zofran [Ondansetron Hcl] Nausea And Vomiting  ? Citrus Other (See Comments)  ?  Sores in mouth  ? Dairy Aid [Tilactase] Nausea And Vomiting  ? Influenza Virus Vaccine Other (See Comments)  ?  Partial paralysis for a couple of weeks per mom  ? Soy Allergy Other (See Comments)  ?  Seizures ?  ? Hydrocodone-Acetaminophen Rash  ? ? ?ROS ?Review of Systems  ?Constitutional:  Negative for chills and fever.  ?Respiratory:  Negative for shortness of breath.   ?Psychiatric/Behavioral:  Positive for sleep disturbance. Negative for confusion and dysphoric mood.   ? ?  ?Objective:  ?  ?Physical Exam ?Vitals reviewed.  ?Constitutional:   ?   Appearance: Normal appearance.   ?Cardiovascular:  ?   Rate and Rhythm: Normal rate and regular rhythm.  ?Pulmonary:  ?   Effort: Pulmonary effort is normal.  ?   Breath sounds: Normal breath sounds. No wheezing or rales.  ?Musculoskeletal:  ?   Right lower leg: No edema.  ?   Left lower leg: No edema.  ?Neurological:  ?   General: No focal deficit present.  ?   Mental Status: He is alert.  ? ? ?BP 108/80 (BP Location: Left Arm, Patient Position: Sitting, Cuff Size: Normal)   Pulse 75   Temp 97.6 ?F (36.4 ?C) (Oral)   Ht  6' (1.829 m)   Wt 125 lb 14.4 oz (57.1 kg)   SpO2 99%   BMI 17.08 kg/m?  ?Wt Readings from Last 3 Encounters:  ?10/17/21 125 lb 14.4 oz (57.1 kg)  ?09/18/21 129 lb (58.5 kg)  ?08/05/21 121 lb 4.1 oz (55 kg)  ? ? ? ?Health Maintenance Due  ?Topic Date Due  ? COVID-19 Vaccine (2 - Booster for Janssen series) 02/25/2020  ? INFLUENZA VACCINE  02/24/2021  ? ? ?There are no preventive care reminders to display for this patient. ? ?Lab Results  ?Component Value Date  ? TSH 1.729 06/22/2021  ? ?Lab Results  ?Component Value Date  ? WBC 3.7 (L) 08/06/2021  ? HGB 15.3 08/06/2021  ? HCT 45.5 08/06/2021  ? MCV 87.8 08/06/2021  ? PLT 196 08/06/2021  ? ?Lab Results  ?Component Value Date  ? NA 136 08/06/2021  ? K 3.7 08/06/2021  ? CO2 19 (L) 08/06/2021  ? GLUCOSE 80 08/06/2021  ? BUN 9 08/06/2021  ? CREATININE 1.17 08/06/2021  ? BILITOT 0.8 08/06/2021  ? ALKPHOS 88 08/06/2021  ? AST 31 08/06/2021  ? ALT 29 08/06/2021  ? PROT 7.1 08/06/2021  ? ALBUMIN 4.4 08/06/2021  ? CALCIUM 8.9 08/06/2021  ? ANIONGAP 9 08/06/2021  ? ?Lab Results  ?Component Value Date  ? CHOL 125 03/12/2020  ? ?Lab Results  ?Component Value Date  ? HDL 49 03/12/2020  ? ?Lab Results  ?Component Value Date  ? [place] 63 03/12/2020  ? ?Lab Results  ?Component Value Date  ? TRIG 51 03/12/2020  ? ?Lab Results  ?Component Value Date  ? CHOLHDL 2.6 03/12/2020  ? ?No results found for: HGBA1C ? ?  ?Assessment & Plan:  ? ?#1 history of epilepsy.  Patient on antiseizure  medications with Topamax and Keppra as well as clonazepam.  No recent reported seizure activity. ? ?#2 history of B12 deficiency.  Currently not on any B12.  Recheck B12 level.  No evidence for Crohn's disease from recent i

## 2021-10-18 LAB — VITAMIN B12: Vitamin B-12: 306 pg/mL (ref 200–1100)

## 2021-10-26 ENCOUNTER — Encounter (HOSPITAL_COMMUNITY): Payer: Self-pay

## 2021-10-26 ENCOUNTER — Other Ambulatory Visit: Payer: Self-pay

## 2021-10-26 ENCOUNTER — Emergency Department (HOSPITAL_COMMUNITY): Payer: BC Managed Care – PPO

## 2021-10-26 ENCOUNTER — Emergency Department (HOSPITAL_COMMUNITY)
Admission: EM | Admit: 2021-10-26 | Discharge: 2021-10-26 | Disposition: A | Discharge status: Home / Self Care | Payer: BC Managed Care – PPO | Attending: Emergency Medicine | Admitting: Emergency Medicine

## 2021-10-26 DIAGNOSIS — G4700 Insomnia, unspecified: Secondary | ICD-10-CM | POA: Insufficient documentation

## 2021-10-26 DIAGNOSIS — G479 Sleep disorder, unspecified: Secondary | ICD-10-CM

## 2021-10-26 DIAGNOSIS — E876 Hypokalemia: Secondary | ICD-10-CM | POA: Diagnosis not present

## 2021-10-26 DIAGNOSIS — R42 Dizziness and giddiness: Secondary | ICD-10-CM | POA: Diagnosis not present

## 2021-10-26 DIAGNOSIS — R0600 Dyspnea, unspecified: Secondary | ICD-10-CM | POA: Diagnosis not present

## 2021-10-26 DIAGNOSIS — R0602 Shortness of breath: Secondary | ICD-10-CM | POA: Diagnosis not present

## 2021-10-26 DIAGNOSIS — E860 Dehydration: Secondary | ICD-10-CM | POA: Insufficient documentation

## 2021-10-26 LAB — BASIC METABOLIC PANEL
Anion gap: 7 (ref 5–15)
BUN: 10 mg/dL (ref 6–20)
CO2: 21 mmol/L — ABNORMAL LOW (ref 22–32)
Calcium: 9 mg/dL (ref 8.9–10.3)
Chloride: 109 mmol/L (ref 98–111)
Creatinine, Ser: 1.14 mg/dL (ref 0.61–1.24)
GFR, Estimated: >60 mL/min (ref >60)
Glucose, Bld: 90 mg/dL (ref 70–99)
Potassium: 3.3 mmol/L — ABNORMAL LOW (ref 3.5–5.1)
Sodium: 137 mmol/L (ref 135–145)

## 2021-10-26 LAB — CBC WITH DIFFERENTIAL/PLATELET
Abs Immature Granulocytes: 0.02 10*3/uL (ref 0.00–0.07)
Basophils Absolute: 0.1 10*3/uL (ref 0.0–0.1)
Basophils Relative: 1 %
Eosinophils Absolute: 0.2 10*3/uL (ref 0.0–0.5)
Eosinophils Relative: 5 %
HCT: 39.1 % (ref 39.0–52.0)
Hemoglobin: 13.4 g/dL (ref 13.0–17.0)
Immature Granulocytes: 0 %
Lymphocytes Relative: 39 %
Lymphs Abs: 2 10*3/uL (ref 0.7–4.0)
MCH: 29.2 pg (ref 26.0–34.0)
MCHC: 34.3 g/dL (ref 30.0–36.0)
MCV: 85.2 fL (ref 80.0–100.0)
Monocytes Absolute: 0.4 10*3/uL (ref 0.1–1.0)
Monocytes Relative: 8 %
Neutro Abs: 2.4 10*3/uL (ref 1.7–7.7)
Neutrophils Relative %: 47 %
Platelets: 203 10*3/uL (ref 150–400)
RBC: 4.59 MIL/uL (ref 4.22–5.81)
RDW: 12.5 % (ref 11.5–15.5)
WBC: 5 10*3/uL (ref 4.0–10.5)
nRBC: 0 % (ref 0.0–0.2)

## 2021-10-26 IMAGING — DX DG CHEST 1V PORT
1 series · 1 of 1 positions shown · non-contrast
Comparison: [DATE]

CLINICAL DATA: Dyspnea

EXAM:
PORTABLE CHEST 1 VIEW

[chest ap]
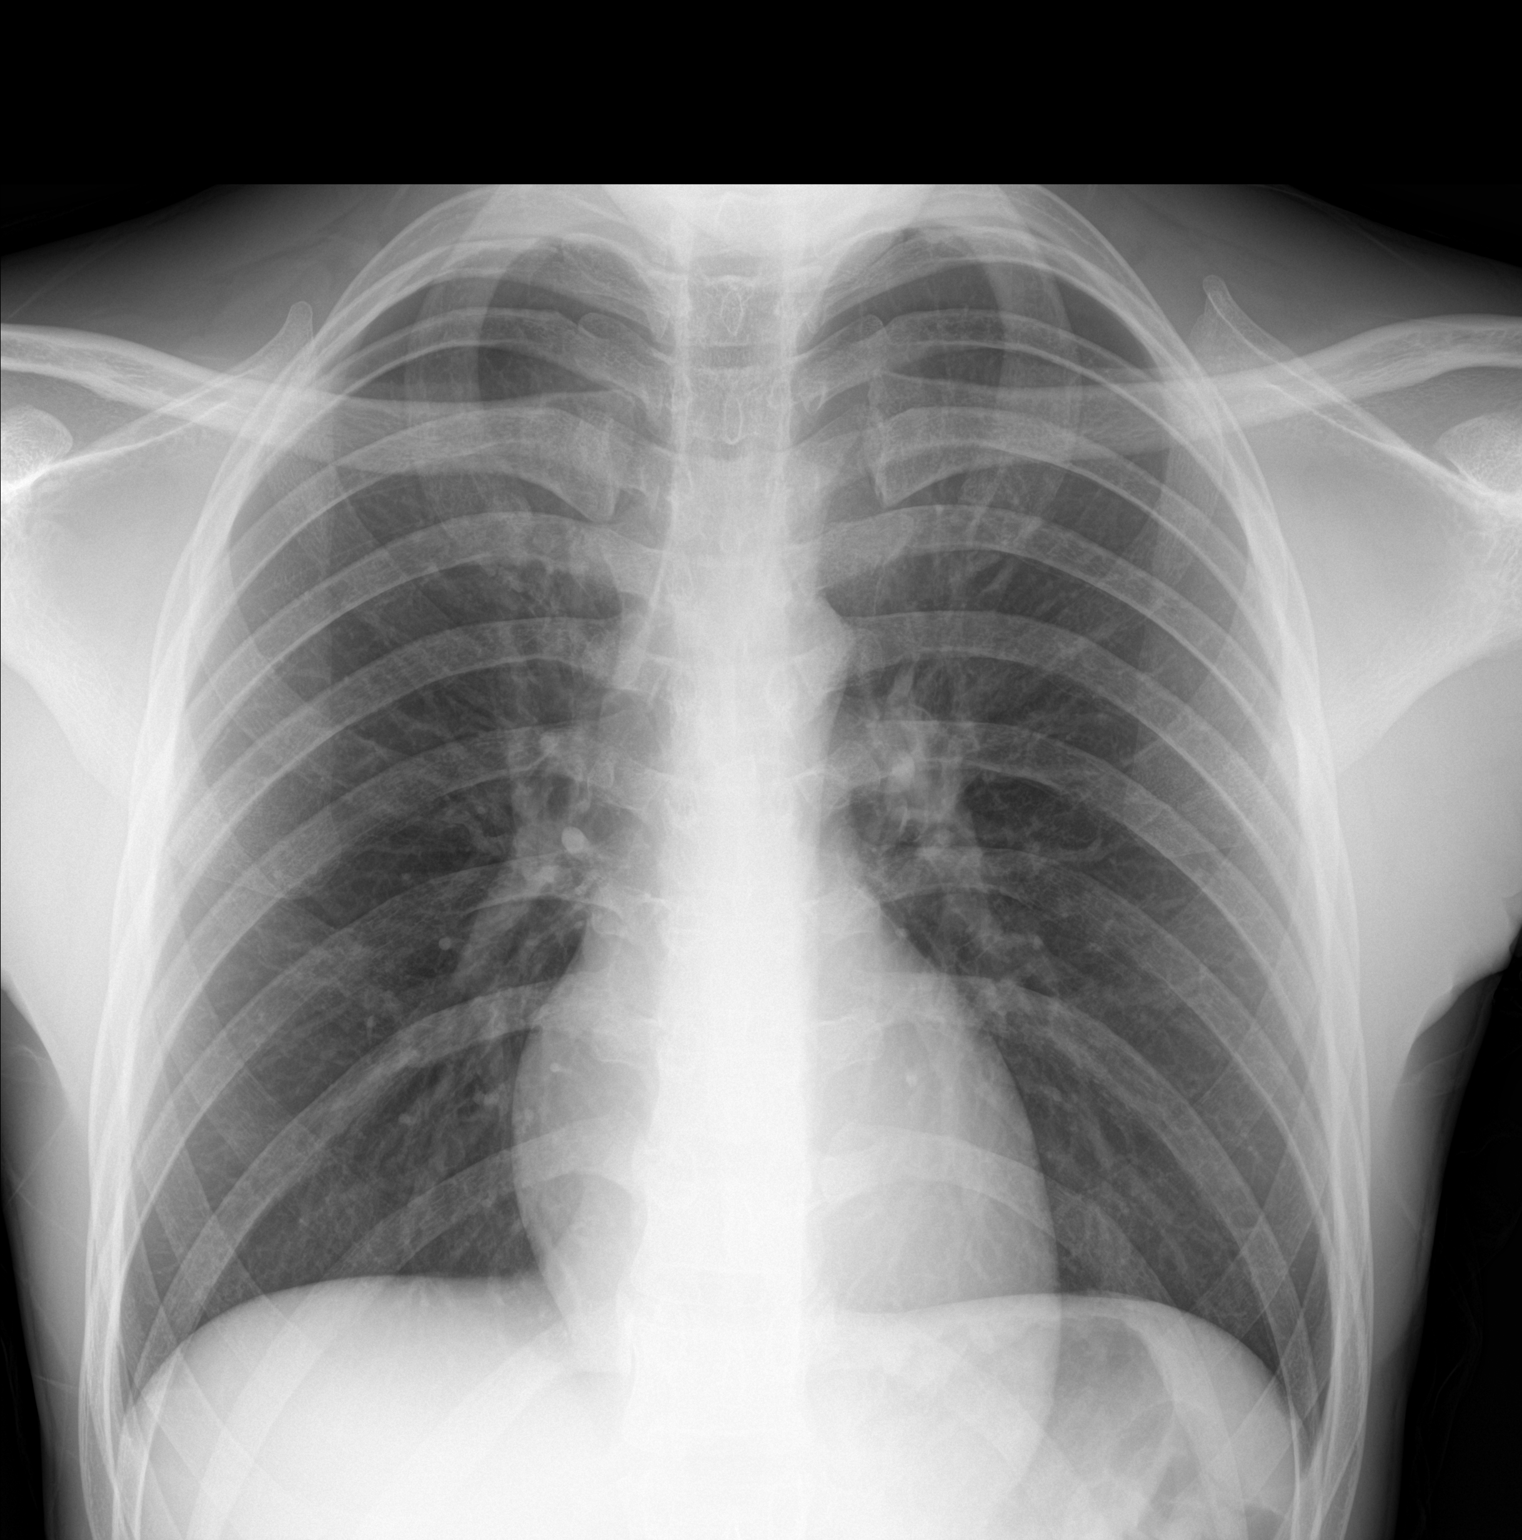

[1 of 1 positions shown; findings below may reference images not displayed]

FINDINGS: The heart size and mediastinal contours are within normal limits.
Both lungs are clear. The visualized skeletal structures are
unremarkable.
IMPRESSION: No active disease.

## 2021-10-26 MED ORDER — POTASSIUM CHLORIDE CRYS ER 20 MEQ PO TBCR
20.0000 meq | EXTENDED_RELEASE_TABLET | Freq: Once | ORAL | Status: AC
  Administered 2021-10-26: 20 meq via ORAL
  Filled 2021-10-26: qty 1

## 2021-10-26 NOTE — Discharge Instructions (Signed)
Please continue to monitor your symptoms closely and return to the emergency department with any new or worsening symptoms.  Please follow-up with your neurologist next week regarding your symptoms. ?

## 2021-10-26 NOTE — ED Notes (Signed)
PT ambulated around the department with minimal changes in his O2 saturation, and no complaints of SOB. ?

## 2021-10-26 NOTE — ED Triage Notes (Signed)
Reports mild shortness of breath, insomnia, dehydration, and lightheadedness x 3 days. Lungs clear.  ?

## 2021-10-26 NOTE — ED Provider Notes (Signed)
?[facility] DEPT ?Provider Note ? ? ?CSN: [id number] ?Arrival date & time: 10/26/21  0059 ? ?  ? ?History ? ?Chief Complaint  ?Patient presents with  ? Shortness of Breath  ? ? ?Terrence Riley is a 21 y.o. male. ? ?HPI ?Patient is a 21 year old male with a history of seizure disorder who presents to the emergency department with multiple complaints.  Patient states over the past 1 month he has been experiencing intermittent shortness of breath, insomnia, dehydration, as well as lightheadedness.  States that he has been intermittently taking Benadryl due to his difficulty sleeping and at times only sleeps 1 hour per night.  Since his symptoms began to worsen and he also then began experiencing mild shortness of breath as well as lightheadedness.  No syncope.  No chest pain, abdominal pain, nausea, vomiting, or diarrhea.  States that he is having normal p.o. intake.  Denies any seizures for the past 6 months.  Patient takes Klonopin, Keppra, Topamax, and states that he has been compliant with all of these medications.  Denies any recent dosing changes.  Denies any drug use, alcohol use, caffeine use.  He states that he is living at home and is helping take care of his mother who has cancer but states that he does not feel more stress than normal recently.  Denies any SI, HI, visual/auditory hallucinations. ?  ? ?Home Medications ?Prior to Admission medications   ?Medication Sig Start Date End Date Taking? Authorizing Provider  ?celecoxib (CELEBREX) 100 MG capsule TAKE 1 CAPSULE BY MOUTH TWICE A DAY 08/11/21   Magnant, Charles L, PA-C  ?cetirizine (ZYRTEC) 5 MG tablet Take 5 mg by mouth at bedtime.    [provider]  ?clonazePAM (KLONOPIN) 0.5 MG tablet Take 1 tablet (0.5 mg total) by mouth 2 (two) times daily. 09/18/21   Cameron Sprang, MD  ?cyanocobalamin (,VITAMIN B-12,) 1000 MCG/ML injection Inject 1 mL (1,000 mcg total) into the muscle every 30 (thirty) days. 06/27/21    [name], MD  ?EPINEPHrine 0.3 mg/0.3 mL IJ SOAJ injection Inject 0.3 mg into the muscle as needed for anaphylaxis. 07/10/20   McDonald, Mia A, PA-C  ?FLUoxetine (PROZAC) 20 MG capsule Take 1 capsule (20 mg total) by mouth daily. 07/25/21   Burchette, Alinda Sierras, MD  ?KEPPRA 1000 MG tablet Take 2 tablets (2,000 mg total) by mouth 2 (two) times daily. 09/18/21   Cameron Sprang, MD  ?lidocaine (LIDODERM) 5 % Place 1 patch onto the skin daily. Remove & Discard patch within 12 hours or as directed by MD ?Patient taking differently: Place 1 patch onto the skin daily as needed (pain). Remove & Discard patch within 12 hours or as directed by MD 02/25/20   Henderly, Britni A, PA-C  ?promethazine (PHENERGAN) 25 MG tablet Take 1 tablet (25 mg total) by mouth every 8 (eight) hours as needed for nausea or vomiting. 07/16/21   Burchette, Alinda Sierras, MD  ?topiramate (TOPAMAX) 200 MG tablet Take 1 tablet (200 mg total) by mouth 2 (two) times daily. 09/18/21   Cameron Sprang, MD  ?   ? ?Allergies    ?Lortab [hydrocodone-acetaminophen], Other, Zofran [ondansetron hcl], Citrus, Dairy aid [tilactase], Influenza virus vaccine, Soy allergy, and Hydrocodone-acetaminophen   ? ?Review of Systems   ?Review of Systems  ?All other systems reviewed and are negative. ?Ten systems reviewed and are negative for acute change, except as noted in the HPI.   ?Physical Exam ?Updated Vital Signs ?  BP 113/79 (BP Location: Right Arm)   Pulse (!) 57   Temp 97.9 ?F (36.6 ?C) (Oral)   Resp 20   SpO2 100%  ?Physical Exam ?Vitals and nursing note reviewed.  ?Constitutional:   ?   General: He is not in acute distress. ?   Appearance: Normal appearance. He is not ill-appearing, toxic-appearing or diaphoretic.  ?HENT:  ?   Head: Normocephalic and atraumatic.  ?   Right Ear: External ear normal.  ?   Left Ear: External ear normal.  ?   Nose: Nose normal.  ?   Mouth/Throat:  ?   Mouth: Mucous membranes are moist.  ?   Pharynx: Oropharynx is clear. No  oropharyngeal exudate or posterior oropharyngeal erythema.  ?Eyes:  ?   Extraocular Movements: Extraocular movements intact.  ?Cardiovascular:  ?   Rate and Rhythm: Normal rate and regular rhythm.  ?   Pulses: Normal pulses.  ?   Heart sounds: Normal heart sounds. No murmur heard. ?  No friction rub. No gallop.  ?Pulmonary:  ?   Effort: Pulmonary effort is normal. No respiratory distress.  ?   Breath sounds: Normal breath sounds. No stridor. No wheezing, rhonchi or rales.  ?Abdominal:  ?   General: Abdomen is flat.  ?   Tenderness: There is no abdominal tenderness.  ?Musculoskeletal:     ?   General: Normal range of motion.  ?   Cervical back: Normal range of motion and neck supple. No tenderness.  ?Skin: ?   General: Skin is warm and dry.  ?Neurological:  ?   General: No focal deficit present.  ?   Mental Status: He is alert and oriented to person, place, and time.  ?   Comments: A&O x3.  Speaking clearly, coherently, and in complete sentences.  Moving all 4 extremities with ease.  No gross deficits.  ?Psychiatric:     ?   Mood and Affect: Mood normal.     ?   Behavior: Behavior normal.  ? ? ?ED Results / Procedures / Treatments   ?Labs ?(all labs ordered are listed, but only abnormal results are displayed) ?Labs Reviewed  ?BASIC METABOLIC PANEL - Abnormal; Notable for the following components:  ?    Result Value  ? Potassium 3.3 (*)   ? CO2 21 (*)   ? All other components within normal limits  ?CBC WITH DIFFERENTIAL/PLATELET  ? ? ?EKG ?EKG Interpretation ? ?Date/Time:  Sunday October 26 2021 01:51:15 EDT ?Ventricular Rate:  69 ?PR Interval:  136 ?QRS Duration: 93 ?QT Interval:  386 ?QTC Calculation: 414 ?R Axis:   60 ?Text Interpretation: Sinus rhythm Early repolarization Confirmed by Veryl Speak [phone]) on 10/26/2021 2:07:59 AM ? ?Radiology ?DG Chest Portable 1 View ? ?Result Date: 10/26/2021 ?CLINICAL DATA:  Dyspnea EXAM: PORTABLE CHEST 1 VIEW COMPARISON:  08/06/2021 FINDINGS: The heart size and mediastinal contours  are within normal limits. Both lungs are clear. The visualized skeletal structures are unremarkable. IMPRESSION: No active disease. Electronically Signed   By: Fidela Salisbury M.D.   On: 10/26/2021 01:56   ? ?Procedures ?Procedures  ? ?Medications Ordered in ED ?Medications  ?potassium chloride SA (KLOR-CON M) CR tablet 20 mEq (20 mEq Oral Given 10/26/21 0344)  ? ? ?ED Course/ Medical Decision Making/ A&P ?  ?                        ?Medical Decision Making ?Amount and/or Complexity of Data Reviewed ?  Labs: ordered. ?Radiology: ordered. ? ?Risk ?Prescription drug management. ? ?Pt is a 21 y.o. male who presents to the emergency department due to difficulty sleeping, shortness of breath, lightheadedness. ? ?Labs: ?CBC without abnormalities. ?BMP with a potassium of 3.3 and a CO2 of 21. ? ?Imaging: ?Chest x-ray showed no active disease. ? ?ECG: ?Sinus rhythm with early repolarization. ? ?I, Rayna Sexton, PA-C, personally reviewed and evaluated these images and lab results as part of my medical decision-making. ? ?Patient states that about 1 month ago he began developing difficulty sleeping.  He has been taking Benadryl as needed but states that he will still only sleep 1 to 2 hours when taking this medication.  Denies any caffeine, alcohol, or drug use.  Denies any increase in stress recently.  Per records, it appears that he followed up with his PCP on March 24.  He was started on melatonin and was given a handout on sleep hygiene.  Does not appear that he has started taking melatonin.  He is also complaining of mild shortness of breath and lightheadedness.  On my exam his heart is regular rate and rhythm without murmurs, rubs, or gallops.  Lungs are clear to auscultation bilaterally.  Abdomen is soft and nontender.  Obtain basic labs which appears generally reassuring.  CBC without leukocytosis.  Mild hypokalemia 3.3 which was repleted with Klor-Con.  Afebrile, nontoxic-appearing, and nontachycardic.  Patient  ambulated with pulse ox and had no episodes of hypoxia. ? ?Patient appears stable for discharge at this time and he is agreeable.  Recommended that he also follow-up with neurology regarding his symptoms.  Discussed return pr

## 2021-10-29 ENCOUNTER — Telehealth (INDEPENDENT_AMBULATORY_CARE_PROVIDER_SITE_OTHER): Payer: Self-pay | Admitting: Family Medicine

## 2021-10-29 DIAGNOSIS — Z91199 Patient's noncompliance with other medical treatment and regimen due to unspecified reason: Secondary | ICD-10-CM

## 2021-10-29 NOTE — Progress Notes (Signed)
Pt did not show for appoinment ?

## 2021-11-16 ENCOUNTER — Other Ambulatory Visit: Payer: Self-pay

## 2021-11-16 ENCOUNTER — Emergency Department (HOSPITAL_COMMUNITY): Payer: BC Managed Care – PPO

## 2021-11-16 ENCOUNTER — Emergency Department (HOSPITAL_COMMUNITY)
Admission: EM | Admit: 2021-11-16 | Discharge: 2021-11-16 | Disposition: A | Discharge status: Home / Self Care | Payer: BC Managed Care – PPO | Attending: Emergency Medicine | Admitting: Emergency Medicine

## 2021-11-16 ENCOUNTER — Encounter (HOSPITAL_COMMUNITY): Payer: Self-pay | Admitting: Emergency Medicine

## 2021-11-16 DIAGNOSIS — Y92410 Unspecified street and highway as the place of occurrence of the external cause: Secondary | ICD-10-CM | POA: Diagnosis not present

## 2021-11-16 DIAGNOSIS — M542 Cervicalgia: Secondary | ICD-10-CM | POA: Insufficient documentation

## 2021-11-16 DIAGNOSIS — R519 Headache, unspecified: Secondary | ICD-10-CM | POA: Diagnosis not present

## 2021-11-16 DIAGNOSIS — R079 Chest pain, unspecified: Secondary | ICD-10-CM | POA: Diagnosis not present

## 2021-11-16 DIAGNOSIS — S20211A Contusion of right front wall of thorax, initial encounter: Secondary | ICD-10-CM | POA: Diagnosis not present

## 2021-11-16 DIAGNOSIS — R569 Unspecified convulsions: Secondary | ICD-10-CM | POA: Insufficient documentation

## 2021-11-16 DIAGNOSIS — S299XXA Unspecified injury of thorax, initial encounter: Secondary | ICD-10-CM | POA: Diagnosis not present

## 2021-11-16 DIAGNOSIS — Z041 Encounter for examination and observation following transport accident: Secondary | ICD-10-CM | POA: Diagnosis not present

## 2021-11-16 DIAGNOSIS — N281 Cyst of kidney, acquired: Secondary | ICD-10-CM | POA: Diagnosis not present

## 2021-11-16 DIAGNOSIS — R918 Other nonspecific abnormal finding of lung field: Secondary | ICD-10-CM | POA: Diagnosis not present

## 2021-11-16 DIAGNOSIS — T07XXXA Unspecified multiple injuries, initial encounter: Secondary | ICD-10-CM | POA: Diagnosis not present

## 2021-11-16 LAB — CBC WITH DIFFERENTIAL/PLATELET
Abs Immature Granulocytes: 0.01 10*3/uL (ref 0.00–0.07)
Basophils Absolute: 0.1 10*3/uL (ref 0.0–0.1)
Basophils Relative: 1 %
Eosinophils Absolute: 0.1 10*3/uL (ref 0.0–0.5)
Eosinophils Relative: 2 %
HCT: 36.6 % — ABNORMAL LOW (ref 39.0–52.0)
Hemoglobin: 12.5 g/dL — ABNORMAL LOW (ref 13.0–17.0)
Immature Granulocytes: 0 %
Lymphocytes Relative: 26 %
Lymphs Abs: 1.5 10*3/uL (ref 0.7–4.0)
MCH: 29.4 pg (ref 26.0–34.0)
MCHC: 34.2 g/dL (ref 30.0–36.0)
MCV: 86.1 fL (ref 80.0–100.0)
Monocytes Absolute: 0.5 10*3/uL (ref 0.1–1.0)
Monocytes Relative: 9 %
Neutro Abs: 3.6 10*3/uL (ref 1.7–7.7)
Neutrophils Relative %: 62 %
Platelets: 120 10*3/uL — ABNORMAL LOW (ref 150–400)
RBC: 4.25 MIL/uL (ref 4.22–5.81)
RDW: 12.3 % (ref 11.5–15.5)
WBC: 5.8 10*3/uL (ref 4.0–10.5)
nRBC: 0 % (ref 0.0–0.2)

## 2021-11-16 LAB — COMPREHENSIVE METABOLIC PANEL
ALT: 34 U/L (ref 0–44)
AST: 48 U/L — ABNORMAL HIGH (ref 15–41)
Albumin: 4 g/dL (ref 3.5–5.0)
Alkaline Phosphatase: 84 U/L (ref 38–126)
Anion gap: 9 (ref 5–15)
BUN: 19 mg/dL (ref 6–20)
CO2: 20 mmol/L — ABNORMAL LOW (ref 22–32)
Calcium: 8.4 mg/dL — ABNORMAL LOW (ref 8.9–10.3)
Chloride: 111 mmol/L (ref 98–111)
Creatinine, Ser: 1.36 mg/dL — ABNORMAL HIGH (ref 0.61–1.24)
GFR, Estimated: >60 mL/min (ref >60)
Glucose, Bld: 85 mg/dL (ref 70–99)
Potassium: 3.3 mmol/L — ABNORMAL LOW (ref 3.5–5.1)
Sodium: 140 mmol/L (ref 135–145)
Total Bilirubin: 0.6 mg/dL (ref 0.3–1.2)
Total Protein: 6.1 g/dL — ABNORMAL LOW (ref 6.5–8.1)

## 2021-11-16 LAB — I-STAT CHEM 8, ED
BUN: 21 mg/dL — ABNORMAL HIGH (ref 6–20)
Calcium, Ion: 1.06 mmol/L — ABNORMAL LOW (ref 1.15–1.40)
Chloride: 108 mmol/L (ref 98–111)
Creatinine, Ser: 1.4 mg/dL — ABNORMAL HIGH (ref 0.61–1.24)
Glucose, Bld: 81 mg/dL (ref 70–99)
HCT: 38 % — ABNORMAL LOW (ref 39.0–52.0)
Hemoglobin: 12.9 g/dL — ABNORMAL LOW (ref 13.0–17.0)
Potassium: 3.4 mmol/L — ABNORMAL LOW (ref 3.5–5.1)
Sodium: 140 mmol/L (ref 135–145)
TCO2: 21 mmol/L — ABNORMAL LOW (ref 22–32)
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
## 2021-11-16 MED ORDER — IOHEXOL 300 MG/ML  SOLN
100.0000 mL | Freq: Once | INTRAMUSCULAR | Status: AC | PRN
Start: 2021-11-16 — End: 2021-11-16
  Administered 2021-11-16: 100 mL via INTRAVENOUS

## 2021-11-16 MED ORDER — LEVETIRACETAM IN NACL 1000 MG/100ML IV SOLN
1000.0000 mg | Freq: Once | INTRAVENOUS | Status: AC
  Administered 2021-11-16: 1000 mg via INTRAVENOUS
  Filled 2021-11-16: qty 100

## 2021-11-16 MED ORDER — FENTANYL CITRATE PF 50 MCG/ML IJ SOSY
50.0000 ug | PREFILLED_SYRINGE | Freq: Once | INTRAMUSCULAR | Status: AC
  Administered 2021-11-16: 50 ug via INTRAVENOUS
  Filled 2021-11-16: qty 1
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
## 2021-11-16 NOTE — ED Notes (Signed)
Patient transported to CT 

## 2021-11-16 NOTE — Progress Notes (Signed)
Orthopedic Tech Progress Note ?Patient Details:  ?ATA PECHA ?2000-12-14 ?[id number] ? ?Patient ID: MICAEL BARB, male   DOB: [birth year], 21 y.o.   MRN: [id number] ?I attended trauma page. ?Karolee Stamps ?11/16/2021, 4:10 AM ? ?
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
## 2021-11-16 NOTE — Discharge Instructions (Addendum)
Follow-up with your neurologist in the office. ? ?Seizure precautions: ?Per [facility] statutes, patients with seizures are not allowed to drive until they have been seizure-free for six months and cleared by a physician  ?  ?Use caution when using heavy equipment or power tools. Avoid working on ladders or at heights. Take showers instead of baths. Ensure the water temperature is not too high on the home water heater. Do not go swimming alone. Do not lock yourself in a room alone (i.e. bathroom). When caring for infants or small children, sit down when holding, feeding, or changing them to minimize risk of injury to the child in the event you have a seizure. Maintain good sleep hygiene. Avoid alcohol.  ?  ?
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
## 2021-11-16 NOTE — ED Notes (Signed)
E-signature pad unavailable at time of pt discharge. This RN discussed discharge materials with pt and answered all pt questions. Pt stated understanding of discharge material. ? ?

## 2021-11-17 ENCOUNTER — Emergency Department (HOSPITAL_COMMUNITY)
Admission: EM | Admit: 2021-11-17 | Discharge: 2021-11-18 | Disposition: A | Discharge status: Home / Self Care | Payer: BC Managed Care – PPO | Attending: Emergency Medicine | Admitting: Emergency Medicine

## 2021-11-17 ENCOUNTER — Other Ambulatory Visit: Payer: Self-pay

## 2021-11-17 ENCOUNTER — Encounter (HOSPITAL_COMMUNITY): Payer: Self-pay

## 2021-11-17 DIAGNOSIS — R519 Headache, unspecified: Secondary | ICD-10-CM | POA: Diagnosis not present

## 2021-11-17 DIAGNOSIS — M542 Cervicalgia: Secondary | ICD-10-CM | POA: Insufficient documentation

## 2021-11-17 DIAGNOSIS — R112 Nausea with vomiting, unspecified: Secondary | ICD-10-CM | POA: Insufficient documentation

## 2021-11-17 DIAGNOSIS — R079 Chest pain, unspecified: Secondary | ICD-10-CM | POA: Diagnosis not present

## 2021-11-17 DIAGNOSIS — Y92410 Unspecified street and highway as the place of occurrence of the external cause: Secondary | ICD-10-CM | POA: Diagnosis not present
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
## 2021-11-18 MED ORDER — PROMETHAZINE HCL 12.5 MG RE SUPP: 12.5000 mg | Freq: Three times a day (TID) | RECTAL | 0 refills | Status: DC | PRN
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
## 2021-11-18 NOTE — Discharge Instructions (Signed)
Imaging from yesterday was reassuring, it is possible that you have a slight concussion, symptoms include a mild headache, brain fog, dizziness, lightheadedness, difficulty concentrating, increase sensitivity to light or noise.  The symptoms will resolve on their own I recommend brain rest i.e. decreasing screen time, vigorous activities and slowly reintroduce them as tolerated.  If your symptoms persist over the weeks time please follow-up with your PCP and/or the concussion clinic for further evaluation you may take over-the-counter pain medication as needed. ? ?Come back to the emergency department if you develop chest pain, shortness of breath, severe abdominal pain, uncontrolled nausea, vomiting, diarrhea. ? ? ?

## 2021-11-19 ENCOUNTER — Encounter: Payer: Self-pay | Admitting: Family Medicine

## 2021-11-19 ENCOUNTER — Telehealth (INDEPENDENT_AMBULATORY_CARE_PROVIDER_SITE_OTHER): Payer: BC Managed Care – PPO | Admitting: Family Medicine

## 2021-11-19 VITALS — Ht 72.0 in | Wt 125.0 lb

## 2021-11-19 DIAGNOSIS — M542 Cervicalgia: Secondary | ICD-10-CM | POA: Diagnosis not present

## 2021-11-19 MED ORDER — METAXALONE 800 MG PO TABS: 800.0000 mg | ORAL_TABLET | Freq: Three times a day (TID) | ORAL | 0 refills | Status: DC | PRN

## 2021-11-19 MED ORDER — CELECOXIB 100 MG PO CAPS: 100.0000 mg | ORAL_CAPSULE | Freq: Two times a day (BID) | ORAL | 0 refills | Status: DC
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
## 2021-12-05 ENCOUNTER — Encounter: Payer: Self-pay | Admitting: Family Medicine

## 2021-12-05 ENCOUNTER — Ambulatory Visit (INDEPENDENT_AMBULATORY_CARE_PROVIDER_SITE_OTHER): Payer: BC Managed Care – PPO | Admitting: Family Medicine

## 2021-12-05 VITALS — BP 110/76 | HR 58 | Temp 97.4°F | Ht 72.0 in | Wt 126.9 lb

## 2021-12-05 DIAGNOSIS — M545 Low back pain, unspecified: Secondary | ICD-10-CM
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
## 2022-01-07 ENCOUNTER — Ambulatory Visit: Payer: BC Managed Care – PPO | Attending: Physical Therapy | Admitting: Physical Therapy

## 2022-01-08 ENCOUNTER — Other Ambulatory Visit: Payer: Self-pay

## 2022-01-08 ENCOUNTER — Emergency Department (HOSPITAL_COMMUNITY)
Admission: EM | Admit: 2022-01-08 | Discharge: 2022-01-08 | Disposition: A | Discharge status: Home / Self Care | Payer: BC Managed Care – PPO | Attending: Emergency Medicine | Admitting: Emergency Medicine

## 2022-01-08 ENCOUNTER — Encounter (HOSPITAL_COMMUNITY): Payer: Self-pay

## 2022-01-08 ENCOUNTER — Emergency Department (HOSPITAL_COMMUNITY): Payer: BC Managed Care – PPO

## 2022-01-08 DIAGNOSIS — R779 Abnormality of plasma protein, unspecified: Secondary | ICD-10-CM | POA: Diagnosis not present

## 2022-01-08 DIAGNOSIS — R404 Transient alteration of awareness: Secondary | ICD-10-CM | POA: Diagnosis not present

## 2022-01-08 DIAGNOSIS — R7989 Other specified abnormal findings of blood chemistry: Secondary | ICD-10-CM | POA: Insufficient documentation

## 2022-01-08 DIAGNOSIS — Y900 Blood alcohol level of less than 20 mg/100 ml: Secondary | ICD-10-CM | POA: Insufficient documentation

## 2022-01-08 DIAGNOSIS — D649 Anemia, unspecified: Secondary | ICD-10-CM | POA: Diagnosis not present

## 2022-01-08 DIAGNOSIS — R569 Unspecified convulsions: Secondary | ICD-10-CM | POA: Diagnosis not present

## 2022-01-08 DIAGNOSIS — G40909 Epilepsy, unspecified, not intractable, without status epilepticus: Secondary | ICD-10-CM | POA: Insufficient documentation

## 2022-01-08 DIAGNOSIS — R4020 Unspecified coma: Secondary | ICD-10-CM | POA: Diagnosis not present

## 2022-01-08 DIAGNOSIS — J45909 Unspecified asthma, uncomplicated: Secondary | ICD-10-CM | POA: Diagnosis not present

## 2022-01-08 DIAGNOSIS — S199XXA Unspecified injury of neck, initial encounter: Secondary | ICD-10-CM | POA: Diagnosis not present

## 2022-01-08 DIAGNOSIS — E878 Other disorders of electrolyte and fluid balance, not elsewhere classified: Secondary | ICD-10-CM | POA: Insufficient documentation

## 2022-01-08 DIAGNOSIS — E876 Hypokalemia: Secondary | ICD-10-CM | POA: Diagnosis not present

## 2022-01-08 LAB — COMPREHENSIVE METABOLIC PANEL
ALT: 32 U/L (ref 0–44)
AST: 38 U/L (ref 15–41)
Albumin: 4.3 g/dL (ref 3.5–5.0)
Alkaline Phosphatase: 82 U/L (ref 38–126)
Anion gap: 10 (ref 5–15)
BUN: 15 mg/dL (ref 6–20)
CO2: 21 mmol/L — ABNORMAL LOW (ref 22–32)
Calcium: 9.8 mg/dL (ref 8.9–10.3)
Chloride: 110 mmol/L (ref 98–111)
Creatinine, Ser: 1.3 mg/dL — ABNORMAL HIGH (ref 0.61–1.24)
GFR, Estimated: >60 mL/min (ref >60)
Glucose, Bld: 81 mg/dL (ref 70–99)
Potassium: 3 mmol/L — ABNORMAL LOW (ref 3.5–5.1)
Sodium: 141 mmol/L (ref 135–145)
Total Bilirubin: 0.8 mg/dL (ref 0.3–1.2)
Total Protein: 6.4 g/dL — ABNORMAL LOW (ref 6.5–8.1)

## 2022-01-08 LAB — CBC WITH DIFFERENTIAL/PLATELET
Abs Immature Granulocytes: 0.02 10*3/uL (ref 0.00–0.07)
Basophils Absolute: 0 10*3/uL (ref 0.0–0.1)
Basophils Relative: 1 %
Eosinophils Absolute: 0 10*3/uL (ref 0.0–0.5)
Eosinophils Relative: 0 %
HCT: 37.5 % — ABNORMAL LOW (ref 39.0–52.0)
Hemoglobin: 12.9 g/dL — ABNORMAL LOW (ref 13.0–17.0)
Immature Granulocytes: 0 %
Lymphocytes Relative: 16 %
Lymphs Abs: 1.3 10*3/uL (ref 0.7–4.0)
MCH: 29.1 pg (ref 26.0–34.0)
MCHC: 34.4 g/dL (ref 30.0–36.0)
MCV: 84.5 fL (ref 80.0–100.0)
Monocytes Absolute: 0.7 10*3/uL (ref 0.1–1.0)
Monocytes Relative: 9 %
Neutro Abs: 6.1 10*3/uL (ref 1.7–7.7)
Neutrophils Relative %: 74 %
Platelets: 154 10*3/uL (ref 150–400)
RBC: 4.44 MIL/uL (ref 4.22–5.81)
RDW: 12.4 % (ref 11.5–15.5)
WBC: 8.2 10*3/uL (ref 4.0–10.5)
nRBC: 0 % (ref 0.0–0.2)

## 2022-01-08 LAB — CK: Total CK: 603 U/L — ABNORMAL HIGH (ref 49–397)

## 2022-01-08 LAB — CBG MONITORING, ED: Glucose-Capillary: 71 mg/dL (ref 70–99)

## 2022-01-08 LAB — MAGNESIUM: Magnesium: 1.7 mg/dL (ref 1.7–2.4)

## 2022-01-08 LAB — ETHANOL: Alcohol, Ethyl (B): <10 mg/dL (ref <10)

## 2022-01-08 IMAGING — CT CT CERVICAL SPINE W/O CM
3 of 4 series · 12 of 33 positions shown, 14 images · non-contrast
Comparison: [DATE]

CLINICAL DATA: Seizure and head/neck trauma



[Series 5: c_spine 2.0 (person_name) (person_name) · axial · 0.38mm/px · z∈[-298,-178]mm · 4 of 91 slices shown, 5 images]
[im 16/91  soft-tissue]
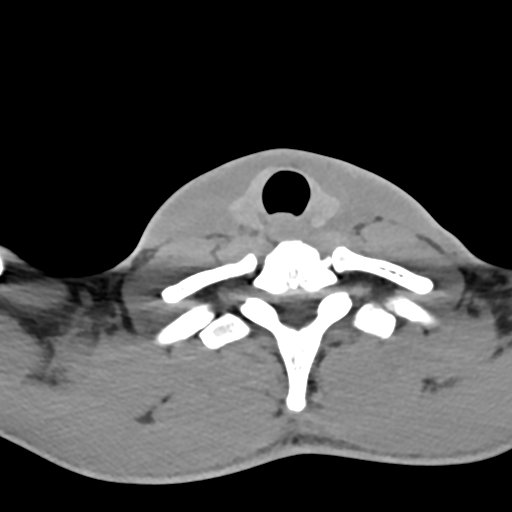
[im 16/91  bone]
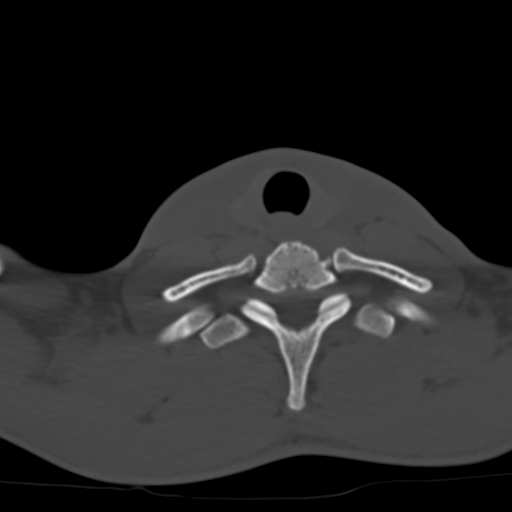
[im 31/91  bone]
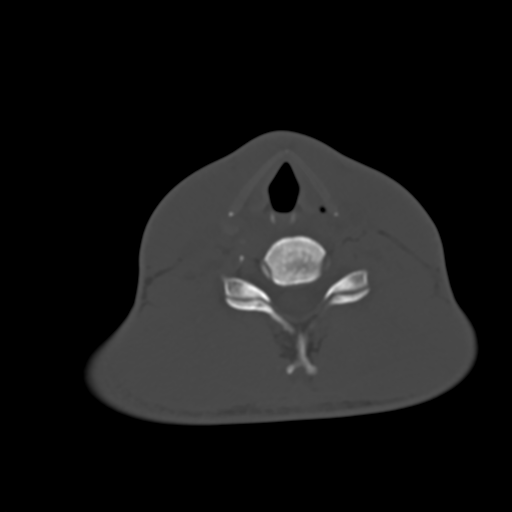
[im 61/91  bone]
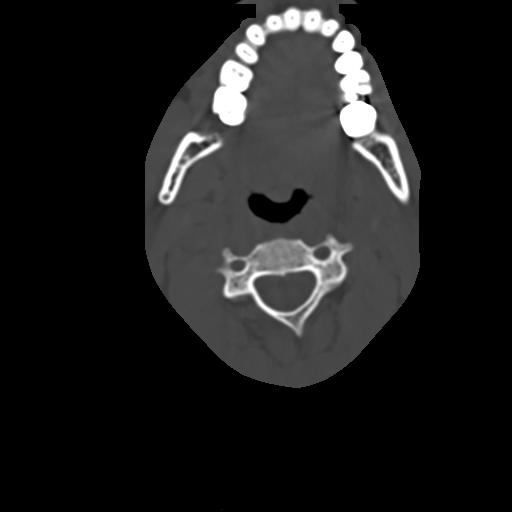
[im 76/91  bone]
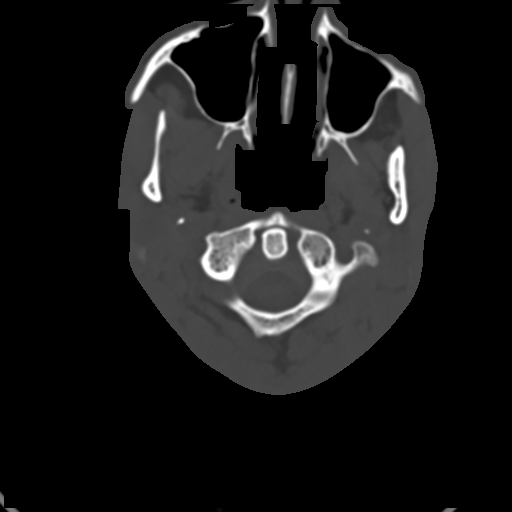

[Series 6: coronal bone · coronal · 0.27mm/px · 3 of 61 slices shown]
[im 13/61  bone]
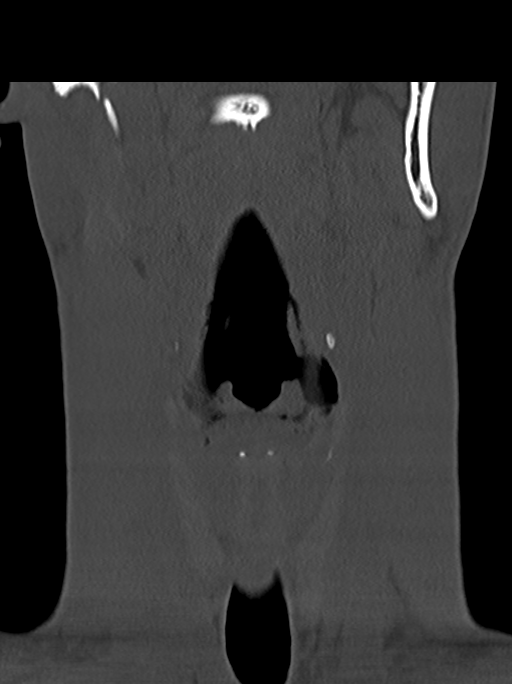
[im 25/61  bone]
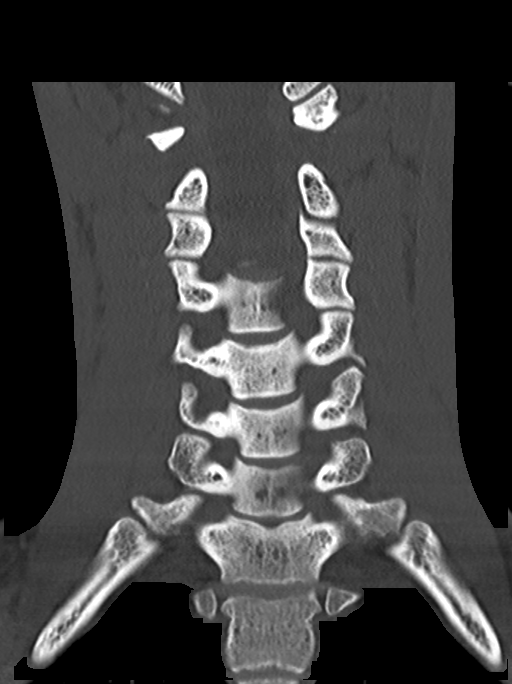
[im 37/61  bone]
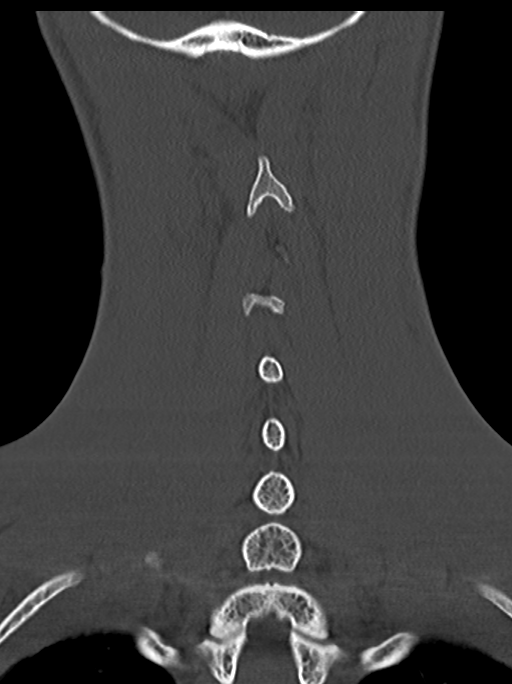

[Series 7: sagittal bone · sagittal · 0.23mm/px · 5 of 61 slices shown, 6 images]
[im 21/61  bone]
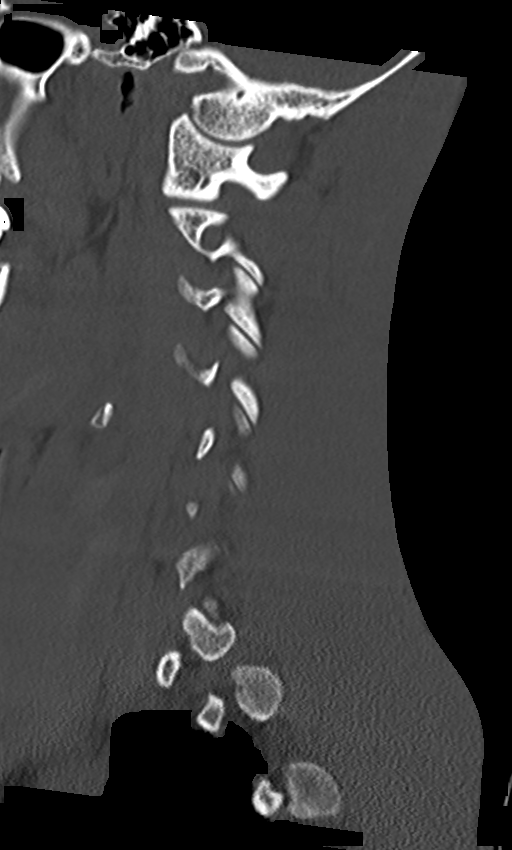
[im 26/61  bone]
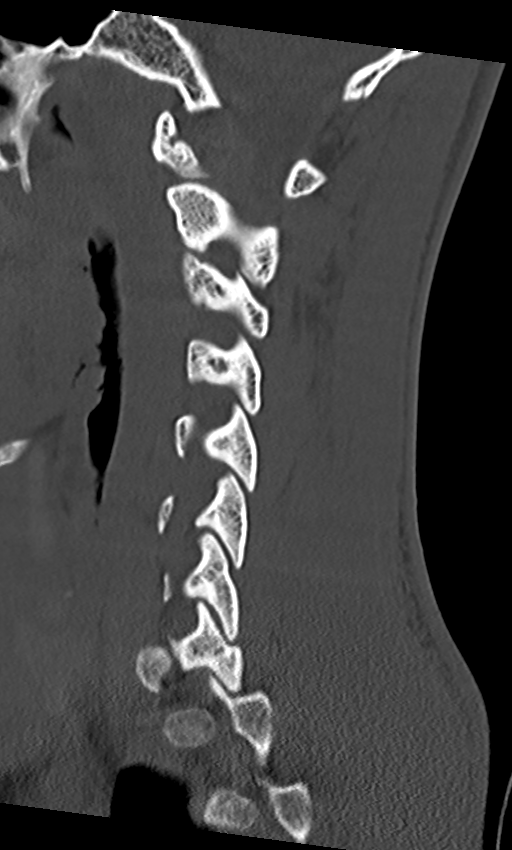
[im 31/61  soft-tissue]
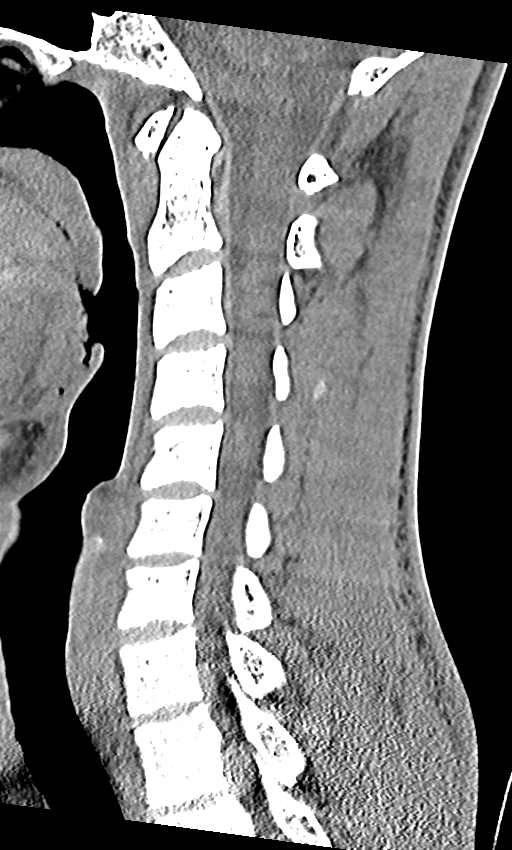
[im 31/61  bone]
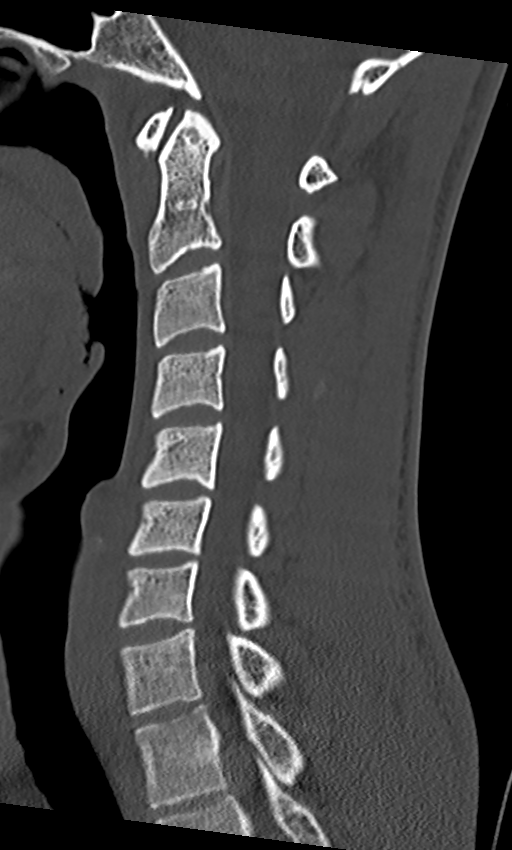
[im 36/61  bone]
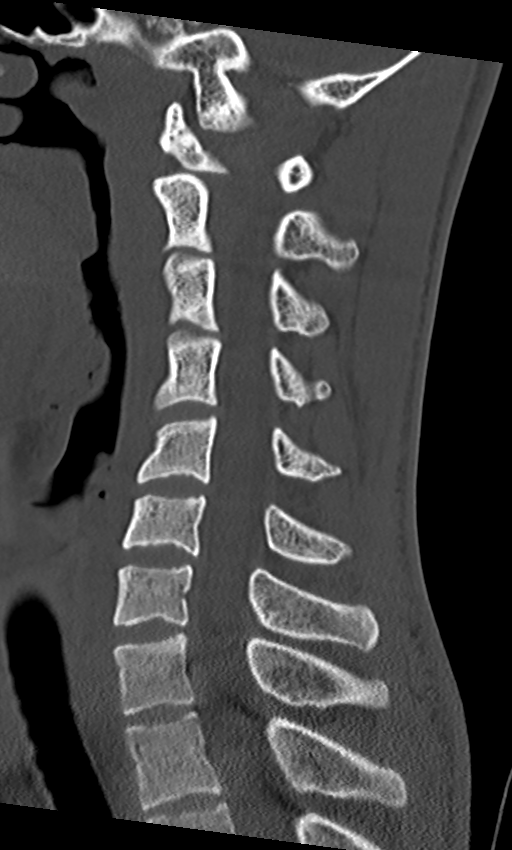
[im 41/61  bone]
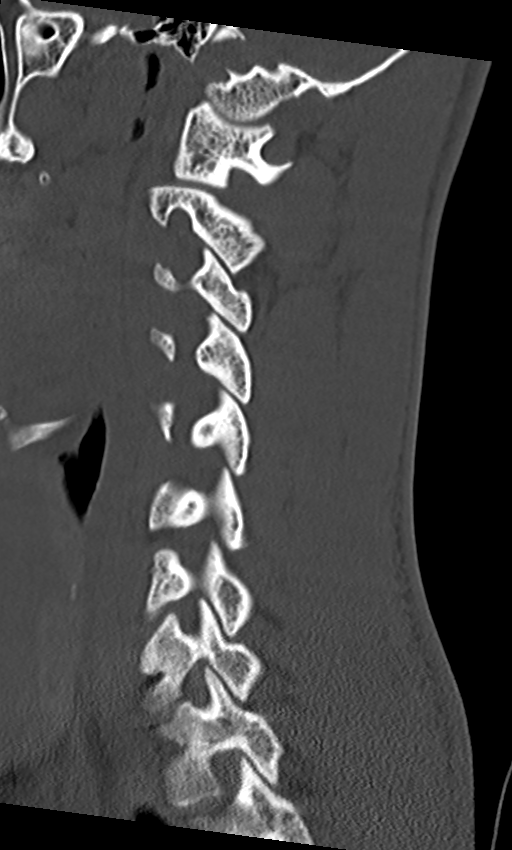

[12 of 33 positions shown; findings below may reference images not displayed]

FINDINGS: CT HEAD FINDINGS

Brain: No evidence of acute infarction, hemorrhage, cerebral edema,
mass, mass effect, or midline shift. No hydrocephalus or extra-axial
fluid collection.

Vascular: No hyperdense vessel.

Skull: Normal. Negative for fracture or focal lesion.

Sinuses/Orbits: The orbits are unremarkable. Small mucous retention
cysts in the maxillary sinuses.

Other: The mastoid air cells are well aerated.

CT CERVICAL SPINE FINDINGS

Alignment: No listhesis. Mild reversal of the normal cervical
lordosis may be positional.

Skull base and vertebrae: No acute fracture. No primary bone lesion
or focal pathologic process. Dental caries in the right mandibular
first molar.

Soft tissues and spinal canal: No prevertebral fluid or swelling. No
visible canal hematoma.

Disc levels:  Disc heights are preserved.  No spinal canal stenosis.

Upper chest: Negative.

Other: None.
IMPRESSION: 1.  No acute intracranial process.
2.  No acute fracture or traumatic listhesis in the cervical spine.
3. Dental caries in the right mandibular first molar.

## 2022-01-08 IMAGING — CT CT HEAD W/O CM
3 series · 15 of 47 positions shown, 18 images · non-contrast
Comparison: [DATE]

CLINICAL DATA: Seizure and head/neck trauma



[Series 3: head 5.0 h30s · axial · 0.43mm/px · z∈[-171,-31]mm · 9 of 34 slices shown, 12 images]
[im 3/34  brain]
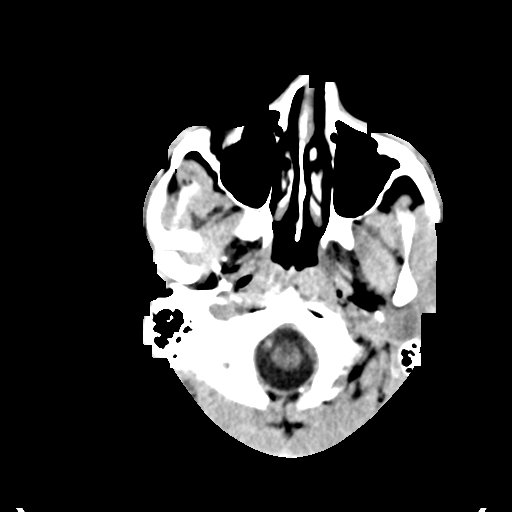
[im 3/34  bone]
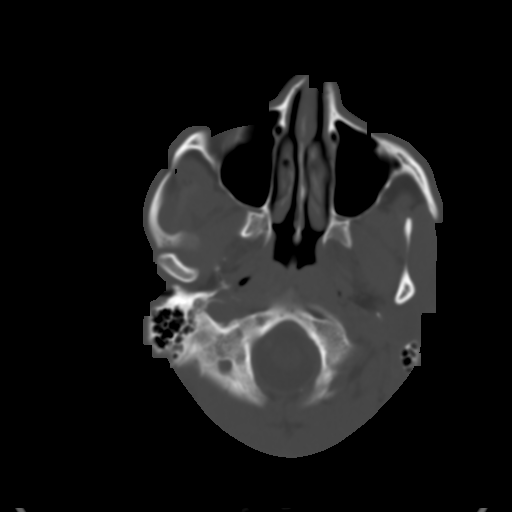
[im 6/34  brain]
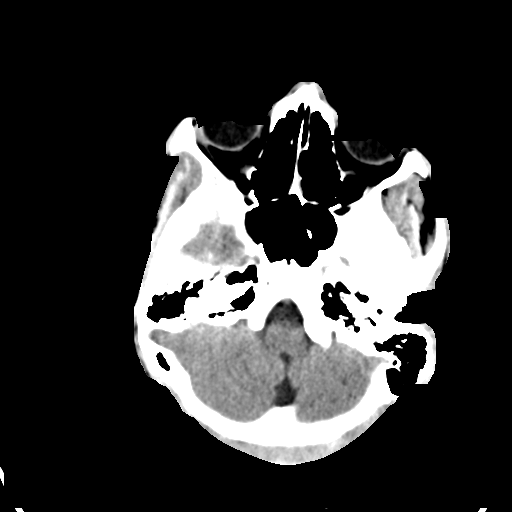
[im 10/34  brain]
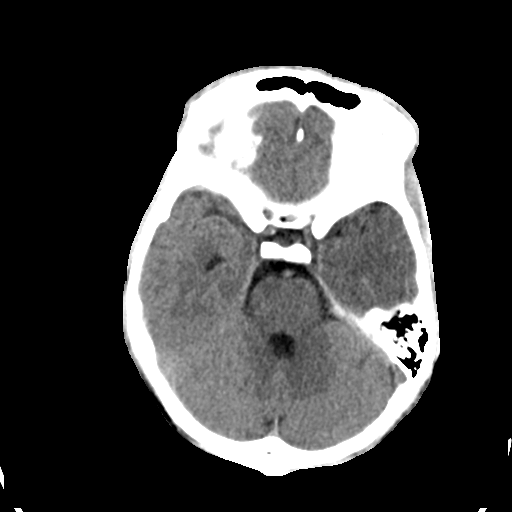
[im 13/34  brain]
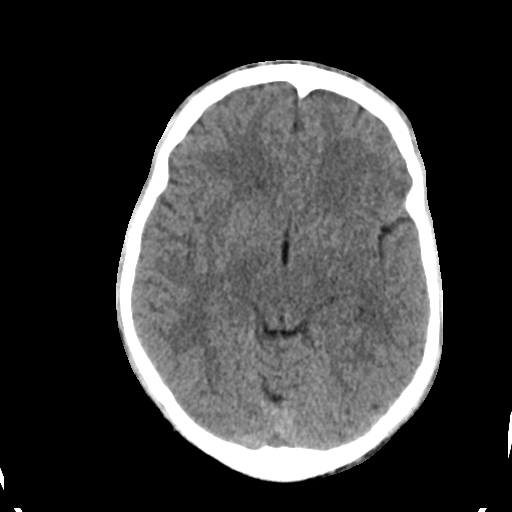
[im 18/34  brain]
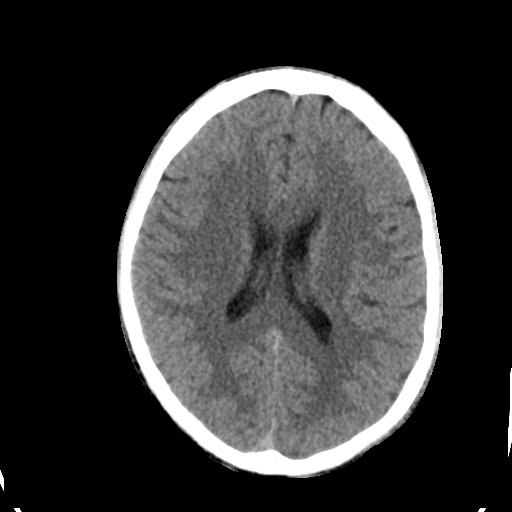
[im 18/34  bone]
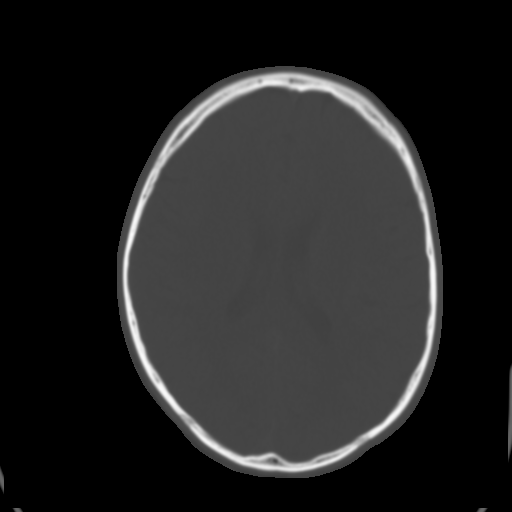
[im 21/34  brain]
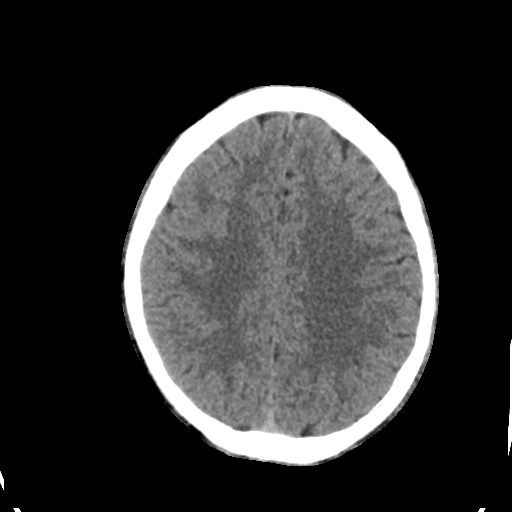
[im 24/34  brain]
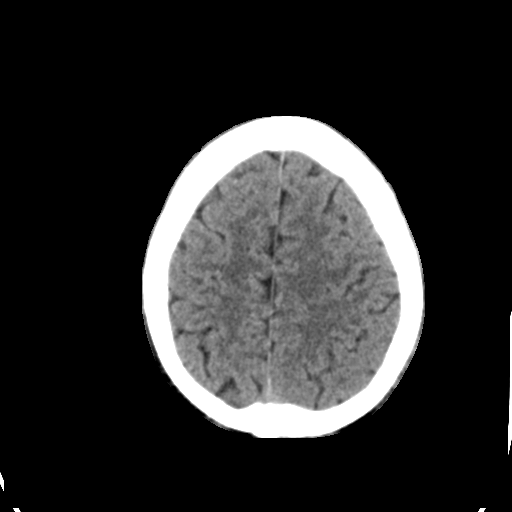
[im 28/34  brain]
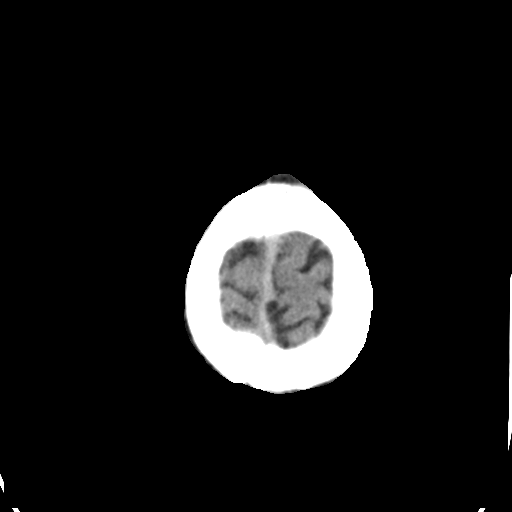
[im 31/34  brain]
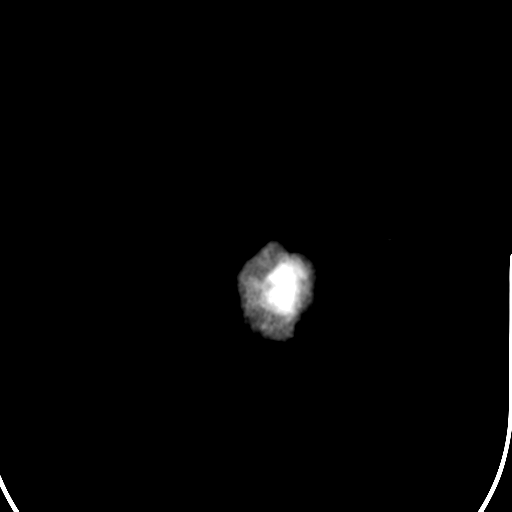
[im 31/34  bone]
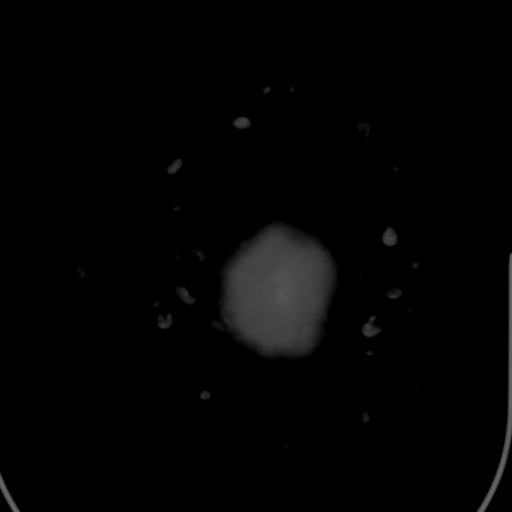

[Series 5: head 3.0 mpr cor · coronal · 0.33mm/px · 3 of 67 slices shown]
[im 23/67  brain]
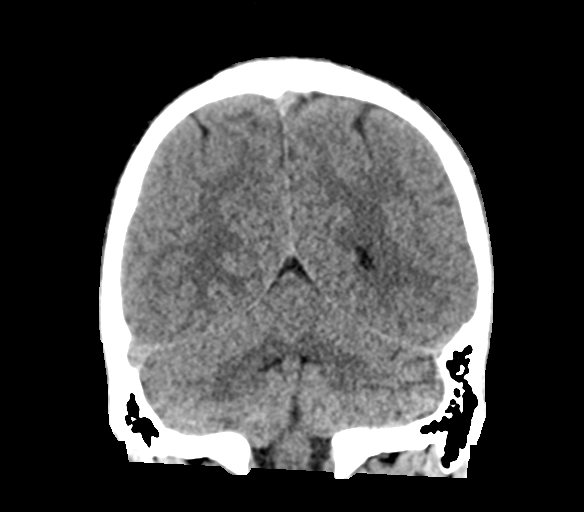
[im 30/67  brain]
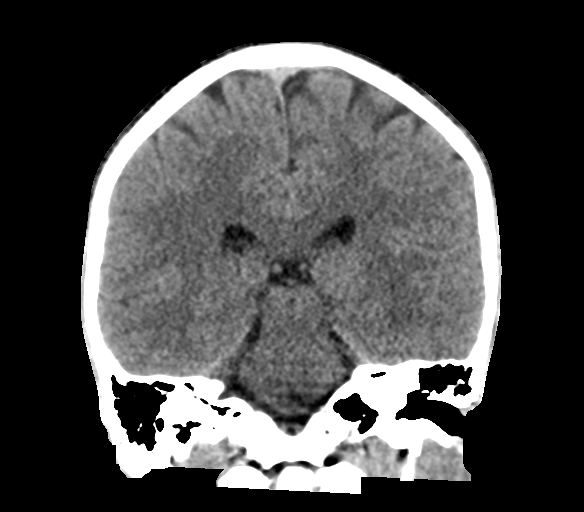
[im 37/67  brain]
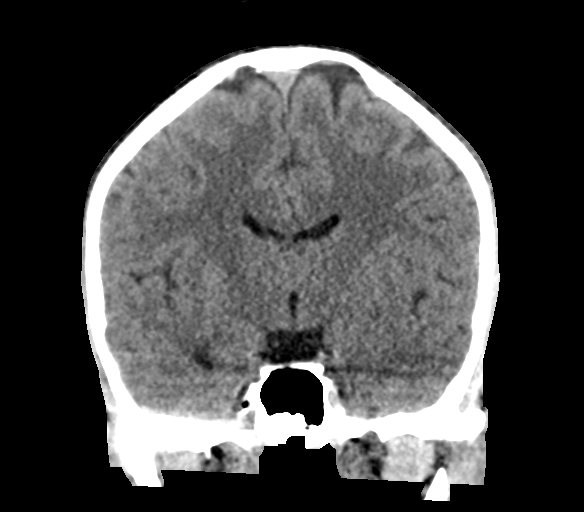

[Series 6: head 3.0 mpr sag · sagittal · 0.32mm/px · 3 of 59 slices shown]
[im 20/59  brain]
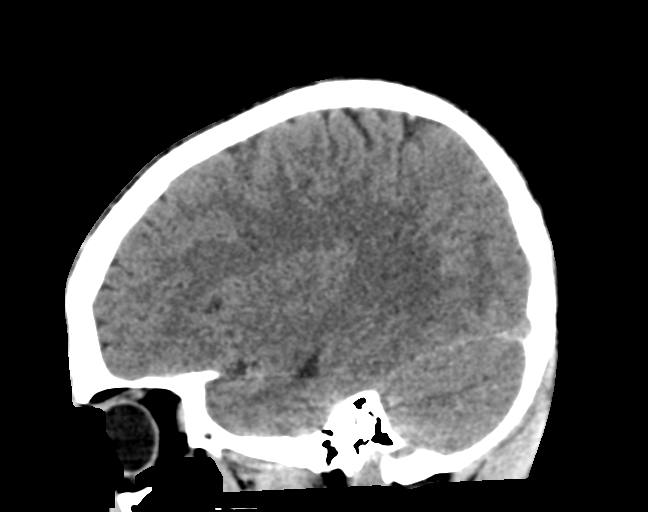
[im 30/59  brain]
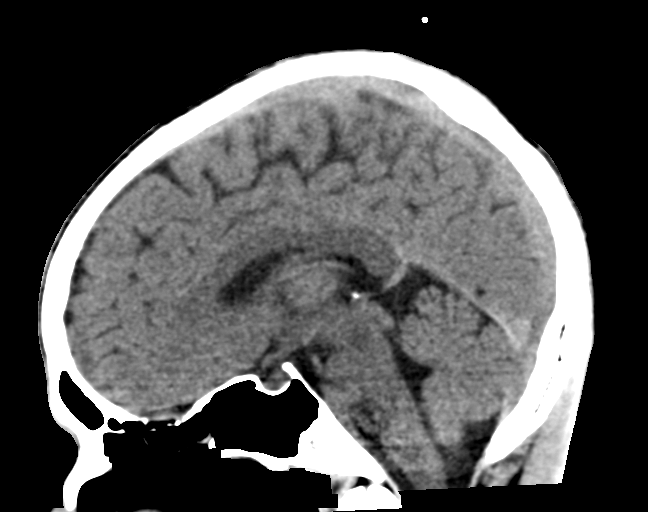
[im 39/59  brain]
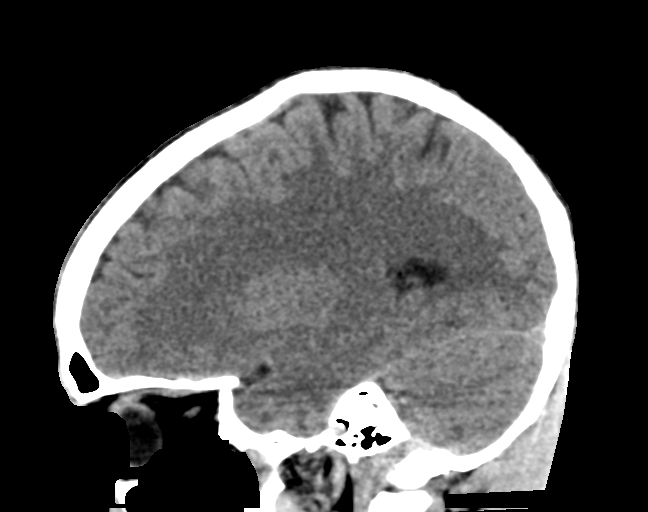

[15 of 47 positions shown; findings below may reference images not displayed]

FINDINGS: CT HEAD FINDINGS

Brain: No evidence of acute infarction, hemorrhage, cerebral edema,
mass, mass effect, or midline shift. No hydrocephalus or extra-axial
fluid collection.

Vascular: No hyperdense vessel.

Skull: Normal. Negative for fracture or focal lesion.

Sinuses/Orbits: The orbits are unremarkable. Small mucous retention
cysts in the maxillary sinuses.

Other: The mastoid air cells are well aerated.

CT CERVICAL SPINE FINDINGS

Alignment: No listhesis. Mild reversal of the normal cervical
lordosis may be positional.

Skull base and vertebrae: No acute fracture. No primary bone lesion
or focal pathologic process. Dental caries in the right mandibular
first molar.

Soft tissues and spinal canal: No prevertebral fluid or swelling. No
visible canal hematoma.

Disc levels:  Disc heights are preserved.  No spinal canal stenosis.

Upper chest: Negative.

Other: None.
IMPRESSION: 1.  No acute intracranial process.
2.  No acute fracture or traumatic listhesis in the cervical spine.
3. Dental caries in the right mandibular first molar.

## 2022-01-08 MED ORDER — POTASSIUM CHLORIDE CRYS ER 20 MEQ PO TBCR
40.0000 meq | EXTENDED_RELEASE_TABLET | Freq: Once | ORAL | Status: AC
  Administered 2022-01-08: 40 meq via ORAL
  Filled 2022-01-08: qty 2

## 2022-01-08 MED ORDER — LEVETIRACETAM 500 MG PO TABS
1000.0000 mg | ORAL_TABLET | Freq: Once | ORAL | Status: DC
  Filled 2022-01-08: qty 2

## 2022-01-08 MED ORDER — LACTATED RINGERS IV BOLUS
1000.0000 mL | Freq: Once | INTRAVENOUS | Status: AC
  Administered 2022-01-08: 1000 mL via INTRAVENOUS

## 2022-01-08 MED ORDER — KETOROLAC TROMETHAMINE 15 MG/ML IJ SOLN
15.0000 mg | Freq: Once | INTRAMUSCULAR | Status: AC
Start: 2022-01-08 — End: 2022-01-08
  Administered 2022-01-08: 15 mg via INTRAVENOUS
  Filled 2022-01-08: qty 1

## 2022-01-08 NOTE — Discharge Instructions (Addendum)
You were seen in the emergency department for evaluation after your seizure.  I think this is from you underdosing yourself with Keppra.  Per your last neurology note, your since been taking Keppra twice daily.  You will need to not drive a car for the next 6 months per [facility] law given your seizure activity.  Please follow-up with your neurologist and call schedule appointment soon.  Do not swim alone or take baths as you can drown.  You also have a low potassium, follow with your PCP for recheck of your potassium.  If you have any concern, new or worsening symptoms, please return to the nearest ER for re-evaluation.   Contact a doctor if: You have another seizure or seizures. Call the doctor each time you have a seizure. The pattern of your seizures changes. You keep having seizures with treatment. You have symptoms of being sick or having an infection. You are not able to take your medicine. Get help right away if: You have any of these problems: A seizure that lasts longer than 5 minutes. Many seizures in a row and you do not feel better between seizures. A seizure that makes it harder to breathe. A seizure and you can no longer speak or use part of your body. You do not wake up right after a seizure. You get hurt during a seizure. You feel confused or have pain right after a seizure. These symptoms may be an emergency. Get help right away. Call your local emergency services (911 in the [place] Do not wait to see if the symptoms will go away. Do not drive yourself to the hospital.
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
## 2022-01-09 LAB — LEVETIRACETAM LEVEL: Levetiracetam Lvl: <2 ug/mL — ABNORMAL LOW (ref 10.0–40.0)

## 2022-01-21 ENCOUNTER — Ambulatory Visit: Payer: BC Managed Care – PPO | Admitting: Physical Therapy

## 2022-02-16 ENCOUNTER — Telehealth: Payer: Self-pay | Admitting: Family Medicine

## 2022-02-16 NOTE — Telephone Encounter (Signed)
Pt mom is calling and would like pt to have blood work done . Pt has a history of dizziness and nausea and per mom doctor burchette is aware

## 2022-02-17 ENCOUNTER — Ambulatory Visit: Payer: BC Managed Care – PPO | Admitting: Family Medicine

## 2022-02-17 NOTE — Telephone Encounter (Signed)
F/u scheduled 

## 2022-02-18 ENCOUNTER — Ambulatory Visit: Payer: BC Managed Care – PPO | Admitting: Family Medicine

## 2022-02-20 ENCOUNTER — Ambulatory Visit (INDEPENDENT_AMBULATORY_CARE_PROVIDER_SITE_OTHER): Payer: BC Managed Care – PPO | Admitting: Family Medicine

## 2022-02-20 ENCOUNTER — Encounter: Payer: Self-pay | Admitting: Family Medicine

## 2022-02-20 VITALS — BP 106/68 | HR 55 | Temp 97.5°F | Ht 72.0 in | Wt 125.6 lb

## 2022-02-20 DIAGNOSIS — G4700 Insomnia, unspecified: Secondary | ICD-10-CM

## 2022-02-20 DIAGNOSIS — G40909 Epilepsy, unspecified, not intractable, without status epilepticus: Secondary | ICD-10-CM | POA: Diagnosis not present

## 2022-02-20 DIAGNOSIS — R5383 Other fatigue: Secondary | ICD-10-CM

## 2022-02-20 LAB — TSH: TSH: 0.62 u[IU]/mL (ref 0.35–5.50)

## 2022-02-20 LAB — BASIC METABOLIC PANEL
BUN: 10 mg/dL (ref 6–23)
CO2: 24 mEq/L (ref 19–32)
Calcium: 9.4 mg/dL (ref 8.4–10.5)
Chloride: 108 mEq/L (ref 96–112)
Creatinine, Ser: 1.25 mg/dL (ref 0.40–1.50)
GFR: 82.24 mL/min (ref >60.00)
Glucose, Bld: 89 mg/dL (ref 70–99)
Potassium: 4.1 mEq/L (ref 3.5–5.1)
Sodium: 138 mEq/L (ref 135–145)

## 2022-02-20 LAB — VITAMIN B12: Vitamin B-12: 148 pg/mL — ABNORMAL LOW (ref 211–911)

## 2022-02-20 NOTE — Patient Instructions (Signed)
Try to get more sleep!  Avoid bright lights within 2-3 hours of bedtime, if possible.

## 2022-02-20 NOTE — Progress Notes (Signed)
Established Patient Office Visit  Subjective   Patient ID: Terrence Riley, male    DOB: 2000-12-13  Age: 21 y.o. MRN: [id number]  Chief Complaint  Patient presents with   Fatigue    Patient complains of fatigue   Labs Only    HPI   Here with chief complaint of fatigue.  He has daily pervasive fatigue.  Sometimes takes about a 10-hour nap during the day but very poor sleep quality at night.  He has difficulty falling asleep and staying asleep.  He states that he typically only sleeps about 3 to 4 hours most nights.  He has history of migraines, seizure disorder, past history of GERD.  Followed by neurology for his seizures.  He had admission last winter and was noted to have fairly severe B12 deficiency.  He took injections for a while.  None recently.  Last B12 level 306.  Had low potassium with last labs.  No diuretic use.  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  Very physical work.  No consistent electrolyte replacement.  Past Medical History:  Diagnosis Date   Allergy    rhinitis   Asthma    as a child   Complication of anesthesia    pt. reports he need more anesthesia with the septoplasty surgery   Eosinophilic esophagitis    heartburn and reflux   Family history of adverse reaction to anesthesia    mother respiratory failure   Headache    Nasal fracture    deviated septum   Pneumonia    1x history of   Premature baby    2 months early   Seizures ([place])    well controlled on meds/ one 2 months ago when meds were late   Past Surgical History:  Procedure Laterality Date   ADENOIDECTOMY     CLOSED REDUCTION NASAL FRACTURE N/A 08/31/2017   Procedure: CLOSED REDUCTION NASAL FRACTURE;  Surgeon: Clyde Canterbury, MD;  Location: [place];  Service: ENT;  Laterality: N/A;   ORIF HUMERUS FRACTURE Right 05/05/2021   Procedure: reverse Hill-Sachs allografting, biceps tenodesis;  Surgeon: [name], MD;  Location: [place];  Service: Orthopedics;  Laterality: Right;   SEPTOPLASTY N/A 08/31/2017   Procedure: SEPTOPLASTY;  Surgeon: Clyde Canterbury, MD;  Location: [place];  Service: ENT;  Laterality: N/A;   SHOULDER ARTHROSCOPY WITH LABRAL REPAIR Left 04/30/2020   Procedure: LEFT SHOULDER POSTERIOR LABRAL REPAIR WITH ARTHROSCOPY, BICEPS TENDON RELEASE AND TENODESIS, OPEN ALLOGRAFT FOR REVERSE BANKART LESION;  Surgeon: [name], MD;  Location: [place];  Service: Orthopedics;  Laterality: Left;   SHOULDER ARTHROSCOPY WITH LABRAL REPAIR Right 05/05/2021   Procedure: right shoulder arthroscopy, biceps release, posterior labral repair;;  Surgeon: [name], MD;  Location: [place];  Service: Orthopedics;  Laterality: Right;   TONSILLECTOMY     and addenoids    reports that he has never smoked. He has never used smokeless tobacco. He reports current alcohol use. He reports that he does not use drugs. family history includes Cancer in his mother; Protein S deficiency in his mother. Allergies  Allergen Reactions   Lortab [Hydrocodone-Acetaminophen] Hives   Other Anaphylaxis    Peanuts   Zofran [Ondansetron Hcl] Nausea And Vomiting   Citrus Other (See Comments)    Sores in mouth   Dairy Aid [Tilactase] Nausea And Vomiting   Influenza Virus Vaccine Other (See Comments)    Partial paralysis for a  couple of weeks per mom   Soy Allergy Other (See Comments)    Seizures    Hydrocodone-Acetaminophen Rash  ' Review of Systems  Constitutional:  Positive for malaise/fatigue. Negative for chills, fever and weight loss.  Respiratory:  Negative for cough.   Cardiovascular:  Negative for chest pain.  Gastrointestinal:  Negative for abdominal pain, diarrhea, nausea and vomiting.  Genitourinary:  Negative for dysuria and hematuria.  Neurological:  Negative for focal weakness and headaches.      Objective:     BP 106/68 (BP Location: Left Arm, Patient Position: Sitting, Cuff Size: Normal)   Pulse  (!) 55   Temp (!) 97.5 F (36.4 C) (Oral)   Ht 6' (1.829 m)   Wt 125 lb 9.6 oz (57 kg)   SpO2 100%   BMI 17.03 kg/m    Physical Exam Vitals reviewed.  Constitutional:      Appearance: Normal appearance.  Cardiovascular:     Rate and Rhythm: Normal rate and regular rhythm.     Heart sounds: No murmur heard. Pulmonary:     Effort: Pulmonary effort is normal.     Breath sounds: Normal breath sounds. No wheezing or rales.  Musculoskeletal:     Cervical back: Neck supple.     Right lower leg: No edema.     Left lower leg: No edema.  Lymphadenopathy:     Cervical: No cervical adenopathy.  Neurological:     General: No focal deficit present.     Mental Status: He is alert.      No results found for any visits on 02/20/22.    The ASCVD Risk score (Arnett DK, et al., 2019) failed to calculate for the following reasons:   The 2019 ASCVD risk score is only valid for ages 40 to 58    Assessment & Plan:   #1 fatigue.  Suspect largely related to very poor sleep.  Usually only getting 3 to 4 hours at night.  He did recently start taking some over-the-counter Benadryl which seems to be helping some.  -Try to establish better sleep with minimum of 7 hours or more sleep at night -Check electrolytes with recent hypokalemia -Recheck B12 level with severe B12 deficiency previously -Check TSH  #2 seizure disorder currently treated with Keppra and Topamax.  Continue close follow-up with neurology  #3 insomnia.  Sleep hygiene discussed.  Taking Benadryl as above.  Patient also takes Klonopin at baseline 0.5 mg nightly   No follow-ups on file.    Carolann Littler, MD

## 2022-02-21 ENCOUNTER — Encounter: Payer: Self-pay | Admitting: Family Medicine

## 2022-02-26 ENCOUNTER — Other Ambulatory Visit: Payer: Self-pay

## 2022-02-26 ENCOUNTER — Emergency Department (HOSPITAL_COMMUNITY)
Admission: EM | Admit: 2022-02-26 | Discharge: 2022-02-26 | Disposition: A | Discharge status: Home / Self Care | Payer: BC Managed Care – PPO | Attending: Emergency Medicine | Admitting: Emergency Medicine

## 2022-02-26 DIAGNOSIS — Z91010 Allergy to peanuts: Secondary | ICD-10-CM | POA: Insufficient documentation

## 2022-02-26 DIAGNOSIS — T7840XA Allergy, unspecified, initial encounter: Secondary | ICD-10-CM | POA: Diagnosis not present

## 2022-02-26 DIAGNOSIS — L299 Pruritus, unspecified: Secondary | ICD-10-CM | POA: Diagnosis not present

## 2022-02-26 MED ORDER — PREDNISONE 20 MG PO TABS: ORAL_TABLET | ORAL | 0 refills | Status: DC

## 2022-02-26 MED ORDER — FAMOTIDINE IN NACL 20-0.9 MG/50ML-% IV SOLN
20.0000 mg | INTRAVENOUS | Status: AC
  Administered 2022-02-26: 20 mg via INTRAVENOUS
  Filled 2022-02-26: qty 50

## 2022-02-26 MED ORDER — METHYLPREDNISOLONE SODIUM SUCC 125 MG IJ SOLR
125.0000 mg | Freq: Once | INTRAMUSCULAR | Status: AC
  Administered 2022-02-26: 125 mg via INTRAVENOUS
  Filled 2022-02-26: qty 2

## 2022-02-26 NOTE — Discharge Instructions (Signed)
Take the prescribed medication as directed.  Can take benadryl with this-- '25mg'$  every 6-8 hours.  Can make you sleepy so be careful with benadryl. Keep your epi pen with you at all times. Follow-up with your primary care doctor. Return to the ED for new or worsening symptoms.

## 2022-02-26 NOTE — ED Provider Notes (Signed)
[facility] DEPT Provider Note   CSN: [id number] Arrival date & time: 02/26/22  0400     History  Chief Complaint  Patient presents with   Allergic Reaction    Terrence Riley is a 21 y.o. male.  The history is provided by the patient and medical records.  Allergic Reaction  21 y.o. M presenting to the ED with allergic reaction.  States he ate a watchamacallit candy bar approx 30 mins PTA, did not realize it contained peanuts and he has a known peanut allergy.  States he only took 1 bite.  He was given '50mg'$  benadryl by EMS with improvement.  Still feels "odd sensation" in his throat but no difficulty swallowing or SOB.  Reports some nausea.  No vomiting.  No chest pain.    Home Medications Prior to Admission medications   Medication Sig Start Date End Date Taking? Authorizing Provider  celecoxib (CELEBREX) 100 MG capsule Take 1 capsule (100 mg total) by mouth 2 (two) times daily. Patient taking differently: Take 100 mg by mouth 2 (two) times daily as needed for moderate pain. 11/19/21   Burchette, Alinda Sierras, MD  clonazePAM (KLONOPIN) 0.5 MG tablet Take 1 tablet (0.5 mg total) by mouth 2 (two) times daily. Patient taking differently: Take 0.5 mg by mouth at bedtime. 09/18/21   Cameron Sprang, MD  cyanocobalamin (,VITAMIN B-12,) 1000 MCG/ML injection Inject 1 mL (1,000 mcg total) into the muscle every 30 (thirty) days. 06/27/21   [name], MD  diphenhydrAMINE (BENADRYL) 25 MG tablet Take 25 mg by mouth at bedtime.    [provider]  EPINEPHrine 0.3 mg/0.3 mL IJ SOAJ injection Inject 0.3 mg into the muscle as needed for anaphylaxis. 07/10/20   McDonald, Mia A, PA-C  FLUoxetine (PROZAC) 20 MG capsule Take 1 capsule (20 mg total) by mouth daily. 07/25/21   Burchette, Alinda Sierras, MD  KEPPRA 1000 MG tablet Take 2 tablets (2,000 mg total) by mouth 2 (two) times daily. Patient taking differently: Take 2,000 mg by mouth at bedtime. 09/18/21   Cameron Sprang, MD  promethazine (PHENERGAN) 12.5 MG suppository Place 1 suppository (12.5 mg total) rectally every 8 (eight) hours as needed for up to 4 days for nausea or vomiting. 11/18/21 11/22/21  Marcello Fennel, PA-C  topiramate (TOPAMAX) 200 MG tablet Take 1 tablet (200 mg total) by mouth 2 (two) times daily. Patient taking differently: Take 200 mg by mouth at bedtime. 09/18/21   Cameron Sprang, MD      Allergies    Lortab [hydrocodone-acetaminophen], Other, Zofran [name] hcl], Citrus, Dairy aid [tilactase], Influenza virus vaccine, Soy allergy, and Hydrocodone-acetaminophen    Review of Systems   Review of Systems  Constitutional:        Allergic rxn  All other systems reviewed and are negative.   Physical Exam Updated Vital Signs BP (!) 132/91 (BP Location: Right Arm)   Pulse (!) 57   Temp 97.9 F (36.6 C) (Oral)   Resp 18   Ht 6' (1.829 m)   Wt 58.1 kg   SpO2 100%   BMI 17.36 kg/m   Physical Exam Vitals and nursing note reviewed.  Constitutional:      Appearance: He is well-developed.  HENT:     Head: Normocephalic and atraumatic.     Mouth/Throat:     Comments: No lip/tongue swelling, handling secretions well, no stridor Eyes:     Conjunctiva/sclera: Conjunctivae normal.     Pupils:  Pupils are equal, round, and reactive to light.  Cardiovascular:     Rate and Rhythm: Normal rate and regular rhythm.     Heart sounds: Normal heart sounds.  Pulmonary:     Effort: Pulmonary effort is normal.     Breath sounds: Normal breath sounds. No wheezing or rhonchi.     Comments: Lungs CTAB, no distress Abdominal:     General: Bowel sounds are normal.     Palpations: Abdomen is soft.  Musculoskeletal:        General: Normal range of motion.     Cervical back: Normal range of motion.  Skin:    General: Skin is warm and dry.     Comments: No rash  Neurological:     Mental Status: He is alert and oriented to person, place, and time.     ED Results / Procedures  / Treatments   Labs (all labs ordered are listed, but only abnormal results are displayed) Labs Reviewed - No data to display  EKG None  Radiology No results found.  Procedures Procedures    Medications Ordered in ED Medications  methylPREDNISolone sodium succinate (SOLU-MEDROL) 125 mg/2 mL injection 125 mg (125 mg Intravenous Given 02/26/22 0422)  famotidine (PEPCID) IVPB 20 mg premix (0 mg Intravenous Stopped 02/26/22 0452)    ED Course/ Medical Decision Making/ A&P                           Medical Decision Making Risk Prescription drug management.   21 year old male presenting to the ED with allergic reaction.  He ate a candy bar that contained peanuts, does have known nut allergy.  He was given 50 mg oral Benadryl with EMS with improvement.  On arrival he has no lip or tongue swelling, handling secretions well, speech is normal with appropriate phonation, no stridor.  His lungs are clear without any wheezes or rhonchi.  He denies any shortness of breath.  No vomiting.  No signs of anaphylaxis at this time.  As he is already had Benadryl, will give dose of Solu-Medrol and Pepcid.  We will monitor.  5:58 AM Monitored here for 2+ hours, no acute events.  Remains without airway compromise.  VSS.  Remains without signs of true anaphylaxis.  Stable for discharge.  He does have epi pen already at home, will continue prednisone taper for a few days with benadryl PRN.  Encouraged to follow-up with PCP.  Work note given.  Can return here for new concerns.  Final Clinical Impression(s) / ED Diagnoses Final diagnoses:  Allergic reaction, initial encounter    Rx / DC Orders ED Discharge Orders          Ordered    predniSONE (DELTASONE) 20 MG tablet        02/26/22 0601              Larene Pickett, PA-C 02/26/22 1610    Maudie Flakes, MD 02/26/22 [phone]

## 2022-02-26 NOTE — ED Triage Notes (Signed)
Pt ate a [employer] candy bar and it contained peanuts.  Pt is allergic to peanuts and felt throat was closing.  Pt was given 50 Po Benadryl by EMS and feels better but does feel like something in throat.

## 2022-02-28 ENCOUNTER — Emergency Department (HOSPITAL_COMMUNITY)
Admission: EM | Admit: 2022-02-28 | Discharge: 2022-02-28 | Discharge status: Against Medical Advice | Payer: BC Managed Care – PPO

## 2022-03-01 ENCOUNTER — Emergency Department (HOSPITAL_COMMUNITY): Payer: BC Managed Care – PPO

## 2022-03-01 ENCOUNTER — Encounter (HOSPITAL_COMMUNITY): Payer: Self-pay

## 2022-03-01 ENCOUNTER — Emergency Department (HOSPITAL_COMMUNITY)
Admission: EM | Admit: 2022-03-01 | Discharge: 2022-03-01 | Disposition: A | Discharge status: Home / Self Care | Payer: BC Managed Care – PPO | Attending: Emergency Medicine | Admitting: Emergency Medicine

## 2022-03-01 ENCOUNTER — Other Ambulatory Visit: Payer: Self-pay

## 2022-03-01 ENCOUNTER — Emergency Department (HOSPITAL_COMMUNITY)
Admission: EM | Admit: 2022-03-01 | Discharge: 2022-03-01 | Discharge status: Against Medical Advice | Payer: BC Managed Care – PPO | Source: Home / Self Care

## 2022-03-01 DIAGNOSIS — R519 Headache, unspecified: Secondary | ICD-10-CM | POA: Insufficient documentation

## 2022-03-01 DIAGNOSIS — Z5321 Procedure and treatment not carried out due to patient leaving prior to being seen by health care provider: Secondary | ICD-10-CM | POA: Insufficient documentation

## 2022-03-01 DIAGNOSIS — R110 Nausea: Secondary | ICD-10-CM | POA: Insufficient documentation

## 2022-03-01 DIAGNOSIS — E876 Hypokalemia: Secondary | ICD-10-CM | POA: Diagnosis not present

## 2022-03-01 DIAGNOSIS — R079 Chest pain, unspecified: Secondary | ICD-10-CM | POA: Diagnosis not present

## 2022-03-01 DIAGNOSIS — T424X1A Poisoning by benzodiazepines, accidental (unintentional), initial encounter: Secondary | ICD-10-CM | POA: Diagnosis not present

## 2022-03-01 DIAGNOSIS — R059 Cough, unspecified: Secondary | ICD-10-CM | POA: Insufficient documentation

## 2022-03-01 DIAGNOSIS — R0789 Other chest pain: Secondary | ICD-10-CM

## 2022-03-01 DIAGNOSIS — R42 Dizziness and giddiness: Secondary | ICD-10-CM | POA: Insufficient documentation

## 2022-03-01 DIAGNOSIS — T450X1A Poisoning by antiallergic and antiemetic drugs, accidental (unintentional), initial encounter: Secondary | ICD-10-CM | POA: Insufficient documentation

## 2022-03-01 DIAGNOSIS — I10 Essential (primary) hypertension: Secondary | ICD-10-CM | POA: Diagnosis not present

## 2022-03-01 DIAGNOSIS — T50901A Poisoning by unspecified drugs, medicaments and biological substances, accidental (unintentional), initial encounter: Secondary | ICD-10-CM

## 2022-03-01 LAB — TROPONIN I (HIGH SENSITIVITY)
Troponin I (High Sensitivity): <2 ng/L (ref <18)
Troponin I (High Sensitivity): <2 ng/L (ref <18)
Troponin I (High Sensitivity): 2 ng/L (ref <18)

## 2022-03-01 LAB — BASIC METABOLIC PANEL
Anion gap: 8 (ref 5–15)
Anion gap: 7 (ref 5–15)
BUN: 15 mg/dL (ref 6–20)
BUN: 14 mg/dL (ref 6–20)
CO2: 23 mmol/L (ref 22–32)
CO2: 21 mmol/L — ABNORMAL LOW (ref 22–32)
Calcium: 9.6 mg/dL (ref 8.9–10.3)
Calcium: 9.7 mg/dL (ref 8.9–10.3)
Chloride: 109 mmol/L (ref 98–111)
Chloride: 115 mmol/L — ABNORMAL HIGH (ref 98–111)
Creatinine, Ser: 1.27 mg/dL — ABNORMAL HIGH (ref 0.61–1.24)
Creatinine, Ser: 1.44 mg/dL — ABNORMAL HIGH (ref 0.61–1.24)
GFR, Estimated: >60 mL/min (ref >60)
GFR, Estimated: >60 mL/min (ref >60)
Glucose, Bld: 85 mg/dL (ref 70–99)
Glucose, Bld: 85 mg/dL (ref 70–99)
Potassium: 3.4 mmol/L — ABNORMAL LOW (ref 3.5–5.1)
Potassium: 3.6 mmol/L (ref 3.5–5.1)
Sodium: 140 mmol/L (ref 135–145)
Sodium: 143 mmol/L (ref 135–145)

## 2022-03-01 LAB — CBC WITH DIFFERENTIAL/PLATELET
Abs Immature Granulocytes: 0.01 10*3/uL (ref 0.00–0.07)
Basophils Absolute: 0.1 10*3/uL (ref 0.0–0.1)
Basophils Relative: 2 %
Eosinophils Absolute: 0.2 10*3/uL (ref 0.0–0.5)
Eosinophils Relative: 5 %
HCT: 44 % (ref 39.0–52.0)
Hemoglobin: 15 g/dL (ref 13.0–17.0)
Immature Granulocytes: 0 %
Lymphocytes Relative: 58 %
Lymphs Abs: 2.3 10*3/uL (ref 0.7–4.0)
MCH: 29.5 pg (ref 26.0–34.0)
MCHC: 34.1 g/dL (ref 30.0–36.0)
MCV: 86.6 fL (ref 80.0–100.0)
Monocytes Absolute: 0.3 10*3/uL (ref 0.1–1.0)
Monocytes Relative: 7 %
Neutro Abs: 1.1 10*3/uL — ABNORMAL LOW (ref 1.7–7.7)
Neutrophils Relative %: 28 %
Platelets: 178 10*3/uL (ref 150–400)
RBC: 5.08 MIL/uL (ref 4.22–5.81)
RDW: 12.6 % (ref 11.5–15.5)
WBC: 4 10*3/uL (ref 4.0–10.5)
nRBC: 0 % (ref 0.0–0.2)

## 2022-03-01 LAB — CBC
HCT: 43.5 % (ref 39.0–52.0)
Hemoglobin: 14.9 g/dL (ref 13.0–17.0)
MCH: 29.3 pg (ref 26.0–34.0)
MCHC: 34.3 g/dL (ref 30.0–36.0)
MCV: 85.5 fL (ref 80.0–100.0)
Platelets: 195 10*3/uL (ref 150–400)
RBC: 5.09 MIL/uL (ref 4.22–5.81)
RDW: 12.5 % (ref 11.5–15.5)
WBC: 3.6 10*3/uL — ABNORMAL LOW (ref 4.0–10.5)
nRBC: 0 % (ref 0.0–0.2)

## 2022-03-01 LAB — ETHANOL: Alcohol, Ethyl (B): <10 mg/dL (ref <10)

## 2022-03-01 NOTE — ED Triage Notes (Signed)
[facility]. Pt sts he accidentally double-dosed his keppra, topamax, clonopin, and benadryl. C/o dizziness, cp, abd pain, headache, tinnitus. Gcs 15. Aox3. Vss. Nadn.

## 2022-03-01 NOTE — ED Provider Notes (Signed)
  Physical Exam  BP (!) 124/91   Pulse (!) 59   Temp 98.1 F (36.7 C) (Oral)   Resp (!) 24   Ht 6' (1.829 m)   Wt 59 kg   SpO2 100%   BMI 17.63 kg/m   Physical Exam Vitals and nursing note reviewed.  Constitutional:      General: He is not in acute distress.    Appearance: He is not toxic-appearing.  HENT:     Head: Normocephalic and atraumatic.  Pulmonary:     Effort: No respiratory distress.  Skin:    Coloration: Skin is not jaundiced or pale.  Neurological:     Mental Status: He is alert and oriented to person, place, and time.  Psychiatric:        Behavior: Behavior normal.     Procedures  Procedures  ED Course / MDM    Medical Decision Making Amount and/or Complexity of Data Reviewed Labs: ordered. Radiology: ordered.   Patient signed out to me at shift change.  Please see previous provider note for further details.  In short, this is a 21 year old male who presents after accidentally ingesting excess number of his home medications.  The patient denies any SI/HI.  Patient was signed out to me pending observation until 8:40 AM.  The patient remained in the unit till 8:40 AM, denied any symptoms.  Patient showed the ability to ambulate with a steady gait to the bathroom and back.  The patient orthostatics were checked and he is not orthostatic at this time.  Patient we discharged home and advised to follow-up with PCP.  Patient encouraged to create medication schedule where he is able to check off whether or not he is taking his medications for the night.  Patient voices understanding.  Patient was provided with return precautions and he voiced understanding.  Patient had all of his questions answered to his satisfaction.  Patient stable for discharge home.       Azucena Cecil, PA-C 03/01/22 2536    Godfrey Pick, MD 03/02/22 [phone]

## 2022-03-01 NOTE — ED Notes (Signed)
Per poison control:  6hrs obs time from last ingestion Monitor for sedation, hypotension Ekg and trop for cp Orthostatics before discharge

## 2022-03-01 NOTE — ED Provider Notes (Signed)
[facility] DEPT Provider Note   CSN: [id number] Arrival date & time: 03/01/22  0500     History  No chief complaint on file.   Terrence Riley is a 21 y.o. male.  HPI  Medical history including seizures, presents because he took too much of his medications.  Patient states that he takes his medications every 12 hours, once at 9 AM and then again at 9 PM, he states that he took his usual dose At 9 PM yesterday but then at 240 AM this morning he took another dose of all of his medications(Klonopin 0.'5mg'$ , Keppra '2000mg'$ , Topamax '200mg'$  and Benadryl 50 mg) he states that he forgot that he took his previous dose.  He states that this was by accident, no suicidal homicidal ideations.  He states after taking it he feels lightheaded and dizzy, and has some slight chest pain no shortness of breath, no pleuritic chest pain   Denies any change in vision paresthesias or weakness of upper or lower extremities.  He has no other complaints.    Home Medications Prior to Admission medications   Medication Sig Start Date End Date Taking? Authorizing Provider  celecoxib (CELEBREX) 100 MG capsule Take 1 capsule (100 mg total) by mouth 2 (two) times daily. Patient taking differently: Take 100 mg by mouth 2 (two) times daily as needed for moderate pain. 11/19/21   Burchette, Alinda Sierras, MD  clonazePAM (KLONOPIN) 0.5 MG tablet Take 1 tablet (0.5 mg total) by mouth 2 (two) times daily. Patient taking differently: Take 0.5 mg by mouth at bedtime. 09/18/21   Cameron Sprang, MD  cyanocobalamin (,VITAMIN B-12,) 1000 MCG/ML injection Inject 1 mL (1,000 mcg total) into the muscle every 30 (thirty) days. 06/27/21   [name], MD  diphenhydrAMINE (BENADRYL) 25 MG tablet Take 25 mg by mouth at bedtime.    [provider]  EPINEPHrine 0.3 mg/0.3 mL IJ SOAJ injection Inject 0.3 mg into the muscle as needed for anaphylaxis. 07/10/20   McDonald, Mia A, PA-C  FLUoxetine (PROZAC) 20  MG capsule Take 1 capsule (20 mg total) by mouth daily. Patient not taking: Reported on 02/26/2022 07/25/21   Eulas Post, MD  KEPPRA 1000 MG tablet Take 2 tablets (2,000 mg total) by mouth 2 (two) times daily. Patient taking differently: Take 2,000 mg by mouth at bedtime. 09/18/21   Cameron Sprang, MD  predniSONE (DELTASONE) 20 MG tablet Take 40 mg by mouth daily for 3 days, then '20mg'$  by mouth daily for 3 days, then '10mg'$  daily for 3 days 02/26/22   Larene Pickett, PA-C  promethazine (PHENERGAN) 12.5 MG suppository Place 1 suppository (12.5 mg total) rectally every 8 (eight) hours as needed for up to 4 days for nausea or vomiting. 11/18/21 02/27/23  Marcello Fennel, PA-C  topiramate (TOPAMAX) 200 MG tablet Take 1 tablet (200 mg total) by mouth 2 (two) times daily. Patient taking differently: Take 200 mg by mouth daily. 09/18/21   Cameron Sprang, MD      Allergies    Lortab [hydrocodone-acetaminophen], Other, Zofran [name] hcl], Citrus, Dairy aid [tilactase], Influenza virus vaccine, Soy allergy, and Hydrocodone-acetaminophen    Review of Systems   Review of Systems  Constitutional:  Negative for chills and fever.  Respiratory:  Negative for shortness of breath.   Cardiovascular:  Positive for chest pain.  Gastrointestinal:  Negative for abdominal pain.  Neurological:  Positive for dizziness and light-headedness. Negative for headaches.  Physical Exam Updated Vital Signs BP (!) 127/94 (BP Location: Left Arm)   Pulse 74   Temp 98.1 F (36.7 C) (Oral)   Resp 16   Ht 6' (1.829 m)   Wt 59 kg   SpO2 99%   BMI 17.63 kg/m  Physical Exam Vitals and nursing note reviewed.  Constitutional:      General: He is not in acute distress.    Appearance: He is not ill-appearing.  HENT:     Head: Normocephalic and atraumatic.     Nose: No congestion.  Eyes:     Conjunctiva/sclera: Conjunctivae normal.  Cardiovascular:     Rate and Rhythm: Normal rate and regular rhythm.      Pulses: Normal pulses.     Heart sounds: No murmur heard.    No friction rub. No gallop.  Pulmonary:     Effort: No respiratory distress.     Breath sounds: No wheezing, rhonchi or rales.  Skin:    General: Skin is warm and dry.  Neurological:     Mental Status: He is alert.     GCS: GCS eye subscore is 4. GCS verbal subscore is 5. GCS motor subscore is 6.     Cranial Nerves: Cranial nerves 2-12 are intact.     Motor: No weakness.     Coordination: Romberg sign negative. Finger-Nose-Finger Test normal.     Gait: Gait is intact.     Comments: Cranial nerves II through XII grossly intact no difficulty with word finding following two-step commands no unilateral weakness present.  Gait fully intact.    Psychiatric:        Mood and Affect: Mood normal.     Comments: Slightly somnolent but easily arousable, responding appropriately, does not appear to be respond to internal stimuli, denies suicidal homicidal ideations.     ED Results / Procedures / Treatments   Labs (all labs ordered are listed, but only abnormal results are displayed) Labs Reviewed  BASIC METABOLIC PANEL - Abnormal; Notable for the following components:      Result Value   Potassium 3.4 (*)    Creatinine, Ser 1.27 (*)    All other components within normal limits  CBC WITH DIFFERENTIAL/PLATELET - Abnormal; Notable for the following components:   Neutro Abs 1.1 (*)    All other components within normal limits  ETHANOL  RAPID URINE DRUG SCREEN, HOSP PERFORMED  TROPONIN I (HIGH SENSITIVITY)    EKG EKG Interpretation  Date/Time:  Sunday March 01 2022 06:02:33 EDT Ventricular Rate:  66 PR Interval:  136 QRS Duration: 89 QT Interval:  423 QTC Calculation: 444 R Axis:   68 Text Interpretation: Sinus rhythm ST elev, probable normal early repol pattern Confirmed by Veryl Speak [phone]) on 03/01/2022 6:11:32 AM  Radiology DG Chest 2 View  Result Date: 03/01/2022 CLINICAL DATA:  Chest pain.  Complains of  dizziness. EXAM: CHEST - 2 VIEW COMPARISON:  CT chest 11/16/2021 FINDINGS: The heart size and mediastinal contours are within normal limits. Both lungs are clear. The visualized skeletal structures are unremarkable. IMPRESSION: No active cardiopulmonary disease. Electronically Signed   By: Kerby Moors M.D.   On: 03/01/2022 06:00    Procedures Procedures    Medications Ordered in ED Medications - No data to display  ED Course/ Medical Decision Making/ A&P                           Medical Decision Making Amount  and/or Complexity of Data Reviewed Labs: ordered. Radiology: ordered.   This patient presents to the ED for concern of overdose, this involves an extensive number of treatment options, and is a complaint that carries with it a high risk of complications and morbidity.  The differential diagnosis includes metabolic derailments, psychiatric emergency, ACS    Additional history obtained:  Additional history obtained from N/A External records from outside source obtained and reviewed including previous ED notes   Co morbidities that complicate the patient evaluation  N/A  Social Determinants of Health:  N/A    Lab Tests:  I Ordered, and personally interpreted labs.  The pertinent results include: CBC unremarkable, BMP shows potassium 3.4 creatinine 1.27 ethanol less than 10 for troponin is less than 2   Imaging Studies ordered:  I ordered imaging studies including chest x-ray I independently visualized and interpreted imaging which showed negative acute findings I agree with the radiologist interpretation   Cardiac Monitoring:  The patient was maintained on a cardiac monitor.  I personally viewed and interpreted the cardiac monitored which showed an underlying rhythm of: Without signs of ischemia   Medicines ordered and prescription drug management:  I ordered medication including N/A I have reviewed the patients home medicines and have made adjustments  as needed  Critical Interventions:  N/A   Reevaluation:  Presents after accidental overdose, benign physical exam, poison control recommends observation for 6 hours from ingestion, watch out for hypotension, somnolence, and work-up chest pain.    Consultations Obtained:  N/A    Test Considered:  N/A    Rule out Low suspicion for psychiatric emergency does not endorse suicidal homicidal ideations is not responding to internal stimuli.  Low suspicion for CVA no focal deficits present my exam.  Low suspicion for metabolic derailments as lab work is unremarkable.  Low suspicion for ACS presentation atypical low risk factors, EKG without signs of ischemia, first troponin is negative, second troponin is pending at this time.    Dispostion and problem list  Due to shift change patient be handed off to  [facility]  Follow-up on second troponin and finish observation at 840, as long as patient is back to his baseline can be discharged home.             Final Clinical Impression(s) / ED Diagnoses Final diagnoses:  Accidental overdose, initial encounter  Atypical chest pain    Rx / DC Orders ED Discharge Orders     None         Marcello Fennel, PA-C 03/01/22 4818    Veryl Speak, MD 03/02/22 0120

## 2022-03-01 NOTE — ED Triage Notes (Signed)
Patient is A&Ox4. Complaints of multiple things. He's not sure what is causing the dry mouth, or headache.New complaints of chest pain. EKG and blood work sent off.

## 2022-03-01 NOTE — ED Notes (Signed)
Pt ambulated to the restroom w/out need for assistance. Pt ambulated w/ steady gait.

## 2022-03-01 NOTE — Discharge Instructions (Signed)
Please return back to the ED with any new or worsening signs or symptoms Please follow-up with your PCP for further management Please attempt make medication schedule.  In this medication schedule, attempted to make boxes that you can check off whether or not you are taking her meds for the night to avoid repeat accidental ingestion.

## 2022-03-01 NOTE — ED Notes (Signed)
Pt states understanding of dc instructions, importance of follow up.  Pt denies questions or concerns upon dc. Pt declined wheelchair assistance upon dc. Pt ambulated out of ed w/ steady gait. No belongings left in room upon dc.  

## 2022-03-03 ENCOUNTER — Telehealth: Payer: Self-pay

## 2022-03-03 ENCOUNTER — Ambulatory Visit (INDEPENDENT_AMBULATORY_CARE_PROVIDER_SITE_OTHER): Payer: BC Managed Care – PPO

## 2022-03-03 ENCOUNTER — Emergency Department (HOSPITAL_COMMUNITY)
Admission: EM | Admit: 2022-03-03 | Discharge: 2022-03-03 | Discharge status: Against Medical Advice | Payer: No Typology Code available for payment source

## 2022-03-03 DIAGNOSIS — E538 Deficiency of other specified B group vitamins: Secondary | ICD-10-CM | POA: Diagnosis not present

## 2022-03-03 MED ORDER — CYANOCOBALAMIN 1000 MCG/ML IJ SOLN
1000.0000 ug | Freq: Once | INTRAMUSCULAR | Status: AC
  Administered 2022-03-03: 1000 ug via INTRAMUSCULAR

## 2022-03-03 NOTE — Telephone Encounter (Signed)
Patient informed of the message and verbalized understanding 

## 2022-03-03 NOTE — Telephone Encounter (Signed)
--  Caller states that he that he has been dizzy with blurred vision. It started four days ago. It is continuous. He also has headache and loss of appetite. No fever  03/03/2022 12:06:24 PM Go to ED Now (or PCP triage) D'Heur Lucia Gaskins, RN, Adrienne  Comments User: Adrienne, D'Heur Lucia Gaskins, RN Date/Time Eilene Ghazi Time): 03/03/2022 11:59:44 AM Caller states his left arm has been going numb & his left leg has been twitching for the past two days. No numbness currently.  User: Vincente Liberty, D'Heur Lucia Gaskins, RN Date/Time Eilene Ghazi Time): 03/03/2022 12:04:04 PM Caller states he has been in the ED three times and they can't find anything. He does not want to go again.  User: Vincente Liberty, D'Heur Lucia Gaskins, RN Date/Time Eilene Ghazi Time): 03/03/2022 12:06:23 PM Advised caller to reach out to his neurologist's office to see if they might be able to move his upcoming appointment up sooner. He c/o memory issues as well. He will not be making a fourth trip to ED.  Referrals GO TO FACILITY REFUSED  Pt has appt with PCP on 03/04/22; neurology appt 03/20/22

## 2022-03-03 NOTE — Progress Notes (Signed)
Pt here for monthly B12 injection per Dr Elease Hashimoto.  B12 1038mg given IM, and pt tolerated injection well.  Next B12 injection scheduled for next week.

## 2022-03-03 NOTE — ED Notes (Signed)
Patient left department prior to triage.  Patient called x 3 for triage with no answer.  Patient did not let staff know that he was leaving department

## 2022-03-04 ENCOUNTER — Encounter: Payer: Self-pay | Admitting: Family Medicine

## 2022-03-04 ENCOUNTER — Ambulatory Visit (INDEPENDENT_AMBULATORY_CARE_PROVIDER_SITE_OTHER): Payer: BC Managed Care – PPO | Admitting: Family Medicine

## 2022-03-04 VITALS — BP 96/60 | HR 65 | Temp 97.9°F | Ht 72.0 in | Wt 123.1 lb

## 2022-03-04 DIAGNOSIS — E538 Deficiency of other specified B group vitamins: Secondary | ICD-10-CM

## 2022-03-04 DIAGNOSIS — R1110 Vomiting, unspecified: Secondary | ICD-10-CM | POA: Diagnosis not present

## 2022-03-04 DIAGNOSIS — R634 Abnormal weight loss: Secondary | ICD-10-CM | POA: Diagnosis not present

## 2022-03-04 DIAGNOSIS — G40909 Epilepsy, unspecified, not intractable, without status epilepticus: Secondary | ICD-10-CM | POA: Diagnosis not present

## 2022-03-04 DIAGNOSIS — R5383 Other fatigue: Secondary | ICD-10-CM | POA: Diagnosis not present

## 2022-03-04 NOTE — Patient Instructions (Signed)
I will set up GI referral to further evaluate the recurrent vomiting.

## 2022-03-04 NOTE — Progress Notes (Unsigned)
Established Patient Office Visit  Subjective   Patient ID: Terrence Riley, male    DOB: [birth year]  Age: 21 y.o. MRN: [id number]  Chief Complaint  Patient presents with   Headache    Patient complains of chest pain, x5 days    Chest Pain    Patient complains of chest pain, Patient reports shortness of breath and slight difficulty breathing, x4 days    Anorexia    Patient complains of loss of appetite, x3 days    Numbness    Patient complains of numbnessin left arm and leg, x2 days     HPI  {History (Optional):23778} Patient here with multiple complaints today including headache bifrontally for about 4 days.  He has had decreased appetite and recurrent nausea and vomiting apparently somewhat for weeks.    He has history of reported migraine headaches.  Other medical problems include history of asthma, GERD, reported past history of eosinophilic esophagitis, seizure disorder, B12 deficiency.  Had prolonged admission last year with encephalopathy changes and etiology was never completely clear but felt to be possibly seizure related.  He is followed by neurology.  He actually went to the ER yesterday and apparently left before being seen.  Was seen here recently with complaints of profound weakness and we obtain repeat B12 level which was 148.  He has now had a couple of injections since then.  TSH was normal.  Multiple recent low potassium levels but most recent electrolytes here were normal.  Multiple recent labs in ER on the sixth including CBC which was normal.  Potassium 3.4.  Alcohol level negative, troponin negative  He has multiple vague complaints but these seem to center predominantly on fatigue, decreased appetite, recurrent episodes of vomiting.  No diarrhea.  Denies any abdominal pain currently.  We did upper GI small bowel follow-through last year which did not show any constricting lesions.  Reported history of eosinophilic esophagitis but no recent dysphagia.  Does have  some reported food allergies including peanuts and intolerance to citric acid and dairy products.  He avoids these.  Has had some recent gradual somewhat modest weight loss.  Current medications include clonazepam, fluoxetine, Keppra, Topamax.  Denies any recent obvious seizure activity.  Past Medical History:  Diagnosis Date   Allergy    rhinitis   Asthma    as a child   Complication of anesthesia    pt. reports he need more anesthesia with the septoplasty surgery   Eosinophilic esophagitis    heartburn and reflux   Family history of adverse reaction to anesthesia    mother respiratory failure   Headache    Nasal fracture    deviated septum   Pneumonia    1x history of   Premature baby    2 months early   Seizures ([place])    well controlled on meds/ one 2 months ago when meds were late   Past Surgical History:  Procedure Laterality Date   ADENOIDECTOMY     CLOSED REDUCTION NASAL FRACTURE N/A 08/31/2017   Procedure: CLOSED REDUCTION NASAL FRACTURE;  Surgeon: Clyde Canterbury, MD;  Location: [facility];  Service: ENT;  Laterality: N/A;   ORIF HUMERUS FRACTURE Right 05/05/2021   Procedure: reverse Hill-Sachs allografting, biceps tenodesis;  Surgeon: [name], MD;  Location: [facility];  Service: Orthopedics;  Laterality: Right;   SEPTOPLASTY N/A 08/31/2017   Procedure: SEPTOPLASTY;  Surgeon: Clyde Canterbury, MD;  Location: [facility];  Service: ENT;  Laterality: N/A;  SHOULDER ARTHROSCOPY WITH LABRAL REPAIR Left 04/30/2020   Procedure: LEFT SHOULDER POSTERIOR LABRAL REPAIR WITH ARTHROSCOPY, BICEPS TENDON RELEASE AND TENODESIS, OPEN ALLOGRAFT FOR REVERSE BANKART LESION;  Surgeon: [name], MD;  Location: [facility];  Service: Orthopedics;  Laterality: Left;   SHOULDER ARTHROSCOPY WITH LABRAL REPAIR Right 05/05/2021   Procedure: right shoulder arthroscopy, biceps release, posterior labral repair;;  Surgeon: [name], MD;  Location: [facility];  Service:  Orthopedics;  Laterality: Right;   TONSILLECTOMY     and addenoids    reports that he has never smoked. He has never used smokeless tobacco. He reports current alcohol use. He reports that he does not use drugs. family history includes Cancer in his mother; Protein S deficiency in his mother. Allergies  Allergen Reactions   Lortab [Hydrocodone-Acetaminophen] Hives   Other Anaphylaxis    Peanuts   Zofran [Ondansetron Hcl] Nausea And Vomiting   Citrus Other (See Comments)    Sores in mouth   Dairy Aid [Tilactase] Nausea And Vomiting   Influenza Virus Vaccine Other (See Comments)    Partial paralysis for a couple of weeks per mom   Soy Allergy Other (See Comments)    Seizures    Hydrocodone-Acetaminophen Rash     Review of Systems  Constitutional:  Positive for malaise/fatigue and weight loss. Negative for chills and fever.  Respiratory:  Negative for cough.   Cardiovascular:  Negative for leg swelling.  Gastrointestinal:  Positive for nausea and vomiting. Negative for blood in stool, constipation and diarrhea.  Genitourinary:  Negative for dysuria.  Neurological:  Positive for headaches. Negative for tremors, speech change and focal weakness.      Objective:     BP 96/60 (BP Location: Left Arm, Patient Position: Sitting, Cuff Size: Normal)   Pulse 65   Temp 97.9 F (36.6 C) (Oral)   Ht 6' (1.829 m)   Wt 123 lb 1.6 oz (55.8 kg)   SpO2 100%   BMI 16.70 kg/m  Wt Readings from Last 3 Encounters:  03/04/22 123 lb 1.6 oz (55.8 kg)  03/01/22 130 lb (59 kg)  02/26/22 128 lb (58.1 kg)      Physical Exam Vitals reviewed.  HENT:     Head: Normocephalic and atraumatic.  Cardiovascular:     Rate and Rhythm: Normal rate and regular rhythm.  Pulmonary:     Effort: Pulmonary effort is normal.     Breath sounds: Normal breath sounds.  Abdominal:     Palpations: Abdomen is soft. There is no mass.     Tenderness: There is no abdominal tenderness. There is no guarding.   Musculoskeletal:     Cervical back: Neck supple.  Neurological:     Cranial Nerves: No cranial nerve deficit.  Psychiatric:     Comments: Somewhat flat affect.  Answers questions appropriately.      No results found for any visits on 03/04/22.  {Labs (Optional):23779}  The ASCVD Risk score (Arnett DK, et al., 2019) failed to calculate for the following reasons:   The 2019 ASCVD risk score is only valid for ages 67 to 11    Assessment & Plan:   #1 patient relates several week if not month history of poor appetite with recurrent episodes of vomiting.  Does have some documented weight loss.  Etiology unclear.  Previous upper GI small bowel follow-through no constricting lesions.  Recommend setting up GI referral for further evaluation.  He does have history of intermittent hypokalemia which may be related  to his recent vomiting.  Denies active GERD symptoms.  #2 seizure disorder currently followed by neurology  #3 B12 deficiency.  Hospitalization back in November last year 1 intrinsic factor antibody normal and the other was elevated.  Recently started back on IM B12 replacement.  No chronic PPI use  #4 history of migraine headaches.  #5 reported history of eosinophilic esophagitis.  No follow-ups on file.    Carolann Littler, MD

## 2022-03-05 ENCOUNTER — Other Ambulatory Visit: Payer: Self-pay

## 2022-03-05 ENCOUNTER — Telehealth: Payer: Self-pay | Admitting: Family Medicine

## 2022-03-05 ENCOUNTER — Emergency Department (HOSPITAL_COMMUNITY): Payer: BC Managed Care – PPO

## 2022-03-05 ENCOUNTER — Emergency Department (HOSPITAL_COMMUNITY)
Admission: EM | Admit: 2022-03-05 | Discharge: 2022-03-05 | Discharge status: Against Medical Advice | Payer: BC Managed Care – PPO | Attending: Emergency Medicine | Admitting: Emergency Medicine

## 2022-03-05 DIAGNOSIS — R079 Chest pain, unspecified: Secondary | ICD-10-CM | POA: Diagnosis not present

## 2022-03-05 DIAGNOSIS — R1110 Vomiting, unspecified: Secondary | ICD-10-CM | POA: Diagnosis not present

## 2022-03-05 DIAGNOSIS — R519 Headache, unspecified: Secondary | ICD-10-CM | POA: Diagnosis not present

## 2022-03-05 DIAGNOSIS — R0902 Hypoxemia: Secondary | ICD-10-CM | POA: Diagnosis not present

## 2022-03-05 DIAGNOSIS — Z5321 Procedure and treatment not carried out due to patient leaving prior to being seen by health care provider: Secondary | ICD-10-CM | POA: Diagnosis not present

## 2022-03-05 LAB — COMPREHENSIVE METABOLIC PANEL
ALT: 27 U/L (ref 0–44)
AST: 25 U/L (ref 15–41)
Albumin: 4.6 g/dL (ref 3.5–5.0)
Alkaline Phosphatase: 79 U/L (ref 38–126)
Anion gap: 8 (ref 5–15)
BUN: 12 mg/dL (ref 6–20)
CO2: 22 mmol/L (ref 22–32)
Calcium: 9.3 mg/dL (ref 8.9–10.3)
Chloride: 111 mmol/L (ref 98–111)
Creatinine, Ser: 1.18 mg/dL (ref 0.61–1.24)
GFR, Estimated: >60 mL/min (ref >60)
Glucose, Bld: 83 mg/dL (ref 70–99)
Potassium: 3.6 mmol/L (ref 3.5–5.1)
Sodium: 141 mmol/L (ref 135–145)
Total Bilirubin: 0.5 mg/dL (ref 0.3–1.2)
Total Protein: 7.1 g/dL (ref 6.5–8.1)

## 2022-03-05 LAB — CBC WITH DIFFERENTIAL/PLATELET
Abs Immature Granulocytes: 0.01 10*3/uL (ref 0.00–0.07)
Basophils Absolute: 0.1 10*3/uL (ref 0.0–0.1)
Basophils Relative: 1 %
Eosinophils Absolute: 0.2 10*3/uL (ref 0.0–0.5)
Eosinophils Relative: 5 %
HCT: 44.7 % (ref 39.0–52.0)
Hemoglobin: 15.2 g/dL (ref 13.0–17.0)
Immature Granulocytes: 0 %
Lymphocytes Relative: 54 %
Lymphs Abs: 2.2 10*3/uL (ref 0.7–4.0)
MCH: 29.3 pg (ref 26.0–34.0)
MCHC: 34 g/dL (ref 30.0–36.0)
MCV: 86.3 fL (ref 80.0–100.0)
Monocytes Absolute: 0.3 10*3/uL (ref 0.1–1.0)
Monocytes Relative: 8 %
Neutro Abs: 1.3 10*3/uL — ABNORMAL LOW (ref 1.7–7.7)
Neutrophils Relative %: 32 %
Platelets: 178 10*3/uL (ref 150–400)
RBC: 5.18 MIL/uL (ref 4.22–5.81)
RDW: 12.4 % (ref 11.5–15.5)
WBC: 4.1 10*3/uL (ref 4.0–10.5)
nRBC: 0 % (ref 0.0–0.2)

## 2022-03-05 LAB — TROPONIN I (HIGH SENSITIVITY): Troponin I (High Sensitivity): 2 ng/L (ref <18)

## 2022-03-05 LAB — LIPASE, BLOOD: Lipase: 26 U/L (ref 11–51)

## 2022-03-05 MED ORDER — METOCLOPRAMIDE HCL 5 MG/ML IJ SOLN: 10.0000 mg | INTRAMUSCULAR | Status: DC

## 2022-03-05 NOTE — Telephone Encounter (Signed)
-  Caller states she would like advice on how to keep her son comfortable, while waiting for a GI appt. Pt has been vomiting x 2 days. No fever or diarrhea.  03/05/2022 10:33:21 AM Go to ED Now (or PCP triage) Yes Carmon, RN, Langley Gauss  Comments User: Romeo Apple, RN Date/Time Terrence Riley Time): 03/05/2022 10:32:20 AM Unable to keep sz meds down.  Referrals GO TO FACILITY OTHER - SPECIFY  Will route to PCP. Of note: GI referral placed based on notes from [facility] 03/04/22.

## 2022-03-05 NOTE — Telephone Encounter (Signed)
Pt's mother called to inform MD that son was taken by ambulance to the hospital last night, throwing up uncontrollably, but was treated and released.  Mother would like to know how soon MD will give them a GI referral, as she would like Pt to be seen as soon as possible.  Olivia Mackie [phone]

## 2022-03-05 NOTE — ED Triage Notes (Signed)
Patient coming to ED for evaluation of chest pain and HA x "a couple of days."  Saw PCP earlier for same

## 2022-03-05 NOTE — Telephone Encounter (Signed)
ED notes from 03/05/22, patient is to f/u with PCP in a few days.    Can this referral be discuss at f/u?

## 2022-03-05 NOTE — ED Notes (Signed)
Pt left the facility 

## 2022-03-05 NOTE — ED Provider Triage Note (Signed)
Emergency Medicine Provider Triage Evaluation Note  Terrence Riley , a 21 y.o. male  was evaluated in triage.  Pt complains of 3-4 days of chest pain and headache. Symptoms constant, unchanged. Having constant pounding R upper chest pain with intermittent sharp pains in his L chest. TNTC episodes of vomiting today. Last BM 2 days ago. No fevers. Denies drug use, THC use. No known FHx of sudden cardiac death.  Review of Systems  Positive: As above Negative: As above  Physical Exam  BP 121/81 (BP Location: Left Arm)   Pulse (!) 58   Temp 98.2 F (36.8 C) (Oral)   Resp 16   Ht 6' (1.829 m)   Wt 54.4 kg   SpO2 100%   BMI 16.27 kg/m  Gen:   Awake, no distress   Resp:  Normal effort  MSK:   Moves extremities without difficulty  Other:  Lungs CTAB. Heart RRR.  Medical Decision Making  Medically screening exam initiated at 12:54 AM.  Appropriate orders placed.  [name] SANTANNA OLENIK was informed that the remainder of the evaluation will be completed by another provider, this initial triage assessment does not replace that evaluation, and the importance of remaining in the ED until their evaluation is complete.  Chest pain and headache x 3-4 days. Seen in the ED 4 days ago for similar complaints. Followed up with PCP in office today.  Antiemetics ordered. Pending labs, repeat CXR. EKG completed in triage.   Antonietta Breach, PA-C 03/05/22 [phone]

## 2022-03-10 ENCOUNTER — Ambulatory Visit (HOSPITAL_COMMUNITY)
Admission: RE | Admit: 2022-03-10 | Discharge: 2022-03-10 | Disposition: A | Discharge status: Home / Self Care | Payer: BC Managed Care – PPO | Attending: Psychiatry | Admitting: Psychiatry

## 2022-03-10 ENCOUNTER — Other Ambulatory Visit: Payer: Self-pay

## 2022-03-10 ENCOUNTER — Encounter (HOSPITAL_COMMUNITY): Payer: Self-pay

## 2022-03-10 ENCOUNTER — Emergency Department (HOSPITAL_COMMUNITY)
Admission: EM | Admit: 2022-03-10 | Discharge: 2022-03-10 | Disposition: A | Discharge status: Home / Self Care | Payer: BC Managed Care – PPO | Attending: Emergency Medicine | Admitting: Emergency Medicine

## 2022-03-10 DIAGNOSIS — S60812A Abrasion of left wrist, initial encounter: Secondary | ICD-10-CM | POA: Diagnosis not present

## 2022-03-10 DIAGNOSIS — S60819A Abrasion of unspecified wrist, initial encounter: Secondary | ICD-10-CM

## 2022-03-10 DIAGNOSIS — Y9371 Activity, boxing: Secondary | ICD-10-CM | POA: Diagnosis not present

## 2022-03-10 DIAGNOSIS — W260XXA Contact with knife, initial encounter: Secondary | ICD-10-CM | POA: Diagnosis not present

## 2022-03-10 DIAGNOSIS — S6992XA Unspecified injury of left wrist, hand and finger(s), initial encounter: Secondary | ICD-10-CM | POA: Diagnosis not present

## 2022-03-10 NOTE — ED Triage Notes (Signed)
Pt reports accidentally cutting his left wrist on silverware. Pt has lac to left wrist. Bleeding controlled at this time.

## 2022-03-10 NOTE — H&P (Cosign Needed Addendum)
Behavioral Health Medical Screening Exam  HPI: Terrence Riley is a 21 y.o. male who presents voluntarily as a walk-in to [facility] for complaint of hearing a male voice telling him to do "bad stuff" to himself all the time and SI.  Patient is a Ship broker at [employer].  He has multiple past medical history.  From chart review patient had multiple (14) ED visits since January till August 2023.  History of major depressive disorder noted on March 27, 2021, history of mental health problem on July 24, 2021, and history of hallucination noted on September 07, 2021.  Patient lives with his mother in her home in [place], [place].  Assessment: On assessment today, patient is seen face-to-face and examined in the screen room.  Appears calm and participating in the exam.  Chart reviewed and findings shared with the treatment team and consult with Dr. Dwyane Dee.  Alert and oriented to person, time, place, and situation.  Observe a dressing to the left lower arm, when questioned, patient reports he was very angry and he punched a box with some knives and had a laceration.  Maintained fair eye contact throughout the encounter speech is fluent but low in volume.  Presents with anxious, hopeless and worthless mood.  Affect appropriate and tearful.  Presents with goal directed coherent but linear thought process.  Thought content is logical. Judgment is fair, memory is fair, however insight is shallow.    Patient endorses suicidal thoughts without plans, however, when asked further, stated, I think about jumping over the bridge.  Endorsed auditory hallucination, stating I been hearing male voices telling me to do some bad stuff to myself for the past 7 years, however, I have not acted on these voices. Denies homicidal ideation, or visual hallucinations. Denies suicide attempt or self injurious behavior. Denies being followed by a  therapist or psychiatrist, denies family history of mental illness, denies taking any psychotropic drugs, at this time. Denies alcohol use, drug use, or tobacco use.  Denies history of abuse or trauma or access to firearms at home. Endorses symptoms of depression and characterizes them as crying spells, hopelessness, worthlessness, and guilt.  Collaboration Information: Patient's mother Venida Jarvis at [phone] called as per patient's request.  Ms. Johnnye Sima confirmed the above HPI on patient, added that patient has special abilities (of perceiving things) and also works in a group home.  Mother reports that patient has multiple medical problems regarding his GI system, which the PCP/ gastroenterologist is working on his malabsorption problems. Mother also reports that patient's car broke down, and he is upset and stress about this. Added that whenever patient is stressed, he endorses suicidal thoughts and hallucinations. Mother request patient to be discharge to home after seeing an MD at the [facility].  Disposition: Patient sent to [facility] emergency room for medical clearance regarding cut to his left hand.  [facility] room physician called and report given.  Safe transport called to transport patient to [facility] ED.  Patient left [facility] without any incidents.  [facility] made aware of above collaboration information with Ms. Hatcher.  Total Time spent with patient: 1 hour  Psychiatric Specialty Exam:  Presentation  General Appearance: Appropriate for Environment; Casual; Fairly Groomed  Eye Contact:Fair  Speech:Clear and Coherent; Normal Rate; Slow  Speech Volume:Normal  Handedness:Right  Mood and Affect  Mood:Anxious; Hopeless; Worthless  Affect:Appropriate; Tearful  Thought Process  Thought Processes:Coherent; Goal Directed;  Linear  Descriptions of Associations:Intact  Orientation:Full (Time, Place and Person)  Thought Content:Logical  History of  Schizophrenia/Schizoaffective disorder:No  Duration of Psychotic Symptoms:Greater than six months  Hallucinations:Hallucinations: Auditory Description of Auditory Hallucinations: Male voice saying "Do bad stuff to yourself," voices not stressful.  Ideas of Reference:None  Suicidal Thoughts:Suicidal Thoughts: Yes, Passive SI Passive Intent and/or Plan: With Intent; With Plan; With Access to Means  Homicidal Thoughts:Homicidal Thoughts: No  Sensorium  Memory:Immediate Fair; Recent Fair; Remote [place]  Executive Functions  Concentration:Good  Attention Span:Good  [place]  Language:Good  Psychomotor Activity  Psychomotor Activity:Psychomotor Activity: Normal  Assets  Assets:Communication Skills; Desire for Improvement; Financial Resources/Insurance; Housing; Physical Health  Sleep  Sleep:Sleep: Poor Number of Hours of Sleep: 0  Physical Exam: Physical Exam Vitals and nursing note reviewed.  Constitutional:      Appearance: Normal appearance.  HENT:     Head: Normocephalic.     Nose: Nose normal.     Mouth/Throat:     Mouth: Mucous membranes are moist.     Pharynx: Oropharynx is clear.  Eyes:     Conjunctiva/sclera: Conjunctivae normal.     Pupils: Pupils are equal, round, and reactive to light.  Cardiovascular:     Rate and Rhythm: Bradycardia present.     Comments: P 50 Pulmonary:     Effort: Pulmonary effort is normal.  Abdominal:     Palpations: Abdomen is soft.  Genitourinary:    Comments: Deferred Musculoskeletal:        General: Normal range of motion.     Cervical back: Normal range of motion.  Skin:    General: Skin is warm.  Neurological:     General: No focal deficit present.     Mental Status: He is alert and oriented to person, place, and time.  Psychiatric:        Behavior: Behavior normal.    Review of Systems  Constitutional: Negative.  Negative for chills and fever.   HENT: Negative.  Negative for hearing loss and tinnitus.   Eyes: Negative.  Negative for blurred vision and double vision.  Respiratory: Negative.  Negative for cough, sputum production, shortness of breath and wheezing.   Cardiovascular: Negative.  Negative for chest pain.  Gastrointestinal: Negative.  Negative for abdominal pain, constipation, diarrhea, heartburn, nausea and vomiting.  Genitourinary: Negative.  Negative for dysuria, frequency and urgency.  Musculoskeletal: Negative.  Negative for myalgias and neck pain.  Skin: Negative.  Negative for itching and rash.  Neurological: Negative.  Negative for dizziness and headaches.  Endo/Heme/Allergies: Negative.         Lortab [Hydrocodone-acetaminophen]      Hives   High   Allergy   04/02/2015               Other      Anaphylaxis   High   Allergy   01/16/2017         Peanuts      Zofran [Ondansetron Hcl]      Nausea And Vomiting   High      04/02/2015               Dairy Aid [Tilactase]      Nausea And Vomiting   Not Specified   Hypersensitivity   01/16/2017               Soy Allergy      Other (See Comments)   Not Specified  Unspecified   01/16/2017         Seizures       Hydrocodone-acetaminophen      Rash   Low      07/15/2012                                                                            Adverse Reactions/Drug Intolerances         Citrus      Other (See Comments)   Not Specified   Intolerance   01/16/2017         Sores in mouth      Influenza Virus Vaccine      Other (See Comments)   Not Specified   Intolerance   05/22/2016         Partial paralysis for a couple of weeks per mom    Psychiatric/Behavioral:  Positive for hallucinations and suicidal ideas. The patient has insomnia.    Blood pressure 116/78,  pulse (!) 50, temperature 98.1 F (36.7 C), temperature source Oral, resp. rate 18. There is no height or weight on file to calculate BMI.  Musculoskeletal: Strength & Muscle Tone: within normal limits Gait & Station: normal Patient leans: N/A  [place] Scale:  Flowsheet Row OP Visit from 03/10/2022 in [facility] ED from 03/05/2022 in [facility] DEPT ED from 03/01/2022 in [facility] DEPT  C-SSRS RISK CATEGORY Low Risk No Risk No Risk       Recommendations:  Based on my evaluation the patient appears to have an emergency medical condition for which I recommend the patient be transferred to the emergency department for medical clearance.  Laretta Bolster, FNP 03/10/2022, 9:55 AM

## 2022-03-10 NOTE — ED Provider Notes (Signed)
[facility] DEPT Provider Note   CSN: [id number] Arrival date & time: 03/10/22  1054     History  Chief Complaint  Patient presents with   Laceration    Terrence Riley is a 21 y.o. male.  21 year old male presents with laceration to the left wrist.  Patient states that he got mad, punched a box that contained silverware and this resulted in a cut to the left wrist.  Patient went to behavioral health urgent care who sent him to this ER for treatment.  He denies self-inflicted injury, thoughts of harming himself or others.  Last tetanus was less than 5 years ago.  Denies weakness or numbness in the hand.  No other injuries, complaints, concerns.       Home Medications Prior to Admission medications   Medication Sig Start Date End Date Taking? Authorizing Provider  celecoxib (CELEBREX) 100 MG capsule Take 1 capsule (100 mg total) by mouth 2 (two) times daily. Patient taking differently: Take 100 mg by mouth 2 (two) times daily as needed for moderate pain. 11/19/21   Burchette, Alinda Sierras, MD  clonazePAM (KLONOPIN) 0.5 MG tablet Take 1 tablet (0.5 mg total) by mouth 2 (two) times daily. Patient taking differently: Take 0.5 mg by mouth at bedtime. 09/18/21   Cameron Sprang, MD  cyanocobalamin (,VITAMIN B-12,) 1000 MCG/ML injection Inject 1 mL (1,000 mcg total) into the muscle every 30 (thirty) days. 06/27/21   [name], MD  diphenhydrAMINE (BENADRYL) 25 MG tablet Take 25 mg by mouth at bedtime.    [provider]  EPINEPHrine 0.3 mg/0.3 mL IJ SOAJ injection Inject 0.3 mg into the muscle as needed for anaphylaxis. 07/10/20   McDonald, Mia A, PA-C  FLUoxetine (PROZAC) 20 MG capsule Take 1 capsule (20 mg total) by mouth daily. 07/25/21   Burchette, Alinda Sierras, MD  KEPPRA 1000 MG tablet Take 2 tablets (2,000 mg total) by mouth 2 (two) times daily. Patient taking differently: Take 2,000 mg by mouth at bedtime. 09/18/21   Cameron Sprang, MD   predniSONE (DELTASONE) 20 MG tablet Take 40 mg by mouth daily for 3 days, then '20mg'$  by mouth daily for 3 days, then '10mg'$  daily for 3 days 02/26/22   Larene Pickett, PA-C  promethazine (PHENERGAN) 12.5 MG suppository Place 1 suppository (12.5 mg total) rectally every 8 (eight) hours as needed for up to 4 days for nausea or vomiting. 11/18/21 02/27/23  Marcello Fennel, PA-C  topiramate (TOPAMAX) 200 MG tablet Take 1 tablet (200 mg total) by mouth 2 (two) times daily. Patient taking differently: Take 200 mg by mouth daily. 09/18/21   Cameron Sprang, MD      Allergies    Lortab [hydrocodone-acetaminophen], Other, Zofran [name] hcl], Citrus, Dairy aid [tilactase], Influenza virus vaccine, Soy allergy, and Hydrocodone-acetaminophen    Review of Systems   Review of Systems Negative except as per HPI Physical Exam Updated Vital Signs BP 132/83 (BP Location: Right Arm)   Pulse (!) 47   Temp 98 F (36.7 C) (Oral)   Resp 16   SpO2 100%  Physical Exam Vitals and nursing note reviewed.  Constitutional:      General: He is not in acute distress.    Appearance: He is well-developed. He is not diaphoretic.  HENT:     Head: Normocephalic and atraumatic.  Cardiovascular:     Pulses: Normal pulses.  Pulmonary:     Effort: Pulmonary effort is normal.  Skin:  General: Skin is warm and dry.     Capillary Refill: Capillary refill takes less than 2 seconds.     Findings: No erythema or rash.     Comments: Superficial/minor abrasion to the anterior left wrist, not requiring closure, no active bleeding.  Normal range of motion, sensation intact, brisk capillary refill present.  Neurological:     Mental Status: He is alert and oriented to person, place, and time.     Sensory: No sensory deficit.     Motor: No weakness.  Psychiatric:        Behavior: Behavior normal.        Thought Content: Thought content does not include homicidal or suicidal ideation. Thought content does not include  homicidal or suicidal plan.     ED Results / Procedures / Treatments   Labs (all labs ordered are listed, but only abnormal results are displayed) Labs Reviewed - No data to display  EKG None  Radiology No results found.  Procedures Procedures    Medications Ordered in ED Medications - No data to display  ED Course/ Medical Decision Making/ A&P                           Medical Decision Making  21 year old male sent from behavioral health urgent care for concern for superficial wound to the left wrist.  Patient states that he got angry, punched a box containing knives which caused the superficial injury to his left wrist.  His tetanus is up-to-date. Patient specifically denies suicidal and homicidal ideation.  Review of note from behavioral health urgent care comments on auditory hallucinations.  Call to Garrison Columbus, NFP, who has seen the patient prior to sending to the ER and has spoken with the mom today.  Provider did not look at wound prior to sending to the ER, was concerned that he had blood dripping on his hand and needed this wound checked in the ER.  Mother has verified story as patient punched a box containing knives.  Patient is not thought to be danger to self or others at this time and is cleared by behavioral health for discharge at this time.        Final Clinical Impression(s) / ED Diagnoses Final diagnoses:  Abrasion, wrist w/o infection    Rx / DC Orders ED Discharge Orders     None         Tacy Learn, PA-C 03/10/22 1239    Wyvonnia Dusky, MD 03/10/22 1416

## 2022-03-13 ENCOUNTER — Ambulatory Visit (INDEPENDENT_AMBULATORY_CARE_PROVIDER_SITE_OTHER): Payer: BC Managed Care – PPO

## 2022-03-13 DIAGNOSIS — E538 Deficiency of other specified B group vitamins: Secondary | ICD-10-CM

## 2022-03-13 MED ORDER — CYANOCOBALAMIN 1000 MCG/ML IJ SOLN
1000.0000 ug | Freq: Once | INTRAMUSCULAR | Status: AC
  Administered 2022-03-13: 1000 ug via INTRAMUSCULAR

## 2022-03-13 NOTE — Progress Notes (Signed)
Per orders of Dr. Elease Hashimoto, injection of VitB 12 1036mg given by KEncarnacion Slates Patient tolerated injection well.  Next VitB12 injection is due next week.

## 2022-03-20 ENCOUNTER — Other Ambulatory Visit (INDEPENDENT_AMBULATORY_CARE_PROVIDER_SITE_OTHER): Payer: BC Managed Care – PPO

## 2022-03-20 ENCOUNTER — Telehealth: Payer: Self-pay | Admitting: Family Medicine

## 2022-03-20 ENCOUNTER — Encounter: Payer: Self-pay | Admitting: Neurology

## 2022-03-20 ENCOUNTER — Ambulatory Visit: Payer: BC Managed Care – PPO

## 2022-03-20 ENCOUNTER — Ambulatory Visit: Payer: BC Managed Care – PPO | Admitting: Neurology

## 2022-03-20 VITALS — BP 100/60 | HR 68 | Resp 18 | Ht 72.0 in | Wt 122.0 lb

## 2022-03-20 DIAGNOSIS — G40209 Localization-related (focal) (partial) symptomatic epilepsy and epileptic syndromes with complex partial seizures, not intractable, without status epilepticus: Secondary | ICD-10-CM

## 2022-03-20 MED ORDER — TOPIRAMATE ER 200 MG PO CAP24: ORAL_CAPSULE | ORAL | 11 refills | Status: DC

## 2022-03-20 MED ORDER — CLONAZEPAM 0.5 MG PO TABS
ORAL_TABLET | ORAL | 5 refills | Status: DC
Start: 2022-03-20 — End: 2022-08-03

## 2022-03-20 MED ORDER — KEPPRA 1000 MG PO TABS
2000.0000 mg | ORAL_TABLET | Freq: Two times a day (BID) | ORAL | 11 refills | Status: DC
Start: 2022-03-20 — End: 2022-04-15

## 2022-03-20 NOTE — Patient Instructions (Addendum)
Bloodwork for Keppra level and Topiramate level  2. We will switch to extended-release Topiramate so you are just taking it at bedtime: take 2 capsules every night  3. Continue Keppra '1000mg'$ : take 2 tablets twice a day  4. Continue clonazepam 0.'5mg'$ : take 1 tablet every night  5. As per [place] driving laws, no driving until 6 months seizure-free  6. Follow-up in 6 months, call for any changes   Seizure Precautions: 1. If medication has been prescribed for you to prevent seizures, take it exactly as directed.  Do not stop taking the medicine without talking to your doctor first, even if you have not had a seizure in a long time.   2. Avoid activities in which a seizure would cause danger to yourself or to others.  Don't operate dangerous machinery, swim alone, or climb in high or dangerous places, such as on ladders, roofs, or girders.  Do not drive unless your doctor says you may.  3. If you have any warning that you may have a seizure, lay down in a safe place where you can't hurt yourself.    4.  No driving for 6 months from last seizure, as per [facility].   Please refer to the following link on the [place] website for more information: [email or web address]   5.  Maintain good sleep hygiene. Avoid alcohol.  6.  Contact your doctor if you have any problems that may be related to the medicine you are taking.  7.  Call 911 and bring the patient back to the ED if:        A.  The seizure lasts longer than 5 minutes.       B.  The patient doesn't awaken shortly after the seizure  C.  The patient has new problems such as difficulty seeing, speaking or moving  D.  The patient was injured during the seizure  E.  The patient has a temperature over 102 F (39C)  F.  The patient vomited and now is having trouble breathing       Your provider has requested that you have labwork completed today. Please go to  [facility] ([id number]) on the second floor of this building before leaving the office today. You do not need to check in. If you are not called within 15 minutes please check with the front desk.
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
## 2022-03-20 NOTE — Telephone Encounter (Signed)
Pt states he was told to call the office if he hasnt heard GI specialist for referral [phone]. He has not heard

## 2022-03-24 ENCOUNTER — Encounter: Payer: Self-pay | Admitting: Gastroenterology

## 2022-03-24 NOTE — Telephone Encounter (Signed)
Oct 2nd @ 10:50 with Dr.Cunningham at [facility]

## 2022-03-26 LAB — LEVETIRACETAM LEVEL: Keppra (Levetiracetam): <2 ug/mL — ABNORMAL LOW

## 2022-03-26 LAB — TOPIRAMATE LEVEL: Topiramate Lvl: 1.8 ug/mL

## 2022-03-27 ENCOUNTER — Ambulatory Visit: Payer: BC Managed Care – PPO

## 2022-03-30 ENCOUNTER — Other Ambulatory Visit: Payer: Self-pay | Admitting: Surgical

## 2022-04-10 ENCOUNTER — Other Ambulatory Visit: Payer: Self-pay | Admitting: Neurology

## 2022-04-13 ENCOUNTER — Other Ambulatory Visit (HOSPITAL_COMMUNITY): Payer: Self-pay

## 2022-04-13 ENCOUNTER — Emergency Department (HOSPITAL_COMMUNITY)
Admission: EM | Admit: 2022-04-13 | Discharge: 2022-04-14 | Disposition: A | Discharge status: Home / Self Care | Payer: BC Managed Care – PPO | Attending: Emergency Medicine | Admitting: Emergency Medicine

## 2022-04-13 ENCOUNTER — Telehealth (HOSPITAL_COMMUNITY): Payer: Self-pay | Admitting: Pharmacy Technician

## 2022-04-13 ENCOUNTER — Encounter (HOSPITAL_COMMUNITY): Payer: Self-pay | Admitting: Emergency Medicine

## 2022-04-13 DIAGNOSIS — R519 Headache, unspecified: Secondary | ICD-10-CM | POA: Diagnosis not present

## 2022-04-13 DIAGNOSIS — Z20822 Contact with and (suspected) exposure to covid-19: Secondary | ICD-10-CM | POA: Diagnosis not present

## 2022-04-13 DIAGNOSIS — Z79899 Other long term (current) drug therapy: Secondary | ICD-10-CM | POA: Insufficient documentation

## 2022-04-13 DIAGNOSIS — J069 Acute upper respiratory infection, unspecified: Secondary | ICD-10-CM | POA: Diagnosis not present

## 2022-04-13 LAB — SARS CORONAVIRUS 2 BY RT PCR: SARS Coronavirus 2 by RT PCR: NEGATIVE

## 2022-04-13 NOTE — Telephone Encounter (Signed)
Patient Advocate Encounter   Received notification that prior authorization for Brand Name [employer] is required/requested.   PA submitted on 04/13/22 to [employer] via CoverMyMeds Key [id number] Status is pending

## 2022-04-13 NOTE — Telephone Encounter (Signed)
Patient Advocate Encounter  Received notification from [employer] that the request for prior authorization for Keppra has been denied.   Coverage is provided in situations when documentation has been provided to confirm  that the brand product is being requested due to a formulation difference in the  inactive ingredient(s) [Examples include: difference in dyes, fillers, preservatives]  between the brand and the bioequivalent generic product which, per the prescriber,  would result in a significant allergy or serious adverse reaction. Coverage cannot be  authorized at this time.   I sent notes that said the pt had more seizures when he was on generic, but it was still denied.   This determination is currently being reviewed for appeal.    Specialty Pharmacy Patient Advocate Fax:  [phone]

## 2022-04-13 NOTE — ED Triage Notes (Signed)
Pt states that a lot of people at his job have Tran[place]illity and he would like a test. Reports a headache and chills for the last few days. Also reports an episode of diarrhea 3 days ago. A&Ox4.

## 2022-04-14 NOTE — ED Provider Notes (Signed)
[place] DEPT Provider Note   CSN: [id number] Arrival date & time: 04/13/22  2154     History  Chief Complaint  Patient presents with   Headache    Terrence Riley is a 21 y.o. male.  Patient with no pertinent past medical history presents today requesting a covid test. He states that several people at his job have tested positive for COVID and therefore he requires a test to return to work. He states that he has had 2 days of cough and congestion as well as mild headaches, chills, and body aches.  No meds PTA.  Denies chest pain or shortness of breath. Does endorse a few bouts of diarrhea, but denies nausea or vomiting.  The history is provided by the patient. No language interpreter was used.  Headache Associated symptoms: congestion        Home Medications Prior to Admission medications   Medication Sig Start Date End Date Taking? Authorizing Provider  celecoxib (CELEBREX) 100 MG capsule Take 1 capsule (100 mg total) by mouth 2 (two) times daily. Patient taking differently: Take 100 mg by mouth 2 (two) times daily as needed for moderate pain. 11/19/21   Burchette, Alinda Sierras, MD  clonazePAM (KLONOPIN) 0.5 MG tablet Take 1 tablet every night 03/20/22   Cameron Sprang, MD  cyanocobalamin (,VITAMIN B-12,) 1000 MCG/ML injection Inject 1 mL (1,000 mcg total) into the muscle every 30 (thirty) days. 06/27/21   [name], MD  diphenhydrAMINE (BENADRYL) 25 MG tablet Take 25 mg by mouth at bedtime.    [provider]  EPINEPHrine 0.3 mg/0.3 mL IJ SOAJ injection Inject 0.3 mg into the muscle as needed for anaphylaxis. Patient not taking: Reported on 03/20/2022 07/10/20   McDonald, Maree Erie A, PA-C  FLUoxetine (PROZAC) 20 MG capsule Take 1 capsule (20 mg total) by mouth daily. 07/25/21   Burchette, Alinda Sierras, MD  KEPPRA 1000 MG tablet Take 2 tablets (2,000 mg total) by mouth 2 (two) times daily. 03/20/22   Cameron Sprang, MD  predniSONE (DELTASONE)  20 MG tablet Take 40 mg by mouth daily for 3 days, then '20mg'$  by mouth daily for 3 days, then '10mg'$  daily for 3 days Patient not taking: Reported on 03/20/2022 02/26/22   Larene Pickett, PA-C  promethazine (PHENERGAN) 12.5 MG suppository Place 1 suppository (12.5 mg total) rectally every 8 (eight) hours as needed for up to 4 days for nausea or vomiting. Patient not taking: Reported on 03/20/2022 11/18/21 02/27/23  Marcello Fennel, PA-C  Topiramate ER (TROKENDI XR) 200 MG CP24 Take 2 capsules every night 03/20/22   Cameron Sprang, MD      Allergies    Lortab [hydrocodone-acetaminophen], Other, Zofran [name] hcl], Citrus, Dairy aid [tilactase], Influenza virus vaccine, Soy allergy, and Hydrocodone-acetaminophen    Review of Systems   Review of Systems  HENT:  Positive for congestion.   Neurological:  Positive for headaches.  All other systems reviewed and are negative.   Physical Exam Updated Vital Signs BP 131/78 (BP Location: Left Arm)   Pulse 80   Temp 98.2 F (36.8 C) (Oral)   Resp 16   SpO2 100%  Physical Exam Vitals and nursing note reviewed.  Constitutional:      General: He is not in acute distress.    Appearance: Normal appearance. He is well-developed and normal weight. He is not ill-appearing, toxic-appearing or diaphoretic.  HENT:     Head: Normocephalic and atraumatic.  Eyes:  Extraocular Movements: Extraocular movements intact.     Pupils: Pupils are equal, round, and reactive to light.  Neck:     Meningeal: Brudzinski's sign and Kernig's sign absent.  Cardiovascular:     Rate and Rhythm: Normal rate and regular rhythm.     Heart sounds: Normal heart sounds.  Pulmonary:     Effort: Pulmonary effort is normal. No respiratory distress.     Breath sounds: Normal breath sounds.  Abdominal:     General: Abdomen is flat.     Palpations: Abdomen is soft.  Musculoskeletal:        General: Normal range of motion.     Cervical back: Normal range of motion and  neck supple.  Skin:    General: Skin is warm and dry.  Neurological:     General: No focal deficit present.     Mental Status: He is alert and oriented to person, place, and time.     GCS: GCS eye subscore is 4. GCS verbal subscore is 5. GCS motor subscore is 6.  Psychiatric:        Mood and Affect: Mood normal.        Behavior: Behavior normal.     ED Results / Procedures / Treatments   Labs (all labs ordered are listed, but only abnormal results are displayed) Labs Reviewed  SARS CORONAVIRUS 2 BY RT PCR    EKG None  Radiology No results found.  Procedures Procedures    Medications Ordered in ED Medications - No data to display  ED Course/ Medical Decision Making/ A&P                           Medical Decision Making  Patient presents today requesting a COVID test.  He is afebrile, nontoxic-appearing, no acute distress with reassuring vital signs.  COVID test has been obtained and is negative.  Given this, I did offer the patient further evaluation and fluids for his diarrhea.  He denied same, stating that he only came here for a COVID test to return to work.  He endorses a headache, however he states that it came on gradually and is mild in nature.  He denies any neck pain or photophobia.  No concern for [facility] or meningitis.  He also endorses some diarrhea but denies any abdominal pain.  Low suspicion for emergent or urgent acute intra-abdominal process at this time.  His lungs are clear to auscultation in all fields.  Therefore will defer chest x-ray at this time.  Since symptoms are consistent with a URI.  I have discussed with the patient as these are typically viral in nature no antibiotics are indicated.  I offered the patient symptomatic treatment which he declined.  Patient is stable for discharge at this time.  Educated on red flag symptoms that would prompt immediate return.  Patient is understanding and amenable with plan, discharged in stable condition.   Final  Clinical Impression(s) / ED Diagnoses Final diagnoses:  Viral URI    Rx / DC Orders ED Discharge Orders     None     An After Visit Summary was printed and given to the patient.     Nestor Lewandowsky 04/14/22 0130    Truddie Hidden, MD 04/14/22 [phone]

## 2022-04-14 NOTE — Discharge Instructions (Signed)
As we discussed, your COVID swab was negative today.  However, I suspect that you have an upper respiratory infection.  As these are almost always viral in nature, no antibiotics are indicated.  I recommend that you rest and drink plenty of fluids.  You may also take over-the-counter cough and cold medication as needed.  May also take Tylenol and ibuprofen as needed for fevers and body aches.  Follow-up with your primary care doctor as needed for continued evaluation and management of your symptoms  Return if development of any new or worsening symptoms.

## 2022-04-14 NOTE — ED Notes (Signed)
Pt reports hew is just here for a Covid test and he is not sure why he is still here now that he has had the covid test.

## 2022-04-14 NOTE — Telephone Encounter (Signed)
Received a form requesting more information. Filled out form and faxed it back, along with notes.  Faxed to [phone]

## 2022-04-15 MED ORDER — KEPPRA 1000 MG PO TABS: 2000.0000 mg | ORAL_TABLET | Freq: Two times a day (BID) | ORAL | 3 refills | Status: DC

## 2022-04-15 NOTE — Telephone Encounter (Signed)
Script faxed for pt to [facility] for name brand keppra with PA information

## 2022-04-15 NOTE — Telephone Encounter (Signed)
Received notification from [employer] regarding a prior authorization for  Keppra '1000mg'$  . Authorization has been APPROVED from 03/31/22 to 04/15/23.   Authorization # Key: [id number] - PA Case ID: [id number]

## 2022-04-16 ENCOUNTER — Other Ambulatory Visit: Payer: Self-pay

## 2022-04-16 MED ORDER — KEPPRA 1000 MG PO TABS: 2000.0000 mg | ORAL_TABLET | Freq: Two times a day (BID) | ORAL | 3 refills | Status: DC

## 2022-04-20 NOTE — Telephone Encounter (Signed)
Patient Advocate Encounter  Previous KEY timed out for delay in clinical questions. New PA started PA submitted and APPROVED on 04/20/2022.  Key [id number]  Effective: 03/31/2022 - 04/15/2023  Terrence Riley, CPhT Rx Patient Advocate Phone: [phone]

## 2022-04-27 ENCOUNTER — Encounter: Payer: Self-pay | Admitting: Gastroenterology

## 2022-04-27 ENCOUNTER — Other Ambulatory Visit (INDEPENDENT_AMBULATORY_CARE_PROVIDER_SITE_OTHER): Payer: BC Managed Care – PPO

## 2022-04-27 ENCOUNTER — Ambulatory Visit (INDEPENDENT_AMBULATORY_CARE_PROVIDER_SITE_OTHER): Payer: BC Managed Care – PPO | Admitting: Gastroenterology

## 2022-04-27 ENCOUNTER — Ambulatory Visit: Payer: BC Managed Care – PPO | Admitting: Orthopedic Surgery

## 2022-04-27 VITALS — BP 110/76 | HR 59 | Ht 72.0 in | Wt 128.0 lb

## 2022-04-27 DIAGNOSIS — E538 Deficiency of other specified B group vitamins: Secondary | ICD-10-CM

## 2022-04-27 DIAGNOSIS — K921 Melena: Secondary | ICD-10-CM | POA: Diagnosis not present

## 2022-04-27 DIAGNOSIS — R112 Nausea with vomiting, unspecified: Secondary | ICD-10-CM | POA: Diagnosis not present

## 2022-04-27 DIAGNOSIS — R636 Underweight: Secondary | ICD-10-CM | POA: Diagnosis not present

## 2022-04-27 DIAGNOSIS — K200 Eosinophilic esophagitis: Secondary | ICD-10-CM

## 2022-04-27 DIAGNOSIS — R109 Unspecified abdominal pain: Secondary | ICD-10-CM | POA: Diagnosis not present

## 2022-04-27 LAB — COMPREHENSIVE METABOLIC PANEL
ALT: 31 U/L (ref 0–53)
AST: 42 U/L — ABNORMAL HIGH (ref 0–37)
Albumin: 4.5 g/dL (ref 3.5–5.2)
Alkaline Phosphatase: 88 U/L (ref 39–117)
BUN: 10 mg/dL (ref 6–23)
CO2: 24 mEq/L (ref 19–32)
Calcium: 9.2 mg/dL (ref 8.4–10.5)
Chloride: 106 mEq/L (ref 96–112)
Creatinine, Ser: 1.07 mg/dL (ref 0.40–1.50)
GFR: 98.98 mL/min (ref >60.00)
Glucose, Bld: 85 mg/dL (ref 70–99)
Potassium: 3.7 mEq/L (ref 3.5–5.1)
Sodium: 138 mEq/L (ref 135–145)
Total Bilirubin: 0.3 mg/dL (ref 0.2–1.2)
Total Protein: 6.9 g/dL (ref 6.0–8.3)

## 2022-04-27 LAB — CBC WITH DIFFERENTIAL/PLATELET
Basophils Absolute: 0.1 10*3/uL (ref 0.0–0.1)
Basophils Relative: 3 % (ref 0.0–3.0)
Eosinophils Absolute: 0.3 10*3/uL (ref 0.0–0.7)
Eosinophils Relative: 8.9 % — ABNORMAL HIGH (ref 0.0–5.0)
HCT: 41.5 % (ref 39.0–52.0)
Hemoglobin: 14.2 g/dL (ref 13.0–17.0)
Lymphocytes Relative: 40.1 % (ref 12.0–46.0)
Lymphs Abs: 1.5 10*3/uL (ref 0.7–4.0)
MCHC: 34.3 g/dL (ref 30.0–36.0)
MCV: 86.7 fl (ref 78.0–100.0)
Monocytes Absolute: 0.4 10*3/uL (ref 0.1–1.0)
Monocytes Relative: 9.7 % (ref 3.0–12.0)
Neutro Abs: 1.5 10*3/uL (ref 1.4–7.7)
Neutrophils Relative %: 38.3 % — ABNORMAL LOW (ref 43.0–77.0)
Platelets: 149 10*3/uL — ABNORMAL LOW (ref 150.0–400.0)
RBC: 4.78 Mil/uL (ref 4.22–5.81)
RDW: 12.8 % (ref 11.5–15.5)
WBC: 3.8 10*3/uL — ABNORMAL LOW (ref 4.0–10.5)

## 2022-04-27 LAB — VITAMIN D 25 HYDROXY (VIT D DEFICIENCY, FRACTURES): VITD: 17.28 ng/mL — ABNORMAL LOW (ref 30.00–100.00)

## 2022-04-27 LAB — IBC + FERRITIN
Ferritin: 35.1 ng/mL (ref 22.0–322.0)
Iron: 44 ug/dL (ref 42–165)
Saturation Ratios: 13 % — ABNORMAL LOW (ref 20.0–50.0)
TIBC: 337.4 ug/dL (ref 250.0–450.0)
Transferrin: 241 mg/dL (ref 212.0–360.0)

## 2022-04-27 LAB — VITAMIN B12: Vitamin B-12: 240 pg/mL (ref 211–911)

## 2022-04-27 LAB — C-REACTIVE PROTEIN: CRP: <1 mg/dL (ref 0.5–20.0)

## 2022-04-27 LAB — FOLATE: Folate: 6.7 ng/mL (ref >5.9)

## 2022-04-27 MED ORDER — NA SULFATE-K SULFATE-MG SULF 17.5-3.13-1.6 GM/177ML PO SOLN: 1.0000 | Freq: Once | ORAL | 0 refills | Status: AC

## 2022-04-27 NOTE — Progress Notes (Signed)
HPI : Terrence Riley is a very pleasant 21 year old male with a history of seizure disorder who is referred to [place] by Dr. Carolann Littler for multiple chronic GI symptoms.  The patient is a very poor historian and his mother was called via telephone via the appointment to help provide history.  Of note, the patient is even a poor historian when it comes to his own symptoms, and his mother had to help feel when details on his symptoms. Per the patient's mother, the patient was diagnosed with eosinophilic esophagitis when he was only 21 years old.  His symptoms that led to the diagnosis at that time were vomiting and underweight.  He was treated with what sounds like a swallowed steroid suspension, and his symptoms improved.  He stopped taking meds about 8 to 9 years ago.  He has not been followed by a gastrologist since he was a small child. Patient reports having symptoms of abdominal pain, nausea and vomiting, decreased appetite, rectal pain with passage of stool, bright red blood per rectum and irregular bowel habits, characterized by bouts of diarrhea interspersed with infrequent solid bowel movements every few days.  He also reports atypical dysphagia symptoms described as his esophagus "moving" when he swallows.  He denies problems with food feeling like it is sitting in his chest needing to be washed down. The patient's mother states that she is very concerned that he is "not absorbing through his GI tract".  This concern is based on the fact that he is reportedly losing weight and from an episode last November where he was hospitalized for altered mental status and had undetectable levels of his antiseizure medications and his blood.  She thinks that his GI tract is not absorbing his seizure medicines.  He does have documented B12 deficiency going back to November 2022 which normalized with supplementation.  He had mildly elevated intrinsic factor antibodies at that time as well.  The patient does  not have chronic diarrhea, but he does have episodes of loose stools.  Most the time he has formed stools every few days.  Sometimes has straining.  He has seen blood in his stool for the past few months.  He is seeing blood with most bowel movements in the past few months.  Although he reported an unintentional weight loss of 7 pounds in the past month, review of his weights in the EMR indicates that his current weight is 128 pounds.  A month ago he weighed 122 pounds.  April 25, 2021 he weighed 128 pounds.  He has never had a colonoscopy.  He has not had any abdominal cross-sectional imaging.  He had a small bowel follow-through in January of this year to evaluate vomiting, decreased appetite and reported weight loss.  This study was normal, but visualization of the terminal ileum was suboptimal.  Past Medical History:  Diagnosis Date   Allergy    rhinitis   Asthma    as a child   Complication of anesthesia    pt. reports he need more anesthesia with the septoplasty surgery   Eosinophilic esophagitis    heartburn and reflux   Family history of adverse reaction to anesthesia    mother respiratory failure   Headache    Nasal fracture    deviated septum   Pneumonia    1x history of   Premature baby    2 months early   Seizures ([place])    well controlled on meds/ one 2 months ago when  meds were late     Past Surgical History:  Procedure Laterality Date   ADENOIDECTOMY     CLOSED REDUCTION NASAL FRACTURE N/A 08/31/2017   Procedure: CLOSED REDUCTION NASAL FRACTURE;  Surgeon: Clyde Canterbury, MD;  Location: [facility];  Service: ENT;  Laterality: N/A;   ORIF HUMERUS FRACTURE Right 05/05/2021   Procedure: reverse Hill-Sachs allografting, biceps tenodesis;  Surgeon: [name], MD;  Location: [facility];  Service: Orthopedics;  Laterality: Right;   SEPTOPLASTY N/A 08/31/2017   Procedure: SEPTOPLASTY;  Surgeon: Clyde Canterbury, MD;  Location: [facility];  Service: ENT;   Laterality: N/A;   SHOULDER ARTHROSCOPY WITH LABRAL REPAIR Left 04/30/2020   Procedure: LEFT SHOULDER POSTERIOR LABRAL REPAIR WITH ARTHROSCOPY, BICEPS TENDON RELEASE AND TENODESIS, OPEN ALLOGRAFT FOR REVERSE BANKART LESION;  Surgeon: [name], MD;  Location: [facility];  Service: Orthopedics;  Laterality: Left;   SHOULDER ARTHROSCOPY WITH LABRAL REPAIR Right 05/05/2021   Procedure: right shoulder arthroscopy, biceps release, posterior labral repair;;  Surgeon: [name], MD;  Location: [facility];  Service: Orthopedics;  Laterality: Right;   TONSILLECTOMY     and addenoids   Family History  Problem Relation Age of Onset   Cancer Mother        breat   Clotting disorder Mother    Protein C deficiency Sister    Stomach cancer Neg Hx    Esophageal cancer Neg Hx    Colon cancer Neg Hx    Social History   Tobacco Use   Smoking status: Never   Smokeless tobacco: Never  Vaping Use   Vaping Use: Never used  Substance Use Topics   Alcohol use: Not Currently   Drug use: No   Current Outpatient Medications  Medication Sig Dispense Refill   celecoxib (CELEBREX) 100 MG capsule Take 1 capsule (100 mg total) by mouth 2 (two) times daily. (Patient taking differently: Take 100 mg by mouth 2 (two) times daily as needed for moderate pain.) 30 capsule 0   clonazePAM (KLONOPIN) 0.5 MG tablet Take 1 tablet every night 30 tablet 5   cyanocobalamin (,VITAMIN B-12,) 1000 MCG/ML injection Inject 1 mL (1,000 mcg total) into the muscle every 30 (thirty) days. 1 mL 0   diphenhydrAMINE (BENADRYL) 25 MG tablet Take 25 mg by mouth at bedtime.     KEPPRA 1000 MG tablet Take 2 tablets (2,000 mg total) by mouth 2 (two) times daily. 120 tablet 3   Topiramate ER (TROKENDI XR) 200 MG CP24 Take 2 capsules every night 60 capsule 11   EPINEPHrine 0.3 mg/0.3 mL IJ SOAJ injection Inject 0.3 mg into the muscle as needed for anaphylaxis. (Patient not taking: Reported on 03/20/2022) 1 each 0   FLUoxetine (PROZAC) 20  MG capsule Take 1 capsule (20 mg total) by mouth daily. (Patient not taking: Reported on 04/27/2022) 30 capsule 3   predniSONE (DELTASONE) 20 MG tablet Take 40 mg by mouth daily for 3 days, then '20mg'$  by mouth daily for 3 days, then '10mg'$  daily for 3 days (Patient not taking: Reported on 03/20/2022) 12 tablet 0   promethazine (PHENERGAN) 12.5 MG suppository Place 1 suppository (12.5 mg total) rectally every 8 (eight) hours as needed for up to 4 days for nausea or vomiting. (Patient not taking: Reported on 03/20/2022) 12 suppository 0   No current facility-administered medications for this visit.   Allergies  Allergen Reactions   Lortab [Hydrocodone-Acetaminophen] Hives   Other Anaphylaxis    Peanuts   Zofran [  Ondansetron Hcl] Nausea And Vomiting   Citrus Other (See Comments)    Sores in mouth   Dairy Aid [Tilactase] Nausea And Vomiting   Influenza Virus Vaccine Other (See Comments)    Partial paralysis for a couple of weeks per mom   Soy Allergy Other (See Comments)    Seizures    Hydrocodone-Acetaminophen Rash     Review of Systems: All systems reviewed and negative except where noted in HPI.    No results found.  Physical Exam: BP 110/76   Pulse (!) 59   Ht 6' (1.829 m)   Wt 128 lb (58.1 kg)   BMI 17.36 kg/m  Constitutional: Pleasant,well-developed, thin African-American male in no acute distress.  His mother joined [place] via telephone HEENT: Normocephalic and atraumatic. Conjunctivae are normal. No scleral icterus. Neck supple.  Cardiovascular: Normal rate, regular rhythm.  Pulmonary/chest: Effort normal and breath sounds normal. No wheezing, rales or rhonchi. Abdominal: Soft, nondistended, nontender. Bowel sounds active throughout. There are no masses palpable. No hepatomegaly. Extremities: no edema Rectal: Deferred until time of colonoscopy Neurological: Alert and oriented to person place and time. Skin: Skin is warm and dry. No rashes noted. Psychiatric: Normal mood and  affect. Behavior is normal.  CBC    Component Value Date/Time   WBC 4.1 03/05/2022 0510   RBC 5.18 03/05/2022 0510   HGB 15.2 03/05/2022 0510   HGB 13.4 03/07/2014 2009   HCT 44.7 03/05/2022 0510   HCT 39.8 (L) 03/07/2014 2009   PLT 178 03/05/2022 0510   PLT 166 03/07/2014 2009   MCV 86.3 03/05/2022 0510   MCV 85 03/07/2014 2009   MCH 29.3 03/05/2022 0510   MCHC 34.0 03/05/2022 0510   RDW 12.4 03/05/2022 0510   RDW 13.6 03/07/2014 2009   LYMPHSABS 2.2 03/05/2022 0510   LYMPHSABS 1.3 03/07/2014 2009   MONOABS 0.3 03/05/2022 0510   MONOABS 0.4 03/07/2014 2009   EOSABS 0.2 03/05/2022 0510   EOSABS 0.2 03/07/2014 2009   BASOSABS 0.1 03/05/2022 0510   BASOSABS 0.1 03/07/2014 2009    CMP     Component Value Date/Time   NA 141 03/05/2022 0510   NA 138 03/07/2014 2009   K 3.6 03/05/2022 0510   K 3.8 03/07/2014 2009   CL 111 03/05/2022 0510   CL 106 03/07/2014 2009   CO2 22 03/05/2022 0510   CO2 26 (H) 03/07/2014 2009   GLUCOSE 83 03/05/2022 0510   GLUCOSE 88 03/07/2014 2009   BUN 12 03/05/2022 0510   BUN 11 03/07/2014 2009   CREATININE 1.18 03/05/2022 0510   CREATININE 1.08 03/12/2020 1004   CALCIUM 9.3 03/05/2022 0510   CALCIUM 9.1 03/07/2014 2009   PROT 7.1 03/05/2022 0510   PROT 6.6 03/07/2014 2009   ALBUMIN 4.6 03/05/2022 0510   ALBUMIN 3.9 03/07/2014 2009   AST 25 03/05/2022 0510   AST 26 03/07/2014 2009   ALT 27 03/05/2022 0510   ALT 27 03/07/2014 2009   ALKPHOS 79 03/05/2022 0510   ALKPHOS 295 (H) 03/07/2014 2009   BILITOT 0.5 03/05/2022 0510   BILITOT 0.4 03/07/2014 2009   GFRNONAA >60 03/05/2022 0510   GFRAA >60 04/29/2020 1011     ASSESSMENT AND PLAN: 21 year old male with multitude of GI symptoms to include abdominal pain, nausea, vomiting chest pain, atypical dysphagia, irregular bowel habits, rectal pain, hematochezia who has been persistently underweight and has a history of eosinophilic esophagitis as a child.  An EGD is indicated to assess for  disease  activity of his eosinophilic esophagitis and to confirm the diagnosis.  I think a colonoscopy is also indicated to more definitively evaluate for Crohn's disease as a cause of his abdominal pain, hematochezia and low weight.  We will schedule him for an EGD and a colonoscopy.  We will get a TTG/IgA, CRP, H. pylori stool antigen and iron, B12 and vitamin D levels.  He has a history of hypokalemia.  We will repeat a CMP.  Nausea, vomiting, atypical dysphagia reported history of eosinophilic esophagitis - EGD with esophageal biopsies - TTG/IgA  B12 deficiency, low titer intrinsic factor antibody - EGD with gastric biopsies to assess for autoimmune gastritis -Recheck B12, vitamin D, iron panel  Abdominal pain, underweight, hematochezia - Colonoscopy to rule out Crohn's disease - CRP  The details, risks (including bleeding, perforation, infection, missed lesions, medication reactions and possible hospitalization or surgery if complications occur), benefits, and alternatives to EGD/colonoscopy with possible biopsy, dilation and polypectomy were discussed with the patient and he consents to proceed.   Neziah Vogelgesang E. Candis Schatz, MD [facility]   [place]te, Alinda Sierras, MD

## 2022-04-27 NOTE — Patient Instructions (Signed)
_______________________________________________________  If you are age 22 or older, your body mass index should be between 23-30. Your Body mass index is 17.36 kg/m. If this is out of the aforementioned range listed, please consider follow up with your Primary Care Provider.  If you are age 71 or younger, your body mass index should be between 19-25. Your Body mass index is 17.36 kg/m. If this is out of the aformentioned range listed, please consider follow up with your Primary Care Provider.   Your provider has requested that you go to the basement level for lab work before leaving today. Press "B" on the elevator. The lab is located at the first door on the left as you exit the elevator.  You have been scheduled for an endoscopy and colonoscopy. Please follow the written instructions given to you at your visit today. Please pick up your prep supplies at the pharmacy within the next 1-3 days. If you use inhalers (even only as needed), please bring them with you on the day of your procedure.   Due to recent changes in healthcare laws, you may see the results of your imaging and laboratory studies on MyChart before your provider has had a chance to review them.  We understand that in some cases there may be results that are confusing or concerning to you. Not all laboratory results come back in the same time frame and the provider may be waiting for multiple results in order to interpret others.  Please give [place] 48 hours in order for your provider to thoroughly review all the results before contacting the office for clarification of your results.    The [facility] providers would like to encourage you to use [facility] to communicate with providers for non-urgent requests or questions.  Due to long hold times on the telephone, sending your provider a message by [facility] may be a faster and more efficient way to get a response.  Please allow 48 business hours for a response.  Please remember that this is  for non-urgent requests.  _______________________________________________________ It was a pleasure to see you today!  Thank you for trusting me with your gastrointestinal care!    Scott E.Candis Schatz, MD

## 2022-04-28 LAB — TISSUE TRANSGLUTAMINASE, IGA: (tTG) Ab, IgA: <1 U/mL

## 2022-04-28 LAB — IGA: Immunoglobulin A: 173 mg/dL (ref 47–310)

## 2022-04-30 ENCOUNTER — Other Ambulatory Visit: Payer: Self-pay

## 2022-04-30 NOTE — Progress Notes (Signed)
Terrence Riley,  Overall your labs were reassuring.  You have a stable mild low WBC and platelet count.  Your vitamin D level was low, and you would likely benefit from vitamin D supplements.  I will prescribe ergocalciferol 50,000 international units, 1 tab by mouth weekly for 12 weeks. Your iron saturation level was slightly low, but the rest of your iron indices were not suggestive of iron deficiency, so I do not recommend any iron supplementation. Your inflammatory markers and test for celiac disease were negative. We will see you soon for your upper and lower endoscopy.  Vaughan Basta, Can you please put in a prescription for ergocalciferol 50,000 international units weekly #12, refill 0

## 2022-05-11 ENCOUNTER — Encounter: Payer: Self-pay | Admitting: Gastroenterology

## 2022-05-15 ENCOUNTER — Ambulatory Visit: Payer: BC Managed Care – PPO | Admitting: Gastroenterology

## 2022-05-15 ENCOUNTER — Encounter: Payer: Self-pay | Admitting: Gastroenterology

## 2022-05-15 VITALS — BP 88/34 | HR 65 | Temp 97.3°F | Resp 16 | Ht 72.0 in | Wt 128.0 lb

## 2022-05-15 DIAGNOSIS — R636 Underweight: Secondary | ICD-10-CM

## 2022-05-15 DIAGNOSIS — R109 Unspecified abdominal pain: Secondary | ICD-10-CM

## 2022-05-15 DIAGNOSIS — R112 Nausea with vomiting, unspecified: Secondary | ICD-10-CM

## 2022-05-15 DIAGNOSIS — D125 Benign neoplasm of sigmoid colon: Secondary | ICD-10-CM | POA: Diagnosis not present

## 2022-05-15 DIAGNOSIS — K2950 Unspecified chronic gastritis without bleeding: Secondary | ICD-10-CM | POA: Diagnosis not present

## 2022-05-15 DIAGNOSIS — K200 Eosinophilic esophagitis: Secondary | ICD-10-CM

## 2022-05-15 DIAGNOSIS — E538 Deficiency of other specified B group vitamins: Secondary | ICD-10-CM

## 2022-05-15 HISTORY — PX: UPPER GASTROINTESTINAL ENDOSCOPY: SHX188

## 2022-05-15 MED ORDER — SODIUM CHLORIDE 0.9 % IV SOLN: 500.0000 mL | Freq: Once | INTRAVENOUS | Status: DC

## 2022-05-15 NOTE — Patient Instructions (Signed)
Resume previous diet and medications. Awaiting pathology results.  YOU HAD AN ENDOSCOPIC PROCEDURE TODAY AT THE [facility]:   Refer to the procedure report that was given to you for any specific questions about what was found during the examination.  If the procedure report does not answer your questions, please call your gastroenterologist to clarify.  If you requested that your care partner not be given the details of your procedure findings, then the procedure report has been included in a sealed envelope for you to review at your convenience later.  YOU SHOULD EXPECT: Some feelings of bloating in the abdomen. Passage of more gas than usual.  Walking can help get rid of the air that was put into your GI tract during the procedure and reduce the bloating. If you had a lower endoscopy (such as a colonoscopy or flexible sigmoidoscopy) you may notice spotting of blood in your stool or on the toilet paper. If you underwent a bowel prep for your procedure, you may not have a normal bowel movement for a few days.  Please Note:  You might notice some irritation and congestion in your nose or some drainage.  This is from the oxygen used during your procedure.  There is no need for concern and it should clear up in a day or so.  SYMPTOMS TO REPORT IMMEDIATELY:  Following upper endoscopy (EGD)  Vomiting of blood or coffee ground material  New chest pain or pain under the shoulder blades  Painful or persistently difficult swallowing  New shortness of breath  Fever of 100F or higher  Black, tarry-looking stools  For urgent or emergent issues, a gastroenterologist can be reached at any hour by calling [phone]. Do not use MyChart messaging for urgent concerns.    DIET:  We do recommend a small meal at first, but then you may proceed to your regular diet.  Drink plenty of fluids but you should avoid alcoholic beverages for 24 hours.  ACTIVITY:  You should plan to take it easy for the  rest of today and you should NOT DRIVE or use heavy machinery until tomorrow (because of the sedation medicines used during the test).    FOLLOW UP: Our staff will call the number listed on your records the next business day following your procedure.  We will call around 7:15- 8:00 am to check on you and address any questions or concerns that you may have regarding the information given to you following your procedure. If we do not reach you, we will leave a message.     If any biopsies were taken you will be contacted by phone or by letter within the next 1-3 weeks.  Please call us at [phone] if you have not heard about the biopsies in 3 weeks.    SIGNATURES/CONFIDENTIALITY: You and/or your care partner have signed paperwork which will be entered into your electronic medical record.  These signatures attest to the fact that that the information above on your After Visit Summary has been reviewed and is understood.  Full responsibility of the confidentiality of this discharge information lies with you and/or your care-partner. 

## 2022-05-15 NOTE — Progress Notes (Signed)
Called to room to assist during endoscopic procedure.  Patient ID and intended procedure confirmed with present staff. Received instructions for my participation in the procedure from the performing physician.  

## 2022-05-15 NOTE — Progress Notes (Signed)
History and Physical Interval Note:  05/15/2022 3:09 PM  Terrence Riley  has presented today for endoscopic procedure(s), with the diagnosis of  Encounter Diagnoses  Name Primary?   Nausea and vomiting, unspecified vomiting type Yes   Abdominal pain, unspecified abdominal location    Underweight    Eosinophilic esophagitis    D03 deficiency   .  The various methods of evaluation and treatment have been discussed with the patient and/or family. After consideration of risks, benefits and other options for treatment, the patient has consented to  the endoscopic procedure(s).  The patient was scheduled to undergo an EGD and colonoscopy, however the patient did not follow the written instructions correctly for his bowel prep (only did the first half of his prep this morning, has not had a bowel movement yet) and so his colonoscopy was cancelled and rescheduled for next week.  Will proceed with the EGD today.   The patient's history has been reviewed, patient examined, no change in status, stable for endoscopic procedure(s).  I have reviewed the patient's chart and labs.  Questions were answered to the patient's satisfaction.     Vassie Kugel E. Candis Schatz, MD [facility] '

## 2022-05-15 NOTE — Op Note (Signed)
[facility] Patient Name: Terrence Riley Procedure Date: 05/15/2022 3:12 PM MRN: [id number] Endoscopist: Nicki Reaper E. Candis Schatz , MD Age: 21 Referring MD:  Date of Birth: 18-Feb-2001 Gender: Male Account #: [id number] Procedure:                Upper GI endoscopy Indications:              Generalized abdominal pain, Follow-up of                            eosinophilic esophagitis, Nausea with vomiting.                            Patient was reported diagnosed with eosinophilic                            esophagitis as an infant with symptoms of                            nausea/vomiting and underweight, previously treated                            with swallowed steroids. He has been off all EoE                            medications for about 10 years. Currently having                            abdominal pain, nausea/vomiting and inability to                            gain weight. Atypical dysphagia symptoms. He also                            has B12 deficiency. Medicines:                Monitored Anesthesia Care Procedure:                Pre-Anesthesia Assessment:                           - Prior to the procedure, a History and Physical                            was performed, and patient medications and                            allergies were reviewed. The patient's tolerance of                            previous anesthesia was also reviewed. The risks                            and benefits of the procedure and the sedation  options and risks were discussed with the patient.                            All questions were answered, and informed consent                            was obtained. Prior Anticoagulants: The patient has                            taken no previous anticoagulant or antiplatelet                            agents. ASA Grade Assessment: II - A patient with                            mild systemic disease. After  reviewing the risks                            and benefits, the patient was deemed in                            satisfactory condition to undergo the procedure.                           After obtaining informed consent, the endoscope was                            passed under direct vision. Throughout the                            procedure, the patient's blood pressure, pulse, and                            oxygen saturations were monitored continuously. The                            GIF [id number] #[id number] was introduced through the                            mouth, and advanced to the second part of duodenum.                            The upper GI endoscopy was accomplished without                            difficulty. The patient tolerated the procedure                            well. Scope In: Scope Out: Findings:                 The examined portions of the nasopharynx,                            oropharynx  and larynx were normal.                           The examined esophagus was normal. Biopsies were                            obtained from the proximal and distal esophagus                            with cold forceps for histology of suspected                            eosinophilic esophagitis. Estimated blood loss was                            minimal.                           The entire examined stomach was normal. Biopsies                            were taken with a cold forceps for Helicobacter                            pylori testing. Estimated blood loss was minimal.                           The examined duodenum was normal. Biopsies for                            histology were taken with a cold forceps for                            evaluation of celiac disease. Estimated blood loss                            was minimal. Complications:            No immediate complications. Estimated Blood Loss:     Estimated blood loss was minimal. Impression:                - The examined portions of the nasopharynx,                            oropharynx and larynx were normal.                           - Normal esophagus. Biopsied. No endoscopic                            findings suggestive of eosinophilic esophagitis.                           - Normal stomach. Biopsied.                           -  Normal examined duodenum. Biopsied. Recommendation:           - Patient has a contact number available for                            emergencies. The signs and symptoms of potential                            delayed complications were discussed with the                            patient. Return to normal activities tomorrow.                            Written discharge instructions were provided to the                            patient.                           - Resume previous diet.                           - Continue present medications.                           - Await pathology results. Gilberto Stanforth E. Candis Schatz, MD 05/15/2022 3:44:26 PM This report has been signed electronically.

## 2022-05-15 NOTE — Progress Notes (Signed)
Pt's states no medical or surgical changes since previsit or office visit. 

## 2022-05-15 NOTE — Progress Notes (Signed)
Pt did not complete prep and has not had a BM as of arrival to [facility]. Plan to proceed with EGD today. Rescheduled colonoscopy for 05/18/22 at 9:00am. Instructions printed and reviewed with pt's mother per his request. She verbalized understanding.

## 2022-05-15 NOTE — Progress Notes (Signed)
Sedate, gd SR, tolerated procedure well, VSS, report to RN 

## 2022-05-17 ENCOUNTER — Other Ambulatory Visit: Payer: Self-pay | Admitting: Surgical

## 2022-05-18 ENCOUNTER — Telehealth: Payer: Self-pay | Admitting: *Deleted

## 2022-05-18 ENCOUNTER — Ambulatory Visit (AMBULATORY_SURGERY_CENTER): Payer: BC Managed Care – PPO | Admitting: Gastroenterology

## 2022-05-18 ENCOUNTER — Encounter: Payer: Self-pay | Admitting: Gastroenterology

## 2022-05-18 ENCOUNTER — Ambulatory Visit: Payer: BC Managed Care – PPO | Admitting: Orthopedic Surgery

## 2022-05-18 VITALS — BP 118/71 | HR 50 | Temp 97.3°F | Resp 14 | Ht 72.0 in | Wt 128.0 lb

## 2022-05-18 DIAGNOSIS — K921 Melena: Secondary | ICD-10-CM | POA: Diagnosis not present

## 2022-05-18 DIAGNOSIS — K635 Polyp of colon: Secondary | ICD-10-CM

## 2022-05-18 DIAGNOSIS — R636 Underweight: Secondary | ICD-10-CM

## 2022-05-18 DIAGNOSIS — R109 Unspecified abdominal pain: Secondary | ICD-10-CM | POA: Diagnosis not present

## 2022-05-18 DIAGNOSIS — D125 Benign neoplasm of sigmoid colon: Secondary | ICD-10-CM

## 2022-05-18 MED ORDER — SODIUM CHLORIDE 0.9 % IV SOLN: 500.0000 mL | Freq: Once | INTRAVENOUS | Status: DC

## 2022-05-18 NOTE — Progress Notes (Signed)
To pacu, VSS. Report to [email or web address].tb 

## 2022-05-18 NOTE — Op Note (Signed)
[facility] Patient Name: Terrence Riley Procedure Date: 05/18/2022 9:17 AM MRN: [id number] Endoscopist: Nicki Reaper E. Candis Schatz , MD Age: 21 Referring MD:  Date of Birth: [birth year] Gender: Male Account #: [id number] Procedure:                Colonoscopy Indications:              Hematochezia, abdominal pain Medicines:                Monitored Anesthesia Care Procedure:                Pre-Anesthesia Assessment:                           - Prior to the procedure, a History and Physical                            was performed, and patient medications and                            allergies were reviewed. The patient's tolerance of                            previous anesthesia was also reviewed. The risks                            and benefits of the procedure and the sedation                            options and risks were discussed with the patient.                            All questions were answered, and informed consent                            was obtained. Prior Anticoagulants: The patient has                            taken no previous anticoagulant or antiplatelet                            agents. ASA Grade Assessment: II - A patient with                            mild systemic disease. After reviewing the risks                            and benefits, the patient was deemed in                            satisfactory condition to undergo the procedure.                           After obtaining informed consent, the colonoscope  was passed under direct vision. Throughout the                            procedure, the patient's blood pressure, pulse, and                            oxygen saturations were monitored continuously. The                            PCF-HQ190L Colonoscope was introduced through the                            anus and advanced to the the terminal ileum, with                            identification of the  appendiceal orifice and IC                            valve. The colonoscopy was performed without                            difficulty. The patient tolerated the procedure                            well. The quality of the bowel preparation was                            good. The terminal ileum, ileocecal valve,                            appendiceal orifice, and rectum were photographed.                            The bowel preparation used was SUPREP via split                            dose instruction. Scope In: 9:29:36 AM Scope Out: 9:42:57 AM Scope Withdrawal Time: 0 hours 9 minutes 5 seconds  Total Procedure Duration: 0 hours 13 minutes 21 seconds  Findings:                 The perianal and digital rectal examinations were                            normal. Pertinent negatives include normal                            sphincter tone and no palpable rectal lesions.                           A 3 mm polyp was found in the sigmoid colon. The                            polyp was sessile. The polyp was removed with a  cold snare. Resection and retrieval were complete.                            Estimated blood loss was minimal.                           The exam was otherwise normal throughout the                            examined colon.                           The terminal ileum appeared normal.                           The retroflexed view of the distal rectum and anal                            verge was normal and showed no anal or rectal                            abnormalities. Complications:            No immediate complications. Estimated Blood Loss:     Estimated blood loss was minimal. Impression:               - One 3 mm polyp in the sigmoid colon, removed with                            a cold snare. Resected and retrieved.                           - The examined portion of the ileum was normal.                           - The distal  rectum and anal verge are normal on                            retroflexion view.                           - No abnormalities to explain patient's abdominal                            pain and low weight. Although no prominent                            hemorrhoid columns seen, the patient's hemochezia                            can be attributed to hemorrhoids, given the absence                            of other findings on today's exam. Recommendation:           - Patient has  a contact number available for                            emergencies. The signs and symptoms of potential                            delayed complications were discussed with the                            patient. Return to normal activities tomorrow.                            Written discharge instructions were provided to the                            patient.                           - Resume previous diet.                           - Continue present medications.                           - Await pathology results.                           - Repeat colonoscopy (date not yet determined) for                            surveillance based on pathology results.                           - Return to GI office at appointment to be                            scheduled to discuss ongoing management of chronic                            GI symptoms.                           - Recommend starting Metamucil on a daily basis. [name] E. Candis Schatz, MD 05/18/2022 9:51:52 AM This report has been signed electronically.

## 2022-05-18 NOTE — Progress Notes (Signed)
History and Physical Interval Note:  05/18/2022 9:17 AM  Terrence Riley  has presented today for endoscopic procedure(s), with the diagnosis of  Encounter Diagnoses  Name Primary?   Hematochezia Yes   Underweight    Abdominal pain, unspecified abdominal location   .  The various methods of evaluation and treatment have been discussed with the patient and/or family. After consideration of risks, benefits and other options for treatment, the patient has consented to  the endoscopic procedure(s).   The patient's history has been reviewed, patient examined, no change in status, stable for endoscopic procedure(s).  I have reviewed the patient's chart and labs.  Questions were answered to the patient's satisfaction.     Digby Groeneveld E. Candis Schatz, MD [facility]

## 2022-05-18 NOTE — Patient Instructions (Signed)
Handouts on polyps and hemorrhoids given to you today  Await pathology results Metamucil daily recommended   YOU HAD AN ENDOSCOPIC PROCEDURE TODAY AT [facility]:   Refer to the procedure report that was given to you for any specific questions about what was found during the examination.  If the procedure report does not answer your questions, please call your gastroenterologist to clarify.  If you requested that your care partner not be given the details of your procedure findings, then the procedure report has been included in a sealed envelope for you to review at your convenience later.  YOU SHOULD EXPECT: Some feelings of bloating in the abdomen. Passage of more gas than usual.  Walking can help get rid of the air that was put into your GI tract during the procedure and reduce the bloating. If you had a lower endoscopy (such as a colonoscopy or flexible sigmoidoscopy) you may notice spotting of blood in your stool or on the toilet paper. If you underwent a bowel prep for your procedure, you may not have a normal bowel movement for a few days.  Please Note:  You might notice some irritation and congestion in your nose or some drainage.  This is from the oxygen used during your procedure.  There is no need for concern and it should clear up in a day or so.  SYMPTOMS TO REPORT IMMEDIATELY:  Following lower endoscopy (colonoscopy or flexible sigmoidoscopy):  Excessive amounts of blood in the stool  Significant tenderness or worsening of abdominal pains  Swelling of the abdomen that is new, acute  Fever of 100F or higher  For urgent or emergent issues, a gastroenterologist can be reached at any hour by calling [phone]. Do not use MyChart messaging for urgent concerns.    DIET:  We do recommend a small meal at first, but then you may proceed to your regular diet.  Drink plenty of fluids but you should avoid alcoholic beverages for 24 hours.  ACTIVITY:  You should plan  to take it easy for the rest of today and you should NOT DRIVE or use heavy machinery until tomorrow (because of the sedation medicines used during the test).    FOLLOW UP: Our staff will call the number listed on your records the next business day following your procedure.  We will call around 7:15- 8:00 am to check on you and address any questions or concerns that you may have regarding the information given to you following your procedure. If we do not reach you, we will leave a message.     If any biopsies were taken you will be contacted by phone or by letter within the next 1-3 weeks.  Please call us at [phone] if you have not heard about the biopsies in 3 weeks.    SIGNATURES/CONFIDENTIALITY: You and/or your care partner have signed paperwork which will be entered into your electronic medical record.  These signatures attest to the fact that that the information above on your After Visit Summary has been reviewed and is understood.  Full responsibility of the confidentiality of this discharge information lies with you and/or your care-partner.

## 2022-05-18 NOTE — Progress Notes (Signed)
Pt's states no medical or surgical changes since previsit or office visit. VS assessed by D.T 

## 2022-05-18 NOTE — Telephone Encounter (Signed)
  Follow up Call-     05/15/2022    1:58 PM  Call back number  Post procedure Call Back phone  # pt coming in 05/18/22 for colon at 9 am  Permission to leave phone message Yes     Patient questions:  Do you have a fever, pain , or abdominal swelling? No. Pain Score  0 *  Have you tolerated food without any problems? Yes.    Have you been able to return to your normal activities? Yes.    Do you have any questions about your discharge instructions: Diet   No. Medications  No. Follow up visit  No.  Do you have questions or concerns about your Care? No. Patient coming in this morning at 05/18/2022 for colonoscopy.   Actions: * If pain score is 4 or above: No action needed, pain <4.

## 2022-05-18 NOTE — Progress Notes (Signed)
From [id number] patient had whole body trembling. Patient still sleeping from procedure. CRNA and MD aware. Trembling stopped and patient became responsive at 1000 with no further symptoms.

## 2022-05-19 ENCOUNTER — Telehealth: Payer: Self-pay

## 2022-05-19 NOTE — Telephone Encounter (Signed)
Attempted f/u call. No answer, left VM. 

## 2022-05-21 NOTE — Progress Notes (Signed)
[name],  The polyp removed from your colon was a hyperplastic polyp.  These types of polyps are 100% benign with no precancerous potential.  You are still considered average risk for colon cancer, and should repeat a screening colonoscopy at age 21.

## 2022-05-21 NOTE — Progress Notes (Signed)
[name],  The biopsies taken from your stomach were notable for mild chronic gastritis (inflammation) which is a common finding, but there was no evidence of Helicobacter pylori infection. This common finding is not likely to explain your symptoms and there is no specific treatment or further evaluation recommended. The biopsies of your duodenum and esophagus were normal, with no evidence of celiac disease, eosinophilic enteritis, or eosinophilic esophagitis. Please follow-up with me in the office to discuss further management of your chronic GI symptoms.

## 2022-06-03 ENCOUNTER — Ambulatory Visit: Payer: BC Managed Care – PPO | Admitting: Orthopedic Surgery

## 2022-06-24 ENCOUNTER — Ambulatory Visit: Payer: BC Managed Care – PPO | Admitting: Orthopedic Surgery

## 2022-07-02 ENCOUNTER — Ambulatory Visit: Payer: BC Managed Care – PPO | Admitting: Gastroenterology

## 2022-07-06 ENCOUNTER — Ambulatory Visit (INDEPENDENT_AMBULATORY_CARE_PROVIDER_SITE_OTHER): Payer: BC Managed Care – PPO

## 2022-07-06 ENCOUNTER — Ambulatory Visit (INDEPENDENT_AMBULATORY_CARE_PROVIDER_SITE_OTHER): Payer: BC Managed Care – PPO | Admitting: Orthopedic Surgery

## 2022-07-06 DIAGNOSIS — M25511 Pain in right shoulder: Secondary | ICD-10-CM | POA: Diagnosis not present

## 2022-07-07 ENCOUNTER — Ambulatory Visit: Payer: BC Managed Care – PPO | Admitting: Gastroenterology

## 2022-07-08 ENCOUNTER — Other Ambulatory Visit: Payer: Self-pay

## 2022-07-08 DIAGNOSIS — M25511 Pain in right shoulder: Secondary | ICD-10-CM

## 2022-07-08 DIAGNOSIS — Z9889 Other specified postprocedural states: Secondary | ICD-10-CM

## 2022-07-09 ENCOUNTER — Encounter: Payer: Self-pay | Admitting: Orthopedic Surgery

## 2022-07-09 NOTE — Progress Notes (Signed)
Office Visit Note   Patient: Terrence Riley           Date of Birth: 11/24/2000           MRN: [id number] Visit Date: 07/06/2022 Requested by: Eulas Post, MD [place],  [place] [address] PCP: Eulas Post, MD  Subjective: Chief Complaint  Patient presents with   Right Shoulder - Pain    HPI: Terrence Riley is a 21 y.o. male who presents to the office reporting right shoulder pain.  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  At the end of November he describes having had contact with the police.  He states he was pulled from his car and forced into handcuffs which involved bringing his hands behind his back and upward force applied.  He felt like his shoulder dislocated at that time on the right-hand side.  He also states that it went back in place on its own.  He is not a smoker.  Describes pain and weakness since that injury.  Does wake him from sleep at night.  Has some popping when he lays down.  Describes popping sensation in the shoulder since that time..                ROS: All systems reviewed are negative as they relate to the chief complaint within the history of present illness.  Patient denies fevers or chills.  Assessment & Plan: Visit Diagnoses:  1. Right shoulder pain, unspecified chronicity     Plan: Impression is right shoulder pain following possible dislocation episode from being handcuffed.  Left shoulder is doing well.  He does have a new Popeye deformity indicating injury to the biceps tendon.  Subscap strength is also weaker on the right compared to the left.  Concern at this time is for subscapularis tear.  Needs relatively urgent MRI arthrogram of the right shoulder to evaluate for this possibility.  Follow-up after that study.  May consider ultrasound next visit to correlate MRI findings if possible.  Follow-Up Instructions: No follow-ups on file.   Orders:  Orders  Placed This Encounter  Procedures   XR Shoulder Right   No orders of the defined types were placed in this encounter.     Procedures: No procedures performed   Clinical Data: No additional findings.  Objective: Vital Signs: There were no vitals taken for this visit.  Physical Exam:  Constitutional: Patient appears well-developed HEENT:  Head: Normocephalic Eyes:EOM are normal Neck: Normal range of motion Cardiovascular: Normal rate Pulmonary/chest: Effort normal Neurologic: Patient is alert Skin: Skin is warm Psychiatric: Patient has normal mood and affect  Ortho Exam: Ortho exam demonstrates pretty reasonable shoulder stability on the left-hand side.  Good range of motion on the left.  On the right-hand side he has any Popeye deformity.  Subscap weakness is present at 4- out of 5 on the right compared to 5+ out of 5 on the left.  He is got about passive range of motion of 70/100/180.  Mild anterior apprehension on the right.  Deltoid is functional.  No posterior apprehension on the right.  Left shoulder has good range of motion without too much pain grinding or crepitus.  Specialty Comments:  No specialty comments available.  Imaging: No results found.   PMFS History: Patient Active Problem List   Diagnosis Date Noted   Hypokalemia 06/26/2021   B12 deficiency 06/26/2021   Rhabdomyolysis 06/26/2021  Acute encephalopathy 06/20/2021   Aspiration pneumonia ([place]) 06/20/2021   Febrile illness    Shoulder instability, right    Humeral head fracture, right, with malunion, subsequent encounter    Esophagitis determined by endoscopy 07/24/2020   Labral tear of shoulder, left, subsequent encounter 02/13/2020   Shoulder dislocation, left, subsequent encounter 02/13/2020   Seizure disorder ([facility]) 12/22/2019   Migraines 12/22/2019   Mild intermittent asthma 12/22/2019   Migraine without aura and without status migrainosus, not intractable 06/29/2019   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/15/2016   Altered mental status 03/12/2014   Lethargic 03/09/2014   Environmental allergies 03/01/2014   Gastroesophageal reflux disease with esophagitis 10/06/2013   Localization-related (focal) (partial) symptomatic epilepsy and epileptic syndromes with complex partial seizures, not intractable, without status epilepticus ([place]) 10/06/2013   Spells 07/26/2013   Allergy to other foods 07/13/2013   Prophylactic immunotherapy 07/13/2013   Unspecified asthma, uncomplicated 82/95/6213   Eosinophilic esophagitis 08/65/7846   Gastroesophageal reflux 01/23/2013   Vomiting 01/23/2013   Leukopenia 10/19/2012   Muscle jerks during sleep 10/19/2012   Constipation 08/20/2012   Neutropenia ([place]) 08/20/2012   Allergic rhinitis due to pollen 02/19/2011   Past Medical History:  Diagnosis Date   Allergy    rhinitis   Asthma    as a child   Complication of anesthesia    pt. reports he need more anesthesia with the septoplasty surgery   Eosinophilic esophagitis    heartburn and reflux   Family history of adverse reaction to anesthesia    mother respiratory failure   Headache    Nasal fracture    deviated septum   Pneumonia    1x history of   Premature baby    2 months early   Seizures ([place])    well controlled on meds/ one 2 months ago when meds were late 11/2021    Family History  Problem Relation Age of Onset   Cancer Mother        breat   Clotting disorder Mother    Protein C deficiency Sister    Stomach cancer Neg Hx    Esophageal cancer Neg Hx    Colon cancer Neg Hx     Past Surgical History:  Procedure Laterality Date   ADENOIDECTOMY     CLOSED REDUCTION NASAL FRACTURE N/A 08/31/2017   Procedure: CLOSED REDUCTION NASAL FRACTURE;  Surgeon: Clyde Canterbury, MD;  Location: [facility];  Service: ENT;  Laterality: N/A;   ORIF HUMERUS FRACTURE Right 05/05/2021   Procedure: reverse Hill-Sachs allografting, biceps tenodesis;  Surgeon: [name], MD;  Location: [facility];  Service: Orthopedics;  Laterality: Right;   SEPTOPLASTY N/A 08/31/2017   Procedure: SEPTOPLASTY;  Surgeon: Clyde Canterbury, MD;  Location: [facility];  Service: ENT;  Laterality: N/A;   SHOULDER ARTHROSCOPY WITH LABRAL REPAIR Left 04/30/2020   Procedure: LEFT SHOULDER POSTERIOR LABRAL REPAIR WITH ARTHROSCOPY, BICEPS TENDON RELEASE AND TENODESIS, OPEN ALLOGRAFT FOR REVERSE BANKART LESION;  Surgeon: [name], MD;  Location: [facility];  Service: Orthopedics;  Laterality: Left;   SHOULDER ARTHROSCOPY WITH LABRAL REPAIR Right 05/05/2021   Procedure: right shoulder arthroscopy, biceps release, posterior labral repair;;  Surgeon: [name], MD;  Location: [facility];  Service: Orthopedics;  Laterality: Right;   TONSILLECTOMY     and addenoids   UPPER GASTROINTESTINAL ENDOSCOPY  05/15/2022   Social History   Occupational History   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

## 2022-07-17 ENCOUNTER — Ambulatory Visit: Payer: BC Managed Care – PPO | Admitting: Family Medicine

## 2022-07-21 ENCOUNTER — Ambulatory Visit: Payer: BC Managed Care – PPO | Admitting: Family Medicine

## 2022-07-28 ENCOUNTER — Other Ambulatory Visit (HOSPITAL_COMMUNITY): Payer: Self-pay

## 2022-07-28 ENCOUNTER — Encounter: Payer: Self-pay | Admitting: Neurology

## 2022-07-28 ENCOUNTER — Other Ambulatory Visit: Payer: Self-pay

## 2022-07-28 ENCOUNTER — Ambulatory Visit (INDEPENDENT_AMBULATORY_CARE_PROVIDER_SITE_OTHER): Payer: BC Managed Care – PPO | Admitting: Family Medicine

## 2022-07-28 ENCOUNTER — Encounter: Payer: Self-pay | Admitting: Family Medicine

## 2022-07-28 VITALS — BP 100/60 | HR 56 | Temp 98.1°F | Ht 72.0 in | Wt 122.8 lb

## 2022-07-28 DIAGNOSIS — R630 Anorexia: Secondary | ICD-10-CM

## 2022-07-28 DIAGNOSIS — G4700 Insomnia, unspecified: Secondary | ICD-10-CM

## 2022-07-28 DIAGNOSIS — R1013 Epigastric pain: Secondary | ICD-10-CM

## 2022-07-28 DIAGNOSIS — R5383 Other fatigue: Secondary | ICD-10-CM | POA: Diagnosis not present

## 2022-07-28 MED ORDER — TOPIRAMATE ER 200 MG PO CAP24: ORAL_CAPSULE | ORAL | 5 refills | Status: DC

## 2022-07-28 MED ORDER — MIRTAZAPINE 15 MG PO TBDP: 15.0000 mg | ORAL_TABLET | Freq: Every day | ORAL | 0 refills | Status: DC

## 2022-07-28 NOTE — Progress Notes (Signed)
Established Patient Office Visit  Subjective   Patient ID: Terrence Riley, male    DOB: 06-Sep-2000  Age: 22 y.o. MRN: [id number]  Chief Complaint  Patient presents with   Anorexia    Patient complains of loss of appetite, x7 days    Abdominal Pain    Patient complains of abdominal pain, x7 days    Fatigue         HPI   Seen today with vague symptoms of least couple weeks if not longer of increasing fatigue, epigastric abdominal pain, nausea with occasional vomiting, loss of appetite.  His weight is actually recently stable.  His chronic problems include history of migraine headaches, GERD, seizure disorder, history of B12 deficiency.  He is followed by neurology and maintained on Topamax and Keppra.  With regard to his loss of appetite he feels like this is not related to the Topamax.  He denies any recent seizure.  He had occasional diarrhea stools past few days.  No bloody stools.  Denies alcohol use.  No nonsteroidal use.  He had EGD 05/15/2022 which was unremarkable.  Helicobacter pylori testing then negative.  No ulcers noted. Last January had upper GI with small bowel follow-through which did not show any obvious constricting lesions.  He had CT abdomen pelvis this past April which showed no acute abnormality.  He states he has difficulty sleeping nightly and very poor appetite but denies any active depression issues currently.  Past Medical History:  Diagnosis Date   Allergy    rhinitis   Asthma    as a child   Complication of anesthesia    pt. reports he need more anesthesia with the septoplasty surgery   Eosinophilic esophagitis    heartburn and reflux   Family history of adverse reaction to anesthesia    mother respiratory failure   Headache    Nasal fracture    deviated septum   Pneumonia    1x history of   Premature baby    2 months early   Seizures ([place])    well controlled on meds/ one 2 months ago when meds were late 11/2021   Past Surgical History:   Procedure Laterality Date   ADENOIDECTOMY     CLOSED REDUCTION NASAL FRACTURE N/A 08/31/2017   Procedure: CLOSED REDUCTION NASAL FRACTURE;  Surgeon: Clyde Canterbury, MD;  Location: [facility];  Service: ENT;  Laterality: N/A;   ORIF HUMERUS FRACTURE Right 05/05/2021   Procedure: reverse Hill-Sachs allografting, biceps tenodesis;  Surgeon: [name], MD;  Location: [facility] [facility];  Service: Orthopedics;  Laterality: Right;   SEPTOPLASTY N/A 08/31/2017   Procedure: SEPTOPLASTY;  Surgeon: Clyde Canterbury, MD;  Location: [facility];  Service: ENT;  Laterality: N/A;   SHOULDER ARTHROSCOPY WITH LABRAL REPAIR Left 04/30/2020   Procedure: LEFT SHOULDER POSTERIOR LABRAL REPAIR WITH ARTHROSCOPY, BICEPS TENDON RELEASE AND TENODESIS, OPEN ALLOGRAFT FOR REVERSE BANKART LESION;  Surgeon: [name], MD;  Location: [facility];  Service: Orthopedics;  Laterality: Left;   SHOULDER ARTHROSCOPY WITH LABRAL REPAIR Right 05/05/2021   Procedure: right shoulder arthroscopy, biceps release, posterior labral repair;;  Surgeon: [name], MD;  Location: [facility];  Service: Orthopedics;  Laterality: Right;   TONSILLECTOMY     and addenoids   UPPER GASTROINTESTINAL ENDOSCOPY  05/15/2022    reports that he has never smoked. He has never used smokeless tobacco. He reports that he does not drink alcohol and does not use drugs. family history includes Cancer  in his mother; Clotting disorder in his mother; Protein C deficiency in his sister. Allergies  Allergen Reactions   Lortab [Hydrocodone-Acetaminophen] Hives   Other Anaphylaxis    Peanuts   Zofran [Ondansetron Hcl] Nausea And Vomiting   Citrus Other (See Comments)    Sores in mouth   Dairy Aid [Tilactase] Nausea And Vomiting   Influenza Virus Vaccine Other (See Comments)    Partial paralysis for a couple of weeks per mom   Soy Allergy Other (See Comments)    Seizures    Hydrocodone-Acetaminophen Rash    Review of Systems   Constitutional:  Positive for malaise/fatigue. Negative for chills and fever.  Respiratory:  Negative for cough and shortness of breath.   Cardiovascular:  Negative for chest pain.  Gastrointestinal:  Positive for abdominal pain.  Genitourinary:  Negative for dysuria.  Neurological:  Negative for headaches.  Psychiatric/Behavioral:  Negative for depression and suicidal ideas. The patient has insomnia.       Objective:     BP 100/60 (BP Location: Left Arm, Patient Position: Sitting, Cuff Size: Normal)   Pulse (!) 56   Temp 98.1 F (36.7 C) (Oral)   Ht 6' (1.829 m)   Wt 122 lb 12.8 oz (55.7 kg)   SpO2 98%   BMI 16.65 kg/m    Physical Exam Vitals reviewed.  Cardiovascular:     Rate and Rhythm: Normal rate and regular rhythm.  Pulmonary:     Effort: Pulmonary effort is normal.     Breath sounds: Normal breath sounds. No wheezing or rales.  Abdominal:     General: There is no distension or abdominal bruit.     Palpations: Abdomen is soft. There is no hepatomegaly or mass.  Neurological:     General: No focal deficit present.     Mental Status: He is alert.  Psychiatric:     Comments: Poor eye contact.  Answers questions appropriately.      No results found for any visits on 07/28/22.    The ASCVD Risk score (Arnett DK, et al., 2019) failed to calculate for the following reasons:   The 2019 ASCVD risk score is only valid for ages 57 to 31    Assessment & Plan:   Patient presents with few week history of vague symptoms including fatigue, poor appetite, epigastric abdominal pain.  His weight is actually stable compared to previous recent weights.  Recent EGD as above unremarkable.  Previous imaging unremarkable.  Nonfocal exam.  -We discussed checking some labs with CBC and chemistries but our lab was currently closed.  We did discuss returning for labs if his symptoms persist we will check CBC with differential, CMP, urinalysis  -He has had some chronic poor  appetite for months with difficulty gaining weight.  Has had GI workup as above.  He also describes some chronic sleep difficulties.  We discussed trial of low-dose mirtazapine 15 mg nightly. -Set up 1 month follow-up to reassess   Return in about 1 month (around 08/28/2022).    Carolann Littler, MD

## 2022-07-29 ENCOUNTER — Telehealth: Payer: Self-pay | Admitting: Family Medicine

## 2022-07-29 ENCOUNTER — Encounter: Payer: BC Managed Care – PPO | Admitting: Family Medicine

## 2022-07-29 ENCOUNTER — Telehealth: Payer: Self-pay | Admitting: Neurology

## 2022-07-29 DIAGNOSIS — R630 Anorexia: Secondary | ICD-10-CM

## 2022-07-29 DIAGNOSIS — R7989 Other specified abnormal findings of blood chemistry: Secondary | ICD-10-CM

## 2022-07-29 DIAGNOSIS — R5383 Other fatigue: Secondary | ICD-10-CM

## 2022-07-29 NOTE — Telephone Encounter (Addendum)
Pt called, returning CMA's call. CMA was unavailable.  Pt stated he was not aware that he had an appointment today for a CPE, and would not be able to make it.   Also, he says he'd like to know if MD could run some blood tests on him??   Please advise.

## 2022-07-29 NOTE — Telephone Encounter (Signed)
Pt's mother called in stating they tried to pick up his Topamax, but it turned out to be a different medication at the pharmacy. They were unaware he was being switched to a different medication. They would like some clarification. The pharmacy also does not have a prescription for Topamax.

## 2022-07-29 NOTE — Telephone Encounter (Signed)
I ordered some follow-up labs including CBC, CMP, 25-hydroxy vitamin D, and sed rate  Eulas Post MD [facility]

## 2022-07-29 NOTE — Telephone Encounter (Signed)
Pt called no answer left a voice mail to call the office back when he calls back we need to let him know that on 03/20/22 visit Dr Delice Lesch switch your medication  to extended-release Topiramate so you are just taking it at bedtime: take 2 capsules every night

## 2022-07-30 NOTE — Telephone Encounter (Signed)
Pt called informed that on  03/20/22 visit Dr Delice Lesch switch his medication  to extended-release Topiramate so he is  just taking it at bedtime: take 2 capsules every night  Pt stated that he never changed from the Topamax. Pt was advised that a PA was started an it takes up to 72 hr.

## 2022-07-30 NOTE — Telephone Encounter (Signed)
Pt called no answer left a voice mail to call the office back  °

## 2022-08-03 ENCOUNTER — Telehealth: Payer: Self-pay

## 2022-08-03 ENCOUNTER — Telehealth: Payer: Self-pay | Admitting: Pharmacy Technician

## 2022-08-03 ENCOUNTER — Other Ambulatory Visit: Payer: Self-pay

## 2022-08-03 ENCOUNTER — Encounter: Payer: Self-pay | Admitting: Neurology

## 2022-08-03 ENCOUNTER — Other Ambulatory Visit: Payer: Self-pay | Admitting: Family Medicine

## 2022-08-03 ENCOUNTER — Other Ambulatory Visit (HOSPITAL_COMMUNITY): Payer: Self-pay

## 2022-08-03 NOTE — Telephone Encounter (Signed)
Pt needs a PA on Topiramate ER (TROKENDI XR) 200 MG ASAP he is out of medication, paper request was sent last week. Pt and his mother is calling asking for an update?

## 2022-08-03 NOTE — Telephone Encounter (Signed)
Test billing results returned PA not needed. Was processed at retail level on 1.8.24. No PA needed.

## 2022-08-03 NOTE — Telephone Encounter (Signed)
Pt called informed that his medication has been approved he can pick it up from the pharmacy

## 2022-08-03 NOTE — Telephone Encounter (Signed)
Pt's mother Trae called stating her son has been out of medication for a several days, he needs a refill on clonazepam 0.5 mg and she was also questioning when his Trokendi will be ready for him to pick up.

## 2022-08-03 NOTE — Telephone Encounter (Signed)
Patient Advocate Encounter  Prior Authorization for TROKENDI XR '200MG'$  has been approved.    PA# [id number] Effective dates: 12.9.23 through 1.7.25   Received notification from [place] that prior authorization for TROKENDI XR '200MG'$  is required.   PA submitted on 1.8.24 Key [id number] Status is pending

## 2022-08-03 NOTE — Telephone Encounter (Signed)
Pt is out of medication an needs an ASAP PA on is clonazePAM (KLONOPIN) 0.5 MG tablet

## 2022-08-03 NOTE — Telephone Encounter (Signed)
Pt called informed that per PA team no PA needed for clonazepam he can pick that up from the pharmacy as well,

## 2022-08-03 NOTE — Telephone Encounter (Signed)
PA has been submitted and APPROVED. Can be filled at pharmacy level.

## 2022-08-04 ENCOUNTER — Inpatient Hospital Stay: Admission: RE | Admit: 2022-08-04 | Payer: BC Managed Care – PPO | Source: Ambulatory Visit

## 2022-08-04 ENCOUNTER — Other Ambulatory Visit: Payer: BC Managed Care – PPO

## 2022-08-05 MED ORDER — CLONAZEPAM 0.5 MG PO TABS: ORAL_TABLET | ORAL | 5 refills | Status: DC

## 2022-08-05 NOTE — Progress Notes (Signed)
Rx sent, thanks 

## 2022-08-07 ENCOUNTER — Other Ambulatory Visit: Payer: Self-pay | Admitting: Neurology

## 2022-08-07 ENCOUNTER — Other Ambulatory Visit (HOSPITAL_COMMUNITY): Payer: Self-pay

## 2022-08-07 ENCOUNTER — Telehealth: Payer: Self-pay | Admitting: Neurology

## 2022-08-07 MED ORDER — TOPAMAX 200 MG PO TABS: 200.0000 mg | ORAL_TABLET | Freq: Two times a day (BID) | ORAL | 3 refills | Status: DC

## 2022-08-07 NOTE — Telephone Encounter (Signed)
Pt mom called working on getting pt his medication

## 2022-08-07 NOTE — Telephone Encounter (Signed)
Pt's mother called in stating her pharmacy and several other pharmacies in the area are unable to get the topiramate right now. She is worried and is wondering if he can take the topamax until they can get the topiramate?

## 2022-08-10 ENCOUNTER — Telehealth: Payer: Self-pay

## 2022-08-10 NOTE — Telephone Encounter (Signed)
Pt needs a PA on   (TOPAMAX) 100 MG tablet [Pharmacy Med Name: TOPIRAMATE 100 MG TABLET]  TAKE 1 TABLET BY MOUTH TWICE A DAY

## 2022-08-12 NOTE — Telephone Encounter (Signed)
Message sent to PA team

## 2022-08-12 NOTE — Telephone Encounter (Signed)
Pt mother is calling to follow up on PA on Topamax

## 2022-08-12 NOTE — Telephone Encounter (Signed)
Pt's mother called back in. She is worried that her son has gone this long without taking any medication. She is afraid he is getting to the "danger zone". She would like a call to see what they can do. She is frustrated and would like to find out what Plan B is today.

## 2022-08-14 ENCOUNTER — Telehealth: Payer: Self-pay | Admitting: Neurology

## 2022-08-14 ENCOUNTER — Other Ambulatory Visit (HOSPITAL_COMMUNITY): Payer: Self-pay

## 2022-08-14 MED ORDER — TROKENDI XR 200 MG PO CP24: 2.0000 | ORAL_CAPSULE | Freq: Every day | ORAL | 0 refills | Status: DC

## 2022-08-14 MED ORDER — TROKENDI XR 200 MG PO CP24
2.0000 | ORAL_CAPSULE | Freq: Every day | ORAL | 4 refills | Status: DC
  Filled 2022-08-14 (×4): qty 60, 30d supply, fill #0

## 2022-08-14 MED ORDER — TROKENDI XR 200 MG PO CP24
2.0000 | ORAL_CAPSULE | Freq: Every day | ORAL | 4 refills | Status: DC
  Filled 2022-08-14: qty 60, 30d supply, fill #0

## 2022-08-14 NOTE — Telephone Encounter (Signed)
Pt called given the web site to do the trokindi discount card she is calling [facility] with the card number to see what the price will be,

## 2022-08-14 NOTE — Telephone Encounter (Signed)
Pt's mother called in stating the insurance company told her the Tokendi prior authorization expired on 08/03/22. She doesn't think they are correct, but would like a copy of the approval so she can have that in her hand when she is speaking with them. Her work fax is [phone].   She also asked if he could just get a prescription [facility] [address] [place] so he doesn't have to go another weekend without anything.  They also stated the Trokendi is not available through mail order.

## 2022-08-14 NOTE — Telephone Encounter (Signed)
Sript sent to [facility] they can do a half script to hold pt until Tuesday until [facility] can get medication in on Tuesday

## 2022-08-14 NOTE — Telephone Encounter (Signed)
Pt's mother called back in and left a message. She is upset she has not gotten a call back yet. She would like someone to give her a call before the end of the day today with any kind of update.

## 2022-08-14 NOTE — Addendum Note (Signed)
Addended by: Jake Seats on: 08/14/2022 04:19 PM   Modules accepted: Orders

## 2022-08-14 NOTE — Telephone Encounter (Signed)
PA on Topamax NEEDED ASAP pt has been out of medication for a while

## 2022-08-14 NOTE — Telephone Encounter (Signed)
[facility] pt pharmacy has the TROKENDI XR '200MG'$  pt has a PA that has been approved will send RX over so PT can pick it up. Pt and his mother are thankful, Dr Delice Lesch is aware,

## 2022-08-14 NOTE — Telephone Encounter (Signed)
Pt's mother called in stating they tried the copay card, but it wasn't working. She would like to just get the Topamax prescription [facility] [address] so he can get that at least for the weekend.   The pt has been seeing some signs of a seizure coming on. They have been giving him an extra Klonopin when that happens. She thinks it's been about 5 times now. She wanted [place] to know he might run out of that sooner than normal due to that.

## 2022-08-14 NOTE — Telephone Encounter (Signed)
Message sent to PA team for an update

## 2022-08-14 NOTE — Telephone Encounter (Signed)
Pt's mother called in and left a message. She is frustrated she has not heard anything back from [place] about his seizure medications. She would like to speak with someone today so he doesn't have to go the weekend without anything.

## 2022-08-17 ENCOUNTER — Other Ambulatory Visit (HOSPITAL_COMMUNITY): Payer: Self-pay

## 2022-08-18 ENCOUNTER — Other Ambulatory Visit (HOSPITAL_COMMUNITY): Payer: Self-pay

## 2022-08-19 ENCOUNTER — Telehealth: Payer: Self-pay | Admitting: Pharmacy Technician

## 2022-08-19 ENCOUNTER — Telehealth: Payer: Self-pay | Admitting: Anesthesiology

## 2022-08-19 ENCOUNTER — Other Ambulatory Visit (HOSPITAL_COMMUNITY): Payer: Self-pay

## 2022-08-19 ENCOUNTER — Telehealth: Payer: Self-pay

## 2022-08-19 MED ORDER — TOPAMAX 200 MG PO TABS: 200.0000 mg | ORAL_TABLET | Freq: Two times a day (BID) | ORAL | 3 refills | Status: DC

## 2022-08-19 NOTE — Telephone Encounter (Signed)
Pt needs an ASAP PA on Topamax '200mg'$   he has been out of medication for a while its for seizures,

## 2022-08-19 NOTE — Telephone Encounter (Signed)
Patient Advocate Encounter  Received notification from [place] that prior authorization for TOPAMAX '200MG'$  is required.   PA submitted on 1.24.24 Key [id number] Status is pending  Will need to call plan. Conflicting information from [facility] and when I test bill.

## 2022-08-19 NOTE — Telephone Encounter (Signed)
Pt's mother Trae called stating that she went to pick up the Trokendi medication from pharmacy she was told that people who have nut allergies can not take it, pt has a nut allergy. Pt's mother would like for Dr Delice Lesch to refill pt's Topamax 200 mg since he wont be able to take Trokendi.

## 2022-08-19 NOTE — Telephone Encounter (Signed)
Pt mother called informed that Rx sent for brand Topamax '200mg'$  twice a day. He needs to take it twice a day (he previously told me he was only taking it at night). Needs to take it BID. She was informed that PA has been started as well

## 2022-08-19 NOTE — Telephone Encounter (Signed)
Rx sent for brand Topamax '200mg'$  twice a day. He needs to take it twice a day (he previously told me he was only taking it at night). Needs to take it BID. Thanks

## 2022-08-21 ENCOUNTER — Inpatient Hospital Stay: Admission: RE | Admit: 2022-08-21 | Payer: BC Managed Care – PPO | Source: Ambulatory Visit

## 2022-08-21 ENCOUNTER — Other Ambulatory Visit: Payer: BC Managed Care – PPO

## 2022-08-25 ENCOUNTER — Telehealth: Payer: Self-pay | Admitting: Neurology

## 2022-08-25 NOTE — Telephone Encounter (Signed)
Pt's mother called in stating they got a prescription recently for Topamax, but the pharmacy gave them the generic version not the brand name. She is wanting to find out if the brand name can be sent in. She says the pt has seizures with the generic version.

## 2022-08-25 NOTE — Telephone Encounter (Signed)
Any updates on PA for topamax

## 2022-08-25 NOTE — Telephone Encounter (Signed)
Pt mother called no answer left a voice mail to call the office back

## 2022-08-25 NOTE — Telephone Encounter (Signed)
Pt mother called informed that we are waiting for an update from the PA team

## 2022-08-25 NOTE — Telephone Encounter (Signed)
Pt's mother called in and left a message returning Heather's call

## 2022-08-26 ENCOUNTER — Encounter: Payer: Self-pay | Admitting: Neurology

## 2022-08-26 ENCOUNTER — Other Ambulatory Visit (HOSPITAL_COMMUNITY): Payer: Self-pay

## 2022-08-26 NOTE — Telephone Encounter (Signed)
Patient Advocate Encounter Received notification from [employer] that prior authorization for TOPAMAX '200MG'$  is required.   PA submitted VIA PHONE PHONE: [phone] CASE ID: [id number]  Status is pending additional information from provider. Dr Delice Lesch will create letter to go along with chart notes to support. Will need to fax to [phone].      Although PA via CMM states PA not needed, pharmacy is till not able to process at [place] [facility] called insurance to see if override had to be completed. Joe reported that the said that product service not covered. Called back to [facility] [phone]) and initiated a PA via phone. It is pending until additional documentation to support pt has increase in seizures when on generic Topamax.

## 2022-08-26 NOTE — Telephone Encounter (Signed)
Letter faxed to [employer]

## 2022-08-28 ENCOUNTER — Other Ambulatory Visit (HOSPITAL_COMMUNITY): Payer: Self-pay

## 2022-08-28 NOTE — Telephone Encounter (Signed)
Pt called back informed that we are still waiting to hear back from insurance after sending the letter that name brand was needed, pt mother is going to call insurance and see what they are missing if his medication needs to be mail order? Or if more information is needed. They will either call or mychart back,

## 2022-08-28 NOTE — Telephone Encounter (Signed)
Pt called in and left a message. He is requesting to speak with [facility].

## 2022-08-28 NOTE — Telephone Encounter (Signed)
Pt's mother called in and left a message. She would like an update on the prior auth for the pt's Topamax.

## 2022-08-28 NOTE — Telephone Encounter (Signed)
Patient called in requesting to speak with Nira Conn again. Advised we got the message from earlier and that response time can be 24 hours.

## 2022-08-28 NOTE — Telephone Encounter (Signed)
Additional information has been submitted and received by the insurance. It has been escalated and is in the "pharmacy clinician work basket." However it still may take an additional 24-48 for it to be processed. Additional information provided that it will take no more than 13 days as that is when the case will close.

## 2022-08-31 ENCOUNTER — Ambulatory Visit: Payer: BC Managed Care – PPO | Admitting: Orthopedic Surgery

## 2022-08-31 NOTE — Telephone Encounter (Signed)
Pt mom stated that the generic doesn't help at all an that when he took the generic he had some signs of seizure, she stated that it does not work with the keppra an clonazepam. They stated that its no use in him taken it. They stated that when they talked to the insurance they told him it was 4 parts and he was in set 3 so they hope it will not take up to 13 days to get a yes or no.

## 2022-08-31 NOTE — Telephone Encounter (Signed)
Pt called in and left a message with the access nurse. Calling to check on the status of the prior authorization. Has been without medication for over a month.

## 2022-08-31 NOTE — Telephone Encounter (Signed)
Pls let mother know that last message you got from [facility] team from 08/28/2022 was that and that we have been working on this with patient and insurance for the past month and share her frustration. I know that he had issues with the generic Topiramate, but that may be better than him being without medication at all.

## 2022-08-31 NOTE — Telephone Encounter (Signed)
Patient called requesting information on his medication PA, he has not heard back from insurance or our office.

## 2022-09-02 ENCOUNTER — Telehealth: Payer: Self-pay | Admitting: Anesthesiology

## 2022-09-02 NOTE — Telephone Encounter (Signed)
Pt mother called stated that she talked to the insurance company they denied his Topamax and that he needs an appeal, [phone]. She said that they told her it needs to say what is in the medication that makes it where he can not take it,

## 2022-09-02 NOTE — Telephone Encounter (Signed)
Urgent appeal started on pt, case number [id number] turn around time is 09/05/22 by 3:55pm. We can check on updates by calling [phone].  Insurance stated that they got the letter that said he needed name brand and they still denied it, they may reach out for more information,

## 2022-09-02 NOTE — Telephone Encounter (Signed)
I have written a letter about this already, can you pls call the number and ask for the fax so we can send in appeal, or if there is a different number to send the appeal fax. Thanks

## 2022-09-02 NOTE — Telephone Encounter (Signed)
Pt's mother Trae called stating she had spoken with insurance company today, she was told that the reason they are denying pt's brand prescription for Topamax was because the doctor did not send the correct information they needed to approve it. Requests call back.

## 2022-09-02 NOTE — Telephone Encounter (Signed)
Pt mother called no answer left a voice mail to call the office back

## 2022-09-03 NOTE — Telephone Encounter (Signed)
Pt called informed that we received a letter that his topamax was approved from 08/19/22 until 09/03/23. [id number]

## 2022-09-04 ENCOUNTER — Encounter: Payer: Self-pay | Admitting: Orthopedic Surgery

## 2022-09-07 ENCOUNTER — Ambulatory Visit
Admission: RE | Admit: 2022-09-07 | Discharge: 2022-09-07 | Disposition: A | Discharge status: Home / Self Care | Payer: BC Managed Care – PPO | Source: Ambulatory Visit | Attending: Orthopedic Surgery | Admitting: Orthopedic Surgery

## 2022-09-07 DIAGNOSIS — M25511 Pain in right shoulder: Secondary | ICD-10-CM

## 2022-09-07 DIAGNOSIS — S46011A Strain of muscle(s) and tendon(s) of the rotator cuff of right shoulder, initial encounter: Secondary | ICD-10-CM | POA: Diagnosis not present

## 2022-09-07 DIAGNOSIS — Z9889 Other specified postprocedural states: Secondary | ICD-10-CM

## 2022-09-07 MED ORDER — IOPAMIDOL (ISOVUE-M 200) INJECTION 41%
15.0000 mL | Freq: Once | INTRAMUSCULAR | Status: AC
  Administered 2022-09-07: 15 mL via INTRA_ARTICULAR

## 2022-09-08 ENCOUNTER — Telehealth: Payer: Self-pay

## 2022-09-08 NOTE — Telephone Encounter (Signed)
Pt mother called stated that PA was not approved that they have a 1000.00 copay. PT mom was given the PA information she stated that she will call the pharmacy

## 2022-09-09 ENCOUNTER — Telehealth: Payer: Self-pay

## 2022-09-09 NOTE — Telephone Encounter (Signed)
He has been out of Topamax for over a month now, when was the last seizure? We can either add on a different medication or if he has not had any seizures, stay on what he has.

## 2022-09-09 NOTE — Telephone Encounter (Signed)
Pt mom called informed that to start  a different medication [name] needs to come into the office, pt mother transferred to the front to get him scheduled,

## 2022-09-09 NOTE — Telephone Encounter (Signed)
Pt mom stated that insurance dose not cover his topamax at all even with the PA the copay is 1000.00 she is asking what can they do?

## 2022-09-10 ENCOUNTER — Encounter: Payer: Self-pay | Admitting: Orthopedic Surgery

## 2022-09-10 ENCOUNTER — Ambulatory Visit: Payer: BC Managed Care – PPO | Admitting: Orthopedic Surgery

## 2022-09-10 DIAGNOSIS — S46011D Strain of muscle(s) and tendon(s) of the rotator cuff of right shoulder, subsequent encounter: Secondary | ICD-10-CM | POA: Diagnosis not present
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
## 2022-09-14 ENCOUNTER — Telehealth: Payer: Self-pay

## 2022-09-14 NOTE — Telephone Encounter (Signed)
Pls advise on patients questions.

## 2022-09-14 NOTE — Telephone Encounter (Signed)
-----   Message from Javier Glazier sent at 09/14/2022  3:56 PM EST ----- Regarding: patient has questions related to recovery period Patient would like to know when he will be able to drive after the right shoulder surgery with Dr. Marlou Sa on Thursday.    He states he is in school on Monday, Wednesday and Friday, so he may need to make arrangements for someone to drive him to school if he cannot drive himself.  Please call patient ASAP.  [phone]

## 2022-09-14 NOTE — Telephone Encounter (Signed)
4 - 6 weeks - I called - he will try to find someone to bring him      mom surgery same day

## 2022-09-16 ENCOUNTER — Encounter (HOSPITAL_COMMUNITY): Payer: Self-pay | Admitting: Orthopedic Surgery

## 2022-09-16 ENCOUNTER — Telehealth: Payer: Self-pay

## 2022-09-16 ENCOUNTER — Encounter: Payer: Self-pay | Admitting: Neurology

## 2022-09-16 ENCOUNTER — Other Ambulatory Visit: Payer: BC Managed Care – PPO

## 2022-09-16 ENCOUNTER — Ambulatory Visit: Payer: BC Managed Care – PPO | Admitting: Neurology

## 2022-09-16 VITALS — BP 101/68 | HR 58 | Ht 72.0 in | Wt 126.9 lb

## 2022-09-16 DIAGNOSIS — G40209 Localization-related (focal) (partial) symptomatic epilepsy and epileptic syndromes with complex partial seizures, not intractable, without status epilepticus: Secondary | ICD-10-CM

## 2022-09-16 MED ORDER — KEPPRA 1000 MG PO TABS: ORAL_TABLET | ORAL | 3 refills | Status: DC

## 2022-09-16 MED ORDER — CLONAZEPAM 0.5 MG PO TABS: ORAL_TABLET | ORAL | 5 refills | Status: DC

## 2022-09-16 NOTE — Progress Notes (Signed)
PCP - Dr Carolann Littler Cardiologist - n/a Neurology - Dr Sheppard Coil @ [facility]  Chest x-ray - 03/05/22 EKG - n/a Stress Test - n/a ECHO - n/a Cardiac Cath - n/a  ICD Pacemaker/Loop - n/a  Sleep Study -  n/a CPAP - none  Seizure - last seizure was in 11/2021  Diabetes -n/a   ERAS: Clear liquids til 4:30 am DOS.  Anesthesia review: Yes  STOP now taking any Aspirin (unless otherwise instructed by your surgeon), Celebrex, Aleve, Naproxen, Ibuprofen, Motrin, Advil, Goody's, BC's, all herbal medications, fish oil, and all vitamins.   Coronavirus Screening Do you have any of the following symptoms:  Cough yes/no: No Fever (>100.78F)  yes/no: No Runny nose yes/no: No Sore throat yes/no: No Difficulty breathing/shortness of breath  yes/no: No  Have you traveled in the last 14 days and where? yes/no: No  Patient verbalized understanding of instructions that were given via phone.

## 2022-09-16 NOTE — Anesthesia Preprocedure Evaluation (Addendum)
Anesthesia Evaluation  Patient identified by MRN, date of birth, ID band Patient awake    Reviewed: Allergy & Precautions, NPO status , Patient's Chart, lab work & pertinent test results  History of Anesthesia Complications (+) Family history of anesthesia reaction and history of anesthetic complications (mother with respiratory failure after anesthesia, reports needing more anesthesia with septoplasty)  Airway Mallampati: II  TM Distance: >3 FB Neck ROM: Full   Comment: Previous grade I view with Miller 3, easy mask Dental  (+) Teeth Intact, Dental Advisory Given   Pulmonary neg shortness of breath, asthma , neg sleep apnea, neg COPD, neg recent URI   Pulmonary exam normal breath sounds clear to auscultation       Cardiovascular negative cardio ROS  Rhythm:Regular Rate:Normal     Neuro/Psych  Headaches, Seizures - (on Keppra, last seizure 11/2021), Well Controlled,     GI/Hepatic Neg liver ROS,GERD (eosinophilic esophagitis)  ,,  Endo/Other  negative endocrine ROS    Renal/GU negative Renal ROS     Musculoskeletal   Abdominal   Peds  Hematology negative hematology ROS (+)   Anesthesia Other Findings Patient did not take Keppra this morning. He is allergic to generic Keppra. I'm told pharmacy does not have the brand name Keppra to give him his dose this morning.  Reproductive/Obstetrics                             Anesthesia Physical Anesthesia Plan  ASA: 2  Anesthesia Plan: General   Post-op Pain Management: Tylenol PO (pre-op)* and Regional block*   Induction: Intravenous  PONV Risk Score and Plan: 2 and Dexamethasone and Treatment may vary due to age or medical condition  Airway Management Planned: Oral ETT  Additional Equipment:   Intra-op Plan:   Post-operative Plan: Extubation in OR  Informed Consent: I have reviewed the patients History and Physical, chart, labs and  discussed the procedure including the risks, benefits and alternatives for the proposed anesthesia with the patient or authorized representative who has indicated his/her understanding and acceptance.     Dental advisory given  Plan Discussed with:   Anesthesia Plan Comments: (Risks of general anesthesia discussed including, but not limited to, sore throat, hoarse voice, chipped/damaged teeth, injury to vocal cords, nausea and vomiting, allergic reactions, lung infection, heart attack, stroke, and death. All questions answered.   PAT note written 09/16/2022 by Myra Gianotti, PA-C.  )       Anesthesia Quick Evaluation

## 2022-09-16 NOTE — Progress Notes (Signed)
NEUROLOGY FOLLOW UP OFFICE NOTE  Terrence Riley [id number] [birth year]  HISTORY OF PRESENT ILLNESS: I had the pleasure of seeing Terrence Riley in follow-up in the neurology clinic on 09/16/2022.  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 Terrence Riley is alone in the office today, Terrence Riley mother was on speakerphone. Records and images were personally reviewed where available.  On Terrence Riley last visit, Terrence Riley reported taking Keppra 1064m 2 tabs BID as instructed, however Keppra level on 03/20/22 was <2.0. Terrence Riley was supposed to take Topamax 2072mBID but reported taking only 20023mhs. Topiramate level in 02/2022 was 1.8 (ref 8-12 trough). Terrence Riley is also on clonazepam 0.5mg54ms. Since Terrence Riley last visit, we have been having a significantly difficult time getting brand Topamax approved by Terrence Riley insurance, despite letter written indicating Terrence Riley had more seizures on generic Topiramate and it was ineffective compared to brand Topamax. Terrence Riley has been out of Topamax for over a month. Thankfully Terrence Riley has not had any seizures. Terrence Riley mother denies any staring/unresponsive episodes. There were a few times where Terrence Riley was reporting "signs of a seizure" where Terrence Riley would feel lightheaded and ears start ringing, and would take an extra clonazepam with good response. Terrence Riley states aside from last month when Terrence Riley had to take it more when off Topamax, Terrence Riley does not take extra dose very often. Terrence Riley denies any headaches, dizziness, focal numbness/tingling/weakness. Terrence Riley PCP started him on mirtazapine for appetite and sleep, which has helped. Terrence Riley mother is proud to report that Terrence Riley has been doing so well in school, nominated for the [employer] History on Initial Assessment 04/16/2021: This is a 22 y66r old right-handed man with a history of co-existing Terrence and psychogenic non-epileptic events, depression, presenting to establish care. Terrence Riley is a poor historian, [facility]. Terrence Riley states seizures started when Terrence Riley was a baby, notes indicate seizure started in infancy. Terrence Riley does not recall when Terrence Riley last seizure was, Terrence Riley does not remember being in the ER 3 months ago for seizure. This year, Terrence Riley was in the ER in 08/2020 for a nocturnal seizure where Terrence Riley sustained a left eyelid laceration. Terrence Riley admitted to missing 3 doses of Keppra. Terrence Riley was in the ER on 01/14/21 for dizziness/chest burning, when Terrence Riley had a witnessed episode of eye twitching, unresponsive with barely perceptible bilateral upper extremity twitching that lasted at least 20 minutes, given 3mg 46mAtivan and IV Keppra. Terrence Riley was back in the ER on 01/16/21 when Terrence Riley had a GTC [place] the mall lasting 2 minutes, post-ictal on EMS arrival. Terrence Riley states Terrence Riley gets lightheaded and cannot hear, then wakes up in the ambulance. Terrence Riley feels weird and sore, Terrence Riley denies any tongue bite or incontinence. Terrence Riley denies any staring episodes, however [facility] where Terrence Riley is staring, unresponsive with right face, arm, leg jerking. Terrence Riley also has a history of seizures with face contortion, stiffening, head jerks to the left. One time Terrence Riley spun in a complete circle then crumpled to the floor with jerking, head turned to the left. Brain MRI in 2011 was normal. Terrence Riley had a 24-hour EEG in 11/2012 where push button episodes for reduced responses showed normal EEG. Baseline EEG was abnormal due to frequent left frontal/temporal delta slowing and occasional independent right temporal delta slowing, no epileptiform discharges. Terrence Riley had an EMU admission for 3 days (07/26/13 to 07/29/13) where episode of  staring spell felt likely non-epileptic. Baseline EEG was abnormal with "increased epileptogenic potential in the right central/parietal area, increased epileptogenic potential with a generalized mechanism of onset." Terrence Riley is listed as taking Topiramate 240m BID but only takes 2026mqhs, Levetiracetam 100052mID (taking 2000m51ms), and clonazepam 1mg 66mD. Per notes, clonazepam 2 pills daily was started by Terrence Riley pediatric neurologist "meant to be a bridge according to notes dating back to 2014." Terrence Riley denies missing medications, and denies any side effects. Terrence Riley has not noticed any triggers to Terrence Riley seizures. Terrence Riley denies any olfactory/gustatory hallucinations, deja vu, rising epigastric sensation, focal numbness/tingling/weakness, myoclonic jerks. Terrence Riley has neck pain and right shoulder pain with shoulder surgery scheduled next month. Terrence Riley denies any headaches, dizziness, vision changes, bowel/bladder dysfunction, no falls. Terrence Riley lives with Terrence Riley mother who has not mentioned staring spells recently. Terrence Riley usually gets 4-5 hours of sleep and takes naps. 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 Mood is fine.   Terrence Riley:  Terrence Riley was born premature. Terrence Riley mother is treated with seizure medication but unclear if she has Terrence. Terrence Riley had a normal birth and early development.  There is no history of febrile convulsions, CNS infections such as meningitis/encephalitis, significant traumatic brain injury, neurosurgical procedures.  Prior ASMs: Keppra, Trileptal, Tegretol, Carbatrol; incomplete control on monotherapy. More seizures on generic Levetiracetam and generic Topiramate   PAST MEDICAL HISTORY: Past Medical History:  Diagnosis Date   Allergy    rhinitis   Asthma    as a child   Complication of anesthesia    pt. reports Terrence Riley need more anesthesia with the septoplasty surgery   Eosinophilic esophagitis    heartburn and reflux   Family history of adverse reaction to anesthesia    mother respiratory failure   Headache    Nasal fracture    deviated septum   Pneumonia    1x history of   Premature baby    2 months early   Seizures ([facility]) [place] controlled on meds/ one 2 months ago when meds were late 11/2021    MEDICATIONS: Current Outpatient Medications on File Prior to Visit  Medication Sig Dispense Refill   celecoxib (CELEBREX) 100  MG capsule TAKE 1 CAPSULE BY MOUTH TWICE A DAY 60 capsule 0   clonazePAM (KLONOPIN) 0.5 MG tablet Take 1 tablet every night 30 tablet 5   cyanocobalamin (,VITAMIN B-12,) 1000 MCG/ML injection Inject 1 mL (1,000 mcg total) into the muscle every 30 (thirty) days. 1 mL 0   EPINEPHrine 0.3 mg/0.3 mL IJ SOAJ injection Inject 0.3 mg into the muscle as needed for anaphylaxis. 1 each 0   ibuprofen (ADVIL) 200 MG tablet Take 800 mg by mouth every 6 (six) hours as needed for moderate pain.     KEPPRA 1000 MG tablet Take 2 tablets (2,000 mg total) by mouth 2 (two) times daily. (Patient taking differently: Take 2,000 mg by mouth at bedtime.) 120 tablet 3   mirtazapine (REMERON SOL-TAB) 15 MG disintegrating tablet Take 1 tablet (15 mg total) by mouth at bedtime. (Patient taking differently: Take 15 mg by mouth at bedtime as needed (decreased appitite).) 30 tablet 0   lidocaine (LIDODERM) 5 % Place 1 patch onto the skin daily as needed (pain). Remove & Discard patch within 12 hours or as directed by MD (Patient not taking: Reported on 09/16/2022)     TOPAMAX 200 MG tablet Take 1 tablet (200 mg total) by mouth 2 (two)  times daily. (Patient not taking: Reported on 09/16/2022) 180 tablet 3   No current facility-administered medications on file prior to visit.    ALLERGIES: Allergies  Allergen Reactions   Lortab [Hydrocodone-Acetaminophen] Hives   Other Anaphylaxis    Peanuts   Zofran [Ondansetron Hcl] Nausea And Vomiting   Citrus Other (See Comments)    Sores in mouth   Dairy Aid [Tilactase] Nausea And Vomiting   Influenza Virus Vaccine Other (See Comments)    Partial paralysis for a couple of weeks per mom   Soy Allergy Other (See Comments)    Seizures     FAMILY HISTORY: Family History  Problem Relation Age of Onset   Cancer Mother        breat   Clotting disorder Mother    Protein C deficiency Sister    Stomach cancer Neg Hx    Esophageal cancer Neg Hx    Colon cancer Neg Hx     SOCIAL  HISTORY: Social History   Socioeconomic History   Marital status: Single    Spouse name: Not on file   Number of children: 0   Years of education: Not on file   Highest education level: Not on file  Occupational History   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  Other Topics Concern   Not on file  Social History Narrative   Right handed   Lives at home with mom goes to college   Social Determinants of Health   Financial Resource Strain: Not on file  Food Insecurity: Not on file  Transportation Needs: Not on file  Physical Activity: Not on file  Stress: Not on file  Social Connections: Not on file  Intimate Partner Violence: Not on file     PHYSICAL EXAM: Vitals:   09/16/22 0915  BP: 101/68  Pulse: (!) 58  SpO2: 100%   General: No acute distress Head:  Normocephalic/atraumatic Skin/Extremities: No rash, no edema Neurological Exam: alert and awake. No aphasia or dysarthria. Fund of knowledge is appropriate. Attention and concentration are normal.   Cranial nerves: Pupils equal, round. Extraocular movements intact with no nystagmus. Visual fields full.  No facial asymmetry.  Motor: Bulk and tone normal, muscle strength 5/5 throughout with no pronator drift.   Finger to nose testing intact.  Gait narrow-based and steady, able to tandem walk adequately.  Romberg negative.   IMPRESSION: This is a 22 yo RH man with a history of depression, with co-existing Terrence and psychogenic non-epileptic events. Prior EEG reported bilateral temporal slowing, epileptogenic potential in the right central/parietal area, and generalized mechanism. EEGs in the past showed normal EEG during episodes of staring/decreased responsiveness. MRI brain normal. Terrence Riley has been off brand Topamax for over a month with  thankfully no seizure recurrence. Last known seizure was 01/08/22. We agreed to monitor symptoms on brand Keppra 10846m in AM, 20042min PM and clonazepam 0.46m36mhs, if seizures recur, consider adding on Aptiom. Terrence Riley mother reports Terrence Riley takes Keppra 1 in AM, 2 in PM. We discussed checking a Keppra level (since last level in 02/2022 was undetectable). Terrence Riley reported that Terrence Riley did not take the AM dose this morning. Proceed with Keppra level today. Terrence Riley is aware of [place] driving laws to stop driving after a seizure until  6 months seizure-free. Follow-up in 4-5 months, call for any changes.    Thank you for allowing me to participate in Terrence Riley care.  Please do not hesitate to call for any questions or concerns.    Ellouise Newer, M.D.   CC: Dr. Elease Hashimoto
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
## 2022-09-16 NOTE — Progress Notes (Signed)
Anesthesia Chart Review: Kathleene Hazel  Case: [id number] Date/Time: 09/17/22 0715   Procedures:      RIGHT SHOULDER ARTHROSCOPY, SUBSCAPULARIS REPAIR (Right: Shoulder)     POSSIBLE HARDWARE REMOVAL (Right: Shoulder) - PROVIDER REQUESTING 2.0 HOURS OPERATING TIME   Anesthesia type: General   Pre-op diagnosis: right shoulder subscapularis tear, retained hardware   Location: [facility] OR ROOM 07 / [place] [facility]   Surgeons: [name], MD       DISCUSSION: Patient is a 22 year old male scheduled for the above procedure.  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  He saw his neurologist Dr. Delice Lesch on 09/16/22. He had been of topiramate for at least month due to history of more seizures on generic topiramate, and insurance kept denying brand name Topamax. Thankfully, no new seizures, last was in June 2023. He did have one episode where he felt light headed with tinnitus and took and extra clonazepam, and did not have a seizure. He remains on brand name Keppra. Mirtazapine recently added by PCP for appetite and sleep. They agreed to not restart topiramate and instead continue to monitor symptoms on Keppra 1000 mg 1 Q AM and 2 Q PM and clonazepam 0.5 mg Q HS. If any increase in his seizures activity could consider adding Aptiom. Keppra level planned, but is still pending.    Anesthesia team to evaluate on the day of surgery.   VS:  BP Readings from Last 3 Encounters:  09/16/22 101/68  07/28/22 100/60  05/18/22 118/71   Pulse Readings from Last 3 Encounters:  09/16/22 (!) 58  07/28/22 (!) 56  05/18/22 (!) 50     PROVIDERS: Eulas Post, MD is PCP  Ellouise Newer, MD is neurologist   LABS: For day of surgery as indicated.    IMAGES: MRI Right Shoulder 09/07/22: IMPRESSION: 1. Severe tendinosis with full-thickness, incomplete tearing of  the distal subscapularis tendon. The distal tendon is significantly attenuated. No retracted component. 2. Nonvisualization of the long head biceps tendon which may be torn and retracted or reflect sequela of prior biceps tenotomy or tenodesis. 3. Interval osteochondral allografting of humeral head defect which appears well incorporated with improved humeral head contour. Interval fixation of the posteroinferior glenoid, well-healed. 4. Capacious posterior joint capsule.      EKG: He has had Sinus rhythm with ST elevation, probably normal early repolarization patter on previous EKGs. Last tracing 03/05/22.    CV: N/A  Past Medical History:  Diagnosis Date   Allergy    rhinitis   Asthma    as a child   Complication of anesthesia    pt. reports he need more anesthesia with the septoplasty surgery   Eosinophilic esophagitis    heartburn and reflux   Family history of adverse reaction to anesthesia    mother respiratory failure   Headache    Nasal fracture    deviated septum   Pneumonia    1x history of   Premature baby    2 months early   Seizures ([place])    well controlled on meds/ one 2 months ago when meds were late 11/2021    Past Surgical History:  Procedure Laterality Date   ADENOIDECTOMY     CLOSED REDUCTION NASAL FRACTURE N/A 08/31/2017   Procedure: CLOSED REDUCTION NASAL FRACTURE;  Surgeon: Clyde Canterbury, MD;  Location: [place];  Service: ENT;  Laterality: N/A;   ORIF HUMERUS FRACTURE  Right 05/05/2021   Procedure: reverse Hill-Sachs allografting, biceps tenodesis;  Surgeon: [name], MD;  Location: [place];  Service: Orthopedics;  Laterality: Right;   SEPTOPLASTY N/A 08/31/2017   Procedure: SEPTOPLASTY;  Surgeon: Clyde Canterbury, MD;  Location: [place];  Service: ENT;  Laterality: N/A;   SHOULDER ARTHROSCOPY WITH LABRAL REPAIR Left 04/30/2020   Procedure: LEFT SHOULDER POSTERIOR LABRAL REPAIR WITH ARTHROSCOPY, BICEPS TENDON RELEASE  AND TENODESIS, OPEN ALLOGRAFT FOR REVERSE BANKART LESION;  Surgeon: [name], MD;  Location: [place];  Service: Orthopedics;  Laterality: Left;   SHOULDER ARTHROSCOPY WITH LABRAL REPAIR Right 05/05/2021   Procedure: right shoulder arthroscopy, biceps release, posterior labral repair;;  Surgeon: [name], MD;  Location: [place];  Service: Orthopedics;  Laterality: Right;   TONSILLECTOMY     and addenoids   UPPER GASTROINTESTINAL ENDOSCOPY  05/15/2022    MEDICATIONS: No current facility-administered medications for this encounter.    cyanocobalamin (,VITAMIN B-12,) 1000 MCG/ML injection   EPINEPHrine 0.3 mg/0.3 mL IJ SOAJ injection   ibuprofen (ADVIL) 200 MG tablet   lidocaine (LIDODERM) 5 %   mirtazapine (REMERON SOL-TAB) 15 MG disintegrating tablet   celecoxib (CELEBREX) 100 MG capsule   clonazePAM (KLONOPIN) 0.5 MG tablet   KEPPRA 1000 MG tablet     Myra Gianotti, PA-C Surgical Short Stay/Anesthesiology [facility] Phone [phone] [facility] Phone [phone] 09/16/2022 12:04 PM

## 2022-09-16 NOTE — Telephone Encounter (Signed)
Lab is needing [place] to place orders, told patient they couldn't see anything. Terrence Riley asked for a call back once placed and will schedule blood work at a later time.

## 2022-09-16 NOTE — Telephone Encounter (Signed)
Lab called pt phone for him to come back  in he never came in the lab an was not turned away

## 2022-09-17 ENCOUNTER — Ambulatory Visit (HOSPITAL_COMMUNITY): Payer: BC Managed Care – PPO

## 2022-09-17 ENCOUNTER — Other Ambulatory Visit: Payer: Self-pay

## 2022-09-17 ENCOUNTER — Other Ambulatory Visit: Payer: Self-pay | Admitting: Orthopedic Surgery

## 2022-09-17 ENCOUNTER — Encounter (HOSPITAL_COMMUNITY): Payer: Self-pay | Admitting: Orthopedic Surgery

## 2022-09-17 ENCOUNTER — Ambulatory Visit (HOSPITAL_COMMUNITY): Payer: BC Managed Care – PPO | Admitting: Vascular Surgery

## 2022-09-17 ENCOUNTER — Encounter (HOSPITAL_COMMUNITY)
Admission: RE | Disposition: A | Discharge status: Home / Self Care | Payer: Self-pay | Source: Home / Self Care | Attending: Orthopedic Surgery

## 2022-09-17 ENCOUNTER — Ambulatory Visit (HOSPITAL_COMMUNITY)
Admission: RE | Admit: 2022-09-17 | Discharge: 2022-09-17 | Disposition: A | Discharge status: Home / Self Care | Payer: BC Managed Care – PPO | Attending: Orthopedic Surgery | Admitting: Orthopedic Surgery

## 2022-09-17 DIAGNOSIS — M898X9 Other specified disorders of bone, unspecified site: Secondary | ICD-10-CM | POA: Diagnosis not present

## 2022-09-17 DIAGNOSIS — Z0389 Encounter for observation for other suspected diseases and conditions ruled out: Secondary | ICD-10-CM | POA: Diagnosis not present

## 2022-09-17 DIAGNOSIS — Z653 Problems related to other legal circumstances: Secondary | ICD-10-CM | POA: Diagnosis not present

## 2022-09-17 DIAGNOSIS — S46011A Strain of muscle(s) and tendon(s) of the rotator cuff of right shoulder, initial encounter: Secondary | ICD-10-CM

## 2022-09-17 DIAGNOSIS — X58XXXA Exposure to other specified factors, initial encounter: Secondary | ICD-10-CM | POA: Insufficient documentation

## 2022-09-17 DIAGNOSIS — G8918 Other acute postprocedural pain: Secondary | ICD-10-CM | POA: Diagnosis not present

## 2022-09-17 DIAGNOSIS — K219 Gastro-esophageal reflux disease without esophagitis: Secondary | ICD-10-CM | POA: Insufficient documentation

## 2022-09-17 DIAGNOSIS — Z888 Allergy status to other drugs, medicaments and biological substances status: Secondary | ICD-10-CM | POA: Insufficient documentation

## 2022-09-17 DIAGNOSIS — M61511 Other ossification of muscle, right shoulder: Secondary | ICD-10-CM | POA: Diagnosis not present

## 2022-09-17 DIAGNOSIS — J45909 Unspecified asthma, uncomplicated: Secondary | ICD-10-CM | POA: Insufficient documentation

## 2022-09-17 DIAGNOSIS — Z79899 Other long term (current) drug therapy: Secondary | ICD-10-CM | POA: Diagnosis not present

## 2022-09-17 DIAGNOSIS — M24111 Other articular cartilage disorders, right shoulder: Secondary | ICD-10-CM | POA: Diagnosis present

## 2022-09-17 DIAGNOSIS — S46012A Strain of muscle(s) and tendon(s) of the rotator cuff of left shoulder, initial encounter: Secondary | ICD-10-CM

## 2022-09-17 DIAGNOSIS — G40909 Epilepsy, unspecified, not intractable, without status epilepticus: Secondary | ICD-10-CM | POA: Diagnosis not present

## 2022-09-17 HISTORY — PX: HARDWARE REMOVAL: SHX979

## 2022-09-17 HISTORY — PX: SHOULDER ARTHROSCOPY WITH SUBACROMIAL DECOMPRESSION AND BICEP TENDON REPAIR: SHX5689

## 2022-09-17 LAB — CBC
HCT: 39.7 % (ref 39.0–52.0)
Hemoglobin: 13.2 g/dL (ref 13.0–17.0)
MCH: 29.2 pg (ref 26.0–34.0)
MCHC: 33.2 g/dL (ref 30.0–36.0)
MCV: 87.8 fL (ref 80.0–100.0)
Platelets: 153 10*3/uL (ref 150–400)
RBC: 4.52 MIL/uL (ref 4.22–5.81)
RDW: 11.9 % (ref 11.5–15.5)
WBC: 4.1 10*3/uL (ref 4.0–10.5)
nRBC: 0 % (ref 0.0–0.2)

## 2022-09-17 LAB — BASIC METABOLIC PANEL
Anion gap: 7 (ref 5–15)
BUN: 7 mg/dL (ref 6–20)
CO2: 29 mmol/L (ref 22–32)
Calcium: 8.9 mg/dL (ref 8.9–10.3)
Chloride: 104 mmol/L (ref 98–111)
Creatinine, Ser: 1.07 mg/dL (ref 0.61–1.24)
GFR, Estimated: >60 mL/min (ref >60)
Glucose, Bld: 81 mg/dL (ref 70–99)
Potassium: 3.9 mmol/L (ref 3.5–5.1)
Sodium: 140 mmol/L (ref 135–145)

## 2022-09-17 SURGERY — SHOULDER ARTHROSCOPY WITH SUBACROMIAL DECOMPRESSION AND BICEP TENDON REPAIR
Anesthesia: General | Site: Shoulder | Laterality: Right

## 2022-09-17 MED ORDER — CELECOXIB 100 MG PO CAPS: 100.0000 mg | ORAL_CAPSULE | Freq: Two times a day (BID) | ORAL | 2 refills | Status: DC

## 2022-09-17 MED ORDER — SUGAMMADEX SODIUM 200 MG/2ML IV SOLN
INTRAVENOUS | Status: DC | PRN
  Administered 2022-09-17: 120 mg via INTRAVENOUS

## 2022-09-17 MED ORDER — OXYCODONE HCL 5 MG/5ML PO SOLN: 5.0000 mg | Freq: Once | ORAL | Status: DC | PRN

## 2022-09-17 MED ORDER — KETOROLAC TROMETHAMINE 30 MG/ML IJ SOLN
INTRAMUSCULAR | Status: AC
  Filled 2022-09-17: qty 1

## 2022-09-17 MED ORDER — METHOCARBAMOL 500 MG PO TABS: 500.0000 mg | ORAL_TABLET | Freq: Three times a day (TID) | ORAL | 0 refills | Status: DC | PRN

## 2022-09-17 MED ORDER — PHENYLEPHRINE 80 MCG/ML (10ML) SYRINGE FOR IV PUSH (FOR BLOOD PRESSURE SUPPORT)
PREFILLED_SYRINGE | INTRAVENOUS | Status: DC | PRN
  Administered 2022-09-17: 80 ug via INTRAVENOUS

## 2022-09-17 MED ORDER — DEXAMETHASONE SODIUM PHOSPHATE 10 MG/ML IJ SOLN
INTRAMUSCULAR | Status: AC
  Filled 2022-09-17: qty 1

## 2022-09-17 MED ORDER — SODIUM CHLORIDE 0.9 % IR SOLN
Status: DC | PRN
  Administered 2022-09-17: 3001 mL

## 2022-09-17 MED ORDER — FENTANYL CITRATE (PF) 100 MCG/2ML IJ SOLN
INTRAMUSCULAR | Status: DC | PRN
  Administered 2022-09-17: 100 ug via INTRAVENOUS
  Administered 2022-09-17: 50 ug via INTRAVENOUS

## 2022-09-17 MED ORDER — MIDAZOLAM HCL 2 MG/2ML IJ SOLN
INTRAMUSCULAR | Status: AC
  Filled 2022-09-17: qty 2

## 2022-09-17 MED ORDER — OXYCODONE HCL 5 MG PO TABS: 5.0000 mg | ORAL_TABLET | ORAL | 0 refills | Status: DC | PRN

## 2022-09-17 MED ORDER — EPINEPHRINE PF 1 MG/ML IJ SOLN
INTRAMUSCULAR | Status: AC
  Filled 2022-09-17: qty 2

## 2022-09-17 MED ORDER — BUPIVACAINE HCL (PF) 0.5 % IJ SOLN
INTRAMUSCULAR | Status: DC | PRN
  Administered 2022-09-17: 10 mL via PERINEURAL

## 2022-09-17 MED ORDER — LIDOCAINE 2% (20 MG/ML) 5 ML SYRINGE
INTRAMUSCULAR | Status: DC | PRN
  Administered 2022-09-17: 20 mg via INTRAVENOUS

## 2022-09-17 MED ORDER — VANCOMYCIN HCL 1000 MG IV SOLR
INTRAVENOUS | Status: AC
  Filled 2022-09-17: qty 20

## 2022-09-17 MED ORDER — SODIUM CHLORIDE 0.9 % IR SOLN
Status: DC | PRN
  Administered 2022-09-17: 3000 mL

## 2022-09-17 MED ORDER — ROCURONIUM BROMIDE 10 MG/ML (PF) SYRINGE
PREFILLED_SYRINGE | INTRAVENOUS | Status: AC
  Filled 2022-09-17: qty 10

## 2022-09-17 MED ORDER — BUPIVACAINE LIPOSOME 1.3 % IJ SUSP
INTRAMUSCULAR | Status: DC | PRN
  Administered 2022-09-17: 10 mL via PERINEURAL

## 2022-09-17 MED ORDER — DEXAMETHASONE SODIUM PHOSPHATE 10 MG/ML IJ SOLN
INTRAMUSCULAR | Status: DC | PRN
  Administered 2022-09-17: 8 mg via INTRAVENOUS

## 2022-09-17 MED ORDER — CLONIDINE HCL (ANALGESIA) 100 MCG/ML EP SOLN
EPIDURAL | Status: AC
  Filled 2022-09-17: qty 10

## 2022-09-17 MED ORDER — OXYCODONE HCL 5 MG PO CAPS: 5.0000 mg | ORAL_CAPSULE | ORAL | 0 refills | Status: DC | PRN

## 2022-09-17 MED ORDER — CHLORHEXIDINE GLUCONATE 0.12 % MT SOLN
15.0000 mL | Freq: Once | OROMUCOSAL | Status: AC
  Administered 2022-09-17: 15 mL via OROMUCOSAL
  Filled 2022-09-17: qty 15

## 2022-09-17 MED ORDER — OXYCODONE HCL 5 MG PO TABS: 5.0000 mg | ORAL_TABLET | Freq: Once | ORAL | Status: DC | PRN

## 2022-09-17 MED ORDER — TRANEXAMIC ACID-NACL 1000-0.7 MG/100ML-% IV SOLN
INTRAVENOUS | Status: AC
  Filled 2022-09-17: qty 100

## 2022-09-17 MED ORDER — MORPHINE SULFATE 4 MG/ML IJ SOLN: INTRAMUSCULAR | Status: DC | PRN

## 2022-09-17 MED ORDER — MORPHINE SULFATE (PF) 4 MG/ML IV SOLN
INTRAVENOUS | Status: AC
  Filled 2022-09-17: qty 2

## 2022-09-17 MED ORDER — ACETAMINOPHEN 500 MG PO TABS
1000.0000 mg | ORAL_TABLET | Freq: Once | ORAL | Status: AC
  Administered 2022-09-17: 1000 mg via ORAL
  Filled 2022-09-17: qty 2

## 2022-09-17 MED ORDER — 0.9 % SODIUM CHLORIDE (POUR BTL) OPTIME
TOPICAL | Status: DC | PRN
  Administered 2022-09-17: 1000 mL

## 2022-09-17 MED ORDER — ROCURONIUM BROMIDE 10 MG/ML (PF) SYRINGE
PREFILLED_SYRINGE | INTRAVENOUS | Status: DC | PRN
  Administered 2022-09-17: 40 mg via INTRAVENOUS

## 2022-09-17 MED ORDER — VANCOMYCIN HCL 1000 MG IV SOLR
INTRAVENOUS | Status: DC | PRN
  Administered 2022-09-17: 1000 mg

## 2022-09-17 MED ORDER — TRANEXAMIC ACID-NACL 1000-0.7 MG/100ML-% IV SOLN
INTRAVENOUS | Status: DC | PRN
  Administered 2022-09-17: 1000 mg via INTRAVENOUS

## 2022-09-17 MED ORDER — PROMETHAZINE HCL 25 MG/ML IJ SOLN: 6.2500 mg | INTRAMUSCULAR | Status: DC | PRN

## 2022-09-17 MED ORDER — CELECOXIB 100 MG PO CAPS: 100.0000 mg | ORAL_CAPSULE | Freq: Two times a day (BID) | ORAL | 0 refills | Status: DC

## 2022-09-17 MED ORDER — KETOROLAC TROMETHAMINE 30 MG/ML IJ SOLN
INTRAMUSCULAR | Status: DC | PRN
  Administered 2022-09-17: 30 mg via INTRAVENOUS

## 2022-09-17 MED ORDER — LIDOCAINE 2% (20 MG/ML) 5 ML SYRINGE
INTRAMUSCULAR | Status: AC
  Filled 2022-09-17: qty 5

## 2022-09-17 MED ORDER — FENTANYL CITRATE (PF) 250 MCG/5ML IJ SOLN
INTRAMUSCULAR | Status: AC
  Filled 2022-09-17: qty 5

## 2022-09-17 MED ORDER — PROPOFOL 10 MG/ML IV BOLUS
INTRAVENOUS | Status: AC
  Filled 2022-09-17: qty 20

## 2022-09-17 MED ORDER — ORAL CARE MOUTH RINSE: 15.0000 mL | Freq: Once | OROMUCOSAL | Status: AC

## 2022-09-17 MED ORDER — LACTATED RINGERS IV SOLN: INTRAVENOUS | Status: DC

## 2022-09-17 MED ORDER — MIDAZOLAM HCL 2 MG/2ML IJ SOLN
INTRAMUSCULAR | Status: DC | PRN
  Administered 2022-09-17: 2 mg via INTRAVENOUS

## 2022-09-17 MED ORDER — PROPOFOL 10 MG/ML IV BOLUS
INTRAVENOUS | Status: DC | PRN
  Administered 2022-09-17: 130 mg via INTRAVENOUS

## 2022-09-17 MED ORDER — SODIUM CHLORIDE (PF) 0.9 % IJ SOLN
INTRAMUSCULAR | Status: DC | PRN
  Administered 2022-09-17: 20 mL

## 2022-09-17 MED ORDER — FENTANYL CITRATE (PF) 100 MCG/2ML IJ SOLN: 25.0000 ug | INTRAMUSCULAR | Status: DC | PRN

## 2022-09-17 MED ORDER — CEFAZOLIN SODIUM-DEXTROSE 2-4 GM/100ML-% IV SOLN
2.0000 g | INTRAVENOUS | Status: AC
  Administered 2022-09-17: 2 g via INTRAVENOUS
  Filled 2022-09-17: qty 100

## 2022-09-17 MED ORDER — BUPIVACAINE HCL (PF) 0.25 % IJ SOLN
INTRAMUSCULAR | Status: AC
  Filled 2022-09-17: qty 30

## 2022-09-17 MED ORDER — PHENYLEPHRINE HCL-NACL 20-0.9 MG/250ML-% IV SOLN
INTRAVENOUS | Status: DC | PRN
  Administered 2022-09-17: 15 ug/min via INTRAVENOUS

## 2022-09-17 SURGICAL SUPPLY — 106 items
ANCH SUT 2 SWLK 19.1 CLS EYLT (Anchor) ×2 IMPLANT
ANCH SUT FBRTK 1.3 2 TPE (Anchor) ×2 IMPLANT
ANCHOR FBRTK 2.6 SUTURETAP 1.3 (Anchor) IMPLANT
ANCHOR SWIVELOCK BIO 4.75X19.1 (Anchor) IMPLANT
BAG COUNTER SPONGE SURGICOUNT (BAG) ×1 IMPLANT
BAG SPNG CNTER NS LX DISP (BAG) ×1
BANDAGE ESMARK 6X9 LF (GAUZE/BANDAGES/DRESSINGS) IMPLANT
BLADE CUTTER GATOR 3.5 (BLADE) IMPLANT
BLADE EXCALIBUR 4.0X13 (MISCELLANEOUS) IMPLANT
BLADE SHAVER TORPEDO 4X13 (MISCELLANEOUS) IMPLANT
BLADE SURG 11 STRL SS (BLADE) IMPLANT
BLADE SURG 15 STRL LF DISP TIS (BLADE) IMPLANT
BLADE SURG 15 STRL SS (BLADE) ×1
BNDG CMPR 9X6 STRL LF SNTH (GAUZE/BANDAGES/DRESSINGS)
BNDG COHESIVE 4X5 TAN STRL (GAUZE/BANDAGES/DRESSINGS) IMPLANT
BNDG ELASTIC 4X5.8 VLCR STR LF (GAUZE/BANDAGES/DRESSINGS) IMPLANT
BNDG ELASTIC 6X5.8 VLCR STR LF (GAUZE/BANDAGES/DRESSINGS) IMPLANT
BNDG ESMARK 6X9 LF (GAUZE/BANDAGES/DRESSINGS)
BNDG GAUZE DERMACEA FLUFF 4 (GAUZE/BANDAGES/DRESSINGS) ×1 IMPLANT
BNDG GZE DERMACEA 4 6PLY (GAUZE/BANDAGES/DRESSINGS) ×1
BUR OVAL 6.0 (BURR) IMPLANT
CLSR STERI-STRIP ANTIMIC 1/2X4 (GAUZE/BANDAGES/DRESSINGS) IMPLANT
COOLER ICEMAN CLASSIC (MISCELLANEOUS) IMPLANT
COVER SURGICAL LIGHT HANDLE (MISCELLANEOUS) ×1 IMPLANT
CUFF TOURN SGL QUICK 34 (TOURNIQUET CUFF)
CUFF TOURN SGL QUICK 42 (TOURNIQUET CUFF) IMPLANT
CUFF TRNQT CYL 34X4.125X (TOURNIQUET CUFF) IMPLANT
DRAPE C-ARM 42X72 X-RAY (DRAPES) IMPLANT
DRAPE HALF SHEET 40X57 (DRAPES) IMPLANT
DRAPE INCISE IOBAN 66X45 STRL (DRAPES) ×1 IMPLANT
DRAPE ORTHO SPLIT 77X108 STRL (DRAPES)
DRAPE STERI 35X30 U-POUCH (DRAPES) ×1 IMPLANT
DRAPE SURG ORHT 6 SPLT 77X108 (DRAPES) IMPLANT
DRAPE U-SHAPE 47X51 STRL (DRAPES) ×2 IMPLANT
DRAPE X-RAY CASS 24X20 (DRAPES) IMPLANT
DRIVER SHAFT CANN MICRO (MISCELLANEOUS) IMPLANT
DRSG AQUACEL AG ADV 3.5X 6 (GAUZE/BANDAGES/DRESSINGS) IMPLANT
DRSG EMULSION OIL 3X3 NADH (GAUZE/BANDAGES/DRESSINGS) ×1 IMPLANT
DRSG TEGADERM 4X4.75 (GAUZE/BANDAGES/DRESSINGS) ×4 IMPLANT
DRSG XEROFORM 1X8 (GAUZE/BANDAGES/DRESSINGS) IMPLANT
DURAPREP 26ML APPLICATOR (WOUND CARE) ×1 IMPLANT
DW OUTFLOW CASSETTE/TUBE SET (MISCELLANEOUS) IMPLANT
ELECT REM PT RETURN 9FT ADLT (ELECTROSURGICAL) ×1
ELECTRODE REM PT RTRN 9FT ADLT (ELECTROSURGICAL) ×1 IMPLANT
FILTER STRAW FLUID ASPIR (MISCELLANEOUS) ×1 IMPLANT
GAUZE PAD ABD 8X10 STRL (GAUZE/BANDAGES/DRESSINGS) ×1 IMPLANT
GAUZE SPONGE 4X4 12PLY STRL (GAUZE/BANDAGES/DRESSINGS) ×1 IMPLANT
GAUZE XEROFORM 1X8 LF (GAUZE/BANDAGES/DRESSINGS) ×1 IMPLANT
GLOVE BIOGEL PI IND STRL 7.0 (GLOVE) IMPLANT
GLOVE BIOGEL PI IND STRL 8 (GLOVE) ×1 IMPLANT
GLOVE ECLIPSE 7.0 STRL STRAW (GLOVE) ×1 IMPLANT
GLOVE ECLIPSE 8.0 STRL XLNG CF (GLOVE) ×1 IMPLANT
GLOVE INDICATOR 7.5 STRL GRN (GLOVE) ×1 IMPLANT
GLOVE SURG SS PI 6.5 STRL IVOR (GLOVE) IMPLANT
GOWN STRL REUS W/ TWL LRG LVL3 (GOWN DISPOSABLE) ×3 IMPLANT
GOWN STRL REUS W/TWL LRG LVL3 (GOWN DISPOSABLE) ×3
GUIDEWIRE .045XTROC TIP LSR LN (WIRE) IMPLANT
GUIDEWIRE 0.86MM (WIRE) IMPLANT
K-WIRE 1.1 (WIRE) ×1
KIT BASIN OR (CUSTOM PROCEDURE TRAY) ×1 IMPLANT
KIT TURNOVER KIT B (KITS) ×1 IMPLANT
MANIFOLD NEPTUNE II (INSTRUMENTS) ×1 IMPLANT
NDL HYPO 25X1 1.5 SAFETY (NEEDLE) IMPLANT
NDL SCORPION MULTI FIRE (NEEDLE) IMPLANT
NDL SPNL 18GX3.5 QUINCKE PK (NEEDLE) ×1 IMPLANT
NDL SUT 6 .5 CRC .975X.05 MAYO (NEEDLE) ×1 IMPLANT
NEEDLE HYPO 25X1 1.5 SAFETY (NEEDLE) IMPLANT
NEEDLE MAYO TAPER (NEEDLE) ×1
NEEDLE SCORPION MULTI FIRE (NEEDLE) ×1 IMPLANT
NEEDLE SPNL 18GX3.5 QUINCKE PK (NEEDLE) ×1 IMPLANT
NS IRRIG 1000ML POUR BTL (IV SOLUTION) ×1 IMPLANT
PACK GENERAL/GYN (CUSTOM PROCEDURE TRAY) ×1 IMPLANT
PACK SHOULDER (CUSTOM PROCEDURE TRAY) ×1 IMPLANT
PAD ARMBOARD 7.5X6 YLW CONV (MISCELLANEOUS) ×2 IMPLANT
PAD CAST 4YDX4 CTTN HI CHSV (CAST SUPPLIES) ×2 IMPLANT
PAD COLD SHLDR WRAP-ON (PAD) IMPLANT
PADDING CAST COTTON 4X4 STRL (CAST SUPPLIES) ×2
PADDING CAST COTTON 6X4 STRL (CAST SUPPLIES) ×2 IMPLANT
PORT APPOLLO RF 90DEGREE MULTI (SURGICAL WAND) IMPLANT
SLING ARM IMMOBILIZER MED (SOFTGOODS) IMPLANT
SPONGE T-LAP 4X18 ~~LOC~~+RFID (SPONGE) ×2 IMPLANT
STAPLER VISISTAT 35W (STAPLE) ×1 IMPLANT
STOCKINETTE IMPERVIOUS 9X36 MD (GAUZE/BANDAGES/DRESSINGS) IMPLANT
STRIP CLOSURE SKIN 1/2X4 (GAUZE/BANDAGES/DRESSINGS) IMPLANT
SUCTION FRAZIER HANDLE 10FR (MISCELLANEOUS)
SUCTION TUBE FRAZIER 10FR DISP (MISCELLANEOUS) IMPLANT
SUT ETHILON 3 0 PS 1 (SUTURE) ×1 IMPLANT
SUT ETHILON 4 0 FS 1 (SUTURE) IMPLANT
SUT FIBERWIRE 2-0 18 17.9 3/8 (SUTURE)
SUT MNCRL AB 3-0 PS2 18 (SUTURE) IMPLANT
SUT VIC AB 0 CT1 27 (SUTURE) ×2
SUT VIC AB 0 CT1 27XBRD ANBCTR (SUTURE) ×1 IMPLANT
SUT VIC AB 1 CT1 27 (SUTURE)
SUT VIC AB 1 CT1 27XBRD ANBCTR (SUTURE) IMPLANT
SUT VIC AB 1 CT1 36 (SUTURE) IMPLANT
SUT VIC AB 2-0 CT1 27 (SUTURE) ×2
SUT VIC AB 2-0 CT1 TAPERPNT 27 (SUTURE) ×1 IMPLANT
SUT VICRYL 0 UR6 27IN ABS (SUTURE) ×1 IMPLANT
SUTURE FIBERWR 2-0 18 17.9 3/8 (SUTURE) IMPLANT
SYR 20ML LL LF (SYRINGE) ×1 IMPLANT
SYR 3ML LL SCALE MARK (SYRINGE) ×1 IMPLANT
SYR TB 1ML LUER SLIP (SYRINGE) ×1 IMPLANT
TOWEL GREEN STERILE (TOWEL DISPOSABLE) ×1 IMPLANT
TOWEL GREEN STERILE FF (TOWEL DISPOSABLE) ×1 IMPLANT
TUBING ARTHROSCOPY IRRIG 16FT (MISCELLANEOUS) ×1 IMPLANT
WATER STERILE IRR 1000ML POUR (IV SOLUTION) ×1 IMPLANT

## 2022-09-17 NOTE — Anesthesia Procedure Notes (Signed)
Procedure Name: Intubation Date/Time: 09/17/2022 7:46 AM  Performed by: Niel Hummer, CRNAPre-anesthesia Checklist: Patient identified, Emergency Drugs available, Suction available and Patient being monitored Patient Re-evaluated:Patient Re-evaluated prior to induction Oxygen Delivery Method: Circle system utilized Preoxygenation: Pre-oxygenation with 100% oxygen Induction Type: IV induction Ventilation: Mask ventilation without difficulty Laryngoscope Size: Mac and 4 Grade View: Grade I Tube type: Oral Tube size: 7.5 mm Number of attempts: 1 Airway Equipment and Method: Stylet Placement Confirmation: positive ETCO2, ETT inserted through vocal cords under direct vision and breath sounds checked- equal and bilateral Secured at: 23 cm Tube secured with: Tape Dental Injury: Teeth and Oropharynx as per pre-operative assessment
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
## 2022-09-17 NOTE — Anesthesia Postprocedure Evaluation (Signed)
Anesthesia Post Note  Patient: Terrence Riley  Procedure(s) Performed: RIGHT SHOULDER ARTHROSCOPY, SUBSCAPULARIS REPAIR (Right: Shoulder) HARDWARE REMOVAL (Right: Shoulder)     Patient location during evaluation: PACU Anesthesia Type: General Level of consciousness: awake Pain management: pain level controlled Vital Signs Assessment: post-procedure vital signs reviewed and stable Respiratory status: spontaneous breathing, nonlabored ventilation and respiratory function stable Cardiovascular status: blood pressure returned to baseline and stable Postop Assessment: no apparent nausea or vomiting Anesthetic complications: no   No notable events documented.  Last Vitals:  Vitals:   09/17/22 1200 09/17/22 1215  BP: 112/78 109/86  Pulse: 61 (!) 54  Resp: 12 16  Temp:  36.5 C  SpO2: 97% 98%    Last Pain:  Vitals:   09/17/22 1145  TempSrc:   PainSc: Asleep                 Nilda Simmer

## 2022-09-17 NOTE — Brief Op Note (Signed)
   09/17/2022  11:30 AM  PATIENT:  Terrence Riley  22 y.o. male  PRE-OPERATIVE DIAGNOSIS:  right shoulder subscapularis tear, retained hardware  POST-OPERATIVE DIAGNOSIS:  right shoulder subscapularis tear, retained hardware, heterotopic ossification  PROCEDURE:  Procedure(s): RIGHT SHOULDER ARTHROSCOPY, limited debridement , SUBSCAPULARIS REPAIR Heterotopic ossification removal  SURGEON:  Surgeon(s): [name], MD  ASSISTANT: green rnfa  ANESTHESIA:   general  EBL: 25 ml    Total I/O In: 800 [I.V.:600; IV Piggyback:200] Out: 50 [Blood:50]  BLOOD ADMINISTERED: none  DRAINS: none   LOCAL MEDICATIONS USED:  vanco powder SPECIMEN:  No Specimen  COUNTS:  YES  TOURNIQUET:  * No tourniquets in log *  DICTATION: .Other Dictation: Dictation Number [id number]  PLAN OF CARE: Discharge to home after PACU  PATIENT DISPOSITION:  PACU - hemodynamically stable

## 2022-09-17 NOTE — OR Nursing (Signed)
Dr. Ardeen Garland Radiologist called to verify x-ray that was taken related to a broken blade. He stated that there were 2 screws at the humeral head but no other foreign objects noted. Notified Dr. Marlou Sa MD.

## 2022-09-17 NOTE — Transfer of Care (Signed)
Immediate Anesthesia Transfer of Care Note  Patient: Terrence Riley  Procedure(s) Performed: RIGHT SHOULDER ARTHROSCOPY, SUBSCAPULARIS REPAIR (Right: Shoulder) HARDWARE REMOVAL (Right: Shoulder)  Patient Location: PACU  Anesthesia Type:General  Level of Consciousness: awake, alert , and oriented  Airway & Oxygen Therapy: Patient Spontanous Breathing and Patient connected to face mask oxygen  Post-op Assessment: Report given to RN and Post -op Vital signs reviewed and stable  Post vital signs: Reviewed and stable  Last Vitals:  Vitals Value Taken Time  BP 113/90 09/17/22 1121  Temp    Pulse 70   Resp 12   SpO2 94   Vitals shown include unvalidated device data.  Last Pain:  Vitals:   09/17/22 0631  TempSrc:   PainSc: 5       Patients Stated Pain Goal: 2 ([id number])  Complications: No notable events documented.

## 2022-09-17 NOTE — Discharge Instructions (Signed)
No acetaminophen/Tylenol until after 12:39 pm today if needed.   No ibuprofen, Advil, Aleve, Motrin, ketorolac, meloxicam, naproxen, or other NSAIDS until after 5:00pm today if needed.
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
## 2022-09-17 NOTE — Anesthesia Procedure Notes (Addendum)
Anesthesia Regional Block: Interscalene brachial plexus block   Pre-Anesthetic Checklist: , timeout performed,  Correct Patient, Correct Site, Correct Laterality,  Correct Procedure, Correct Position, site marked,  Risks and benefits discussed,  Surgical consent,  Pre-op evaluation,  At surgeon's request and post-op pain management  Laterality: Right  Prep: chloraprep       Needles:  Injection technique: Single-shot  Needle Type: Echogenic Stimulator Needle     Needle Length: 9cm  Needle Gauge: 21     Additional Needles:   Procedures:,,,, ultrasound used (permanent image in chart),,    Narrative:  Start time: 09/17/2022 6:54 AM End time: 09/17/2022 6:56 AM Injection made incrementally with aspirations every 5 mL.  Performed by: Personally  Anesthesiologist: Nilda Simmer, MD  Additional Notes: Discussed risks and benefits of nerve block including, but not limited to, prolonged and/or permanent nerve injury involving sensory and/or motor function. Monitors were applied and a time-out was performed. The nerve and associated structures were visualized under ultrasound guidance. After negative aspiration, local anesthetic was slowly injected around the nerve. There was no evidence of high pressure during the procedure. There were no paresthesias. VSS remained stable and the patient tolerated the procedure well.

## 2022-09-18 ENCOUNTER — Encounter: Payer: Self-pay | Admitting: Orthopedic Surgery

## 2022-09-18 ENCOUNTER — Encounter (HOSPITAL_COMMUNITY): Payer: Self-pay | Admitting: Orthopedic Surgery

## 2022-09-18 DIAGNOSIS — M898X9 Other specified disorders of bone, unspecified site: Secondary | ICD-10-CM

## 2022-09-18 DIAGNOSIS — S46012A Strain of muscle(s) and tendon(s) of the rotator cuff of left shoulder, initial encounter: Secondary | ICD-10-CM

## 2022-09-21 LAB — LEVETIRACETAM LEVEL: Keppra (Levetiracetam): 33.2 ug/mL

## 2022-09-21 LAB — TOPIRAMATE LEVEL: Topiramate Lvl: <0.5 ug/mL — ABNORMAL LOW

## 2022-09-21 NOTE — Telephone Encounter (Signed)
I called.  He is doing okay.  I think Friday appointment should be fine.

## 2022-09-25 ENCOUNTER — Encounter: Payer: Self-pay | Admitting: Surgical

## 2022-09-25 ENCOUNTER — Ambulatory Visit (INDEPENDENT_AMBULATORY_CARE_PROVIDER_SITE_OTHER): Payer: BC Managed Care – PPO | Admitting: Surgical

## 2022-09-25 DIAGNOSIS — S46011D Strain of muscle(s) and tendon(s) of the rotator cuff of right shoulder, subsequent encounter: Secondary | ICD-10-CM

## 2022-09-26 ENCOUNTER — Encounter: Payer: Self-pay | Admitting: Surgical

## 2022-09-26 NOTE — Progress Notes (Signed)
Post-Op Visit Note   Patient: Terrence Riley           Date of Birth: 08-23-2000           MRN: [id number] Visit Date: 09/25/2022 PCP: Eulas Post, MD   Assessment & Plan:  Chief Complaint:  Chief Complaint  Patient presents with   Right Shoulder - Routine Post Op   Visit Diagnoses:  1. Traumatic complete tear of right rotator cuff, subsequent encounter     Plan: [name] ROGER ZARR is a 22 y.o. male who presents s/p revision right shoulder rotator cuff repair of subscapularis tendon on 09/17/2022.  Patient is doing well and pain is overall controlled.  He feels he is doing well.  He was initially having some numbness and tingling in different extremities but states that this has resolved completely.  Taking Celebrex for pain control.  No fevers or chills.  No chest pain or shortness of breath.  Not moving the arm or lifting anything with the arm.  Not lifting the arm under its own power.  To stay in sling.  On exam, patient has range of motion 30 degrees X rotation, 50 degrees abduction, 110 degrees forward elevation passively..  Intact EPL, FPL, finger abduction, finger adduction, pronation/supination, bicep, tricep, deltoid of operative extremity.  Axillary nerve intact with deltoid firing.  Incisions are healing well without evidence of infection or dehiscence.  Sutures removed and replaced with Steri-Strips today.  2+ radial pulse of the operative extremity  Plan is continue with sling immobilization.  No lifting with the operative extremity.  No range of motion of the operative shoulder yet.  Follow-up in 2 weeks for clinical recheck with Dr. Marlou Sa and likely initiation of gentle range of motion exercises..   Follow-Up Instructions: No follow-ups on file.   Orders:  No orders of the defined types were placed in this encounter.  No orders of the defined types were placed in this encounter.   Imaging: No results found.  PMFS History: Patient Active Problem List    Diagnosis Date Noted   Traumatic complete tear of left rotator cuff 09/18/2022   Heterotopic ossification of bone 09/18/2022   Hypokalemia 06/26/2021   B12 deficiency 06/26/2021   Rhabdomyolysis 06/26/2021   Acute encephalopathy 06/20/2021   Aspiration pneumonia ([place]) 06/20/2021   Febrile illness    Shoulder instability, right    Humeral head fracture, right, with malunion, subsequent encounter    Esophagitis determined by endoscopy 07/24/2020   Degenerative tear of glenoid labrum of right shoulder 02/13/2020   Shoulder dislocation, left, subsequent encounter 02/13/2020   Seizure disorder ([place]) 12/22/2019   Migraines 12/22/2019   Mild intermittent asthma 12/22/2019   Migraine without aura and without status migrainosus, not intractable 06/29/2019   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   Altered mental status 03/12/2014   Lethargic 03/09/2014   Environmental allergies 03/01/2014   Gastroesophageal reflux disease with esophagitis 10/06/2013   Localization-related (focal) (partial) symptomatic epilepsy and epileptic syndromes with complex partial seizures, not intractable, without status epilepticus ([place]) 10/06/2013   Spells 07/26/2013   Allergy to other foods 07/13/2013   Prophylactic immunotherapy 07/13/2013   Unspecified asthma, uncomplicated 123456   Eosinophilic esophagitis 123456   Gastroesophageal reflux 01/23/2013   Vomiting 01/23/2013   Leukopenia 10/19/2012   Muscle jerks during sleep 10/19/2012   Constipation 08/20/2012   Neutropenia ([place]) 08/20/2012   Allergic rhinitis due to pollen 02/19/2011   Past Medical History:  Diagnosis Date   Allergy  rhinitis   Asthma    as a child   Complication of anesthesia    pt. reports he need more anesthesia with the septoplasty surgery   Eosinophilic esophagitis    heartburn and reflux   Family history of adverse reaction to anesthesia    mother respiratory failure   Headache    Nasal fracture    deviated septum    Pneumonia    1x history of   Premature baby    2 months early   Seizures ([place])    well controlled on meds/ one 2 months ago when meds were late 11/2021    Family History  Problem Relation Age of Onset   Cancer Mother        breat   Clotting disorder Mother    Protein C deficiency Sister    Stomach cancer Neg Hx    Esophageal cancer Neg Hx    Colon cancer Neg Hx     Past Surgical History:  Procedure Laterality Date   ADENOIDECTOMY     CLOSED REDUCTION NASAL FRACTURE N/A 08/31/2017   Procedure: CLOSED REDUCTION NASAL FRACTURE;  Surgeon: Clyde Canterbury, MD;  Location: [facility];  Service: ENT;  Laterality: N/A;   HARDWARE REMOVAL Right 09/17/2022   Procedure: HARDWARE REMOVAL;  Surgeon: [name], MD;  Location: [employer];  Service: Orthopedics;  Laterality: Right;  PROVIDER REQUESTING 2.0 HOURS OPERATING TIME   ORIF HUMERUS FRACTURE Right 05/05/2021   Procedure: reverse Hill-Sachs allografting, biceps tenodesis;  Surgeon: [name], MD;  Location: [facility];  Service: Orthopedics;  Laterality: Right;   SEPTOPLASTY N/A 08/31/2017   Procedure: SEPTOPLASTY;  Surgeon: Clyde Canterbury, MD;  Location: [facility];  Service: ENT;  Laterality: N/A;   SHOULDER ARTHROSCOPY WITH LABRAL REPAIR Left 04/30/2020   Procedure: LEFT SHOULDER POSTERIOR LABRAL REPAIR WITH ARTHROSCOPY, BICEPS TENDON RELEASE AND TENODESIS, OPEN ALLOGRAFT FOR REVERSE BANKART LESION;  Surgeon: [name], MD;  Location: [facility];  Service: Orthopedics;  Laterality: Left;   SHOULDER ARTHROSCOPY WITH LABRAL REPAIR Right 05/05/2021   Procedure: right shoulder arthroscopy, biceps release, posterior labral repair;;  Surgeon: [name], MD;  Location: [facility];  Service: Orthopedics;  Laterality: Right;   SHOULDER ARTHROSCOPY WITH SUBACROMIAL DECOMPRESSION AND BICEP TENDON REPAIR Right 09/17/2022   Procedure: RIGHT SHOULDER ARTHROSCOPY, SUBSCAPULARIS REPAIR;  Surgeon: [name], MD;   Location: [facility];  Service: Orthopedics;  Laterality: Right;   TONSILLECTOMY     and addenoids   UPPER GASTROINTESTINAL ENDOSCOPY  05/15/2022   Social History   Occupational History   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

## 2022-09-30 ENCOUNTER — Encounter: Payer: Self-pay | Admitting: Orthopedic Surgery

## 2022-10-01 NOTE — Telephone Encounter (Signed)
Driving his decision - most people not driving yet

## 2022-10-07 ENCOUNTER — Telehealth: Payer: Self-pay | Admitting: Neurology

## 2022-10-07 ENCOUNTER — Other Ambulatory Visit (HOSPITAL_COMMUNITY): Payer: Self-pay

## 2022-10-07 ENCOUNTER — Other Ambulatory Visit: Payer: Self-pay

## 2022-10-07 ENCOUNTER — Other Ambulatory Visit: Payer: Self-pay | Admitting: Family Medicine

## 2022-10-07 MED ORDER — MIRTAZAPINE 15 MG PO TBDP: 15.0000 mg | ORAL_TABLET | Freq: Every day | ORAL | 0 refills | Status: DC

## 2022-10-07 NOTE — Telephone Encounter (Signed)
Pt called in and left a message with the access nurse. His medication needs a prior auth and he is completely out of his medication.

## 2022-10-07 NOTE — Telephone Encounter (Signed)
PA not needed. called [facility] and they are processing. At time that medication was requested by patient it was refill too soon. pharmacy is filling it now. Pt called an advised

## 2022-10-09 ENCOUNTER — Ambulatory Visit (INDEPENDENT_AMBULATORY_CARE_PROVIDER_SITE_OTHER): Payer: BC Managed Care – PPO | Admitting: Surgical

## 2022-10-09 ENCOUNTER — Encounter: Payer: Self-pay | Admitting: Surgical

## 2022-10-09 DIAGNOSIS — S46011D Strain of muscle(s) and tendon(s) of the rotator cuff of right shoulder, subsequent encounter: Secondary | ICD-10-CM

## 2022-10-09 NOTE — Progress Notes (Signed)
Post-Op Visit Note   Patient: Terrence Riley           Date of Birth: [birth year]           MRN: [id number] Visit Date: 10/09/2022 PCP: Eulas Post, MD   Assessment & Plan:  Chief Complaint:  Chief Complaint  Patient presents with   Right Shoulder - Routine Post Op   Visit Diagnoses:  No diagnosis found.   Plan: BREAKER CAPITO is a 22 y.o. male who presents s/p revision right shoulder rotator cuff repair of subscapularis tendon on 09/17/2022.  Patient is doing well and pain is overall controlled.  He feels like his shoulder feels very good.  He sometimes forgets that he had surgery on it.  He is not doing any lifting or athletic activities.  Does have occasional soreness in the superior aspect of the trapezius.  Taking occasional Celebrex for pain control.  On exam, patient has range of motion 40 degrees X rotation, 80 degrees abduction, 160 degrees forward elevation passively..  Intact EPL, FPL, finger abduction, finger adduction, pronation/supination, bicep, tricep, deltoid of operative extremity.  Axillary nerve intact with deltoid firing.  Incision healing well without evidence of infection or dehiscence.  2+ radial pulse of the operative extremity.  Excellent supra and infraspinatus strength.  He does have some firing of the subscapularis but it is a little weaker rated 4/5.  Plan is continue with sling immobilization.  He is okay to discontinue sling next week at 4 weeks out from surgery.  We will start him upstairs in outpatient physical therapy.  Really just needs to work on passive range of motion and active assisted range of motion.  No strengthening until 7 weeks out from surgery.  He is to avoid external rotation past 45 degrees.  Follow-up with Dr. Marlou Sa for clinical recheck in 4 weeks.  Follow-Up Instructions: No follow-ups on file.   Orders:  No orders of the defined types were placed in this encounter.  No orders of the defined types were placed in this  encounter.   Imaging: No results found.  PMFS History: Patient Active Problem List   Diagnosis Date Noted   Traumatic complete tear of left rotator cuff 09/18/2022   Heterotopic ossification of bone 09/18/2022   Hypokalemia 06/26/2021   B12 deficiency 06/26/2021   Rhabdomyolysis 06/26/2021   Acute encephalopathy 06/20/2021   Aspiration pneumonia ([place]) 06/20/2021   Febrile illness    Shoulder instability, right    Humeral head fracture, right, with malunion, subsequent encounter    Esophagitis determined by endoscopy 07/24/2020   Degenerative tear of glenoid labrum of right shoulder 02/13/2020   Shoulder dislocation, left, subsequent encounter 02/13/2020   Seizure disorder ([place]) 12/22/2019   Migraines 12/22/2019   Mild intermittent asthma 12/22/2019   Migraine without aura and without status migrainosus, not intractable 06/29/2019   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   Altered mental status 03/12/2014   Lethargic 03/09/2014   Environmental allergies 03/01/2014   Gastroesophageal reflux disease with esophagitis 10/06/2013   Localization-related (focal) (partial) symptomatic epilepsy and epileptic syndromes with complex partial seizures, not intractable, without status epilepticus ([place]) 10/06/2013   Spells 07/26/2013   Allergy to other foods 07/13/2013   Prophylactic immunotherapy 07/13/2013   Unspecified asthma, uncomplicated 123456   Eosinophilic esophagitis 123456   Gastroesophageal reflux 01/23/2013   Vomiting 01/23/2013   Leukopenia 10/19/2012   Muscle jerks during sleep 10/19/2012   Constipation 08/20/2012   Neutropenia ([place]) 08/20/2012   Allergic rhinitis due  to pollen 02/19/2011   Past Medical History:  Diagnosis Date   Allergy    rhinitis   Asthma    as a child   Complication of anesthesia    pt. reports he need more anesthesia with the septoplasty surgery   Eosinophilic esophagitis    heartburn and reflux   Family history of adverse reaction to  anesthesia    mother respiratory failure   Headache    Nasal fracture    deviated septum   Pneumonia    1x history of   Premature baby    2 months early   Seizures ([place])    well controlled on meds/ one 2 months ago when meds were late 11/2021    Family History  Problem Relation Age of Onset   Cancer Mother        breat   Clotting disorder Mother    Protein C deficiency Sister    Stomach cancer Neg Hx    Esophageal cancer Neg Hx    Colon cancer Neg Hx     Past Surgical History:  Procedure Laterality Date   ADENOIDECTOMY     CLOSED REDUCTION NASAL FRACTURE N/A 08/31/2017   Procedure: CLOSED REDUCTION NASAL FRACTURE;  Surgeon: Clyde Canterbury, MD;  Location: [facility];  Service: ENT;  Laterality: N/A;   HARDWARE REMOVAL Right 09/17/2022   Procedure: HARDWARE REMOVAL;  Surgeon: [name], MD;  Location: [facility];  Service: Orthopedics;  Laterality: Right;  PROVIDER REQUESTING 2.0 HOURS OPERATING TIME   ORIF HUMERUS FRACTURE Right 05/05/2021   Procedure: reverse Hill-Sachs allografting, biceps tenodesis;  Surgeon: [name], MD;  Location: [facility];  Service: Orthopedics;  Laterality: Right;   SEPTOPLASTY N/A 08/31/2017   Procedure: SEPTOPLASTY;  Surgeon: Clyde Canterbury, MD;  Location: [facility];  Service: ENT;  Laterality: N/A;   SHOULDER ARTHROSCOPY WITH LABRAL REPAIR Left 04/30/2020   Procedure: LEFT SHOULDER POSTERIOR LABRAL REPAIR WITH ARTHROSCOPY, BICEPS TENDON RELEASE AND TENODESIS, OPEN ALLOGRAFT FOR REVERSE BANKART LESION;  Surgeon: [name], MD;  Location: [facility];  Service: Orthopedics;  Laterality: Left;   SHOULDER ARTHROSCOPY WITH LABRAL REPAIR Right 05/05/2021   Procedure: right shoulder arthroscopy, biceps release, posterior labral repair;;  Surgeon: [name], MD;  Location: [facility];  Service: Orthopedics;  Laterality: Right;   SHOULDER ARTHROSCOPY WITH SUBACROMIAL DECOMPRESSION AND BICEP TENDON REPAIR Right 09/17/2022    Procedure: RIGHT SHOULDER ARTHROSCOPY, SUBSCAPULARIS REPAIR;  Surgeon: [name], MD;  Location: [facility];  Service: Orthopedics;  Laterality: Right;   TONSILLECTOMY     and addenoids   UPPER GASTROINTESTINAL ENDOSCOPY  05/15/2022   Social History   Occupational History   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

## 2022-10-13 ENCOUNTER — Ambulatory Visit: Payer: BC Managed Care – PPO | Admitting: Neurology

## 2022-10-22 ENCOUNTER — Encounter (HOSPITAL_COMMUNITY): Payer: Self-pay | Admitting: Emergency Medicine

## 2022-10-22 ENCOUNTER — Emergency Department (HOSPITAL_COMMUNITY)
Admission: EM | Admit: 2022-10-22 | Discharge: 2022-10-22 | Discharge status: Against Medical Advice | Payer: BC Managed Care – PPO | Attending: Emergency Medicine | Admitting: Emergency Medicine

## 2022-10-22 ENCOUNTER — Emergency Department (HOSPITAL_COMMUNITY): Payer: BC Managed Care – PPO

## 2022-10-22 DIAGNOSIS — Z5329 Procedure and treatment not carried out because of patient's decision for other reasons: Secondary | ICD-10-CM | POA: Insufficient documentation

## 2022-10-22 DIAGNOSIS — R0789 Other chest pain: Secondary | ICD-10-CM | POA: Diagnosis not present

## 2022-10-22 DIAGNOSIS — R079 Chest pain, unspecified: Secondary | ICD-10-CM

## 2022-10-22 LAB — COMPREHENSIVE METABOLIC PANEL
ALT: 18 U/L (ref 0–44)
AST: 24 U/L (ref 15–41)
Albumin: 4.2 g/dL (ref 3.5–5.0)
Alkaline Phosphatase: 69 U/L (ref 38–126)
Anion gap: 11 (ref 5–15)
BUN: 6 mg/dL (ref 6–20)
CO2: 25 mmol/L (ref 22–32)
Calcium: 9.5 mg/dL (ref 8.9–10.3)
Chloride: 105 mmol/L (ref 98–111)
Creatinine, Ser: 1.03 mg/dL (ref 0.61–1.24)
GFR, Estimated: >60 mL/min (ref >60)
Glucose, Bld: 94 mg/dL (ref 70–99)
Potassium: 3.6 mmol/L (ref 3.5–5.1)
Sodium: 141 mmol/L (ref 135–145)
Total Bilirubin: 0.5 mg/dL (ref 0.3–1.2)
Total Protein: 6.6 g/dL (ref 6.5–8.1)

## 2022-10-22 LAB — CBC
HCT: 42.3 % (ref 39.0–52.0)
Hemoglobin: 14.5 g/dL (ref 13.0–17.0)
MCH: 29.2 pg (ref 26.0–34.0)
MCHC: 34.3 g/dL (ref 30.0–36.0)
MCV: 85.1 fL (ref 80.0–100.0)
Platelets: 146 10*3/uL — ABNORMAL LOW (ref 150–400)
RBC: 4.97 MIL/uL (ref 4.22–5.81)
RDW: 12 % (ref 11.5–15.5)
WBC: 3.9 10*3/uL — ABNORMAL LOW (ref 4.0–10.5)
nRBC: 0 % (ref 0.0–0.2)

## 2022-10-22 LAB — CBG MONITORING, ED: Glucose-Capillary: 92 mg/dL (ref 70–99)

## 2022-10-22 LAB — TROPONIN I (HIGH SENSITIVITY): Troponin I (High Sensitivity): <2 ng/L (ref <18)

## 2022-10-22 NOTE — ED Triage Notes (Addendum)
Pt found by 2H slumped over in hallway. Will not open his eyes and states he has chest pain. Is responsive but will only grunt and whisper. Denies ETOH or drugs or that anyone hurt him. Was able to get onto stretcher with some prompting. Denies missing his seizure meds. Guarding his chest due to pain.

## 2022-10-22 NOTE — ED Notes (Signed)
Pt A&Ox4. Pt requesting to leave ED. PA [place] at bedside to discuss risks of leaving with pt. Pt refuses to stay. AMA form signed. Pt ambulated from ED with steady gait.

## 2022-10-22 NOTE — ED Provider Notes (Signed)
[facility] Provider Note   CSN: [id number] Arrival date & time: 10/22/22  0032     History  Chief Complaint  Patient presents with   Chest Pain    Terrence Riley is a 22 y.o. male.  Patient presents to the emergency department due to chest pain.  Patient was someone on the heart patient was found to be slumped over in the hallway.  Patient states that overnight he began to have sharp/tight left-sided chest pain.  He does endorse tenderness to the area.  He denies shortness of breath, nausea, vomiting.  Patient hesitant to provide answers, tremulous.  Patient with past medical history stable for seizure disorder, GERD, focal partial symptomatic epilepsy, migraines, encephalopathy, auditory hallucinations, accidental overdose of home medications, atypical chest pain  HPI     Home Medications Prior to Admission medications   Medication Sig Start Date End Date Taking? Authorizing Provider  celecoxib (CELEBREX) 100 MG capsule Take 1 capsule (100 mg total) by mouth 2 (two) times daily. 09/17/22   [name], MD  methocarbamol (ROBAXIN) 500 MG tablet Take 1 tablet (500 mg total) by mouth every 8 (eight) hours as needed for muscle spasms. 09/17/22   [name], MD  oxycodone (OXY-IR) 5 MG capsule Take 1 capsule (5 mg total) by mouth every 4 (four) hours as needed. 09/17/22   [name], MD  celecoxib (CELEBREX) 100 MG capsule Take 1 capsule (100 mg total) by mouth 2 (two) times daily. 09/17/22 09/17/23  [name], MD  clonazePAM Bobbye [place]) 0.5 MG tablet Take 1 tablet every night 09/16/22   Cameron Sprang, MD  cyanocobalamin (,VITAMIN B-12,) 1000 MCG/ML injection Inject 1 mL (1,000 mcg total) into the muscle every 30 (thirty) days. 06/27/21   [name], MD  EPINEPHrine 0.3 mg/0.3 mL IJ SOAJ injection Inject 0.3 mg into the muscle as needed for anaphylaxis. 07/10/20   McDonald, Mia A, PA-C  KEPPRA 1000 MG  tablet Take 1 tablet in AM, 2 tablets in PM 09/16/22   Cameron Sprang, MD  methocarbamol (ROBAXIN) 500 MG tablet Take 1 tablet (500 mg total) by mouth every 8 (eight) hours as needed for muscle spasms. 09/17/22   [name], MD  mirtazapine (REMERON SOL-TAB) 15 MG disintegrating tablet Take 1 tablet (15 mg total) by mouth at bedtime. 10/07/22   Burchette, Alinda Sierras, MD  oxyCODONE (OXY IR/ROXICODONE) 5 MG immediate release tablet Take 1 tablet (5 mg total) by mouth every 4 (four) hours as needed for severe pain. 09/17/22   [name], MD      Allergies    Lortab [hydrocodone-acetaminophen], Other, Zofran [name] hcl], Citrus, Dairy aid [tilactase], Influenza virus vaccine, and Soy allergy    Review of Systems   Review of Systems  Cardiovascular:  Positive for chest pain.    Physical Exam Updated Vital Signs BP (!) 147/91 (BP Location: Right Arm)   Pulse 87   Temp 98.3 F (36.8 C) (Oral)   Resp 18   SpO2 100%  Physical Exam Vitals and nursing note reviewed.  Constitutional:      General: He is not in acute distress.    Appearance: He is well-developed.  HENT:     Head: Normocephalic and atraumatic.  Eyes:     Conjunctiva/sclera: Conjunctivae normal.  Cardiovascular:     Rate and Rhythm: Normal rate and regular rhythm.     Heart sounds: No murmur heard. Pulmonary:  Effort: Pulmonary effort is normal. No respiratory distress.     Breath sounds: Normal breath sounds.  Chest:     Chest wall: Tenderness present.  Abdominal:     Palpations: Abdomen is soft.     Tenderness: There is no abdominal tenderness.  Musculoskeletal:        General: No swelling.     Cervical back: Neck supple.  Skin:    General: Skin is warm and dry.     Capillary Refill: Capillary refill takes less than 2 seconds.  Neurological:     Mental Status: He is alert.  Psychiatric:        Mood and Affect: Mood normal.     ED Results / Procedures / Treatments   Labs (all labs  ordered are listed, but only abnormal results are displayed) Labs Reviewed  COMPREHENSIVE METABOLIC PANEL  CBC  LEVETIRACETAM LEVEL  RAPID URINE DRUG SCREEN, HOSP PERFORMED  CBG MONITORING, ED  TROPONIN I (HIGH SENSITIVITY)    EKG EKG Interpretation  Date/Time:  Thursday October 22 2022 00:43:07 EDT Ventricular Rate:  85 PR Interval:  145 QRS Duration: 88 QT Interval:  362 QTC Calculation: 431 R Axis:   62 Text Interpretation: Sinus rhythm ST elev, probable normal early repol pattern Baseline wander in lead(s) V1 V2 Confirmed by Merrily Pew [phone]) on 10/22/2022 12:46:45 AM  Radiology No results found.  Procedures Procedures    Medications Ordered in ED Medications - No data to display  ED Course/ Medical Decision Making/ A&P                             Medical Decision Making Amount and/or Complexity of Data Reviewed Labs: ordered. Radiology: ordered.   Patient presented with a chief complaint of chest pain.  Differential diagnoses include ACS, anxiety, pulmonary embolism, pneumonia, and others  I reviewed the patient's past medical history including notes from emergency department visits over the past year for accidental drug overdoses and seizures.  Patient has comorbidities including history of seizures  I ordered and reviewed labs.  Initial labs were unremarkable  I ordered and interpreted imaging including plain films of the chest.  No active disease or pneumonia was noted.  I agree with the radiologist findings  I went to reassess the patient and he stated he needed to leave.  His mother is currently in the heart unit upstairs in this hospital.  I explained that his evaluation was not complete at this time but the patient stated he needed to go at this time and understands that he would be leaving [place] and understands the risks involved.  Based on his concern for his mother I do feel this is likely anxiety related but before workup was  completed patient left the hospital.        Final Clinical Impression(s) / ED Diagnoses Final diagnoses:  None    Rx / DC Orders ED Discharge Orders     None         Ronny Bacon 10/22/22 0301    Mesner, Corene Cornea, MD 10/22/22 [phone]

## 2022-10-26 ENCOUNTER — Telehealth: Payer: Self-pay | Admitting: *Deleted

## 2022-10-26 NOTE — Telephone Encounter (Signed)
Transition Care Management Follow-up Telephone Call  Date discharged? 10/22/22  How have you been since you were released from the hospital? fine  Do you understand why you were in the hospital? yes  Do you understand the discharge instructions? yes  Where were you discharged to? home   Items Reviewed: Medications reviewed: yes Allergies reviewed: yes Dietary changes reviewed: yes Referrals reviewed: yes   Functional Questionnaire:  Activities of Daily Living (ADLs):   He states they are independent in the following:  all above States they require assistance with the following:  none  Any transportation issues/concerns?: no  Any patient concerns? no  Confirmed importance and date/time of follow-up visits scheduled yes Provider Appointment booked with Dorothyann Peng, NP 10/28/22 at 11 am  Confirmed with patient if condition begins to worsen call PCP or go to the ER.  Patient was given the office number and encouraged to call back with question or concerns.  : yes

## 2022-10-28 ENCOUNTER — Ambulatory Visit: Payer: BC Managed Care – PPO | Admitting: Adult Health

## 2022-10-28 NOTE — Progress Notes (Deleted)
Subjective:    Patient ID: Terrence Riley, male    DOB: [birth year], 22 y.o.   MRN: [id number]  HPI 22 year old male who  has a past medical history of Allergy, Asthma, Complication of anesthesia, Eosinophilic esophagitis, Family history of adverse reaction to anesthesia, Headache, Nasal fracture, Pneumonia, Premature baby, and Seizures ([place]).  He is a patient of Dr. Elease Hashimoto who I am seeing today for the first time for follow up after being seen in the ER   He presented to the emergency department 6 days ago due to chest pain.  He presented from the heart unit, was found slumped over in the hallway.  He reported that overnight he began to have sharp/tight left-sided chest pain.  He did endorse tenderness to the area.  Denied shortness of breath, nausea, vomiting.  Labs including troponin, CBC, CMP were within normal limits.  EKG showed sinus rhythm with ST elevation, probable early repolarization pattern and baseline wander in leads V1 and V2 with a heart rate of 85.  Chest x-ray did not show any active cardiopulmonary disease.  He did leave [place], his mother was on the heart unit upstairs in the hospital.   Review of Systems See HPI   Past Medical History:  Diagnosis Date   Allergy    rhinitis   Asthma    as a child   Complication of anesthesia    pt. reports he need more anesthesia with the septoplasty surgery   Eosinophilic esophagitis    heartburn and reflux   Family history of adverse reaction to anesthesia    mother respiratory failure   Headache    Nasal fracture    deviated septum   Pneumonia    1x history of   Premature baby    2 months early   Seizures ([place])    well controlled on meds/ one 2 months ago when meds were late 11/2021    Social History   Socioeconomic History   Marital status: Single    Spouse name: Not on file   Number of children: 0   Years of education: Not on file   Highest education level: Not on file  Occupational  History   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  Other Topics Concern   Not on file  Social History Narrative   Right handed   Lives at home with mom goes to college   Social Determinants of Health   Financial Resource Strain: Not on file  Food Insecurity: Not on file  Transportation Needs: Not on file  Physical Activity: Not on file  Stress: Not on file  Social Connections: Not on file  Intimate Partner Violence: Not on file    Past Surgical History:  Procedure Laterality Date   ADENOIDECTOMY     CLOSED REDUCTION NASAL FRACTURE N/A 08/31/2017   Procedure: CLOSED REDUCTION NASAL FRACTURE;  Surgeon: Clyde Canterbury, MD;  Location: [place];  Service: ENT;  Laterality: N/A;   HARDWARE REMOVAL Right 09/17/2022   Procedure: HARDWARE REMOVAL;  Surgeon: [name], MD;  Location: [facility];  Service: Orthopedics;  Laterality: Right;  PROVIDER REQUESTING 2.0 HOURS OPERATING TIME   ORIF HUMERUS FRACTURE Right 05/05/2021   Procedure: reverse Hill-Sachs allografting, biceps tenodesis;  Surgeon: [name], MD;  Location: [facility];  Service: Orthopedics;  Laterality: Right;   SEPTOPLASTY N/A 08/31/2017   Procedure: SEPTOPLASTY;  Surgeon: Clyde Canterbury, MD;  Location: [place];  Service: ENT;  Laterality: N/A;   SHOULDER ARTHROSCOPY WITH LABRAL REPAIR Left 04/30/2020   Procedure: LEFT SHOULDER POSTERIOR LABRAL REPAIR WITH ARTHROSCOPY, BICEPS TENDON RELEASE AND TENODESIS, OPEN ALLOGRAFT FOR REVERSE BANKART LESION;  Surgeon: [name], MD;  Location: [facility];  Service: Orthopedics;  Laterality: Left;   SHOULDER ARTHROSCOPY WITH LABRAL REPAIR Right 05/05/2021   Procedure: right shoulder arthroscopy, biceps release, posterior labral repair;;  Surgeon: [name], MD;  Location: [place];  Service: Orthopedics;  Laterality: Right;   SHOULDER ARTHROSCOPY WITH SUBACROMIAL DECOMPRESSION AND BICEP TENDON REPAIR Right 09/17/2022   Procedure: RIGHT SHOULDER ARTHROSCOPY, SUBSCAPULARIS REPAIR;  Surgeon: [name], MD;  Location: [facility];  Service: Orthopedics;  Laterality: Right;   TONSILLECTOMY     and addenoids   UPPER GASTROINTESTINAL ENDOSCOPY  05/15/2022    Family History  Problem Relation Age of Onset   Cancer Mother        breat   Clotting disorder Mother    Protein C deficiency Sister    Stomach cancer Neg Hx    Esophageal cancer Neg Hx    Colon cancer Neg Hx     Allergies  Allergen Reactions   Lortab [Hydrocodone-Acetaminophen] Hives   Other Anaphylaxis    Peanuts   Zofran [Ondansetron Hcl] Nausea And Vomiting   Citrus Other (See Comments)    Sores in mouth   Dairy Aid [Tilactase] Nausea And Vomiting   Influenza Virus Vaccine Other (See Comments)    Partial paralysis for a couple of weeks per mom   Soy Allergy Other (See Comments)    Seizures     Current Outpatient Medications on File Prior to Visit  Medication Sig Dispense Refill   oxycodone (OXY-IR) 5 MG capsule Take 1 capsule (5 mg total) by mouth every 4 (four) hours as needed. 30 capsule 0   celecoxib (CELEBREX) 100 MG capsule Take 1 capsule (100 mg total) by mouth 2 (two) times daily. 60 capsule 2   clonazePAM (KLONOPIN) 0.5 MG tablet Take 1 tablet every night 30 tablet 5   cyanocobalamin (,VITAMIN B-12,) 1000 MCG/ML injection Inject 1 mL (1,000 mcg total) into the muscle every 30 (thirty) days. 1 mL 0   EPINEPHrine 0.3 mg/0.3 mL IJ SOAJ injection Inject 0.3 mg into the muscle as needed for anaphylaxis. 1 each 0   KEPPRA 1000 MG tablet Take 1 tablet in AM, 2 tablets in PM 270 tablet 3   methocarbamol (ROBAXIN) 500 MG tablet Take 1 tablet (500 mg total) by mouth every 8 (eight) hours as needed for muscle spasms. 30 tablet 0   mirtazapine (REMERON SOL-TAB) 15 MG  disintegrating tablet Take 1 tablet (15 mg total) by mouth at bedtime. 30 tablet 0   oxyCODONE (OXY IR/ROXICODONE) 5 MG immediate release tablet Take 1 tablet (5 mg total) by mouth every 4 (four) hours as needed for severe pain. 60 tablet 0   No current facility-administered medications on file prior to visit.    There were no vitals taken for this visit.      Objective:   Physical Exam Vitals and nursing note reviewed.  Constitutional:      Appearance: Normal appearance.  Cardiovascular:     Rate and Rhythm: Normal rate and regular rhythm.     Pulses:  Normal pulses.     Heart sounds: Normal heart sounds.  Pulmonary:     Effort: Pulmonary effort is normal.     Breath sounds: Normal breath sounds.  Skin:    General: Skin is warm and dry.  Neurological:     General: No focal deficit present.     Mental Status: He is alert and oriented to person, place, and time.  Psychiatric:        Mood and Affect: Mood normal.        Behavior: Behavior normal.        Thought Content: Thought content normal.        Judgment: Judgment normal.       Assessment & Plan:

## 2022-11-05 ENCOUNTER — Ambulatory Visit: Payer: BC Managed Care – PPO | Admitting: Surgical

## 2022-11-06 ENCOUNTER — Ambulatory Visit: Payer: BC Managed Care – PPO | Admitting: Surgical

## 2022-11-11 ENCOUNTER — Ambulatory Visit: Payer: BC Managed Care – PPO | Admitting: Surgical

## 2022-11-16 ENCOUNTER — Encounter: Payer: Self-pay | Admitting: Neurology

## 2022-12-09 ENCOUNTER — Telehealth: Payer: Self-pay | Admitting: Neurology

## 2022-12-09 NOTE — Telephone Encounter (Signed)
Patient wants to know if his forms are ready to be picked up for the tint on his windows  please call

## 2022-12-09 NOTE — Telephone Encounter (Signed)
Pt called per DPR left a voice mail that paperwork is ready and at the front desk there is a $25 fee

## 2022-12-10 DIAGNOSIS — Z0279 Encounter for issue of other medical certificate: Secondary | ICD-10-CM

## 2022-12-11 ENCOUNTER — Emergency Department (HOSPITAL_COMMUNITY)
Admission: EM | Admit: 2022-12-11 | Discharge: 2022-12-11 | Disposition: A | Discharge status: Home / Self Care | Payer: BC Managed Care – PPO | Attending: Emergency Medicine | Admitting: Emergency Medicine

## 2022-12-11 ENCOUNTER — Other Ambulatory Visit: Payer: Self-pay

## 2022-12-11 DIAGNOSIS — R519 Headache, unspecified: Secondary | ICD-10-CM | POA: Insufficient documentation

## 2022-12-11 DIAGNOSIS — R42 Dizziness and giddiness: Secondary | ICD-10-CM | POA: Diagnosis not present

## 2022-12-11 LAB — CBC WITH DIFFERENTIAL/PLATELET
Abs Immature Granulocytes: 0 10*3/uL (ref 0.00–0.07)
Basophils Absolute: 0.1 10*3/uL (ref 0.0–0.1)
Basophils Relative: 2 %
Eosinophils Absolute: 0.2 10*3/uL (ref 0.0–0.5)
Eosinophils Relative: 4 %
HCT: 44.6 % (ref 39.0–52.0)
Hemoglobin: 15 g/dL (ref 13.0–17.0)
Immature Granulocytes: 0 %
Lymphocytes Relative: 31 %
Lymphs Abs: 1.2 10*3/uL (ref 0.7–4.0)
MCH: 29 pg (ref 26.0–34.0)
MCHC: 33.6 g/dL (ref 30.0–36.0)
MCV: 86.3 fL (ref 80.0–100.0)
Monocytes Absolute: 0.3 10*3/uL (ref 0.1–1.0)
Monocytes Relative: 8 %
Neutro Abs: 2.2 10*3/uL (ref 1.7–7.7)
Neutrophils Relative %: 55 %
Platelets: 168 10*3/uL (ref 150–400)
RBC: 5.17 MIL/uL (ref 4.22–5.81)
RDW: 12 % (ref 11.5–15.5)
WBC: 4 10*3/uL (ref 4.0–10.5)
nRBC: 0 % (ref 0.0–0.2)

## 2022-12-11 LAB — BASIC METABOLIC PANEL
Anion gap: 11 (ref 5–15)
BUN: 9 mg/dL (ref 6–20)
CO2: 25 mmol/L (ref 22–32)
Calcium: 9.7 mg/dL (ref 8.9–10.3)
Chloride: 101 mmol/L (ref 98–111)
Creatinine, Ser: 1.08 mg/dL (ref 0.61–1.24)
GFR, Estimated: >60 mL/min (ref >60)
Glucose, Bld: 85 mg/dL (ref 70–99)
Potassium: 3.9 mmol/L (ref 3.5–5.1)
Sodium: 137 mmol/L (ref 135–145)

## 2022-12-11 MED ORDER — IBUPROFEN 400 MG PO TABS: 400.0000 mg | ORAL_TABLET | Freq: Once | ORAL | Status: DC

## 2022-12-11 NOTE — ED Provider Notes (Signed)
[place] EMERGENCY DEPARTMENT AT [facility] Provider Note   CSN: [id number] Arrival date & time: 12/11/22  4098     History  Chief Complaint  Patient presents with   Dizziness    Terrence Riley is a 22 y.o. male who presents to the ED after a seizure and possible fall 3 days ago. Patient only remembers waking up on the couch that day. Mother on the phone reports that patient was playing basketball at the gym earlier that day when he collapsed from a seizure. Patient does not remember playing basketball. Patient endorsing that it had been approx. 1 year since his last seizure. Endorses compliance on seizure medications. Complaining of lightheadedness, mild headache, and mild nausea since seizure. Initially had bump on head that has since resolved.  Patient denies right sided numbness or any other paresthesias. Denies ETOH/Tob/IDU. Denies changes in vision, fevers, chills, chest pain, SOB, abdominal pain, dysuria, urinary/bowel incontinence. Denies Blood thinners. No recent vomiting.   Dizziness Associated symptoms: headaches and nausea        Home Medications Prior to Admission medications   Medication Sig Start Date End Date Taking? Authorizing Provider  oxycodone (OXY-IR) 5 MG capsule Take 1 capsule (5 mg total) by mouth every 4 (four) hours as needed. 09/17/22   Cammy Copa, MD  celecoxib (CELEBREX) 100 MG capsule Take 1 capsule (100 mg total) by mouth 2 (two) times daily. 09/17/22 09/17/23  Cammy Copa, MD  clonazePAM Scarlette Calico) 0.5 MG tablet Take 1 tablet every night 09/16/22   Van Clines, MD  cyanocobalamin (,VITAMIN B-12,) 1000 MCG/ML injection Inject 1 mL (1,000 mcg total) into the muscle every 30 (thirty) days. 06/27/21   Leatha Gilding, MD  EPINEPHrine 0.3 mg/0.3 mL IJ SOAJ injection Inject 0.3 mg into the muscle as needed for anaphylaxis. 07/10/20   McDonald, Mia A, PA-C  KEPPRA 1000 MG tablet Take 1 tablet in AM, 2 tablets in PM 09/16/22    Van Clines, MD  methocarbamol (ROBAXIN) 500 MG tablet Take 1 tablet (500 mg total) by mouth every 8 (eight) hours as needed for muscle spasms. 09/17/22   Cammy Copa, MD  mirtazapine (REMERON SOL-TAB) 15 MG disintegrating tablet Take 1 tablet (15 mg total) by mouth at bedtime. 10/07/22   Burchette, Elberta Fortis, MD  oxyCODONE (OXY IR/ROXICODONE) 5 MG immediate release tablet Take 1 tablet (5 mg total) by mouth every 4 (four) hours as needed for severe pain. 09/17/22   Cammy Copa, MD      Allergies    Lortab [hydrocodone-acetaminophen], Other, Zofran Frazier Richards hcl], Citrus, Dairy aid [tilactase], Influenza virus vaccine, and Soy allergy    Review of Systems   Review of Systems  Gastrointestinal:  Positive for nausea.  Neurological:  Positive for light-headedness and headaches.    Physical Exam Updated Vital Signs BP (!) 142/91   Pulse 71   Temp 99.5 F (37.5 C) (Oral)   Resp 20   SpO2 99%  Physical Exam Vitals and nursing note reviewed.  Constitutional:      General: He is not in acute distress.    Appearance: He is not ill-appearing, toxic-appearing or diaphoretic.  HENT:     Head: Normocephalic and atraumatic.     Right Ear: Tympanic membrane, ear canal and external ear normal.     Left Ear: Tympanic membrane, ear canal and external ear normal.     Ears:     Comments: No blood in ear canal or  behind TM    Mouth/Throat:     Mouth: Mucous membranes are moist.     Pharynx: No oropharyngeal exudate or posterior oropharyngeal erythema.     Comments: Tongue without laceration Eyes:     General: No scleral icterus.       Right eye: No discharge.        Left eye: No discharge.     Extraocular Movements: Extraocular movements intact.     Conjunctiva/sclera: Conjunctivae normal.     Pupils: Pupils are equal, round, and reactive to light.     Comments: Vision grossly intact. Peripheral vision intact.  Cardiovascular:     Rate and Rhythm: Normal rate and regular  rhythm.     Pulses: Normal pulses.     Heart sounds: Normal heart sounds. No murmur heard. Pulmonary:     Effort: Pulmonary effort is normal. No respiratory distress.     Breath sounds: Normal breath sounds. No wheezing, rhonchi or rales.  Abdominal:     General: Abdomen is flat. Bowel sounds are normal.     Palpations: Abdomen is soft. There is no mass.     Tenderness: There is no abdominal tenderness.  Musculoskeletal:        General: No swelling or tenderness. Normal range of motion.     Cervical back: Normal range of motion and neck supple. No rigidity or tenderness.     Right lower leg: No edema.     Left lower leg: No edema.     Comments: No tenderness to spine, head, abdomen, or extremities.  Skin:    General: Skin is warm and dry.     Findings: No bruising, lesion or rash.  Neurological:     General: No focal deficit present.     Mental Status: He is alert. Mental status is at baseline.     Cranial Nerves: No cranial nerve deficit.     Sensory: No sensory deficit.     Motor: No weakness.     Comments: GCS 15. AAOx4. Speech is goal oriented. No deficits appreciated to CN III-XII; symmetric eyebrow raise, no facial drooping, tongue midline. Patient has equal grip strength bilaterally with 5/5 strength against resistance in all major muscle groups bilaterally. Sensation to light touch intact. Patient moves extremities without ataxia. Normal nose-to-finger exam. Patient ambulatory with steady gait.   Psychiatric:        Mood and Affect: Mood normal.        Behavior: Behavior normal.     ED Results / Procedures / Treatments   Labs (all labs ordered are listed, but only abnormal results are displayed) Labs Reviewed  BASIC METABOLIC PANEL  CBC WITH DIFFERENTIAL/PLATELET    EKG EKG Interpretation  Date/Time:  Friday Dec 11 2022 09:12:09 EDT Ventricular Rate:  76 PR Interval:  122 QRS Duration: 78 QT Interval:  354 QTC Calculation: 398 R Axis:   68 Text  Interpretation: Sinus rhythm nonspecific t waves No significant change since prior 3/24 Confirmed by Meridee Score [phone]) on 12/11/2022 9:18:35 AM  Radiology No results found.  Procedures Procedures    Medications Ordered in ED Medications - No data to display  ED Course/ Medical Decision Making/ A&P                             Medical Decision Making  This patient presents to the ED after a fall, this involves an extensive number of treatment options, and is a  complaint that carries with it a high risk of complications and morbidity.  The differential diagnosis includes  intracranial hemorrhage, subdural/epidural hematoma, vertebral fracture, spinal cord injury, muscle strain, skull fracture, fracture.   Co morbidities that complicate the patient evaluation  seizures   Lab Tests:  I Ordered, and personally interpreted labs.  The pertinent results include:   -CBC: No concern for anemia or leukocytosis -BMP: no concern for electrolyte abnormality; no concern for kidney damage    Cardiac Monitoring: / EKG:  The patient was maintained on a cardiac monitor.  I personally viewed and interpreted the cardiac monitored which showed an underlying rhythm of: sinus rhythm without acute ST changes or arrhythmias    Problem List / ED Course / Critical interventions / Medication management  Patient presented for lightheadedness and mild headache. Patient with stable vitals and does not appear to be in distress. Patient had an unremarkable physical exam and a score of 0 for the Nexus C-spine and so imaging was not obtained. Patient without vomiting, LOC, signs of skull fracture, AMS, or neurodeficits after his seizure episode - so head imaging not obtained. Shared decision making with patient who agrees with no head imaging at this time. Patient will be encouraged to follow-up with primary care provider to be reevaluated in the next few days.  Labs and EKG reassuring, not showing any  reasons for patient's lightheadedness. Patient seen ambulating steadily to bathroom. I shared with patient that he might have a mild concussion if he hit his head during the last seizure. Shared with patient the importance of not exerting himself (i.e basketball) until symptoms resolve. Patient verbally endorsed understanding. Patient declined wanting anything for his headache stating that he can take his home dose of Advil.  Patient endorsing that he is ready to go home. Shared with patient that he should follow up with PCP this week and monitor for possible concussion symptoms.  I have reviewed the patients home medicines and have made adjustments as needed Patient was given return precautions. Patient stable for discharge at this time. Patient verbalized understanding of plan.   DDx: These are considered less likely due to history of present illness and physical exam findings -Intracranial hemorrhage, subdural/epidural hematoma: no neurodeficits -Vertebral fracture: no midline tenderness, no step-off/crepitus/abnormalities palpated -Spinal cord injury: Nexus C-spine score of 0, no neurodeficits -Skull fracture: No postauricular ecchymosis, no periorbital ecchymosis, no hemotympanum -Fracture: No step-offs/crepitus/abnormalities palpated in head, neck, chest, upper extremities, lower extremities, pelvis  Risk Stratification Score:  Nexus C-spine: 0   Social Determinants of Health:  none            Final Clinical Impression(s) / ED Diagnoses Final diagnoses:  Lightheadedness  Mild headache    Rx / DC Orders ED Discharge Orders     None         Dorthy Cooler, [place] 12/11/22 1046    Terrilee Files, MD 12/11/22 [phone]

## 2022-12-11 NOTE — Discharge Instructions (Addendum)
Im glad you are feeling better. Labs were reassuring and not showing any reasons for your lightheadedness. I recommend following up with your primary care provider this week to discuss your most recent seizure episode. It is important that you keep taking your seizure medication daily as prescribed.   Seek emergency care if experiencing new or worsening symptoms.

## 2022-12-11 NOTE — ED Triage Notes (Signed)
Pt reports three days ago falling off of the couch while sleeping. States his grandmother and mom put him back on the couch without his realizing it. Since then, has a bump on his head, headache, and intermittent light headedness and R sided numbness. States these symptoms last fore a couple of hours and then resolve.

## 2022-12-30 ENCOUNTER — Ambulatory Visit: Payer: BC Managed Care – PPO | Admitting: Surgical

## 2023-01-07 ENCOUNTER — Other Ambulatory Visit: Payer: Self-pay | Admitting: Orthopedic Surgery

## 2023-01-15 ENCOUNTER — Encounter: Payer: Self-pay | Admitting: Neurology

## 2023-01-15 ENCOUNTER — Ambulatory Visit: Payer: BC Managed Care – PPO | Admitting: Neurology

## 2023-01-15 DIAGNOSIS — Z029 Encounter for administrative examinations, unspecified: Secondary | ICD-10-CM

## 2023-01-20 ENCOUNTER — Other Ambulatory Visit: Payer: Self-pay

## 2023-01-20 ENCOUNTER — Emergency Department
Admission: EM | Admit: 2023-01-20 | Discharge: 2023-01-21 | Disposition: A | Discharge status: Home / Self Care | Payer: BC Managed Care – PPO | Attending: Emergency Medicine | Admitting: Emergency Medicine

## 2023-01-20 DIAGNOSIS — R4182 Altered mental status, unspecified: Secondary | ICD-10-CM | POA: Insufficient documentation

## 2023-01-20 DIAGNOSIS — R748 Abnormal levels of other serum enzymes: Secondary | ICD-10-CM | POA: Insufficient documentation

## 2023-01-20 DIAGNOSIS — D72819 Decreased white blood cell count, unspecified: Secondary | ICD-10-CM | POA: Diagnosis not present

## 2023-01-20 DIAGNOSIS — J4520 Mild intermittent asthma, uncomplicated: Secondary | ICD-10-CM | POA: Diagnosis not present

## 2023-01-20 DIAGNOSIS — R079 Chest pain, unspecified: Secondary | ICD-10-CM | POA: Diagnosis not present

## 2023-01-20 DIAGNOSIS — R42 Dizziness and giddiness: Secondary | ICD-10-CM | POA: Diagnosis not present

## 2023-01-20 NOTE — ED Triage Notes (Signed)
Pt to ED via [facility] c/o lightheadedness. Pt took benadryl, keppra, and klonopin PTA, says they are prescribed for allergies and seizures. Pt also complaining of CP that started while waiting to be triaged. Pt answering all questions appropriately, although a bit slow to do so sometimes and doesn't go into detail. Pt having some nystagmus, not opening his eyes much.

## 2023-01-20 NOTE — ED Triage Notes (Signed)
EMS brings pt in from home for "not feeling well"; keppra, benadryl & klonopin tonight PTA

## 2023-01-21 ENCOUNTER — Emergency Department: Payer: BC Managed Care – PPO

## 2023-01-21 DIAGNOSIS — R079 Chest pain, unspecified: Secondary | ICD-10-CM | POA: Diagnosis not present

## 2023-01-21 DIAGNOSIS — R4182 Altered mental status, unspecified: Secondary | ICD-10-CM | POA: Diagnosis not present

## 2023-01-21 LAB — COMPREHENSIVE METABOLIC PANEL
ALT: 20 U/L (ref 0–44)
AST: 30 U/L (ref 15–41)
Albumin: 4.4 g/dL (ref 3.5–5.0)
Alkaline Phosphatase: 80 U/L (ref 38–126)
Anion gap: 8 (ref 5–15)
BUN: 12 mg/dL (ref 6–20)
CO2: 24 mmol/L (ref 22–32)
Calcium: 9.5 mg/dL (ref 8.9–10.3)
Chloride: 105 mmol/L (ref 98–111)
Creatinine, Ser: 1.08 mg/dL (ref 0.61–1.24)
GFR, Estimated: >60 mL/min (ref >60)
Glucose, Bld: 90 mg/dL (ref 70–99)
Potassium: 3.6 mmol/L (ref 3.5–5.1)
Sodium: 137 mmol/L (ref 135–145)
Total Bilirubin: 0.9 mg/dL (ref 0.3–1.2)
Total Protein: 7.4 g/dL (ref 6.5–8.1)

## 2023-01-21 LAB — URINE DRUG SCREEN, QUALITATIVE (ARMC ONLY)
Amphetamines, Ur Screen: NOT DETECTED
Barbiturates, Ur Screen: NOT DETECTED
Benzodiazepine, Ur Scrn: NOT DETECTED
Cannabinoid 50 Ng, Ur ~~LOC~~: NOT DETECTED
Cocaine Metabolite,Ur ~~LOC~~: NOT DETECTED
MDMA (Ecstasy)Ur Screen: NOT DETECTED
Methadone Scn, Ur: NOT DETECTED
Opiate, Ur Screen: NOT DETECTED
Phencyclidine (PCP) Ur S: NOT DETECTED
Tricyclic, Ur Screen: NOT DETECTED

## 2023-01-21 LAB — URINALYSIS, ROUTINE W REFLEX MICROSCOPIC
Bacteria, UA: NONE SEEN
Bilirubin Urine: NEGATIVE
Glucose, UA: NEGATIVE mg/dL
Hgb urine dipstick: NEGATIVE
Ketones, ur: NEGATIVE mg/dL
Leukocytes,Ua: NEGATIVE
Nitrite: NEGATIVE
Protein, ur: 30 mg/dL — AB
Specific Gravity, Urine: 1.024 (ref 1.005–1.030)
pH: 6 (ref 5.0–8.0)

## 2023-01-21 LAB — CK: Total CK: 628 U/L — ABNORMAL HIGH (ref 49–397)

## 2023-01-21 LAB — CBC
HCT: 43.6 % (ref 39.0–52.0)
Hemoglobin: 14.6 g/dL (ref 13.0–17.0)
MCH: 28.2 pg (ref 26.0–34.0)
MCHC: 33.5 g/dL (ref 30.0–36.0)
MCV: 84.2 fL (ref 80.0–100.0)
Platelets: 166 10*3/uL (ref 150–400)
RBC: 5.18 MIL/uL (ref 4.22–5.81)
RDW: 12.2 % (ref 11.5–15.5)
WBC: 3.7 10*3/uL — ABNORMAL LOW (ref 4.0–10.5)
nRBC: 0 % (ref 0.0–0.2)

## 2023-01-21 LAB — TROPONIN I (HIGH SENSITIVITY)
Troponin I (High Sensitivity): 3 ng/L (ref <18)
Troponin I (High Sensitivity): 4 ng/L (ref <18)

## 2023-01-21 LAB — ETHANOL: Alcohol, Ethyl (B): <10 mg/dL (ref <10)

## 2023-01-21 MED ORDER — SODIUM CHLORIDE 0.9 % IV BOLUS
1000.0000 mL | Freq: Once | INTRAVENOUS | Status: AC
  Administered 2023-01-21: 1000 mL via INTRAVENOUS

## 2023-01-21 NOTE — ED Provider Notes (Signed)
[facility] Provider Note    Event Date/Time   First MD Initiated Contact with Patient 01/21/23 0001     (approximate)   History   Chest Pain and Dizziness   HPI  Level V caveat: Limited by decreased LOC  Terrence Riley is a 22 y.o. male brought to the ED via EMS from home with a chief complaint of lightheadedness.  Reportedly patient took Benadryl, Keppra and Klonopin prior to arrival.  Unknown quantity and what time he took those but reportedly told EMS they were prescribed for him for allergies and seizures.  He was also complaining of chest pain which began when he arrived to the emergency department.  He was slow to respond to triage nurse but did so appropriately.  Currently soundly asleep and unable to participate in interview.     Past Medical History   Past Medical History:  Diagnosis Date   Allergy    rhinitis   Asthma    as a child   Complication of anesthesia    pt. reports he need more anesthesia with the septoplasty surgery   Eosinophilic esophagitis    heartburn and reflux   Family history of adverse reaction to anesthesia    mother respiratory failure   Headache    Nasal fracture    deviated septum   Pneumonia    1x history of   Premature baby    2 months early   Seizures (HCC)    well controlled on meds/ one 2 months ago when meds were late 11/2021     Active Problem List   Patient Active Problem List   Diagnosis Date Noted   Traumatic complete tear of left rotator cuff 09/18/2022   Heterotopic ossification of bone 09/18/2022   Hypokalemia 06/26/2021   B12 deficiency 06/26/2021   Rhabdomyolysis 06/26/2021   Acute encephalopathy 06/20/2021   Aspiration pneumonia (HCC) 06/20/2021   Febrile illness    Shoulder instability, right    Humeral head fracture, right, with malunion, subsequent encounter    Esophagitis determined by endoscopy 07/24/2020   Degenerative tear of glenoid labrum of right shoulder 02/13/2020    Shoulder dislocation, left, subsequent encounter 02/13/2020   Seizure disorder (HCC) 12/22/2019   Migraines 12/22/2019   Mild intermittent asthma 12/22/2019   Migraine without aura and without status migrainosus, not intractable 06/29/2019   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   Altered mental status 03/12/2014   Lethargic 03/09/2014   Environmental allergies 03/01/2014   Gastroesophageal reflux disease with esophagitis 10/06/2013   Localization-related (focal) (partial) symptomatic epilepsy and epileptic syndromes with complex partial seizures, not intractable, without status epilepticus (HCC) 10/06/2013   Spells 07/26/2013   Allergy to other foods 07/13/2013   Prophylactic immunotherapy 07/13/2013   Unspecified asthma, uncomplicated 07/13/2013   Eosinophilic esophagitis 01/23/2013   Gastroesophageal reflux 01/23/2013   Vomiting 01/23/2013   Leukopenia 10/19/2012   Muscle jerks during sleep 10/19/2012   Constipation 08/20/2012   Neutropenia (HCC) 08/20/2012   Allergic rhinitis due to pollen 02/19/2011     Past Surgical History   Past Surgical History:  Procedure Laterality Date   ADENOIDECTOMY     CLOSED REDUCTION NASAL FRACTURE N/A 08/31/2017   Procedure: CLOSED REDUCTION NASAL FRACTURE;  Surgeon: Geanie Logan, MD;  Location: [facility];  Service: ENT;  Laterality: N/A;   HARDWARE REMOVAL Right 09/17/2022   Procedure: HARDWARE REMOVAL;  Surgeon: Cammy Copa, MD;  Location: [facility] OR;  Service: Orthopedics;  Laterality: Right;  PROVIDER REQUESTING  2.0 HOURS OPERATING TIME   ORIF HUMERUS FRACTURE Right 05/05/2021   Procedure: reverse Hill-Sachs allografting, biceps tenodesis;  Surgeon: Cammy Copa, MD;  Location: [facility] OR;  Service: Orthopedics;  Laterality: Right;   SEPTOPLASTY N/A 08/31/2017   Procedure: SEPTOPLASTY;  Surgeon: Geanie Logan, MD;  Location: [facility];  Service: ENT;  Laterality: N/A;   SHOULDER ARTHROSCOPY WITH LABRAL REPAIR Left  04/30/2020   Procedure: LEFT SHOULDER POSTERIOR LABRAL REPAIR WITH ARTHROSCOPY, BICEPS TENDON RELEASE AND TENODESIS, OPEN ALLOGRAFT FOR REVERSE BANKART LESION;  Surgeon: Cammy Copa, MD;  Location: [facility] OR;  Service: Orthopedics;  Laterality: Left;   SHOULDER ARTHROSCOPY WITH LABRAL REPAIR Right 05/05/2021   Procedure: right shoulder arthroscopy, biceps release, posterior labral repair;;  Surgeon: Cammy Copa, MD;  Location: [facility] OR;  Service: Orthopedics;  Laterality: Right;   SHOULDER ARTHROSCOPY WITH SUBACROMIAL DECOMPRESSION AND BICEP TENDON REPAIR Right 09/17/2022   Procedure: RIGHT SHOULDER ARTHROSCOPY, SUBSCAPULARIS REPAIR;  Surgeon: Cammy Copa, MD;  Location: [facility] OR;  Service: Orthopedics;  Laterality: Right;   TONSILLECTOMY     and addenoids   UPPER GASTROINTESTINAL ENDOSCOPY  05/15/2022     Home Medications   Prior to Admission medications   Medication Sig Start Date End Date Taking? Authorizing Provider  oxycodone (OXY-IR) 5 MG capsule Take 1 capsule (5 mg total) by mouth every 4 (four) hours as needed. 09/17/22   Cammy Copa, MD  celecoxib (CELEBREX) 100 MG capsule TAKE 1 CAPSULE BY MOUTH TWICE A DAY 01/07/23   Magnant, Joycie Peek, PA-C  clonazePAM (KLONOPIN) 0.5 MG tablet Take 1 tablet every night 09/16/22   Van Clines, MD  cyanocobalamin (,VITAMIN B-12,) 1000 MCG/ML injection Inject 1 mL (1,000 mcg total) into the muscle every 30 (thirty) days. 06/27/21   Leatha Gilding, MD  EPINEPHrine 0.3 mg/0.3 mL IJ SOAJ injection Inject 0.3 mg into the muscle as needed for anaphylaxis. 07/10/20   McDonald, Mia A, PA-C  KEPPRA 1000 MG tablet Take 1 tablet in AM, 2 tablets in PM 09/16/22   Van Clines, MD  methocarbamol (ROBAXIN) 500 MG tablet Take 1 tablet (500 mg total) by mouth every 8 (eight) hours as needed for muscle spasms. 09/17/22   Cammy Copa, MD  mirtazapine (REMERON SOL-TAB) 15 MG disintegrating tablet Take 1 tablet (15 mg total) by mouth at  bedtime. 10/07/22   Burchette, Elberta Fortis, MD  oxyCODONE (OXY IR/ROXICODONE) 5 MG immediate release tablet Take 1 tablet (5 mg total) by mouth every 4 (four) hours as needed for severe pain. 09/17/22   Cammy Copa, MD     Allergies  Lortab [hydrocodone-acetaminophen], Other, Zofran Frazier Richards hcl], Citrus, Dairy aid [tilactase], Influenza virus vaccine, and Soy allergy   Family History   Family History  Problem Relation Age of Onset   Cancer Mother        breat   Clotting disorder Mother    Protein C deficiency Sister    Stomach cancer Neg Hx    Esophageal cancer Neg Hx    Colon cancer Neg Hx      Physical Exam  Triage Vital Signs: ED Triage Vitals  Enc Vitals Group     BP 01/20/23 2353 116/73     Pulse Rate 01/20/23 2353 74     Resp 01/20/23 2353 15     Temp 01/20/23 2353 98.8 F (37.1 C)     Temp Source 01/20/23 2353 Oral     SpO2 01/20/23 2318 100 %  Weight --      Height --      Head Circumference --      Peak Flow --      Pain Score 01/20/23 2353 6     Pain Loc --      Pain Edu? --      Excl. in GC? --     Updated Vital Signs: BP 120/71 (BP Location: Left Arm)   Pulse (!) 56   Temp 98.8 F (37.1 C) (Oral)   Resp 19   SpO2 100%    General: Asleep, no distress.   CV:  RRR.  Good peripheral perfusion.  Resp:  Normal effort.  CTAB. Abd:  Nontender.  No distention.  Other:  PERRL.  Arouses to painful stimuli.   ED Results / Procedures / Treatments  Labs (all labs ordered are listed, but only abnormal results are displayed) Labs Reviewed  CBC - Abnormal; Notable for the following components:      Result Value   WBC 3.7 (*)    All other components within normal limits  URINALYSIS, ROUTINE W REFLEX MICROSCOPIC - Abnormal; Notable for the following components:   Color, Urine YELLOW (*)    APPearance CLEAR (*)    Protein, ur 30 (*)    All other components within normal limits  CK - Abnormal; Notable for the following components:   Total CK  628 (*)    All other components within normal limits  COMPREHENSIVE METABOLIC PANEL  ETHANOL  URINE DRUG SCREEN, QUALITATIVE ([facility] ONLY)  LEVETIRACETAM LEVEL  TROPONIN I (HIGH SENSITIVITY)  TROPONIN I (HIGH SENSITIVITY)     EKG  ED ECG REPORT I, Chitara Clonch J, the attending physician, personally viewed and interpreted this ECG.   Date: 01/21/2023  EKG Time: 2352  Rate: 86  Rhythm: normal sinus rhythm  Axis: Normal  Intervals:none  ST&T Change: Nonspecific    RADIOLOGY I have independently visualized and interpreted the patient's x-ray and CT scan as well as noted the radiology interpretation:  Chest x-ray: No acute cardiopulmonary process  CT head: No ICH  Official radiology report(s): CT Head Wo Contrast  Result Date: 01/21/2023 CLINICAL DATA:  Mental status change, unknown cause EXAM: CT HEAD WITHOUT CONTRAST TECHNIQUE: Contiguous axial images were obtained from the base of the skull through the vertex without intravenous contrast. RADIATION DOSE REDUCTION: This exam was performed according to the departmental dose-optimization program which includes automated exposure control, adjustment of the mA and/or kV according to patient size and/or use of iterative reconstruction technique. COMPARISON:  CT head 01/08/2022 FINDINGS: Brain: No evidence of large-territorial acute infarction. No parenchymal hemorrhage. No mass lesion. No extra-axial collection. No mass effect or midline shift. No hydrocephalus. Basilar cisterns are patent. Vascular: No hyperdense vessel. Skull: No acute fracture or focal lesion. Sinuses/Orbits: Paranasal sinuses and mastoid air cells are clear. The orbits are unremarkable. Other: None. IMPRESSION: No acute intracranial abnormality. Electronically Signed   By: Tish Frederickson M.D.   On: 01/21/2023 01:58   DG Chest 2 View  Result Date: 01/21/2023 CLINICAL DATA:  cp EXAM: CHEST - 2 VIEW COMPARISON:  Chest x-ray 10/22/2022 FINDINGS: The heart and mediastinal  contours are unchanged. Biapical pleural/pulmonary scarring. No focal consolidation. No pulmonary edema. No pleural effusion. No pneumothorax. No acute osseous abnormality. IMPRESSION: No active cardiopulmonary disease. Electronically Signed   By: Tish Frederickson M.D.   On: 01/21/2023 00:52     PROCEDURES:  Critical Care performed: No  .1-3 Lead EKG Interpretation  Performed  by: Irean Hong, MD Authorized by: Irean Hong, MD     Interpretation: normal     ECG rate:  85   ECG rate assessment: normal     Rhythm: sinus rhythm     Ectopy: none     Conduction: normal   Comments:     Patient placed on cardiac monitor to evaluate for arrhythmias    MEDICATIONS ORDERED IN ED: Medications  sodium chloride 0.9 % bolus 1,000 mL (0 mLs Intravenous Stopped 01/21/23 0242)     IMPRESSION / MDM / ASSESSMENT AND PLAN / ED COURSE  I reviewed the triage vital signs and the nursing notes.                             22 year old male presenting for lightheadedness after taking Benadryl, Keppra and Klonopin. Differential diagnosis includes, but is not limited to, alcohol, illicit or prescription medications, or other toxic ingestion; intracranial pathology such as stroke or intracerebral hemorrhage; fever or infectious causes including sepsis; hypoxemia and/or hypercarbia; uremia; trauma; endocrine related disorders such as diabetes, hypoglycemia, and thyroid-related diseases; hypertensive encephalopathy, seizure; etc. I personally reviewed patient's records and notes an ED visit for lightheadedness after experiencing a seizure on 12/11/2022  Patient's presentation is most consistent with acute presentation with potential threat to life or bodily function.  The patient is on the cardiac monitor to evaluate for evidence of arrhythmia and/or significant heart rate changes.  Laboratory results demonstrate mild leukopenia with WBC 3.7, normal electrolytes, mildly elevated CK.  EtOH and troponin negative.   Will initiate IV fluids and continue to monitor and care for patient.  Clinical Course as of 01/21/23 0614  Thu Jan 21, 2023  0146 Patient's mother called to check on him.  She told the nurse that patient did not have a seizure tonight.  They were worried he was having an allergic reaction to watermelon so she gave him 2 Benadryl's.  Subsequently he took his nighttime medications which included Keppra and Klonopin.  Patient remains sleeping but is more arousable. [JS]  [id number] Patient awake, drinking some ginger ale.  Updated him of all test results thus far including negative CT head.  Denies intentional ingestion.  Awaiting repeat troponin. [JS]  0315 Repeat troponin, UA and UDS negative.  Patient has eaten a sandwich tray, ambulating with steady gait.  Strict return precautions given.  Patient verbalizes understanding and agrees with plan of care. [JS]    Clinical Course User Index [JS] Irean Hong, MD     FINAL CLINICAL IMPRESSION(S) / ED DIAGNOSES   Final diagnoses:  Altered mental status, unspecified altered mental status type     Rx / DC Orders   ED Discharge Orders     None        Note:  This document was prepared using Dragon voice recognition software and may include unintentional dictation errors.   Irean Hong, MD 01/21/23 [phone]

## 2023-01-21 NOTE — Discharge Instructions (Signed)
Take your medicines only as prescribed.  Return to the ER for worsening symptoms, persistent vomiting, difficulty breathing or other concerns.

## 2023-01-22 ENCOUNTER — Ambulatory Visit: Payer: BC Managed Care – PPO | Admitting: Family Medicine

## 2023-01-22 LAB — LEVETIRACETAM LEVEL: Levetiracetam Lvl: 53.3 ug/mL — ABNORMAL HIGH (ref 10.0–40.0)

## 2023-01-26 ENCOUNTER — Telehealth: Payer: BC Managed Care – PPO | Admitting: Family Medicine

## 2023-02-08 ENCOUNTER — Ambulatory Visit: Payer: BC Managed Care – PPO | Admitting: Family Medicine

## 2023-02-08 ENCOUNTER — Encounter: Payer: Self-pay | Admitting: Family Medicine

## 2023-02-08 VITALS — BP 120/72 | HR 61 | Temp 98.0°F | Ht 72.0 in | Wt 125.6 lb

## 2023-02-08 DIAGNOSIS — G4700 Insomnia, unspecified: Secondary | ICD-10-CM | POA: Diagnosis not present

## 2023-02-08 DIAGNOSIS — N529 Male erectile dysfunction, unspecified: Secondary | ICD-10-CM

## 2023-02-08 DIAGNOSIS — R4182 Altered mental status, unspecified: Secondary | ICD-10-CM

## 2023-02-08 NOTE — Patient Instructions (Signed)
Try to avoid benadryl  Consider safer alternative such as Allegra, Zyrtec, or Xyzal.

## 2023-02-08 NOTE — Progress Notes (Signed)
Established Patient Office Visit  Subjective   Patient ID: Terrence Riley, male    DOB: 11-May-2001  Age: 22 y.o. MRN: [id number]  Chief Complaint  Patient presents with   Medical Management of Chronic Issues    HPI   Terrence Riley is here for medical follow-up and for ER follow-up following altered mental status.  He has history of migraine headaches, eosinophilic esophagitis, GERD, seizure disorder.  He takes Keppra for seizures and states has not had any recent seizure type activity.  Was seen in the ER at [facility] June 27 with some lightheadedness.  He apparently taken Benadryl in addition to Keppra and Klonopin prior to arrival.  Patient states he took three 25 mg Benadryl for allergies.  He was apparently very sedated when he got to the ER.  ER evaluation reviewed.  He had chest x-ray which is unremarkable.  CT head without contrast without acute findings.  CBC remarkable only for white count of 3.7 thousand which is near his baseline.  Chemistries and ethanol level normal.  Keppra level is 53.  Urine drug screen negative.  Urinalysis unremarkable.  He was given some IV fluids and observed and improved and was sent home.  Has had no further issues since then.  Does complain of some chronic insomnia.  He apparently sleeps much of the day and awake most the night.  Does take low-dose Klonopin at night per neurology.  Drink some Sprite and occasional colas but no other caffeine sources.  No alcohol.  No illicit drug use.  He hopes to start back school this fall at [employer] and knows he needs to get on better sleep cycle  He also complains of some occasional erectile dysfunction.  He thinks this may be stress related.  Is inquiring about testosterone.  Not aware if this has been checked previously.  Non-smoker.  Able to get erection but sometimes has difficulty maintaining.  Past Medical History:  Diagnosis Date   Allergy    rhinitis   Asthma    as a child    Complication of anesthesia    pt. reports he need more anesthesia with the septoplasty surgery   Eosinophilic esophagitis    heartburn and reflux   Family history of adverse reaction to anesthesia    mother respiratory failure   Headache    Nasal fracture    deviated septum   Pneumonia    1x history of   Premature baby    2 months early   Seizures (HCC)    well controlled on meds/ one 2 months ago when meds were late 11/2021   Past Surgical History:  Procedure Laterality Date   ADENOIDECTOMY     CLOSED REDUCTION NASAL FRACTURE N/A 08/31/2017   Procedure: CLOSED REDUCTION NASAL FRACTURE;  Surgeon: Geanie Logan, MD;  Location: [facility];  Service: ENT;  Laterality: N/A;   HARDWARE REMOVAL Right 09/17/2022   Procedure: HARDWARE REMOVAL;  Surgeon: Cammy Copa, MD;  Location: [facility] OR;  Service: Orthopedics;  Laterality: Right;  PROVIDER REQUESTING 2.0 HOURS OPERATING TIME   ORIF HUMERUS FRACTURE Right 05/05/2021   Procedure: reverse Hill-Sachs allografting, biceps tenodesis;  Surgeon: Cammy Copa, MD;  Location: [facility] OR;  Service: Orthopedics;  Laterality: Right;   SEPTOPLASTY N/A 08/31/2017   Procedure: SEPTOPLASTY;  Surgeon: Geanie Logan, MD;  Location: [facility];  Service: ENT;  Laterality: N/A;   SHOULDER ARTHROSCOPY WITH LABRAL REPAIR Left 04/30/2020   Procedure: LEFT SHOULDER  POSTERIOR LABRAL REPAIR WITH ARTHROSCOPY, BICEPS TENDON RELEASE AND TENODESIS, OPEN ALLOGRAFT FOR REVERSE BANKART LESION;  Surgeon: Cammy Copa, MD;  Location: [facility] OR;  Service: Orthopedics;  Laterality: Left;   SHOULDER ARTHROSCOPY WITH LABRAL REPAIR Right 05/05/2021   Procedure: right shoulder arthroscopy, biceps release, posterior labral repair;;  Surgeon: Cammy Copa, MD;  Location: [facility] OR;  Service: Orthopedics;  Laterality: Right;   SHOULDER ARTHROSCOPY WITH SUBACROMIAL DECOMPRESSION AND BICEP TENDON REPAIR Right 09/17/2022   Procedure: RIGHT SHOULDER  ARTHROSCOPY, SUBSCAPULARIS REPAIR;  Surgeon: Cammy Copa, MD;  Location: [facility] OR;  Service: Orthopedics;  Laterality: Right;   TONSILLECTOMY     and addenoids   UPPER GASTROINTESTINAL ENDOSCOPY  05/15/2022    reports that he has never smoked. He has never used smokeless tobacco. He reports that he does not drink alcohol and does not use drugs. family history includes Cancer in his mother; Clotting disorder in his mother; Protein C deficiency in his sister. Allergies  Allergen Reactions   Lortab [Hydrocodone-Acetaminophen] Hives   Other Anaphylaxis    Peanuts   Zofran [Ondansetron Hcl] Nausea And Vomiting   Citrus Other (See Comments)    Sores in mouth   Dairy Aid [Tilactase] Nausea And Vomiting   Influenza Virus Vaccine Other (See Comments)    Partial paralysis for a couple of weeks per mom   Soy Allergy Other (See Comments)    Seizures     Review of Systems  Constitutional:  Negative for chills, fever and malaise/fatigue.  Eyes:  Negative for blurred vision.  Respiratory:  Negative for shortness of breath.   Cardiovascular:  Negative for chest pain.  Neurological:  Negative for dizziness, weakness and headaches.  Psychiatric/Behavioral:  Negative for depression. The patient has insomnia.       Objective:     BP 120/72 (BP Location: Left Arm, Patient Position: Sitting, Cuff Size: Normal)   Pulse 61   Temp 98 F (36.7 C) (Oral)   Ht 6' (1.829 m)   Wt 125 lb 9.6 oz (57 kg)   SpO2 99%   BMI 17.03 kg/m  BP Readings from Last 3 Encounters:  02/08/23 120/72  01/21/23 120/71  12/11/22 125/71   Wt Readings from Last 3 Encounters:  02/08/23 125 lb 9.6 oz (57 kg)  09/17/22 123 lb (55.8 kg)  09/16/22 126 lb 14.4 oz (57.6 kg)      Physical Exam Vitals reviewed.  Constitutional:      Appearance: He is well-developed.  Eyes:     Pupils: Pupils are equal, round, and reactive to light.  Neck:     Thyroid: No thyromegaly.  Cardiovascular:     Rate and Rhythm:  Normal rate and regular rhythm.  Pulmonary:     Effort: Pulmonary effort is normal. No respiratory distress.     Breath sounds: Normal breath sounds. No wheezing or rales.  Musculoskeletal:     Cervical back: Neck supple.  Neurological:     General: No focal deficit present.     Mental Status: He is alert and oriented to person, place, and time.     Cranial Nerves: No cranial nerve deficit.  Psychiatric:        Mood and Affect: Mood normal.      No results found for any visits on 02/08/23.  Last CBC Lab Results  Component Value Date   WBC 3.7 (L) 01/21/2023   HGB 14.6 01/21/2023   HCT 43.6 01/21/2023   MCV 84.2 01/21/2023   MCH  28.2 01/21/2023   RDW 12.2 01/21/2023   PLT 166 01/21/2023   Last metabolic panel Lab Results  Component Value Date   GLUCOSE 90 01/21/2023   NA 137 01/21/2023   K 3.6 01/21/2023   CL 105 01/21/2023   CO2 24 01/21/2023   BUN 12 01/21/2023   CREATININE 1.08 01/21/2023   GFRNONAA >60 01/21/2023   CALCIUM 9.5 01/21/2023   PROT 7.4 01/21/2023   ALBUMIN 4.4 01/21/2023   BILITOT 0.9 01/21/2023   ALKPHOS 80 01/21/2023   AST 30 01/21/2023   ALT 20 01/21/2023   ANIONGAP 8 01/21/2023   Last thyroid functions Lab Results  Component Value Date   TSH 0.62 02/20/2022      The ASCVD Risk score (Arnett DK, et al., 2019) failed to calculate for the following reasons:   The 2019 ASCVD risk score is only valid for ages 3 to 53    Assessment & Plan:   #1 recent transient altered mental status.  Evaluation as above unremarkable.  Probably related to polypharmacy with combination of Benadryl along with Keppra and Klonopin.  His workup was unrevealing.  CT head unremarkable.  We recommend try to avoid Benadryl as much as possible.  Try instead over-the-counter Zyrtec, Allegra, or Xyzal for allergy symptoms.  Recent Keppra level 53.  Continue close follow-up with neurology  #2 chronic insomnia.  Discussed sleep hygiene at some length.  Handout given.   Try to avoid sedative hypnotics as much as possible.  #3 erectile dysfunction.  Patient has good libido.  Will check early morning testosterone level.  Future lab order placed for that.  Doubt vascular in nature with him not being smoker and no history of diabetes.  If testosterone level normal consider trial of Viagra or Cialis as needed   No follow-ups on file.    Evelena Peat, MD

## 2023-02-10 ENCOUNTER — Other Ambulatory Visit (INDEPENDENT_AMBULATORY_CARE_PROVIDER_SITE_OTHER): Payer: BC Managed Care – PPO

## 2023-02-10 ENCOUNTER — Encounter: Payer: Self-pay | Admitting: Family Medicine

## 2023-02-10 DIAGNOSIS — R630 Anorexia: Secondary | ICD-10-CM | POA: Diagnosis not present

## 2023-02-10 DIAGNOSIS — N529 Male erectile dysfunction, unspecified: Secondary | ICD-10-CM

## 2023-02-10 DIAGNOSIS — R5383 Other fatigue: Secondary | ICD-10-CM | POA: Diagnosis not present

## 2023-02-10 DIAGNOSIS — R7989 Other specified abnormal findings of blood chemistry: Secondary | ICD-10-CM | POA: Diagnosis not present

## 2023-02-10 LAB — COMPREHENSIVE METABOLIC PANEL
ALT: 17 U/L (ref 0–53)
AST: 26 U/L (ref 0–37)
Albumin: 4.5 g/dL (ref 3.5–5.2)
Alkaline Phosphatase: 75 U/L (ref 39–117)
BUN: 8 mg/dL (ref 6–23)
CO2: 27 mEq/L (ref 19–32)
Calcium: 9.7 mg/dL (ref 8.4–10.5)
Chloride: 104 mEq/L (ref 96–112)
Creatinine, Ser: 1.1 mg/dL (ref 0.40–1.50)
GFR: 95.22 mL/min (ref >60.00)
Glucose, Bld: 86 mg/dL (ref 70–99)
Potassium: 3.8 mEq/L (ref 3.5–5.1)
Sodium: 140 mEq/L (ref 135–145)
Total Bilirubin: 0.7 mg/dL (ref 0.2–1.2)
Total Protein: 6.8 g/dL (ref 6.0–8.3)

## 2023-02-10 LAB — CBC WITH DIFFERENTIAL/PLATELET
Basophils Absolute: 0.1 10*3/uL (ref 0.0–0.1)
Basophils Relative: 1.6 % (ref 0.0–3.0)
Eosinophils Absolute: 0.2 10*3/uL (ref 0.0–0.7)
Eosinophils Relative: 6.1 % — ABNORMAL HIGH (ref 0.0–5.0)
HCT: 40.6 % (ref 39.0–52.0)
Hemoglobin: 13.5 g/dL (ref 13.0–17.0)
Lymphocytes Relative: 43.8 % (ref 12.0–46.0)
Lymphs Abs: 1.6 10*3/uL (ref 0.7–4.0)
MCHC: 33.1 g/dL (ref 30.0–36.0)
MCV: 85.6 fl (ref 78.0–100.0)
Monocytes Absolute: 0.3 10*3/uL (ref 0.1–1.0)
Monocytes Relative: 8.9 % (ref 3.0–12.0)
Neutro Abs: 1.4 10*3/uL (ref 1.4–7.7)
Neutrophils Relative %: 39.6 % — ABNORMAL LOW (ref 43.0–77.0)
Platelets: 159 10*3/uL (ref 150.0–400.0)
RBC: 4.75 Mil/uL (ref 4.22–5.81)
RDW: 13.3 % (ref 11.5–15.5)
WBC: 3.6 10*3/uL — ABNORMAL LOW (ref 4.0–10.5)

## 2023-02-10 LAB — VITAMIN D 25 HYDROXY (VIT D DEFICIENCY, FRACTURES): VITD: 16.48 ng/mL — ABNORMAL LOW (ref 30.00–100.00)

## 2023-02-10 LAB — TESTOSTERONE: Testosterone: 620.2 ng/dL (ref 300.00–890.00)

## 2023-02-10 LAB — SEDIMENTATION RATE: Sed Rate: 2 mm/hr (ref 0–15)

## 2023-02-10 MED ORDER — SILDENAFIL CITRATE 100 MG PO TABS: ORAL_TABLET | ORAL | 5 refills | Status: DC

## 2023-02-11 ENCOUNTER — Telehealth: Payer: Self-pay | Admitting: Family Medicine

## 2023-02-11 MED ORDER — VITAMIN D (ERGOCALCIFEROL) 1.25 MG (50000 UNIT) PO CAPS: 50000.0000 [IU] | ORAL_CAPSULE | ORAL | 0 refills | Status: DC

## 2023-02-11 NOTE — Telephone Encounter (Signed)
Pt can't take Vitamin D, Ergocalciferol, (DRISDOL) 1.25 MG (50000 UNIT) CAPS capsule because it's soy based and he has an allergy. Asked where he had gotten this filled previously and he couldn't recall

## 2023-02-12 MED ORDER — TADALAFIL 20 MG PO TABS: ORAL_TABLET | ORAL | 2 refills | Status: DC

## 2023-02-12 NOTE — Telephone Encounter (Signed)
Left a detailed message with the information below at the patient's cell number. ?

## 2023-02-12 NOTE — Telephone Encounter (Signed)
I spoke with Terrence Riley at [facility] on [address] and she confirmed that the patient has transferred medication to another [facility] but Viagra has not been picked up yet. RX was sent

## 2023-02-12 NOTE — Addendum Note (Signed)
Addended by: Christy Sartorius on: 02/12/2023 11:35 AM   Modules accepted: Orders

## 2023-02-17 ENCOUNTER — Telehealth: Payer: Self-pay | Admitting: Neurology

## 2023-02-17 NOTE — Telephone Encounter (Signed)
Called and left message to call office back

## 2023-02-17 NOTE — Telephone Encounter (Signed)
Patient said he thinks he put [place]'s address on the form for [facility] instead of his address. It was a form for him to get tinted windows approved

## 2023-02-19 NOTE — Telephone Encounter (Signed)
Left message on machine for patient to call back.

## 2023-02-22 NOTE — Telephone Encounter (Signed)
Pt called informed that copy of form is a the front desk for him

## 2023-03-01 ENCOUNTER — Encounter: Payer: Self-pay | Admitting: Family Medicine

## 2023-03-01 ENCOUNTER — Ambulatory Visit (INDEPENDENT_AMBULATORY_CARE_PROVIDER_SITE_OTHER): Payer: BC Managed Care – PPO | Admitting: Family Medicine

## 2023-03-01 VITALS — BP 110/74 | HR 51 | Temp 97.9°F | Ht 72.0 in | Wt 127.9 lb

## 2023-03-01 DIAGNOSIS — M7752 Other enthesopathy of left foot: Secondary | ICD-10-CM | POA: Diagnosis not present

## 2023-03-01 NOTE — Patient Instructions (Signed)
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  Try OTC Voltaren/Diclofenac cream

## 2023-03-01 NOTE — Progress Notes (Signed)
Established Patient Office Visit  Subjective   Patient ID: Terrence Riley, male    DOB: May 10, 2001  Age: 22 y.o. MRN: [id number]  Chief Complaint  Patient presents with   Leg Pain    Patient complains of left leg pain, x2 weeks, Tried Ibuprofen     HPI   Seen today with left heel pain.  Started about 2 and half weeks ago.  Denies any injury.  He is playing basketball frequently but no sudden pain.  He has pain intermittently and sometimes at rest.  Able to tolerate running generally.  Seems to improve some after he gets warmed up.  He tried some IcyHot without improvement.  Has not seen any swelling.  No bruising.  Location is retrocalcaneal region.  No ankle pain.  He is currently attending [employer] and hopes to play basketball this fall.  He has tryouts next month and will need physical form completed for that and plans to set that up soon  Past Medical History:  Diagnosis Date   Allergy    rhinitis   Asthma    as a child   Complication of anesthesia    pt. reports he need more anesthesia with the septoplasty surgery   Eosinophilic esophagitis    heartburn and reflux   Family history of adverse reaction to anesthesia    mother respiratory failure   Headache    Nasal fracture    deviated septum   Pneumonia    1x history of   Premature baby    2 months early   Seizures (HCC)    well controlled on meds/ one 2 months ago when meds were late 11/2021   Past Surgical History:  Procedure Laterality Date   ADENOIDECTOMY     CLOSED REDUCTION NASAL FRACTURE N/A 08/31/2017   Procedure: CLOSED REDUCTION NASAL FRACTURE;  Surgeon: Geanie Logan, MD;  Location: [facility];  Service: ENT;  Laterality: N/A;   HARDWARE REMOVAL Right 09/17/2022   Procedure: HARDWARE REMOVAL;  Surgeon: Cammy Copa, MD;  Location: [facility] OR;  Service: Orthopedics;  Laterality: Right;  PROVIDER REQUESTING 2.0 HOURS OPERATING TIME   ORIF HUMERUS FRACTURE Right  05/05/2021   Procedure: reverse Hill-Sachs allografting, biceps tenodesis;  Surgeon: Cammy Copa, MD;  Location: [facility] OR;  Service: Orthopedics;  Laterality: Right;   SEPTOPLASTY N/A 08/31/2017   Procedure: SEPTOPLASTY;  Surgeon: Geanie Logan, MD;  Location: [facility];  Service: ENT;  Laterality: N/A;   SHOULDER ARTHROSCOPY WITH LABRAL REPAIR Left 04/30/2020   Procedure: LEFT SHOULDER POSTERIOR LABRAL REPAIR WITH ARTHROSCOPY, BICEPS TENDON RELEASE AND TENODESIS, OPEN ALLOGRAFT FOR REVERSE BANKART LESION;  Surgeon: Cammy Copa, MD;  Location: [facility] OR;  Service: Orthopedics;  Laterality: Left;   SHOULDER ARTHROSCOPY WITH LABRAL REPAIR Right 05/05/2021   Procedure: right shoulder arthroscopy, biceps release, posterior labral repair;;  Surgeon: Cammy Copa, MD;  Location: [facility] OR;  Service: Orthopedics;  Laterality: Right;   SHOULDER ARTHROSCOPY WITH SUBACROMIAL DECOMPRESSION AND BICEP TENDON REPAIR Right 09/17/2022   Procedure: RIGHT SHOULDER ARTHROSCOPY, SUBSCAPULARIS REPAIR;  Surgeon: Cammy Copa, MD;  Location: [facility] OR;  Service: Orthopedics;  Laterality: Right;   TONSILLECTOMY     and addenoids   UPPER GASTROINTESTINAL ENDOSCOPY  05/15/2022    reports that he has never smoked. He has never used smokeless tobacco. He reports that he does not drink alcohol and does not use drugs. family history includes Cancer in his mother; Clotting disorder in his  mother; Protein C deficiency in his sister. Allergies  Allergen Reactions   Lortab [Hydrocodone-Acetaminophen] Hives   Other Anaphylaxis    Peanuts   Zofran [Ondansetron Hcl] Nausea And Vomiting   Citrus Other (See Comments)    Sores in mouth   Dairy Aid [Tilactase] Nausea And Vomiting   Influenza Virus Vaccine Other (See Comments)    Partial paralysis for a couple of weeks per mom   Soy Allergy Other (See Comments)    Seizures     Review of Systems  Neurological:  Negative for focal weakness.       Objective:     BP 110/74 (BP Location: Left Arm, Patient Position: Sitting, Cuff Size: Normal)   Pulse (!) 51   Temp 97.9 F (36.6 C) (Oral)   Ht 6' (1.829 m)   Wt 127 lb 14.4 oz (58 kg)   SpO2 99%   BMI 17.35 kg/m  BP Readings from Last 3 Encounters:  03/01/23 110/74  02/08/23 120/72  01/21/23 120/71   Wt Readings from Last 3 Encounters:  03/01/23 127 lb 14.4 oz (58 kg)  02/08/23 125 lb 9.6 oz (57 kg)  09/17/22 123 lb (55.8 kg)      Physical Exam Vitals reviewed.  Constitutional:      Appearance: Normal appearance.  Cardiovascular:     Rate and Rhythm: Normal rate and regular rhythm.  Musculoskeletal:     Comments: Left foot and ankle examined.  No ecchymosis.  No visible swelling.  Full range of motion of the ankle.  Full range of motion with plantarflexion and dorsiflexion.  Achilles tendon is nontender to palpation even at the attachment to the calcaneus.  Just inferior to this in the retrocalcaneal region he has some mild tenderness.  No warmth or erythema.  No plantar fascia tenderness.  No pain with plantarflexion or dorsiflexion.  No pain with inversion or eversion.  Neurological:     Mental Status: He is alert.      No results found for any visits on 03/01/23.    The ASCVD Risk score (Arnett DK, et al., 2019) failed to calculate for the following reasons:   The 2019 ASCVD risk score is only valid for ages 72 to 53    Assessment & Plan:   Left foot pain.  Suspect retrocalcaneal bursitis.  He has pain and tenderness which are actually inferior to the attachment of the Achilles tendon.  Achilles tendon is intact.  -Recommend icing 15 to 20 minutes 2-3 times daily -Consider diclofenac gel 3-4 times daily -Recommend gentle Achilles stretches several times daily -Touch base for any persistent or worsening symptoms -He also has some leftover Celebrex from prior shoulder surgery and will get back on that 100 mg twice daily for the next couple weeks   Evelena Peat, MD

## 2023-03-03 ENCOUNTER — Encounter: Payer: BC Managed Care – PPO | Admitting: Family Medicine

## 2023-03-03 ENCOUNTER — Ambulatory Visit (INDEPENDENT_AMBULATORY_CARE_PROVIDER_SITE_OTHER): Payer: BC Managed Care – PPO | Admitting: Family Medicine

## 2023-03-03 VITALS — BP 110/60 | HR 60 | Temp 98.4°F | Ht 73.0 in | Wt 127.3 lb

## 2023-03-03 DIAGNOSIS — Z0000 Encounter for general adult medical examination without abnormal findings: Secondary | ICD-10-CM

## 2023-03-03 NOTE — Progress Notes (Signed)
Established Patient Office Visit  Subjective   Patient ID: ALEGEND LIESKE, male    DOB: 03/21/2001  Age: 22 y.o. MRN: [id number]  Chief Complaint  Patient presents with   Annual Exam    Fasting CPE. Form completion for school.    HPI   Here for sports physical/well visit.  Attending [employer].  Plans to play basketball.  He had previous right shoulder surgery with labrum tear.  Has been cleared per orthopedics to play.  No history of syncope with exercise or any dizziness or chest pains.  No known family history of congenital heart disease.  He has had remote history of right ankle fracture but no recent problems.  Does have seizure disorder which is controlled with Keppra.  Followed closely by neurology.  Social history-lives with mom.  Non-smoker.  No illicit drug use.  Past Medical History:  Diagnosis Date   Allergy    rhinitis   Asthma    as a child   Complication of anesthesia    pt. reports he need more anesthesia with the septoplasty surgery   Eosinophilic esophagitis    heartburn and reflux   Family history of adverse reaction to anesthesia    mother respiratory failure   Headache    Nasal fracture    deviated septum   Pneumonia    1x history of   Premature baby    2 months early   Seizures (HCC)    well controlled on meds/ one 2 months ago when meds were late 11/2021   Past Surgical History:  Procedure Laterality Date   ADENOIDECTOMY     CLOSED REDUCTION NASAL FRACTURE N/A 08/31/2017   Procedure: CLOSED REDUCTION NASAL FRACTURE;  Surgeon: Geanie Logan, MD;  Location: [facility];  Service: ENT;  Laterality: N/A;   HARDWARE REMOVAL Right 09/17/2022   Procedure: HARDWARE REMOVAL;  Surgeon: Cammy Copa, MD;  Location: [facility] OR;  Service: Orthopedics;  Laterality: Right;  PROVIDER REQUESTING 2.0 HOURS OPERATING TIME   ORIF HUMERUS FRACTURE Right 05/05/2021   Procedure: reverse Hill-Sachs allografting, biceps tenodesis;   Surgeon: Cammy Copa, MD;  Location: [facility] OR;  Service: Orthopedics;  Laterality: Right;   SEPTOPLASTY N/A 08/31/2017   Procedure: SEPTOPLASTY;  Surgeon: Geanie Logan, MD;  Location: [facility];  Service: ENT;  Laterality: N/A;   SHOULDER ARTHROSCOPY WITH LABRAL REPAIR Left 04/30/2020   Procedure: LEFT SHOULDER POSTERIOR LABRAL REPAIR WITH ARTHROSCOPY, BICEPS TENDON RELEASE AND TENODESIS, OPEN ALLOGRAFT FOR REVERSE BANKART LESION;  Surgeon: Cammy Copa, MD;  Location: [facility] OR;  Service: Orthopedics;  Laterality: Left;   SHOULDER ARTHROSCOPY WITH LABRAL REPAIR Right 05/05/2021   Procedure: right shoulder arthroscopy, biceps release, posterior labral repair;;  Surgeon: Cammy Copa, MD;  Location: [facility] OR;  Service: Orthopedics;  Laterality: Right;   SHOULDER ARTHROSCOPY WITH SUBACROMIAL DECOMPRESSION AND BICEP TENDON REPAIR Right 09/17/2022   Procedure: RIGHT SHOULDER ARTHROSCOPY, SUBSCAPULARIS REPAIR;  Surgeon: Cammy Copa, MD;  Location: [facility] OR;  Service: Orthopedics;  Laterality: Right;   TONSILLECTOMY     and addenoids   UPPER GASTROINTESTINAL ENDOSCOPY  05/15/2022    reports that he has never smoked. He has never used smokeless tobacco. He reports that he does not drink alcohol and does not use drugs. family history includes Cancer in his mother; Clotting disorder in his mother; Protein C deficiency in his sister. Allergies  Allergen Reactions   Lortab [Hydrocodone-Acetaminophen] Hives   Other Anaphylaxis  Peanuts   Zofran [Ondansetron Hcl] Nausea And Vomiting   Citrus Other (See Comments)    Sores in mouth   Dairy Aid [Tilactase] Nausea And Vomiting   Influenza Virus Vaccine Other (See Comments)    Partial paralysis for a couple of weeks per mom   Soy Allergy Other (See Comments)    Seizures     Review of Systems  Constitutional:  Negative for chills, fever, malaise/fatigue and weight loss.  HENT:  Negative for hearing loss.   Eyes:  Negative  for blurred vision and double vision.  Respiratory:  Negative for cough and shortness of breath.   Cardiovascular:  Negative for chest pain, palpitations and leg swelling.  Gastrointestinal:  Negative for abdominal pain, blood in stool, constipation and diarrhea.  Genitourinary:  Negative for dysuria.  Skin:  Negative for rash.  Neurological:  Negative for dizziness, speech change, seizures, loss of consciousness and headaches.  Psychiatric/Behavioral:  Negative for depression.       Objective:     BP 110/60   Pulse 60   Temp 98.4 F (36.9 C) (Oral)   Ht 6\' 1"  (1.854 m)   Wt 127 lb 4.8 oz (57.7 kg)   SpO2 98%   BMI 16.80 kg/m  BP Readings from Last 3 Encounters:  03/03/23 110/60  03/01/23 110/74  02/08/23 120/72   Wt Readings from Last 3 Encounters:  03/03/23 127 lb 4.8 oz (57.7 kg)  03/01/23 127 lb 14.4 oz (58 kg)  02/08/23 125 lb 9.6 oz (57 kg)      Physical Exam Vitals reviewed.  Constitutional:      General: He is not in acute distress.    Appearance: He is well-developed.  HENT:     Head: Normocephalic and atraumatic.     Right Ear: External ear normal.     Left Ear: External ear normal.  Eyes:     Conjunctiva/sclera: Conjunctivae normal.     Pupils: Pupils are equal, round, and reactive to light.  Neck:     Thyroid: No thyromegaly.  Cardiovascular:     Rate and Rhythm: Normal rate and regular rhythm.     Heart sounds: Normal heart sounds. No murmur heard. Pulmonary:     Effort: No respiratory distress.     Breath sounds: No wheezing or rales.  Abdominal:     General: Bowel sounds are normal. There is no distension.     Palpations: Abdomen is soft. There is no mass.     Tenderness: There is no abdominal tenderness. There is no guarding or rebound.  Musculoskeletal:     Cervical back: Normal range of motion and neck supple.  Lymphadenopathy:     Cervical: No cervical adenopathy.  Skin:    Findings: No rash.  Neurological:     Mental Status: He is  alert and oriented to person, place, and time.     Cranial Nerves: No cranial nerve deficit.      No results found for any visits on 03/03/23.  Last CBC Lab Results  Component Value Date   WBC 3.6 (L) 02/10/2023   HGB 13.5 02/10/2023   HCT 40.6 02/10/2023   MCV 85.6 02/10/2023   MCH 28.2 01/21/2023   RDW 13.3 02/10/2023   PLT 159.0 02/10/2023   Last metabolic panel Lab Results  Component Value Date   GLUCOSE 86 02/10/2023   NA 140 02/10/2023   K 3.8 02/10/2023   CL 104 02/10/2023   CO2 27 02/10/2023   BUN 8 02/10/2023  CREATININE 1.10 02/10/2023   GFR 95.22 02/10/2023   CALCIUM 9.7 02/10/2023   PROT 6.8 02/10/2023   ALBUMIN 4.5 02/10/2023   BILITOT 0.7 02/10/2023   ALKPHOS 75 02/10/2023   AST 26 02/10/2023   ALT 17 02/10/2023   ANIONGAP 8 01/21/2023   Last lipids Lab Results  Component Value Date   CHOL 125 03/12/2020   HDL 49 03/12/2020   LDLCALC 63 03/12/2020   TRIG 51 03/12/2020   CHOLHDL 2.6 03/12/2020   Last thyroid functions Lab Results  Component Value Date   TSH 0.62 02/20/2022   Last vitamin D Lab Results  Component Value Date   VD25OH 16.48 (L) 02/10/2023      The ASCVD Risk score (Arnett DK, et al., 2019) failed to calculate for the following reasons:   The 2019 ASCVD risk score is only valid for ages 63 to 54    Assessment & Plan:   Well visit.  No contraindications for sports activity.  He has been cleared per orthopedics regarding his right shoulder from prior surgery from labrum tear.  No history of syncope or chest pain with exercise.  No labs indicated at this time. -Sports forms completed for unrestricted activity   Evelena Peat, MD

## 2023-03-08 ENCOUNTER — Encounter: Payer: Self-pay | Admitting: Family Medicine

## 2023-03-08 DIAGNOSIS — S96912A Strain of unspecified muscle and tendon at ankle and foot level, left foot, initial encounter: Secondary | ICD-10-CM | POA: Diagnosis not present

## 2023-03-08 DIAGNOSIS — M79672 Pain in left foot: Secondary | ICD-10-CM | POA: Diagnosis not present

## 2023-03-22 ENCOUNTER — Telehealth (INDEPENDENT_AMBULATORY_CARE_PROVIDER_SITE_OTHER): Payer: BC Managed Care – PPO | Admitting: Family Medicine

## 2023-03-22 ENCOUNTER — Encounter: Payer: Self-pay | Admitting: Family Medicine

## 2023-03-22 DIAGNOSIS — R519 Headache, unspecified: Secondary | ICD-10-CM | POA: Diagnosis not present

## 2023-03-22 MED ORDER — PREDNISONE 20 MG PO TABS: ORAL_TABLET | ORAL | 0 refills | Status: DC

## 2023-03-22 NOTE — Progress Notes (Signed)
Patient ID: Terrence Riley, male   DOB: 2001/07/01, 22 y.o.   MRN: [id number]   Virtual Visit via Video Note  I connected with Terrence Riley on 03/22/23 at  2:45 PM EDT by a video enabled telemedicine application and verified that I am speaking with the correct person using two identifiers.  Location patient: home Location provider:work or home office Persons participating in the virtual visit: patient, provider  I discussed the limitations of evaluation and management by telemedicine and the availability of in person appointments. The patient expressed understanding and agreed to proceed.   HPI:  Terrence set up virtual to discuss headache for the past 3 to 4 days.  He does have history of migraine headaches but this headache is slightly different.  Usually his headaches last a few hours then resolve.  He took some ibuprofen 400 mg without much improvement.  He has somewhat of a bilateral headache with some photosensitivity and nausea without vomiting.  Does have somewhat of a throbbing quality.  No fever.  No recent reported tick bite.  No significant nasal congestion.  Home COVID test negative.  No recent head injury.  Had difficulty sleeping last night secondary to headache.  Missed school today and needs school note.  Denies any acute or chronic sinusitis symptoms.  He had CT scan on 6/27 which showed no acute findings.  No recent seizure.   ROS: See pertinent positives and negatives per HPI.  Past Medical History:  Diagnosis Date   Allergy    rhinitis   Asthma    as a child   Complication of anesthesia    pt. reports he need more anesthesia with the septoplasty surgery   Eosinophilic esophagitis    heartburn and reflux   Family history of adverse reaction to anesthesia    mother respiratory failure   Headache    Nasal fracture    deviated septum   Pneumonia    1x history of   Premature baby    2 months early   Seizures (HCC)    well controlled on meds/ one 2 months ago  when meds were late 11/2021    Past Surgical History:  Procedure Laterality Date   ADENOIDECTOMY     CLOSED REDUCTION NASAL FRACTURE N/A 08/31/2017   Procedure: CLOSED REDUCTION NASAL FRACTURE;  Surgeon: Geanie Logan, MD;  Location: [facility];  Service: ENT;  Laterality: N/A;   HARDWARE REMOVAL Right 09/17/2022   Procedure: HARDWARE REMOVAL;  Surgeon: Cammy Copa, MD;  Location: [facility] OR;  Service: Orthopedics;  Laterality: Right;  PROVIDER REQUESTING 2.0 HOURS OPERATING TIME   ORIF HUMERUS FRACTURE Right 05/05/2021   Procedure: reverse Hill-Sachs allografting, biceps tenodesis;  Surgeon: Cammy Copa, MD;  Location: [facility] OR;  Service: Orthopedics;  Laterality: Right;   SEPTOPLASTY N/A 08/31/2017   Procedure: SEPTOPLASTY;  Surgeon: Geanie Logan, MD;  Location: [facility];  Service: ENT;  Laterality: N/A;   SHOULDER ARTHROSCOPY WITH LABRAL REPAIR Left 04/30/2020   Procedure: LEFT SHOULDER POSTERIOR LABRAL REPAIR WITH ARTHROSCOPY, BICEPS TENDON RELEASE AND TENODESIS, OPEN ALLOGRAFT FOR REVERSE BANKART LESION;  Surgeon: Cammy Copa, MD;  Location: [facility] OR;  Service: Orthopedics;  Laterality: Left;   SHOULDER ARTHROSCOPY WITH LABRAL REPAIR Right 05/05/2021   Procedure: right shoulder arthroscopy, biceps release, posterior labral repair;;  Surgeon: Cammy Copa, MD;  Location: [facility] OR;  Service: Orthopedics;  Laterality: Right;   SHOULDER ARTHROSCOPY WITH SUBACROMIAL DECOMPRESSION AND BICEP TENDON REPAIR Right 09/17/2022  Procedure: RIGHT SHOULDER ARTHROSCOPY, SUBSCAPULARIS REPAIR;  Surgeon: Cammy Copa, MD;  Location: [facility] OR;  Service: Orthopedics;  Laterality: Right;   TONSILLECTOMY     and addenoids   UPPER GASTROINTESTINAL ENDOSCOPY  05/15/2022    Family History  Problem Relation Age of Onset   Cancer Mother        breat   Clotting disorder Mother    Protein C deficiency Sister    Stomach cancer Neg Hx    Esophageal cancer Neg Hx    Colon  cancer Neg Hx     SOCIAL HX: Attending [employer].  Non-smoker.   Current Outpatient Medications:    celecoxib (CELEBREX) 100 MG capsule, TAKE 1 CAPSULE BY MOUTH TWICE A DAY, Disp: 60 capsule, Rfl: 2   clonazePAM (KLONOPIN) 0.5 MG tablet, Take 1 tablet every night, Disp: 30 tablet, Rfl: 5   cyanocobalamin (,VITAMIN B-12,) 1000 MCG/ML injection, Inject 1 mL (1,000 mcg total) into the muscle every 30 (thirty) days., Disp: 1 mL, Rfl: 0   EPINEPHrine 0.3 mg/0.3 mL IJ SOAJ injection, Inject 0.3 mg into the muscle as needed for anaphylaxis., Disp: 1 each, Rfl: 0   KEPPRA 1000 MG tablet, Take 1 tablet in AM, 2 tablets in PM (Patient taking differently: Take 2 tablet in AM, 2 tablets in PM), Disp: 270 tablet, Rfl: 3   sildenafil (VIAGRA) 100 MG tablet, Take one half to one tablet daily as needed for ED, Disp: 10 tablet, Rfl: 5   tadalafil (CIALIS) 20 MG tablet, Take one tablet every other day as needed for ED, Disp: 10 tablet, Rfl: 2   Vitamin D, Ergocalciferol, (DRISDOL) 1.25 MG (50000 UNIT) CAPS capsule, Take 1 capsule (50,000 Units total) by mouth every 7 (seven) days., Disp: 12 capsule, Rfl: 0  EXAM:  VITALS per patient if applicable:  GENERAL: alert, oriented, appears well and in no acute distress  HEENT: atraumatic, conjunttiva clear, no obvious abnormalities on inspection of external nose and ears  NECK: normal movements of the head and neck  LUNGS: on inspection no signs of respiratory distress, breathing rate appears normal, no obvious gross SOB, gasping or wheezing  CV: no obvious cyanosis  MS: moves all visible extremities without noticeable abnormality  PSYCH/NEURO: pleasant and cooperative, no obvious depression or anxiety, speech and thought processing grossly intact  ASSESSMENT AND PLAN:  Discussed the following assessment and plan:  3 to 4-day history of headache.  Does have history of migraines.  This 1 is more prolonged but has some features of migraine including  intermittent throbbing quality, photosensitivity, nausea.  Recent CT scan as above unremarkable.  -We discussed trying prednisone 20 mg 2 tablets daily for 5 days -Follow-up immediately for any worsening headache and also be in touch if this is not resolving in the next day or so -School note written for today     I discussed the assessment and treatment plan with the patient. The patient was provided an opportunity to ask questions and all were answered. The patient agreed with the plan and demonstrated an understanding of the instructions.   The patient was advised to call back or seek an in-person evaluation if the symptoms worsen or if the condition fails to improve as anticipated.     Evelena Peat, MD

## 2023-03-24 ENCOUNTER — Encounter: Payer: Self-pay | Admitting: Family Medicine

## 2023-03-26 ENCOUNTER — Ambulatory Visit: Payer: BC Managed Care – PPO | Admitting: Family Medicine

## 2023-03-31 ENCOUNTER — Ambulatory Visit: Payer: BC Managed Care – PPO | Admitting: Family Medicine

## 2023-03-31 ENCOUNTER — Encounter: Payer: Self-pay | Admitting: Family Medicine

## 2023-03-31 VITALS — BP 110/70 | HR 58 | Temp 98.2°F | Ht 73.0 in | Wt 129.3 lb

## 2023-03-31 DIAGNOSIS — G44221 Chronic tension-type headache, intractable: Secondary | ICD-10-CM | POA: Diagnosis not present

## 2023-03-31 MED ORDER — NORTRIPTYLINE HCL 10 MG PO CAPS: 10.0000 mg | ORAL_CAPSULE | Freq: Every day | ORAL | 1 refills | Status: DC

## 2023-03-31 MED ORDER — NAPROXEN 500 MG PO TABS: 500.0000 mg | ORAL_TABLET | Freq: Two times a day (BID) | ORAL | 0 refills | Status: AC

## 2023-03-31 NOTE — Progress Notes (Signed)
Established Patient Office Visit  Subjective   Patient ID: Terrence Riley, male    DOB: 04-05-2001  Age: 22 y.o. MRN: [id number]  Chief Complaint  Patient presents with   Headache    Patient complains of headaches, x2 weeks     HPI   Seen today with some persistent bifrontal headaches.  He was recently treated with brief course of prednisone which did not help at all.  He has tried over-the-counter medications such as Motrin without relief.  Interestingly, his headaches are actually improved when he exercises.  No nausea or vomiting.  Has some increased muscle tension.  Denies recent seizure activity.  No focal weakness.   Refer to recent virtual note from 8-26/24 for details [name] set up virtual to discuss headache for the past 3 to 4 days.  He does have history of migraine headaches but this headache is slightly different.  Usually his headaches last a few hours then resolve.  He took some ibuprofen 400 mg without much improvement.  He has somewhat of a bilateral headache with some photosensitivity and nausea without vomiting.  Does have somewhat of a throbbing quality.  No fever.  No recent reported tick bite.  No significant nasal congestion.  Home COVID test negative.  No recent head injury.  Had difficulty sleeping last night secondary to headache.  Missed school today and needs school note.   Denies any acute or chronic sinusitis symptoms.  He had CT scan on 6/27 which showed no acute findings.  No recent seizure.  Denies fever.  No recent head injury.  No recent seizure activity.  Past Medical History:  Diagnosis Date   Allergy    rhinitis   Asthma    as a child   Complication of anesthesia    pt. reports he need more anesthesia with the septoplasty surgery   Eosinophilic esophagitis    heartburn and reflux   Family history of adverse reaction to anesthesia    mother respiratory failure   Headache    Nasal fracture    deviated septum   Pneumonia    1x history of    Premature baby    2 months early   Seizures (HCC)    well controlled on meds/ one 2 months ago when meds were late 11/2021   Past Surgical History:  Procedure Laterality Date   ADENOIDECTOMY     CLOSED REDUCTION NASAL FRACTURE N/A 08/31/2017   Procedure: CLOSED REDUCTION NASAL FRACTURE;  Surgeon: Geanie Logan, MD;  Location: [facility];  Service: ENT;  Laterality: N/A;   HARDWARE REMOVAL Right 09/17/2022   Procedure: HARDWARE REMOVAL;  Surgeon: Cammy Copa, MD;  Location: [facility] OR;  Service: Orthopedics;  Laterality: Right;  PROVIDER REQUESTING 2.0 HOURS OPERATING TIME   ORIF HUMERUS FRACTURE Right 05/05/2021   Procedure: reverse Hill-Sachs allografting, biceps tenodesis;  Surgeon: Cammy Copa, MD;  Location: [facility] OR;  Service: Orthopedics;  Laterality: Right;   SEPTOPLASTY N/A 08/31/2017   Procedure: SEPTOPLASTY;  Surgeon: Geanie Logan, MD;  Location: [facility];  Service: ENT;  Laterality: N/A;   SHOULDER ARTHROSCOPY WITH LABRAL REPAIR Left 04/30/2020   Procedure: LEFT SHOULDER POSTERIOR LABRAL REPAIR WITH ARTHROSCOPY, BICEPS TENDON RELEASE AND TENODESIS, OPEN ALLOGRAFT FOR REVERSE BANKART LESION;  Surgeon: Cammy Copa, MD;  Location: [facility] OR;  Service: Orthopedics;  Laterality: Left;   SHOULDER ARTHROSCOPY WITH LABRAL REPAIR Right 05/05/2021   Procedure: right shoulder arthroscopy, biceps release, posterior labral repair;;  Surgeon: August Saucer,  Corrie Mckusick, MD;  Location: [facility] OR;  Service: Orthopedics;  Laterality: Right;   SHOULDER ARTHROSCOPY WITH SUBACROMIAL DECOMPRESSION AND BICEP TENDON REPAIR Right 09/17/2022   Procedure: RIGHT SHOULDER ARTHROSCOPY, SUBSCAPULARIS REPAIR;  Surgeon: Cammy Copa, MD;  Location: [facility] OR;  Service: Orthopedics;  Laterality: Right;   TONSILLECTOMY     and addenoids   UPPER GASTROINTESTINAL ENDOSCOPY  05/15/2022    reports that he has never smoked. He has never used smokeless tobacco. He reports that he does not drink  alcohol and does not use drugs. family history includes Cancer in his mother; Clotting disorder in his mother; Protein C deficiency in his sister. Allergies  Allergen Reactions   Lortab [Hydrocodone-Acetaminophen] Hives   Other Anaphylaxis    Peanuts   Zofran [Ondansetron Hcl] Nausea And Vomiting   Citrus Other (See Comments)    Sores in mouth   Dairy Aid [Tilactase] Nausea And Vomiting   Influenza Virus Vaccine Other (See Comments)    Partial paralysis for a couple of weeks per mom   Soy Allergy Other (See Comments)    Seizures     Review of Systems  Constitutional:  Negative for chills and fever.  HENT:  Negative for congestion and sinus pain.   Respiratory:  Negative for cough and sputum production.   Neurological:  Positive for headaches. Negative for tingling, tremors, focal weakness, seizures and loss of consciousness.      Objective:     BP 110/70 (BP Location: Left Arm, Patient Position: Sitting, Cuff Size: Normal)   Pulse (!) 58   Temp 98.2 F (36.8 C) (Oral)   Ht 6\' 1"  (1.854 m)   Wt 129 lb 4.8 oz (58.7 kg)   SpO2 98%   BMI 17.06 kg/m  BP Readings from Last 3 Encounters:  03/31/23 110/70  03/03/23 110/60  03/01/23 110/74   Wt Readings from Last 3 Encounters:  03/31/23 129 lb 4.8 oz (58.7 kg)  03/03/23 127 lb 4.8 oz (57.7 kg)  03/01/23 127 lb 14.4 oz (58 kg)      Physical Exam Vitals reviewed.  Constitutional:      General: He is not in acute distress.    Appearance: He is well-developed. He is not ill-appearing.  Eyes:     Extraocular Movements: Extraocular movements intact.     Pupils: Pupils are equal, round, and reactive to light.  Cardiovascular:     Rate and Rhythm: Normal rate and regular rhythm.  Pulmonary:     Effort: Pulmonary effort is normal.     Breath sounds: Normal breath sounds.  Musculoskeletal:     Cervical back: Neck supple.  Lymphadenopathy:     Cervical: No cervical adenopathy.  Neurological:     Mental Status: He is  alert.     Cranial Nerves: No cranial nerve deficit.     Motor: No weakness.  Psychiatric:        Mood and Affect: Mood normal.        Behavior: Behavior normal.      No results found for any visits on 03/31/23.    The ASCVD Risk score (Arnett DK, et al., 2019) failed to calculate for the following reasons:   The 2019 ASCVD risk score is only valid for ages 70 to 4    Assessment & Plan:   Several day history of persistent bifrontal headache.  He has some associated neck stiffness and soreness.  No fever.  Nonfocal neuroexam.  Sounds like chronic tension type headache predominantly.  Recent CT scan unremarkable.  -He has leftover Robaxin from prior back surgery and will trial Robaxin 500 mg nightly -We wrote for limited naproxen 500 mg every 12 hours as needed but not to take regularly but only acutely for severe headache and take with food -Start nortriptyline 10 mg p.o. nightly.  He states he has taken this previously without adverse issues -Be in touch if headache not significantly improved within the next couple weeks -Consult with his neurologist if not improving with the above  Evelena Peat, MD

## 2023-03-31 NOTE — Patient Instructions (Signed)
Try the Naproxen one twice daily with food  Try the Methocarbamol at night (muscle relaxer)  Start the Nortriptyline 10 mg at night to try to prevent headaches.   Let me know if no relief in 1-2 weeks.

## 2023-04-01 NOTE — Telephone Encounter (Signed)
I did send school excuse through My Chart.  Kristian Covey MD [facility]

## 2023-04-02 ENCOUNTER — Encounter: Payer: BC Managed Care – PPO | Admitting: Family Medicine

## 2023-04-07 ENCOUNTER — Ambulatory Visit: Payer: BC Managed Care – PPO | Admitting: Family Medicine

## 2023-04-12 ENCOUNTER — Encounter: Payer: Self-pay | Admitting: Family Medicine

## 2023-04-12 ENCOUNTER — Ambulatory Visit (INDEPENDENT_AMBULATORY_CARE_PROVIDER_SITE_OTHER): Payer: BC Managed Care – PPO | Admitting: Family Medicine

## 2023-04-12 VITALS — BP 100/60 | HR 65 | Temp 98.9°F | Ht 73.0 in | Wt 127.7 lb

## 2023-04-12 DIAGNOSIS — G4452 New daily persistent headache (NDPH): Secondary | ICD-10-CM

## 2023-04-12 NOTE — Progress Notes (Signed)
Established Patient Office Visit  Subjective   Patient ID: Terrence Riley, male    DOB: February 12, 2001  Age: 22 y.o. MRN: [id number]  Chief Complaint  Patient presents with   Headache    Patient complains of worsening of headaches since the last visit, states last night he had a severe headache at 2am, "felt heat flash and felt lightheaded"    HPI   Terrence Riley is seen for follow up regarding persistent daily bifrontal headache.  He had previously tried OTC Motrin without relief.  Naproxen helped briefly.  We suspected chronic tension type headache, although he does have history of reported migraines.  He had previously also been on course of prednisone without relief.  We started low-dose nortriptyline 10 mg nightly and has not seen much improvement.  Still occasionally wakes up with headache.  Headaches are essentially daily.  Light seems to worsen.  Has some muscle stiffness in his upper back.  Tried Robaxin recently without improvement.  Denies any recent head injury.  No recent seizure.  Takes Keppra for seizure disorder.  Sleep is somewhat erratic at times.  He states he had good appetite and staying well-hydrated.  CT scan head back in June unremarkable  Past Medical History:  Diagnosis Date   Allergy    rhinitis   Asthma    as a child   Complication of anesthesia    pt. reports he need more anesthesia with the septoplasty surgery   Eosinophilic esophagitis    heartburn and reflux   Family history of adverse reaction to anesthesia    mother respiratory failure   Headache    Nasal fracture    deviated septum   Pneumonia    1x history of   Premature baby    2 months early   Seizures (HCC)    well controlled on meds/ one 2 months ago when meds were late 11/2021   Past Surgical History:  Procedure Laterality Date   ADENOIDECTOMY     CLOSED REDUCTION NASAL FRACTURE N/A 08/31/2017   Procedure: CLOSED REDUCTION NASAL FRACTURE;  Surgeon: Geanie Logan, MD;  Location: [facility];  Service: ENT;  Laterality: N/A;   HARDWARE REMOVAL Right 09/17/2022   Procedure: HARDWARE REMOVAL;  Surgeon: Cammy Copa, MD;  Location: [facility] OR;  Service: Orthopedics;  Laterality: Right;  PROVIDER REQUESTING 2.0 HOURS OPERATING TIME   ORIF HUMERUS FRACTURE Right 05/05/2021   Procedure: reverse Hill-Sachs allografting, biceps tenodesis;  Surgeon: Cammy Copa, MD;  Location: [facility] OR;  Service: Orthopedics;  Laterality: Right;   SEPTOPLASTY N/A 08/31/2017   Procedure: SEPTOPLASTY;  Surgeon: Geanie Logan, MD;  Location: [facility];  Service: ENT;  Laterality: N/A;   SHOULDER ARTHROSCOPY WITH LABRAL REPAIR Left 04/30/2020   Procedure: LEFT SHOULDER POSTERIOR LABRAL REPAIR WITH ARTHROSCOPY, BICEPS TENDON RELEASE AND TENODESIS, OPEN ALLOGRAFT FOR REVERSE BANKART LESION;  Surgeon: Cammy Copa, MD;  Location: [facility] OR;  Service: Orthopedics;  Laterality: Left;   SHOULDER ARTHROSCOPY WITH LABRAL REPAIR Right 05/05/2021   Procedure: right shoulder arthroscopy, biceps release, posterior labral repair;;  Surgeon: Cammy Copa, MD;  Location: [facility] OR;  Service: Orthopedics;  Laterality: Right;   SHOULDER ARTHROSCOPY WITH SUBACROMIAL DECOMPRESSION AND BICEP TENDON REPAIR Right 09/17/2022   Procedure: RIGHT SHOULDER ARTHROSCOPY, SUBSCAPULARIS REPAIR;  Surgeon: Cammy Copa, MD;  Location: [facility] OR;  Service: Orthopedics;  Laterality: Right;   TONSILLECTOMY     and addenoids   UPPER GASTROINTESTINAL ENDOSCOPY  05/15/2022  reports that he has never smoked. He has never used smokeless tobacco. He reports that he does not drink alcohol and does not use drugs. family history includes Cancer in his mother; Clotting disorder in his mother; Protein C deficiency in his sister. Allergies  Allergen Reactions   Lortab [Hydrocodone-Acetaminophen] Hives   Other Anaphylaxis    Peanuts   Zofran [Ondansetron Hcl] Nausea And Vomiting   Citrus Other (See Comments)    Sores  in mouth   Dairy Aid [Tilactase] Nausea And Vomiting   Influenza Virus Vaccine Other (See Comments)    Partial paralysis for a couple of weeks per mom   Soy Allergy Other (See Comments)    Seizures     Review of Systems  Constitutional:  Negative for chills and fever.  Neurological:  Positive for headaches. Negative for speech change, focal weakness, seizures and loss of consciousness.  Psychiatric/Behavioral:  Negative for depression.       Objective:     BP 100/60 (BP Location: Left Arm, Patient Position: Sitting, Cuff Size: Normal)   Pulse 65   Temp 98.9 F (37.2 C) (Oral)   Ht 6\' 1"  (1.854 m)   Wt 127 lb 11.2 oz (57.9 kg)   SpO2 100%   BMI 16.85 kg/m  BP Readings from Last 3 Encounters:  04/12/23 100/60  03/31/23 110/70  03/03/23 110/60   Wt Readings from Last 3 Encounters:  04/12/23 127 lb 11.2 oz (57.9 kg)  03/31/23 129 lb 4.8 oz (58.7 kg)  03/03/23 127 lb 4.8 oz (57.7 kg)      Physical Exam Vitals reviewed.  Constitutional:      General: He is not in acute distress.    Appearance: He is well-developed. He is not ill-appearing.  Cardiovascular:     Rate and Rhythm: Normal rate and regular rhythm.  Pulmonary:     Effort: Pulmonary effort is normal.     Breath sounds: Normal breath sounds.  Musculoskeletal:     Cervical back: Neck supple.  Neurological:     Mental Status: He is alert and oriented to person, place, and time.     Cranial Nerves: No cranial nerve deficit or facial asymmetry.     Motor: No weakness.  Psychiatric:        Mood and Affect: Mood normal.      No results found for any visits on 04/12/23.    The ASCVD Risk score (Arnett DK, et al., 2019) failed to calculate for the following reasons:   The 2019 ASCVD risk score is only valid for ages 80 to 70    Assessment & Plan:   Problem List Items Addressed This Visit   None Visit Diagnoses     NDPH (new daily persistent headache)    -  Primary   Relevant Orders   Ambulatory  referral to Neurology     Suspected tension type headache.  Not improved thus far with nortriptyline 10 mg nightly.  Titrate up to 20 mg nightly.  Set up neurology referral.  Avoid daily use of analgesics  No follow-ups on file.    Evelena Peat, MD

## 2023-04-12 NOTE — Patient Instructions (Signed)
Go ahead and increase the Nortriptyline to TWO tablets at night (ie 20 mg )  I am setting up neurology referral.

## 2023-04-13 ENCOUNTER — Other Ambulatory Visit: Payer: Self-pay | Admitting: Neurology

## 2023-04-24 ENCOUNTER — Other Ambulatory Visit: Payer: Self-pay | Admitting: Family Medicine

## 2023-04-26 ENCOUNTER — Emergency Department (HOSPITAL_COMMUNITY): Payer: BC Managed Care – PPO

## 2023-04-26 ENCOUNTER — Emergency Department (HOSPITAL_COMMUNITY)
Admission: EM | Admit: 2023-04-26 | Discharge: 2023-04-27 | Disposition: A | Discharge status: Home / Self Care | Payer: BC Managed Care – PPO | Attending: Emergency Medicine | Admitting: Emergency Medicine

## 2023-04-26 ENCOUNTER — Other Ambulatory Visit: Payer: Self-pay

## 2023-04-26 ENCOUNTER — Encounter (HOSPITAL_COMMUNITY): Payer: Self-pay

## 2023-04-26 DIAGNOSIS — Z1152 Encounter for screening for COVID-19: Secondary | ICD-10-CM | POA: Insufficient documentation

## 2023-04-26 DIAGNOSIS — R0789 Other chest pain: Secondary | ICD-10-CM | POA: Insufficient documentation

## 2023-04-26 DIAGNOSIS — R569 Unspecified convulsions: Secondary | ICD-10-CM | POA: Diagnosis not present

## 2023-04-26 DIAGNOSIS — R079 Chest pain, unspecified: Secondary | ICD-10-CM | POA: Diagnosis not present

## 2023-04-26 LAB — CBC
HCT: 41 % (ref 39.0–52.0)
Hemoglobin: 14 g/dL (ref 13.0–17.0)
MCH: 28.7 pg (ref 26.0–34.0)
MCHC: 34.1 g/dL (ref 30.0–36.0)
MCV: 84.2 fL (ref 80.0–100.0)
Platelets: 161 10*3/uL (ref 150–400)
RBC: 4.87 MIL/uL (ref 4.22–5.81)
RDW: 11.5 % (ref 11.5–15.5)
WBC: 3.9 10*3/uL — ABNORMAL LOW (ref 4.0–10.5)
nRBC: 0 % (ref 0.0–0.2)

## 2023-04-26 LAB — BASIC METABOLIC PANEL
Anion gap: 11 (ref 5–15)
BUN: <5 mg/dL — ABNORMAL LOW (ref 6–20)
CO2: 27 mmol/L (ref 22–32)
Calcium: 9.1 mg/dL (ref 8.9–10.3)
Chloride: 102 mmol/L (ref 98–111)
Creatinine, Ser: 1.07 mg/dL (ref 0.61–1.24)
GFR, Estimated: >60 mL/min (ref >60)
Glucose, Bld: 93 mg/dL (ref 70–99)
Potassium: 3.9 mmol/L (ref 3.5–5.1)
Sodium: 140 mmol/L (ref 135–145)

## 2023-04-26 LAB — SARS CORONAVIRUS 2 BY RT PCR: SARS Coronavirus 2 by RT PCR: NEGATIVE

## 2023-04-26 MED ORDER — IBUPROFEN 400 MG PO TABS
600.0000 mg | ORAL_TABLET | Freq: Once | ORAL | Status: AC
  Administered 2023-04-26: 600 mg via ORAL
  Filled 2023-04-26: qty 1

## 2023-04-26 NOTE — ED Triage Notes (Addendum)
Pt coming in via POV with c/o left sided chest pain that started earlier this morning around 0800. Pt endorses shortness of breath and dizziness. He states he has an ongoing headache that began a month ago, states has hx of migraines. Pt states he came in to be seen because the chest pain has not gone away since this morning. Denies nausea and vomiting. Pt has regular, unlabored breathing. Alert and oriented x4.

## 2023-04-26 NOTE — ED Provider Triage Note (Signed)
Emergency Medicine Provider Triage Evaluation Note  Terrence Riley , a 22 y.o. male  was evaluated in triage.  Pt complains of chest pain.  Patient reports chest pain began around 8 AM this morning.  Reports constant symptoms since onset.  Reports some associated shortness of breath.  Denies radiation of pain.  Also reports headache that has had for the past month and remains unchanged.  Has taken no medication for his symptoms..  Review of Systems  Positive: See above Negative:   Physical Exam  BP 132/71 (BP Location: Right Arm)   Pulse 78   Temp 97.8 F (36.6 C)   Resp 18   Ht 6\' 1"  (1.854 m)   Wt 59.9 kg   SpO2 99%   BMI 17.42 kg/m  Gen:   Awake, no distress   Resp:  Normal effort  MSK:   Moves extremities without difficulty  Other:  Chest wall tenderness.  Medical Decision Making  Medically screening exam initiated at 9:39 PM.  Appropriate orders placed.  Terrence Riley was informed that the remainder of the evaluation will be completed by another provider, this initial triage assessment does not replace that evaluation, and the importance of remaining in the ED until their evaluation is complete.     Peter Garter, [place] 04/26/23 2139

## 2023-04-27 LAB — D-DIMER, QUANTITATIVE: D-Dimer, Quant: <0.27 ug{FEU}/mL (ref 0.00–0.50)

## 2023-04-27 LAB — TROPONIN I (HIGH SENSITIVITY)
Troponin I (High Sensitivity): <2 ng/L (ref <18)
Troponin I (High Sensitivity): <2 ng/L (ref <18)

## 2023-04-27 MED ORDER — LORAZEPAM 2 MG/ML IJ SOLN
2.0000 mg | Freq: Once | INTRAMUSCULAR | Status: AC
  Administered 2023-04-27: 2 mg via INTRAVENOUS

## 2023-04-27 MED ORDER — LACTATED RINGERS IV BOLUS
1000.0000 mL | Freq: Once | INTRAVENOUS | Status: AC
  Administered 2023-04-27: 1000 mL via INTRAVENOUS

## 2023-04-27 MED ORDER — LEVETIRACETAM IN NACL 1500 MG/100ML IV SOLN
1500.0000 mg | Freq: Once | INTRAVENOUS | Status: AC
  Administered 2023-04-27: 1500 mg via INTRAVENOUS
  Filled 2023-04-27: qty 100

## 2023-04-27 MED ORDER — LORAZEPAM 2 MG/ML IJ SOLN
2.0000 mg | Freq: Once | INTRAMUSCULAR | Status: AC
  Administered 2023-04-27: 2 mg via INTRAMUSCULAR
  Filled 2023-04-27: qty 1

## 2023-04-27 NOTE — Discharge Instructions (Signed)
It was a pleasure taking care of you today.  As discussed, all your heart labs were normal.  Chest x-ray was normal.  You had a seizure while you were in the ER.  Do not drive until evaluated by your neurologist (should not drive for 6 months after last seizure).  Continue taking your Keppra as previously prescribed.  Return to the ER for any worsening symptoms.

## 2023-04-27 NOTE — ED Provider Notes (Signed)
[facility]-EMERGENCY DEPT [facility] Emergency Department Provider Note MRN:  [id number]  Arrival date & time: 04/27/23     Chief Complaint   Chest Pain   History of Present Illness   Terrence Riley is a 22 y.o. year-old male presents to the ED with chief complaint of chest pain.  States that he has been having intermittent, sharp, stabbing chest pains since yesterday morning.  He denies fever, chills, or cough, but reports that his mom said she thought he felt hot.    On initially walking in to the room, the patient was having seizure like activity and was unable to provide any history.  The history above was obtained after the patient had stopped seizing.  History provided by patient.   Review of Systems  Pertinent positive and negative review of systems noted in HPI.    Physical Exam   Vitals:   04/27/23 1000 04/27/23 1043  BP: 107/73   Pulse: (!) 56   Resp: 14   Temp:  97.9 F (36.6 C)  SpO2: 100%     CONSTITUTIONAL:  Seizure like active, subtle tonic clonic jerking, unresponsive NEURO:  Alert and oriented x 3, CN 3-12 grossly intact EYES:  eyes equal and reactive ENT/NECK:  Supple, no stridor  CARDIO:  normal rate, regular rhythm, appears well-perfused  PULM:  No respiratory distress, CTAB GI/GU:  non-distended,  MSK/SPINE:  No gross deformities, no edema, moves all extremities  SKIN:  no rash, atraumatic   *Additional and/or pertinent findings included in MDM below  Diagnostic and Interventional Summary    EKG Interpretation Date/Time:  Tuesday April 27 2023 10:13:32 EDT Ventricular Rate:  82 PR Interval:  141 QRS Duration:  84 QT Interval:  373 QTC Calculation: 436 R Axis:   50  Text Interpretation: Sinus rhythm ST elev, probable normal early repol pattern when compared to prior, similar artifact. NO STEMI Confirmed by Theda Belfast ([id number]) on 04/27/2023 10:35:14 AM       Labs Reviewed  BASIC METABOLIC PANEL - Abnormal; Notable for the  following components:      Result Value   BUN <5 (*)    All other components within normal limits  CBC - Abnormal; Notable for the following components:   WBC 3.9 (*)    All other components within normal limits  SARS CORONAVIRUS 2 BY RT PCR  D-DIMER, QUANTITATIVE  TROPONIN I (HIGH SENSITIVITY)  TROPONIN I (HIGH SENSITIVITY)    DG Chest 2 View  Final Result      Medications  ibuprofen (ADVIL) tablet 600 mg (600 mg Oral Given 04/26/23 2144)  LORazepam (ATIVAN) injection 2 mg (2 mg Intramuscular Given 04/27/23 0452)  levETIRAcetam (KEPPRA) IVPB 1500 mg/ 100 mL premix (0 mg Intravenous Stopped 04/27/23 0542)  LORazepam (ATIVAN) injection 2 mg (2 mg Intravenous Given 04/27/23 0502)  lactated ringers bolus 1,000 mL (0 mLs Intravenous Stopped 04/27/23 0804)     Procedures  /  Critical Care Procedures  ED Course and Medical Decision Making  I have reviewed the triage vital signs, the nursing notes, and pertinent available records from the EMR.  Social Determinants Affecting Complexity of Care: Patient has no clinically significant social determinants affecting this chief complaint..   ED Course: Clinical Course as of 04/27/23 2333  Tue Apr 27, 2023  1914 Patient tells me he can only take brand name keppra.  I discussed this with pharmacy, who states that continuing with IV dose is fine, but patient will get PO brand name  keppra for home. [RB]  [id number] D-Dimer, Quant: <0.27 [CA]  0729 Troponin I (High Sensitivity): <2 [CA]    Clinical Course User Index [CA] Mannie Stabile, PA-C [RB] Roxy Horseman, PA-C    Medical Decision Making When I walked into the room the patient was exhibiting seizure like activity with subtle tonic clonic jerks. He wasn't responsive.    He was given 2mg  IM ativan and the 2mg  IV ativan.  After the IV  Amount and/or Complexity of Data Reviewed Labs: ordered. Decision-making details documented in ED Course.  Risk Prescription drug management.          Consultants: No consultations were needed in caring for this patient.   Treatment and Plan: Signed out to oncoming team at shift change.  Plan: Reassess Follow-up on labs Anticipate discharge    Final Clinical Impressions(s) / ED Diagnoses     ICD-10-CM   1. Atypical chest pain  R07.89     2. Seizure [facility])  R56.9       ED Discharge Orders     None         Discharge Instructions Discussed with and Provided to Patient:     Discharge Instructions      It was a pleasure taking care of you today.  As discussed, all your heart labs were normal.  Chest x-ray was normal.  You had a seizure while you were in the ER.  Do not drive until evaluated by your neurologist (should not drive for 6 months after last seizure).  Continue taking your Keppra as previously prescribed.  Return to the ER for any worsening symptoms.       Roxy Horseman, PA-C 04/27/23 2333    Shon Baton, MD 04/29/23 [phone]

## 2023-04-27 NOTE — ED Notes (Signed)
PA to bedside to perform assessment.  Patient noted by PA to be having seizure activity.  Pt with hx of seizures

## 2023-04-27 NOTE — ED Provider Notes (Signed)
Care assumed from Terrence Horseman, PA-C at shift change pending d-dimer and troponin. See his note for full HPI.  In short, patient is a 22 year old male who presents to the ED due to chest pain.  Pain started prior to arrival associate with shortness of breath.  Upon previous provider's initial evaluation patient was having a seizure.  History of seizure disorder.  Seizure occurred around 4 to 5 AM and lasted roughly 11 minutes.   Physical Exam  BP (!) 154/99   Pulse 82   Temp 98.5 F (36.9 C)   Resp (!) 24   Ht 6\' 1"  (1.854 m)   Wt 59.9 kg   SpO2 100%   BMI 17.42 kg/m   Physical Exam Vitals and nursing note reviewed.  Constitutional:      General: He is not in acute distress.    Appearance: He is not ill-appearing.  HENT:     Head: Normocephalic.  Eyes:     Pupils: Pupils are equal, round, and reactive to light.  Cardiovascular:     Rate and Rhythm: Normal rate and regular rhythm.     Pulses: Normal pulses.     Heart sounds: Normal heart sounds. No murmur heard.    No friction rub. No gallop.  Pulmonary:     Effort: Pulmonary effort is normal.     Breath sounds: Normal breath sounds.  Abdominal:     General: Abdomen is flat. There is no distension.     Palpations: Abdomen is soft.     Tenderness: There is no abdominal tenderness. There is no guarding or rebound.  Musculoskeletal:        General: Normal range of motion.     Cervical back: Neck supple.  Skin:    General: Skin is warm and dry.  Neurological:     General: No focal deficit present.     Mental Status: He is alert.  Psychiatric:        Mood and Affect: Mood normal.        Behavior: Behavior normal.     Procedures  Procedures  ED Course / MDM   Clinical Course as of 04/27/23 0928  Tue Apr 27, 2023  1324 Patient tells me he can only take brand name keppra.  I discussed this with pharmacy, who states that continuing with IV dose is fine, but patient will get PO brand name keppra for home. [RB]   [id number] D-Dimer, Quant: <0.27 [CA]  0729 Troponin I (High Sensitivity): <2 [CA]    Clinical Course User Index [CA] Mannie Stabile, PA-C [RB] Terrence Horseman, PA-C   Medical Decision Making Amount and/or Complexity of Data Reviewed Labs: ordered. Decision-making details documented in ED Course.  Risk Prescription drug management.   Care assumed from Terrence Horseman, PA-C at shift change pending d-dimer and troponin. See his note for full HPI.  6:47 AM reassessed patient at bedside.  Patient asleep in bed however, easily arousable.  Patient able to answer questions and follow commands.  Notes he developed left-sided chest pain just prior to arrival.  Patient has been here 9 hours and 45 minutes.  Chest pain left-sided described as a pressure-like sensation.  Chest pain associated with shortness of breath.  Does not drink any alcohol.  No abdominal pain.  10:08 AM reassessed patient at bedside.  Patient resting comfortably bed.  Patient has been observed for over 5 hours since his seizure.  Patient no longer postictal.  Patient back to baseline.  Patient able to  ambulate and tolerate po. Patient loaded with Keppra by previous provider.  Advised patient to call his neurologist to schedule an appointment for further evaluation.  Continue taking Keppra as prescribed.  D-dimer normal.  Low suspicion for PE.  Troponin x 2 normal. EKG NSR, no signs of acute ischemia. Low suspicion for ACS.  Unclear etiology of chest pain however, chest pain appears atypical in nature.  Patient stable for discharge.  Advised patient follow-up with PCP for further evaluation. Strict ED precautions discussed with patient. Patient states understanding and agrees to plan. Patient discharged home in no acute distress and stable vitals  Lives at home Hx seizures Has PCP      Mannie Stabile, PA-C 04/27/23 1037    Tegeler, Canary Brim, MD 04/27/23 1137

## 2023-04-29 ENCOUNTER — Other Ambulatory Visit: Payer: Self-pay

## 2023-04-29 ENCOUNTER — Emergency Department (HOSPITAL_COMMUNITY)
Admission: EM | Admit: 2023-04-29 | Discharge: 2023-04-29 | Disposition: A | Discharge status: Home / Self Care | Payer: BC Managed Care – PPO | Attending: Emergency Medicine | Admitting: Emergency Medicine

## 2023-04-29 ENCOUNTER — Encounter (HOSPITAL_COMMUNITY): Payer: Self-pay

## 2023-04-29 ENCOUNTER — Emergency Department (HOSPITAL_COMMUNITY): Payer: BC Managed Care – PPO

## 2023-04-29 DIAGNOSIS — Z5321 Procedure and treatment not carried out due to patient leaving prior to being seen by health care provider: Secondary | ICD-10-CM | POA: Insufficient documentation

## 2023-04-29 DIAGNOSIS — R079 Chest pain, unspecified: Secondary | ICD-10-CM | POA: Insufficient documentation

## 2023-04-29 DIAGNOSIS — R0602 Shortness of breath: Secondary | ICD-10-CM | POA: Diagnosis not present

## 2023-04-29 LAB — CBC
HCT: 43.9 % (ref 39.0–52.0)
Hemoglobin: 14.6 g/dL (ref 13.0–17.0)
MCH: 29.1 pg (ref 26.0–34.0)
MCHC: 33.3 g/dL (ref 30.0–36.0)
MCV: 87.5 fL (ref 80.0–100.0)
Platelets: 164 10*3/uL (ref 150–400)
RBC: 5.02 MIL/uL (ref 4.22–5.81)
RDW: 11.6 % (ref 11.5–15.5)
WBC: 3.9 10*3/uL — ABNORMAL LOW (ref 4.0–10.5)
nRBC: 0 % (ref 0.0–0.2)

## 2023-04-29 LAB — BASIC METABOLIC PANEL
Anion gap: 13 (ref 5–15)
BUN: 8 mg/dL (ref 6–20)
CO2: 22 mmol/L (ref 22–32)
Calcium: 9.2 mg/dL (ref 8.9–10.3)
Chloride: 103 mmol/L (ref 98–111)
Creatinine, Ser: 1.01 mg/dL (ref 0.61–1.24)
GFR, Estimated: >60 mL/min (ref >60)
Glucose, Bld: 80 mg/dL (ref 70–99)
Potassium: 3.5 mmol/L (ref 3.5–5.1)
Sodium: 138 mmol/L (ref 135–145)

## 2023-04-29 LAB — TROPONIN I (HIGH SENSITIVITY): Troponin I (High Sensitivity): 4 ng/L (ref <18)

## 2023-04-29 NOTE — ED Triage Notes (Signed)
Pt complaining of chest pain, SOB, neck stiffness and headache. Pt seen at [facility] recently for similar chest pain. Chest pain and SOB have been ongoing for apprx 2 days, and neck stiffness/headache for apprx a month. Denies any N/V/D. Pt does endorse a non-productive cough.

## 2023-04-29 NOTE — ED Provider Triage Note (Cosign Needed)
Emergency Medicine Provider Triage Evaluation Note  Terrence Riley , a 22 y.o. male  was evaluated in triage.  Pt complains of persistent left sided chest pain for the last 2 days. Worse with movement and deep inspiration. Denies any alleviating factors.  Review of Systems  Positive: Chest pain, shortness of breath Negative: Abdominal pain, fever  Physical Exam  BP 127/75   Pulse 73   Temp 98 F (36.7 C) (Oral)   Resp 12   SpO2 100%  Gen:   Awake, no distress   Resp:  Normal effort  MSK:   Moves extremities without difficulty  Other:    Medical Decision Making  Medically screening exam initiated at 3:41 PM.  Appropriate orders placed.  [name] DRAGON THRUSH was informed that the remainder of the evaluation will be completed by another provider, this initial triage assessment does not replace that evaluation, and the importance of remaining in the ED until their evaluation is complete.    Maxwell Marion, PA-C 04/29/23 1542

## 2023-05-05 ENCOUNTER — Encounter: Payer: Self-pay | Admitting: Family Medicine

## 2023-05-05 ENCOUNTER — Ambulatory Visit (INDEPENDENT_AMBULATORY_CARE_PROVIDER_SITE_OTHER): Payer: BC Managed Care – PPO | Admitting: Family Medicine

## 2023-05-05 VITALS — BP 102/88 | HR 85 | Temp 97.9°F | Ht 73.0 in | Wt 124.8 lb

## 2023-05-05 DIAGNOSIS — R0789 Other chest pain: Secondary | ICD-10-CM

## 2023-05-05 NOTE — Progress Notes (Unsigned)
Established Patient Office Visit  Subjective   Patient ID: Terrence Riley, male    DOB: March 19, 2001  Age: 22 y.o. MRN: [id number]  Chief Complaint  Patient presents with   Follow-up    Pt is here with grandmother to follow up from ED for chest pain and shortness of breath. Grandmother reports pt was having excessive sweating, headache yesterday. Pt states he is still having a slight of chest pain.     HPI  {History (Optional):23778} Here following recent ER visits for some chest pain.  He has had some chest pains the past but states recent pain has been somewhat different.  Pain has been relatively continuous and sharp and left-sided.  Some pleuritic component.  Denies any recent cough or fever.  Denies any recent injury.  Pain is not reproducible to touch or with movement.  He has been playing some basketball recently but denies any syncope.  Occasional lightheadedness and dizziness.  No clear relation of pain being worse with exercise.  He had recent chest x-rays on both September 30 and October 3 in the ER both unremarkable.  Recent D-dimer normal.  Had multiple recent labs including troponins, CBC, basic metabolic panel which were normal.  COVID screen negative.  Multiple recent EKGs in ER showed no acute findings.  No known history of pericarditis.  He took some over-the-counter ibuprofen without any improvement  His grandmother accompanies him today.  Apparently had several relatives that have died of congestive heart failure complications somewhat early age.  Etiology unclear.  No known family history of HCM.  Past Medical History:  Diagnosis Date   Allergy    rhinitis   Asthma    as a child   Complication of anesthesia    pt. reports he need more anesthesia with the septoplasty surgery   Eosinophilic esophagitis    heartburn and reflux   Family history of adverse reaction to anesthesia    mother respiratory failure   Headache    Nasal fracture    deviated septum    Pneumonia    1x history of   Premature baby    2 months early   Seizures (HCC)    well controlled on meds/ one 2 months ago when meds were late 11/2021   Past Surgical History:  Procedure Laterality Date   ADENOIDECTOMY     CLOSED REDUCTION NASAL FRACTURE N/A 08/31/2017   Procedure: CLOSED REDUCTION NASAL FRACTURE;  Surgeon: Geanie Logan, MD;  Location: [facility];  Service: ENT;  Laterality: N/A;   HARDWARE REMOVAL Right 09/17/2022   Procedure: HARDWARE REMOVAL;  Surgeon: Cammy Copa, MD;  Location: [facility] OR;  Service: Orthopedics;  Laterality: Right;  PROVIDER REQUESTING 2.0 HOURS OPERATING TIME   ORIF HUMERUS FRACTURE Right 05/05/2021   Procedure: reverse Hill-Sachs allografting, biceps tenodesis;  Surgeon: Cammy Copa, MD;  Location: [facility] OR;  Service: Orthopedics;  Laterality: Right;   SEPTOPLASTY N/A 08/31/2017   Procedure: SEPTOPLASTY;  Surgeon: Geanie Logan, MD;  Location: [facility];  Service: ENT;  Laterality: N/A;   SHOULDER ARTHROSCOPY WITH LABRAL REPAIR Left 04/30/2020   Procedure: LEFT SHOULDER POSTERIOR LABRAL REPAIR WITH ARTHROSCOPY, BICEPS TENDON RELEASE AND TENODESIS, OPEN ALLOGRAFT FOR REVERSE BANKART LESION;  Surgeon: Cammy Copa, MD;  Location: [facility] OR;  Service: Orthopedics;  Laterality: Left;   SHOULDER ARTHROSCOPY WITH LABRAL REPAIR Right 05/05/2021   Procedure: right shoulder arthroscopy, biceps release, posterior labral repair;;  Surgeon: Cammy Copa, MD;  Location: [facility] OR;  Service: Orthopedics;  Laterality: Right;   SHOULDER ARTHROSCOPY WITH SUBACROMIAL DECOMPRESSION AND BICEP TENDON REPAIR Right 09/17/2022   Procedure: RIGHT SHOULDER ARTHROSCOPY, SUBSCAPULARIS REPAIR;  Surgeon: Cammy Copa, MD;  Location: [facility] OR;  Service: Orthopedics;  Laterality: Right;   TONSILLECTOMY     and addenoids   UPPER GASTROINTESTINAL ENDOSCOPY  05/15/2022    reports that he has never smoked. He has never used smokeless tobacco. He  reports that he does not drink alcohol and does not use drugs. family history includes Cancer in his mother; Clotting disorder in his mother; Protein C deficiency in his sister. Allergies  Allergen Reactions   Lortab [Hydrocodone-Acetaminophen] Hives   Other Anaphylaxis    Peanuts   Zofran [Ondansetron Hcl] Nausea And Vomiting   Citrus Other (See Comments)    Sores in mouth   Dairy Aid [Tilactase] Nausea And Vomiting   Influenza Virus Vaccine Other (See Comments)    Partial paralysis for a couple of weeks per mom   Soy Allergy Other (See Comments)    Seizures     Review of Systems  Constitutional:  Negative for chills, fever and malaise/fatigue.  Eyes:  Negative for blurred vision.  Respiratory:  Negative for shortness of breath.   Cardiovascular:  Positive for chest pain. Negative for palpitations and leg swelling.  Gastrointestinal:  Negative for abdominal pain.  Neurological:  Negative for dizziness, weakness and headaches.      Objective:     BP 102/88 (BP Location: Right Arm, Patient Position: Sitting, Cuff Size: Normal)   Pulse 85   Temp 97.9 F (36.6 C) (Oral)   Ht 6\' 1"  (1.854 m)   Wt 124 lb 12.8 oz (56.6 kg)   SpO2 98%   BMI 16.47 kg/m  BP Readings from Last 3 Encounters:  05/05/23 102/88  04/29/23 127/75  04/27/23 107/73   Wt Readings from Last 3 Encounters:  05/05/23 124 lb 12.8 oz (56.6 kg)  04/26/23 132 lb (59.9 kg)  04/12/23 127 lb 11.2 oz (57.9 kg)      Physical Exam Vitals reviewed.  Constitutional:      General: He is not in acute distress.    Appearance: Normal appearance. He is not ill-appearing.  Cardiovascular:     Rate and Rhythm: Normal rate and regular rhythm.     Heart sounds: No murmur heard. Pulmonary:     Effort: Pulmonary effort is normal.     Breath sounds: Normal breath sounds. No wheezing or rales.  Musculoskeletal:     Right lower leg: No edema.     Left lower leg: No edema.  Neurological:     Mental Status: He is  alert.      No results found for any visits on 05/05/23.  Last CBC Lab Results  Component Value Date   WBC 3.9 (L) 04/29/2023   HGB 14.6 04/29/2023   HCT 43.9 04/29/2023   MCV 87.5 04/29/2023   MCH 29.1 04/29/2023   RDW 11.6 04/29/2023   PLT 164 04/29/2023   Last metabolic panel Lab Results  Component Value Date   GLUCOSE 80 04/29/2023   NA 138 04/29/2023   K 3.5 04/29/2023   CL 103 04/29/2023   CO2 22 04/29/2023   BUN 8 04/29/2023   CREATININE 1.01 04/29/2023   GFRNONAA >60 04/29/2023   CALCIUM 9.2 04/29/2023   PROT 6.8 02/10/2023   ALBUMIN 4.5 02/10/2023   BILITOT 0.7 02/10/2023   ALKPHOS 75 02/10/2023   AST 26 02/10/2023   ALT  17 02/10/2023   ANIONGAP 13 04/29/2023      The ASCVD Risk score (Arnett DK, et al., 2019) failed to calculate for the following reasons:   The 2019 ASCVD risk score is only valid for ages 85 to 71    Assessment & Plan:   Recent atypical chest pain which has been sharp and relatively constant for roughly 2 weeks.  Multiple ER visits as above unrevealing.  No history of syncope with exercise but does have some dizziness occasionally.  Given persistence of pain and no clear etiology set up echocardiogram to further assess Current pain does not sound suggestive of costochondritis with no reproducible tenderness on exam. Evelena Peat, MD

## 2023-05-05 NOTE — Patient Instructions (Signed)
Will be setting up echocardiogram to further assess chest pain and or shortness of breath.

## 2023-05-07 ENCOUNTER — Telehealth: Payer: Self-pay | Admitting: Neurology

## 2023-05-07 MED ORDER — KEPPRA 1000 MG PO TABS: ORAL_TABLET | ORAL | 0 refills | Status: DC

## 2023-05-07 NOTE — Telephone Encounter (Signed)
Rx sent in for pt.

## 2023-05-07 NOTE — Telephone Encounter (Signed)
1. Which medications need refilled? (List name and dosage, if known) Keppra - needs a 7 day supply sent in  2. Which pharmacy/location is medication to be sent to? (include street and city if local pharmacy) [facility]

## 2023-05-12 ENCOUNTER — Ambulatory Visit: Payer: BC Managed Care – PPO | Admitting: Neurology

## 2023-05-14 ENCOUNTER — Encounter: Payer: Self-pay | Admitting: Neurology

## 2023-05-14 ENCOUNTER — Ambulatory Visit: Payer: BC Managed Care – PPO | Admitting: Neurology

## 2023-05-14 DIAGNOSIS — Z029 Encounter for administrative examinations, unspecified: Secondary | ICD-10-CM

## 2023-05-21 ENCOUNTER — Telehealth: Payer: Self-pay | Admitting: Family Medicine

## 2023-05-21 NOTE — Telephone Encounter (Signed)
Pt states he is still interested in an echocardiogram, as per a previous conversation had with MD.

## 2023-05-24 ENCOUNTER — Other Ambulatory Visit: Payer: BC Managed Care – PPO

## 2023-05-24 DIAGNOSIS — R7989 Other specified abnormal findings of blood chemistry: Secondary | ICD-10-CM

## 2023-05-24 LAB — VITAMIN D 25 HYDROXY (VIT D DEFICIENCY, FRACTURES): VITD: 15.46 ng/mL — ABNORMAL LOW (ref 30.00–100.00)

## 2023-05-24 NOTE — Telephone Encounter (Signed)
Patient is in the process of getting scheduled for echo.

## 2023-05-24 NOTE — Telephone Encounter (Signed)
Noted  

## 2023-05-26 ENCOUNTER — Telehealth: Payer: Self-pay | Admitting: Family Medicine

## 2023-05-26 NOTE — Telephone Encounter (Signed)
Left message for the patient to return my call.

## 2023-05-26 NOTE — Telephone Encounter (Signed)
I spoke with to the patient and he reported that he is allergic to an ingredient in the Vit D prescription so he cannot take this.

## 2023-05-26 NOTE — Telephone Encounter (Signed)
Pt called stating he spoke with care team regarding him taking prescription Vitamin D, Ergocalciferol Says he cannot take prescription vitamin d and you can call if you need to discuss

## 2023-05-28 ENCOUNTER — Ambulatory Visit: Payer: BC Managed Care – PPO | Admitting: Family Medicine

## 2023-05-31 NOTE — Telephone Encounter (Signed)
Left a message for the patient to return my call.  

## 2023-06-03 ENCOUNTER — Ambulatory Visit (HOSPITAL_COMMUNITY)
Admission: RE | Admit: 2023-06-03 | Discharge: 2023-06-03 | Disposition: A | Discharge status: Home / Self Care | Payer: BC Managed Care – PPO | Source: Ambulatory Visit | Attending: Family Medicine | Admitting: Family Medicine

## 2023-06-03 DIAGNOSIS — R079 Chest pain, unspecified: Secondary | ICD-10-CM | POA: Diagnosis not present

## 2023-06-03 DIAGNOSIS — R0789 Other chest pain: Secondary | ICD-10-CM

## 2023-06-03 LAB — ECHOCARDIOGRAM COMPLETE
Area-P 1/2: 2.66 cm2
Calc EF: 56.4 %
S' Lateral: 3.1 cm
Single Plane A2C EF: 56.9 %
Single Plane A4C EF: 58.3 %

## 2023-06-04 NOTE — Telephone Encounter (Signed)
Noted  

## 2023-06-04 NOTE — Telephone Encounter (Signed)
I spoke with the patient and his mother and they reported that he cannot take OTC Vit D due to soy allergy in medication. Patient's mother reported that the patient had to use a specialty pharmacy in [place] previously for Vit D supplementation. Patient has follow up scheduled on 06/11/2023

## 2023-06-09 ENCOUNTER — Telehealth: Payer: Self-pay | Admitting: Family Medicine

## 2023-06-09 MED ORDER — VITAMIN D (ERGOCALCIFEROL) 1.25 MG (50000 UNIT) PO CAPS: 50000.0000 [IU] | ORAL_CAPSULE | ORAL | 0 refills | Status: DC

## 2023-06-09 NOTE — Addendum Note (Signed)
Addended by: Christy Sartorius on: 06/09/2023 04:52 PM   Modules accepted: Orders

## 2023-06-09 NOTE — Telephone Encounter (Signed)
Rx sent 

## 2023-06-09 NOTE — Telephone Encounter (Signed)
Requesting prescription for Vitamin D, Ergocalciferol, (DRISDOL) 1.25 MG (50000 UNIT) CAPS capsule  be sent to [facility] [place] [phone]

## 2023-06-11 ENCOUNTER — Encounter: Payer: Self-pay | Admitting: Family Medicine

## 2023-06-11 ENCOUNTER — Ambulatory Visit: Payer: BC Managed Care – PPO | Admitting: Family Medicine

## 2023-06-11 VITALS — BP 110/70 | HR 59 | Temp 98.2°F | Ht 73.0 in | Wt 129.8 lb

## 2023-06-11 DIAGNOSIS — G4452 New daily persistent headache (NDPH): Secondary | ICD-10-CM | POA: Diagnosis not present

## 2023-06-11 DIAGNOSIS — R0789 Other chest pain: Secondary | ICD-10-CM

## 2023-06-11 NOTE — Progress Notes (Unsigned)
Established Patient Office Visit  Subjective   Patient ID: Terrence Riley, male    DOB: [birth year]  Age: 22 y.o. MRN: [id number]  Chief Complaint  Patient presents with   Medical Management of Chronic Issues    HPI  {History (Optional):23778} Seen today for medical follow-up.  Past medical history includes history of reported migraines, eosinophilic esophagitis, GERD, seizure disorder followed by neurology, history of B12 deficiency  He recent complained of chest pain occasionally at rest but particularly and mostly with exercise.  Seem to be worse with more extreme exertion.  No history of syncope.  Chest pain can sometimes last couple hours following exercise.  We sent for echocardiogram and reviewed this today.  EF 55 to 60%.  No major valve issues.  No left ventricular hypertrophy.  Pain is mostly left-sided.  No pleuritic component.  Has had multiple EKGs mostly through the ER with high voltage.  Probably early repolarization changes suggesting possible pericarditis but this was not noted on his recent echo.  Spoke with patient's mom today and she relates that she had a 31 year old cousin that had congestive heart failure but they never determine her calls.  She also relates an aunt had "enlarged heart "and died in her 62s.  She was uncertain regarding specific etiology for either 1 of those family members.  [name] has never smoked.  No illicit drug use.  No cocaine use.  He has continued to have almost daily bilateral dull headaches.  We have tried nortriptyline up to 20 mg at night without much improvement.  He has follow-up soon with his neurologist to discuss further.  He denies any recent seizures.  Remains on Keppra.  Past Medical History:  Diagnosis Date   Allergy    rhinitis   Asthma    as a child   Complication of anesthesia    pt. reports he need more anesthesia with the septoplasty surgery   Eosinophilic esophagitis    heartburn and reflux   Family history of  adverse reaction to anesthesia    mother respiratory failure   Headache    Nasal fracture    deviated septum   Pneumonia    1x history of   Premature baby    2 months early   Seizures (HCC)    well controlled on meds/ one 2 months ago when meds were late 11/2021   Past Surgical History:  Procedure Laterality Date   ADENOIDECTOMY     CLOSED REDUCTION NASAL FRACTURE N/A 08/31/2017   Procedure: CLOSED REDUCTION NASAL FRACTURE;  Surgeon: Geanie Logan, MD;  Location: [facility];  Service: ENT;  Laterality: N/A;   HARDWARE REMOVAL Right 09/17/2022   Procedure: HARDWARE REMOVAL;  Surgeon: Cammy Copa, MD;  Location: [facility] OR;  Service: Orthopedics;  Laterality: Right;  PROVIDER REQUESTING 2.0 HOURS OPERATING TIME   ORIF HUMERUS FRACTURE Right 05/05/2021   Procedure: reverse Hill-Sachs allografting, biceps tenodesis;  Surgeon: Cammy Copa, MD;  Location: [facility] OR;  Service: Orthopedics;  Laterality: Right;   SEPTOPLASTY N/A 08/31/2017   Procedure: SEPTOPLASTY;  Surgeon: Geanie Logan, MD;  Location: [facility];  Service: ENT;  Laterality: N/A;   SHOULDER ARTHROSCOPY WITH LABRAL REPAIR Left 04/30/2020   Procedure: LEFT SHOULDER POSTERIOR LABRAL REPAIR WITH ARTHROSCOPY, BICEPS TENDON RELEASE AND TENODESIS, OPEN ALLOGRAFT FOR REVERSE BANKART LESION;  Surgeon: Cammy Copa, MD;  Location: [facility] OR;  Service: Orthopedics;  Laterality: Left;   SHOULDER ARTHROSCOPY WITH LABRAL REPAIR Right 05/05/2021  Procedure: right shoulder arthroscopy, biceps release, posterior labral repair;;  Surgeon: Cammy Copa, MD;  Location: [facility] OR;  Service: Orthopedics;  Laterality: Right;   SHOULDER ARTHROSCOPY WITH SUBACROMIAL DECOMPRESSION AND BICEP TENDON REPAIR Right 09/17/2022   Procedure: RIGHT SHOULDER ARTHROSCOPY, SUBSCAPULARIS REPAIR;  Surgeon: Cammy Copa, MD;  Location: [facility] OR;  Service: Orthopedics;  Laterality: Right;   TONSILLECTOMY     and addenoids   UPPER  GASTROINTESTINAL ENDOSCOPY  05/15/2022    reports that he has never smoked. He has never used smokeless tobacco. He reports that he does not drink alcohol and does not use drugs. family history includes Cancer in his mother; Clotting disorder in his mother; Protein C deficiency in his sister. Allergies  Allergen Reactions   Lortab [Hydrocodone-Acetaminophen] Hives   Other Anaphylaxis    Peanuts   Zofran [Ondansetron Hcl] Nausea And Vomiting   Citrus Other (See Comments)    Sores in mouth   Dairy Aid [Tilactase] Nausea And Vomiting   Influenza Virus Vaccine Other (See Comments)    Partial paralysis for a couple of weeks per mom   Soy Allergy Other (See Comments)    Seizures     Review of Systems  Constitutional:  Negative for chills, fever and malaise/fatigue.  Eyes:  Negative for blurred vision.  Respiratory:  Negative for cough and shortness of breath.   Cardiovascular:  Positive for chest pain. Negative for palpitations, orthopnea, leg swelling and PND.  Neurological:  Negative for dizziness, weakness and headaches.      Objective:     BP 110/70 (BP Location: Left Arm, Patient Position: Sitting, Cuff Size: Normal)   Pulse (!) 59   Temp 98.2 F (36.8 C) (Oral)   Ht 6\' 1"  (1.854 m)   Wt 129 lb 12.8 oz (58.9 kg)   SpO2 99%   BMI 17.13 kg/m  {Vitals History (Optional):23777}  Physical Exam Vitals reviewed.  Constitutional:      Appearance: He is well-developed.  Eyes:     Pupils: Pupils are equal, round, and reactive to light.  Neck:     Thyroid: No thyromegaly.  Cardiovascular:     Rate and Rhythm: Normal rate and regular rhythm.  Pulmonary:     Effort: Pulmonary effort is normal. No respiratory distress.     Breath sounds: Normal breath sounds. No wheezing or rales.  Musculoskeletal:     Cervical back: Neck supple.  Neurological:     Mental Status: He is alert and oriented to person, place, and time.      No results found for any visits on  06/11/23.  {Labs (Optional):23779}  The ASCVD Risk score (Arnett DK, et al., 2019) failed to calculate for the following reasons:   The 2019 ASCVD risk score is only valid for ages 96 to 66    Assessment & Plan:   #1 atypical chest pains.  Occur mostly with exertion.  Recent echocardiogram unremarkable.  No history of exercise associated syncope.  Denies any palpitations or suspicion for significant arrhythmia.  He does have history of GERD and eosinophilic esophagitis but denies any dysphagia and current symptoms do not suggest likely GERD -Discussed setting up cardiology referral  #2 persistent headaches.  Recent CT head unremarkable.  He does have reported history of migraines but describes a dull bilateral headache which sounds more like a daily persistent tension type headache.  Not responsive to TCA.  We have discussed previously avoidance of daily use of analgesics.  He has pending follow-up with  neurology.  Evelena Peat, MD

## 2023-06-11 NOTE — Patient Instructions (Signed)
I will be setting up cardiology referral.

## 2023-06-14 ENCOUNTER — Telehealth: Payer: Self-pay | Admitting: Neurology

## 2023-06-14 MED ORDER — MIRTAZAPINE 15 MG PO TABS: 15.0000 mg | ORAL_TABLET | Freq: Every day | ORAL | 1 refills | Status: DC

## 2023-06-14 NOTE — Telephone Encounter (Signed)
Caller states pt needs a note for college admission stating he is and has been treated for encephalopathy

## 2023-06-15 NOTE — Telephone Encounter (Signed)
Pls clarify what is needed and why he needs the letter. 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 If he is okay with disclosing this, we can write letter about his diagnosis. Thanks

## 2023-06-15 NOTE — Telephone Encounter (Signed)
Pt called no answer left a voice mail to call the office back  °

## 2023-06-16 ENCOUNTER — Encounter: Payer: Self-pay | Admitting: Neurology

## 2023-06-16 ENCOUNTER — Telehealth: Payer: Self-pay | Admitting: Neurology

## 2023-06-16 NOTE — Telephone Encounter (Signed)
Pt sent a mychart message with information.

## 2023-06-16 NOTE — Telephone Encounter (Signed)
Pt came in wanting to see if the letter was ready. I let him know we needed more information on what was needed in the letter. 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

## 2023-06-17 ENCOUNTER — Other Ambulatory Visit: Payer: Self-pay | Admitting: Neurology

## 2023-06-18 ENCOUNTER — Emergency Department (HOSPITAL_COMMUNITY): Payer: BC Managed Care – PPO

## 2023-06-18 ENCOUNTER — Other Ambulatory Visit: Payer: Self-pay

## 2023-06-18 ENCOUNTER — Encounter (HOSPITAL_COMMUNITY): Payer: Self-pay

## 2023-06-18 ENCOUNTER — Emergency Department (HOSPITAL_COMMUNITY)
Admission: EM | Admit: 2023-06-18 | Discharge: 2023-06-19 | Discharge status: Against Medical Advice | Payer: BC Managed Care – PPO | Attending: Emergency Medicine | Admitting: Emergency Medicine

## 2023-06-18 DIAGNOSIS — R0789 Other chest pain: Secondary | ICD-10-CM | POA: Diagnosis not present

## 2023-06-18 DIAGNOSIS — R079 Chest pain, unspecified: Secondary | ICD-10-CM | POA: Diagnosis not present

## 2023-06-18 DIAGNOSIS — R519 Headache, unspecified: Secondary | ICD-10-CM | POA: Insufficient documentation

## 2023-06-18 DIAGNOSIS — Z5321 Procedure and treatment not carried out due to patient leaving prior to being seen by health care provider: Secondary | ICD-10-CM | POA: Diagnosis not present

## 2023-06-18 DIAGNOSIS — H538 Other visual disturbances: Secondary | ICD-10-CM | POA: Insufficient documentation

## 2023-06-18 LAB — BASIC METABOLIC PANEL
Anion gap: 10 (ref 5–15)
BUN: 6 mg/dL (ref 6–20)
CO2: 26 mmol/L (ref 22–32)
Calcium: 9.6 mg/dL (ref 8.9–10.3)
Chloride: 103 mmol/L (ref 98–111)
Creatinine, Ser: 1.08 mg/dL (ref 0.61–1.24)
GFR, Estimated: >60 mL/min (ref >60)
Glucose, Bld: 107 mg/dL — ABNORMAL HIGH (ref 70–99)
Potassium: 3.6 mmol/L (ref 3.5–5.1)
Sodium: 139 mmol/L (ref 135–145)

## 2023-06-18 LAB — CBC
HCT: 42.5 % (ref 39.0–52.0)
Hemoglobin: 14.5 g/dL (ref 13.0–17.0)
MCH: 29.1 pg (ref 26.0–34.0)
MCHC: 34.1 g/dL (ref 30.0–36.0)
MCV: 85.3 fL (ref 80.0–100.0)
Platelets: 165 10*3/uL (ref 150–400)
RBC: 4.98 MIL/uL (ref 4.22–5.81)
RDW: 11.9 % (ref 11.5–15.5)
WBC: 8.6 10*3/uL (ref 4.0–10.5)
nRBC: 0 % (ref 0.0–0.2)

## 2023-06-18 LAB — TROPONIN I (HIGH SENSITIVITY): Troponin I (High Sensitivity): 7 ng/L (ref <18)

## 2023-06-18 NOTE — ED Provider Triage Note (Cosign Needed)
Emergency Medicine Provider Triage Evaluation Note  Terrence Riley , a 22 y.o. male  was evaluated in triage.  Pt complains of chest pain and HA.  Chest pain has been going on for couple of months.  Had an echo 2 weeks ago that showed normal biventricular function.  Headache started 2 weeks ago endorses blurry vision in both eyes.  Denies fever or nuchal rigidity.  Review of Systems  Positive: See above Negative: See above  Physical Exam  There were no vitals taken for this visit. Gen:   Awake, no distress   Resp:  Normal effort  MSK:   Moves extremities without difficulty  Other:    Medical Decision Making  Medically screening exam initiated at 9:23 PM.  Appropriate orders placed.  Terrence Riley was informed that the remainder of the evaluation will be completed by another provider, this initial triage assessment does not replace that evaluation, and the importance of remaining in the ED until their evaluation is complete.  Work up started   Terrence Riley, Cordelia Poche 06/18/23 2124

## 2023-06-18 NOTE — ED Triage Notes (Signed)
Coming in for chest pain that he has been evaluated for by his MD for an echo and multiple cardiac workups, he is supposed to see a cardiologist as well. The pain has been on going with some shortness of breath and a new onset of right sided neck pain/ vision blurriness. No leg swelling or edema at this time.

## 2023-06-19 LAB — TROPONIN I (HIGH SENSITIVITY): Troponin I (High Sensitivity): 11 ng/L (ref <18)

## 2023-06-19 NOTE — ED Notes (Signed)
Patient approached this tech in the lobby stating "I need to leave; I'm checking out of the ED."

## 2023-07-05 ENCOUNTER — Encounter: Payer: Self-pay | Admitting: Family Medicine

## 2023-07-15 ENCOUNTER — Other Ambulatory Visit: Payer: Self-pay | Admitting: Family Medicine

## 2023-07-28 NOTE — Progress Notes (Deleted)
 Cardiology Office Note:    Date:  07/28/2023   ID:  Terrence Riley, DOB 13-Mar-2001, MRN [id number]  PCP:  Micheal Wolm ORN, MD   [facility] Providers Cardiologist:  None { Click to update primary MD,subspecialty MD or APP then REFRESH:1}    Referring MD: Micheal Wolm ORN, MD   No chief complaint on file. ***  History of Present Illness:    Terrence Riley is a 23 y.o. male seen at the request of Dr Micheal for evaluation of chest pain. Ecgs have shown early repolarization pattern. Echo recently was normal.   Past Medical History:  Diagnosis Date   Allergy    rhinitis   Asthma    as a child   Complication of anesthesia    pt. reports he need more anesthesia with the septoplasty surgery   Eosinophilic esophagitis    heartburn and reflux   Family history of adverse reaction to anesthesia    mother respiratory failure   Headache    Nasal fracture    deviated septum   Pneumonia    1x history of   Premature baby    2 months early   Seizures (HCC)    well controlled on meds/ one 2 months ago when meds were late 11/2021    Past Surgical History:  Procedure Laterality Date   ADENOIDECTOMY     CLOSED REDUCTION NASAL FRACTURE N/A 08/31/2017   Procedure: CLOSED REDUCTION NASAL FRACTURE;  Surgeon: Blair Mt, MD;  Location: [facility];  Service: ENT;  Laterality: N/A;   HARDWARE REMOVAL Right 09/17/2022   Procedure: HARDWARE REMOVAL;  Surgeon: Addie Cordella Hamilton, MD;  Location: [facility] OR;  Service: Orthopedics;  Laterality: Right;  PROVIDER REQUESTING 2.0 HOURS OPERATING TIME   ORIF HUMERUS FRACTURE Right 05/05/2021   Procedure: reverse Hill-Sachs allografting, biceps tenodesis;  Surgeon: Addie Cordella Hamilton, MD;  Location: [facility] OR;  Service: Orthopedics;  Laterality: Right;   SEPTOPLASTY N/A 08/31/2017   Procedure: SEPTOPLASTY;  Surgeon: Blair Mt, MD;  Location: [facility];  Service: ENT;  Laterality: N/A;   SHOULDER ARTHROSCOPY WITH  LABRAL REPAIR Left 04/30/2020   Procedure: LEFT SHOULDER POSTERIOR LABRAL REPAIR WITH ARTHROSCOPY, BICEPS TENDON RELEASE AND TENODESIS, OPEN ALLOGRAFT FOR REVERSE BANKART LESION;  Surgeon: Addie Cordella Hamilton, MD;  Location: [facility] OR;  Service: Orthopedics;  Laterality: Left;   SHOULDER ARTHROSCOPY WITH LABRAL REPAIR Right 05/05/2021   Procedure: right shoulder arthroscopy, biceps release, posterior labral repair;;  Surgeon: Addie Cordella Hamilton, MD;  Location: [facility] OR;  Service: Orthopedics;  Laterality: Right;   SHOULDER ARTHROSCOPY WITH SUBACROMIAL DECOMPRESSION AND BICEP TENDON REPAIR Right 09/17/2022   Procedure: RIGHT SHOULDER ARTHROSCOPY, SUBSCAPULARIS REPAIR;  Surgeon: Addie Cordella Hamilton, MD;  Location: [facility] OR;  Service: Orthopedics;  Laterality: Right;   TONSILLECTOMY     and addenoids   UPPER GASTROINTESTINAL ENDOSCOPY  05/15/2022    Current Medications: No outpatient medications have been marked as taking for the 08/05/23 encounter (Appointment) with Terrence Hancox M, MD.     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  Tobacco Use   Smoking  status: Never   Smokeless tobacco: Never  Vaping Use   Vaping status: Never Used  Substance and Sexual Activity   Alcohol use: Never   Drug use: Never   Sexual activity: Never  Other Topics Concern   Not on file  Social History Narrative   Right handed   Lives at home with mom goes to college   Social Drivers of Health   Financial Resource Strain: Not on file  Food Insecurity: Not on file  Transportation Needs: Not on file  Physical Activity: Not on file  Stress: Not  on file  Social Connections: Not on file     Family History: The patient's ***family history includes Cancer in his mother; Clotting disorder in his mother; Protein C deficiency in his sister. There is no history of Stomach cancer, Esophageal cancer, or Colon cancer.  ROS:   Please see the history of present illness.    *** All other systems reviewed and are negative.  EKGs/Labs/Other Studies Reviewed:    The following studies were reviewed today: Echo 06/03/23: IMPRESSIONS     1. Left ventricular ejection fraction, by estimation, is 55 to 60%. The  left ventricle has normal function. The left ventricle has no regional  wall motion abnormalities. Left ventricular diastolic parameters were  normal. The average left ventricular  global longitudinal strain is -21.9 %. The global longitudinal strain is  normal.   2. Right ventricular systolic function is normal. The right ventricular  size is normal. There is normal pulmonary artery systolic pressure.   3. The mitral valve is normal in structure. No evidence of mitral valve  regurgitation. No evidence of mitral stenosis.   4. The aortic valve is tricuspid. Aortic valve regurgitation is not  visualized. No aortic stenosis is present.   Comparison(s): No prior Echocardiogram.        Recent Labs: 02/10/2023: ALT 17 06/18/2023: BUN 6; Creatinine, Ser 1.08; Hemoglobin 14.5; Platelets 165; Potassium 3.6; Sodium 139  Recent Lipid Panel    Component Value Date/Time   CHOL 125 03/12/2020 1004   TRIG 51 03/12/2020 1004   HDL 49 03/12/2020 1004   CHOLHDL 2.6 03/12/2020 1004   LDLCALC 63 03/12/2020 1004     Risk Assessment/Calculations:   {Does this patient have ATRIAL FIBRILLATION?:[phone]}  No BP recorded.  {Refresh Note OR Click here to enter BP  :1}***         Physical Exam:    VS:  There were no vitals taken for this visit.    Wt Readings from Last 3 Encounters:  06/11/23 129 lb 12.8 oz (58.9 kg)  05/05/23 124 lb  12.8 oz (56.6 kg)  04/26/23 132 lb (59.9 kg)     GEN: *** Well nourished, well developed in no acute distress HEENT: Normal NECK: No JVD; No carotid bruits LYMPHATICS: No lymphadenopathy CARDIAC: ***RRR, no murmurs, rubs, gallops RESPIRATORY:  Clear to auscultation without rales, wheezing or rhonchi  ABDOMEN: Soft, non-tender, non-distended MUSCULOSKELETAL:  No edema; No deformity  SKIN: Warm and dry NEUROLOGIC:  Alert and oriented x 3 PSYCHIATRIC:  Normal affect   ASSESSMENT:    No diagnosis found. PLAN:    In order of problems listed above:  ***      {Are you ordering a CV Procedure (e.g. stress test, cath, DCCV, TEE, etc)?   Press F2        :[id number]}    Medication Adjustments/Labs and Tests Ordered: Current medicines are reviewed at length with the patient today.  Concerns  regarding medicines are outlined above.  No orders of the defined types were placed in this encounter.  No orders of the defined types were placed in this encounter.   There are no Patient Instructions on file for this visit.   Signed, Terrence Standing, MD  07/28/2023 8:24 AM    [facility]

## 2023-08-03 ENCOUNTER — Encounter: Payer: Self-pay | Admitting: Neurology

## 2023-08-03 ENCOUNTER — Ambulatory Visit: Payer: BC Managed Care – PPO | Admitting: Neurology

## 2023-08-03 DIAGNOSIS — Z029 Encounter for administrative examinations, unspecified: Secondary | ICD-10-CM

## 2023-08-05 ENCOUNTER — Ambulatory Visit: Payer: BC Managed Care – PPO | Attending: Cardiology | Admitting: Cardiology

## 2023-08-06 ENCOUNTER — Encounter: Payer: Self-pay | Admitting: Neurology

## 2023-08-19 ENCOUNTER — Encounter: Payer: Self-pay | Admitting: Neurology

## 2023-08-19 ENCOUNTER — Encounter: Payer: Self-pay | Admitting: Emergency Medicine

## 2023-08-19 ENCOUNTER — Other Ambulatory Visit: Payer: Self-pay

## 2023-08-19 ENCOUNTER — Emergency Department
Admission: EM | Admit: 2023-08-19 | Discharge: 2023-08-19 | Disposition: A | Discharge status: Home / Self Care | Payer: BC Managed Care – PPO | Attending: Emergency Medicine | Admitting: Emergency Medicine

## 2023-08-19 DIAGNOSIS — D72819 Decreased white blood cell count, unspecified: Secondary | ICD-10-CM | POA: Insufficient documentation

## 2023-08-19 DIAGNOSIS — R45851 Suicidal ideations: Secondary | ICD-10-CM | POA: Insufficient documentation

## 2023-08-19 DIAGNOSIS — F32A Depression, unspecified: Secondary | ICD-10-CM | POA: Diagnosis not present

## 2023-08-19 LAB — COMPREHENSIVE METABOLIC PANEL
ALT: 24 U/L (ref 0–44)
AST: 28 U/L (ref 15–41)
Albumin: 4.7 g/dL (ref 3.5–5.0)
Alkaline Phosphatase: 86 U/L (ref 38–126)
Anion gap: 14 (ref 5–15)
BUN: 11 mg/dL (ref 6–20)
CO2: 24 mmol/L (ref 22–32)
Calcium: 9.7 mg/dL (ref 8.9–10.3)
Chloride: 102 mmol/L (ref 98–111)
Creatinine, Ser: 1.05 mg/dL (ref 0.61–1.24)
GFR, Estimated: >60 mL/min (ref >60)
Glucose, Bld: 96 mg/dL (ref 70–99)
Potassium: 4 mmol/L (ref 3.5–5.1)
Sodium: 140 mmol/L (ref 135–145)
Total Bilirubin: 0.7 mg/dL (ref 0.0–1.2)
Total Protein: 7.6 g/dL (ref 6.5–8.1)

## 2023-08-19 LAB — CBC
HCT: 45.6 % (ref 39.0–52.0)
Hemoglobin: 15.5 g/dL (ref 13.0–17.0)
MCH: 29.4 pg (ref 26.0–34.0)
MCHC: 34 g/dL (ref 30.0–36.0)
MCV: 86.4 fL (ref 80.0–100.0)
Platelets: 163 10*3/uL (ref 150–400)
RBC: 5.28 MIL/uL (ref 4.22–5.81)
RDW: 11.9 % (ref 11.5–15.5)
WBC: 3.7 10*3/uL — ABNORMAL LOW (ref 4.0–10.5)
nRBC: 0 % (ref 0.0–0.2)

## 2023-08-19 LAB — ACETAMINOPHEN LEVEL: Acetaminophen (Tylenol), Serum: <10 ug/mL — ABNORMAL LOW (ref 10–30)

## 2023-08-19 LAB — SALICYLATE LEVEL: Salicylate Lvl: <7 mg/dL — ABNORMAL LOW (ref 7.0–30.0)

## 2023-08-19 LAB — ETHANOL: Alcohol, Ethyl (B): <10 mg/dL (ref <10)

## 2023-08-19 NOTE — ED Notes (Signed)
Patient belongings: 1 cross necklace 1 blue toboggan 2 earrings 1 watch 1 keychain with lanyard 1 blue tshirt 1 white tank top 2 white tennis shoes 1 cell phone 2 socks 1 blue jeans 1 wallet 1 purple jacket 1 gray underwear

## 2023-08-19 NOTE — ED Notes (Signed)
Pt declines to stay for any further evaluation, informed Dr. Larinda Buttery who went to talk to patient.   DC instructions discussed, pt ambulatory without difficulty, encouraged to follow up with PCP Dr. Caryl Never and at [facility].  Pt states he drove himself here. Denies any needs at this time. Pt calm and cooperative at this time.

## 2023-08-19 NOTE — ED Triage Notes (Signed)
Patient ambulatory to triage with stating he wants to see behavioral health due to wanting to hurt himself. Denies plan. States that he has been feeling increasingly stressed and depressed.

## 2023-08-19 NOTE — ED Notes (Addendum)
Pt standing in corner of the room, denies any needs at this time. Ambulatory without difficulty.  Provided blanket, but does not to lay down, offered to dim the lights but declined. Discussed plan of care with patient. Pt verbalized understanding.

## 2023-08-19 NOTE — ED Provider Triage Note (Signed)
Emergency Medicine Provider Triage Evaluation Note  Terrence Riley , a 23 y.o. male  was evaluated in triage.  Pt complains of suicidal ideation.  Wants to be seen do it to his depression. patient denies a plan  Review of Systems  Positive: Negative:  Physical Exam  BP 124/74   Pulse 66   Temp 98.1 F (36.7 C) (Oral)   Resp 18   Ht 6\' 1"  (1.854 m)   Wt 58.5 kg   SpO2 100%   BMI 17.02 kg/m  Gen:   Awake, no distress   Resp:  Normal effort  MSK:   Moves extremities without difficulty  Other:    Medical Decision Making  Medically screening exam initiated at 9:35 PM.  Appropriate orders placed.  Terrence Riley was informed that the remainder of the evaluation will be completed by another provider, this initial triage assessment does not replace that evaluation, and the importance of remaining in the ED until their evaluation is complete.  Patient will be admitted in behavioral   Gladys Damme, [place] 08/19/23 2136

## 2023-08-19 NOTE — ED Provider Notes (Addendum)
[facility] Provider Note    Event Date/Time   First MD Initiated Contact with Patient 08/19/23 2152     (approximate)   History   Chief Complaint Mental Health Problem   HPI  Terrence Riley is a 23 y.o. male with past medical history of seizures and migraines who presents to the ED complaining of suicidal ideation.  Patient reports that he has been feeling depressed lately and had thoughts of suicide earlier this evening.  He states that he is no longer having these thoughts and never had a plan of how he might harm himself.  He denies hearing voices or having thoughts of hurting others.  He states he has been taking his medications as prescribed, denies any alcohol or drug use.  He denies any medical complaints at this time.     Physical Exam   Triage Vital Signs: ED Triage Vitals [08/19/23 2133]  Encounter Vitals Group     BP 124/74     Systolic BP Percentile      Diastolic BP Percentile      Pulse Rate 66     Resp 18     Temp 98.1 F (36.7 C)     Temp Source Oral     SpO2 100 %     Weight 129 lb (58.5 kg)     Height 6\' 1"  (1.854 m)     Head Circumference      Peak Flow      Pain Score 0     Pain Loc      Pain Education      Exclude from Growth Chart     Most recent vital signs: Vitals:   08/19/23 2133 08/19/23 2153  BP: 124/74 124/71  Pulse: 66 61  Resp: 18 16  Temp: 98.1 F (36.7 C) 97.7 F (36.5 C)  SpO2: 100% 99%    Constitutional: Alert and oriented. Eyes: Conjunctivae are normal. Head: Atraumatic. Nose: No congestion/rhinnorhea. Mouth/Throat: Mucous membranes are moist.  Cardiovascular: Normal rate, regular rhythm. Grossly normal heart sounds.  2+ radial pulses bilaterally. Respiratory: Normal respiratory effort.  No retractions. Lungs CTAB. Gastrointestinal: Soft and nontender. No distention. Musculoskeletal: No lower extremity tenderness nor edema.  Neurologic:  Normal speech and language. No gross focal  neurologic deficits are appreciated.    ED Results / Procedures / Treatments   Labs (all labs ordered are listed, but only abnormal results are displayed) Labs Reviewed  SALICYLATE LEVEL - Abnormal; Notable for the following components:      Result Value   Salicylate Lvl <7.0 (*)    All other components within normal limits  ACETAMINOPHEN LEVEL - Abnormal; Notable for the following components:   Acetaminophen (Tylenol), Serum <10 (*)    All other components within normal limits  CBC - Abnormal; Notable for the following components:   WBC 3.7 (*)    All other components within normal limits  COMPREHENSIVE METABOLIC PANEL  ETHANOL  URINE DRUG SCREEN, QUALITATIVE ([facility] ONLY)    PROCEDURES:  Critical Care performed: No  Procedures   MEDICATIONS ORDERED IN ED: Medications - No data to display   IMPRESSION / MDM / ASSESSMENT AND PLAN / ED COURSE  I reviewed the triage vital signs and the nursing notes.                              23 y.o. male with past medical history of seizures and  migraines who presents to the ED complaining of recent depression with thoughts of suicide earlier this evening that have since resolved.  Patient's presentation is most consistent with acute presentation with potential threat to life or bodily function.  Differential diagnosis includes, but is not limited to, depression, anxiety, psychosis, suicidal ideation, medication noncompliance, substance abuse.  Patient nontoxic-appearing and in no acute distress, vital signs are unremarkable.  Labs show mild leukopenia but no significant anemia, electrolyte abnormality, or AKI.  LFTs are unremarkable, Tylenol and salicylate levels are undetectable.  Patient may be medically cleared for psychiatric disposition.  He is calm and cooperative and we will maintain voluntary status for now.  Psych eval pending at this time.  The patient has been placed in psychiatric observation due to the need to provide a  safe environment for the patient while obtaining psychiatric consultation and evaluation, as well as ongoing medical and medication management to treat the patient's condition.  The patient has not been placed under full IVC at this time.  ----------------------------------------- 11:09 PM on 08/19/2023 ----------------------------------------- Patient now requesting to be discharged home, states that his mother is home alone and he needs to be home with her.  He continues to deny any active suicidal ideation, states that he will follow-up with his primary care doctor and he was also given a referral to [facility].  He was counseled to return to the ED for new or worsening symptoms, patient agrees with plan.      FINAL CLINICAL IMPRESSION(S) / ED DIAGNOSES   Final diagnoses:  Depression, unspecified depression type     Rx / DC Orders   ED Discharge Orders     None        Note:  This document was prepared using Dragon voice recognition software and may include unintentional dictation errors.   Chesley Noon, MD 08/19/23 6010    Chesley Noon, MD 08/19/23 [phone]

## 2023-08-23 ENCOUNTER — Telehealth: Payer: Self-pay | Admitting: Neurology

## 2023-08-23 NOTE — Telephone Encounter (Signed)
Patient dismissed:  We find it necessary to unform you that [facility] and all providers practicing at this clinic will no longer be able to provide medical care to you.  We made thus decision due to ur No Show / Late Policy.

## 2023-08-28 ENCOUNTER — Other Ambulatory Visit: Payer: Self-pay | Admitting: Family Medicine

## 2023-08-31 ENCOUNTER — Encounter: Payer: Self-pay | Admitting: Family Medicine

## 2023-08-31 ENCOUNTER — Ambulatory Visit (INDEPENDENT_AMBULATORY_CARE_PROVIDER_SITE_OTHER): Payer: BC Managed Care – PPO | Admitting: Family Medicine

## 2023-08-31 VITALS — BP 98/68 | HR 46 | Temp 98.2°F | Ht 73.0 in | Wt 125.9 lb

## 2023-08-31 DIAGNOSIS — F339 Major depressive disorder, recurrent, unspecified: Secondary | ICD-10-CM

## 2023-08-31 DIAGNOSIS — G4700 Insomnia, unspecified: Secondary | ICD-10-CM | POA: Diagnosis not present

## 2023-08-31 NOTE — Patient Instructions (Signed)
Get back on the Mirtazepine one at night  Let me know if we still need to pursue neurology referral.

## 2023-08-31 NOTE — Progress Notes (Signed)
 Established Patient Office Visit  Subjective   Patient ID: MAIKA KACZMAREK, male    DOB: [birth year]  Age: 23 y.o. MRN: [id number]  Chief Complaint  Patient presents with   Medical Management of Chronic Issues    Patient needs a referral to neuro, patient has been dropped by office, also patient is not sleeping at night     HPI   [name] has seizure disorder and was seen today with chief complaint of needing a neuro referral.  Apparently he missed an appointment with his current neurologist and was told he would need to find another provider.  He plans to go by their office and check again before he pursues getting another referral.  He currently takes Keppra  and Klonopin  and denies any recent seizures.  He states he is about to run out of Klonopin .  He had recent issues with difficulty sleeping.  He states he has good appetite but has had difficulty gaining weight.  Recently seen in the ER for some suicidal ideation and states he has none at this time.  He had called for refills of mirtazapine  recently.  This has helped some in the past.  Denies any specific stressors.  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  He is currently out of school.  He had difficulty maintaining grades last semester.  No alcohol use.  No illicit drug use.  Past Medical History:  Diagnosis Date   Allergy    rhinitis   Asthma    as a child   Complication of anesthesia    pt. reports he need more anesthesia with the septoplasty surgery   Eosinophilic esophagitis    heartburn and reflux   Family history of adverse reaction to anesthesia    mother respiratory failure   Headache    Nasal fracture    deviated septum   Pneumonia    1x history of   Premature baby    2 months early   Seizures (HCC)    well controlled on meds/ one 2 months ago when meds were late 11/2021   Past Surgical History:  Procedure Laterality Date    ADENOIDECTOMY     CLOSED REDUCTION NASAL FRACTURE N/A 08/31/2017   Procedure: CLOSED REDUCTION NASAL FRACTURE;  Surgeon: Blair Mt, MD;  Location: [facility];  Service: ENT;  Laterality: N/A;   HARDWARE REMOVAL Right 09/17/2022   Procedure: HARDWARE REMOVAL;  Surgeon: Addie Cordella Hamilton, MD;  Location: [facility] OR;  Service: Orthopedics;  Laterality: Right;  PROVIDER REQUESTING 2.0 HOURS OPERATING TIME   ORIF HUMERUS FRACTURE Right 05/05/2021   Procedure: reverse Hill-Sachs allografting, biceps tenodesis;  Surgeon: Addie Cordella Hamilton, MD;  Location: [facility] OR;  Service: Orthopedics;  Laterality: Right;   SEPTOPLASTY N/A 08/31/2017   Procedure: SEPTOPLASTY;  Surgeon: Blair Mt, MD;  Location: [facility];  Service: ENT;  Laterality: N/A;   SHOULDER ARTHROSCOPY WITH LABRAL REPAIR Left 04/30/2020   Procedure: LEFT SHOULDER POSTERIOR LABRAL REPAIR WITH ARTHROSCOPY, BICEPS TENDON RELEASE AND TENODESIS, OPEN ALLOGRAFT FOR REVERSE BANKART LESION;  Surgeon: Addie Cordella Hamilton, MD;  Location: [facility] OR;  Service: Orthopedics;  Laterality: Left;   SHOULDER ARTHROSCOPY WITH LABRAL REPAIR Right 05/05/2021   Procedure: right shoulder arthroscopy, biceps release, posterior labral repair;;  Surgeon: Addie Cordella Hamilton, MD;  Location: [facility] OR;  Service: Orthopedics;  Laterality: Right;   SHOULDER ARTHROSCOPY WITH SUBACROMIAL DECOMPRESSION AND BICEP TENDON REPAIR Right 09/17/2022  Procedure: RIGHT SHOULDER ARTHROSCOPY, SUBSCAPULARIS REPAIR;  Surgeon: Addie Cordella Hamilton, MD;  Location: [facility] OR;  Service: Orthopedics;  Laterality: Right;   TONSILLECTOMY     and addenoids   UPPER GASTROINTESTINAL ENDOSCOPY  05/15/2022    reports that he has never smoked. He has never used smokeless tobacco. He reports that he does not drink alcohol and does not use drugs. family history includes Cancer in his mother; Clotting disorder in his mother; Protein C deficiency in his sister. Allergies  Allergen Reactions    Lortab [Hydrocodone-Acetaminophen ] Hives   Other Anaphylaxis    Peanuts   Zofran  [Ondansetron  Hcl] Nausea And Vomiting   Citrus Other (See Comments)    Sores in mouth   Dairy Aid [Tilactase] Nausea And Vomiting   Influenza Virus Vaccine Other (See Comments)    Partial paralysis for a couple of weeks per mom   Soy Allergy (Do Not Select) Other (See Comments)    Seizures     Review of Systems  Constitutional:  Negative for malaise/fatigue.  Eyes:  Negative for blurred vision.  Respiratory:  Negative for shortness of breath.   Cardiovascular:  Negative for chest pain.  Neurological:  Negative for dizziness, seizures, loss of consciousness, weakness and headaches.  Psychiatric/Behavioral:  Negative for substance abuse and suicidal ideas. The patient has insomnia.       Objective:     BP 98/68 (BP Location: Left Arm, Patient Position: Sitting, Cuff Size: Normal)   Pulse (!) 46   Temp 98.2 F (36.8 C) (Oral)   Ht 6' 1 (1.854 m)   Wt 125 lb 14.4 oz (57.1 kg)   SpO2 97%   BMI 16.61 kg/m  BP Readings from Last 3 Encounters:  08/31/23 98/68  08/19/23 124/71  06/18/23 (!) 130/107   Wt Readings from Last 3 Encounters:  08/31/23 125 lb 14.4 oz (57.1 kg)  08/19/23 129 lb (58.5 kg)  06/11/23 129 lb 12.8 oz (58.9 kg)      Physical Exam Vitals reviewed.  Constitutional:      General: He is not in acute distress.    Appearance: He is not ill-appearing.  Cardiovascular:     Rate and Rhythm: Normal rate and regular rhythm.     Heart sounds: No murmur heard. Pulmonary:     Effort: Pulmonary effort is normal.     Breath sounds: No wheezing or rales.  Neurological:     General: No focal deficit present.     Mental Status: He is alert and oriented to person, place, and time.     Cranial Nerves: No cranial nerve deficit.     Motor: No weakness.      No results found for any visits on 08/31/23.    The ASCVD Risk score (Arnett DK, et al., 2019) failed to calculate for  the following reasons:   The 2019 ASCVD risk score is only valid for ages 20 to 50    Assessment & Plan:   #1 seizure disorder treated with Keppra .  Patient may need to be establishing with new neurologist.  He states he was dismissed from previous practice but he is checking to see if there is any way to get back in.  If not we will place referral to another group.    #2 history recurrent depression.  He has some chronic insomnia.  Recently refilled mirtazapine  15 mg nightly which he has not yet started on.  Follow-up immediately for any recurrent suicidal ideation or other concerns  Kendrew Paci,  MD

## 2023-09-03 ENCOUNTER — Other Ambulatory Visit: Payer: Self-pay

## 2023-09-03 ENCOUNTER — Emergency Department (HOSPITAL_COMMUNITY): Payer: BC Managed Care – PPO

## 2023-09-03 ENCOUNTER — Encounter (HOSPITAL_COMMUNITY): Payer: Self-pay | Admitting: *Deleted

## 2023-09-03 ENCOUNTER — Emergency Department (HOSPITAL_COMMUNITY)
Admission: EM | Admit: 2023-09-03 | Discharge: 2023-09-04 | Disposition: A | Discharge status: Home / Self Care | Payer: BC Managed Care – PPO | Attending: Emergency Medicine | Admitting: Emergency Medicine

## 2023-09-03 DIAGNOSIS — M25561 Pain in right knee: Secondary | ICD-10-CM | POA: Diagnosis not present

## 2023-09-03 DIAGNOSIS — M7989 Other specified soft tissue disorders: Secondary | ICD-10-CM | POA: Diagnosis not present

## 2023-09-03 DIAGNOSIS — M25461 Effusion, right knee: Secondary | ICD-10-CM | POA: Diagnosis not present

## 2023-09-03 NOTE — ED Provider Triage Note (Signed)
 Emergency Medicine Provider Triage Evaluation Note  Terrence Riley , a 23 y.o. male  was evaluated in triage.  Pt complains of R knee pain.  Review of Systems  Positive: Pain, swelling Negative: Fever, redness, chills, injury  Physical Exam  BP (!) 115/55   Pulse 67   Temp 97.7 F (36.5 C)   Resp 16   Ht 6' 1 (1.854 m)   Wt 57.1 kg   SpO2 97%   BMI 16.61 kg/m  Gen:   Awake, no distress   Resp:  Normal effort  MSK:   Moves extremities without difficulty  Other:    Medical Decision Making  Medically screening exam initiated at 8:31 PM.  Appropriate orders placed.  Terrence Riley was informed that the remainder of the evaluation will be completed by another provider, this initial triage assessment does not replace that evaluation, and the importance of remaining in the ED until their evaluation is complete.  Imaging ordered   Terrence Riley 09/03/23 2032

## 2023-09-03 NOTE — ED Triage Notes (Signed)
 The pt is c/o pain in his rt knee since Thursday no known injury

## 2023-09-04 DIAGNOSIS — S80911A Unspecified superficial injury of right knee, initial encounter: Secondary | ICD-10-CM | POA: Diagnosis not present

## 2023-09-04 MED ORDER — IBUPROFEN 800 MG PO TABS: 800.0000 mg | ORAL_TABLET | Freq: Three times a day (TID) | ORAL | 0 refills | Status: AC

## 2023-09-04 NOTE — Discharge Instructions (Addendum)
 Take the prescribed medication as directed. Follow-up with Dr. Rozelle Corning-- call his office on Monday to get appt scheduled. Return to the ED for new or worsening symptoms.

## 2023-09-04 NOTE — ED Provider Notes (Signed)
 [place] [facility] Provider Note   CSN: [id number] Arrival date & time: 09/03/23  1743     History  Chief Complaint  Patient presents with   Knee Pain    Terrence Riley is a 23 y.o. male.  The history is provided by the patient and medical records.  Knee Pain  23 year old male presenting to the ED with right knee pain.  This began on Thursday.  There was no preceding injury, trauma, or fall.  States has a lot of pain when bending his knee.  He is still able to ambulate.  States swelling has gone down since Thursday but still present.  He denies any fever or chills.  He is not having any pain elsewhere.  He is established with orthopedics, Dr. Addie.  Home Medications Prior to Admission medications   Medication Sig Start Date End Date Taking? Authorizing Provider  ibuprofen  (ADVIL ) 800 MG tablet Take 1 tablet (800 mg total) by mouth 3 (three) times daily. 09/04/23  Yes Jarold Olam HERO, PA-C  clonazePAM  (KLONOPIN ) 0.5 MG tablet TAKE 1 TABLET BY MOUTH EVERY DAY AT NIGHT 04/14/23   Georjean Darice HERO, MD  EPINEPHrine  0.3 mg/0.3 mL IJ SOAJ injection Inject 0.3 mg into the muscle as needed for anaphylaxis. 07/10/20   McDonald, Mia A, PA-C  KEPPRA  1000 MG tablet TAKE 1 TABLET IN AM, 2 TABLETS IN PM 06/17/23   Georjean Darice HERO, MD  mirtazapine  (REMERON ) 15 MG tablet TAKE 1 TABLET BY MOUTH EVERYDAY AT BEDTIME 08/30/23   Burchette, Wolm ORN, MD  naproxen  (NAPROSYN ) 500 MG tablet Take 1 tablet (500 mg total) by mouth 2 (two) times daily with a meal. Patient taking differently: Take 500 mg by mouth daily as needed for moderate pain (pain score 4-6). 03/31/23   Burchette, Wolm ORN, MD  nortriptyline  (PAMELOR ) 10 MG capsule TAKE 1 CAPSULE BY MOUTH AT BEDTIME. 04/26/23   Burchette, Wolm ORN, MD  sildenafil  (VIAGRA ) 100 MG tablet Take one half to one tablet daily as needed for ED 02/10/23   Micheal Wolm ORN, MD      Allergies    Lortab [hydrocodone-acetaminophen ], Other,  Zofran  [ondansetron  hcl], Citrus, Dairy aid [tilactase], Influenza virus vaccine, and Soy allergy (obsolete)    Review of Systems   Review of Systems  Musculoskeletal:  Positive for arthralgias.  All other systems reviewed and are negative.   Physical Exam Updated Vital Signs BP 125/86 (BP Location: Right Arm)   Pulse 61   Temp 97.9 F (36.6 C) (Oral)   Resp 17   Ht 6' 1 (1.854 m)   Wt 57.1 kg   SpO2 99%   BMI 16.61 kg/m   Physical Exam Vitals and nursing note reviewed.  Constitutional:      Appearance: He is well-developed.  HENT:     Head: Normocephalic and atraumatic.  Eyes:     Conjunctiva/sclera: Conjunctivae normal.     Pupils: Pupils are equal, round, and reactive to light.  Cardiovascular:     Rate and Rhythm: Normal rate and regular rhythm.     Heart sounds: Normal heart sounds.  Pulmonary:     Effort: Pulmonary effort is normal.     Breath sounds: Normal breath sounds.  Musculoskeletal:        General: Normal range of motion.     Cervical back: Normal range of motion.     Comments: Right knee is mildly swollen, mostly along infrapatellar region, there is no significant ligamentous  laxity, no bony deformity, able to plantar and dorsiflex against resistance, normal sensation, remains ambulatory  Skin:    General: Skin is warm and dry.  Neurological:     Mental Status: He is alert and oriented to person, place, and time.     ED Results / Procedures / Treatments   Labs (all labs ordered are listed, but only abnormal results are displayed) Labs Reviewed - No data to display  EKG None  Radiology DG Knee Complete 4 Views Right Result Date: 09/03/2023 CLINICAL DATA:  Right knee pain and swelling EXAM: RIGHT KNEE - COMPLETE 4+ VIEW COMPARISON:  None Available. FINDINGS: Frontal, bilateral oblique, and lateral views of the right knee are obtained. No fracture, subluxation, or dislocation. Joint spaces are well preserved. No joint effusion. Soft tissues are  unremarkable. IMPRESSION: 1. Unremarkable right knee. Electronically Signed   By: Ozell Daring M.D.   On: 09/03/2023 21:01    Procedures Procedures    Medications Ordered in ED Medications - No data to display  ED Course/ Medical Decision Making/ A&P                                 Medical Decision Making Risk Prescription drug management.   23 year old male here with atraumatic right knee pain and swelling.  Swelling is gone down initially since onset.  Mostly has pain with bending the knee.  He remains ambulatory here.  There is no bony deformity or significant ligamentous laxity.  His leg is neurovascular intact.  X-rays negative for any acute bony findings.  Patient placed in knee sleeve, will start anti-inflammatories.  He is established with orthopedics, Dr. Addie-- will call office Monday for appt.  Return here for new concerns.  Final Clinical Impression(s) / ED Diagnoses Final diagnoses:  Acute pain of right knee    Rx / DC Orders ED Discharge Orders          Ordered    ibuprofen  (ADVIL ) 800 MG tablet  3 times daily        09/04/23 0406              Jarold Olam HERO, PA-C 09/04/23 0424    Trine Raynell Moder, MD 09/05/23 [phone]

## 2023-09-11 ENCOUNTER — Encounter (HOSPITAL_COMMUNITY): Payer: Self-pay

## 2023-09-11 ENCOUNTER — Emergency Department (HOSPITAL_COMMUNITY)
Admission: EM | Admit: 2023-09-11 | Discharge: 2023-09-11 | Discharge status: Against Medical Advice | Payer: BC Managed Care – PPO | Attending: Emergency Medicine | Admitting: Emergency Medicine

## 2023-09-11 DIAGNOSIS — Z5321 Procedure and treatment not carried out due to patient leaving prior to being seen by health care provider: Secondary | ICD-10-CM | POA: Insufficient documentation

## 2023-09-11 DIAGNOSIS — R079 Chest pain, unspecified: Secondary | ICD-10-CM | POA: Diagnosis not present

## 2023-09-11 LAB — BASIC METABOLIC PANEL
Anion gap: 11 (ref 5–15)
BUN: 11 mg/dL (ref 6–20)
CO2: 25 mmol/L (ref 22–32)
Calcium: 9.3 mg/dL (ref 8.9–10.3)
Chloride: 102 mmol/L (ref 98–111)
Creatinine, Ser: 1.03 mg/dL (ref 0.61–1.24)
GFR, Estimated: >60 mL/min (ref >60)
Glucose, Bld: 88 mg/dL (ref 70–99)
Potassium: 3.8 mmol/L (ref 3.5–5.1)
Sodium: 138 mmol/L (ref 135–145)

## 2023-09-11 LAB — CBC
HCT: 40.5 % (ref 39.0–52.0)
Hemoglobin: 13.9 g/dL (ref 13.0–17.0)
MCH: 29.5 pg (ref 26.0–34.0)
MCHC: 34.3 g/dL (ref 30.0–36.0)
MCV: 86 fL (ref 80.0–100.0)
Platelets: 162 10*3/uL (ref 150–400)
RBC: 4.71 MIL/uL (ref 4.22–5.81)
RDW: 11.9 % (ref 11.5–15.5)
WBC: 8.1 10*3/uL (ref 4.0–10.5)
nRBC: 0 % (ref 0.0–0.2)

## 2023-09-11 LAB — TROPONIN I (HIGH SENSITIVITY): Troponin I (High Sensitivity): 13 ng/L (ref <18)

## 2023-09-11 NOTE — ED Triage Notes (Signed)
Pt states that he has CP that started about an hour ago, denies any other cardiac symptoms.

## 2023-09-11 NOTE — ED Notes (Signed)
Pt stated he may leave due to wait, willing to stay to finish triage

## 2023-09-11 NOTE — ED Notes (Signed)
 Pt leaving the ER AMA

## 2023-09-15 ENCOUNTER — Telehealth: Payer: Self-pay | Admitting: Neurology

## 2023-09-15 NOTE — Telephone Encounter (Signed)
Patients mother sent MyChart message regarding her sons discharge from the practice due to no-shows. Called the patient today @ 8:32 am and left a voicemail message for patient to call our office and speak with Herbert Seta, Dr. Rosalyn Gess nurse or myself. Dr. Karel Jarvis has agreed to see the patient however if patient no-show's (1) more appointment the patient will be discharged with no opportunity to reestablish care with our office.

## 2023-09-17 ENCOUNTER — Telehealth: Payer: Self-pay

## 2023-09-17 NOTE — Telephone Encounter (Signed)
Pt called an was informed Dr. Karel Jarvis has agreed to see the patient however if patient no-show's (1) more appointment the patient will be discharged with no opportunity to reestablish care with our office. Pt verbalized understanding, the front office will call to get pt scheduled,

## 2023-09-18 ENCOUNTER — Other Ambulatory Visit: Payer: Self-pay

## 2023-09-18 ENCOUNTER — Emergency Department (HOSPITAL_COMMUNITY): Payer: BC Managed Care – PPO

## 2023-09-18 ENCOUNTER — Encounter (HOSPITAL_COMMUNITY): Payer: Self-pay

## 2023-09-18 ENCOUNTER — Emergency Department (HOSPITAL_COMMUNITY)
Admission: EM | Admit: 2023-09-18 | Discharge: 2023-09-18 | Discharge status: Against Medical Advice | Payer: BC Managed Care – PPO | Attending: Emergency Medicine | Admitting: Emergency Medicine

## 2023-09-18 DIAGNOSIS — Z5321 Procedure and treatment not carried out due to patient leaving prior to being seen by health care provider: Secondary | ICD-10-CM | POA: Diagnosis not present

## 2023-09-18 DIAGNOSIS — R079 Chest pain, unspecified: Secondary | ICD-10-CM | POA: Diagnosis not present

## 2023-09-18 LAB — CBC
HCT: 39.8 % (ref 39.0–52.0)
Hemoglobin: 13.6 g/dL (ref 13.0–17.0)
MCH: 29.4 pg (ref 26.0–34.0)
MCHC: 34.2 g/dL (ref 30.0–36.0)
MCV: 86.1 fL (ref 80.0–100.0)
Platelets: 179 10*3/uL (ref 150–400)
RBC: 4.62 MIL/uL (ref 4.22–5.81)
RDW: 12.1 % (ref 11.5–15.5)
WBC: 10.1 10*3/uL (ref 4.0–10.5)
nRBC: 0 % (ref 0.0–0.2)

## 2023-09-18 LAB — TROPONIN I (HIGH SENSITIVITY): Troponin I (High Sensitivity): 13 ng/L (ref <18)

## 2023-09-18 LAB — BASIC METABOLIC PANEL
Anion gap: 13 (ref 5–15)
BUN: 7 mg/dL (ref 6–20)
CO2: 26 mmol/L (ref 22–32)
Calcium: 9.7 mg/dL (ref 8.9–10.3)
Chloride: 101 mmol/L (ref 98–111)
Creatinine, Ser: 1.18 mg/dL (ref 0.61–1.24)
GFR, Estimated: >60 mL/min (ref >60)
Glucose, Bld: 85 mg/dL (ref 70–99)
Potassium: 4.1 mmol/L (ref 3.5–5.1)
Sodium: 140 mmol/L (ref 135–145)

## 2023-09-18 LAB — RESP PANEL BY RT-PCR (RSV, FLU A&B, COVID)  RVPGX2
Influenza A by PCR: NEGATIVE
Influenza B by PCR: NEGATIVE
Resp Syncytial Virus by PCR: NEGATIVE
SARS Coronavirus 2 by RT PCR: NEGATIVE

## 2023-09-18 NOTE — ED Triage Notes (Signed)
 Pt reports CP/SOB starting at 2300 after leaving gym 10/10 Rt side/center (non-radiating). Associated SOB. VSS. No acute distress noted. 18G L AC. Pt reports allergy to aspirin. Hx of same chest pain with no cardiac diagnosis.

## 2023-09-18 NOTE — ED Notes (Signed)
 Pt said he was going home. IV removed and intact.

## 2023-09-18 NOTE — ED Provider Triage Note (Signed)
 Emergency Medicine Provider Triage Evaluation Note  Terrence Riley , a 23 y.o. male  was evaluated in triage.  Pt complains of central chest pressure that started after he left the gym tonight. Hx of same, no cardiac hx.   Review of Systems  Positive: CP, chest tightness Negative: syncope  Physical Exam  BP 120/85 (BP Location: Left Arm)   Pulse 83   Temp 98.6 F (37 C) (Oral)   Resp 16   Ht 6\' 1"  (1.854 m)   Wt 56.7 kg   SpO2 100%   BMI 16.49 kg/m  Gen:   Awake, no distress   Resp:  Normal effort  MSK:   Moves extremities without difficulty  Other:  RRR no m/r/g, lungs CTAB  Medical Decision Making  Medically screening exam initiated at 2:21 AM.  Appropriate orders placed.  [name] WILLIAN DONSON was informed that the remainder of the evaluation will be completed by another provider, this initial triage assessment does not replace that evaluation, and the importance of remaining in the ED until their evaluation is complete.  This chart was dictated using voice recognition software, Dragon. Despite the best efforts of this provider to proofread and correct errors, errors may still occur which can change documentation meaning.    Paris Lore, PA-C 09/18/23 0222

## 2023-09-20 ENCOUNTER — Ambulatory Visit: Payer: BC Managed Care – PPO | Admitting: Orthopedic Surgery

## 2023-10-11 ENCOUNTER — Other Ambulatory Visit (INDEPENDENT_AMBULATORY_CARE_PROVIDER_SITE_OTHER): Payer: Self-pay

## 2023-10-11 ENCOUNTER — Encounter: Payer: Self-pay | Admitting: Orthopedic Surgery

## 2023-10-11 ENCOUNTER — Ambulatory Visit: Admitting: Orthopedic Surgery

## 2023-10-11 DIAGNOSIS — M25562 Pain in left knee: Secondary | ICD-10-CM

## 2023-10-11 DIAGNOSIS — M25561 Pain in right knee: Secondary | ICD-10-CM

## 2023-10-11 NOTE — Progress Notes (Unsigned)
 Office Visit Note   Patient: Terrence Riley           Date of Birth: 11-Apr-2001           MRN: [id number] Visit Date: 10/11/2023 Requested by: Kristian Covey, MD [address] [place],  [place] [address] PCP: Kristian Covey, MD  Subjective: Chief Complaint  Patient presents with  . Right Shoulder - Pain  . Left Knee - Pain  . Right Knee - Pain    HPI: Terrence Riley is a 23 y.o. male who presents to the office reporting ***.                ROS: All systems reviewed are negative as they relate to the chief complaint within the history of present illness.  Patient denies fevers or chills.  Assessment & Plan: Visit Diagnoses:  1. Pain in both knees, unspecified chronicity     Plan: ***  Follow-Up Instructions: No follow-ups on file.   Orders:  Orders Placed This Encounter  Procedures  . XR KNEE 3 VIEW RIGHT  . XR Knee 1-2 Views Left   No orders of the defined types were placed in this encounter.     Procedures: No procedures performed   Clinical Data: No additional findings.  Objective: Vital Signs: There were no vitals taken for this visit.  Physical Exam:  Constitutional: Patient appears well-developed HEENT:  Head: Normocephalic Eyes:EOM are normal Neck: Normal range of motion Cardiovascular: Normal rate Pulmonary/chest: Effort normal Neurologic: Patient is alert Skin: Skin is warm Psychiatric: Patient has normal mood and affect  Ortho Exam: ***  Specialty Comments:  No specialty comments available.  Imaging: No results found.   PMFS History: Patient Active Problem List   Diagnosis Date Noted  . Traumatic complete tear of left rotator cuff 09/18/2022  . Heterotopic ossification of bone 09/18/2022  . Hypokalemia 06/26/2021  . B12 deficiency 06/26/2021  . Rhabdomyolysis 06/26/2021  . Acute encephalopathy 06/20/2021  . Aspiration pneumonia (HCC) 06/20/2021  . Febrile illness   . Shoulder instability, right   . Humeral  head fracture, right, with malunion, subsequent encounter   . Esophagitis determined by endoscopy 07/24/2020  . Degenerative tear of glenoid labrum of right shoulder 02/13/2020  . Shoulder dislocation, left, subsequent encounter 02/13/2020  . Seizure disorder (HCC) 12/22/2019  . Migraines 12/22/2019  . Mild intermittent asthma 12/22/2019  . Migraine without aura and without status migrainosus, not intractable 06/29/2019  . 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 Altered mental status 03/12/2014  . Lethargic 03/09/2014  . Environmental allergies 03/01/2014  . Gastroesophageal reflux disease with esophagitis 10/06/2013  . Localization-related (focal) (partial) symptomatic epilepsy and epileptic syndromes with complex partial seizures, not intractable, without status epilepticus (HCC) 10/06/2013  . Spells 07/26/2013  . Allergy to other foods 07/13/2013  . Prophylactic immunotherapy 07/13/2013  . Unspecified asthma, uncomplicated 07/13/2013  . Eosinophilic esophagitis 01/23/2013  . Gastroesophageal reflux 01/23/2013  . Vomiting 01/23/2013  . Leukopenia 10/19/2012  . Muscle jerks during sleep 10/19/2012  . Constipation 08/20/2012  . Neutropenia (HCC) 08/20/2012  . Allergic rhinitis due to pollen 02/19/2011   Past Medical History:  Diagnosis Date  . Allergy    rhinitis  . Asthma    as a child  . Complication of anesthesia    pt. reports he need more anesthesia with the septoplasty surgery  . Eosinophilic esophagitis    heartburn and reflux  . Family history of adverse reaction to anesthesia  mother respiratory failure  . Headache   . Nasal fracture    deviated septum  . Pneumonia    1x history of  . Premature baby    2 months early  . Seizures (HCC)    well controlled on meds/ one 2 months ago when meds were late 11/2021    Family History  Problem Relation Age of Onset  . Cancer Mother        breat  . Clotting disorder Mother   . Protein C deficiency Sister   . Stomach  cancer Neg Hx   . Esophageal cancer Neg Hx   . Colon cancer Neg Hx     Past Surgical History:  Procedure Laterality Date  . ADENOIDECTOMY    . CLOSED REDUCTION NASAL FRACTURE N/A 08/31/2017   Procedure: CLOSED REDUCTION NASAL FRACTURE;  Surgeon: Geanie Logan, MD;  Location: [facility];  Service: ENT;  Laterality: N/A;  . HARDWARE REMOVAL Right 09/17/2022   Procedure: HARDWARE REMOVAL;  Surgeon: Cammy Copa, MD;  Location: [facility] OR;  Service: Orthopedics;  Laterality: Right;  PROVIDER REQUESTING 2.0 HOURS OPERATING TIME  . ORIF HUMERUS FRACTURE Right 05/05/2021   Procedure: reverse Hill-Sachs allografting, biceps tenodesis;  Surgeon: Cammy Copa, MD;  Location: [facility] OR;  Service: Orthopedics;  Laterality: Right;  . SEPTOPLASTY N/A 08/31/2017   Procedure: SEPTOPLASTY;  Surgeon: Geanie Logan, MD;  Location: [facility];  Service: ENT;  Laterality: N/A;  . SHOULDER ARTHROSCOPY WITH LABRAL REPAIR Left 04/30/2020   Procedure: LEFT SHOULDER POSTERIOR LABRAL REPAIR WITH ARTHROSCOPY, BICEPS TENDON RELEASE AND TENODESIS, OPEN ALLOGRAFT FOR REVERSE BANKART LESION;  Surgeon: Cammy Copa, MD;  Location: [facility] OR;  Service: Orthopedics;  Laterality: Left;  . SHOULDER ARTHROSCOPY WITH LABRAL REPAIR Right 05/05/2021   Procedure: right shoulder arthroscopy, biceps release, posterior labral repair;;  Surgeon: Cammy Copa, MD;  Location: [facility] OR;  Service: Orthopedics;  Laterality: Right;  . SHOULDER ARTHROSCOPY WITH SUBACROMIAL DECOMPRESSION AND BICEP TENDON REPAIR Right 09/17/2022   Procedure: RIGHT SHOULDER ARTHROSCOPY, SUBSCAPULARIS REPAIR;  Surgeon: Cammy Copa, MD;  Location: [facility] OR;  Service: Orthopedics;  Laterality: Right;  . TONSILLECTOMY     and addenoids  . UPPER GASTROINTESTINAL ENDOSCOPY  05/15/2022   Social History   Occupational History  . 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 Smoking status: Never  . Smokeless tobacco: Never  Vaping  Use  . Vaping status: Never Used  Substance and Sexual Activity  . Alcohol use: Never  . Drug use: Never  . Sexual activity: Never

## 2023-10-11 NOTE — Progress Notes (Unsigned)
 Office Visit Note   Patient: Terrence Riley           Date of Birth: 2001/02/21           MRN: [id number] Visit Date: 10/11/2023 Requested by: Kristian Covey, MD [address] [place],  [place] [address] PCP: Kristian Covey, MD  Subjective: Chief Complaint  Patient presents with  . Right Shoulder - Pain  . Left Knee - Pain  . Right Knee - Pain    HPI: Terrence Riley is a 23 y.o. male who presents to the office reporting ***.                ROS: All systems reviewed are negative as they relate to the chief complaint within the history of present illness.  Patient denies fevers or chills.  Assessment & Plan: Visit Diagnoses:  1. Pain in both knees, unspecified chronicity     Plan: ***  Follow-Up Instructions: No follow-ups on file.   Orders:  Orders Placed This Encounter  Procedures  . XR KNEE 3 VIEW RIGHT  . XR Knee 1-2 Views Left   No orders of the defined types were placed in this encounter.     Procedures: No procedures performed   Clinical Data: No additional findings.  Objective: Vital Signs: There were no vitals taken for this visit.  Physical Exam:  Constitutional: Patient appears well-developed HEENT:  Head: Normocephalic Eyes:EOM are normal Neck: Normal range of motion Cardiovascular: Normal rate Pulmonary/chest: Effort normal Neurologic: Patient is alert Skin: Skin is warm Psychiatric: Patient has normal mood and affect  Ortho Exam: ***  Specialty Comments:  No specialty comments available.  Imaging: XR KNEE 3 VIEW RIGHT Result Date: 10/11/2023 AP lateral merchant radiographs of the right knee are reviewed.  No acute fracture.  No dislocation.  Alignment slight varus.  No significant degenerative changes in the medial lateral or patellofemoral compartments.   XR Knee 1-2 Views Left Result Date: 10/11/2023 AP lateral and merchant radiographs of the left knee are reviewed.  No acute fracture.  No dislocation.  .  No  significant degenerative changes in the medial lateral or patellofemoral compartments.  Slight varus alignment.    PMFS History: Patient Active Problem List   Diagnosis Date Noted  . Traumatic complete tear of left rotator cuff 09/18/2022  . Heterotopic ossification of bone 09/18/2022  . Hypokalemia 06/26/2021  . B12 deficiency 06/26/2021  . Rhabdomyolysis 06/26/2021  . Acute encephalopathy 06/20/2021  . Aspiration pneumonia (HCC) 06/20/2021  . Febrile illness   . Shoulder instability, right   . Humeral head fracture, right, with malunion, subsequent encounter   . Esophagitis determined by endoscopy 07/24/2020  . Degenerative tear of glenoid labrum of right shoulder 02/13/2020  . Shoulder dislocation, left, subsequent encounter 02/13/2020  . Seizure disorder (HCC) 12/22/2019  . Migraines 12/22/2019  . Mild intermittent asthma 12/22/2019  . Migraine without aura and without status migrainosus, not intractable 06/29/2019  . 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 Altered mental status 03/12/2014  . Lethargic 03/09/2014  . Environmental allergies 03/01/2014  . Gastroesophageal reflux disease with esophagitis 10/06/2013  . Localization-related (focal) (partial) symptomatic epilepsy and epileptic syndromes with complex partial seizures, not intractable, without status epilepticus (HCC) 10/06/2013  . Spells 07/26/2013  . Allergy to other foods 07/13/2013  . Prophylactic immunotherapy 07/13/2013  . Unspecified asthma, uncomplicated 07/13/2013  . Eosinophilic esophagitis 01/23/2013  . Gastroesophageal reflux 01/23/2013  . Vomiting 01/23/2013  . Leukopenia 10/19/2012  . Muscle  jerks during sleep 10/19/2012  . Constipation 08/20/2012  . Neutropenia (HCC) 08/20/2012  . Allergic rhinitis due to pollen 02/19/2011   Past Medical History:  Diagnosis Date  . Allergy    rhinitis  . Asthma    as a child  . Complication of anesthesia    pt. reports he need more anesthesia with the septoplasty  surgery  . Eosinophilic esophagitis    heartburn and reflux  . Family history of adverse reaction to anesthesia    mother respiratory failure  . Headache   . Nasal fracture    deviated septum  . Pneumonia    1x history of  . Premature baby    2 months early  . Seizures (HCC)    well controlled on meds/ one 2 months ago when meds were late 11/2021    Family History  Problem Relation Age of Onset  . Cancer Mother        breat  . Clotting disorder Mother   . Protein C deficiency Sister   . Stomach cancer Neg Hx   . Esophageal cancer Neg Hx   . Colon cancer Neg Hx     Past Surgical History:  Procedure Laterality Date  . ADENOIDECTOMY    . CLOSED REDUCTION NASAL FRACTURE N/A 08/31/2017   Procedure: CLOSED REDUCTION NASAL FRACTURE;  Surgeon: Geanie Logan, MD;  Location[facility];  Service: ENT;  Laterality: N/A;  . HARDWARE REMOVAL Right 09/17/2022   Procedure: HARDWARE REMOVAL;  Surgeon: Cammy Copa, MD;  Location: [facility] OR;  Service: Orthopedics;  Laterality: Right;  PROVIDER REQUESTING 2.0 HOURS OPERATING TIME  . ORIF HUMERUS FRACTURE Right 05/05/2021   Procedure: reverse Hill-Sachs allografting, biceps tenodesis;  Surgeon: Cammy Copa, MD;  Location: [facility] OR;  Service: Orthopedics;  Laterality: Right;  . SEPTOPLASTY N/A 08/31/2017   Procedure: SEPTOPLASTY;  Surgeon: Geanie Logan, MD;  Location: [facility];  Service: ENT;  Laterality: N/A;  . SHOULDER ARTHROSCOPY WITH LABRAL REPAIR Left 04/30/2020   Procedure: LEFT SHOULDER POSTERIOR LABRAL REPAIR WITH ARTHROSCOPY, BICEPS TENDON RELEASE AND TENODESIS, OPEN ALLOGRAFT FOR REVERSE BANKART LESION;  Surgeon: Cammy Copa, MD;  Location: [facility] OR;  Service: Orthopedics;  Laterality: Left;  . SHOULDER ARTHROSCOPY WITH LABRAL REPAIR Right 05/05/2021   Procedure: right shoulder arthroscopy, biceps release, posterior labral repair;;  Surgeon: Cammy Copa, MD;  Location: [facility] OR;  Service:  Orthopedics;  Laterality: Right;  . SHOULDER ARTHROSCOPY WITH SUBACROMIAL DECOMPRESSION AND BICEP TENDON REPAIR Right 09/17/2022   Procedure: RIGHT SHOULDER ARTHROSCOPY, SUBSCAPULARIS REPAIR;  Surgeon: Cammy Copa, MD;  Location: [facility] OR;  Service: Orthopedics;  Laterality: Right;  . TONSILLECTOMY     and addenoids  . UPPER GASTROINTESTINAL ENDOSCOPY  05/15/2022   Social History   Occupational History  . 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 Smoking status: Never  . Smokeless tobacco: Never  Vaping Use  . Vaping status: Never Used  Substance and Sexual Activity  . Alcohol use: Never  . Drug use: Never  . Sexual activity: Never

## 2023-10-18 ENCOUNTER — Other Ambulatory Visit: Payer: Self-pay | Admitting: Neurology

## 2023-10-21 ENCOUNTER — Other Ambulatory Visit: Payer: Self-pay | Admitting: Neurology

## 2023-10-22 ENCOUNTER — Other Ambulatory Visit: Payer: Self-pay | Admitting: Neurology

## 2023-10-22 MED ORDER — CLONAZEPAM 0.5 MG PO TABS: ORAL_TABLET | ORAL | 5 refills | Status: DC

## 2023-10-26 ENCOUNTER — Other Ambulatory Visit: Payer: Self-pay | Admitting: Neurology

## 2023-11-01 DIAGNOSIS — L700 Acne vulgaris: Secondary | ICD-10-CM | POA: Diagnosis not present

## 2023-11-05 ENCOUNTER — Other Ambulatory Visit

## 2023-11-23 ENCOUNTER — Telehealth: Payer: Self-pay | Admitting: Neurology

## 2023-11-23 NOTE — Telephone Encounter (Signed)
 PT. Needs refill of Keppra  sent to pharmacy Mail order with fax #[phone], Must be brand name

## 2023-11-24 NOTE — Telephone Encounter (Signed)
 Pt called informed that we needed to know the name of the pharmacy he is going to call his mama and call back with the name

## 2023-11-24 NOTE — Telephone Encounter (Signed)
 PT. Called but did not leave name of pharmacy to send Rx, asking for you to give him a call back

## 2023-11-24 NOTE — Telephone Encounter (Signed)
 Pt called left a voice mail to call back the fax number he gave us  is not pulling up a pharmacy I need a  name of a pharmacy to send his medication.

## 2023-11-29 ENCOUNTER — Ambulatory Visit: Admitting: Family Medicine

## 2023-11-29 MED ORDER — LEVETIRACETAM 1000 MG PO TABS: ORAL_TABLET | ORAL | 3 refills | Status: DC

## 2023-11-29 NOTE — Telephone Encounter (Signed)
 Pt called and LM. He said the pharmacy is [facility]. He would like a call back

## 2023-11-29 NOTE — Telephone Encounter (Signed)
 Pt called no answer left a voice mail that [facility]

## 2023-11-30 ENCOUNTER — Ambulatory Visit: Admitting: Family Medicine

## 2023-11-30 ENCOUNTER — Encounter: Payer: Self-pay | Admitting: Family Medicine

## 2023-11-30 VITALS — BP 98/66 | HR 52 | Temp 98.0°F | Ht 73.0 in | Wt 133.0 lb

## 2023-11-30 DIAGNOSIS — Z113 Encounter for screening for infections with a predominantly sexual mode of transmission: Secondary | ICD-10-CM

## 2023-11-30 DIAGNOSIS — N489 Disorder of penis, unspecified: Secondary | ICD-10-CM

## 2023-11-30 MED ORDER — SILDENAFIL CITRATE 100 MG PO TABS: ORAL_TABLET | ORAL | 11 refills | Status: AC

## 2023-11-30 NOTE — Progress Notes (Signed)
 Established Patient Office Visit  Subjective   Patient ID: Terrence Riley, male    DOB: 2000-09-07  Age: 23 y.o. MRN: [id number]  No chief complaint on file.   HPI   Terrence Riley is seen today with penile lesion distal penile shaft ventral surface.  He states that several years ago he had a tick bite in this area went to the ER and this was removed.  He feels like this was removed in entirety.  He had little bit of irritation since then.  He had a new sexual partner about 3 months ago.  Wanted to make sure this was not some type of STD.  Denies any dysuria.  No penile discharge.  No other skin lesions noted.  Last intercourse was a few months ago.  No history of herpes.  No recent vesicular lesions.  Current area of irritation is nonpruritic and nonpainful.  No history of prior HPV vaccine.  Has had hepatitis B vaccinations.  Past Medical History:  Diagnosis Date   Allergy    rhinitis   Asthma    as a child   Complication of anesthesia    pt. reports he need more anesthesia with the septoplasty surgery   Eosinophilic esophagitis    heartburn and reflux   Family history of adverse reaction to anesthesia    mother respiratory failure   Headache    Nasal fracture    deviated septum   Pneumonia    1x history of   Premature baby    2 months early   Seizures (HCC)    well controlled on meds/ one 2 months ago when meds were late 11/2021   Past Surgical History:  Procedure Laterality Date   ADENOIDECTOMY     CLOSED REDUCTION NASAL FRACTURE N/A 08/31/2017   Procedure: CLOSED REDUCTION NASAL FRACTURE;  Surgeon: Von Grumbling, MD;  Location: [facility];  Service: ENT;  Laterality: N/A;   HARDWARE REMOVAL Right 09/17/2022   Procedure: HARDWARE REMOVAL;  Surgeon: Jasmine Mesi, MD;  Location: [facility] OR;  Service: Orthopedics;  Laterality: Right;  PROVIDER REQUESTING 2.0 HOURS OPERATING TIME   ORIF HUMERUS FRACTURE Right 05/05/2021   Procedure: reverse Hill-Sachs  allografting, biceps tenodesis;  Surgeon: Jasmine Mesi, MD;  Location: [facility] OR;  Service: Orthopedics;  Laterality: Right;   SEPTOPLASTY N/A 08/31/2017   Procedure: SEPTOPLASTY;  Surgeon: Von Grumbling, MD;  Location: [facility];  Service: ENT;  Laterality: N/A;   SHOULDER ARTHROSCOPY WITH LABRAL REPAIR Left 04/30/2020   Procedure: LEFT SHOULDER POSTERIOR LABRAL REPAIR WITH ARTHROSCOPY, BICEPS TENDON RELEASE AND TENODESIS, OPEN ALLOGRAFT FOR REVERSE BANKART LESION;  Surgeon: Jasmine Mesi, MD;  Location: [facility] OR;  Service: Orthopedics;  Laterality: Left;   SHOULDER ARTHROSCOPY WITH LABRAL REPAIR Right 05/05/2021   Procedure: right shoulder arthroscopy, biceps release, posterior labral repair;;  Surgeon: Jasmine Mesi, MD;  Location: [facility] OR;  Service: Orthopedics;  Laterality: Right;   SHOULDER ARTHROSCOPY WITH SUBACROMIAL DECOMPRESSION AND BICEP TENDON REPAIR Right 09/17/2022   Procedure: RIGHT SHOULDER ARTHROSCOPY, SUBSCAPULARIS REPAIR;  Surgeon: Jasmine Mesi, MD;  Location: [facility] OR;  Service: Orthopedics;  Laterality: Right;   TONSILLECTOMY     and addenoids   UPPER GASTROINTESTINAL ENDOSCOPY  05/15/2022    reports that he has never smoked. He has never used smokeless tobacco. He reports that he does not drink alcohol and does not use drugs. family history includes Cancer in his mother; Clotting disorder in his mother; Protein C deficiency in  his sister. Allergies  Allergen Reactions   Lortab [Hydrocodone-Acetaminophen ] Hives   Other Anaphylaxis    Peanuts   Zofran  [Ondansetron  Hcl] Nausea And Vomiting   Citrus Other (See Comments)    Sores in mouth   Dairy Aid [Tilactase] Nausea And Vomiting   Influenza Virus Vaccine Other (See Comments)    Partial paralysis for a couple of weeks per mom   Soy Allergy (Obsolete) Other (See Comments)    Seizures     Review of Systems  Constitutional:  Negative for chills and fever.  Genitourinary:  Negative for dysuria.       Objective:     BP 98/66 (BP Location: Left Arm, Patient Position: Sitting, Cuff Size: Normal)   Pulse (!) 52   Temp 98 F (36.7 C) (Oral)   Ht 6\' 1"  (1.854 m)   Wt 133 lb (60.3 kg)   SpO2 95%   BMI 17.55 kg/m  BP Readings from Last 3 Encounters:  11/30/23 98/66  09/18/23 120/85  09/11/23 137/74   Wt Readings from Last 3 Encounters:  11/30/23 133 lb (60.3 kg)  09/18/23 125 lb (56.7 kg)  09/03/23 125 lb 14.1 oz (57.1 kg)      Physical Exam Vitals reviewed.  Constitutional:      General: He is not in acute distress.    Appearance: He is not ill-appearing.  Cardiovascular:     Rate and Rhythm: Normal rate and regular rhythm.  Genitourinary:    Comments: Patient has 1 area approximately 2 x 3 mm ventral aspect of the distal shaft of the penis of somewhat lichenified skin.  Nontender.  No vesicles.  No pustules. Neurological:     Mental Status: He is alert.      No results found for any visits on 11/30/23.    The ASCVD Risk score (Arnett DK, et al., 2019) failed to calculate for the following reasons:   The 2019 ASCVD risk score is only valid for ages 30 to 20    Assessment & Plan:   Problem List Items Addressed This Visit   None Visit Diagnoses       Screen for STD (sexually transmitted disease)    -  Primary   Relevant Orders   HIV antibody (with reflex)   RPR     Area of chronic skin irritation distal shaft of penis ventral surface.  Avoid scratching and irritation as much as possible.  Check HIV and RPR.  We also discussed possible HPV vaccine and he will consider.  Handout given. Handout given on STD prevention. Urology referral if he has any growth of area above or ulceration  No follow-ups on file.    Glean Lamy, MD

## 2023-12-01 LAB — RPR: RPR Ser Ql: NONREACTIVE

## 2023-12-01 LAB — HIV ANTIBODY (ROUTINE TESTING W REFLEX): HIV 1&2 Ab, 4th Generation: NONREACTIVE

## 2023-12-06 ENCOUNTER — Other Ambulatory Visit

## 2023-12-11 ENCOUNTER — Other Ambulatory Visit

## 2023-12-23 ENCOUNTER — Other Ambulatory Visit (INDEPENDENT_AMBULATORY_CARE_PROVIDER_SITE_OTHER): Payer: Self-pay

## 2023-12-23 ENCOUNTER — Encounter: Payer: Self-pay | Admitting: Orthopedic Surgery

## 2023-12-23 ENCOUNTER — Ambulatory Visit: Admitting: Orthopedic Surgery

## 2023-12-23 DIAGNOSIS — M25511 Pain in right shoulder: Secondary | ICD-10-CM | POA: Diagnosis not present

## 2023-12-23 NOTE — Progress Notes (Signed)
 Office Visit Note   Patient: Terrence Riley           Date of Birth: 2000/11/21           MRN: [id number] Visit Date: 12/23/2023 Requested by: Marquetta Sit, MD [address] [place],  [place] [address] PCP: Marquetta Sit, MD  Subjective: Chief Complaint  Patient presents with   Right Shoulder - Pain    HPI: Terrence Riley is a 23 y.o. male who presents to the office reporting right shoulder pain of 6 weeks duration.  Denies any history of injury.  Reports a lot of popping.  Had prior right shoulder stabilization in October 2022.  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  He has been playing basketball.  Is having little bit more popping.  Right knee was problematic last time doing better today..                ROS: All systems reviewed are negative as they relate to the chief complaint within the history of present illness.  Patient denies fevers or chills.  Assessment & Plan: Visit Diagnoses:  1. Right shoulder pain, unspecified chronicity     Plan: Impression is right shoulder pain and popping.  His subscap strength is slightly weaker on the right compared to the left.  Ultrasound of theAnterior shoulder shows the subscap repair to appear intact.  At this time is MRI arthrogram of that right shoulder to evaluate potential mechanical source of popping related to the allograft.  The screws appear to be in good position without change.  Follow-up after that study.  Superior rotator cuff pathology also possible.  Follow-Up Instructions: No follow-ups on file.   Orders:  Orders Placed This Encounter  Procedures   XR Shoulder Right   MR SHOULDER RIGHT W CONTRAST   Arthrogram   No orders of the defined types were placed in this encounter.     Procedures: No procedures performed   Clinical Data: No additional findings.  Objective: Vital Signs:  There were no vitals taken for this visit.  Physical Exam:  Constitutional: Patient appears well-developed HEENT:  Head: Normocephalic Eyes:EOM are normal Neck: Normal range of motion Cardiovascular: Normal rate Pulmonary/chest: Effort normal Neurologic: Patient is alert Skin: Skin is warm Psychiatric: Patient has normal mood and affect  Ortho Exam: Ortho exam demonstrates good subscap strength 5 out of 5 on the left 5- out of 5 on the right.  Some abnormal biceps contour present on the right-hand side consistent with subscap rupture from prior surgery.  This is not symptomatic for the patient.  His range of motion bilaterally is 80/120/180.  External rotation strength intact.  No coarse grinding or crepitus with internal and external rotation of that right shoulder.  Specialty Comments:  No specialty comments available.  Imaging: XR Shoulder Right Result Date: 12/23/2023 AP axillary outlet radiograph right shoulder reviewed.  Shoulder is located.  Metal screw hardware fixation unchanged in appearance from prior allograft reconstruction.  Prior heterotopic ossification around the lesser tuberosity has been removed.  No acute fracture.  No arthritic changes.    PMFS History: Patient Active Problem List   Diagnosis Date Noted   Traumatic complete tear of left rotator cuff 09/18/2022   Heterotopic ossification of bone 09/18/2022   Hypokalemia 06/26/2021   B12 deficiency 06/26/2021   Rhabdomyolysis 06/26/2021   Acute encephalopathy 06/20/2021   Aspiration  pneumonia (HCC) 06/20/2021   Febrile illness    Shoulder instability, right    Humeral head fracture, right, with malunion, subsequent encounter    Esophagitis determined by endoscopy 07/24/2020   Degenerative tear of glenoid labrum of right shoulder 02/13/2020   Shoulder dislocation, left, subsequent encounter 02/13/2020   Seizure disorder (HCC) 12/22/2019   Migraines 12/22/2019   Mild intermittent asthma 12/22/2019    Migraine without aura and without status migrainosus, not intractable 06/29/2019   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   Altered mental status 03/12/2014   Lethargic 03/09/2014   Environmental allergies 03/01/2014   Gastroesophageal reflux disease with esophagitis 10/06/2013   Localization-related (focal) (partial) symptomatic epilepsy and epileptic syndromes with complex partial seizures, not intractable, without status epilepticus (HCC) 10/06/2013   Spells 07/26/2013   Allergy to other foods 07/13/2013   Prophylactic immunotherapy 07/13/2013   Unspecified asthma, uncomplicated 07/13/2013   Eosinophilic esophagitis 01/23/2013   Gastroesophageal reflux 01/23/2013   Vomiting 01/23/2013   Leukopenia 10/19/2012   Muscle jerks during sleep 10/19/2012   Constipation 08/20/2012   Neutropenia (HCC) 08/20/2012   Allergic rhinitis due to pollen 02/19/2011   Past Medical History:  Diagnosis Date   Allergy    rhinitis   Asthma    as a child   Complication of anesthesia    pt. reports he need more anesthesia with the septoplasty surgery   Eosinophilic esophagitis    heartburn and reflux   Family history of adverse reaction to anesthesia    mother respiratory failure   Headache    Nasal fracture    deviated septum   Pneumonia    1x history of   Premature baby    2 months early   Seizures (HCC)    well controlled on meds/ one 2 months ago when meds were late 11/2021    Family History  Problem Relation Age of Onset   Cancer Mother        breat   Clotting disorder Mother    Protein C deficiency Sister    Stomach cancer Neg Hx    Esophageal cancer Neg Hx    Colon cancer Neg Hx     Past Surgical History:  Procedure Laterality Date   ADENOIDECTOMY     CLOSED REDUCTION NASAL FRACTURE N/A 08/31/2017   Procedure: CLOSED REDUCTION NASAL FRACTURE;  Surgeon: Von Grumbling, MD;  Location: [facility];  Service: ENT;  Laterality: N/A;   HARDWARE REMOVAL Right 09/17/2022   Procedure:  HARDWARE REMOVAL;  Surgeon: Jasmine Mesi, MD;  Location: [facility] OR;  Service: Orthopedics;  Laterality: Right;  PROVIDER REQUESTING 2.0 HOURS OPERATING TIME   ORIF HUMERUS FRACTURE Right 05/05/2021   Procedure: reverse Hill-Sachs allografting, biceps tenodesis;  Surgeon: Jasmine Mesi, MD;  Location: [facility] OR;  Service: Orthopedics;  Laterality: Right;   SEPTOPLASTY N/A 08/31/2017   Procedure: SEPTOPLASTY;  Surgeon: Von Grumbling, MD;  Location: [facility];  Service: ENT;  Laterality: N/A;   SHOULDER ARTHROSCOPY WITH LABRAL REPAIR Left 04/30/2020   Procedure: LEFT SHOULDER POSTERIOR LABRAL REPAIR WITH ARTHROSCOPY, BICEPS TENDON RELEASE AND TENODESIS, OPEN ALLOGRAFT FOR REVERSE BANKART LESION;  Surgeon: Jasmine Mesi, MD;  Location: [facility] OR;  Service: Orthopedics;  Laterality: Left;   SHOULDER ARTHROSCOPY WITH LABRAL REPAIR Right 05/05/2021   Procedure: right shoulder arthroscopy, biceps release, posterior labral repair;;  Surgeon: Jasmine Mesi, MD;  Location: [facility] OR;  Service: Orthopedics;  Laterality: Right;   SHOULDER ARTHROSCOPY WITH SUBACROMIAL DECOMPRESSION AND BICEP TENDON REPAIR Right  09/17/2022   Procedure: RIGHT SHOULDER ARTHROSCOPY, SUBSCAPULARIS REPAIR;  Surgeon: Jasmine Mesi, MD;  Location: [facility] OR;  Service: Orthopedics;  Laterality: Right;   TONSILLECTOMY     and addenoids   UPPER GASTROINTESTINAL ENDOSCOPY  05/15/2022   Social History   Occupational History   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

## 2024-01-11 ENCOUNTER — Other Ambulatory Visit: Payer: Self-pay | Admitting: Orthopedic Surgery

## 2024-01-14 ENCOUNTER — Ambulatory Visit
Admission: RE | Admit: 2024-01-14 | Discharge: 2024-01-14 | Disposition: A | Discharge status: Home / Self Care | Source: Ambulatory Visit | Attending: Orthopedic Surgery | Admitting: Orthopedic Surgery

## 2024-01-14 DIAGNOSIS — M25511 Pain in right shoulder: Secondary | ICD-10-CM | POA: Diagnosis not present

## 2024-01-14 DIAGNOSIS — G8929 Other chronic pain: Secondary | ICD-10-CM | POA: Diagnosis not present

## 2024-01-14 MED ORDER — IOPAMIDOL (ISOVUE-M 200) INJECTION 41%
10.0000 mL | Freq: Once | INTRAMUSCULAR | Status: AC
Start: 2024-01-14 — End: 2024-01-14
  Administered 2024-01-14: 10 mL via INTRA_ARTICULAR

## 2024-01-17 ENCOUNTER — Emergency Department (HOSPITAL_COMMUNITY)
Admission: EM | Admit: 2024-01-17 | Discharge: 2024-01-17 | Discharge status: Against Medical Advice | Attending: Emergency Medicine | Admitting: Emergency Medicine

## 2024-01-17 ENCOUNTER — Other Ambulatory Visit: Payer: Self-pay

## 2024-01-17 DIAGNOSIS — G4489 Other headache syndrome: Secondary | ICD-10-CM | POA: Diagnosis not present

## 2024-01-17 DIAGNOSIS — R110 Nausea: Secondary | ICD-10-CM | POA: Diagnosis not present

## 2024-01-17 DIAGNOSIS — Z5321 Procedure and treatment not carried out due to patient leaving prior to being seen by health care provider: Secondary | ICD-10-CM | POA: Diagnosis not present

## 2024-01-17 DIAGNOSIS — R079 Chest pain, unspecified: Secondary | ICD-10-CM | POA: Insufficient documentation

## 2024-01-17 DIAGNOSIS — R42 Dizziness and giddiness: Secondary | ICD-10-CM | POA: Diagnosis not present

## 2024-01-17 DIAGNOSIS — R569 Unspecified convulsions: Secondary | ICD-10-CM | POA: Diagnosis not present

## 2024-01-17 DIAGNOSIS — R519 Headache, unspecified: Secondary | ICD-10-CM | POA: Diagnosis not present

## 2024-01-17 LAB — BASIC METABOLIC PANEL WITH GFR
Anion gap: 12 (ref 5–15)
BUN: 6 mg/dL (ref 6–20)
CO2: 23 mmol/L (ref 22–32)
Calcium: 9.1 mg/dL (ref 8.9–10.3)
Chloride: 104 mmol/L (ref 98–111)
Creatinine, Ser: 1.11 mg/dL (ref 0.61–1.24)
GFR, Estimated: >60 mL/min (ref >60)
Glucose, Bld: 85 mg/dL (ref 70–99)
Potassium: 3.8 mmol/L (ref 3.5–5.1)
Sodium: 139 mmol/L (ref 135–145)

## 2024-01-17 LAB — CBC
HCT: 40.8 % (ref 39.0–52.0)
Hemoglobin: 13.7 g/dL (ref 13.0–17.0)
MCH: 29.1 pg (ref 26.0–34.0)
MCHC: 33.6 g/dL (ref 30.0–36.0)
MCV: 86.6 fL (ref 80.0–100.0)
Platelets: 144 10*3/uL — ABNORMAL LOW (ref 150–400)
RBC: 4.71 MIL/uL (ref 4.22–5.81)
RDW: 11.8 % (ref 11.5–15.5)
WBC: 5.5 10*3/uL (ref 4.0–10.5)
nRBC: 0 % (ref 0.0–0.2)

## 2024-01-17 LAB — TROPONIN I (HIGH SENSITIVITY): Troponin I (High Sensitivity): 3 ng/L (ref <18)

## 2024-01-17 MED ORDER — KETOROLAC TROMETHAMINE 15 MG/ML IJ SOLN
15.0000 mg | Freq: Once | INTRAMUSCULAR | Status: AC
  Administered 2024-01-17: 15 mg via INTRAVENOUS
  Filled 2024-01-17: qty 1

## 2024-01-17 MED ORDER — PROCHLORPERAZINE EDISYLATE 10 MG/2ML IJ SOLN
5.0000 mg | Freq: Once | INTRAMUSCULAR | Status: AC
  Administered 2024-01-17: 5 mg via INTRAVENOUS
  Filled 2024-01-17: qty 2

## 2024-01-17 NOTE — ED Triage Notes (Signed)
 BIB EMS from home. While sitting inside had sudden onset, dizziness, headache and chest wall pain   EMS VS 114/81 80 hr 97% ra Cbg 125

## 2024-01-17 NOTE — ED Notes (Signed)
 Patient decided to leave AMA. Patient taken OTF.

## 2024-01-17 NOTE — ED Provider Triage Note (Signed)
 Emergency Medicine Provider Triage Evaluation Note  Terrence Riley , a 23 y.o. male  was evaluated in triage.  Pt complains of headache and chest pain.  Began 3 days ago.  Some nausea.  Reportedly headache began somewhat acutely..  Review of Systems  Positive: Headache Negative: Fevers  Physical Exam  BP 121/75 (BP Location: Left Arm)   Pulse 76   Temp 98.3 F (36.8 C) (Oral)   Resp 17   Wt 60 kg   SpO2 98%   BMI 17.45 kg/m  Sitting in chair bent over with head and hands.  Medical Decision Making  Medically screening exam initiated at 1:34 PM.  Appropriate orders placed.  Terrence Riley was informed that the remainder of the evaluation will be completed by another provider, this initial triage assessment does not replace that evaluation, and the importance of remaining in the ED until their evaluation is complete.  Patient headache.  Somewhat acute onset.  Per patient just had a headache 3 days ago and does not have episode before.  Discussed with patient's mother who states he has been having headaches now for the last few months but maybe not as severe.  Will get head CT with angiography since did have some acute onset severe headache.  EKG reassuring.  Normal troponin.  Discussed with patient's mom and   Patsey Lot, MD 01/17/24 1335

## 2024-01-19 ENCOUNTER — Telehealth: Payer: Self-pay | Admitting: *Deleted

## 2024-01-19 NOTE — Transitions of Care (Post Inpatient/ED Visit) (Signed)
   01/19/2024  Name: Terrence Riley MRN: [id number] DOB: 11/17/2000  Today's TOC FU Call Status:    Attempted to reach the patient regarding the most recent Inpatient/ED visit.  Follow Up Plan: Additional outreach attempts will be made to reach the patient to complete the Transitions of Care (Post Inpatient/ED visit) call.   Signature Idell [facility]

## 2024-01-21 ENCOUNTER — Telehealth: Payer: Self-pay

## 2024-01-21 NOTE — Transitions of Care (Post Inpatient/ED Visit) (Signed)
   01/21/2024  Name: TOWNES FUHS MRN: [id number] DOB: 2000/08/29  Today's TOC FU Call Status:    Attempted to reach the patient regarding the most recent Inpatient/ED visit.  Follow Up Plan: Additional outreach attempts will be made to reach the patient to complete the Transitions of Care (Post Inpatient/ED visit) call.   Signature: Daesean Lazarz, CMA

## 2024-01-31 ENCOUNTER — Ambulatory Visit: Admitting: Family Medicine

## 2024-02-10 ENCOUNTER — Ambulatory Visit: Admitting: Orthopedic Surgery

## 2024-02-15 ENCOUNTER — Other Ambulatory Visit: Payer: Self-pay

## 2024-02-15 ENCOUNTER — Telehealth: Payer: Self-pay | Admitting: Neurology

## 2024-02-15 ENCOUNTER — Encounter: Payer: Self-pay | Admitting: Neurology

## 2024-02-15 DIAGNOSIS — Z79899 Other long term (current) drug therapy: Secondary | ICD-10-CM | POA: Diagnosis not present

## 2024-02-15 MED ORDER — LEVETIRACETAM 1000 MG PO TABS: ORAL_TABLET | ORAL | 0 refills | Status: DC

## 2024-02-15 NOTE — Telephone Encounter (Signed)
 LMOVM to have the patient call back.    Patient return our call, Pls have him come in for a Keppra  level before he takes his next dose. Depending on level, we may adjust dose. Thanks      Labed patient on the way

## 2024-02-15 NOTE — Telephone Encounter (Signed)
 Pls confirm how he is taking the Keppra , on last visit over a year ago, mother said he is taking 1 in AM, 2 in PM. I would like to check Keppra  level on this dose. Is he also still taking the clonazepam ? Thanks

## 2024-02-15 NOTE — Telephone Encounter (Signed)
 Pt called in stating he thinks he needs to add a medication. He sometimes still has epileptic episodes despite taking his medication. He is also trying to get disability and wants to see if he needs to be seen before forms can be filled out.

## 2024-02-15 NOTE — Telephone Encounter (Signed)
 Pt c/o: seizure Missed medications?  No. Sleep deprived?  Yes.   It hard for him to go to sleep at night.  Alcohol intake?  No. Increased stress? No. Any change in medication color or shape? NO Back to their usual baseline self?  No.. If no, advNo.ise go to ER Current medications prescribed by Dr. Georjean: Keppra  1000 mg     Patient last seen 08/2022 advised patient Dr.Aqunio will require a visit for further refills and forms to be filled out.   After hanging up with the patient io noticed he is scheduled for a follow up 04/20/24.

## 2024-02-15 NOTE — Telephone Encounter (Signed)
 Pls have him come in for a Keppra  level before he takes his next dose. Depending on level, we may adjust dose. Thanks

## 2024-02-16 ENCOUNTER — Telehealth: Payer: Self-pay | Admitting: Neurology

## 2024-02-16 MED ORDER — KEPPRA 1000 MG PO TABS: ORAL_TABLET | ORAL | 0 refills | Status: DC

## 2024-02-16 NOTE — Telephone Encounter (Signed)
 Noted, thanks!

## 2024-02-16 NOTE — Telephone Encounter (Signed)
 Per lab tech Raford she drew labs at 2;25 pm on 02/15/24

## 2024-02-16 NOTE — Telephone Encounter (Signed)
 Pt. Needs brand name Keppra  sent to pharmacy [facility], they keep providing generic , completed Lab work

## 2024-02-18 ENCOUNTER — Other Ambulatory Visit (HOSPITAL_COMMUNITY): Payer: Self-pay

## 2024-02-18 NOTE — Telephone Encounter (Signed)
 I contacted the patient pharmacy that the Keppra  was sent to. They had to order the brand name as it wasn't in stock. The tech I spoke with stated that their order just arrived and they will have the patient medication ready in an hour. I verified twice that it was BRAND name Keppra  and she confirmed it.

## 2024-02-19 LAB — LEVETIRACETAM, IMMUNOASSAY: LEVETIRACETAM, IMMUNOASSAY: 19.6 ug/mL (ref 6.0–46.0)

## 2024-02-19 MED ORDER — KEPPRA 1000 MG PO TABS: ORAL_TABLET | ORAL | 3 refills | Status: DC

## 2024-02-19 NOTE — Addendum Note (Signed)
 Addended by: GEORJEAN DARICE HERO on: 02/19/2024 10:31 PM   Modules accepted: Orders

## 2024-02-21 NOTE — Telephone Encounter (Signed)
**Note De-identified  [place] Obfuscation** Please advise 

## 2024-02-22 ENCOUNTER — Telehealth: Payer: Self-pay | Admitting: Pharmacy Technician

## 2024-02-22 ENCOUNTER — Other Ambulatory Visit (HOSPITAL_COMMUNITY): Payer: Self-pay

## 2024-02-22 NOTE — Telephone Encounter (Signed)
 Pharmacy Patient Advocate Encounter   Received notification from CoverMyMeds that prior authorization for KEPPRA  1000MG  is required/requested.   Insurance verification completed.   The patient is insured through [employer] .   Per test claim: PA required; PA submitted to above mentioned insurance via CoverMyMeds Key/confirmation #/EOC [id number] Status is pending

## 2024-02-22 NOTE — Telephone Encounter (Signed)
 I sent in BRAND Keppra  3 month supply to [facility] mail order on 7/26. Pls let me know what else is needed. Thanks

## 2024-02-22 NOTE — Telephone Encounter (Signed)
 Pharmacy Patient Advocate Encounter  Received notification from [facility] that Prior Authorization for KEPPRA  1000MG  has been DENIED.  Full denial letter will be uploaded to the media tab. See denial reason below.    PA #/Case ID/Reference #: [id number]

## 2024-02-23 ENCOUNTER — Telehealth: Payer: Self-pay | Admitting: Pharmacist

## 2024-02-23 NOTE — Telephone Encounter (Signed)
 E-Appeal has been submitted for brand Keppra . Will advise when response is received or follow up in 1 week. Please be advised that most companies may take 30 days to make a decision. Appeal letter and supporting documentation have been uploaded and submitted via the [facility] website.  Thank you, Devere Pandy, PharmD Clinical Pharmacist  [place]  Direct Dial: [phone]

## 2024-03-10 ENCOUNTER — Other Ambulatory Visit (HOSPITAL_COMMUNITY): Payer: Self-pay

## 2024-03-15 ENCOUNTER — Ambulatory Visit: Payer: Self-pay | Admitting: Orthopedic Surgery

## 2024-03-16 ENCOUNTER — Telehealth: Payer: Self-pay | Admitting: Neurology

## 2024-03-16 MED ORDER — LEVETIRACETAM 1000 MG PO TABS: ORAL_TABLET | ORAL | 3 refills | Status: DC

## 2024-03-16 NOTE — Telephone Encounter (Signed)
 Pls let patient know Rx for generic Keppra  (Levetiracetam ) 1000mg : 1 tab in AM, 2 tabs in PM sent to [facility], thanks

## 2024-03-16 NOTE — Telephone Encounter (Signed)
 Pt. Needs refill of Generic Keppra  sent to [facility]

## 2024-03-20 ENCOUNTER — Other Ambulatory Visit (HOSPITAL_COMMUNITY): Payer: Self-pay

## 2024-03-20 NOTE — Telephone Encounter (Signed)
 Test claim shows that this was filled 8.21 via mail and next fill is 11.16

## 2024-03-21 ENCOUNTER — Encounter: Payer: Self-pay | Admitting: Family Medicine

## 2024-03-21 ENCOUNTER — Ambulatory Visit (INDEPENDENT_AMBULATORY_CARE_PROVIDER_SITE_OTHER): Payer: Self-pay | Admitting: Family Medicine

## 2024-03-21 VITALS — BP 96/60 | HR 62 | Temp 98.2°F | Ht 70.47 in | Wt 124.9 lb

## 2024-03-21 DIAGNOSIS — R634 Abnormal weight loss: Secondary | ICD-10-CM

## 2024-03-21 DIAGNOSIS — J301 Allergic rhinitis due to pollen: Secondary | ICD-10-CM

## 2024-03-21 MED ORDER — CYPROHEPTADINE HCL 4 MG PO TABS: 4.0000 mg | ORAL_TABLET | Freq: Three times a day (TID) | ORAL | 1 refills | Status: DC | PRN

## 2024-03-21 NOTE — Telephone Encounter (Signed)
 Tried calling patient.   Please give us  a call back or send a message back.   If medication was just filled on 8/21 patient should have received 270 tabs per D.Aqunio. Please verify how he taking his medication.

## 2024-03-21 NOTE — Telephone Encounter (Signed)
 Pt called stating he wasn't able to received the medication on the 21st so he is low

## 2024-03-21 NOTE — Progress Notes (Signed)
 Established Patient Office Visit  Subjective   Patient ID: Terrence Riley, male    DOB: 2000-12-17  Age: 23 y.o. MRN: [id number]  Chief Complaint  Patient presents with   Annual Exam    HPI   Terrence Riley has history of migraine headaches, allergic rhinitis, mild intermittent asthma, eosinophilic esophagitis, GERD, seizure disorder, multiple food allergies.  Is seen today with concerns over some fatigue, recent weight loss, and intermittent sweats.  He states his appetite is generally okay.  Does play basketball about an hour per day.  He is consuming 3 meals per day but is somewhat limited in food selections because of his multiple food allergies which include but are not limited to egg, soy, peanut, citrus, apples, dairy  He denies any diarrhea.  No localizing abdominal pain.  No chronic cough.  Mom states that when he was a child and was younger he went through a similar spell and actually responded very well to cyproheptadine .  They would like to consider a similar medication at this time.  He does have some ongoing allergy issues at times.  He has had some intermittent depression symptoms in the past and for a while took mirtazapine  but currently not on any antidepressants.  Denies any severe active depression symptoms but does relate some mild depression currently.  Recent CBC unremarkable except for platelets 144,000.  Recent basic metabolic panel unremarkable.  Has not noted any adenopathy.  Past Medical History:  Diagnosis Date   Allergy    rhinitis   Asthma    as a child   Complication of anesthesia    pt. reports he need more anesthesia with the septoplasty surgery   Eosinophilic esophagitis    heartburn and reflux   Family history of adverse reaction to anesthesia    mother respiratory failure   Headache    Nasal fracture    deviated septum   Pneumonia    1x history of   Premature baby    2 months early   Seizures (HCC)    well controlled on meds/ one 2 months ago  when meds were late 11/2021   Past Surgical History:  Procedure Laterality Date   ADENOIDECTOMY     CLOSED REDUCTION NASAL FRACTURE N/A 08/31/2017   Procedure: CLOSED REDUCTION NASAL FRACTURE;  Surgeon: Blair Mt, MD;  Location: [facility];  Service: ENT;  Laterality: N/A;   HARDWARE REMOVAL Right 09/17/2022   Procedure: HARDWARE REMOVAL;  Surgeon: Addie Cordella Hamilton, MD;  Location: [facility] OR;  Service: Orthopedics;  Laterality: Right;  PROVIDER REQUESTING 2.0 HOURS OPERATING TIME   ORIF HUMERUS FRACTURE Right 05/05/2021   Procedure: reverse Hill-Sachs allografting, biceps tenodesis;  Surgeon: Addie Cordella Hamilton, MD;  Location: [facility] OR;  Service: Orthopedics;  Laterality: Right;   SEPTOPLASTY N/A 08/31/2017   Procedure: SEPTOPLASTY;  Surgeon: Blair Mt, MD;  Location: [facility];  Service: ENT;  Laterality: N/A;   SHOULDER ARTHROSCOPY WITH LABRAL REPAIR Left 04/30/2020   Procedure: LEFT SHOULDER POSTERIOR LABRAL REPAIR WITH ARTHROSCOPY, BICEPS TENDON RELEASE AND TENODESIS, OPEN ALLOGRAFT FOR REVERSE BANKART LESION;  Surgeon: Addie Cordella Hamilton, MD;  Location: [facility] OR;  Service: Orthopedics;  Laterality: Left;   SHOULDER ARTHROSCOPY WITH LABRAL REPAIR Right 05/05/2021   Procedure: right shoulder arthroscopy, biceps release, posterior labral repair;;  Surgeon: Addie Cordella Hamilton, MD;  Location: [facility] OR;  Service: Orthopedics;  Laterality: Right;   SHOULDER ARTHROSCOPY WITH SUBACROMIAL DECOMPRESSION AND BICEP TENDON REPAIR Right 09/17/2022   Procedure: RIGHT SHOULDER  ARTHROSCOPY, SUBSCAPULARIS REPAIR;  Surgeon: Addie Cordella Hamilton, MD;  Location: [facility] OR;  Service: Orthopedics;  Laterality: Right;   TONSILLECTOMY     and addenoids   UPPER GASTROINTESTINAL ENDOSCOPY  05/15/2022    reports that he has never smoked. He has never used smokeless tobacco. He reports that he does not drink alcohol and does not use drugs. family history includes Cancer in his mother; Clotting disorder in  his mother; Protein C deficiency in his sister. Allergies  Allergen Reactions   Lortab Briney.Bull ] Hives   Other Anaphylaxis    Peanuts   Zofran  [Ondansetron  Hcl] Nausea And Vomiting   Citrus Other (See Comments)    Sores in mouth   Dairy Aid [Tilactase] Nausea And Vomiting   Influenza Virus Vaccine Other (See Comments)    Partial paralysis for a couple of weeks per mom   Soy Allergy (Obsolete) Other (See Comments)    Seizures     Review of Systems  Constitutional:  Positive for weight loss. Negative for chills and fever.  Respiratory:  Negative for cough, shortness of breath and wheezing.   Gastrointestinal:  Negative for blood in stool, diarrhea, melena, nausea and vomiting.  Genitourinary:  Negative for hematuria.      Objective:     BP 96/60   Pulse 62   Temp 98.2 F (36.8 C) (Oral)   Ht 5' 10.47 (1.79 m)   Wt 124 lb 14.4 oz (56.7 kg)   SpO2 99%   BMI 17.68 kg/m  BP Readings from Last 3 Encounters:  03/21/24 96/60  01/17/24 121/75  11/30/23 98/66   Wt Readings from Last 3 Encounters:  03/21/24 124 lb 14.4 oz (56.7 kg)  01/17/24 132 lb 4.4 oz (60 kg)  11/30/23 133 lb (60.3 kg)      Physical Exam Vitals reviewed.  Constitutional:      General: He is not in acute distress.    Appearance: He is not ill-appearing.  HENT:     Mouth/Throat:     Mouth: Mucous membranes are moist.     Pharynx: Oropharynx is clear. No oropharyngeal exudate or posterior oropharyngeal erythema.  Cardiovascular:     Rate and Rhythm: Normal rate and regular rhythm.     Heart sounds: No murmur heard. Pulmonary:     Effort: Pulmonary effort is normal.     Breath sounds: Normal breath sounds. No wheezing or rales.  Abdominal:     Palpations: Abdomen is soft. There is no mass.     Tenderness: There is no abdominal tenderness.  Musculoskeletal:     Cervical back: Neck supple.  Lymphadenopathy:     Cervical: No cervical adenopathy.  Neurological:     Mental  Status: He is alert.      No results found for any visits on 03/21/24.  Last CBC Lab Results  Component Value Date   WBC 5.5 01/17/2024   HGB 13.7 01/17/2024   HCT 40.8 01/17/2024   MCV 86.6 01/17/2024   MCH 29.1 01/17/2024   RDW 11.8 01/17/2024   PLT 144 (L) 01/17/2024   Last metabolic panel Lab Results  Component Value Date   GLUCOSE 85 01/17/2024   NA 139 01/17/2024   K 3.8 01/17/2024   CL 104 01/17/2024   CO2 23 01/17/2024   BUN 6 01/17/2024   CREATININE 1.11 01/17/2024   GFRNONAA >60 01/17/2024   CALCIUM 9.1 01/17/2024   PROT 7.6 08/19/2023   ALBUMIN 4.7 08/19/2023   BILITOT 0.7 08/19/2023   ALKPHOS  86 08/19/2023   AST 28 08/19/2023   ALT 24 08/19/2023   ANIONGAP 12 01/17/2024      The ASCVD Risk score (Arnett DK, et al., 2019) failed to calculate for the following reasons:   The 2019 ASCVD risk score is only valid for ages 48 to 20    Assessment & Plan:   Problem List Items Addressed This Visit   None Visit Diagnoses       Weight loss    -  Primary   Relevant Orders   TSH     Patient has had difficulty gaining weight and more recently weight loss.  His weight in May was 133 pounds down to 125 pounds currently.  Does have admittedly high metabolism and plays basketball about an hour per day.  Somewhat limited in food selections because of allergies.  We have recommended consideration for high calorie and protein supplements in the past but has been limited somewhat in which ones he can use.  -Check TSH - Patient would like to consider trial of going back on cyproheptadine  and older antihistamine 4 mg 3 times daily.  Apparently had some weight gain previously with this and tolerated well.  We did review potential side effects including sedation risk. - Set up 1 month to reassess  Return in about 1 month (around 04/21/2024).    Wolm Scarlet, MD

## 2024-03-21 NOTE — Telephone Encounter (Signed)
 Patient brand name medication cost has went up.   Spoke to the patient and his mother the brand works better the generic they will still stay on until his appt with Dr.Aqunio to discuss.  Advise them I can not change it to Generic until the patient has note changed at the pharmacy with [facility].  Mother will call to have it lifted.    Will call pharmacy in the morning to make sure generic can be delivered to patient so he can have medication on hand.

## 2024-03-22 ENCOUNTER — Ambulatory Visit: Payer: Self-pay | Admitting: Family Medicine

## 2024-03-22 LAB — TSH: TSH: 0.52 u[IU]/mL (ref 0.35–5.50)

## 2024-04-04 ENCOUNTER — Telehealth: Payer: Self-pay | Admitting: Neurology

## 2024-04-04 NOTE — Telephone Encounter (Signed)
 Pls ask what is needed on the letter, does it just need to say what the diagnosis is? Or is it for accommodations for longer time for tests? We need to know what he needs to be on the letter

## 2024-04-04 NOTE — Telephone Encounter (Signed)
 Pt. Needs letter regarding disability for school, he would like it faxed [employer] [phone]

## 2024-04-06 ENCOUNTER — Encounter: Payer: Self-pay | Admitting: Neurology

## 2024-04-07 NOTE — Telephone Encounter (Signed)
 Spoke to pt mother--if any Urgent message, please call the office not [facility] message due to not checking often. If pt thoughts of hurting self are worsening, he needs to go to the ER. Pt voiced understanding.

## 2024-04-07 NOTE — Telephone Encounter (Signed)
 Pt mother took 9/17 at 1pm but the mother will wait for your call back

## 2024-04-07 NOTE — Telephone Encounter (Signed)
 LVM-pt mom to call the office back regarding pt.

## 2024-04-10 ENCOUNTER — Encounter: Payer: Self-pay | Admitting: Neurology

## 2024-04-10 NOTE — Telephone Encounter (Signed)
Letter done, thanks.

## 2024-04-12 ENCOUNTER — Encounter: Payer: Self-pay | Admitting: Neurology

## 2024-04-12 ENCOUNTER — Ambulatory Visit (INDEPENDENT_AMBULATORY_CARE_PROVIDER_SITE_OTHER): Admitting: Neurology

## 2024-04-12 VITALS — BP 126/80 | HR 83 | Resp 20 | Ht 72.0 in | Wt 129.0 lb

## 2024-04-12 DIAGNOSIS — G43009 Migraine without aura, not intractable, without status migrainosus: Secondary | ICD-10-CM | POA: Diagnosis not present

## 2024-04-12 DIAGNOSIS — G40209 Localization-related (focal) (partial) symptomatic epilepsy and epileptic syndromes with complex partial seizures, not intractable, without status epilepticus: Secondary | ICD-10-CM

## 2024-04-12 MED ORDER — CLONAZEPAM 0.5 MG PO TABS: ORAL_TABLET | ORAL | 5 refills | Status: DC

## 2024-04-12 MED ORDER — DIVALPROEX SODIUM ER 250 MG PO TB24: ORAL_TABLET | ORAL | 6 refills | Status: AC

## 2024-04-12 MED ORDER — NORTRIPTYLINE HCL 10 MG PO CAPS: 10.0000 mg | ORAL_CAPSULE | Freq: Every day | ORAL | 3 refills | Status: AC
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
## 2024-04-17 ENCOUNTER — Ambulatory Visit (INDEPENDENT_AMBULATORY_CARE_PROVIDER_SITE_OTHER): Admitting: Neurology

## 2024-04-17 DIAGNOSIS — G40209 Localization-related (focal) (partial) symptomatic epilepsy and epileptic syndromes with complex partial seizures, not intractable, without status epilepticus: Secondary | ICD-10-CM | POA: Diagnosis not present

## 2024-04-17 NOTE — Progress Notes (Unsigned)
 EEG complete and ready for review.

## 2024-04-18 ENCOUNTER — Encounter: Payer: Self-pay | Admitting: Neurology

## 2024-04-18 ENCOUNTER — Ambulatory Visit: Payer: BC Managed Care – PPO | Admitting: Neurology

## 2024-04-19 NOTE — Procedures (Signed)
 ELECTROENCEPHALOGRAM REPORT  Date of Study: 04/17/2024  Patient's Name: Terrence Riley MRN: [id number] Date of Birth: 06/17/2001  Referring Provider: Dr. Darice Shivers  Clinical History: This is a 23 year old man with coexisting epilepsy and functional seizures with an increase in seizures. EEG for classification  Medications: Depakote , Levetiracetam , clonazepam   Technical Summary: A multichannel digital EEG recording measured by the international 10-20 system with electrodes applied with paste and impedances below 5000 ohms performed in our laboratory with EKG monitoring in an awake and asleep patient.  Hyperventilation and photic stimulation were performed.  The digital EEG was referentially recorded, reformatted, and digitally filtered in a variety of bipolar and referential montages for optimal display.    Description: The patient is awake and asleep during the recording.  During maximal wakefulness, there is a symmetric, medium voltage 10 Hz posterior dominant rhythm that attenuates with eye opening.  The record is symmetric.  There is an excess amount of diffuse low voltage beta activity seen throughout the recording. During drowsiness and sleep, there is an increase in theta slowing of the background. Vertex waves and symmetric sleep spindles were seen.  Hyperventilation and photic stimulation did not elicit any abnormalities. Around 90 seconds after finishing hyperventilation, he was noted to slightly elevate his right arm flexed at the elbow and kept it in this position. He was unresponsive to staff with eyes closed with right hand in the same position. Staff lifted eyelids and he was staring with no response. Episode lasted around 2 minutes. There was no EEG change seen during this event. There were no epileptiform discharges or electrographic seizures seen.    EKG lead was unremarkable.  Impression: This awake and asleep EEG is normal except for excess amount of diffuse low voltage  beta activity. Episode of unresponsiveness did not show EEG correlate.  Clinical Correlation: Diffuse low voltage beta activity is commonly seen with sedating medications such as benzodiazepines. Episode of unresponsiveness was consistent with a non-epileptic event.  If further clinical questions remain, prolonged EEG may be helpful.  Clinical correlation is advised.   Darice Shivers, M.D.

## 2024-04-21 ENCOUNTER — Other Ambulatory Visit: Payer: Self-pay | Admitting: Family Medicine

## 2024-04-21 ENCOUNTER — Ambulatory Visit (INDEPENDENT_AMBULATORY_CARE_PROVIDER_SITE_OTHER): Payer: Self-pay | Admitting: Family Medicine

## 2024-04-21 ENCOUNTER — Encounter: Payer: Self-pay | Admitting: Family Medicine

## 2024-04-21 VITALS — BP 124/70 | HR 59 | Temp 98.1°F | Wt 131.8 lb

## 2024-04-21 DIAGNOSIS — R634 Abnormal weight loss: Secondary | ICD-10-CM

## 2024-04-21 DIAGNOSIS — R638 Other symptoms and signs concerning food and fluid intake: Secondary | ICD-10-CM | POA: Diagnosis not present

## 2024-04-21 MED ORDER — EPINEPHRINE 0.3 MG/0.3ML IJ SOAJ: 0.3000 mg | INTRAMUSCULAR | 0 refills | Status: AC | PRN

## 2024-04-21 NOTE — Progress Notes (Signed)
 Established Patient Office Visit  Subjective   Patient ID: Terrence Riley, male    DOB: [birth year]  Age: 23 y.o. MRN: [id number]  Chief Complaint  Patient presents with   Medical Management of Chronic Issues    HPI   Terrence Riley is seen for follow-up regarding weight loss.  Last visit his weight had dipped down to 124 pounds but is currently back up today.  We had obtained TSH last visit which came back normal.  He states he has been eating extremely well recently and diligent to try to increase overall calories.  He is very restricted with regard to diet with food allergies including but not limited to peanut, soy, egg, citrus, apples, dairy.  He does relate anaphylaxis type symptoms with certain food allergies in the past.  He states his current EpiPen  may be out of date.  Requesting refill.  He has also been working out more doing more resistance training trying to gain muscle mass.  Recently was switched to generic Keppra  but feels like he is having some side effects.  He has been in touch for follow-up with his neurologist regarding this.  Does relate having a seizure a few weeks ago or so.  Past Medical History:  Diagnosis Date   Allergy    rhinitis   Asthma    as a child   Complication of anesthesia    pt. reports he need more anesthesia with the septoplasty surgery   Eosinophilic esophagitis    heartburn and reflux   Family history of adverse reaction to anesthesia    mother respiratory failure   Headache    Nasal fracture    deviated septum   Pneumonia    1x history of   Premature baby    2 months early   Seizures (HCC)    well controlled on meds/ one 2 months ago when meds were late 11/2021   Past Surgical History:  Procedure Laterality Date   ADENOIDECTOMY     CLOSED REDUCTION NASAL FRACTURE N/A 08/31/2017   Procedure: CLOSED REDUCTION NASAL FRACTURE;  Surgeon: Blair Mt, MD;  Location: [facility];  Service: ENT;  Laterality: N/A;   HARDWARE  REMOVAL Right 09/17/2022   Procedure: HARDWARE REMOVAL;  Surgeon: Addie Cordella Hamilton, MD;  Location: [facility] OR;  Service: Orthopedics;  Laterality: Right;  PROVIDER REQUESTING 2.0 HOURS OPERATING TIME   ORIF HUMERUS FRACTURE Right 05/05/2021   Procedure: reverse Hill-Sachs allografting, biceps tenodesis;  Surgeon: Addie Cordella Hamilton, MD;  Location: [facility] OR;  Service: Orthopedics;  Laterality: Right;   SEPTOPLASTY N/A 08/31/2017   Procedure: SEPTOPLASTY;  Surgeon: Blair Mt, MD;  Location: [facility];  Service: ENT;  Laterality: N/A;   SHOULDER ARTHROSCOPY WITH LABRAL REPAIR Left 04/30/2020   Procedure: LEFT SHOULDER POSTERIOR LABRAL REPAIR WITH ARTHROSCOPY, BICEPS TENDON RELEASE AND TENODESIS, OPEN ALLOGRAFT FOR REVERSE BANKART LESION;  Surgeon: Addie Cordella Hamilton, MD;  Location: [facility] OR;  Service: Orthopedics;  Laterality: Left;   SHOULDER ARTHROSCOPY WITH LABRAL REPAIR Right 05/05/2021   Procedure: right shoulder arthroscopy, biceps release, posterior labral repair;;  Surgeon: Addie Cordella Hamilton, MD;  Location: [facility] OR;  Service: Orthopedics;  Laterality: Right;   SHOULDER ARTHROSCOPY WITH SUBACROMIAL DECOMPRESSION AND BICEP TENDON REPAIR Right 09/17/2022   Procedure: RIGHT SHOULDER ARTHROSCOPY, SUBSCAPULARIS REPAIR;  Surgeon: Addie Cordella Hamilton, MD;  Location: [facility] OR;  Service: Orthopedics;  Laterality: Right;   TONSILLECTOMY     and addenoids   UPPER GASTROINTESTINAL ENDOSCOPY  05/15/2022  reports that he has never smoked. He has never used smokeless tobacco. He reports that he does not drink alcohol and does not use drugs. family history includes Cancer in his mother; Clotting disorder in his mother; Protein C deficiency in his sister. Allergies  Allergen Reactions   Lortab [Hydrocodone-Acetaminophen ] Hives   Other Anaphylaxis    Peanuts   Zofran  [Ondansetron  Hcl] Nausea And Vomiting   Citrus Other (See Comments)    Sores in mouth   Dairy Aid [Tilactase] Nausea And Vomiting    Influenza Virus Vaccine Other (See Comments)    Partial paralysis for a couple of weeks per mom   Soy Allergy (Obsolete) Other (See Comments)    Seizures     Review of Systems  Constitutional:  Negative for chills and fever.  Respiratory:  Negative for shortness of breath.   Cardiovascular:  Negative for chest pain.  Gastrointestinal:  Negative for abdominal pain and diarrhea.  Genitourinary:  Negative for dysuria and hematuria.      Objective:     BP 124/70   Pulse (!) 59   Temp 98.1 F (36.7 C) (Oral)   Wt 131 lb 12.8 oz (59.8 kg)   SpO2 95%   BMI 17.88 kg/m  BP Readings from Last 3 Encounters:  04/21/24 124/70  04/12/24 126/80  03/21/24 96/60   Wt Readings from Last 3 Encounters:  04/21/24 131 lb 12.8 oz (59.8 kg)  04/12/24 129 lb (58.5 kg)  03/21/24 124 lb 14.4 oz (56.7 kg)      Physical Exam Vitals reviewed.  Constitutional:      General: He is not in acute distress.    Appearance: He is not ill-appearing.  Cardiovascular:     Rate and Rhythm: Normal rate and regular rhythm.  Pulmonary:     Effort: Pulmonary effort is normal.     Breath sounds: Normal breath sounds. No wheezing or rales.  Musculoskeletal:     Right lower leg: No edema.     Left lower leg: No edema.  Neurological:     Mental Status: He is alert.      No results found for any visits on 04/21/24.  Last CBC Lab Results  Component Value Date   WBC 5.5 01/17/2024   HGB 13.7 01/17/2024   HCT 40.8 01/17/2024   MCV 86.6 01/17/2024   MCH 29.1 01/17/2024   RDW 11.8 01/17/2024   PLT 144 (L) 01/17/2024   Last metabolic panel Lab Results  Component Value Date   GLUCOSE 85 01/17/2024   NA 139 01/17/2024   K 3.8 01/17/2024   CL 104 01/17/2024   CO2 23 01/17/2024   BUN 6 01/17/2024   CREATININE 1.11 01/17/2024   GFRNONAA >60 01/17/2024   CALCIUM 9.1 01/17/2024   PROT 7.6 08/19/2023   ALBUMIN 4.7 08/19/2023   BILITOT 0.7 08/19/2023   ALKPHOS 86 08/19/2023   AST 28 08/19/2023    ALT 24 08/19/2023   ANIONGAP 12 01/17/2024   Last thyroid  functions Lab Results  Component Value Date   TSH 0.52 03/21/2024      The ASCVD Risk score (Arnett DK, et al., 2019) failed to calculate for the following reasons:   The 2019 ASCVD risk score is only valid for ages 41 to 38    Assessment & Plan:   Weight loss and recent difficulties gaining weight.  Recent TSH normal.  Patient has managed to gain roughly 8 pounds since last visit.  We discussed diet again today in detail.  We recommended he try to get at least 120 to 130 g of protein per day along with resistance training to build more muscle mass.  Continue close follow-up with neurology regarding his seizure medications  Wolm Scarlet, MD

## 2024-05-03 ENCOUNTER — Other Ambulatory Visit: Payer: Self-pay | Admitting: Neurology

## 2024-05-03 MED ORDER — KEPPRA 1000 MG PO TABS: ORAL_TABLET | ORAL | 11 refills | Status: DC

## 2024-05-04 NOTE — Telephone Encounter (Signed)
 Spoke w/ pharmacy stated--$190.00 is the co-pay.

## 2024-05-05 ENCOUNTER — Telehealth: Payer: Self-pay | Admitting: *Deleted

## 2024-05-05 NOTE — Telephone Encounter (Signed)
 Pt called --stated need to update Rx. Called [facility] --notified [facility] called [facility] due writing Rx keppra   name brand pt cost $170.00 out of pocket w/ insurance covered per [facility]. Pt only can take name brand keppra  and pt voiced understanding.

## 2024-05-05 NOTE — Telephone Encounter (Signed)
 [name] called in regarding Rx, he stated that the  pharmacy would need to be contacted.  [phone](fnf)  [phone](eu)

## 2024-05-11 ENCOUNTER — Other Ambulatory Visit: Payer: Self-pay

## 2024-05-11 ENCOUNTER — Telehealth: Payer: Self-pay | Admitting: Neurology

## 2024-05-11 MED ORDER — KEPPRA 1000 MG PO TABS: ORAL_TABLET | ORAL | 11 refills | Status: DC

## 2024-05-11 NOTE — Telephone Encounter (Signed)
 Pt called in this morning. [facility] stated that [facility] [place]  stated that they still have not received his prescription for keppra  . Please call.

## 2024-05-11 NOTE — Telephone Encounter (Signed)
 Refill sent in for pt.

## 2024-05-13 ENCOUNTER — Other Ambulatory Visit: Payer: Self-pay | Admitting: Neurology

## 2024-05-16 ENCOUNTER — Telehealth: Payer: Self-pay | Admitting: Neurology

## 2024-05-16 MED ORDER — CLONAZEPAM 0.5 MG PO TABS: ORAL_TABLET | ORAL | 5 refills | Status: AC

## 2024-05-16 NOTE — Telephone Encounter (Signed)
 Pt called left a voice mail to the RX was sent to the pharmacy

## 2024-05-16 NOTE — Telephone Encounter (Signed)
 Pt. Needs refill of clonazePAM  (KLONOPIN ) 0.5 MG tablet pharmacy denied [facility]ill [facility] [place] please call Pt once completed

## 2024-05-16 NOTE — Telephone Encounter (Signed)
 Rx sent, thanks

## 2024-05-29 ENCOUNTER — Encounter: Payer: Self-pay | Admitting: Radiology

## 2024-06-16 ENCOUNTER — Emergency Department (HOSPITAL_COMMUNITY)

## 2024-06-16 ENCOUNTER — Other Ambulatory Visit: Payer: Self-pay

## 2024-06-16 ENCOUNTER — Emergency Department (HOSPITAL_COMMUNITY)
Admission: EM | Admit: 2024-06-16 | Discharge: 2024-06-16 | Disposition: A | Discharge status: Home / Self Care | Attending: Emergency Medicine | Admitting: Emergency Medicine

## 2024-06-16 DIAGNOSIS — R0789 Other chest pain: Secondary | ICD-10-CM

## 2024-06-16 DIAGNOSIS — R0602 Shortness of breath: Secondary | ICD-10-CM | POA: Insufficient documentation

## 2024-06-16 DIAGNOSIS — R519 Headache, unspecified: Secondary | ICD-10-CM | POA: Insufficient documentation

## 2024-06-16 DIAGNOSIS — R079 Chest pain, unspecified: Secondary | ICD-10-CM | POA: Insufficient documentation

## 2024-06-16 LAB — BASIC METABOLIC PANEL WITH GFR
Anion gap: 9 (ref 5–15)
BUN: 7 mg/dL (ref 6–20)
CO2: 30 mmol/L (ref 22–32)
Calcium: 9.7 mg/dL (ref 8.9–10.3)
Chloride: 103 mmol/L (ref 98–111)
Creatinine, Ser: 1.16 mg/dL (ref 0.61–1.24)
GFR, Estimated: >60 mL/min (ref >60)
Glucose, Bld: 93 mg/dL (ref 70–99)
Potassium: 3.9 mmol/L (ref 3.5–5.1)
Sodium: 142 mmol/L (ref 135–145)

## 2024-06-16 LAB — RESP PANEL BY RT-PCR (RSV, FLU A&B, COVID)  RVPGX2
Influenza A by PCR: NEGATIVE
Influenza B by PCR: NEGATIVE
Resp Syncytial Virus by PCR: NEGATIVE
SARS Coronavirus 2 by RT PCR: NEGATIVE

## 2024-06-16 LAB — CBC
HCT: 43.7 % (ref 39.0–52.0)
Hemoglobin: 14.7 g/dL (ref 13.0–17.0)
MCH: 29.2 pg (ref 26.0–34.0)
MCHC: 33.6 g/dL (ref 30.0–36.0)
MCV: 86.9 fL (ref 80.0–100.0)
Platelets: 161 K/uL (ref 150–400)
RBC: 5.03 MIL/uL (ref 4.22–5.81)
RDW: 11.9 % (ref 11.5–15.5)
WBC: 6.3 K/uL (ref 4.0–10.5)
nRBC: 0 % (ref 0.0–0.2)

## 2024-06-16 LAB — TROPONIN T, HIGH SENSITIVITY
Troponin T High Sensitivity: <15 ng/L (ref 0–19)
Troponin T High Sensitivity: <15 ng/L (ref 0–19)

## 2024-06-16 MED ORDER — ACETAMINOPHEN 500 MG PO TABS
1000.0000 mg | ORAL_TABLET | Freq: Once | ORAL | Status: AC
  Administered 2024-06-16: 1000 mg via ORAL
  Filled 2024-06-16: qty 2

## 2024-06-16 NOTE — ED Provider Notes (Signed)
 [place] EMERGENCY DEPARTMENT AT [facility] Provider Note   CSN: [id number] Arrival date & time: 06/16/24  8040     Patient presents with: Chest Pain and URI   Terrence Riley is a 23 y.o. male.    Patient with history of seizures, on Keppra /Depakote /clonazepam , denies recent seizures, history of GERD -- presents for evaluation of headache and chest pain over the past 3 days.  Patient reports frontal headache described as a pressure.  Also has had pain in the chest, constant, described as a pressure.  Pain is nonexertional.  No cough or fever.  Reports some shortness of breath.  No associated vomiting or diaphoresis.  Patient reports taking ibuprofen  at home.  Patient denies history of hypertension, high cholesterol, diabetes.  He denies tobacco use.  Denies drug use including stimulants.  No known history of heart problems in family members at young age. Patient denies risk factors for pulmonary embolism including: unilateral leg swelling, history of DVT/PE/other blood clots, use of exogenous hormones, recent immobilizations, recent surgery, recent travel (>4hr segment), malignancy, hemoptysis. Patient denies signs of stroke including: facial droop, slurred speech, aphasia, weakness/numbness in extremities, imbalance/trouble walking.        Prior to Admission medications   Medication Sig Start Date End Date Taking? Authorizing Provider  clonazePAM  (KLONOPIN ) 0.5 MG tablet TAKE 1 TABLET BY MOUTH EVERY DAY AT NIGHT 05/16/24   Georjean Darice HERO, MD  divalproex  (DEPAKOTE  ER) 250 MG 24 hr tablet Take 1 tablet every night 04/12/24   Georjean Darice HERO, MD  EPINEPHrine  0.3 mg/0.3 mL IJ SOAJ injection Inject 0.3 mg into the muscle as needed for anaphylaxis. 04/21/24   Burchette, Wolm ORN, MD  ibuprofen  (ADVIL ) 800 MG tablet Take 1 tablet (800 mg total) by mouth 3 (three) times daily. 09/04/23   Jarold Olam HERO, PA-C  KEPPRA  1000 MG tablet Take 1 tablet every morning, 2 tablets every  evening 05/11/24   Georjean Darice HERO, MD  naproxen  (NAPROSYN ) 500 MG tablet Take 1 tablet (500 mg total) by mouth 2 (two) times daily with a meal. 03/31/23   Burchette, Wolm ORN, MD  nortriptyline  (PAMELOR ) 10 MG capsule Take 1 capsule (10 mg total) by mouth at bedtime. 04/12/24   Georjean Darice HERO, MD  sildenafil  (VIAGRA ) 100 MG tablet Take one half to one tablet daily as needed for ED 11/30/23   Micheal Wolm ORN, MD    Allergies: Lortab [hydrocodone-acetaminophen ], Other, Zofran  [ondansetron  hcl], Citrus, Dairy aid [tilactase], Influenza virus vaccine, and Soy allergy (obsolete)    Review of Systems  Updated Vital Signs BP 123/79   Pulse 76   Temp 98.2 F (36.8 C) (Oral)   Resp 15   SpO2 100%   Physical Exam Vitals and nursing note reviewed.  Constitutional:      Appearance: He is well-developed. He is not diaphoretic.  HENT:     Head: Normocephalic and atraumatic.     Right Ear: Tympanic membrane, ear canal and external ear normal.     Left Ear: Tympanic membrane, ear canal and external ear normal.     Nose: Nose normal.     Mouth/Throat:     Mouth: Mucous membranes are not dry.     Pharynx: Uvula midline.  Eyes:     General: Lids are normal.     Conjunctiva/sclera: Conjunctivae normal.     Pupils: Pupils are equal, round, and reactive to light.  Neck:     Vascular: Normal carotid pulses. No carotid bruit  or JVD.     Trachea: Trachea normal. No tracheal deviation.  Cardiovascular:     Rate and Rhythm: Normal rate and regular rhythm.     Pulses: No decreased pulses.          Radial pulses are 2+ on the right side and 2+ on the left side.     Heart sounds: Normal heart sounds, S1 normal and S2 normal. Heart sounds not distant. No murmur heard. Pulmonary:     Effort: Pulmonary effort is normal. No respiratory distress.     Breath sounds: Normal breath sounds. No wheezing.  Chest:     Chest wall: No tenderness.  Abdominal:     General: Bowel sounds are normal.     Palpations:  Abdomen is soft.     Tenderness: There is no abdominal tenderness. There is no guarding or rebound.  Musculoskeletal:        General: Normal range of motion.     Cervical back: Normal range of motion and neck supple. No tenderness or bony tenderness. No muscular tenderness.     Right lower leg: No edema.     Left lower leg: No edema.  Skin:    General: Skin is warm and dry.     Coloration: Skin is not pale.  Neurological:     Mental Status: He is alert and oriented to person, place, and time. Mental status is at baseline.     GCS: GCS eye subscore is 4. GCS verbal subscore is 5. GCS motor subscore is 6.     Cranial Nerves: No cranial nerve deficit.     Sensory: No sensory deficit.     Motor: No abnormal muscle tone.     Coordination: Coordination normal.     Gait: Gait normal.     Deep Tendon Reflexes: Reflexes are normal and symmetric.  Psychiatric:        Mood and Affect: Mood normal.     (all labs ordered are listed, but only abnormal results are displayed) Labs Reviewed  RESP PANEL BY RT-PCR (RSV, FLU A&B, COVID)  [id number]  BASIC METABOLIC PANEL WITH GFR  CBC  TROPONIN T, HIGH SENSITIVITY  TROPONIN T, HIGH SENSITIVITY    ED ECG REPORT   Date: 06/16/2024  Rate: 83  Rhythm: normal sinus rhythm  QRS Axis: normal  Intervals: normal  ST/T Wave abnormalities: nonspecific ST/T changes  Conduction Disutrbances:none  Narrative Interpretation:   Old EKG Reviewed: unchanged from 12/2023  I have personally reviewed the EKG tracing and agree with the computerized printout as noted.   Radiology: DG Chest 2 View Result Date: 06/16/2024 EXAM: 2 VIEW(S) XRAY OF THE CHEST 06/16/2024 09:25:48 PM COMPARISON: Comparison study 09/18/2023. CLINICAL HISTORY: Chest pain and upper respiratory symptoms for 2 days. FINDINGS: LUNGS AND PLEURA: No focal pulmonary opacity. No pleural effusion. No pneumothorax. HEART AND MEDIASTINUM: No acute abnormality of the cardiac and mediastinal  silhouettes. BONES AND SOFT TISSUES: No acute osseous abnormality. IMPRESSION: 1. No acute cardiopulmonary process. Electronically signed by: Oneil Devonshire MD 06/16/2024 09:29 PM EST RP Workstation: [id number]     Procedures   Medications Ordered in the ED  acetaminophen  (TYLENOL ) tablet 1,000 mg (has no administration in time range)    ED Course  Patient seen and examined. History obtained directly from patient. Work-up including labs, imaging, EKG ordered in triage, if performed, were reviewed.    Labs/EKG: Independently reviewed and interpreted.  This included: CBC unremarkable; BMP unremarkable; troponin less than 15; COVID panel was  negative.  EKG reviewed, patient does have nonspecific ST/T changes similar to June.  Some large amplitude QRS, patient healthy young adult with thin chest wall likely contributing.  Second troponin is currently running.  Tylenol   Imaging: Independently visualized and interpreted.  This included: Chest x-ray agree negative.  Medications/Fluids: Ordered: Tylenol    Most recent vital signs reviewed and are as follows: BP 123/79   Pulse 76   Temp 98.2 F (36.8 C) (Oral)   Resp 15   SpO2 100%   Initial impression: Headache without red flags, chest pain that is atypical.  Workup very reassuring to this point.  Vital signs are normal.  Doubt ACS, PE, myocarditis.  Symptoms not consistent with pericarditis.  EKG is abnormal but unchanged from June.  11:38 PM Reassessment performed. Patient appears stable, sitting comfortably on the side of the bed.  Labs personally reviewed and interpreted including: Second troponin negative.  Reviewed pertinent lab work and imaging with patient at bedside. Questions answered.   Most current vital signs reviewed and are as follows: BP 109/67 (BP Location: Left Arm)   Pulse (!) 53   Temp 98.7 F (37.1 C) (Oral)   Resp 17   SpO2 99%   Plan: Discharge to home.   Prescriptions written for: None  Other home care  instructions discussed: Encouraged to continue Tylenol /ibuprofen .  Discussed could trial OTC meds for heartburn or reflux.  ED return instructions discussed: Patient counseled to return if they have weakness in their arms or legs, slurred speech, trouble walking or talking, confusion, trouble with their balance, or if they have any other concerns. Patient verbalizes understanding and agrees with plan.   I encouraged patient to return to ED with severe chest pain, especially if the pain is crushing or pressure-like and spreads to the arms, back, neck, or jaw, or if they have associated sweating, vomiting, or shortness of breath with the pain, or significant pain with activity. We discussed that the evaluation here today indicates a low-risk of serious cause of chest pain, including heart trouble or a blood clot, but no evaluation is perfect and chest pain can evolve with time.    Follow-up instructions discussed: Patient encouraged to follow-up with their PCP in 3 days if not improving.                                   Medical Decision Making Amount and/or Complexity of Data Reviewed Labs: ordered. Radiology: ordered.  Risk OTC drugs.   In regards to the patient's headache, critical differentials were considered including subarachnoid hemorrhage, intracerebral hemorrhage, epidural/subdural hematoma, pituitary apoplexy, vertebral/carotid artery dissection, giant cell arteritis, central venous thrombosis, reversible cerebral vasoconstriction, acute angle closure glaucoma, idiopathic intracranial hypertension, bacterial meningitis, viral encephalitis, carbon monoxide poisoning, posterior reversible encephalopathy syndrome, pre-eclampsia.   Reg flag symptoms related to these causes were considered including systemic symptoms (fever, weight loss), neurologic symptoms (confusion, mental status change, vision change, associated seizure), acute or sudden thunderclap onset, patient age 37 or older  with new or progressive headache, patient of any age with first headache or change in headache pattern, pregnant or postpartum status, history of HIV or other immunocompromise, history of cancer, headache occurring with exertion, associated neck or shoulder pain, associated traumatic injury, concurrent use of anticoagulation, family history of spontaneous SAH, and concurrent drug use.    Other benign, more common causes of headache were considered including migraine, tension-type headache, cluster headache,  referred pain from other cause such as sinus infection, dental pain, trigeminal neuralgia.   On exam, patient has a reassuring neuro exam including baseline mental status, no significant neck pain or meningeal signs, no signs of severe infection or fever.   For this patient's complaint of chest pain, the following emergent conditions were considered on the differential diagnosis: acute coronary syndrome, pulmonary embolism, pneumothorax, myocarditis, pericardial tamponade, aortic dissection, thoracic aortic aneurysm complication, esophageal perforation.   Other causes were also considered including: gastroesophageal reflux disease, musculoskeletal pain including costochondritis, pneumonia/pleurisy, herpes zoster, pericarditis.  In regards to possibility of ACS, patient has atypical features of pain, non-ischemic and unchanged EKG and negative troponin(s). Heart score was calculated to be 1.  EKG some nonspecific changes, but very similar to previous EKGs.  In regards to possibility of PE, symptoms are atypical for PE and patient is PERC negative and chance of PE < 2%.   The patient's vital signs, pertinent lab work and imaging were reviewed and interpreted as discussed in the ED course. Hospitalization was considered for further testing, treatments, or serial exams/observation. However as patient is well-appearing, has a stable exam over the course of their evaluation, and reassuring studies today,  I do not feel that they warrant admission at this time. This plan was discussed with the patient who verbalizes agreement and comfort with this plan and seems reliable and able to return to the Emergency Department with worsening or changing symptoms.       Final diagnoses:  None    ED Discharge Orders     None          Desiderio Chew, PA-C 06/16/24 2341    Charlyn Sora, MD 06/17/24 1437

## 2024-06-16 NOTE — Discharge Instructions (Signed)
 Please read and follow all provided instructions.  Your diagnoses today include:  1. Acute nonintractable headache, unspecified headache type   2. Chest tightness     Tests performed today include: An EKG of your heart: unchanged from previous EKGs A chest x-ray: No signs of problems in the lungs Cardiac enzymes - a blood test for heart muscle damage, were both normal Blood counts and electrolytes: Were all normal Vital signs. See below for your results today.   Medications prescribed:  Please use over-the-counter NSAID medications (ibuprofen , naproxen ) or Tylenol  (acetaminophen ) as directed on the packaging for pain -- as long as you do not have any reasons avoid these medications. Reasons to avoid NSAID medications include: weak kidneys, a history of bleeding in your stomach or gut, or uncontrolled high blood pressure or previous heart attack. Reasons to avoid Tylenol  include: liver problems or ongoing alcohol use. Never take more than 4000mg  or 8 Extra strength Tylenol  in a 24 hour period.     You can consider trying over-the-counter medication for reflux in case this is contributing to your chest tightness.  Take any prescribed medications only as directed.  Follow-up instructions: Please follow-up with your primary care provider as soon as you can for further evaluation of your symptoms.   Return instructions:  SEEK IMMEDIATE MEDICAL ATTENTION IF: You have severe chest pain, especially if the pain is crushing or pressure-like and spreads to the arms, back, neck, or jaw, or if you have sweating, nausea or vomiting, or trouble with breathing. THIS IS AN EMERGENCY. Do not wait to see if the pain will go away. Get medical help at once. Call 911. DO NOT drive yourself to the hospital.  Your chest pain gets worse and does not go away after a few minutes of rest.  You have an attack of chest pain lasting longer than what you usually experience.  You have significant dizziness, if you pass  out, or have trouble walking.  You have chest pain not typical of your usual pain for which you originally saw your caregiver.  You have any other emergent concerns regarding your health.  Additional Information: Chest pain comes from many different causes. Your caregiver has diagnosed you as having chest pain that is not specific for one problem, but does not require admission.  You are at low risk for an acute heart condition or other serious illness.   Your vital signs today were: BP 109/67 (BP Location: Left Arm)   Pulse (!) 53   Temp 98.7 F (37.1 C) (Oral)   Resp 17   SpO2 99%  If your blood pressure (BP) was elevated above 135/85 this visit, please have this repeated by your doctor within one month. --------------

## 2024-06-16 NOTE — ED Triage Notes (Signed)
 Patient c/o Chest pain and URI x 2 days. Patient report increase chest pain when coughing. Patient report chills but denies fever at home. Patient report taking ibuprofen  with no relief. Patient denies N/V/D.

## 2024-06-16 NOTE — ED Notes (Signed)
 Patient d/c with home care instructions.

## 2024-06-26 ENCOUNTER — Encounter: Payer: Self-pay | Admitting: Neurology

## 2024-06-26 ENCOUNTER — Ambulatory Visit: Admitting: Neurology

## 2024-06-27 ENCOUNTER — Encounter: Payer: Self-pay | Admitting: Neurology

## 2024-06-29 ENCOUNTER — Ambulatory Visit: Admitting: Orthopedic Surgery

## 2024-06-29 ENCOUNTER — Encounter: Payer: Self-pay | Admitting: Family Medicine

## 2024-06-29 DIAGNOSIS — G4452 New daily persistent headache (NDPH): Secondary | ICD-10-CM

## 2024-07-05 ENCOUNTER — Ambulatory Visit: Admitting: Orthopedic Surgery

## 2024-07-05 ENCOUNTER — Other Ambulatory Visit (INDEPENDENT_AMBULATORY_CARE_PROVIDER_SITE_OTHER)

## 2024-07-05 ENCOUNTER — Encounter: Payer: Self-pay | Admitting: Orthopedic Surgery

## 2024-07-05 DIAGNOSIS — M545 Low back pain, unspecified: Secondary | ICD-10-CM

## 2024-07-05 NOTE — Progress Notes (Signed)
 Office Visit Note   Patient: Terrence Riley           Date of Birth: 2001/01/22           MRN: [id number] Visit Date: 07/05/2024 Requested by: Micheal Wolm ORN, MD [address] [place],  [place] [address] PCP: Micheal Wolm ORN, MD  Subjective: Chief Complaint  Patient presents with   Lower Back - Pain    HPI: Terrence Riley is a 23 y.o. male who presents to the office reporting low back pain.  Patient describes low back pain for 2 years.  No known history of injury.  Pain wakes him from sleep at night.  Does report radicular pain right worse than left.  Describes numbness and tingling.  Nothing really helps his pain.  Has tried ibuprofen .  Symptoms are worse with standing.  He is okay with coughing and sneezing..                ROS: All systems reviewed are negative as they relate to the chief complaint within the history of present illness.  Patient denies fevers or chills.  Assessment & Plan: Visit Diagnoses:  1. Low back pain, unspecified back pain laterality, unspecified chronicity, unspecified whether sciatica present     Plan: Impression is low back pain in a patient with no spondylolisthesis on radiographs.  I think it is possible he may have either occult facet arthritis or a pars defect but we cannot see on plain radiographs.  Because of duration of symptoms MRI lumbar spine indicated to evaluate for possible pars defect.  Follow-up after that study.  Follow-Up Instructions: No follow-ups on file.   Orders:  Orders Placed This Encounter  Procedures   XR Lumbar Spine 2-3 Views   No orders of the defined types were placed in this encounter.     Procedures: No procedures performed   Clinical Data: No additional findings.  Objective: Vital Signs: There were no vitals taken for this visit.  Physical Exam:  Constitutional: Patient appears well-developed HEENT:  Head: Normocephalic Eyes:EOM are normal Neck: Normal range of motion Cardiovascular:  Normal rate Pulmonary/chest: Effort normal Neurologic: Patient is alert Skin: Skin is warm Psychiatric: Patient has normal mood and affect  Ortho Exam: Patient has bilateral 5 out of 5 ankle dorsiflexion plantarflexion quad hamstring and hip flexion strength.  Bilateral palpable pedal pulses and no paresthesias L1-S1 in either leg.  No nerve root tension signs.  Minimal pain with forward and lateral bending.  No groin pain with internal and external rotation of either leg.  No trochanteric tenderness.  No masses lymphadenopathy or skin changes around the back.   Specialty Comments:  No specialty comments available.  Imaging: XR Lumbar Spine 2-3 Views Result Date: 07/05/2024 AP lateral radiographs lumbar spine reviewed.  Slight postural scoliosis is noted.  No acute fracture or spondylolisthesis.  No significant joint space narrowing is present.    PMFS History: Patient Active Problem List   Diagnosis Date Noted   Traumatic complete tear of left rotator cuff 09/18/2022   Heterotopic ossification of bone 09/18/2022   Hypokalemia 06/26/2021   B12 deficiency 06/26/2021   Rhabdomyolysis 06/26/2021   Acute encephalopathy 06/20/2021   Aspiration pneumonia (HCC) 06/20/2021   Febrile illness    Shoulder instability, right    Humeral head fracture, right, with malunion, subsequent encounter    Esophagitis determined by endoscopy 07/24/2020   Degenerative tear of glenoid labrum of right shoulder 02/13/2020   Shoulder dislocation, left,  subsequent encounter 02/13/2020   Seizure disorder (HCC) 12/22/2019   Migraines 12/22/2019   Mild intermittent asthma 12/22/2019   Migraine without aura and without status migrainosus, not intractable 06/29/2019   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   Altered mental status 03/12/2014   Lethargic 03/09/2014   Environmental allergies 03/01/2014   Gastroesophageal reflux disease with esophagitis 10/06/2013   Localization-related (focal) (partial) symptomatic  epilepsy and epileptic syndromes with complex partial seizures, not intractable, without status epilepticus (HCC) 10/06/2013   Spells 07/26/2013   Allergy to other foods 07/13/2013   Prophylactic immunotherapy 07/13/2013   Unspecified asthma, uncomplicated 07/13/2013   Eosinophilic esophagitis 01/23/2013   Gastroesophageal reflux 01/23/2013   Vomiting 01/23/2013   Leukopenia 10/19/2012   Muscle jerks during sleep 10/19/2012   Constipation 08/20/2012   Neutropenia 08/20/2012   Allergic rhinitis due to pollen 02/19/2011   Past Medical History:  Diagnosis Date   Allergy    rhinitis   Asthma    as a child   Complication of anesthesia    pt. reports he need more anesthesia with the septoplasty surgery   Eosinophilic esophagitis    heartburn and reflux   Family history of adverse reaction to anesthesia    mother respiratory failure   Headache    Nasal fracture    deviated septum   Pneumonia    1x history of   Premature baby    2 months early   Seizures (HCC)    well controlled on meds/ one 2 months ago when meds were late 11/2021    Family History  Problem Relation Age of Onset   Cancer Mother        breat   Clotting disorder Mother    Protein C deficiency Sister    Stomach cancer Neg Hx    Esophageal cancer Neg Hx    Colon cancer Neg Hx     Past Surgical History:  Procedure Laterality Date   ADENOIDECTOMY     CLOSED REDUCTION NASAL FRACTURE N/A 08/31/2017   Procedure: CLOSED REDUCTION NASAL FRACTURE;  Surgeon: Blair Mt, MD;  Location: [facility];  Service: ENT;  Laterality: N/A;   HARDWARE REMOVAL Right 09/17/2022   Procedure: HARDWARE REMOVAL;  Surgeon: Addie Cordella Hamilton, MD;  Location: [facility] OR;  Service: Orthopedics;  Laterality: Right;  PROVIDER REQUESTING 2.0 HOURS OPERATING TIME   ORIF HUMERUS FRACTURE Right 05/05/2021   Procedure: reverse Hill-Sachs allografting, biceps tenodesis;  Surgeon: Addie Cordella Hamilton, MD;  Location: [facility] OR;  Service:  Orthopedics;  Laterality: Right;   SEPTOPLASTY N/A 08/31/2017   Procedure: SEPTOPLASTY;  Surgeon: Blair Mt, MD;  Location: [facility];  Service: ENT;  Laterality: N/A;   SHOULDER ARTHROSCOPY WITH LABRAL REPAIR Left 04/30/2020   Procedure: LEFT SHOULDER POSTERIOR LABRAL REPAIR WITH ARTHROSCOPY, BICEPS TENDON RELEASE AND TENODESIS, OPEN ALLOGRAFT FOR REVERSE BANKART LESION;  Surgeon: Addie Cordella Hamilton, MD;  Location: [facility] OR;  Service: Orthopedics;  Laterality: Left;   SHOULDER ARTHROSCOPY WITH LABRAL REPAIR Right 05/05/2021   Procedure: right shoulder arthroscopy, biceps release, posterior labral repair;;  Surgeon: Addie Cordella Hamilton, MD;  Location: [facility] OR;  Service: Orthopedics;  Laterality: Right;   SHOULDER ARTHROSCOPY WITH SUBACROMIAL DECOMPRESSION AND BICEP TENDON REPAIR Right 09/17/2022   Procedure: RIGHT SHOULDER ARTHROSCOPY, SUBSCAPULARIS REPAIR;  Surgeon: Addie Cordella Hamilton, MD;  Location: [facility] OR;  Service: Orthopedics;  Laterality: Right;   TONSILLECTOMY     and addenoids   UPPER GASTROINTESTINAL ENDOSCOPY  05/15/2022   Social History   Occupational History  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

## 2024-07-11 ENCOUNTER — Encounter: Payer: Self-pay | Admitting: Family Medicine

## 2024-07-12 NOTE — Telephone Encounter (Unsigned)
 Copied from CRM #[id number]. Topic: Clinical - Medication Refill >> Jul 12, 2024 10:44 AM Victoria A wrote: Medication: KEPPRA  1000 MG tablet  Has the patient contacted their pharmacy? No (Agent: If no, request that the patient contact the pharmacy for the refill. If patient does not wish to contact the pharmacy document the reason why and proceed with request.) (Agent: If yes, when and what did the pharmacy advise?)  This is the patient's preferred pharmacy:  [id number] - [place], [place] - [address] [place] [address] Phone: [phone] Fax: [phone]   Is this the correct pharmacy for this prescription? Yes If no, delete pharmacy and type the correct one.   Has the prescription been filled recently? No  Is the patient out of the medication? Yes  Has the patient been seen for an appointment in the last year OR does the patient have an upcoming appointment? Yes  Can we respond through MyChart? Yes  Agent: Please be advised that Rx refills may take up to 3 business days. We ask that you follow-up with your pharmacy.

## 2024-07-13 MED ORDER — KEPPRA 1000 MG PO TABS: ORAL_TABLET | ORAL | 0 refills | Status: AC

## 2024-07-21 ENCOUNTER — Encounter: Payer: Self-pay | Admitting: Orthopedic Surgery

## 2024-07-26 ENCOUNTER — Ambulatory Visit
Admission: RE | Admit: 2024-07-26 | Discharge: 2024-07-26 | Disposition: A | Discharge status: Home / Self Care | Source: Ambulatory Visit | Attending: Orthopedic Surgery | Admitting: Orthopedic Surgery

## 2024-07-26 DIAGNOSIS — M545 Low back pain, unspecified: Secondary | ICD-10-CM
